# Patient Record
Sex: Female | Born: 1985 | Race: White | Hispanic: No | Marital: Married
Health system: Southern US, Academic
[De-identification: ages and names within clinical notes are randomized; demographics above are authoritative.]

## PROBLEM LIST (undated history)

## (undated) ENCOUNTER — Inpatient Hospital Stay: Payer: Self-pay

## (undated) ENCOUNTER — Encounter

## (undated) ENCOUNTER — Encounter: Attending: Family | Primary: Family

## (undated) ENCOUNTER — Ambulatory Visit: Payer: MEDICAID | Attending: Physical Medicine & Rehabilitation | Primary: Physical Medicine & Rehabilitation

## (undated) ENCOUNTER — Encounter: Attending: Surgery | Primary: Surgery

## (undated) ENCOUNTER — Ambulatory Visit: Payer: MEDICAID | Attending: Dermatology | Primary: Dermatology

## (undated) ENCOUNTER — Ambulatory Visit

## (undated) ENCOUNTER — Telehealth

## (undated) ENCOUNTER — Ambulatory Visit: Payer: MEDICAID | Attending: Obesity Medicine | Primary: Obesity Medicine

## (undated) ENCOUNTER — Telehealth: Attending: Dermatology | Primary: Dermatology

## (undated) ENCOUNTER — Encounter: Attending: Obesity Medicine | Primary: Obesity Medicine

## (undated) ENCOUNTER — Telehealth: Attending: Family Medicine | Primary: Family Medicine

## (undated) ENCOUNTER — Telehealth: Attending: Family | Primary: Family

## (undated) ENCOUNTER — Encounter: Attending: Ambulatory Care | Primary: Ambulatory Care

## (undated) ENCOUNTER — Institutional Professional Consult (permissible substitution): Payer: MEDICAID

## (undated) ENCOUNTER — Ambulatory Visit: Payer: MEDICAID

## (undated) ENCOUNTER — Ambulatory Visit
Payer: MEDICAID | Attending: Rehabilitative and Restorative Service Providers" | Primary: Rehabilitative and Restorative Service Providers"

## (undated) ENCOUNTER — Ambulatory Visit: Payer: MEDICAID | Attending: Gastroenterology | Primary: Gastroenterology

## (undated) ENCOUNTER — Inpatient Hospital Stay

## (undated) ENCOUNTER — Telehealth: Attending: Obesity Medicine | Primary: Obesity Medicine

## (undated) ENCOUNTER — Encounter: Attending: Physical Medicine & Rehabilitation | Primary: Physical Medicine & Rehabilitation

## (undated) ENCOUNTER — Ambulatory Visit: Payer: MEDICAID | Attending: Registered" | Primary: Registered"

## (undated) ENCOUNTER — Encounter: Attending: Physician Assistant | Primary: Physician Assistant

## (undated) DIAGNOSIS — I639 Cerebral infarction, unspecified: Secondary | ICD-10-CM

## (undated) DIAGNOSIS — M1990 Unspecified osteoarthritis, unspecified site: Secondary | ICD-10-CM

## (undated) DIAGNOSIS — F4310 Post-traumatic stress disorder, unspecified: Secondary | ICD-10-CM

## (undated) DIAGNOSIS — R51 Headache: Secondary | ICD-10-CM

## (undated) DIAGNOSIS — R0789 Other chest pain: Secondary | ICD-10-CM

## (undated) DIAGNOSIS — R112 Nausea with vomiting, unspecified: Secondary | ICD-10-CM

## (undated) DIAGNOSIS — R002 Palpitations: Secondary | ICD-10-CM

## (undated) DIAGNOSIS — R079 Chest pain, unspecified: Secondary | ICD-10-CM

## (undated) DIAGNOSIS — I493 Ventricular premature depolarization: Secondary | ICD-10-CM

## (undated) DIAGNOSIS — E785 Hyperlipidemia, unspecified: Secondary | ICD-10-CM

## (undated) DIAGNOSIS — M069 Rheumatoid arthritis, unspecified: Secondary | ICD-10-CM

## (undated) DIAGNOSIS — R87619 Unspecified abnormal cytological findings in specimens from cervix uteri: Secondary | ICD-10-CM

## (undated) DIAGNOSIS — R519 Headache, unspecified: Secondary | ICD-10-CM

## (undated) DIAGNOSIS — F4010 Social phobia, unspecified: Secondary | ICD-10-CM

## (undated) DIAGNOSIS — I10 Essential (primary) hypertension: Secondary | ICD-10-CM

## (undated) DIAGNOSIS — M461 Sacroiliitis, not elsewhere classified: Secondary | ICD-10-CM

## (undated) DIAGNOSIS — F319 Bipolar disorder, unspecified: Secondary | ICD-10-CM

## (undated) HISTORY — PX: CHOLECYSTECTOMY: SHX55

## (undated) HISTORY — DX: Rheumatoid arthritis, unspecified: M06.9

## (undated) HISTORY — DX: Hyperlipidemia, unspecified: E78.5

## (undated) HISTORY — DX: Bipolar disorder, unspecified: F31.9

## (undated) HISTORY — DX: Sacroiliitis, not elsewhere classified: M46.1

## (undated) HISTORY — DX: Cerebral infarction, unspecified: I63.9

## (undated) HISTORY — DX: Unspecified abnormal cytological findings in specimens from cervix uteri: R87.619

## (undated) HISTORY — DX: Post-traumatic stress disorder, unspecified: F43.10

## (undated) HISTORY — DX: Essential (primary) hypertension: I10

## (undated) HISTORY — DX: Chest pain, unspecified: R07.9

## (undated) HISTORY — DX: Unspecified osteoarthritis, unspecified site: M19.90

## (undated) HISTORY — DX: Palpitations: R00.2

## (undated) HISTORY — DX: Social phobia, unspecified: F40.10

---
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
## 2007-04-07 ENCOUNTER — Emergency Department: Payer: Self-pay | Admitting: Emergency Medicine

## 2007-06-13 ENCOUNTER — Emergency Department: Payer: Self-pay | Admitting: Emergency Medicine

## 2007-06-14 ENCOUNTER — Other Ambulatory Visit: Payer: Self-pay

## 2007-12-04 ENCOUNTER — Emergency Department: Payer: Self-pay | Admitting: Emergency Medicine

## 2008-03-03 ENCOUNTER — Emergency Department: Payer: Self-pay

## 2008-09-16 ENCOUNTER — Emergency Department: Payer: Self-pay

## 2008-10-22 ENCOUNTER — Emergency Department: Payer: Self-pay | Admitting: Emergency Medicine

## 2008-12-16 ENCOUNTER — Inpatient Hospital Stay: Payer: Self-pay | Admitting: Surgery

## 2010-03-13 ENCOUNTER — Emergency Department: Payer: Self-pay | Admitting: Emergency Medicine

## 2011-07-04 DIAGNOSIS — R8769 Abnormal cytological findings in specimens from other female genital organs: Secondary | ICD-10-CM | POA: Insufficient documentation

## 2011-07-19 ENCOUNTER — Emergency Department: Payer: Self-pay | Admitting: Emergency Medicine

## 2011-07-19 LAB — COMPREHENSIVE METABOLIC PANEL
Anion Gap: 8 (ref 7–16)
BUN: 7 mg/dL (ref 7–18)
Bilirubin,Total: 0.2 mg/dL (ref 0.2–1.0)
Chloride: 105 mmol/L (ref 98–107)
Creatinine: 0.6 mg/dL (ref 0.60–1.30)
EGFR (African American): >60
Osmolality: 277 (ref 275–301)
Potassium: 4.3 mmol/L (ref 3.5–5.1)
SGPT (ALT): 29 U/L
Sodium: 140 mmol/L (ref 136–145)
Total Protein: 7 g/dL (ref 6.4–8.2)

## 2011-07-19 LAB — CBC WITH DIFFERENTIAL/PLATELET
Basophil #: 0.1 10*3/uL (ref 0.0–0.1)
Basophil %: 0.4 %
HCT: 39.9 % (ref 35.0–47.0)
HGB: 13.6 g/dL (ref 12.0–16.0)
Lymphocyte #: 2.8 10*3/uL (ref 1.0–3.6)
MCH: 29.5 pg (ref 26.0–34.0)
MCHC: 34.2 g/dL (ref 32.0–36.0)
MCV: 86 fL (ref 80–100)
Monocyte %: 7.7 %
Neutrophil #: 10 10*3/uL — ABNORMAL HIGH (ref 1.4–6.5)
Platelet: 368 10*3/uL (ref 150–440)
RDW: 13.3 % (ref 11.5–14.5)

## 2011-07-20 LAB — WET PREP, GENITAL

## 2011-07-20 LAB — HCG, QUANTITATIVE, PREGNANCY: Beta Hcg, Quant.: 21189 m[IU]/mL — ABNORMAL HIGH

## 2012-11-14 LAB — HM PAP SMEAR

## 2014-07-28 ENCOUNTER — Emergency Department: Payer: Self-pay | Admitting: Emergency Medicine

## 2014-08-15 ENCOUNTER — Emergency Department: Payer: Self-pay | Admitting: Emergency Medicine

## 2015-01-04 ENCOUNTER — Ambulatory Visit (INDEPENDENT_AMBULATORY_CARE_PROVIDER_SITE_OTHER): Payer: 59 | Admitting: Family Medicine

## 2015-01-04 ENCOUNTER — Encounter: Payer: Self-pay | Admitting: Family Medicine

## 2015-01-04 VITALS — BP 112/72 | HR 80 | Temp 98.2°F | Resp 16 | Ht <= 58 in | Wt 256.0 lb

## 2015-01-04 DIAGNOSIS — J0110 Acute frontal sinusitis, unspecified: Secondary | ICD-10-CM | POA: Insufficient documentation

## 2015-01-04 DIAGNOSIS — J029 Acute pharyngitis, unspecified: Secondary | ICD-10-CM | POA: Insufficient documentation

## 2015-01-04 DIAGNOSIS — N912 Amenorrhea, unspecified: Secondary | ICD-10-CM | POA: Insufficient documentation

## 2015-01-04 DIAGNOSIS — J399 Disease of upper respiratory tract, unspecified: Secondary | ICD-10-CM | POA: Insufficient documentation

## 2015-01-04 DIAGNOSIS — J40 Bronchitis, not specified as acute or chronic: Secondary | ICD-10-CM

## 2015-01-04 DIAGNOSIS — D509 Iron deficiency anemia, unspecified: Secondary | ICD-10-CM | POA: Insufficient documentation

## 2015-01-04 DIAGNOSIS — G43909 Migraine, unspecified, not intractable, without status migrainosus: Secondary | ICD-10-CM | POA: Insufficient documentation

## 2015-01-04 DIAGNOSIS — Z3490 Encounter for supervision of normal pregnancy, unspecified, unspecified trimester: Secondary | ICD-10-CM

## 2015-01-04 DIAGNOSIS — H6120 Impacted cerumen, unspecified ear: Secondary | ICD-10-CM | POA: Insufficient documentation

## 2015-01-04 MED ORDER — ALBUTEROL SULFATE HFA 108 (90 BASE) MCG/ACT IN AERS: 2.0000 | INHALATION_SPRAY | Freq: Four times a day (QID) | RESPIRATORY_TRACT | Status: DC | PRN

## 2015-01-04 MED ORDER — AMOXICILLIN-POT CLAVULANATE 875-125 MG PO TABS: 1.0000 | ORAL_TABLET | Freq: Two times a day (BID) | ORAL | Status: DC

## 2015-01-04 NOTE — Progress Notes (Signed)
Subjective:    Patient ID: Leah Osborne, female    DOB: 10-Dec-1985, 29 y.o.   MRN: [id number]  HPI: Leah Osborne is a 29 y.o. female presenting on 01/04/2015 for Cough   HPI Pt presents for 7 days worth of upper respiratory symptoms. Symptoms include coughing and wheezing at home. Pt has not been using home treatment due to current pregnancy.    No past medical history on file.  No current outpatient prescriptions on file prior to visit.   No current facility-administered medications on file prior to visit.    Review of Systems  Constitutional: Negative for fever and chills.  HENT: Positive for sore throat. Negative for congestion, ear discharge, ear pain, rhinorrhea and sinus pressure.   Respiratory: Positive for cough, chest tightness and wheezing.   Cardiovascular: Negative for chest pain, palpitations and leg swelling.  Gastrointestinal: Negative.   Neurological: Negative.    Per HPI unless specifically indicated above     Objective:    BP 112/72 mmHg  Pulse 80  Temp(Src) 98.2 F (36.8 C) (Oral)  Resp 16  Ht '4\' 9"'  (1.448 m)  Wt 256 lb (116.121 kg)  BMI 55.38 kg/m2  SpO2 99%  Wt Readings from Last 3 Encounters:  01/04/15 256 lb (116.121 kg)    Physical Exam  Constitutional: She appears well-developed and well-nourished. No distress.  HENT:  Head: Normocephalic.  Right Ear: Hearing and tympanic membrane normal. Tympanic membrane is not erythematous and not bulging.  Left Ear: Hearing and tympanic membrane normal. Tympanic membrane is not erythematous and not bulging.  Nose: Mucosal edema present. No rhinorrhea, sinus tenderness or nasal septal hematoma. Right sinus exhibits no maxillary sinus tenderness and no frontal sinus tenderness. Left sinus exhibits no maxillary sinus tenderness and no frontal sinus tenderness.  Mouth/Throat: Uvula is midline and mucous membranes are normal. No uvula swelling. Posterior oropharyngeal erythema present. No posterior  oropharyngeal edema.  Neck: Neck supple. No Brudzinski's sign and no Kernig's sign noted.  Cardiovascular: Normal rate, regular rhythm and normal heart sounds.   Pulmonary/Chest: No accessory muscle usage. No tachypnea. No respiratory distress. She has wheezes in the right middle field, the right lower field, the left middle field and the left lower field. Chest wall is not dull to percussion. She exhibits no tenderness.  Lymphadenopathy:    She has no cervical adenopathy.   Results for orders placed or performed in visit on 07/19/11  Wet prep, genital  Result Value Ref Range   Micro Text Report         COMMENT                   MODERATE WHITE BLOOD CELLS SEEN   COMMENT                   MODERATE CLUE CELLS SEEN   COMMENT                   NO TRICHOMONAS SEEN   COMMENT                   NO SPERMATOZOA SEEN   COMMENT                   NO YEAST SEEN   ANTIBIOTIC  CBC with Differential/Platelet  Result Value Ref Range   WBC 14.1 (H) 3.6-11.0 x10 3/mm 3   RBC 4.63 3.80-5.20 X10 6/mm 3   HGB 13.6 12.0-16.0 g/dL   HCT 39.9 35.0-47.0 %   MCV 86 80-100 fL   MCH 29.5 26.0-34.0 pg   MCHC 34.2 32.0-36.0 g/dL   RDW 13.3 11.5-14.5 %   Platelet 368 150-440 x10 3/mm 3   Neutrophil % 70.9 %   Lymphocyte % 20.2 %   Monocyte % 7.7 %   Eosinophil % 0.8 %   Basophil % 0.4 %   Neutrophil # 10.0 (H) 1.4-6.5 x10 3/mm 3   Lymphocyte # 2.8 1.0-3.6 x10 3/mm 3   Monocyte # 1.1 (H) 0.0-0.7 x10 3/mm 3   Eosinophil # 0.1 0.0-0.7 x10 3/mm 3   Basophil # 0.1 0.0-0.1 x10 3/mm 3  Comprehensive metabolic panel  Result Value Ref Range   Glucose 84 65-99 mg/dL   BUN 7 7-18 mg/dL   Creatinine 0.60 0.60-1.30 mg/dL   Sodium 140 136-145 mmol/L   Potassium 4.3 3.5-5.1 mmol/L   Chloride 105 98-107 mmol/L   Co2 27 21-32 mmol/L   Calcium, Total 9.6 8.5-10.1 mg/dL   SGOT(AST) 19 15-37 Unit/L   SGPT (ALT) 29 U/L   Alkaline Phosphatase 63 50-136 Unit/L    Albumin 3.2 (L) 3.4-5.0 g/dL   Total Protein 7.0 6.4-8.2 g/dL   Bilirubin,Total 0.2 0.2-1.0 mg/dL   Osmolality 277 275-301   Anion Gap 8 7-16   EGFR (African American) >60 >82m/min   EGFR (Non-African Amer.) >60 >657mmin  hCG, quantitative, pregnancy  Result Value Ref Range   Beta Hcg, Quant. 21189 (H) mIU/mL      Assessment & Plan:   Problem List Items Addressed This Visit      Other   Currently pregnant    Other Visit Diagnoses    Bronchitis    -  Primary    Given current wheezing and duration of symptoms treat with augmentin. Category B in pregnancy. Albuterol inhaler for wheezing and chest tightness. Outlined possible pregnancy risk vs benefits. Encourage pt to discuss any OTC medications with her OB.      Relevant Medications    amoxicillin-clavulanate (AUGMENTIN) 875-125 MG per tablet    albuterol (PROVENTIL HFA;VENTOLIN HFA) 108 (90 BASE) MCG/ACT inhaler       Meds ordered this encounter  Medications  . Prenatal Vit-Fe Fumarate-FA (PRENATAL MULTIVITAMIN) TABS tablet    Sig: Take 1 tablet by mouth daily at 12 noon.  . Marland Kitchenmoxicillin-clavulanate (AUGMENTIN) 875-125 MG per tablet    Sig: Take 1 tablet by mouth 2 (two) times daily.    Dispense:  14 tablet    Refill:  0    Order Specific Question:  Supervising Provider    Answer:  HAArlis [phone]. albuterol (PROVENTIL HFA;VENTOLIN HFA) 108 (90 BASE) MCG/ACT inhaler    Sig: Inhale 2 puffs into the lungs every 6 (six) hours as needed for wheezing or shortness of breath.    Dispense:  1 Inhaler    Refill:  0    Order Specific Question:  Supervising Provider    Answer:  HAArlis [phone][[id number]]    Follow up plan: Return if symptoms worsen or fail to improve.

## 2015-01-04 NOTE — Patient Instructions (Signed)

## 2015-02-15 ENCOUNTER — Observation Stay
Admission: EM | Admit: 2015-02-15 | Discharge: 2015-02-15 | Disposition: A | Discharge status: Home / Self Care | Payer: Medicaid Other | Attending: Obstetrics and Gynecology | Admitting: Obstetrics and Gynecology

## 2015-02-15 DIAGNOSIS — O368120 Decreased fetal movements, second trimester, not applicable or unspecified: Principal | ICD-10-CM | POA: Insufficient documentation

## 2015-02-15 DIAGNOSIS — O368190 Decreased fetal movements, unspecified trimester, not applicable or unspecified: Secondary | ICD-10-CM | POA: Diagnosis present

## 2015-02-15 NOTE — OB Triage Note (Signed)
Pt here due to decreased fetal movement. Pt receives prenatal care at [facility]. Pt here for reassurance

## 2015-02-15 NOTE — Plan of Care (Signed)
MONITOR STRIP REVIEWED BY C GUTIERREZ,CNM. PT NOW FEELING FETAL MOVEMENT. PT D/C HOME WITH D/C INSTRUCTIONS AND REASSURANCE

## 2015-04-08 ENCOUNTER — Encounter: Payer: Self-pay | Admitting: *Deleted

## 2015-04-08 ENCOUNTER — Observation Stay
Admission: EM | Admit: 2015-04-08 | Discharge: 2015-04-08 | Disposition: A | Discharge status: Home / Self Care | Payer: Medicaid Other | Attending: Obstetrics and Gynecology | Admitting: Obstetrics and Gynecology

## 2015-04-08 DIAGNOSIS — O26899 Other specified pregnancy related conditions, unspecified trimester: Secondary | ICD-10-CM | POA: Diagnosis present

## 2015-04-08 DIAGNOSIS — R42 Dizziness and giddiness: Secondary | ICD-10-CM | POA: Diagnosis not present

## 2015-04-08 LAB — HEPATIC FUNCTION PANEL
ALK PHOS: 77 U/L (ref 38–126)
ALT: 18 U/L (ref 14–54)
AST: 15 U/L (ref 15–41)
Albumin: 3.1 g/dL — ABNORMAL LOW (ref 3.5–5.0)
BILIRUBIN TOTAL: 0.2 mg/dL — AB (ref 0.3–1.2)
Bilirubin, Direct: <0.1 mg/dL — ABNORMAL LOW (ref 0.1–0.5)
Total Protein: 6.6 g/dL (ref 6.5–8.1)

## 2015-04-08 LAB — BASIC METABOLIC PANEL
Anion gap: 8 (ref 5–15)
BUN: 8 mg/dL (ref 6–20)
CALCIUM: 8.8 mg/dL — AB (ref 8.9–10.3)
CHLORIDE: 104 mmol/L (ref 101–111)
CO2: 24 mmol/L (ref 22–32)
CREATININE: 0.61 mg/dL (ref 0.44–1.00)
GFR calc non Af Amer: >60 mL/min (ref >60)
GLUCOSE: 80 mg/dL (ref 65–99)
Potassium: 4.3 mmol/L (ref 3.5–5.1)
Sodium: 136 mmol/L (ref 135–145)

## 2015-04-08 LAB — PROTEIN / CREATININE RATIO, URINE
CREATININE, URINE: 92 mg/dL
Protein Creatinine Ratio: 0.08 mg/mg{Cre} (ref 0.00–0.15)
Total Protein, Urine: 7 mg/dL

## 2015-04-08 LAB — CBC
HCT: 35.7 % (ref 35.0–47.0)
Hemoglobin: 11.8 g/dL — ABNORMAL LOW (ref 12.0–16.0)
MCH: 28.4 pg (ref 26.0–34.0)
MCHC: 33 g/dL (ref 32.0–36.0)
MCV: 86 fL (ref 80.0–100.0)
PLATELETS: 347 10*3/uL (ref 150–440)
RBC: 4.15 MIL/uL (ref 3.80–5.20)
RDW: 13.5 % (ref 11.5–14.5)
WBC: 11.2 10*3/uL — ABNORMAL HIGH (ref 3.6–11.0)

## 2015-04-08 NOTE — Discharge Instructions (Signed)
Drink plenty of fluid and get plenty of rest. Call your provider for any other concerns °

## 2015-04-08 NOTE — Discharge Summary (Signed)
Patient discharged home, discharge instructions given, patient states understanding. Patient left floor in stable condition, denies any other needs at this time. Patient to schedule follow up next week.

## 2015-04-08 NOTE — OB Triage Note (Signed)
Patient complains on dizziness and sudden increase in HA starting this AM

## 2015-08-01 ENCOUNTER — Encounter: Payer: Self-pay | Admitting: Family Medicine

## 2015-08-01 ENCOUNTER — Ambulatory Visit (INDEPENDENT_AMBULATORY_CARE_PROVIDER_SITE_OTHER): Payer: Medicaid Other | Admitting: Family Medicine

## 2015-08-01 VITALS — BP 135/84 | HR 69 | Temp 97.6°F | Resp 16 | Ht <= 58 in | Wt 275.0 lb

## 2015-08-01 DIAGNOSIS — Z3009 Encounter for other general counseling and advice on contraception: Secondary | ICD-10-CM | POA: Diagnosis not present

## 2015-08-01 NOTE — Progress Notes (Signed)
Subjective:    Patient ID: Leah Osborne, female    DOB: [birth year], 30 y.o.   MRN: [id number]  HPI: Leah Osborne is a 30 y.o. female presenting on 08/01/2015 for Pre-op Exam   HPI  Pt presents for referral to GYN. Desires tubal ligation. Recently delivered at [facility]. 7 weeks post-partum. Not breastfeeding. Just had LMP- 07/25/2014. First since birth.    No past medical history on file.  Current Outpatient Prescriptions on File Prior to Visit  Medication Sig  . albuterol (PROVENTIL HFA;VENTOLIN HFA) 108 (90 BASE) MCG/ACT inhaler Inhale 2 puffs into the lungs every 6 (six) hours as needed for wheezing or shortness of breath.   No current facility-administered medications on file prior to visit.    Review of Systems  Constitutional: Negative for fever and chills.  HENT: Negative.   Respiratory: Negative for cough, chest tightness and wheezing.   Cardiovascular: Negative for chest pain and leg swelling.  Gastrointestinal: Negative for nausea, vomiting, abdominal pain, diarrhea and constipation.  Endocrine: Negative.  Negative for cold intolerance, heat intolerance, polydipsia, polyphagia and polyuria.  Genitourinary: Negative for dysuria and difficulty urinating.  Musculoskeletal: Negative.   Neurological: Negative for dizziness, light-headedness and numbness.  Psychiatric/Behavioral: Negative.    Per HPI unless specifically indicated above     Objective:    BP 135/84 mmHg  Pulse 69  Temp(Src) 97.6 F (36.4 C) (Oral)  Resp 16  Ht 4\' 9"  (1.448 m)  Wt 275 lb (124.739 kg)  BMI 59.49 kg/m2  LMP 07/25/2014  Breastfeeding? No  Wt Readings from Last 3 Encounters:  08/01/15 275 lb (124.739 kg)  01/04/15 256 lb (116.121 kg)    Physical Exam  Constitutional: She is oriented to person, place, and time. She appears well-developed and well-nourished.  HENT:  Head: Normocephalic and atraumatic.  Neck: Neck supple.  Cardiovascular: Normal rate, regular rhythm and normal heart  sounds.  Exam reveals no gallop and no friction rub.   No murmur heard. Pulmonary/Chest: Effort normal and breath sounds normal. She has no wheezes. She exhibits no tenderness.  Abdominal: Soft. Normal appearance and bowel sounds are normal. She exhibits no distension and no mass. There is no tenderness. There is no rebound and no guarding.  Musculoskeletal: Normal range of motion. She exhibits no edema or tenderness.  Lymphadenopathy:    She has no cervical adenopathy.  Neurological: She is alert and oriented to person, place, and time.  Skin: Skin is warm and dry.   Results for orders placed or performed during the hospital encounter of [id number]  Basic metabolic panel  Result Value Ref Range   Sodium 136 135 - 145 mmol/L   Potassium 4.3 3.5 - 5.1 mmol/L   Chloride 104 101 - 111 mmol/L   CO2 24 22 - 32 mmol/L   Glucose, Bld 80 65 - 99 mg/dL   BUN 8 6 - 20 mg/dL   Creatinine, Ser 0.61 0.44 - 1.00 mg/dL   Calcium 8.8 (L) 8.9 - 10.3 mg/dL   GFR calc non Af Amer >60 >60 mL/min   GFR calc Af Amer >60 >60 mL/min   Anion gap 8 5 - 15  CBC  Result Value Ref Range   WBC 11.2 (H) 3.6 - 11.0 K/uL   RBC 4.15 3.80 - 5.20 MIL/uL   Hemoglobin 11.8 (L) 12.0 - 16.0 g/dL   HCT 35.7 35.0 - 47.0 %   MCV 86.0 80.0 - 100.0 fL   MCH 28.4 26.0 - 34.0 pg  MCHC 33.0 32.0 - 36.0 g/dL   RDW 13.5 11.5 - 14.5 %   Platelets 347 150 - 440 K/uL  Protein / creatinine ratio, urine  Result Value Ref Range   Creatinine, Urine 92 mg/dL   Total Protein, Urine 7 mg/dL   Protein Creatinine Ratio 0.08 0.00 - 0.15 mg/mg[Cre]  Hepatic function panel  Result Value Ref Range   Total Protein 6.6 6.5 - 8.1 g/dL   Albumin 3.1 (L) 3.5 - 5.0 g/dL   AST 15 15 - 41 U/L   ALT 18 14 - 54 U/L   Alkaline Phosphatase 77 38 - 126 U/L   Total Bilirubin 0.2 (L) 0.3 - 1.2 mg/dL   Bilirubin, Direct <0.1 (L) 0.1 - 0.5 mg/dL   Indirect Bilirubin NOT CALCULATED 0.3 - 0.9 mg/dL      Assessment & Plan:   Problem List Items  Addressed This Visit    None    Visit Diagnoses    General counseling and advice on contraceptive management    -  Primary    Referral to GYN for desired tubal ligation.     Relevant Orders    Ambulatory referral to Obstetrics / Gynecology       Meds ordered this encounter  Medications  . ibuprofen (ADVIL,MOTRIN) 600 MG tablet    Sig: Take 600 mg by mouth.      Follow up plan: Return if symptoms worsen or fail to improve.

## 2015-08-01 NOTE — Patient Instructions (Signed)
Laparoscopic Tubal Ligation Laparoscopic tubal ligation is a procedure that closes the fallopian tubes at a time other than right after childbirth. When the fallopian tubes are closed, the eggs that are released from the ovaries cannot enter the uterus, and sperm cannot reach the egg. Tubal ligation is also known as getting your "tubes tied." Tubal ligation is done so you will not be able to get pregnant or have a baby. Although this procedure may be undone (reversed), it should be considered permanent and irreversible. If you want to have future pregnancies, you should not have this procedure. LET [facility] CARE PROVIDER KNOW ABOUT:  Any allergies you have.  All medicines you are taking, including vitamins, herbs, eye drops, creams, and over-the-counter medicines. This includes any use of steroids, either by mouth or in cream form.  Previous problems you or members of your family have had with the use of anesthetics.  Any blood disorders you have.  Previous surgeries you have had.  Any medical conditions you may have.  Possibility of pregnancy, if this applies.  Any past pregnancies. RISKS AND COMPLICATIONS  Infection.  Bleeding.  Injury to surrounding organs.  Side effects from anesthetics.  Failure of the procedure.  Ectopic pregnancy.  Future regret about having the procedure done. BEFORE THE PROCEDURE  Ask your health care provider about:  Changing or stopping your regular medicines. This is especially important if you are taking diabetes medicines or blood thinners.  Taking medicines such as aspirin and ibuprofen. These medicines can thin your blood. Do not take these medicines before your procedure if your health care provider instructs you not to.  Follow instructions from your health care provider about eating and drinking restrictions.  Plan to have someone take you home after the procedure.  If you go home right after the procedure, plan to have someone  with you for 24 hours. PROCEDURE  You will be given one or more of the following:  A medicine that helps you relax (sedative).  A medicine that numbs the area (local anesthetic).  A medicine that makes you fall asleep (general anesthetic).  A medicine that is injected into an area of your body that numbs everything below the injection site (regional anesthetic).  If you have been given general anesthetic, a tube will be put down your throat to help you breathe.  Two small cuts (incisions) will be made in the lower abdominal area and near the belly button.  Your bladder may be emptied with a small tube (catheter).  Your abdomen will be inflated with a safe gas (carbon dioxide). This will help to give the surgeon room to operate and visualize, and it will help the surgeon to avoid other organs.  A thin, lighted tube (laparoscope) with a camera attached will be inserted into your abdomen through one of the incisions near the belly button. Other small instruments will be inserted through the other abdominal incision.  The fallopian tubes will be tied off or burned (cauterized), or they will be blocked with a clip, ring, or clamp. In many cases, a small portion in the center of each fallopian tube will also be removed.  After the fallopian tubes are blocked, the gas will be released from the abdomen.  The incisions will be closed with stitches (sutures).  A bandage (dressing) will be placed over the incisions. The procedure may vary among health care providers and hospitals. AFTER THE PROCEDURE  Your blood pressure, heart rate, breathing rate, and blood oxygen level  will be monitored often until the medicines you were given have worn off.  You will be given pain medicine as needed.  If you had general anesthetic, you may have some mild discomfort in your throat. This is from the breathing tube that was placed in your throat while you were sleeping.  You may experience discomfort in  the shoulder area from some trapped air between your liver and your diaphragm. This sensation is normal, and it will slowly go away on its own.  You will have some mild abdominal discomfort for 3--7 days.   This information is not intended to replace advice given to you by your health care provider. Make sure you discuss any questions you have with your health care provider.   Document Released: 09/10/2000 Document Revised: 10/19/2014 Document Reviewed: 09/15/2011 Elsevier Interactive Patient Education [employer].

## 2015-08-23 ENCOUNTER — Ambulatory Visit (INDEPENDENT_AMBULATORY_CARE_PROVIDER_SITE_OTHER): Payer: Medicaid Other | Admitting: Obstetrics and Gynecology

## 2015-08-23 ENCOUNTER — Encounter: Payer: Self-pay | Admitting: Obstetrics and Gynecology

## 2015-08-23 VITALS — BP 134/82 | HR 84 | Ht 60.0 in | Wt 269.7 lb

## 2015-08-23 DIAGNOSIS — Z302 Encounter for sterilization: Secondary | ICD-10-CM | POA: Diagnosis not present

## 2015-08-23 NOTE — Progress Notes (Signed)
GYN ENCOUNTER NOTE  Subjective:       Leah Osborne is a 30 y.o. G62P2002 female is here for gynecologic evaluation of the following issues:  1. Permanent sterilization  The patient is a 89 year old infant engaged, white, female, para 2002, who presents for consultation regarding  Surgical sterilization.  Patient had previously been scheduled for bilateral salpingectomy at [facility] for: Procedure was discontinued because of uncertain indications.-Provider reportedly had no opening for her surgery.  Past obstetric history: Para 2002. G1-SVD, 5 lbs. 14 oz.;; preeclampsia complication. G2 SVD 8 lbs. 1 oz.; no complications.  (Different father).  .     Gynecologic History Patient's last menstrual period was 07/26/2015 (approximate). Menarche-age 23. Cycles regular on a monthly basis. Duration of flow 5-7 days, moderate bleeding. History of severe dysmenorrhea; pain management with ibuprofen 600 mg per day; patient has missed work and school in the past because of severe cramps. Dysmenorrhea is characterized as central cramping along with low back pain without radiation. No history of deep thrusting dyspareunia. Family history of sister with severe dysmenorrhea without diagnosis of endometriosis. History of abnormal Pap smear No STI history  Obstetric History OB History  Gravida Para Term Preterm AB SAB TAB Ectopic Multiple Living  2 2 2  0 0 0 0 0 0 2    # Outcome Date GA Lbr Len/2nd Weight Sex Delivery Anes PTL Lv  2 Term 2016   8 lb 1.6 oz (3.674 kg) F Vag-Spont   Y  1 Term 2013   5 lb 2.2 oz (2.331 kg) M Vag-Spont   Y      Past Medical History  Diagnosis Date  . Abnormal Pap smear of cervix     Past Surgical History  Procedure Laterality Date  . Cholecystectomy      Current Outpatient Prescriptions on File Prior to Visit  Medication Sig Dispense Refill  . ibuprofen (ADVIL,MOTRIN) 600 MG tablet Take 600 mg by mouth.     No current facility-administered  medications on file prior to visit.    No Known Allergies  Social History   Social History  . Marital Status: Single    Spouse Name: N/A  . Number of Children: N/A  . Years of Education: N/A   Occupational History  . Not on file.   Social History Main Topics  . Smoking status: Never Smoker   . Smokeless tobacco: Never Used  . Alcohol Use: No  . Drug Use: No  . Sexual Activity: Yes    Birth Control/ Protection: Condom   Other Topics Concern  . Not on file   Social History Narrative    Family History  Problem Relation Age of Onset  . Diabetes Maternal Grandmother   . Heart disease Maternal Grandmother   . Cancer Neg Hx     The following portions of the patient's history were reviewed and updated as appropriate: allergies, current medications, past family history, past medical history, past social history, past surgical history and problem list.  Review of Systems Review of Systems - General ROS: negative for - chills, fatigue, fever, hot flashes, malaise or night sweats Hematological and Lymphatic ROS: negative for - bleeding problems or swollen lymph nodes Gastrointestinal ROS: negative for - abdominal pain, blood in stools, change in bowel habits and nausea/vomiting Musculoskeletal ROS: negative for - joint pain, muscle pain or muscular weakness Genito-Urinary ROS: negative for - change in menstrual cycle, , dyspareunia, dysuria, genital discharge, genital ulcers, hematuria, incontinence, irregular/heavy menses, nocturia.Marland Kitchen  POSITIVE-dysmenorrhea  Objective:   BP 134/82 mmHg  Pulse 84  Ht 5' (1.524 m)  Wt 269 lb 11.2 oz (122.335 kg)  BMI 52.67 kg/m2  LMP 07/26/2015 (Approximate)  Breastfeeding? No Physical exam-deferred   Assessment:   1.  Desires permanent sterilization. 2.  History of dysmenorrhea   Plan:   1.  Laparoscopic bilateral tubal banding. 2.  Options of permanent sterilization were reviewed including bilateral salpingectomy versus BPS  versus BTB; benefits and risks of each were reviewed. 3.  Return for preop appointment prior to surgery  A total of 30 minutes were spent face-to-face with the patient during the encounter with greater than 50% dealing with counseling and coordination of care.  Brayton Mars, MD  Note: This dictation was prepared with Dragon dictation along with smaller phrase technology. Any transcriptional errors that result from this process are unintentional.

## 2015-08-23 NOTE — Patient Instructions (Signed)
1.  Laparoscopic sterilization is scheduled 2.  Return for preoperative appointment

## 2015-09-13 ENCOUNTER — Encounter: Payer: Self-pay | Admitting: *Deleted

## 2015-09-13 ENCOUNTER — Encounter: Payer: Self-pay | Admitting: Obstetrics and Gynecology

## 2015-09-13 ENCOUNTER — Ambulatory Visit (INDEPENDENT_AMBULATORY_CARE_PROVIDER_SITE_OTHER): Payer: Medicaid Other | Admitting: Obstetrics and Gynecology

## 2015-09-13 ENCOUNTER — Other Ambulatory Visit: Payer: Medicaid Other

## 2015-09-13 VITALS — BP 132/86 | HR 65 | Ht 60.0 in | Wt 275.9 lb

## 2015-09-13 DIAGNOSIS — Z302 Encounter for sterilization: Secondary | ICD-10-CM

## 2015-09-13 DIAGNOSIS — Z01818 Encounter for other preprocedural examination: Secondary | ICD-10-CM

## 2015-09-13 NOTE — Patient Instructions (Addendum)
  Your procedure is scheduled on: 09-19-15 (MONDAY) Report to [place] To find out your arrival time please call [phone] between 1PM - 3PM on 09-16-15 (FRIDAY)  Remember: Instructions that are not followed completely may result in serious medical risk, up to and including death, or upon the discretion of your surgeon and anesthesiologist your surgery may need to be rescheduled.    _X___ 1. Do not eat food or drink liquids after midnight. No gum chewing or hard candies.     _X___ 2. No Alcohol for 24 hours before or after surgery.   ____ 3. Bring all medications with you on the day of surgery if instructed.    _X___ 4. Notify your doctor if there is any change in your medical condition     (cold, fever, infections).     Do not wear jewelry, make-up, hairpins, clips or nail polish.  Do not wear lotions, powders, or perfumes. You may wear deodorant.  Do not shave 48 hours prior to surgery. Men may shave face and neck.  Do not bring valuables to the hospital.    [facility] is not responsible for any belongings or valuables.               Contacts, dentures or bridgework may not be worn into surgery.  Leave your suitcase in the car. After surgery it may be brought to your room.  For patients admitted to the hospital, discharge time is determined by your treatment team.   Patients discharged the day of surgery will not be allowed to drive home.   Please read over the following fact sheets that you were given:      ____ Take these medicines the morning of surgery with A SIP OF WATER:    1. NONE  2.   3.   4.  5.  6.  ____ Fleet Enema (as directed)   _X___ Use CHG Soap as directed  ____ Use inhalers on the day of surgery  ____ Stop metformin 2 days prior to surgery    ____ Take 1/2 of usual insulin dose the night before surgery and none on the morning of surgery.   ____ Stop Coumadin/Plavix/aspirin-N/A  _X___ Stop Anti-inflammatories-STOP  IBUPROFEN NOW-NO NSAIDS OR ASA PRODUCTS-TYLENOL OK TO TAKE   ____ Stop supplements until after surgery.    ____ Bring C-Pap to the hospital.

## 2015-09-13 NOTE — Progress Notes (Signed)
Subjective:  PREOPERATIVE HISTORY AND PHYSICAL     Date of surgery: 09/19/2015 Chief complaint: Desires sterilization   Patient is a 30 y.o. G2P20103female scheduled laparoscopic tubal banding for permanent sterilization.  Past obstetric history: Para 2002. G1-SVD, 5 lbs. 14 oz.;; preeclampsia complication. G2 SVD 8 lbs. 1 oz.; no complications. (Different father).  .   Gynecologic History Patient's last menstrual period was 07/26/2015 (approximate). Menarche-age 79. Cycles regular on a monthly basis. Duration of flow 5-7 days, moderate bleeding. History of severe dysmenorrhea; pain management with ibuprofen 600 mg per day; patient has missed work and school in the past because of severe cramps. Dysmenorrhea is characterized as central cramping along with low back pain without radiation. No history of deep thrusting dyspareunia. Family history of sister with severe dysmenorrhea without diagnosis of endometriosis. History of abnormal Pap smear No STI history    Menstrual History: OB History    Gravida Para Term Preterm AB TAB SAB Ectopic Multiple Living   2 2 2  0 0 0 0 0 0 2      Menarche age: NA Patient's last menstrual period was 08/26/2015.    Past Medical History  Diagnosis Date  . Abnormal Pap smear of cervix     Past Surgical History  Procedure Laterality Date  . Cholecystectomy      OB History  Gravida Para Term Preterm AB SAB TAB Ectopic Multiple Living  2 2 2  0 0 0 0 0 0 2    # Outcome Date GA Lbr Len/2nd Weight Sex Delivery Anes PTL Lv  2 Term 2016   8 lb 1.6 oz (3.674 kg) F Vag-Spont   Y  1 Term 2013   5 lb 2.2 oz (2.331 kg) M Vag-Spont   Y      Social History   Social History  . Marital Status: Single    Spouse Name: N/A  . Number of Children: N/A  . Years of Education: N/A   Social History Main Topics  . Smoking status: Never Smoker   . Smokeless tobacco: Never Used  . Alcohol Use: No  . Drug Use: No  . Sexual Activity: Yes   Birth Control/ Protection: Condom   Other Topics Concern  . None   Social History Narrative    Family History  Problem Relation Age of Onset  . Diabetes Maternal Grandmother   . Heart disease Maternal Grandmother   . Cancer Neg Hx      (Not in a hospital admission)  No Known Allergies  Review of Systems Constitutional: No recent fever/chills/sweats Respiratory: No recent cough/bronchitis Cardiovascular: No chest pain Gastrointestinal: No recent nausea/vomiting/diarrhea Genitourinary: No UTI symptoms Hematologic/lymphatic:No history of coagulopathy or recent blood thinner use    Objective:    BP 132/86 mmHg  Pulse 65  Ht 5' (1.524 m)  Wt 275 lb 14.4 oz (125.147 kg)  BMI 53.88 kg/m2  LMP 08/26/2015  Breastfeeding? No  General:   Normal  Skin:   normal  HEENT:  Normal  Neck:  Supple without Adenopathy or Thyromegaly  Lungs:   Heart:              Breasts:   Abdomen:  Pelvis:  M/S   Extremeties:  Neuro:    clear to auscultation bilaterally   Normal without murmur   Not Examined   soft, non-tender; bowel sounds normal; no masses,  no organomegaly   Exam deferred to OR  No CVAT  Warm/Dry   Normal  Assessment:    Desires sterilization   Plan:  Laparoscopic bilateral tubal banding   Preop counseling: The patient is to undergo laparoscopic bilateral tubal banding on 09/19/2015. She is understanding of the planned procedure and is aware of and is accepting of all surgical risks which include but are not limited to bleeding, infection, pelvic organ injury with need for repair, blood clot disorders, anesthesia risks, failure rate of 1 out of 300. All questions have been answered. Informed consent is given. Patient is ready and willing to proceed with surgery as scheduled.  Brayton Mars, MD  Note: This dictation was prepared with Dragon dictation along with smaller phrase technology. Any transcriptional errors that result from this process  are unintentional.

## 2015-09-13 NOTE — H&P (Signed)
Subjective:  PREOPERATIVE HISTORY AND PHYSICAL     Date of surgery: 09/19/2015 Chief complaint: Desires sterilization   Patient is a 30 y.o. G2P2011female scheduled laparoscopic tubal banding for permanent sterilization.  Past obstetric history: Para 2002. G1-SVD, 5 lbs. 14 oz.;; preeclampsia complication. G2 SVD 8 lbs. 1 oz.; no complications. (Different father).  .   Gynecologic History Patient's last menstrual period was 07/26/2015 (approximate). Menarche-age 72. Cycles regular on a monthly basis. Duration of flow 5-7 days, moderate bleeding. History of severe dysmenorrhea; pain management with ibuprofen 600 mg per day; patient has missed work and school in the past because of severe cramps. Dysmenorrhea is characterized as central cramping along with low back pain without radiation. No history of deep thrusting dyspareunia. Family history of sister with severe dysmenorrhea without diagnosis of endometriosis. History of abnormal Pap smear No STI history    Menstrual History: OB History    Gravida Para Term Preterm AB TAB SAB Ectopic Multiple Living   2 2 2  0 0 0 0 0 0 2      Menarche age:NA Patient's last menstrual period was 08/26/2015.    Past Medical History  Diagnosis Date  . Abnormal Pap smear of cervix     Past Surgical History  Procedure Laterality Date  . Cholecystectomy      OB History  Gravida Para Term Preterm AB SAB TAB Ectopic Multiple Living  2 2 2  0 0 0 0 0 0 2    # Outcome Date GA Lbr Len/2nd Weight Sex Delivery Anes PTL Lv  2 Term 2016   8 lb 1.6 oz (3.674 kg) F Vag-Spont   Y  1 Term 2013   5 lb 2.2 oz (2.331 kg) M Vag-Spont   Y      Social History   Social History  . Marital Status: Single    Spouse Name: N/A  . Number of Children: N/A  . Years of Education: N/A   Social History Main Topics  . Smoking status: Never Smoker   . Smokeless tobacco: Never Used  . Alcohol Use: No  . Drug Use: No  . Sexual Activity: Yes    Birth  Control/ Protection: Condom   Other Topics Concern  . None   Social History Narrative    Family History  Problem Relation Age of Onset  . Diabetes Maternal Grandmother   . Heart disease Maternal Grandmother   . Cancer Neg Hx      (Not in a hospital admission)  No Known Allergies  Review of Systems Constitutional: No recent fever/chills/sweats Respiratory: No recent cough/bronchitis Cardiovascular: No chest pain Gastrointestinal: No recent nausea/vomiting/diarrhea Genitourinary: No UTI symptoms Hematologic/lymphatic:No history of coagulopathy or recent blood thinner use    Objective:    BP 132/86 mmHg  Pulse 65  Ht 5' (1.524 m)  Wt 275 lb 14.4 oz (125.147 kg)  BMI 53.88 kg/m2  LMP 08/26/2015  Breastfeeding? No  General:   Normal  Skin:   normal  HEENT:  Normal  Neck:  Supple without Adenopathy or Thyromegaly  Lungs:   Heart:              Breasts:   Abdomen:  Pelvis:  M/S   Extremeties:  Neuro:    clear to auscultation bilaterally   Normal without murmur   Not Examined   soft, non-tender; bowel sounds normal; no masses,  no organomegaly   Exam deferred to OR  No CVAT  Warm/Dry   Normal  Assessment:    Desires sterilization   Plan:  Laparoscopic bilateral tubal banding   Preop counseling: The patient is to undergo laparoscopic bilateral tubal banding on 09/19/2015. She is understanding of the planned procedure and is aware of and is accepting of all surgical risks which include but are not limited to bleeding, infection, pelvic organ injury with need for repair, blood clot disorders, anesthesia risks, failure rate of 1 out of 300. All questions have been answered. Informed consent is given. Patient is ready and willing to proceed with surgery as scheduled.  Brayton Mars, MD  Note: This dictation was prepared with Dragon dictation along with smaller phrase technology. Any transcriptional errors that result from this process are  unintentional.

## 2015-09-13 NOTE — Patient Instructions (Addendum)
1. Return as scheduled for postop for evaluation

## 2015-09-14 ENCOUNTER — Encounter
Admission: RE | Admit: 2015-09-14 | Discharge: 2015-09-14 | Disposition: A | Discharge status: Home / Self Care | Payer: Medicaid Other | Source: Ambulatory Visit | Attending: Obstetrics and Gynecology | Admitting: Obstetrics and Gynecology

## 2015-09-14 DIAGNOSIS — Z01812 Encounter for preprocedural laboratory examination: Secondary | ICD-10-CM | POA: Diagnosis not present

## 2015-09-14 LAB — CBC WITH DIFFERENTIAL/PLATELET
BASOS PCT: 0 %
Basophils Absolute: 0 10*3/uL (ref 0–0.1)
EOS ABS: 0.2 10*3/uL (ref 0–0.7)
Eosinophils Relative: 2 %
HCT: 36.3 % (ref 35.0–47.0)
HEMOGLOBIN: 12.1 g/dL (ref 12.0–16.0)
Lymphocytes Relative: 23 %
Lymphs Abs: 2.3 10*3/uL (ref 1.0–3.6)
MCH: 25.9 pg — ABNORMAL LOW (ref 26.0–34.0)
MCHC: 33.4 g/dL (ref 32.0–36.0)
MCV: 77.4 fL — ABNORMAL LOW (ref 80.0–100.0)
MONOS PCT: 8 %
Monocytes Absolute: 0.8 10*3/uL (ref 0.2–0.9)
NEUTROS PCT: 67 %
Neutro Abs: 6.7 10*3/uL — ABNORMAL HIGH (ref 1.4–6.5)
PLATELETS: 424 10*3/uL (ref 150–440)
RBC: 4.69 MIL/uL (ref 3.80–5.20)
RDW: 16.5 % — ABNORMAL HIGH (ref 11.5–14.5)
WBC: 10 10*3/uL (ref 3.6–11.0)

## 2015-09-14 LAB — RAPID HIV SCREEN (HIV 1/2 AB+AG)
HIV 1/2 ANTIBODIES: NONREACTIVE
HIV-1 P24 ANTIGEN - HIV24: NONREACTIVE

## 2015-09-15 LAB — RPR: RPR: NONREACTIVE

## 2015-09-19 ENCOUNTER — Ambulatory Visit: Payer: Medicaid Other | Admitting: Anesthesiology

## 2015-09-19 ENCOUNTER — Ambulatory Visit
Admission: RE | Admit: 2015-09-19 | Discharge: 2015-09-19 | Disposition: A | Discharge status: Home / Self Care | Payer: Medicaid Other | Source: Ambulatory Visit | Attending: Obstetrics and Gynecology | Admitting: Obstetrics and Gynecology

## 2015-09-19 ENCOUNTER — Encounter: Payer: Self-pay | Admitting: *Deleted

## 2015-09-19 ENCOUNTER — Encounter
Admission: RE | Disposition: A | Discharge status: Home / Self Care | Payer: Self-pay | Source: Ambulatory Visit | Attending: Obstetrics and Gynecology

## 2015-09-19 DIAGNOSIS — Z833 Family history of diabetes mellitus: Secondary | ICD-10-CM | POA: Diagnosis not present

## 2015-09-19 DIAGNOSIS — Z302 Encounter for sterilization: Secondary | ICD-10-CM

## 2015-09-19 DIAGNOSIS — M545 Low back pain: Secondary | ICD-10-CM | POA: Insufficient documentation

## 2015-09-19 DIAGNOSIS — R51 Headache: Secondary | ICD-10-CM | POA: Diagnosis not present

## 2015-09-19 DIAGNOSIS — Z8249 Family history of ischemic heart disease and other diseases of the circulatory system: Secondary | ICD-10-CM | POA: Insufficient documentation

## 2015-09-19 DIAGNOSIS — Z9049 Acquired absence of other specified parts of digestive tract: Secondary | ICD-10-CM | POA: Diagnosis not present

## 2015-09-19 DIAGNOSIS — F419 Anxiety disorder, unspecified: Secondary | ICD-10-CM | POA: Insufficient documentation

## 2015-09-19 DIAGNOSIS — Z6841 Body Mass Index (BMI) 40.0 and over, adult: Secondary | ICD-10-CM | POA: Diagnosis not present

## 2015-09-19 DIAGNOSIS — N946 Dysmenorrhea, unspecified: Secondary | ICD-10-CM | POA: Diagnosis not present

## 2015-09-19 DIAGNOSIS — Z842 Family history of other diseases of the genitourinary system: Secondary | ICD-10-CM | POA: Diagnosis not present

## 2015-09-19 HISTORY — DX: Headache: R51

## 2015-09-19 HISTORY — PX: LAPAROSCOPIC TUBAL LIGATION: SHX1937

## 2015-09-19 HISTORY — DX: Headache, unspecified: R51.9

## 2015-09-19 LAB — POCT PREGNANCY, URINE: PREG TEST UR: NEGATIVE

## 2015-09-19 SURGERY — LIGATION, FALLOPIAN TUBE, LAPAROSCOPIC
Anesthesia: General | Laterality: Bilateral

## 2015-09-19 MED ORDER — MIDAZOLAM HCL 2 MG/2ML IJ SOLN
INTRAMUSCULAR | Status: DC | PRN
  Administered 2015-09-19: 1 mg via INTRAVENOUS
  Administered 2015-09-19: 2 mg via INTRAVENOUS

## 2015-09-19 MED ORDER — FENTANYL CITRATE (PF) 100 MCG/2ML IJ SOLN
INTRAMUSCULAR | Status: AC
  Administered 2015-09-19: 25 ug via INTRAVENOUS
  Filled 2015-09-19: qty 2

## 2015-09-19 MED ORDER — ONDANSETRON HCL 4 MG/2ML IJ SOLN
INTRAMUSCULAR | Status: AC
  Administered 2015-09-19: 4 mg
  Filled 2015-09-19: qty 2

## 2015-09-19 MED ORDER — FENTANYL CITRATE (PF) 100 MCG/2ML IJ SOLN
INTRAMUSCULAR | Status: DC | PRN
  Administered 2015-09-19: 50 ug via INTRAVENOUS
  Administered 2015-09-19: 250 ug via INTRAVENOUS

## 2015-09-19 MED ORDER — ONDANSETRON HCL 4 MG/2ML IJ SOLN
INTRAMUSCULAR | Status: DC | PRN
  Administered 2015-09-19: 4 mg via INTRAVENOUS

## 2015-09-19 MED ORDER — FAMOTIDINE 20 MG PO TABS
20.0000 mg | ORAL_TABLET | Freq: Once | ORAL | Status: AC
  Administered 2015-09-19: 20 mg via ORAL

## 2015-09-19 MED ORDER — LACTATED RINGERS IV SOLN: INTRAVENOUS | Status: DC

## 2015-09-19 MED ORDER — DEXAMETHASONE SODIUM PHOSPHATE 4 MG/ML IJ SOLN
INTRAMUSCULAR | Status: DC | PRN
  Administered 2015-09-19: 10 mg via INTRAVENOUS

## 2015-09-19 MED ORDER — FENTANYL CITRATE (PF) 100 MCG/2ML IJ SOLN
25.0000 ug | INTRAMUSCULAR | Status: AC | PRN
  Administered 2015-09-19 (×6): 25 ug via INTRAVENOUS

## 2015-09-19 MED ORDER — BUPIVACAINE-EPINEPHRINE (PF) 0.5% -1:200000 IJ SOLN
INTRAMUSCULAR | Status: AC
  Filled 2015-09-19: qty 30

## 2015-09-19 MED ORDER — LIDOCAINE HCL (CARDIAC) 20 MG/ML IV SOLN
INTRAVENOUS | Status: DC | PRN
  Administered 2015-09-19: 100 mg via INTRAVENOUS

## 2015-09-19 MED ORDER — OXYCODONE-ACETAMINOPHEN 5-325 MG PO TABS
ORAL_TABLET | ORAL | Status: AC
  Administered 2015-09-19: 1 via ORAL
  Filled 2015-09-19: qty 1

## 2015-09-19 MED ORDER — ONDANSETRON HCL 4 MG/2ML IJ SOLN: 4.0000 mg | Freq: Once | INTRAMUSCULAR | Status: DC | PRN

## 2015-09-19 MED ORDER — ONDANSETRON 4 MG PO TBDP: 4.0000 mg | ORAL_TABLET | Freq: Four times a day (QID) | ORAL | Status: DC | PRN

## 2015-09-19 MED ORDER — PROPOFOL 10 MG/ML IV BOLUS
INTRAVENOUS | Status: DC | PRN
  Administered 2015-09-19: 200 mg via INTRAVENOUS

## 2015-09-19 MED ORDER — OXYCODONE-ACETAMINOPHEN 5-325 MG PO TABS
1.0000 | ORAL_TABLET | ORAL | Status: DC | PRN
  Administered 2015-09-19 (×2): 1 via ORAL

## 2015-09-19 MED ORDER — LACTATED RINGERS IV SOLN
INTRAVENOUS | Status: DC
  Administered 2015-09-19 (×2): via INTRAVENOUS

## 2015-09-19 MED ORDER — OXYCODONE-ACETAMINOPHEN 5-325 MG PO TABS: 1.0000 | ORAL_TABLET | ORAL | Status: DC | PRN

## 2015-09-19 MED ORDER — OXYCODONE-ACETAMINOPHEN 5-325 MG PO TABS
ORAL_TABLET | ORAL | Status: DC
Start: 2015-09-19 — End: 2015-09-19
  Filled 2015-09-19: qty 1

## 2015-09-19 MED ORDER — NEOSTIGMINE METHYLSULFATE 10 MG/10ML IV SOLN
INTRAVENOUS | Status: DC | PRN
  Administered 2015-09-19: 4 mg via INTRAVENOUS

## 2015-09-19 MED ORDER — FAMOTIDINE 20 MG PO TABS
ORAL_TABLET | ORAL | Status: AC
  Administered 2015-09-19: 20 mg via ORAL
  Filled 2015-09-19: qty 1

## 2015-09-19 MED ORDER — ROCURONIUM BROMIDE 100 MG/10ML IV SOLN
INTRAVENOUS | Status: DC | PRN
  Administered 2015-09-19: 40 mg via INTRAVENOUS

## 2015-09-19 MED ORDER — GLYCOPYRROLATE 0.2 MG/ML IJ SOLN
INTRAMUSCULAR | Status: DC | PRN
  Administered 2015-09-19: .6 mg via INTRAVENOUS

## 2015-09-19 MED ORDER — ACETAMINOPHEN 10 MG/ML IV SOLN
INTRAVENOUS | Status: AC
  Filled 2015-09-19: qty 100

## 2015-09-19 SURGICAL SUPPLY — 32 items
BLADE SURG SZ11 CARB STEEL (BLADE) ×2 IMPLANT
BNDG ADH 2 X3.75 FABRIC TAN LF (GAUZE/BANDAGES/DRESSINGS) IMPLANT
CANISTER SUCT 1200ML W/VALVE (MISCELLANEOUS) ×2 IMPLANT
CATH ROBINSON RED A/P 16FR (CATHETERS) ×2 IMPLANT
CHLORAPREP W/TINT 26ML (MISCELLANEOUS) ×2 IMPLANT
GLOVE BIO SURGEON STRL SZ8 (GLOVE) ×8 IMPLANT
GLOVE INDICATOR 8.0 STRL GRN (GLOVE) ×8 IMPLANT
GOWN STRL REUS W/ TWL LRG LVL3 (GOWN DISPOSABLE) ×1 IMPLANT
GOWN STRL REUS W/ TWL XL LVL3 (GOWN DISPOSABLE) ×1 IMPLANT
GOWN STRL REUS W/TWL LRG LVL3 (GOWN DISPOSABLE) ×1
GOWN STRL REUS W/TWL XL LVL3 (GOWN DISPOSABLE) ×1
IRRIGATION STRYKERFLOW (MISCELLANEOUS) ×1 IMPLANT
IRRIGATOR STRYKERFLOW (MISCELLANEOUS) ×2
IV LACTATED RINGERS 1000ML (IV SOLUTION) ×2 IMPLANT
KIT DISPOSABLE FALLOPE RING (Ring) ×2 IMPLANT
KIT PINK PAD W/HEAD ARE REST (MISCELLANEOUS) ×2
KIT PINK PAD W/HEAD ARM REST (MISCELLANEOUS) ×1 IMPLANT
KIT RM TURNOVER CYSTO AR (KITS) ×2 IMPLANT
LABEL OR SOLS (LABEL) IMPLANT
NS IRRIG 1000ML POUR BTL (IV SOLUTION) IMPLANT
NS IRRIG 500ML POUR BTL (IV SOLUTION) ×2 IMPLANT
PACK GYN LAPAROSCOPIC (MISCELLANEOUS) ×2 IMPLANT
PAD OB MATERNITY 4.3X12.25 (PERSONAL CARE ITEMS) ×2 IMPLANT
PAD PREP 24X41 OB/GYN DISP (PERSONAL CARE ITEMS) ×2 IMPLANT
POUCH ENDO CATCH 10MM SPEC (MISCELLANEOUS) IMPLANT
SCISSORS METZENBAUM CVD 33 (INSTRUMENTS) IMPLANT
SLEEVE ENDOPATH XCEL 5M (ENDOMECHANICALS) IMPLANT
SUT PLAIN 4 0 FS 2 27 (SUTURE) ×2 IMPLANT
SUT VIC AB 0 CT2 27 (SUTURE) IMPLANT
SUT VIC AB 0 UR5 27 (SUTURE) IMPLANT
TROCAR XCEL NON-BLD 5MMX100MML (ENDOMECHANICALS) ×2 IMPLANT
TUBING INSUFFLATOR HI FLOW (MISCELLANEOUS) ×2 IMPLANT

## 2015-09-19 NOTE — Interval H&P Note (Signed)
History and Physical Interval Note:  09/19/2015 1:59 PM  Leah Osborne  has presented today for surgery, with the diagnosis of desires permanent sterilization  The various methods of treatment have been discussed with the patient and family. After consideration of risks, benefits and other options for treatment, the patient has consented to  Procedure(s): LAPAROSCOPIC BILATERAL TUBAL BANDING (Bilateral) as a surgical intervention .  The patient's history has been reviewed, patient examined, no change in status, stable for surgery.  I have reviewed the patient's chart and labs.  Questions were answered to the patient's satisfaction.     Hassell Done A Blondie Riggsbee

## 2015-09-19 NOTE — Anesthesia Preprocedure Evaluation (Signed)
Anesthesia Evaluation  Patient identified by MRN, date of birth, ID band Patient awake    Reviewed: Allergy & Precautions, NPO status , Patient's Chart, lab work & pertinent test results, reviewed documented beta blocker date and time   Airway Mallampati: III  TM Distance: >3 FB     Dental  (+) Chipped   Pulmonary           Cardiovascular      Neuro/Psych  Headaches, Anxiety    GI/Hepatic   Endo/Other  Morbid obesity  Renal/GU      Musculoskeletal   Abdominal   Peds  Hematology  (+) anemia ,   Anesthesia Other Findings   Reproductive/Obstetrics                             Anesthesia Physical Anesthesia Plan  ASA: III  Anesthesia Plan: General   Post-op Pain Management:    Induction: Intravenous  Airway Management Planned: Oral ETT  Additional Equipment:   Intra-op Plan:   Post-operative Plan:   Informed Consent: I have reviewed the patients History and Physical, chart, labs and discussed the procedure including the risks, benefits and alternatives for the proposed anesthesia with the patient or authorized representative who has indicated his/her understanding and acceptance.     Plan Discussed with: CRNA  Anesthesia Plan Comments:         Anesthesia Quick Evaluation

## 2015-09-19 NOTE — Anesthesia Postprocedure Evaluation (Signed)
Anesthesia Post Note  Patient: Leah Osborne  Procedure(s) Performed: Procedure(s) (LRB): LAPAROSCOPIC BILATERAL TUBAL BANDING (Bilateral)  Patient location during evaluation: PACU Anesthesia Type: General Level of consciousness: awake Pain management: pain level controlled Vital Signs Assessment: post-procedure vital signs reviewed and stable Respiratory status: spontaneous breathing Cardiovascular status: blood pressure returned to baseline Anesthetic complications: no    Last Vitals:  Filed Vitals:   09/19/15 1533 09/19/15 1534  BP: 137/101 148/100  Pulse: 69 81  Temp: 36.9 C   Resp: 20 12    Last Pain: There were no vitals filed for this visit.               VAN STAVEREN,Karenna Romanoff

## 2015-09-19 NOTE — Op Note (Signed)
OPERATIVE NOTE:  Leah Osborne PROCEDURE DATE: 09/19/2015   PREOPERATIVE DIAGNOSIS: Desires elective sterilization  POSTOPERATIVE DIAGNOSIS: Desires elective sterilization PROCEDURE: Laparoscopic bilateral tubal banding with Falope-Rings  SURGEON:  Brayton Mars, MD ASSISTANTS: None ANESTHESIA: General INDICATIONS: 30 y.o. [id number] who presents for surgical sterilization procedure. Reversible methods of contraception are declined.  FINDINGS:   Normal pelvis. Falope-Rings were applied to the mid ampullary segment of tubes bilaterally   I/O's: Total I/O In: 800 [I.V.:800] Out: 310 [Urine:300; Blood:10] COUNTS:  YES SPECIMENS: None ANTIBIOTIC PROPHYLAXIS:N/A COMPLICATIONS: None immediate  PROCEDURE IN DETAIL:  Patient was brought to the operating room versus placed in the supine position. General endotracheal anesthesia was induced without difficulty. She was placed in the dorsal lithotomy position using the bumblebee stirrups. A ChloraPrep and Betadine abdominal perineal intravaginal prep and drape was performed in standard fashion. Timeout was performed. Red Robison catheter was used to drain 300 mL of urine from the bladder. Hulka tenaculum was placed onto the cervix to facilitate uterine manipulation. Subumbilical vertical incision 5 mm in length was made. The Optiview laparoscopic trocar system was used to place a 5 mm port directly into the abdominal pelvic cavity without evidence of bowel or vascular injury. The Falope ring applicator trocar was placed in the suprapubic region under direct visualization. Above-noted findings were documented. Falope ring applicator was loaded and sequentially each fallopian tube was ligated with a Falope ring being placed in the mid ampullary segment of the tubes bilaterally. No complications were encountered. Once satisfied with tubal banding and inspection of the pelvis, procedure was terminated, with all instrumentation being removed  from the abdominal pelvic cavity. Pneumoperitoneum was released. The incisions were closed with 3-0 Monocryl suture. Tegaderm/Telfa dressings were applied. Patient was then extubated taken recovery room in satisfactory condition.  Jiovani Mccammon A. Zipporah Plants, MD, [facility]

## 2015-09-19 NOTE — Transfer of Care (Signed)
Immediate Anesthesia Transfer of Care Note  Patient: Leah Osborne  Procedure(s) Performed: Procedure(s): LAPAROSCOPIC BILATERAL TUBAL BANDING (Bilateral)  Patient Location: PACU  Anesthesia Type:General  Level of Consciousness: awake, alert , oriented and patient cooperative  Airway & Oxygen Therapy: Patient Spontanous Breathing and Patient connected to face mask oxygen  Post-op Assessment: Report given to RN, Post -op Vital signs reviewed and stable and Patient moving all extremities X 4  Post vital signs: Reviewed and stable  Last Vitals:  Filed Vitals:   09/19/15 1533 09/19/15 1534  BP: 137/101 148/100  Pulse: 69 81  Temp: 36.9 C   Resp: 20 12    Complications: No apparent anesthesia complications

## 2015-09-19 NOTE — Anesthesia Procedure Notes (Signed)
Procedure Name: Intubation Date/Time: 09/19/2015 2:36 PM Performed by: Rosaria Ferries, Omarr Hann Pre-anesthesia Checklist: Patient identified, Emergency Drugs available, Suction available and Patient being monitored Patient Re-evaluated:Patient Re-evaluated prior to inductionOxygen Delivery Method: Circle system utilized Preoxygenation: Pre-oxygenation with 100% oxygen Intubation Type: IV induction Laryngoscope Size: Mac and 3 Grade View: Grade I Tube type: Oral Tube size: 7.0 mm Number of attempts: 1 Placement Confirmation: ETT inserted through vocal cords under direct vision,  positive ETCO2 and breath sounds checked- equal and bilateral Secured at: 21 cm Tube secured with: Tape Dental Injury: Teeth and Oropharynx as per pre-operative assessment

## 2015-09-19 NOTE — Discharge Instructions (Signed)
AMBULATORY SURGERY  DISCHARGE INSTRUCTIONS   1) The drugs that you were given will stay in your system until tomorrow so for the next 24 hours you should not:  A) Drive an automobile B) Make any legal decisions C) Drink any alcoholic beverage   2) You may resume regular meals tomorrow.  Today it is better to start with liquids and gradually work up to solid foods.  You may eat anything you prefer, but it is better to start with liquids, then soup and crackers, and gradually work up to solid foods.   3) Please notify your doctor immediately if you have any unusual bleeding, trouble breathing, redness and pain at the surgery site, drainage, fever, or pain not relieved by medication.   Please contact your physician with any problems or Same Day Surgery at [phone], Monday through Friday 6 am to 4 pm, or [place] [facility] number at [phone].   Laparoscopic Tubal Ligation, Care After Refer to this sheet in the next few weeks. These instructions provide you with information about caring for yourself after your procedure. Your health care provider may also give you more specific instructions. Your treatment has been planned according to current medical practices, but problems sometimes occur. Call your health care provider if you have any problems or questions after your procedure. WHAT TO EXPECT AFTER THE PROCEDURE After your procedure, it is common to have:  Sore throat.  Soreness at the incision site.  Mild cramping.  Tiredness.  Mild nausea or vomiting.  Shoulder pain. HOME CARE INSTRUCTIONS  Rest for the remainder of the day.  Take medicines only as directed by your health care provider. These include over-the-counter medicines and prescription medicines. Do not take aspirin, which can cause bleeding.  Over the next few days, gradually return to your normal activities and your normal diet.  Avoid sexual intercourse for 2 weeks or as directed by your  health care provider.  Do not use tampons, and do not douche.  Do not drive or operate heavy machinery while taking pain medicine.  Do not lift anything that is heavier than 5 lb (2.3 kg) for 2 weeks or as directed by your health care provider.  Do not take baths. Take showers only. Ask your health care provider when you can start taking baths.  Take your temperature twice each day and write it down.  Try to have help for your household needs for the first 7-10 days.  There are many different ways to close and cover an incision, including stitches (sutures), skin glue, and adhesive strips. Follow instructions from your health care provider about:  Incision care.  Bandage (dressing) changes and removal.  Incision closure removal.  Check your incision area every day for signs of infection. Watch for:  Redness, swelling, or pain.  Fluid, blood, or pus.  Keep all follow-up visits as directed by your health care provider. SEEK MEDICAL CARE IF:  You have redness, swelling, or increasing pain in your incision area.  You have fluid, blood, or pus coming from your incision for longer than 1 day.  You notice a bad smell coming from your incision or your dressing.  The edges of your incision break open after the sutures have been removed.  Your pain does not decrease after 2-3 days.  You have a rash.  You repeatedly become dizzy or light-headed.  You have a reaction to your medicine.  Your pain medicine is not helping.  You are constipated. [facility]  CARE IF:  You have a fever.  You faint.  You have increasing pain in your abdomen.  You have severe pain in one or both of your shoulders.  You have bleeding or drainage from your suture sites or your vagina after surgery.  You have shortness of breath or have difficulty breathing.  You have chest pain or leg pain.  You have ongoing nausea, vomiting, or diarrhea.   This information is not intended to  replace advice given to you by your health care provider. Make sure you discuss any questions you have with your health care provider.   Document Released: 12/22/2004 Document Revised: 10/19/2014 Document Reviewed: 09/15/2011 Elsevier Interactive Patient Education [employer].

## 2015-09-19 NOTE — H&P (View-Only) (Signed)
Subjective:  PREOPERATIVE HISTORY AND PHYSICAL     Date of surgery: 09/19/2015 Chief complaint: Desires sterilization   Patient is a 30 y.o. G2P2067female scheduled laparoscopic tubal banding for permanent sterilization.  Past obstetric history: Para 2002. G1-SVD, 5 lbs. 14 oz.;; preeclampsia complication. G2 SVD 8 lbs. 1 oz.; no complications. (Different father).  .   Gynecologic History Patient's last menstrual period was 07/26/2015 (approximate). Menarche-age 69. Cycles regular on a monthly basis. Duration of flow 5-7 days, moderate bleeding. History of severe dysmenorrhea; pain management with ibuprofen 600 mg per day; patient has missed work and school in the past because of severe cramps. Dysmenorrhea is characterized as central cramping along with low back pain without radiation. No history of deep thrusting dyspareunia. Family history of sister with severe dysmenorrhea without diagnosis of endometriosis. History of abnormal Pap smear No STI history    Menstrual History: OB History    Gravida Para Term Preterm AB TAB SAB Ectopic Multiple Living   2 2 2  0 0 0 0 0 0 2      Menarche age:NA Patient's last menstrual period was 08/26/2015.    Past Medical History  Diagnosis Date  . Abnormal Pap smear of cervix     Past Surgical History  Procedure Laterality Date  . Cholecystectomy      OB History  Gravida Para Term Preterm AB SAB TAB Ectopic Multiple Living  2 2 2  0 0 0 0 0 0 2    # Outcome Date GA Lbr Len/2nd Weight Sex Delivery Anes PTL Lv  2 Term 2016   8 lb 1.6 oz (3.674 kg) F Vag-Spont   Y  1 Term 2013   5 lb 2.2 oz (2.331 kg) M Vag-Spont   Y      Social History   Social History  . Marital Status: Single    Spouse Name: N/A  . Number of Children: N/A  . Years of Education: N/A   Social History Main Topics  . Smoking status: Never Smoker   . Smokeless tobacco: Never Used  . Alcohol Use: No  . Drug Use: No  . Sexual Activity: Yes    Birth  Control/ Protection: Condom   Other Topics Concern  . None   Social History Narrative    Family History  Problem Relation Age of Onset  . Diabetes Maternal Grandmother   . Heart disease Maternal Grandmother   . Cancer Neg Hx      (Not in a hospital admission)  No Known Allergies  Review of Systems Constitutional: No recent fever/chills/sweats Respiratory: No recent cough/bronchitis Cardiovascular: No chest pain Gastrointestinal: No recent nausea/vomiting/diarrhea Genitourinary: No UTI symptoms Hematologic/lymphatic:No history of coagulopathy or recent blood thinner use    Objective:    BP 132/86 mmHg  Pulse 65  Ht 5' (1.524 m)  Wt 275 lb 14.4 oz (125.147 kg)  BMI 53.88 kg/m2  LMP 08/26/2015  Breastfeeding? No  General:   Normal  Skin:   normal  HEENT:  Normal  Neck:  Supple without Adenopathy or Thyromegaly  Lungs:   Heart:              Breasts:   Abdomen:  Pelvis:  M/S   Extremeties:  Neuro:    clear to auscultation bilaterally   Normal without murmur   Not Examined   soft, non-tender; bowel sounds normal; no masses,  no organomegaly   Exam deferred to OR  No CVAT  Warm/Dry   Normal  Assessment:    Desires sterilization   Plan:  Laparoscopic bilateral tubal banding   Preop counseling: The patient is to undergo laparoscopic bilateral tubal banding on 09/19/2015. She is understanding of the planned procedure and is aware of and is accepting of all surgical risks which include but are not limited to bleeding, infection, pelvic organ injury with need for repair, blood clot disorders, anesthesia risks, failure rate of 1 out of 300. All questions have been answered. Informed consent is given. Patient is ready and willing to proceed with surgery as scheduled.  Brayton Mars, MD  Note: This dictation was prepared with Dragon dictation along with smaller phrase technology. Any transcriptional errors that result from this process are  unintentional.

## 2015-09-19 NOTE — Progress Notes (Signed)
Dr. Enzo Bi notified that the patient is requesting anti-nausea medication for home use. RX to be e-scribed to the patient's pharmacy [facility] in [place].

## 2015-09-20 ENCOUNTER — Encounter: Payer: Self-pay | Admitting: Obstetrics and Gynecology

## 2015-09-20 ENCOUNTER — Emergency Department
Admission: EM | Admit: 2015-09-20 | Discharge: 2015-09-20 | Disposition: A | Discharge status: Home / Self Care | Payer: Medicaid Other | Attending: Emergency Medicine | Admitting: Emergency Medicine

## 2015-09-20 ENCOUNTER — Emergency Department: Payer: Medicaid Other

## 2015-09-20 DIAGNOSIS — R1013 Epigastric pain: Secondary | ICD-10-CM | POA: Insufficient documentation

## 2015-09-20 DIAGNOSIS — Z9851 Tubal ligation status: Secondary | ICD-10-CM | POA: Diagnosis not present

## 2015-09-20 DIAGNOSIS — G8918 Other acute postprocedural pain: Secondary | ICD-10-CM

## 2015-09-20 DIAGNOSIS — R52 Pain, unspecified: Secondary | ICD-10-CM

## 2015-09-20 LAB — BASIC METABOLIC PANEL
ANION GAP: 5 (ref 5–15)
BUN: 13 mg/dL (ref 6–20)
CO2: 25 mmol/L (ref 22–32)
Calcium: 9 mg/dL (ref 8.9–10.3)
Chloride: 108 mmol/L (ref 101–111)
Creatinine, Ser: 0.62 mg/dL (ref 0.44–1.00)
GFR calc Af Amer: >60 mL/min (ref >60)
GFR calc non Af Amer: >60 mL/min (ref >60)
GLUCOSE: 170 mg/dL — AB (ref 65–99)
POTASSIUM: 3.5 mmol/L (ref 3.5–5.1)
Sodium: 138 mmol/L (ref 135–145)

## 2015-09-20 LAB — CBC
HEMATOCRIT: 38.4 % (ref 35.0–47.0)
Hemoglobin: 12.5 g/dL (ref 12.0–16.0)
MCH: 25.4 pg — ABNORMAL LOW (ref 26.0–34.0)
MCHC: 32.4 g/dL (ref 32.0–36.0)
MCV: 78.2 fL — AB (ref 80.0–100.0)
Platelets: 454 10*3/uL — ABNORMAL HIGH (ref 150–440)
RBC: 4.91 MIL/uL (ref 3.80–5.20)
RDW: 16.3 % — ABNORMAL HIGH (ref 11.5–14.5)
WBC: 23.7 10*3/uL — AB (ref 3.6–11.0)

## 2015-09-20 MED ORDER — MORPHINE SULFATE (PF) 4 MG/ML IV SOLN
4.0000 mg | Freq: Once | INTRAVENOUS | Status: AC
  Administered 2015-09-20: 4 mg via INTRAVENOUS
  Filled 2015-09-20: qty 1

## 2015-09-20 MED ORDER — ONDANSETRON HCL 4 MG/2ML IJ SOLN
4.0000 mg | Freq: Once | INTRAMUSCULAR | Status: AC
  Administered 2015-09-20: 4 mg via INTRAVENOUS
  Filled 2015-09-20: qty 2

## 2015-09-20 MED ORDER — SODIUM CHLORIDE 0.9 % IV BOLUS (SEPSIS)
1000.0000 mL | Freq: Once | INTRAVENOUS | Status: AC
  Administered 2015-09-20: 1000 mL via INTRAVENOUS

## 2015-09-20 NOTE — ED Provider Notes (Addendum)
[facility] Emergency Department Provider Note  ____________________________________________  Time seen: Approximately 5 PM  I have reviewed the triage vital signs and the nursing notes.   HISTORY  Chief Complaint Post-op Problem    HPI Leah Osborne is a 30 y.o. female who is postop day 1 from a tubal ligation was presenting to the emergency department for abdominal pain. She says the abdominal pain is upper, 10 out of 10 and sharp. She says it is worse when she lies and when she takes a deep breath. She has had nausea but no vomiting. No vaginal bleeding or discharge. Denies any pain with urination.   Past Medical History  Diagnosis Date  . Abnormal Pap smear of cervix   . Headache     MIGRAINES    Patient Active Problem List   Diagnosis Date Noted  . Encounter for sterilization 06/09/2015  . Morbid (severe) obesity due to excess calories (Newborn) 04/25/2015  . Episodic mood disorder ([place]) 02/02/2015  . Anemia, iron deficiency 01/04/2015  . Headache, migraine 01/04/2015  . Adiposity 01/04/2015  . Abnormal cytological findings in female genital organs 07/04/2011    Past Surgical History  Procedure Laterality Date  . Cholecystectomy    . Laparoscopic tubal ligation Bilateral 09/19/2015    Procedure: LAPAROSCOPIC BILATERAL TUBAL BANDING;  Surgeon: Brayton Mars, MD;  Location: [facility] ORS;  Service: Gynecology;  Laterality: Bilateral;    Current Outpatient Rx  Name  Route  Sig  Dispense  Refill  . ibuprofen (ADVIL,MOTRIN) 600 MG tablet   Oral   Take 600 mg by mouth.         . ondansetron (ZOFRAN ODT) 4 MG disintegrating tablet   Oral   Take 1 tablet (4 mg total) by mouth every 6 (six) hours as needed for nausea.   10 tablet   0   . oxyCODONE-acetaminophen (ROXICET) 5-325 MG tablet   Oral   Take 1-2 tablets by mouth every 4 (four) hours as needed for moderate pain or severe pain.   30 tablet   0     Allergies Review of  patient's allergies indicates no known allergies.  Family History  Problem Relation Age of Onset  . Diabetes Maternal Grandmother   . Heart disease Maternal Grandmother   . Cancer Neg Hx     Social History Social History  Substance Use Topics  . Smoking status: Never Smoker   . Smokeless tobacco: Never Used  . Alcohol Use: No    Review of Systems Constitutional: No fever/chills Eyes: No visual changes. ENT: No sore throat. Cardiovascular: Denies chest pain. Respiratory: Denies shortness of breath. Gastrointestinal:  no vomiting.  No diarrhea.  No constipation. Genitourinary: Negative for dysuria. Musculoskeletal: Negative for back pain. Skin: Negative for rash. Neurological: Negative for headaches, focal weakness or numbness.  10-point ROS otherwise negative.  ____________________________________________   PHYSICAL EXAM:  VITAL SIGNS: ED Triage Vitals  Enc Vitals Group     BP 09/20/15 1402 132/63 mmHg     Pulse Rate 09/20/15 1402 69     Resp 09/20/15 1402 16     Temp 09/20/15 1402 98.4 F (36.9 C)     Temp Source 09/20/15 1402 Oral     SpO2 09/20/15 1402 96 %     Weight 09/20/15 1402 269 lb (122.018 kg)     Height 09/20/15 1402 5' (1.524 m)     Head Cir --      Peak Flow --  Pain Score 09/20/15 1403 5     Pain Loc --      Pain Edu? --      Excl. in [place]? --     Constitutional: Alert and oriented. Well appearing and in no acute distress. Eyes: Conjunctivae are normal. PERRL. EOMI. Head: Atraumatic. Nose: No congestion/rhinnorhea. Mouth/Throat: Mucous membranes are moist.   Neck: No stridor.   Cardiovascular: Normal rate, regular rhythm. Grossly normal heart sounds.  Good peripheral circulation. Respiratory: Normal respiratory effort.  No retractions. Lungs CTAB. Gastrointestinal: Soft with moderate tenderness to palpation which is worse in the upper abdomen. No rigidity or rebound or guarding. No distention.  No CVA tenderness. Musculoskeletal: No  lower extremity tenderness nor edema.  No joint effusions. Neurologic:  Normal speech and language. No gross focal neurologic deficits are appreciated. No gait instability. Skin:  Skin is warm, dry and intact. No rash noted. Psychiatric: Mood and affect are normal. Speech and behavior are normal.  ____________________________________________   LABS (all labs ordered are listed, but only abnormal results are displayed)  Labs Reviewed  CBC - Abnormal; Notable for the following:    WBC 23.7 (*)    MCV 78.2 (*)    MCH 25.4 (*)    RDW 16.3 (*)    Platelets 454 (*)    All other components within normal limits  BASIC METABOLIC PANEL - Abnormal; Notable for the following:    Glucose, Bld 170 (*)    All other components within normal limits   ____________________________________________  EKG   ____________________________________________  RADIOLOGY  DG Abd 2 Views (Final result) Result time: 09/20/15 15:04:40   Final result by Rad Results In Interface (09/20/15 15:04:40)   Narrative:   CLINICAL DATA: Abdominal pain, had surgery yesterday for tubal ligation  EXAM: ABDOMEN - 2 VIEW  COMPARISON: None.  FINDINGS: There is normal small bowel gas pattern. Postcholecystectomy surgical clips are noted. Bilateral subdiaphragmatic free abdominal air probable postsurgical in nature. Clinical correlation is necessary. Moderate stool noted in right colon. Moderate stool and gas noted in transverse colon proximal left colon. Some colonic gas and stool noted distal left colon and proximal sigmoid colon.  IMPRESSION: Bilateral subdiaphragmatic free abdominal air probable postsurgical in nature. Clinical correlation is necessary. Moderate stool noted in right colon. Moderate stool and gas noted in transverse colon proximal left colon. Some colonic gas and stool noted distal left colon and proximal sigmoid colon. These results were called by telephone at the time of interpretation  on 09/20/2015 at 3:04 pm to Dr. Lenise Arena , who verbally acknowledged these results.   Electronically Signed By: Lahoma Crocker M.D. On: 09/20/2015 15:04    ____________________________________________   PROCEDURES    ____________________________________________   INITIAL IMPRESSION / ASSESSMENT AND PLAN / ED COURSE  Pertinent labs & imaging results that were available during my care of the patient were reviewed by me and considered in my medical decision making (see chart for details).  ----------------------------------------- 6:25 PM on 09/20/2015 -----------------------------------------  Discussed case with Dr. Leticia Penna of OB/GYN who is covering for Dr. Enzo Bi. Says that the story is consistent with postop pain from the free air. The patient is obese and Dr. Coralyn Mark says this is more common in heavier set patients. Dr. Marcelline Mates recommends Gas-X or Mylicon 3-4 times per day. She says also that the pain usually takes about a day after treatment to resolve itself. I discussed this with the patient as well as the family. They understand this plan to be discharged home  and are willing to comply with this treatment. Less likely to be PE. Vitals are normal and there is a much more likely diagnosis. ____________________________________________   FINAL CLINICAL IMPRESSION(S) / ED DIAGNOSES  Abdominal pain secondary to left over air from laparoscopic procedure.    Orbie Pyo, MD 09/20/15 1827  We also discussed return precautions including fever, worsening pain, racing heart shortness of breath or any other worsening or concerning symptoms. The patient and her family understand this plan and are willing to comply.  Orbie Pyo, MD 09/20/15 [phone]

## 2015-09-20 NOTE — ED Notes (Addendum)
Patient presents with c/o abdominal pain s/p have a BTL yesterday. (+) nausea. Patient reports that she had had periods where she has had trouble catching her breath. NAD noted at this time. Able to speak in complete sentences. Procedure was done laparoscopically - advised that it was "probably the gas rising".

## 2015-09-20 NOTE — Discharge Instructions (Signed)
Take Gas-X or Mylicon 3-4 times per day over the next 2 days for relief of your pain. Return to the hospital immediately if you have any worsening of your pain, difficulty breathing or racing heart or fever. He should also return for any worsening or concerning symptoms.  Abdominal Pain, Adult Many things can cause abdominal pain. Usually, abdominal pain is not caused by a disease and will improve without treatment. It can often be observed and treated at home. Your health care provider will do a physical exam and possibly order blood tests and X-rays to help determine the seriousness of your pain. However, in many cases, more time must pass before a clear cause of the pain can be found. Before that point, your health care provider may not know if you need more testing or further treatment. HOME CARE INSTRUCTIONS Monitor your abdominal pain for any changes. The following actions may help to alleviate any discomfort you are experiencing:  Only take over-the-counter or prescription medicines as directed by your health care provider.  Do not take laxatives unless directed to do so by your health care provider.  Try a clear liquid diet (broth, tea, or water) as directed by your health care provider. Slowly move to a bland diet as tolerated. SEEK MEDICAL CARE IF:  You have unexplained abdominal pain.  You have abdominal pain associated with nausea or diarrhea.  You have pain when you urinate or have a bowel movement.  You experience abdominal pain that wakes you in the night.  You have abdominal pain that is worsened or improved by eating food.  You have abdominal pain that is worsened with eating fatty foods.  You have a fever. SEEK IMMEDIATE MEDICAL CARE IF:  Your pain does not go away within 2 hours.  You keep throwing up (vomiting).  Your pain is felt only in portions of the abdomen, such as the right side or the left lower portion of the abdomen.  You pass bloody or black tarry  stools. MAKE SURE YOU:  Understand these instructions.  Will watch your condition.  Will get help right away if you are not doing well or get worse.   This information is not intended to replace advice given to you by your health care provider. Make sure you discuss any questions you have with your health care provider.   Document Released: 03/14/2005 Document Revised: 02/23/2015 Document Reviewed: 02/11/2013 Elsevier Interactive Patient Education [employer].

## 2015-09-22 ENCOUNTER — Encounter: Payer: Self-pay | Admitting: Obstetrics and Gynecology

## 2015-09-22 ENCOUNTER — Ambulatory Visit (INDEPENDENT_AMBULATORY_CARE_PROVIDER_SITE_OTHER): Payer: Medicaid Other | Admitting: Obstetrics and Gynecology

## 2015-09-22 VITALS — BP 118/75 | HR 60 | Ht 60.0 in | Wt 278.0 lb

## 2015-09-22 DIAGNOSIS — Z9851 Tubal ligation status: Secondary | ICD-10-CM | POA: Insufficient documentation

## 2015-09-22 DIAGNOSIS — Z09 Encounter for follow-up examination after completed treatment for conditions other than malignant neoplasm: Secondary | ICD-10-CM

## 2015-09-22 DIAGNOSIS — R1010 Upper abdominal pain, unspecified: Secondary | ICD-10-CM

## 2015-09-22 NOTE — Progress Notes (Signed)
Chief complaint: 1. Status post laparoscopic bilateral tubal banding 2. Follow-up from emergency room for upper abdominal pain  Patient presents for follow-up on severe upper abdominal pain, right upper quadrant and shoulder pain following laparoscopic sterilization procedure. Emergency room evaluation was consistent with postop pain from retained gas irritating the diaphragm. She is experiencing minimal abdominal discomfort at this time. She feels much better now that she had a bowel movement today. She is not requiring any significant analgesic pain relief at this time.  OBJECTIVE: BP 118/75 mmHg  Pulse 60  Ht 5' (1.524 m)  Wt 278 lb (126.1 kg)  BMI 54.29 kg/m2  LMP 08/26/2015 (Exact Date) Pleasant well-appearing female in no acute distress. She is alert and oriented. Abdomen: Soft, nontender; laparoscopy incisions are well approximated without evidence of infection or hernia. Pelvic: Deferred  ASSESSMENT: 1. Normal postop check status post laparoscopic bilateral tubal banding with Falope-Rings 2. Postoperative abdominal pain secondary to diaphragmatic irritation from CO2 gas, now resolved  PLAN: 1. Resume activities as tolerated 2. Return when necessary  Brayton Mars, MD  Note: This dictation was prepared with Dragon dictation along with smaller phrase technology. Any transcriptional errors that result from this process are unintentional.

## 2015-09-22 NOTE — Patient Instructions (Signed)
1. Resume activities as tolerated this time 2. Return to work note is given today. 3. Follow-up as needed.

## 2015-09-29 ENCOUNTER — Encounter: Payer: Medicaid Other | Admitting: Obstetrics and Gynecology

## 2015-10-01 ENCOUNTER — Emergency Department: Payer: Medicaid Other

## 2015-10-01 ENCOUNTER — Emergency Department
Admission: EM | Admit: 2015-10-01 | Discharge: 2015-10-01 | Disposition: A | Discharge status: Home / Self Care | Payer: Medicaid Other | Attending: Emergency Medicine | Admitting: Emergency Medicine

## 2015-10-01 ENCOUNTER — Encounter: Payer: Self-pay | Admitting: Radiology

## 2015-10-01 DIAGNOSIS — R109 Unspecified abdominal pain: Secondary | ICD-10-CM | POA: Diagnosis not present

## 2015-10-01 DIAGNOSIS — R112 Nausea with vomiting, unspecified: Secondary | ICD-10-CM | POA: Diagnosis present

## 2015-10-01 DIAGNOSIS — D72829 Elevated white blood cell count, unspecified: Secondary | ICD-10-CM | POA: Insufficient documentation

## 2015-10-01 DIAGNOSIS — F39 Unspecified mood [affective] disorder: Secondary | ICD-10-CM | POA: Diagnosis not present

## 2015-10-01 DIAGNOSIS — R197 Diarrhea, unspecified: Secondary | ICD-10-CM | POA: Diagnosis not present

## 2015-10-01 LAB — COMPREHENSIVE METABOLIC PANEL
ALBUMIN: 4.2 g/dL (ref 3.5–5.0)
ALK PHOS: 73 U/L (ref 38–126)
ALT: 32 U/L (ref 14–54)
ANION GAP: 6 (ref 5–15)
AST: 25 U/L (ref 15–41)
BUN: 19 mg/dL (ref 6–20)
CALCIUM: 9.1 mg/dL (ref 8.9–10.3)
CO2: 27 mmol/L (ref 22–32)
Chloride: 107 mmol/L (ref 101–111)
Creatinine, Ser: 0.72 mg/dL (ref 0.44–1.00)
GFR calc non Af Amer: >60 mL/min (ref >60)
GLUCOSE: 103 mg/dL — AB (ref 65–99)
POTASSIUM: 4.3 mmol/L (ref 3.5–5.1)
SODIUM: 140 mmol/L (ref 135–145)
TOTAL PROTEIN: 7.3 g/dL (ref 6.5–8.1)
Total Bilirubin: 0.8 mg/dL (ref 0.3–1.2)

## 2015-10-01 LAB — CBC
HEMATOCRIT: 40.3 % (ref 35.0–47.0)
HEMOGLOBIN: 13.1 g/dL (ref 12.0–16.0)
MCH: 25.8 pg — AB (ref 26.0–34.0)
MCHC: 32.6 g/dL (ref 32.0–36.0)
MCV: 79.1 fL — ABNORMAL LOW (ref 80.0–100.0)
Platelets: 413 10*3/uL (ref 150–440)
RBC: 5.1 MIL/uL (ref 3.80–5.20)
RDW: 16 % — ABNORMAL HIGH (ref 11.5–14.5)
WBC: 24.5 10*3/uL — ABNORMAL HIGH (ref 3.6–11.0)

## 2015-10-01 LAB — URINALYSIS COMPLETE WITH MICROSCOPIC (ARMC ONLY)
Bilirubin Urine: NEGATIVE
Glucose, UA: NEGATIVE mg/dL
KETONES UR: NEGATIVE mg/dL
NITRITE: NEGATIVE
PH: 6 (ref 5.0–8.0)
PROTEIN: NEGATIVE mg/dL
SPECIFIC GRAVITY, URINE: 1.019 (ref 1.005–1.030)

## 2015-10-01 LAB — LIPASE, BLOOD: Lipase: 44 U/L (ref 11–51)

## 2015-10-01 MED ORDER — METOCLOPRAMIDE HCL 10 MG PO TABS: 10.0000 mg | ORAL_TABLET | Freq: Three times a day (TID) | ORAL | Status: DC | PRN

## 2015-10-01 MED ORDER — ONDANSETRON HCL 4 MG/2ML IJ SOLN
4.0000 mg | Freq: Once | INTRAMUSCULAR | Status: AC
  Administered 2015-10-01: 4 mg via INTRAVENOUS
  Filled 2015-10-01: qty 2

## 2015-10-01 MED ORDER — ONDANSETRON HCL 4 MG/2ML IJ SOLN
4.0000 mg | Freq: Once | INTRAMUSCULAR | Status: AC | PRN
  Administered 2015-10-01: 4 mg via INTRAVENOUS
  Filled 2015-10-01: qty 2

## 2015-10-01 MED ORDER — SODIUM CHLORIDE 0.9 % IV BOLUS (SEPSIS)
1000.0000 mL | Freq: Once | INTRAVENOUS | Status: AC
  Administered 2015-10-01: 1000 mL via INTRAVENOUS

## 2015-10-01 MED ORDER — ONDANSETRON 4 MG PO TBDP: 4.0000 mg | ORAL_TABLET | Freq: Four times a day (QID) | ORAL | Status: DC | PRN

## 2015-10-01 MED ORDER — IOPAMIDOL (ISOVUE-300) INJECTION 61%
100.0000 mL | Freq: Once | INTRAVENOUS | Status: AC | PRN
  Administered 2015-10-01: 100 mL via INTRAVENOUS

## 2015-10-01 MED ORDER — METOCLOPRAMIDE HCL 5 MG/ML IJ SOLN
10.0000 mg | Freq: Once | INTRAMUSCULAR | Status: AC
  Administered 2015-10-01: 10 mg via INTRAVENOUS
  Filled 2015-10-01: qty 2

## 2015-10-01 MED ORDER — DIATRIZOATE MEGLUMINE & SODIUM 66-10 % PO SOLN
15.0000 mL | ORAL | Status: AC
  Administered 2015-10-01: 15 mL via ORAL
  Filled 2015-10-01: qty 30

## 2015-10-01 NOTE — Discharge Instructions (Signed)
You were seen in the emergency room for abdominal pain. It is important that you follow up closely with your primary care doctor in the next couple of days.  Your white blood cell count was noted to be high again today. I recommend you have this repeated with your primary care doctor in the next week.  If you're unable to see her primary care doctor you may return to the emergency room or go to the [place] walk-in clinic in 1 or 2 days for reexam.  Please return to the emergency room right away if you are to develop a fever, severe nausea, your pain becomes severe or worsens, you are unable to keep food down, begin vomiting any dark or bloody fluid, you develop any dark or bloody stools, feel dehydrated, or other new concerns or symptoms arise.   Diarrhea Diarrhea is frequent loose and watery bowel movements. It can cause you to feel weak and dehydrated. Dehydration can cause you to become tired and thirsty, have a dry mouth, and have decreased urination that often is dark yellow. Diarrhea is a sign of another problem, most often an infection that will not last long. In most cases, diarrhea typically lasts 2-3 days. However, it can last longer if it is a sign of something more serious. It is important to treat your diarrhea as directed by your caregiver to lessen or prevent future episodes of diarrhea. CAUSES  Some common causes include:  Gastrointestinal infections caused by viruses, bacteria, or parasites.  Food poisoning or food allergies.  Certain medicines, such as antibiotics, chemotherapy, and laxatives.  Artificial sweeteners and fructose.  Digestive disorders. HOME CARE INSTRUCTIONS  Ensure adequate fluid intake (hydration): Have 1 cup (8 oz) of fluid for each diarrhea episode. Avoid fluids that contain simple sugars or sports drinks, fruit juices, whole milk products, and sodas. Your urine should be clear or pale yellow if you are drinking enough fluids. Hydrate with an oral  rehydration solution that you can purchase at pharmacies, retail stores, and online. You can prepare an oral rehydration solution at home by mixing the following ingredients together:   - tsp table salt.   tsp baking soda.   tsp salt substitute containing potassium chloride.  1  tablespoons sugar.  1 L (34 oz) of water.  Certain foods and beverages may increase the speed at which food moves through the gastrointestinal (GI) tract. These foods and beverages should be avoided and include:  Caffeinated and alcoholic beverages.  High-fiber foods, such as raw fruits and vegetables, nuts, seeds, and whole grain breads and cereals.  Foods and beverages sweetened with sugar alcohols, such as xylitol, sorbitol, and mannitol.  Some foods may be well tolerated and may help thicken stool including:  Starchy foods, such as rice, toast, pasta, low-sugar cereal, oatmeal, grits, baked potatoes, crackers, and bagels.  Bananas.  Applesauce.  Add probiotic-rich foods to help increase healthy bacteria in the GI tract, such as yogurt and fermented milk products.  Wash your hands well after each diarrhea episode.  Only take over-the-counter or prescription medicines as directed by your caregiver.  Take a warm bath to relieve any burning or pain from frequent diarrhea episodes. SEEK IMMEDIATE MEDICAL CARE IF:   You are unable to keep fluids down.  You have persistent vomiting.  You have blood in your stool, or your stools are black and tarry.  You do not urinate in 6-8 hours, or there is only a small amount of very dark urine.  You  have abdominal pain that increases or localizes.  You have weakness, dizziness, confusion, or light-headedness.  You have a severe headache.  Your diarrhea gets worse or does not get better.  You have a fever or persistent symptoms for more than 2-3 days.  You have a fever and your symptoms suddenly get worse. MAKE SURE YOU:   Understand these  instructions.  Will watch your condition.  Will get help right away if you are not doing well or get worse.   This information is not intended to replace advice given to you by your health care provider. Make sure you discuss any questions you have with your health care provider.   Document Released: 05/25/2002 Document Revised: 06/25/2014 Document Reviewed: 02/10/2012 Elsevier Interactive Patient Education [employer].

## 2015-10-01 NOTE — ED Notes (Signed)
Pt. Going home with family will follow-up with PCP

## 2015-10-01 NOTE — ED Notes (Signed)
Pt. Given some graham crackers and ginger ale.

## 2015-10-01 NOTE — ED Notes (Signed)
Pt reports vomiting 7 times since 0900 and multiple episodes of diarrhea. Has tried Zofran and continues to vomit. Pt reports sweats and chills. Denies pain

## 2015-10-01 NOTE — ED Notes (Signed)
MD at bedside. 

## 2015-10-01 NOTE — ED Provider Notes (Signed)
[facility] Emergency Department Provider Note  ____________________________________________  Time seen: Approximately 6:01 PM  I have reviewed the triage vital signs and the nursing notes.   HISTORY  Chief Complaint Emesis and Diarrhea    HPI Leah Osborne Leah Osborne is a 30 y.o. female previous history of a cholecystectomy, migraines, and a recent tubal ligation.  Patient reports rather abrupt onset of nausea, vomiting about 7 or 8 times yellow "assidy" with severe nausea,and 5 or 6 loose watery stools.  She thinks she may have felt chilled like she had a fever but has not had a measured fever. No chest pain cough or trouble breathing.  Denies pregnancy. No urinary symptoms. No recent antibiotics, but did have a recent procedure. She reports that she is just having some occasional achy abdominal cramping which she refused to diarrhea, however denies having any significant or persistent abdominal pain.   Past Medical History  Diagnosis Date  . Abnormal Pap smear of cervix   . Headache     MIGRAINES    Patient Active Problem List   Diagnosis Date Noted  . Status post tubal ligation 09/22/2015  . Encounter for sterilization 06/09/2015  . Morbid (severe) obesity due to excess calories ([place]) 04/25/2015  . Episodic mood disorder ([place]) 02/02/2015  . Anemia, iron deficiency 01/04/2015  . Headache, migraine 01/04/2015  . Adiposity 01/04/2015  . Abnormal cytological findings in female genital organs 07/04/2011    Past Surgical History  Procedure Laterality Date  . Cholecystectomy    . Laparoscopic tubal ligation Bilateral 09/19/2015    Procedure: LAPAROSCOPIC BILATERAL TUBAL BANDING;  Surgeon: Brayton Mars, MD;  Location: [facility] ORS;  Service: Gynecology;  Laterality: Bilateral;    Current Outpatient Rx  Name  Route  Sig  Dispense  Refill  . ibuprofen (ADVIL,MOTRIN) 600 MG tablet   Oral   Take 600 mg by mouth See admin instructions. Take 1 tablet by  mouth every 4 to 6 hours as needed for pain.         Marland Kitchen metoCLOPramide (REGLAN) 10 MG tablet   Oral   Take 1 tablet (10 mg total) by mouth every 8 (eight) hours as needed for nausea or vomiting.   30 tablet   0   . ondansetron (ZOFRAN ODT) 4 MG disintegrating tablet   Oral   Take 1 tablet (4 mg total) by mouth every 6 (six) hours as needed for nausea or vomiting.   20 tablet   0     Allergies Review of patient's allergies indicates no known allergies.  Family History  Problem Relation Age of Onset  . Diabetes Maternal Grandmother   . Heart disease Maternal Grandmother   . Cancer Neg Hx     Social History Social History  Substance Use Topics  . Smoking status: Never Smoker   . Smokeless tobacco: Never Used  . Alcohol Use: No    Review of Systems Constitutional: No fever/chills. He is very fatigued and dehydrated Eyes: No visual changes. ENT: No sore throat. Cardiovascular: Denies chest pain. Respiratory: Denies shortness of breath. Gastrointestinal:  No constipation. Genitourinary: Negative for dysuria. Musculoskeletal: Negative for back pain. Skin: Negative for rash. Reports her scars are healing well. Neurological: Negative for headaches, focal weakness or numbness.  10-point ROS otherwise negative.  ____________________________________________   PHYSICAL EXAM:  VITAL SIGNS: ED Triage Vitals  Enc Vitals Group     BP 10/01/15 1456 148/87 mmHg     Pulse Rate 10/01/15 1456 111  Resp 10/01/15 1456 20     Temp 10/01/15 1456 97.9 F (36.6 C)     Temp Source 10/01/15 1456 Oral     SpO2 10/01/15 1456 96 %     Weight 10/01/15 1456 278 lb (126.1 kg)     Height 10/01/15 1456 5' (1.524 m)     Head Cir --      Peak Flow --      Pain Score --      Pain Loc --      Pain Edu? --      Excl. in [place]? --    Constitutional: Alert and oriented. Mildly fatigued, slightly ill-appearing, nauseated appearance. Pleasant and in no distress. Eyes: Conjunctivae are  normal. PERRL. EOMI. Head: Atraumatic. Nose: No congestion/rhinnorhea. Mouth/Throat: Mucous membranes are dry.  Oropharynx non-erythematous. Neck: No stridor.   Cardiovascular: Normal rate, regular rhythm. Grossly normal heart sounds.  Good peripheral circulation. Respiratory: Normal respiratory effort.  No retractions. Lungs CTAB. Gastrointestinal: Soft and nontender set for just some minimal discomfort noted over the right flank without rebound or guarding. Her abdominal incisions are clean and well healing.. No distention. No abdominal bruits. No CVA tenderness. Musculoskeletal: No lower extremity tenderness nor edema.  No joint effusions. Neurologic:  Normal speech and language. No gross focal neurologic deficits are appreciated. No gait instability. Skin:  Skin is slightly cool, dry and intact. No rash noted. Psychiatric: Mood and affect are normal. Speech and behavior are normal.  ____________________________________________   LABS (all labs ordered are listed, but only abnormal results are displayed)  Labs Reviewed  COMPREHENSIVE METABOLIC PANEL - Abnormal; Notable for the following:    Glucose, Bld 103 (*)    All other components within normal limits  CBC - Abnormal; Notable for the following:    WBC 24.5 (*)    MCV 79.1 (*)    MCH 25.8 (*)    RDW 16.0 (*)    All other components within normal limits  URINALYSIS COMPLETEWITH MICROSCOPIC ([facility] ONLY) - Abnormal; Notable for the following:    Color, Urine YELLOW (*)    APPearance HAZY (*)    Hgb urine dipstick 1+ (*)    Leukocytes, UA 1+ (*)    Bacteria, UA RARE (*)    Squamous Epithelial / LPF 6-30 (*)    All other components within normal limits  GASTROINTESTINAL PANEL BY PCR, STOOL (REPLACES STOOL CULTURE)  C DIFFICILE QUICK SCREEN W PCR REFLEX  LIPASE, BLOOD  POC URINE PREG, ED   ____________________________________________  EKG   ____________________________________________  RADIOLOGY  CT Abdomen Pelvis  W Contrast (Final result) Result time: 10/01/15 21:11:33   Final result by Rad Results In Interface (10/01/15 21:11:33)   Narrative:   CLINICAL DATA: Leukocytosis. Nausea vomiting and diarrhea since this morning.  EXAM: CT ABDOMEN AND PELVIS WITH CONTRAST  TECHNIQUE: Multidetector CT imaging of the abdomen and pelvis was performed using the standard protocol following bolus administration of intravenous contrast.  CONTRAST: 130mL ISOVUE-300 IOPAMIDOL (ISOVUE-300) INJECTION 61%  COMPARISON: None.  FINDINGS: Lower chest: No significant abnormality  Hepatobiliary: Cholecystectomy. Normal liver and bile ducts.  Pancreas: Normal  Spleen: Normal  Adrenals/Urinary Tract: The adrenals are normal. There is a horseshoe kidney. No renal parenchymal lesions. Ureters and urinary bladder are unremarkable.  Stomach/Bowel: There are normal appearances of the stomach, small bowel and colon. The appendix is normal.  Vascular/Lymphatic: The abdominal aorta is normal in caliber. There is no atherosclerotic calcification. There is no adenopathy in the abdomen or  pelvis.  Reproductive: Unremarkable uterus and ovaries.  Other: No acute inflammatory changes are evident in the abdomen or pelvis. There is no ascites. No abscess, hematoma or other abnormal collection.  Musculoskeletal: No significant skeletal lesion.  IMPRESSION: No significant abnormality.   Electronically Signed By: Andreas Newport M.D. On: 10/01/2015 21:11       ____________________________________________   PROCEDURES  Procedure(s) performed: None  Critical Care performed: No  ____________________________________________   INITIAL IMPRESSION / ASSESSMENT AND PLAN / ED COURSE  Pertinent labs & imaging results that were available during my care of the patient were reviewed by me and considered in my medical decision making (see chart for details).  Patient presents with nausea vomiting  diarrhea and crampy abdominal pain. She is notably postoperative though her scars appear to be healing well. She was of some mild discomfort on the right side of the abdomen.  Overall my impression is history likely some sort of acute gastroenteritis or viral type picture, however given her recent surgery we will send a stool culture if she is able to provide. In addition urinalysis and in review of her labs she has significant leukocytosis, and she is tachycardic. Given her postop status we will evaluate for intra-abdominal abnormality such as abscess or infection. Plan CT abdomen and pelvis, patient and mother agreeable with this plan. Hydrate well, provide antiemetics.  ----------------------------------------- 10:17 PM on 10/01/2015 -----------------------------------------  Patient reports she feels much better. No further nausea, no emesis, and she is now hungry requesting to eat. She has not had a more stooling here, thus cultures were not obtained of the stool. We discuss careful return precautions also follow-up with her doctor as well for repeat white blood cell count given his persistent elevation recently. She is awake alert and well oriented in no distress. We'll discharge home at this time. Appears much improved.  Return precautions and treatment recommendations and follow-up discussed with the patient who is agreeable with the plan.  ____________________________________________   FINAL CLINICAL IMPRESSION(S) / ED DIAGNOSES  Final diagnoses:  Non-intractable vomiting with nausea, vomiting of unspecified type  Diarrhea, unspecified type  Leukocytosis   Filed Vitals:   10/01/15 1456 10/01/15 2219  BP: 148/87 125/111  Pulse: 111 95  Temp: 97.9 F (36.6 C)   Resp: 20 16      Delman Kitten, MD 10/01/15 2236

## 2015-10-04 ENCOUNTER — Telehealth: Payer: Self-pay | Admitting: Family Medicine

## 2015-10-04 DIAGNOSIS — D72829 Elevated white blood cell count, unspecified: Secondary | ICD-10-CM

## 2015-10-04 NOTE — Telephone Encounter (Signed)
Pt went to ER on Saturday and was told to follow up with primary care for lab work for elevated white blood count.  She would like to pick up a lab order.  Her call back number is [phone]

## 2015-10-04 NOTE — Telephone Encounter (Signed)
I can place a lab order electronically with [facility]- however I would like to see her if her symptoms have not improved. She can go to [facility] anytime. Thanks! AK

## 2015-10-05 NOTE — Telephone Encounter (Signed)
Patient aware.

## 2015-10-05 NOTE — Telephone Encounter (Signed)
Left message for patient to call office.  

## 2015-10-06 ENCOUNTER — Encounter: Payer: Medicaid Other | Admitting: Obstetrics and Gynecology

## 2015-10-06 LAB — CBC WITH DIFFERENTIAL/PLATELET
BASOS ABS: 0 10*3/uL (ref 0.0–0.2)
BASOS: 1 %
EOS (ABSOLUTE): 0.2 10*3/uL (ref 0.0–0.4)
Eos: 3 %
HEMOGLOBIN: 11.4 g/dL (ref 11.1–15.9)
Hematocrit: 34.9 % (ref 34.0–46.6)
IMMATURE GRANS (ABS): 0 10*3/uL (ref 0.0–0.1)
IMMATURE GRANULOCYTES: 0 %
LYMPHS: 35 %
Lymphocytes Absolute: 2.8 10*3/uL (ref 0.7–3.1)
MCH: 26.1 pg — AB (ref 26.6–33.0)
MCHC: 32.7 g/dL (ref 31.5–35.7)
MCV: 80 fL (ref 79–97)
MONOCYTES: 12 %
Monocytes Absolute: 0.9 10*3/uL (ref 0.1–0.9)
NEUTROS ABS: 4 10*3/uL (ref 1.4–7.0)
NEUTROS PCT: 49 %
Platelets: 369 10*3/uL (ref 150–379)
RBC: 4.36 x10E6/uL (ref 3.77–5.28)
RDW: 15.3 % (ref 12.3–15.4)
WBC: 8 10*3/uL (ref 3.4–10.8)

## 2015-11-07 ENCOUNTER — Telehealth: Payer: Self-pay | Admitting: Family Medicine

## 2015-11-07 MED ORDER — AZITHROMYCIN 250 MG PO TABS: ORAL_TABLET | ORAL | Status: DC

## 2015-11-07 NOTE — Telephone Encounter (Signed)
As per pt her daughter is being treated for pertussis and her pediatrician suggested that she should get treated as well,  does she needs an appointment or can we send something for it ?

## 2015-11-07 NOTE — Addendum Note (Signed)
Addended byVincenza Hews, Tahari Clabaugh L on: 11/07/2015 02:30 PM   Modules accepted: Orders

## 2015-11-07 NOTE — Telephone Encounter (Signed)
Called and spoke with patient. Infant daughter has suspected pertussis. MD is recommending treatment for her. Will start [place] today. Pt mother is patient here and had close contact. Encouraged her to have Mom call here when diagnosis is confirmed for treatment.

## 2016-01-19 ENCOUNTER — Encounter: Payer: Self-pay | Admitting: Family Medicine

## 2016-01-23 ENCOUNTER — Ambulatory Visit (INDEPENDENT_AMBULATORY_CARE_PROVIDER_SITE_OTHER): Payer: Commercial Managed Care - PPO | Admitting: Family Medicine

## 2016-01-23 VITALS — BP 132/86 | HR 62 | Temp 98.1°F | Resp 16 | Ht 60.0 in | Wt 285.0 lb

## 2016-01-23 DIAGNOSIS — F32A Depression, unspecified: Secondary | ICD-10-CM | POA: Insufficient documentation

## 2016-01-23 DIAGNOSIS — F419 Anxiety disorder, unspecified: Principal | ICD-10-CM

## 2016-01-23 DIAGNOSIS — F418 Other specified anxiety disorders: Secondary | ICD-10-CM

## 2016-01-23 DIAGNOSIS — F329 Major depressive disorder, single episode, unspecified: Secondary | ICD-10-CM | POA: Insufficient documentation

## 2016-01-23 LAB — TSH: TSH: 10.26 m[IU]/L — AB

## 2016-01-23 MED ORDER — LORAZEPAM 0.5 MG PO TABS: 0.2500 mg | ORAL_TABLET | Freq: Two times a day (BID) | ORAL | 0 refills | Status: DC

## 2016-01-23 MED ORDER — VENLAFAXINE HCL ER 37.5 MG PO TB24: ORAL_TABLET | ORAL | 11 refills | Status: DC

## 2016-01-23 NOTE — Progress Notes (Signed)
Subjective:    Patient ID: Leah Osborne, female    DOB: [birth year], 30 y.o.   MRN: [id number]  HPI: Leah Osborne is a 30 y.o. female presenting on 01/23/2016 for Anxiety and Depression   HPI  Pt presents for depression and anxiety. She had some depression during her pregnancy. Randel Books is 71mos old. They did want to put her on medication during her pregnancy. Her OB was discussing putting her on medication at that time. Pt is feeling very anxious.  Feels overwhelmed.  Has been on depression medication- tried Zoloft and Lexapro in the past. Zoloft made it worse and Lexapro did not help- increased her suicidal thoughts.  Mild passive SI- no actual plan or intention. Just thoughts that life would be easier if she were not here. Safety contract made.   Past Medical History:  Diagnosis Date  . Abnormal Pap smear of cervix   . Headache    MIGRAINES    No current outpatient prescriptions on file prior to visit.   No current facility-administered medications on file prior to visit.     Review of Systems  Constitutional: Negative for chills and fever.  HENT: Negative.   Respiratory: Negative for cough, chest tightness and wheezing.   Cardiovascular: Negative for chest pain and leg swelling.  Gastrointestinal: Negative for abdominal pain, constipation, diarrhea, nausea and vomiting.  Endocrine: Negative.  Negative for cold intolerance, heat intolerance, polydipsia, polyphagia and polyuria.  Genitourinary: Negative for difficulty urinating and dysuria.  Musculoskeletal: Negative.   Neurological: Negative for dizziness, light-headedness and numbness.  Psychiatric/Behavioral: Positive for decreased concentration and suicidal ideas. Negative for self-injury. The patient is nervous/anxious.    Per HPI unless specifically indicated above Depression screen [facility] 2/9 01/23/2016  Decreased Interest 2  Down, Depressed, Hopeless 2  PHQ - 2 Score 4  Altered sleeping 3  Tired, decreased energy 3    Change in appetite 3  Feeling bad or failure about yourself  2  Trouble concentrating 0  Moving slowly or fidgety/restless 0  Suicidal thoughts 1  PHQ-9 Score 16  Difficult doing work/chores Extremely dIfficult     GAD 7 : Generalized Anxiety Score 01/23/2016  Nervous, Anxious, on Edge 3  Control/stop worrying 2  Worry too much - different things 3  Trouble relaxing 3  Restless 3  Easily annoyed or irritable 3  Afraid - awful might happen 3  Total GAD 7 Score 20  Anxiety Difficulty Extremely difficult        Objective:    BP 132/86 (BP Location: Left Arm)   Pulse 62   Temp 98.1 F (36.7 C) (Oral)   Resp 16   Ht 5' (1.524 m)   Wt 285 lb (129.3 kg)   BMI 55.66 kg/m   Wt Readings from Last 3 Encounters:  01/23/16 285 lb (129.3 kg)  10/01/15 278 lb (126.1 kg)  09/22/15 278 lb (126.1 kg)    Physical Exam  Constitutional: She is oriented to person, place, and time. She appears well-developed and well-nourished.  HENT:  Head: Normocephalic and atraumatic.  Neck: Neck supple.  Cardiovascular: Normal rate, regular rhythm and normal heart sounds.  Exam reveals no gallop and no friction rub.   No murmur heard. Pulmonary/Chest: Effort normal and breath sounds normal. She has no wheezes. She exhibits no tenderness.  Abdominal: Soft. Normal appearance and bowel sounds are normal. She exhibits no distension and no mass. There is no tenderness. There is no rebound and no guarding.  Musculoskeletal: Normal  range of motion. She exhibits no edema or tenderness.  Lymphadenopathy:    She has no cervical adenopathy.  Neurological: She is alert and oriented to person, place, and time.  Skin: Skin is warm and dry.  Psychiatric: Her speech is normal and behavior is normal. Judgment normal. Cognition and memory are normal. She exhibits a depressed mood. She expresses no suicidal plans and no homicidal plans.   Results for orders placed or performed in visit on 10/04/15  CBC with  Differential/Platelet  Result Value Ref Range   WBC 8.0 3.4 - 10.8 x10E3/uL   RBC 4.36 3.77 - 5.28 x10E6/uL   Hemoglobin 11.4 11.1 - 15.9 g/dL   Hematocrit 34.9 34.0 - 46.6 %   MCV 80 79 - 97 fL   MCH 26.1 (L) 26.6 - 33.0 pg   MCHC 32.7 31.5 - 35.7 g/dL   RDW 15.3 12.3 - 15.4 %   Platelets 369 150 - 379 x10E3/uL   Neutrophils 49 %   Lymphs 35 %   Monocytes 12 %   Eos 3 %   Basos 1 %   Neutrophils Absolute 4.0 1.4 - 7.0 x10E3/uL   Lymphocytes Absolute 2.8 0.7 - 3.1 x10E3/uL   Monocytes Absolute 0.9 0.1 - 0.9 x10E3/uL   EOS (ABSOLUTE) 0.2 0.0 - 0.4 x10E3/uL   Basophils Absolute 0.0 0.0 - 0.2 x10E3/uL   Immature Granulocytes 0 %   Immature Grans (Abs) 0.0 0.0 - 0.1 x10E3/uL      Assessment & Plan:   Problem List Items Addressed This Visit      Other   Anxiety and depression - Primary    Check for exacerbating causes in labwork. Will start venlafaxine with PRN lorazepam for panic symptoms. Reviewed risk vs. Benefits. Encouraged counseling if symptoms persist. Passive SI addressed- pt has no plan or access to firearms. Does not feel she is a danger to herself. Pt will go to ER if she feels she is a danger to herself.       Relevant Medications   Venlafaxine HCl 37.5 MG TB24   LORazepam (ATIVAN) 0.5 MG tablet   Other Relevant Orders   VITAMIN D 25 Hydroxy (Vit-D Deficiency, Fractures)   TSH   B12   Folate   BASIC METABOLIC PANEL WITH GFR    Other Visit Diagnoses   None.     Meds ordered this encounter  Medications  . Venlafaxine HCl 37.5 MG TB24    Sig: Take 1 tablet by mouth at bedtime for 1 week and then increase to 2 tablets by mouth daily.    Dispense:  60 each    Refill:  11    Order Specific Question:   Supervising Provider    Answer:   Arlis Porta [phone]  . LORazepam (ATIVAN) 0.5 MG tablet    Sig: Take 0.5 tablets (0.25 mg total) by mouth 2 (two) times daily.    Dispense:  15 tablet    Refill:  0    Order Specific Question:   Supervising  Provider    Answer:   Arlis Porta [id number]      Follow up plan: Return in about 4 weeks (around 02/20/2016) for Anxiety/ depression. Marland Kitchen

## 2016-01-23 NOTE — Assessment & Plan Note (Signed)
Check for exacerbating causes in labwork. Will start venlafaxine with PRN lorazepam for panic symptoms. Reviewed risk vs. Benefits. Encouraged counseling if symptoms persist. Passive SI addressed- pt has no plan or access to firearms. Does not feel she is a danger to herself. Pt will go to ER if she feels she is a danger to herself.

## 2016-01-23 NOTE — Patient Instructions (Signed)
We will start venlafaxine (Effexor) once daily. Start by taking 1 tablet and then increase  Up to 2 tablets after 1 week. Try for 30 minutes of exercise daily to help with symptoms. You can also take a vitamin D 2000IU OTC to help with symptoms.  We will check some lab work to determine if anything is exacerbating your symptoms.

## 2016-01-24 ENCOUNTER — Other Ambulatory Visit: Payer: Self-pay | Admitting: Family Medicine

## 2016-01-24 ENCOUNTER — Encounter: Payer: Self-pay | Admitting: Family Medicine

## 2016-01-24 DIAGNOSIS — E034 Atrophy of thyroid (acquired): Secondary | ICD-10-CM

## 2016-01-24 DIAGNOSIS — E559 Vitamin D deficiency, unspecified: Secondary | ICD-10-CM

## 2016-01-24 LAB — BASIC METABOLIC PANEL WITH GFR
BUN: 12 mg/dL (ref 7–25)
CALCIUM: 9 mg/dL (ref 8.6–10.2)
CO2: 25 mmol/L (ref 20–31)
CREATININE: 0.66 mg/dL (ref 0.50–1.10)
Chloride: 104 mmol/L (ref 98–110)
GFR, Est African American: >89 mL/min (ref >=60)
GFR, Est Non African American: >89 mL/min (ref >=60)
Glucose, Bld: 83 mg/dL (ref 65–99)
Potassium: 4 mmol/L (ref 3.5–5.3)
SODIUM: 140 mmol/L (ref 135–146)

## 2016-01-24 LAB — FOLATE: FOLATE: 12.1 ng/mL (ref >5.4)

## 2016-01-24 LAB — VITAMIN D 25 HYDROXY (VIT D DEFICIENCY, FRACTURES): VIT D 25 HYDROXY: 23 ng/mL — AB (ref 30–100)

## 2016-01-24 LAB — VITAMIN B12: Vitamin B-12: 598 pg/mL (ref 200–1100)

## 2016-01-24 MED ORDER — LEVOTHYROXINE SODIUM 25 MCG PO TABS: 25.0000 ug | ORAL_TABLET | Freq: Every day | ORAL | 11 refills | Status: DC

## 2016-01-24 MED ORDER — VITAMIN D (ERGOCALCIFEROL) 1.25 MG (50000 UNIT) PO CAPS: 50000.0000 [IU] | ORAL_CAPSULE | ORAL | 1 refills | Status: DC

## 2016-01-30 ENCOUNTER — Ambulatory Visit (INDEPENDENT_AMBULATORY_CARE_PROVIDER_SITE_OTHER): Payer: Commercial Managed Care - PPO | Admitting: Family Medicine

## 2016-01-30 VITALS — BP 134/89 | HR 57 | Temp 97.9°F | Resp 16 | Ht 60.0 in | Wt 278.4 lb

## 2016-01-30 DIAGNOSIS — R1030 Lower abdominal pain, unspecified: Secondary | ICD-10-CM

## 2016-01-30 DIAGNOSIS — R102 Pelvic and perineal pain: Secondary | ICD-10-CM

## 2016-01-30 LAB — POCT URINALYSIS DIPSTICK
BILIRUBIN UA: NEGATIVE
GLUCOSE UA: NEGATIVE
Ketones, UA: NEGATIVE
LEUKOCYTES UA: NEGATIVE
NITRITE UA: NEGATIVE
PH UA: 5
RBC UA: NEGATIVE
Spec Grav, UA: 1.02
UROBILINOGEN UA: NEGATIVE

## 2016-01-30 LAB — POCT WET PREP (WET MOUNT): TRICHOMONAS WET PREP HPF POC: ABSENT

## 2016-01-30 LAB — BASIC METABOLIC PANEL WITH GFR
BUN: 16 mg/dL (ref 7–25)
CO2: 27 mmol/L (ref 20–31)
Calcium: 9.2 mg/dL (ref 8.6–10.2)
Chloride: 105 mmol/L (ref 98–110)
Creat: 0.93 mg/dL (ref 0.50–1.10)
GFR, EST NON AFRICAN AMERICAN: 84 mL/min (ref >=60)
GFR, Est African American: >89 mL/min (ref >=60)
GLUCOSE: 80 mg/dL (ref 65–99)
POTASSIUM: 4.5 mmol/L (ref 3.5–5.3)
Sodium: 140 mmol/L (ref 135–146)

## 2016-01-30 LAB — CBC WITH DIFFERENTIAL/PLATELET
BASOS ABS: 96 {cells}/uL (ref 0–200)
Basophils Relative: 1 %
EOS PCT: 2 %
Eosinophils Absolute: 192 cells/uL (ref 15–500)
HEMATOCRIT: 38.7 % (ref 35.0–45.0)
HEMOGLOBIN: 12.6 g/dL (ref 11.7–15.5)
LYMPHS ABS: 2784 {cells}/uL (ref 850–3900)
Lymphocytes Relative: 29 %
MCH: 26.1 pg — ABNORMAL LOW (ref 27.0–33.0)
MCHC: 32.6 g/dL (ref 32.0–36.0)
MCV: 80.1 fL (ref 80.0–100.0)
MPV: 9 fL (ref 7.5–12.5)
Monocytes Absolute: 768 cells/uL (ref 200–950)
Monocytes Relative: 8 %
NEUTROS ABS: 5760 {cells}/uL (ref 1500–7800)
Neutrophils Relative %: 60 %
Platelets: 397 10*3/uL (ref 140–400)
RBC: 4.83 MIL/uL (ref 3.80–5.10)
RDW: 15.3 % — ABNORMAL HIGH (ref 11.0–15.0)
WBC: 9.6 10*3/uL (ref 3.8–10.8)

## 2016-01-30 LAB — LIPASE: LIPASE: 13 U/L (ref 7–60)

## 2016-01-30 LAB — AMYLASE: Amylase: 28 U/L (ref 0–105)

## 2016-01-30 LAB — POCT URINE PREGNANCY: PREG TEST UR: NEGATIVE

## 2016-01-30 MED ORDER — HYDROCODONE-ACETAMINOPHEN 5-325 MG PO TABS: 1.0000 | ORAL_TABLET | Freq: Four times a day (QID) | ORAL | 0 refills | Status: DC | PRN

## 2016-01-30 MED ORDER — DICYCLOMINE HCL 20 MG PO TABS: 20.0000 mg | ORAL_TABLET | Freq: Three times a day (TID) | ORAL | 0 refills | Status: DC

## 2016-01-30 NOTE — Patient Instructions (Signed)
We will get an ultrasound to evaluate your symptoms.  I have also given you some pain medication for your stomach pain.  Take as needed.   If you have severe abodminal pain, nausea, vomiting, or fever please go to to ER.

## 2016-01-30 NOTE — Progress Notes (Signed)
Subjective:    Patient ID: Leah Osborne, female    DOB: 1986/05/30, 30 y.o.   MRN: [id number]  HPI: Leah Osborne is a 30 y.o. female presenting on 01/30/2016 for Back Pain (pt has abdominal pain cramping,   period was 5 days ago and also had diarrhea yesterday but not today onset 4 days back pain and abdomial pain 2 day and gotten worst today.)   HPI  Pt presents for back pain, abdominal pain, and diarrhea. Back pain started on Friday. Pain "wraps around to abdomen" and radiates down to the R leg. Pain is worse in the R pelvic region.  Has tried midol and ibuprofen with no help. Feels like a labor contraction. Not worsened with meals. Constant pain. Yesterday had multiple episodes of diahrrea. No fevers. No blood in urine. Pain is worsening from Friday. Pain is colicky. Lower abdominal. Recently had a tubal ligation. LMP August 5- normal for her. Periods are regular. 1 partner. Has had sex since pelvic pain began, pain worsened with sex.    Past Medical History:  Diagnosis Date  . Abnormal Pap smear of cervix   . Headache    MIGRAINES    Current Outpatient Prescriptions on File Prior to Visit  Medication Sig  . levothyroxine (SYNTHROID, LEVOTHROID) 25 MCG tablet Take 1 tablet (25 mcg total) by mouth daily before breakfast.  . LORazepam (ATIVAN) 0.5 MG tablet Take 0.5 tablets (0.25 mg total) by mouth 2 (two) times daily.  . Venlafaxine HCl 37.5 MG TB24 Take 1 tablet by mouth at bedtime for 1 week and then increase to 2 tablets by mouth daily.  . Vitamin D, Ergocalciferol, (DRISDOL) 50000 units CAPS capsule Take 1 capsule (50,000 Units total) by mouth every 7 (seven) days.   No current facility-administered medications on file prior to visit.     Review of Systems  Constitutional: Negative for chills and fever.  HENT: Negative.   Respiratory: Negative for cough, chest tightness and wheezing.   Cardiovascular: Negative for chest pain and leg swelling.  Gastrointestinal: Positive  for abdominal pain and diarrhea. Negative for constipation, nausea and vomiting.  Endocrine: Negative.  Negative for cold intolerance, heat intolerance, polydipsia, polyphagia and polyuria.  Genitourinary: Positive for pelvic pain. Negative for decreased urine volume, difficulty urinating, dysuria, flank pain, urgency, vaginal bleeding, vaginal discharge and vaginal pain.  Musculoskeletal: Positive for back pain.  Neurological: Negative for dizziness, light-headedness and numbness.  Psychiatric/Behavioral: Negative.    Per HPI unless specifically indicated above     Objective:    BP 134/89 (BP Location: Left Arm, Patient Position: Sitting, Cuff Size: Large)   Pulse (!) 57   Temp 97.9 F (36.6 C) (Oral)   Resp 16   Ht 5' (1.524 m)   Wt 278 lb 6.4 oz (126.3 kg)   LMP 01/21/2016   BMI 54.37 kg/m   Wt Readings from Last 3 Encounters:  01/30/16 278 lb 6.4 oz (126.3 kg)  01/23/16 285 lb (129.3 kg)  10/01/15 278 lb (126.1 kg)    Physical Exam  Constitutional: She is oriented to person, place, and time. She appears well-developed and well-nourished.  HENT:  Head: Normocephalic and atraumatic.  Neck: Neck supple.  Cardiovascular: Normal rate, regular rhythm and normal heart sounds.  Exam reveals no gallop and no friction rub.   No murmur heard. Pulmonary/Chest: Effort normal and breath sounds normal. She has no wheezes. She exhibits no tenderness.  Abdominal: Soft. Normal appearance and bowel sounds are normal. She  exhibits no distension and no mass. There is no splenomegaly or hepatomegaly. There is tenderness in the right lower quadrant. There is no rigidity, no rebound, no guarding, no CVA tenderness, no tenderness at McBurney's point and negative Murphy's sign.  Genitourinary: Vagina normal and uterus normal. There is no rash, tenderness, lesion or injury on the right labia. There is no tenderness, lesion or injury on the left labia. Cervix exhibits no motion tenderness. Right adnexum  displays tenderness. Right adnexum displays no mass and no fullness. Left adnexum displays no mass, no tenderness and no fullness.  Musculoskeletal: Normal range of motion. She exhibits no edema or tenderness.  Lymphadenopathy:    She has no cervical adenopathy.  Neurological: She is alert and oriented to person, place, and time.  Skin: Skin is warm and dry.   Results for orders placed or performed in visit on 01/30/16  POCT Urinalysis Dipstick  Result Value Ref Range   Color, UA amber    Clarity, UA clear    Glucose, UA negative    Bilirubin, UA negative    Ketones, UA negative    Spec Grav, UA 1.020    Blood, UA negative    pH, UA 5.0    Protein, UA trace    Urobilinogen, UA negative    Nitrite, UA negative    Leukocytes, UA Negative Negative  POCT Wet Prep ([place])  Result Value Ref Range   Source Wet Prep POC     WBC, Wet Prep HPF POC     Bacteria Wet Prep HPF POC None None, Few, Too numerous to count   BACTERIA WET PREP MORPHOLOGY POC     Clue Cells Wet Prep HPF POC None None, Too numerous to count   Clue Cells Wet Prep Whiff POC     Yeast Wet Prep HPF POC None    KOH Wet Prep POC     Trichomonas Wet Prep HPF POC Absent Absent  POCT urine pregnancy  Result Value Ref Range   Preg Test, Ur Negative Negative      Assessment & Plan:   Problem List Items Addressed This Visit    None    Visit Diagnoses    Lower abdominal pain    -  Primary   Colicky pain- CBC and amylase, lipase to r/o GI cause. Possible subacute appendicitis Suspect GU- evolving kidney stone vs ovarian cyst. Bentyl to trial for pain. PRN vicodin for severe pain. Alarm symptoms reviewed. Recheck 2 weeks.    Relevant Medications   dicyclomine (BENTYL) 20 MG tablet   HYDROcodone-acetaminophen (NORCO/VICODIN) 5-325 MG tablet   Other Relevant Orders   POCT Urinalysis Dipstick (Completed)   POCT Wet Prep [facility]) (Completed)   POCT urine pregnancy (Completed)   CBC with Differential   BASIC  METABOLIC PANEL WITH GFR   Amylase   Lipase   US Transvaginal Non-OB   US Pelvis Limited   Pelvic pain in female       Transvaginal US to r/o cyst. PRN vicodin for severe pain. Alarm symptoms reviewed. Follow-up 2 weeks on current symptoms.       Meds ordered this encounter  Medications  . dicyclomine (BENTYL) 20 MG tablet    Sig: Take 1 tablet (20 mg total) by mouth 4 (four) times daily -  before meals and at bedtime.    Dispense:  60 tablet    Refill:  0    Order Specific Question:   Supervising Provider    Answer:  Arlis Porta [id number]  . HYDROcodone-acetaminophen (NORCO/VICODIN) 5-325 MG tablet    Sig: Take 1 tablet by mouth every 6 (six) hours as needed for moderate pain.    Dispense:  20 tablet    Refill:  0    Order Specific Question:   Supervising Provider    Answer:   Arlis Porta [id number]      Follow up plan: Return in about 2 weeks (around 02/13/2016).

## 2016-01-31 ENCOUNTER — Encounter: Payer: Self-pay | Admitting: Family Medicine

## 2016-02-02 ENCOUNTER — Ambulatory Visit
Admission: RE | Admit: 2016-02-02 | Discharge: 2016-02-02 | Disposition: A | Discharge status: Home / Self Care | Payer: Commercial Managed Care - PPO | Source: Ambulatory Visit | Attending: Family Medicine | Admitting: Family Medicine

## 2016-02-02 DIAGNOSIS — R1030 Lower abdominal pain, unspecified: Secondary | ICD-10-CM | POA: Insufficient documentation

## 2016-02-03 ENCOUNTER — Telehealth: Payer: Self-pay | Admitting: Family Medicine

## 2016-02-03 DIAGNOSIS — R1031 Right lower quadrant pain: Secondary | ICD-10-CM

## 2016-02-03 MED ORDER — METAXALONE 800 MG PO TABS: 800.0000 mg | ORAL_TABLET | Freq: Three times a day (TID) | ORAL | 0 refills | Status: DC

## 2016-02-03 NOTE — Telephone Encounter (Signed)
Pain is still present. RLQ is painful. Having some nausea this AM.  Has tried bentyl- did not help with pain.  Has not tried vicodin.  Pain is central to R groin. No fevers. No blood in urine. No change in BM.  Will plan to order CT. Pt will go to ER over the weekend if symptoms worsen.

## 2016-02-06 ENCOUNTER — Other Ambulatory Visit: Payer: Medicaid Other

## 2016-02-07 ENCOUNTER — Encounter: Payer: Self-pay | Admitting: Family Medicine

## 2016-02-09 ENCOUNTER — Other Ambulatory Visit: Payer: Self-pay | Admitting: Family Medicine

## 2016-02-09 ENCOUNTER — Encounter: Payer: Self-pay | Admitting: Family Medicine

## 2016-02-09 DIAGNOSIS — N390 Urinary tract infection, site not specified: Secondary | ICD-10-CM

## 2016-02-09 LAB — POCT URINALYSIS DIPSTICK
BILIRUBIN UA: NEGATIVE
Blood, UA: NEGATIVE
GLUCOSE UA: NEGATIVE
Ketones, UA: NEGATIVE
LEUKOCYTES UA: NEGATIVE
NITRITE UA: NEGATIVE
Protein, UA: NEGATIVE
Spec Grav, UA: 1.015
UROBILINOGEN UA: NEGATIVE
pH, UA: 5

## 2016-02-10 ENCOUNTER — Telehealth: Payer: Self-pay | Admitting: Family Medicine

## 2016-02-10 NOTE — Telephone Encounter (Signed)
Pt is reporting blurred vision and "tunnel vision"  Lasted about 2 hours today. She was at work. It also occurred yesterday afternoon.   Occurred yesterday.  Has been having a headache. She reports she keeps a headache- daily headaches have occurred for years.  She is concerned that it could be her venlafaxine. Advised pt that it could be venlafaxine but it could also be a symptom of another issue. Reduce venlfaxine dose to 37.5mg . If symptoms continue over the weekend- she needs to be seen in ER or urgent care especially considering the headaches.

## 2016-02-10 NOTE — Telephone Encounter (Signed)
Pt is having tunnel vision possibly a side effect of medications.  Please call [phone]

## 2016-02-13 ENCOUNTER — Ambulatory Visit (INDEPENDENT_AMBULATORY_CARE_PROVIDER_SITE_OTHER): Payer: Commercial Managed Care - PPO | Admitting: Family Medicine

## 2016-02-13 VITALS — BP 124/56 | HR 64 | Temp 98.2°F | Resp 16 | Ht 60.0 in | Wt 280.0 lb

## 2016-02-13 DIAGNOSIS — E034 Atrophy of thyroid (acquired): Secondary | ICD-10-CM | POA: Diagnosis not present

## 2016-02-13 DIAGNOSIS — N3000 Acute cystitis without hematuria: Secondary | ICD-10-CM

## 2016-02-13 DIAGNOSIS — R1084 Generalized abdominal pain: Secondary | ICD-10-CM | POA: Diagnosis not present

## 2016-02-13 DIAGNOSIS — R109 Unspecified abdominal pain: Secondary | ICD-10-CM | POA: Diagnosis not present

## 2016-02-13 DIAGNOSIS — F418 Other specified anxiety disorders: Secondary | ICD-10-CM | POA: Diagnosis not present

## 2016-02-13 DIAGNOSIS — F329 Major depressive disorder, single episode, unspecified: Secondary | ICD-10-CM

## 2016-02-13 DIAGNOSIS — E038 Other specified hypothyroidism: Secondary | ICD-10-CM

## 2016-02-13 DIAGNOSIS — E039 Hypothyroidism, unspecified: Secondary | ICD-10-CM | POA: Insufficient documentation

## 2016-02-13 DIAGNOSIS — F419 Anxiety disorder, unspecified: Secondary | ICD-10-CM

## 2016-02-13 DIAGNOSIS — F32A Depression, unspecified: Secondary | ICD-10-CM

## 2016-02-13 LAB — TSH: TSH: 4.69 m[IU]/L — AB

## 2016-02-13 LAB — BASIC METABOLIC PANEL WITH GFR
BUN: 13 mg/dL (ref 7–25)
CALCIUM: 8.9 mg/dL (ref 8.6–10.2)
CO2: 24 mmol/L (ref 20–31)
Chloride: 107 mmol/L (ref 98–110)
Creat: 0.8 mg/dL (ref 0.50–1.10)
Glucose, Bld: 80 mg/dL (ref 65–99)
POTASSIUM: 4 mmol/L (ref 3.5–5.3)
SODIUM: 139 mmol/L (ref 135–146)

## 2016-02-13 LAB — CULTURE, URINE COMPREHENSIVE

## 2016-02-13 MED ORDER — CIPROFLOXACIN HCL 500 MG PO TABS: 500.0000 mg | ORAL_TABLET | Freq: Two times a day (BID) | ORAL | 0 refills | Status: DC

## 2016-02-13 NOTE — Progress Notes (Signed)
Subjective:    Patient ID: Leah Osborne, female    DOB: 04/25/1986, 30 y.o.   MRN: [id number]  HPI: Leah Osborne is a 30 y.o. female presenting on 02/13/2016 for Depression (improved) and Hypothyroidism (needs refill)   HPI  Pt presents for follow-up of back and abdominal pain. The pain has moved totally to her back now. She did drop of a urine sample last week- positive for UTI on culture. No fevers over the weekend. Pain is constant now- no longer colicky. 6/10. No blood in urine. Pain is located on both the L and R side low back. Some nausea- thinks it is medication related. Treating pain with ibuprofen and tylenol- takes the edge off the pain.  Did have an episode of tunnel vision- possible reaction to venlafaxine. Feels better on 75mg  of venlafaxine. Would like to continue. Feels it is working.  No further episodes of tunnel vision.   Past Medical History:  Diagnosis Date  . Abnormal Pap smear of cervix   . Headache    MIGRAINES    Current Outpatient Prescriptions on File Prior to Visit  Medication Sig  . HYDROcodone-acetaminophen (NORCO/VICODIN) 5-325 MG tablet Take 1 tablet by mouth every 6 (six) hours as needed for moderate pain.  Marland Kitchen levothyroxine (SYNTHROID, LEVOTHROID) 25 MCG tablet Take 1 tablet (25 mcg total) by mouth daily before breakfast.  . LORazepam (ATIVAN) 0.5 MG tablet Take 0.5 tablets (0.25 mg total) by mouth 2 (two) times daily.  . Venlafaxine HCl 37.5 MG TB24 Take 1 tablet by mouth at bedtime for 1 week and then increase to 2 tablets by mouth daily.  . Vitamin D, Ergocalciferol, (DRISDOL) 50000 units CAPS capsule Take 1 capsule (50,000 Units total) by mouth every 7 (seven) days.   No current facility-administered medications on file prior to visit.     Review of Systems  Constitutional: Negative for chills and fever.  HENT: Negative.   Eyes: Negative for visual disturbance.  Respiratory: Negative for cough, chest tightness and wheezing.     Cardiovascular: Negative for chest pain and leg swelling.  Gastrointestinal: Positive for nausea. Negative for abdominal pain, constipation, diarrhea and vomiting.  Endocrine: Negative.  Negative for cold intolerance, heat intolerance, polydipsia, polyphagia and polyuria.  Genitourinary: Positive for difficulty urinating, flank pain and frequency. Negative for decreased urine volume, dysuria and urgency.  Musculoskeletal: Positive for back pain.  Neurological: Negative for dizziness, light-headedness and numbness.  Psychiatric/Behavioral: Negative.    Per HPI unless specifically indicated above     Objective:    BP (!) 124/56 (BP Location: Left Arm, Patient Position: Sitting, Cuff Size: Large)   Pulse 64   Temp 98.2 F (36.8 C) (Oral)   Resp 16   Ht 5' (1.524 m)   Wt 280 lb (127 kg)   LMP 01/21/2016   BMI 54.68 kg/m   Wt Readings from Last 3 Encounters:  02/13/16 280 lb (127 kg)  01/30/16 278 lb 6.4 oz (126.3 kg)  01/23/16 285 lb (129.3 kg)    Physical Exam  Constitutional: She is oriented to person, place, and time. She appears well-developed and well-nourished.  HENT:  Head: Normocephalic and atraumatic.  Neck: Neck supple.  Cardiovascular: Normal rate, regular rhythm and normal heart sounds.  Exam reveals no gallop and no friction rub.   No murmur heard. Pulmonary/Chest: Effort normal and breath sounds normal. She has no wheezes. She exhibits no tenderness.  Abdominal: Soft. Normal appearance and bowel sounds are normal. She exhibits no  distension and no mass. There is no hepatosplenomegaly. There is no tenderness. There is CVA tenderness (L sided). There is no rebound and no guarding.  Musculoskeletal: Normal range of motion. She exhibits no edema or tenderness.       Lumbar back: Normal.  Lymphadenopathy:    She has no cervical adenopathy.  Neurological: She is alert and oriented to person, place, and time.  Skin: Skin is warm and dry.   Results for orders placed or  performed in visit on 02/09/16  CULTURE, URINE COMPREHENSIVE  Result Value Ref Range   Culture ENTEROBACTER AEROGENES    Colony Count 50,000-100,000 CFU/mL    Organism ID, Bacteria ENTEROBACTER AEROGENES       Susceptibility   Enterobacter aerogenes -  (no method available)    AMOX/CLAVULANIC  Resistant     PIP/TAZO <=4 Sensitive     IMIPENEM 0.5 Sensitive     CEFAZOLIN  Resistant     CEFTRIAXONE <=1 Sensitive     CEFTAZIDIME <=1 Sensitive     CEFEPIME <=1 Sensitive     GENTAMICIN <=1 Sensitive     TOBRAMYCIN <=1 Sensitive     CIPROFLOXACIN <=0.25 Sensitive     LEVOFLOXACIN <=0.12 Sensitive     NITROFURANTOIN 64 Intermediate     TRIMETH/SULFA* <=20 Sensitive      * NR=NOT REPORTABLE,SEE COMMENTORAL therapy:A cefazolin MIC of <32 predicts susceptibility to the oral agents cefaclor,cefdinir,cefpodoxime,cefprozil,cefuroxime,cephalexin,and loracarbef when used for therapy of uncomplicated UTIs due to E.coli,K.pneumomiae,and P.mirabilis. PARENTERAL therapy: A cefazolinMIC of >8 indicates resistance to parenteralcefazolin. An alternate test method must beperformed to confirm susceptibility to parenteralcefazolin.  POCT Urinalysis Dipstick  Result Value Ref Range   Color, UA amber    Clarity, UA yellow    Glucose, UA negative    Bilirubin, UA negative    Ketones, UA negative    Spec Grav, UA 1.015    Blood, UA negative    pH, UA 5.0    Protein, UA negative    Urobilinogen, UA negative    Nitrite, UA negative    Leukocytes, UA Negative Negative      Assessment & Plan:   Problem List Items Addressed This Visit      Endocrine   Hypothyroidism    Recheck TSH level since starting treatment.       Relevant Orders   BASIC METABOLIC PANEL WITH GFR   TSH     Other   Anxiety and depression    Much improved since starting venlafaxine. Continue current dose. Recheck 3 mos.        Other Visit Diagnoses    Acute cystitis without hematuria    -  Primary   UC positive. Will treat  with cipro- treat 5 days and consider extending treatment to 7 based on CT scan tomorrow. Alarm symptoms reviewed.    Relevant Medications   ciprofloxacin (CIPRO) 500 MG tablet   Left flank pain       Possibily 2/2 infection. Will treat with cipro- consider extension pending CT scan tomorrow. Alarm symptoms reviewed. Possible horseshoe kidney- could explain movement of pain. Follow-up pending CT scan.    Generalized abdominal pain       Pain is now centered in the back. Due to the movement of center of the pain will continue with CT of abdomen and pelvis to r/o GI abnormality as well as GU.       Meds ordered this encounter  Medications  . ciprofloxacin (CIPRO) 500 MG tablet  Sig: Take 1 tablet (500 mg total) by mouth 2 (two) times daily.    Dispense:  10 tablet    Refill:  0    Order Specific Question:   Supervising Provider    Answer:   Arlis Porta [id number]      Follow up plan: Return if symptoms worsen or fail to improve.

## 2016-02-13 NOTE — Patient Instructions (Signed)
We will proceed with the a CT scan tomorrow to determine the cause of your symptoms.  We will plan at that point to send you to urology for further work-up.  You are being treated for a urinary tract infection today. Please take your antibiotic as directed. If you develop severe flank pain, blood in the urine, fever, nausea or vomiting, please seek immediate medical attention in the ER.   Continue on the venlafaxine.  If you have any more tunnel vision- please seek immediate medical attention.

## 2016-02-13 NOTE — Assessment & Plan Note (Signed)
Recheck TSH level since starting treatment.

## 2016-02-13 NOTE — Assessment & Plan Note (Signed)
Much improved since starting venlafaxine. Continue current dose. Recheck 3 mos.

## 2016-02-14 ENCOUNTER — Ambulatory Visit
Admission: RE | Admit: 2016-02-14 | Discharge: 2016-02-14 | Disposition: A | Discharge status: Home / Self Care | Payer: Commercial Managed Care - PPO | Source: Ambulatory Visit | Attending: Family Medicine | Admitting: Family Medicine

## 2016-02-14 ENCOUNTER — Telehealth: Payer: Self-pay | Admitting: Family Medicine

## 2016-02-14 DIAGNOSIS — R1031 Right lower quadrant pain: Secondary | ICD-10-CM | POA: Diagnosis not present

## 2016-02-14 DIAGNOSIS — Q631 Lobulated, fused and horseshoe kidney: Secondary | ICD-10-CM | POA: Diagnosis not present

## 2016-02-14 DIAGNOSIS — N83202 Unspecified ovarian cyst, left side: Secondary | ICD-10-CM | POA: Insufficient documentation

## 2016-02-14 DIAGNOSIS — R1030 Lower abdominal pain, unspecified: Secondary | ICD-10-CM | POA: Diagnosis present

## 2016-02-14 MED ORDER — IOPAMIDOL (ISOVUE-370) INJECTION 76%
100.0000 mL | Freq: Once | INTRAVENOUS | Status: AC | PRN
  Administered 2016-02-14: 100 mL via INTRAVENOUS

## 2016-02-14 NOTE — Telephone Encounter (Signed)
Reviewed CT scan with patient. Largely unremarkable. 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 Encouraged pt to give Abx for UTI a few more days to determine if there is a change in symptoms. If urinary issues do not resolve- we will have her seen by urology to discuss horseshoe kidney.  Plan for PT to help with posture correction and NSAIDs for back pain.

## 2016-02-15 ENCOUNTER — Encounter: Payer: Self-pay | Admitting: Family Medicine

## 2016-02-16 ENCOUNTER — Other Ambulatory Visit: Payer: Self-pay | Admitting: Family Medicine

## 2016-02-16 DIAGNOSIS — N3000 Acute cystitis without hematuria: Secondary | ICD-10-CM

## 2016-02-16 MED ORDER — CIPROFLOXACIN HCL 500 MG PO TABS: 500.0000 mg | ORAL_TABLET | Freq: Two times a day (BID) | ORAL | 0 refills | Status: DC

## 2016-02-20 ENCOUNTER — Encounter: Payer: Self-pay | Admitting: Family Medicine

## 2016-02-21 ENCOUNTER — Other Ambulatory Visit: Payer: Self-pay | Admitting: Family Medicine

## 2016-02-21 DIAGNOSIS — Q631 Lobulated, fused and horseshoe kidney: Secondary | ICD-10-CM | POA: Insufficient documentation

## 2016-02-21 DIAGNOSIS — R3911 Hesitancy of micturition: Secondary | ICD-10-CM

## 2016-02-27 ENCOUNTER — Ambulatory Visit: Payer: MEDICAID

## 2016-02-27 ENCOUNTER — Encounter: Payer: Self-pay | Admitting: Family Medicine

## 2016-02-27 ENCOUNTER — Ambulatory Visit (INDEPENDENT_AMBULATORY_CARE_PROVIDER_SITE_OTHER): Payer: Commercial Managed Care - PPO | Admitting: Family Medicine

## 2016-02-27 VITALS — BP 112/68 | HR 67 | Temp 98.4°F | Resp 16 | Ht 60.0 in | Wt 279.4 lb

## 2016-02-27 DIAGNOSIS — M8538 Osteitis condensans, other site: Secondary | ICD-10-CM | POA: Diagnosis not present

## 2016-02-27 DIAGNOSIS — F419 Anxiety disorder, unspecified: Principal | ICD-10-CM

## 2016-02-27 DIAGNOSIS — M545 Low back pain, unspecified: Secondary | ICD-10-CM

## 2016-02-27 DIAGNOSIS — E038 Other specified hypothyroidism: Secondary | ICD-10-CM

## 2016-02-27 DIAGNOSIS — F329 Major depressive disorder, single episode, unspecified: Secondary | ICD-10-CM

## 2016-02-27 DIAGNOSIS — E034 Atrophy of thyroid (acquired): Secondary | ICD-10-CM

## 2016-02-27 DIAGNOSIS — F32A Depression, unspecified: Secondary | ICD-10-CM

## 2016-02-27 DIAGNOSIS — F418 Other specified anxiety disorders: Secondary | ICD-10-CM | POA: Diagnosis not present

## 2016-02-27 LAB — BASIC METABOLIC PANEL WITH GFR
BUN: 16 mg/dL (ref 7–25)
CHLORIDE: 106 mmol/L (ref 98–110)
CO2: 27 mmol/L (ref 20–31)
Calcium: 8.9 mg/dL (ref 8.6–10.2)
Creat: 0.82 mg/dL (ref 0.50–1.10)
GFR, Est African American: >89 mL/min (ref >=60)
GFR, Est Non African American: >89 mL/min (ref >=60)
Glucose, Bld: 88 mg/dL (ref 65–99)
POTASSIUM: 4.4 mmol/L (ref 3.5–5.3)
Sodium: 141 mmol/L (ref 135–146)

## 2016-02-27 NOTE — Assessment & Plan Note (Signed)
Recheck TSH after 2 mos of therapy. To determine if dose is appropriate.

## 2016-02-27 NOTE — Patient Instructions (Signed)
If you develop progressive numbness, weakness, loss of feeling in your pelvis, or loss of bowel/bladder control please seek immediate medical attention.   Let's try physical therapy to help with the back pain. If that does not help, let's have you follow-up with Dr. Keturah Barre for further work-up.

## 2016-02-27 NOTE — Progress Notes (Signed)
Subjective:    Patient ID: Leah Osborne, female    DOB: 04/20/1986, 30 y.o.   MRN: [id number]  HPI: Leah Osborne is a 30 y.o. female presenting on 02/27/2016 for Follow-up (lab)   HPI  Pt presents for follow-up of depression. Taking 75mg  of venlafaxine daily. Is doing well. No tunnel vision. Taking ativan as needed. Takes sparingly.  Feels she can function betters.  Back pain- lower back pain. CT scan just showed a illitis condensans. Pain does radiate around to the pelvis. LMP last Tuesday. Pain does radiate on her period. Back pain is always present. Take 600-800mg  motrin q-4-6 hours.   Past Medical History:  Diagnosis Date  . Abnormal Pap smear of cervix   . Headache    MIGRAINES    Current Outpatient Prescriptions on File Prior to Visit  Medication Sig  . HYDROcodone-acetaminophen (NORCO/VICODIN) 5-325 MG tablet Take 1 tablet by mouth every 6 (six) hours as needed for moderate pain.  Marland Kitchen levothyroxine (SYNTHROID, LEVOTHROID) 25 MCG tablet Take 1 tablet (25 mcg total) by mouth daily before breakfast.  . LORazepam (ATIVAN) 0.5 MG tablet Take 0.5 tablets (0.25 mg total) by mouth 2 (two) times daily.  . Venlafaxine HCl 37.5 MG TB24 Take 1 tablet by mouth at bedtime for 1 week and then increase to 2 tablets by mouth daily.  . Vitamin D, Ergocalciferol, (DRISDOL) 50000 units CAPS capsule Take 1 capsule (50,000 Units total) by mouth every 7 (seven) days.   No current facility-administered medications on file prior to visit.     Review of Systems  Constitutional: Negative for chills and fever.  HENT: Negative.   Respiratory: Negative for cough, chest tightness and wheezing.   Cardiovascular: Negative for chest pain and leg swelling.  Gastrointestinal: Negative for abdominal pain, constipation, diarrhea, nausea and vomiting.  Endocrine: Negative.  Negative for cold intolerance, heat intolerance, polydipsia, polyphagia and polyuria.  Genitourinary: Negative for difficulty  urinating and dysuria.  Musculoskeletal: Negative.   Neurological: Negative for dizziness, light-headedness and numbness.  Psychiatric/Behavioral: Negative.    Per HPI unless specifically indicated above     Objective:    BP 112/68 (BP Location: Left Arm, Patient Position: Sitting, Cuff Size: Large)   Pulse 67   Temp 98.4 F (36.9 C) (Oral)   Resp 16   Ht 5' (1.524 m)   Wt 279 lb 6.4 oz (126.7 kg)   LMP 01/21/2016 Comment: no chance of preg per pt  BMI 54.57 kg/m   Wt Readings from Last 3 Encounters:  02/27/16 279 lb 6.4 oz (126.7 kg)  02/13/16 280 lb (127 kg)  01/30/16 278 lb 6.4 oz (126.3 kg)    GAD 7 : Generalized Anxiety Score 02/27/2016 01/23/2016  Nervous, Anxious, on Edge 1 3  Control/stop worrying 1 2  Worry too much - different things 1 3  Trouble relaxing 1 3  Restless 1 3  Easily annoyed or irritable 1 3  Afraid - awful might happen 1 3  Total GAD 7 Score 7 20  Anxiety Difficulty - Extremely difficult     Physical Exam  Constitutional: She is oriented to person, place, and time. She appears well-developed and well-nourished.  HENT:  Head: Normocephalic and atraumatic.  Neck: Neck supple.  Cardiovascular: Normal rate, regular rhythm and normal heart sounds.  Exam reveals no gallop and no friction rub.   No murmur heard. Pulmonary/Chest: Effort normal and breath sounds normal. She has no wheezes. She exhibits no tenderness.  Abdominal: Soft. Normal  appearance and bowel sounds are normal. She exhibits no distension and no mass. There is no tenderness. There is no rebound and no guarding.  Musculoskeletal: Normal range of motion. She exhibits no edema.       Lumbar back: She exhibits tenderness. She exhibits no swelling, no pain and normal pulse.  Lymphadenopathy:    She has no cervical adenopathy.  Neurological: She is alert and oriented to person, place, and time.  Skin: Skin is warm and dry.   Results for orders placed or performed in visit on [id number]    BASIC METABOLIC PANEL WITH GFR  Result Value Ref Range   Sodium 139 135 - 146 mmol/L   Potassium 4.0 3.5 - 5.3 mmol/L   Chloride 107 98 - 110 mmol/L   CO2 24 20 - 31 mmol/L   Glucose, Bld 80 65 - 99 mg/dL   BUN 13 7 - 25 mg/dL   Creat 0.80 0.50 - 1.10 mg/dL   Calcium 8.9 8.6 - 10.2 mg/dL   GFR, Est African American >89 >=60 mL/min   GFR, Est Non African American >89 >=60 mL/min  TSH  Result Value Ref Range   TSH 4.69 (H) mIU/L      Assessment & Plan:   Problem List Items Addressed This Visit      Endocrine   Hypothyroidism    Recheck TSH after 2 mos of therapy. To determine if dose is appropriate.       Relevant Orders   TSH     Musculoskeletal and Integument   Osteitis condensans ilii    Possible source of symptoms given that she recently had a child and it is a risk factor. Recommend PT to help with pain.         Other   Anxiety and depression - Primary    Doing well on venlafaxine 75mg  once daily. Tolerating medication well. Very sparing use of ativan. Recheck 3 mos.        Other Visit Diagnoses    Midline low back pain without sciatica       Refer to PT- [facility] slip given for self referral. Continue motrin as needed for pain.    Relevant Orders   BASIC METABOLIC PANEL WITH GFR      No orders of the defined types were placed in this encounter.     Follow up plan: Return in about 3 months (around 05/28/2016), or if symptoms worsen or fail to improve.

## 2016-02-27 NOTE — Assessment & Plan Note (Signed)
Doing well on venlafaxine 75mg  once daily. Tolerating medication well. Very sparing use of ativan. Recheck 3 mos.

## 2016-02-27 NOTE — Assessment & Plan Note (Signed)
Possible source of symptoms given that she recently had a child and it is a risk factor. Recommend PT to help with pain.

## 2016-02-28 LAB — TSH: TSH: 3.3 m[IU]/L

## 2016-03-05 ENCOUNTER — Encounter: Payer: Self-pay | Admitting: Family Medicine

## 2016-03-05 ENCOUNTER — Ambulatory Visit (INDEPENDENT_AMBULATORY_CARE_PROVIDER_SITE_OTHER): Payer: Commercial Managed Care - PPO | Admitting: Family Medicine

## 2016-03-05 VITALS — BP 136/62 | HR 93 | Temp 100.0°F | Resp 16 | Ht 60.0 in | Wt 278.6 lb

## 2016-03-05 DIAGNOSIS — J029 Acute pharyngitis, unspecified: Secondary | ICD-10-CM

## 2016-03-05 DIAGNOSIS — J020 Streptococcal pharyngitis: Secondary | ICD-10-CM | POA: Diagnosis not present

## 2016-03-05 LAB — POCT RAPID STREP A (OFFICE): RAPID STREP A SCREEN: POSITIVE — AB

## 2016-03-05 MED ORDER — AMOXICILLIN 500 MG PO CAPS: 500.0000 mg | ORAL_CAPSULE | Freq: Two times a day (BID) | ORAL | 0 refills | Status: DC

## 2016-03-05 NOTE — Progress Notes (Signed)
Subjective:    Patient ID: Leah Osborne, female    DOB: 04/15/1986, 30 y.o.   MRN: [id number]  Leah Osborne is a 30 y.o. female presenting on 03/05/2016 for Sore Throat (gland swollen not sure about fever dizzy throwing up also had back pain not sure throwing up related with back pain)  HPI   SORE THROAT / Nausea/Vomiting: - Reports symptoms started last night with low back pain after finishing work, she attempted to shower to relieve her back, and developed some sore throat, nausea, vomiting, and chills. Did not check temp last night. No known sick contacts. Has infant at home that is well currently. She has had prior strep throat before similar symptoms. Possible sick contacts at work [employer] [employer] [facility] [employer] [facility] in [place]). Tolerating some liquids but reduced appetite. - Recent antibiotic course with cipro for a UTI, since resolved, >2 weeks ago - No prior history of C Diff - Admits some subjective fevers (took Aleve recently within 1-2 hr prior to visit today) - Denies any abdominal pain, dysphagia or difficulty swallowing, persistent vomiting, diarrhea / constipation, productive cough, congestion, headache, sinus pressure, ear pain, chest pain  Social History  Substance Use Topics  . Smoking status: Never Smoker  . Smokeless tobacco: Never Used  . Alcohol use No    Review of Systems Per HPI unless specifically indicated above     Objective:    BP 136/62   Pulse 93   Temp 100 F (37.8 C) (Oral)   Resp 16   Ht 5' (1.524 m)   Wt 278 lb 9.6 oz (126.4 kg)   LMP 01/21/2016 Comment: no chance of preg per pt  SpO2 100%   BMI 54.41 kg/m   Wt Readings from Last 3 Encounters:  03/05/16 278 lb 9.6 oz (126.4 kg)  02/27/16 279 lb 6.4 oz (126.7 kg)  02/13/16 280 lb (127 kg)    Physical Exam  Constitutional: She appears well-developed and well-nourished. No distress.  Currently ill appearing but non-toxic, comfortable and cooperative  HENT:  Head: Normocephalic and atraumatic.    Nares patent Oropharynx moist with bilateral symmetrical tonsillar edema, erythema, and exudates.  Eyes: Conjunctivae are normal.  Neck: Normal range of motion. Neck supple.  Cardiovascular: Normal rate, regular rhythm, normal heart sounds and intact distal pulses.   No murmur heard. Pulmonary/Chest: Effort normal and breath sounds normal. No respiratory distress. She has no wheezes. She has no rales.  Abdominal: Soft. Bowel sounds are normal. She exhibits no distension. There is no tenderness.  Musculoskeletal: She exhibits no edema.  Lymphadenopathy:    She has cervical adenopathy.  Neurological: She is alert.  Skin: Skin is warm and dry. No rash noted. She is not diaphoretic.  Nursing note and vitals reviewed.  Results for orders placed or performed in visit on 03/05/16  POCT rapid strep A  Result Value Ref Range   Rapid Strep A Screen Positive (A) Negative      Assessment & Plan:   Problem List Items Addressed This Visit    None    Visit Diagnoses    Acute streptococcal pharyngitis    -  Primary  Consistent with acute Grp A Strep pharyngitis, with constellation of symptoms (likely sick contact at work in hospital). Similar prior history of strep. Low-grade temp 100F (s/p NSAID). Clinically likely strep throat with exam and rapid strep positive. No other obvious source on exam. Concern with recent Cipro for UTI course of risk of more antibiotics, however no  prior C Diff.  Plan: 1. Rapid strep positive today 2. Stat Amoxicillin 500mg  BID x 10 days 3. Routine conservative care with hydration, anti-pyretic 4. Work note out tomorrow for 24 hr on antibiotics, may return 03/07/16 5. Follow-up within 1-2 weeks PRN worsening / return criteria - specifically counseled on if C Diff symptoms with watery diarrhea, should stop Amox and follow-up     Relevant Medications   amoxicillin (AMOXIL) 500 MG capsule   Sore throat       Relevant Orders   POCT rapid strep A (Completed)         Meds ordered this encounter  Medications  . amoxicillin (AMOXIL) 500 MG capsule    Sig: Take 1 capsule (500 mg total) by mouth 2 (two) times daily. For 10 days    Dispense:  20 capsule    Refill:  0      Follow up plan: Return in about 2 years (around 03/05/2018), or if symptoms worsen or fail to improve, for strep throat.  Leah Putnam, DO [facility] 03/05/2016, 5:06 PM

## 2016-03-05 NOTE — Patient Instructions (Signed)
Thank you for coming in to clinic today.  1. Rapid strep positive for Group A Strep, likely causing all of your symptoms including nausea / vomiting - Take Amoxicillin 500mg  twice a day for 10 days - As discussed if you develop watery diarrhea or significant abdominal discomfort with loose stools, stop taking Amoxicillin and follow-up sooner  Continue to stay well hydrated Take anti-inflammatory / tylenol as needed  Please schedule a follow-up appointment with Amy Krebs in 1-2 weeks if not improving, or new symptoms   If you have any other questions or concerns, please feel free to call the clinic or send a message through [place]. You may also schedule an earlier appointment if necessary.  Nobie Putnam, DO [place]

## 2016-03-08 ENCOUNTER — Encounter: Payer: Self-pay | Admitting: Family Medicine

## 2016-03-08 ENCOUNTER — Other Ambulatory Visit: Payer: Self-pay | Admitting: Family Medicine

## 2016-03-08 DIAGNOSIS — J020 Streptococcal pharyngitis: Secondary | ICD-10-CM

## 2016-03-08 MED ORDER — CEFUROXIME AXETIL 250 MG PO TABS: 250.0000 mg | ORAL_TABLET | Freq: Two times a day (BID) | ORAL | 0 refills | Status: DC

## 2016-03-08 NOTE — Progress Notes (Signed)
See MyChart message conversation, patient still with breakthrough fevers on Amoxicillin and still symptomatic, household contact son with Strep throat positive. Discussion and patient opt for alternative antibiotic, sent Cefuroxime 250mg  BID x 10 days for 1st gen cephalosporin antibiotic, ONLY to start if no improvement after 5-7 days on Amoxicillin.  Nobie Putnam, [facility] 03/08/2016, 12:15 PM

## 2016-03-12 ENCOUNTER — Ambulatory Visit (INDEPENDENT_AMBULATORY_CARE_PROVIDER_SITE_OTHER): Payer: Commercial Managed Care - PPO | Admitting: Urology

## 2016-03-12 ENCOUNTER — Encounter: Payer: Self-pay | Admitting: Urology

## 2016-03-12 VITALS — BP 113/82 | HR 111 | Ht 60.0 in | Wt 275.4 lb

## 2016-03-12 DIAGNOSIS — R351 Nocturia: Secondary | ICD-10-CM

## 2016-03-12 DIAGNOSIS — N393 Stress incontinence (female) (male): Secondary | ICD-10-CM | POA: Diagnosis not present

## 2016-03-12 DIAGNOSIS — N8110 Cystocele, unspecified: Secondary | ICD-10-CM | POA: Diagnosis not present

## 2016-03-12 DIAGNOSIS — IMO0002 Reserved for concepts with insufficient information to code with codable children: Secondary | ICD-10-CM

## 2016-03-12 DIAGNOSIS — IMO0001 Reserved for inherently not codable concepts without codable children: Secondary | ICD-10-CM

## 2016-03-12 DIAGNOSIS — Q631 Lobulated, fused and horseshoe kidney: Secondary | ICD-10-CM

## 2016-03-12 DIAGNOSIS — R3911 Hesitancy of micturition: Secondary | ICD-10-CM

## 2016-03-12 LAB — BLADDER SCAN AMB NON-IMAGING: SCAN RESULT: 0

## 2016-03-12 NOTE — Progress Notes (Signed)
03/12/2016 9:39 AM   Leah Osborne 06-02-1986 [id number]  Referring provider: Luciana Axe, NP [place], [place] [address]  Chief Complaint  Patient presents with  . New Patient (Initial Visit)    urinary hestiancy, horseshoe kidney     HPI: Patient is a 30 year old Caucasian female who referred to [place] for urinary hesitancy and a horseshoe kidney by her PCP, Leah Overton Mam, NP.    Patient states that she has had urinary incontinence since she has had her children.  Her most recent child was born in 05/2015.    Patient has incontinence with coughing, laughing, sneezing, etc.   She is experiencing several incontinent episodes during the day. She is experiencing nocturia x 3.   Her incontinence volume is moderate.   She is wearing 2 Always pads daily.    She is having associated urinary frequency, urgency, nocturia, intermittency and weak urinary stream.   She is not having associated dysuria, hesitancy or a weak urinary stream.   She does not have a history of urinary tract infections, STI's or injury to the bladder.   She denies dysuria, gross hematuria, suprapubic pain, back pain, abdominal pain or flank pain.  She has not had any recent fevers, chills, nausea or vomiting.   She does not have a history of nephrolithiasis, GU surgery or GU trauma.   She denies constipation and/or diarrhea.  She is not having pain with bladder filling.   She has had two CT with contrast which noted the incidental findings of a horseshoe kidney.    She is drinking 3 bottles of water daily.   She is drinking 1 to 2 caffeinated beverages daily.  She is drinking no alcoholic beverages daily.    Her risk factors for incontinence are obesity, vaginal delivery, a family history of incontinence, caffeine and depression.  She is taking benzodiazepines and antidepressants.    Her PVR at today's visit was 0 mL.     PMH: Past Medical History:  Diagnosis Date  . Abnormal Pap smear of  cervix   . Headache    MIGRAINES    Surgical History: Past Surgical History:  Procedure Laterality Date  . CHOLECYSTECTOMY    . LAPAROSCOPIC TUBAL LIGATION Bilateral 09/19/2015   Procedure: LAPAROSCOPIC BILATERAL TUBAL BANDING;  Surgeon: Brayton Mars, MD;  Location: [facility] ORS;  Service: Gynecology;  Laterality: Bilateral;    Home Medications:    Medication List       Accurate as of 03/12/16  9:39 AM. Always use your most recent med list.          amoxicillin 500 MG capsule Commonly known as:  AMOXIL Take 500 mg by mouth 2 (two) times daily.   HYDROcodone-acetaminophen 5-325 MG tablet Commonly known as:  NORCO/VICODIN Take 1 tablet by mouth every 6 (six) hours as needed for moderate pain.   levothyroxine 25 MCG tablet Commonly known as:  SYNTHROID, LEVOTHROID Take 1 tablet (25 mcg total) by mouth daily before breakfast.   LORazepam 0.5 MG tablet Commonly known as:  ATIVAN Take 0.5 tablets (0.25 mg total) by mouth 2 (two) times daily.   Venlafaxine HCl 37.5 MG Tb24 Take 1 tablet by mouth at bedtime for 1 week and then increase to 2 tablets by mouth daily.   Vitamin D (Ergocalciferol) 50000 units Caps capsule Commonly known as:  DRISDOL Take 1 capsule (50,000 Units total) by mouth every 7 (seven) days.  Allergies: No Known Allergies  Family History: Family History  Problem Relation Age of Onset  . Diabetes Maternal Grandmother   . Heart disease Maternal Grandmother   . Cancer Neg Hx     Social History:  reports that she has never smoked. She has never used smokeless tobacco. She reports that she does not drink alcohol or use drugs.  ROS: UROLOGY Frequent Urination?: Yes Hard to postpone urination?: Yes Burning/pain with urination?: No Get up at night to urinate?: Yes Leakage of urine?: Yes Urine stream starts and stops?: Yes Trouble starting stream?: No Do you have to strain to urinate?: No Blood in urine?: No Urinary tract infection?:  Yes Sexually transmitted disease?: No Injury to kidneys or bladder?: No Painful intercourse?: No Weak stream?: Yes Currently pregnant?: No Vaginal bleeding?: No Last menstrual period?: 03/05/16  Gastrointestinal Nausea?: No Vomiting?: No Indigestion/heartburn?: No Diarrhea?: No Constipation?: No  Constitutional Fever: No Night sweats?: No Weight loss?: No Fatigue?: Yes  Skin Skin rash/lesions?: No Itching?: No  Eyes Blurred vision?: No Double vision?: No  Ears/Nose/Throat Sore throat?: No Sinus problems?: No  Hematologic/Lymphatic Swollen glands?: No Easy bruising?: No  Cardiovascular Leg swelling?: No Chest pain?: No  Respiratory Cough?: No Shortness of breath?: No  Endocrine Excessive thirst?: No  Musculoskeletal Back pain?: Yes Joint pain?: Yes  Neurological Headaches?: No Dizziness?: Yes  Psychologic Depression?: No Anxiety?: Yes  Physical Exam: BP 113/82   Pulse (!) 111   Ht 5' (1.524 m)   Wt 275 lb 6.4 oz (124.9 kg)   LMP 01/21/2016 Comment: no chance of preg per pt  BMI 53.79 kg/m   Constitutional: Well nourished. Alert and oriented, No acute distress. HEENT: [place] AT, moist mucus membranes. Trachea midline, no masses. Cardiovascular: No clubbing, cyanosis, or edema. Respiratory: Normal respiratory effort, no increased work of breathing. GI: Abdomen is soft, non tender, non distended, no abdominal masses. Liver and spleen not palpable.  No hernias appreciated.  Stool sample for occult testing is not indicated.   GU: No CVA tenderness.  No bladder fullness or masses.  Normal external genitalia, normal pubic hair distribution, no lesions.  Normal urethral meatus, no lesions, no prolapse, no discharge.   No urethral masses, tenderness and/or tenderness. No bladder fullness, tenderness or masses. Normal vagina mucosa, good estrogen effect, no discharge, no lesions, good pelvic support,  Grade II cystocele is noted.  No rectocele noted.   Could not perform bimanual exam due to body habitus.  Anus and perineum are without rashes or lesions.    Skin: No rashes, bruises or suspicious lesions. Lymph: No cervical or inguinal adenopathy. Neurologic: Grossly intact, no focal deficits, moving all 4 extremities. Psychiatric: Normal mood and affect.  Laboratory Data: Lab Results  Component Value Date   WBC 9.6 01/30/2016   HGB 12.6 01/30/2016   HCT 38.7 01/30/2016   MCV 80.1 01/30/2016   PLT 397 01/30/2016    Lab Results  Component Value Date   CREATININE 0.82 02/27/2016    Lab Results  Component Value Date   TSH 3.30 02/27/2016    Lab Results  Component Value Date   AST 25 10/01/2015   Lab Results  Component Value Date   ALT 32 10/01/2015    Pertinent Imaging: CLINICAL DATA:  RIGHT lower quadrant and LEFT lower quadrant pain for 3 weeks, nausea, RIGHT lower quadrant pain radiating to back and RIGHT groin, prior tubal ligation and cholecystectomy  EXAM: CT ABDOMEN AND PELVIS WITH CONTRAST  TECHNIQUE: Multidetector CT imaging  of the abdomen and pelvis was performed using the standard protocol following bolus administration of intravenous contrast. Sagittal and coronal MPR images reconstructed from axial data set.  CONTRAST:  Dilute oral contrast.  100 cc Isovue 370 IV.  COMPARISON:  10/01/2015  FINDINGS: Lower chest:  Lung bases clear  Hepatobiliary: Gallbladder surgically absent liver normal appearance.  Pancreas: Normal appearance  Spleen: Normal appearance  Adrenals/Urinary Tract: Adrenal glands normal appearance. Horseshoe kidney without mass, hydronephrosis, urinary tract calcification or ureteral dilatation. Bladder normal appearance.  Stomach/Bowel: Normal appendix. Stomach and bowel loops normal appearance  Vascular/Lymphatic: Vascular structures unremarkable. No adenopathy.  Reproductive: Normal appearing uterus. Questions tiny complex or hemorrhagic cyst within LEFT  ovary 14 mm diameter. RIGHT adnexa unremarkable.  Other: No free air or free fluid.  No hernia.  Musculoskeletal: Osteitis condensans iliac. No acute osseous findings.  IMPRESSION: Question tiny complex or hemorrhagic cyst within LEFT ovary 14 mm diameter.  Horseshoe kidney without acute abnormality.  Remainder of exam unremarkable.   Electronically Signed   By: Lavonia Dana M.D.   On: 02/14/2016 12:42  Results for MIYONA, HYMAN (MRN [id number]) as of 03/12/2016 11:09  Ref. Range 03/12/2016 09:14  Scan Result Unknown 0    Assessment & Plan:    1. Urinary hesitancy  - Bladder Scan (Post Void Residual) in office- 0 mL  - feeling is caused by cystocele  2. Stress urinary incontinence  - BMI of 53.79- advised to lose weight as studies have shown improvement with incontinence with weight loss- consult with PCP regarding dietary referral and/or referral for bariatric surgery  - grade II cystocele  - follow up in 6 weeks for recheck  3. Cystocele  - not a candidate for surgery due to high BMI- will need to obtain a BMI of 35  - discussed pessary-would like a referral  - discussed PT- would like a referral  4. Horseshoe kidney  - explained to the patient that this is not the cause of her urinary issues  - she will need to avoid sodas and increase her water intake to 2.5 L daily in an effort to avoid stone formation as the anatomy of her kidneys will make it difficult to remove a stone  5. Nocturia  - I explained to the patient that nocturia is often multi-factorial and difficult to treat.  Sleeping disorders, heart conditions and peripheral vascular disease, diabetes,  enlarged prostate or urethral stricture causing bladder outlet obstruction and/or certain medications.  - I have suggested that the patient avoid caffeine and alcohol in the evening. He may also benefit from fluid restrictions after 6:00 in the evening and voiding just prior to bedtime.  - Research  studies have showed that over 84% of patients with sleep apnea reported frequent nighttime urination.   With sleep apnea, oxygen decreases, carbon dioxide increases, the blood become more acidic, the heart rate drops and blood vessels in the lung constrict.  The body is then alerted that something is very wrong. The sleeper must wake enough to reopen the airway. By this time, the heart is racing and experiences a false signal of fluid overload. The heart excretes a hormone-like protein that tells the body to get rid of sodium and water, resulting in nocturia.  - The patient may also benefit from a discussion with his primary care physician to see if she has risk factors for sleep apnea or other sleep disturbances and obtaining a sleep study.   Return in about 6 weeks (around  04/23/2016) for follow up for cystocele.  These notes generated with voice recognition software. I apologize for typographical errors.  Zara Council, [facility] [address] [place], [place] [address] [phone]

## 2016-03-15 ENCOUNTER — Encounter: Payer: Self-pay | Admitting: Family Medicine

## 2016-03-16 ENCOUNTER — Encounter: Payer: Self-pay | Admitting: Family Medicine

## 2016-03-20 ENCOUNTER — Encounter: Payer: Self-pay | Admitting: Family Medicine

## 2016-03-20 ENCOUNTER — Encounter: Payer: Self-pay | Admitting: Obstetrics and Gynecology

## 2016-03-21 ENCOUNTER — Other Ambulatory Visit: Payer: Self-pay | Admitting: Family Medicine

## 2016-03-21 DIAGNOSIS — Z6841 Body Mass Index (BMI) 40.0 and over, adult: Principal | ICD-10-CM

## 2016-04-04 ENCOUNTER — Encounter: Payer: Self-pay | Admitting: *Deleted

## 2016-04-23 ENCOUNTER — Ambulatory Visit: Payer: Commercial Managed Care - PPO | Admitting: Urology

## 2016-04-26 ENCOUNTER — Ambulatory Visit (INDEPENDENT_AMBULATORY_CARE_PROVIDER_SITE_OTHER): Payer: Commercial Managed Care - PPO | Admitting: Obstetrics and Gynecology

## 2016-04-26 ENCOUNTER — Encounter: Payer: Self-pay | Admitting: Obstetrics and Gynecology

## 2016-04-26 VITALS — BP 130/84 | HR 98 | Resp 16 | Ht 60.0 in | Wt 277.0 lb

## 2016-04-26 DIAGNOSIS — N8111 Cystocele, midline: Secondary | ICD-10-CM | POA: Diagnosis not present

## 2016-04-26 DIAGNOSIS — N393 Stress incontinence (female) (male): Secondary | ICD-10-CM

## 2016-04-26 NOTE — Patient Instructions (Signed)
1. #2 incontinence dish is fitted today. It will be ordered. 2. Return in 1 week for pessary insertion when it arrives. You will be notified by phone.

## 2016-04-26 NOTE — Progress Notes (Signed)
Chief complaint: 1. Cystocele 2. Stress urinary incontinence   Patient presents today for possible pessary trial. She is referred from [facility] urology for management of stress incontinence and cystocele which has been ongoing since the birth of her children. She does cough with laughing sneezing lifting etc. She has nocturia 3 times per night. She reports having moderate volume of urine loss with episodes of incontinence. She does wear pads daily. Risk factors for incontinence including her obesity 3 (BMI of 54), history of multiple childbirth. Patient has noted a lessening in urine loss episodes with recent weight loss.  Past medical history, past surgical history, problem list, medications, and allergies are reviewed  OBJECTIVE: BP 130/84 (BP Location: Left Arm, Patient Position: Sitting, Cuff Size: Large)   Pulse 98   Resp 16   Ht 5' (1.524 m)   Wt 277 lb (125.6 kg)   LMP 04/11/2016 (Exact Date)   BMI 54.10 kg/m  Pleasant obese female in no acute distress. Alert and oriented. Abdomen: Large pannus: No palpable masses or tenderness Pelvic exam: External genitalia have been normal BUS-normal Vagina-good estrogen effect; first to second degree cystocele with Valsalva and rotational descent of bladder neck; no rectocele Cervix-eversion present; no cervical motion tenderness; no prolapse Uterus-midplane, normal size and shape, mobile, nontender Adnexa-nonpalpable nontender Rectovaginal-normal external exam; no rectocele  PROCEDURE: Pessary fitting  Incontinence dish #2-successful  ASSESSMENT: 1. Cystocele, first to second-degree, symptomatic with stress urinary incontinence 2. Successful pessary fitting today 3. Morbid obesity, with less stress incontinence symptoms with recent weight loss  PLAN: 1. #2 incontinence dish pessary is ordered 2. Patient will return when pessary arrives for insertion 3. Continue with ongoing weight loss  A total of 25 minutes were spent  face-to-face with the patient during this encounter and over half of that time involved counseling and coordination of care.  Brayton Mars, MD  Note: This dictation was prepared with Dragon dictation along with smaller phrase technology. Any transcriptional errors that result from this process are unintentional.

## 2016-05-02 DIAGNOSIS — E559 Vitamin D deficiency, unspecified: Secondary | ICD-10-CM | POA: Insufficient documentation

## 2016-05-21 ENCOUNTER — Ambulatory Visit: Payer: Commercial Managed Care - PPO | Admitting: Physical Therapy

## 2016-05-21 ENCOUNTER — Encounter: Payer: Self-pay | Admitting: Family Medicine

## 2016-05-28 ENCOUNTER — Encounter: Payer: Commercial Managed Care - PPO | Admitting: Physical Therapy

## 2016-05-28 ENCOUNTER — Encounter: Payer: Self-pay | Admitting: Family Medicine

## 2016-05-28 ENCOUNTER — Ambulatory Visit (INDEPENDENT_AMBULATORY_CARE_PROVIDER_SITE_OTHER): Payer: Commercial Managed Care - PPO | Admitting: Family Medicine

## 2016-05-28 VITALS — BP 130/90 | HR 78 | Temp 98.0°F | Resp 16 | Ht 60.0 in | Wt 276.0 lb

## 2016-05-28 DIAGNOSIS — E034 Atrophy of thyroid (acquired): Secondary | ICD-10-CM | POA: Diagnosis not present

## 2016-05-28 DIAGNOSIS — I10 Essential (primary) hypertension: Secondary | ICD-10-CM | POA: Diagnosis not present

## 2016-05-28 DIAGNOSIS — E559 Vitamin D deficiency, unspecified: Secondary | ICD-10-CM

## 2016-05-28 DIAGNOSIS — F418 Other specified anxiety disorders: Secondary | ICD-10-CM

## 2016-05-28 DIAGNOSIS — F329 Major depressive disorder, single episode, unspecified: Secondary | ICD-10-CM

## 2016-05-28 DIAGNOSIS — F32A Depression, unspecified: Secondary | ICD-10-CM

## 2016-05-28 DIAGNOSIS — F419 Anxiety disorder, unspecified: Principal | ICD-10-CM

## 2016-05-28 LAB — TSH: TSH: 2.62 mIU/L

## 2016-05-28 MED ORDER — VITAMIN D 50 MCG (2000 UT) PO TABS: 2000.0000 [IU] | ORAL_TABLET | Freq: Every day | ORAL | Status: DC

## 2016-05-28 MED ORDER — HYDROCHLOROTHIAZIDE 12.5 MG PO CAPS: 12.5000 mg | ORAL_CAPSULE | Freq: Every day | ORAL | 5 refills | Status: DC

## 2016-05-28 MED ORDER — VENLAFAXINE HCL ER 75 MG PO CP24: 75.0000 mg | ORAL_CAPSULE | Freq: Every day | ORAL | 3 refills | Status: DC

## 2016-05-28 NOTE — Assessment & Plan Note (Signed)
New diagnosis of HTN, most likely related to severe morbid obesity, some family history. - Persistent elevated outside BP  Plan: 1. Start low dose initial anti-HTN therapy with HCTZ 12.5mg  daily, also may help with mild swelling 2. Follow-up outside labs done by [facility] for chemistry monitoring 3. Follow-up sooner if outside BP uncontrolled, otherwise 6wk to 3 months

## 2016-05-28 NOTE — Patient Instructions (Signed)
Thank you for coming in to clinic today.  1. New BP pill 12.5mg  HCTZ daily, has fluid pill component, will help with some swelling, stay well hydrated 2. INcreased Venlafaxine 75mg  today 3. Vitamin D, will send result. If normal, start Vitamin D3 2,000 units daily for maintenance / if taking MVI add them together and aim for around 2,000 4. Write BP readings down and bring to next visit  Iron supplement once daily with food  Please schedule a follow-up appointment with Dr. Parks Ranger in 3 months Depression/Anxiety, Thyroid  If you have any other questions or concerns, please feel free to call the clinic or send a message through [place]. You may also schedule an earlier appointment if necessary.  Nobie Putnam, DO [place]

## 2016-05-28 NOTE — Assessment & Plan Note (Signed)
Re-check Vitamin D today, last 21, s/p Vit D3 50k x 4 months Advised go ahead and get Vitamin D3 2,000iu daily for maintenance therapy

## 2016-05-28 NOTE — Assessment & Plan Note (Signed)
Remains stable to improved on continued Venlafaxine 75mg  nightly now for 4 months, see improved PHQ / GAD7 - Multifactorial problems with hypothyroid, depression/anxiety, mild anemia, vit D def. Now seems most are stabilized, overall improvement, suspect ultimately will have less fatigue and feel better with wt loss and lifestyle changes  Plan: 1. Re-order Venlafaxine 75mg  nightly (DC'd x 2 of the 37.5mg  caps) 2. Follow-up 3 months

## 2016-05-28 NOTE — Assessment & Plan Note (Signed)
Previously controlled, on Levothyroxine 77mcg, suspect initial diagnosis more subclinical hypothyroidism, but did have improved wt loss back on thyroid supplement. - Last TSH 3 to 4.7 (checked by [facility] recently as well)  Plan: 1. Re-check TSH today, adjust Levothyroxine as needed, currently 68mcg daily

## 2016-05-28 NOTE — Progress Notes (Signed)
Subjective:    Patient ID: Leah Osborne, female    DOB: 04-12-1986, 30 y.o.   MRN: [id number]  Leah Osborne is a 30 y.o. female presenting on 05/28/2016 for Hypothyroidism   HPI   CHRONIC HTN, new diagnosis: Reports recent elevated BP, she does check at drug store on her own around mid 130s/80s, other doctors offices were checking BP with elevated readings 140s/90-100, today initial electronic cuff is also elevated. Current Meds - None - never on anti-HTN therapy   Lifestyle - Followed by [facility] see below, Improved low calorie diet and some exercise (limited by knee and leg pain at times) Denies CP, dyspnea, HA, edema, dizziness / lightheadedness  HYPOTYROIDISM, Follow-up - First diagnosed with hypothyroidism in 01/2016, symptoms with weight gain, depression, fatigue, reduced energy. She was started on Levothyroxine 35mcg daily at that time, did well but unsure if significant improvement. TSH has improved from >10 down to 4.69 and last checked 3.30, however outside lab [facility] with recent 4.780 (04/2016) - Gained +20 lbs previously, recently since starting medication has lost about 10 lbs - Due for TSH today, still taking Levothyroxine 43mcg daily   DEPRESSION / ANXIETY, Follow-up: - Reports her current symptoms with depression and anxiety, have much improved on Venlafaxine initially 37.5mg  daily up to 75mg  daily after 1 week, first started in 01/2016, no other significant treatments in past. Also given rx Ativan 0.5mg  PRN for panic attacks only, has taken 3 doses in past 4 months since starting in August - See PHQ, GAD scores below, improved - Admits improved insomnia, previously hard time to fall asleep, and would wake up and difficulty falling back asleep - Still admits tired, and little energy - Denies any suicidal or homicidal ideation  Vitamin D, deficiency / History of Anemia - Last Vitamin D checked 01/2016 with Vit D 23, she was treated with Vitamin D3 50,000 iu weekly  for 12+ weeks, still taking, now for 4 months - Due for vitamin D re-check today - Prior labs with Hgb 11.5 to 13, recently advised by [facility] that anemic, and should take daily iron supplement has not started this yet  Morbid Obesity BMI >53 - Followed by Bariatric Surgery / [facility] Loss, currently in process of 6 month wt management program with detailed labs, work-up, has had sleep study, endoscopy, EKG< diet and exercising program, anticipates future follow-up 09/2016 for discussing possible surgery   Social History  Substance Use Topics  . Smoking status: Never Smoker  . Smokeless tobacco: Never Used  . Alcohol use No    Review of Systems Per HPI unless specifically indicated above   GAD 7 : Generalized Anxiety Score 05/28/2016 02/27/2016 01/23/2016  Nervous, Anxious, on Edge 3 1 3   Control/stop worrying 1 1 2   Worry too much - different things 1 1 3   Trouble relaxing 2 1 3   Restless 0 1 3  Easily annoyed or irritable 3 1 3   Afraid - awful might happen 0 1 3  Total GAD 7 Score 10 7 20   Anxiety Difficulty Not difficult at all - Extremely difficult   Depression screen [facility] 2/9 05/28/2016 01/23/2016  Decreased Interest 0 2  Down, Depressed, Hopeless 0 2  PHQ - 2 Score 0 4  Altered sleeping 0 3  Tired, decreased energy 3 3  Change in appetite 0 3  Feeling bad or failure about yourself  0 2  Trouble concentrating 0 0  Moving slowly or fidgety/restless 1 0  Suicidal  thoughts 0 1  PHQ-9 Score 4 16  Difficult doing work/chores - Extremely dIfficult        Objective:    BP 130/90 (BP Location: Left Arm, Cuff Size: Large)   Pulse 78   Temp 98 F (36.7 C) (Oral)   Resp 16   Ht 5' (1.524 m)   Wt 276 lb (125.2 kg)   BMI 53.90 kg/m   Wt Readings from Last 3 Encounters:  05/28/16 276 lb (125.2 kg)  04/26/16 277 lb (125.6 kg)  03/12/16 275 lb 6.4 oz (124.9 kg)    Physical Exam  Constitutional: She appears well-developed and well-nourished. No distress.  Well-appearing,  comfortable, cooperative, obese  HENT:  Head: Normocephalic and atraumatic.  Mouth/Throat: Oropharynx is clear and moist.  Eyes: Conjunctivae are normal.  Neck: Normal range of motion. Neck supple. No thyromegaly present.  Cardiovascular: Normal rate, regular rhythm, normal heart sounds and intact distal pulses.   No murmur heard. Pulmonary/Chest: Effort normal and breath sounds normal. No respiratory distress. She has no wheezes. She has no rales.  Musculoskeletal: She exhibits no edema.  Lymphadenopathy:    She has no cervical adenopathy.  Neurological: She is alert.  Skin: Skin is warm and dry. She is not diaphoretic.  Psychiatric: She has a normal mood and affect. Her behavior is normal. Thought content normal.  Nursing note and vitals reviewed.      Assessment & Plan:   Problem List Items Addressed This Visit    Vitamin D deficiency    Re-check Vitamin D today, last 70, s/p Vit D3 50k x 4 months Advised go ahead and get Vitamin D3 2,000iu daily for maintenance therapy      Relevant Medications   Cholecalciferol (VITAMIN D) 2000 units tablet   Other Relevant Orders   VITAMIN D 25 Hydroxy (Vit-D Deficiency, Fractures)   Morbid (severe) obesity due to excess calories (HCC)    Followed by [facility] Bariatric wt management / surgery - Continue lifestyle management plan      Hypothyroidism    Previously controlled, on Levothyroxine 77mcg, suspect initial diagnosis more subclinical hypothyroidism, but did have improved wt loss back on thyroid supplement. - Last TSH 3 to 4.7 (checked by [facility] recently as well)  Plan: 1. Re-check TSH today, adjust Levothyroxine as needed, currently 50mcg daily      Relevant Orders   TSH   Hypertension    New diagnosis of HTN, most likely related to severe morbid obesity, some family history. - Persistent elevated outside BP  Plan: 1. Start low dose initial anti-HTN therapy with HCTZ 12.5mg  daily, also may help with mild swelling 2. Follow-up  outside labs done by [facility] for chemistry monitoring 3. Follow-up sooner if outside BP uncontrolled, otherwise 6wk to 3 months      Relevant Medications   hydrochlorothiazide (MICROZIDE) 12.5 MG capsule   Anxiety and depression - Primary    Remains stable to improved on continued Venlafaxine 75mg  nightly now for 4 months, see improved PHQ / GAD7 - Multifactorial problems with hypothyroid, depression/anxiety, mild anemia, vit D def. Now seems most are stabilized, overall improvement, suspect ultimately will have less fatigue and feel better with wt loss and lifestyle changes  Plan: 1. Re-order Venlafaxine 75mg  nightly (DC'd x 2 of the 37.5mg  caps) 2. Follow-up 3 months      Relevant Medications   venlafaxine XR (EFFEXOR-XR) 75 MG 24 hr capsule      Meds ordered this encounter  Medications  . hydrochlorothiazide (MICROZIDE) 12.5  MG capsule    Sig: Take 1 capsule (12.5 mg total) by mouth daily.    Dispense:  30 capsule    Refill:  5  . venlafaxine XR (EFFEXOR-XR) 75 MG 24 hr capsule    Sig: Take 1 capsule (75 mg total) by mouth at bedtime.    Dispense:  90 capsule    Refill:  3  . Cholecalciferol (VITAMIN D) 2000 units tablet    Sig: Take 1 tablet (2,000 Units total) by mouth daily.      Follow up plan: Return in about 3 months (around 08/26/2016) for anxiety, depression, thyroid.  Nobie Putnam, [facility] 05/28/2016, 4:27 PM

## 2016-05-28 NOTE — Assessment & Plan Note (Signed)
Followed by [facility] Bariatric wt management / surgery - Continue lifestyle management plan

## 2016-05-29 ENCOUNTER — Encounter: Payer: Self-pay | Admitting: Family Medicine

## 2016-05-29 LAB — VITAMIN D 25 HYDROXY (VIT D DEFICIENCY, FRACTURES): Vit D, 25-Hydroxy: 45 ng/mL (ref 30–100)

## 2016-06-04 ENCOUNTER — Encounter: Payer: Commercial Managed Care - PPO | Admitting: Physical Therapy

## 2016-06-07 ENCOUNTER — Encounter: Payer: Self-pay | Admitting: Obstetrics and Gynecology

## 2016-06-07 ENCOUNTER — Ambulatory Visit (INDEPENDENT_AMBULATORY_CARE_PROVIDER_SITE_OTHER): Payer: Commercial Managed Care - PPO | Admitting: Obstetrics and Gynecology

## 2016-06-07 VITALS — BP 137/82 | HR 81 | Wt 274.3 lb

## 2016-06-07 DIAGNOSIS — N393 Stress incontinence (female) (male): Secondary | ICD-10-CM

## 2016-06-07 DIAGNOSIS — N8111 Cystocele, midline: Secondary | ICD-10-CM

## 2016-06-07 NOTE — Patient Instructions (Signed)
1. Return in 3 months for pessary maintenance or sooner if problems develop 2. Trimosan gel intravaginally once weekly recommended

## 2016-06-07 NOTE — Progress Notes (Signed)
Chief complaint: 1. Cystocele 2. Stress incontinence  Patient presents for pessary insertion for management of stress urinary incontinence. #2 incontinence dish pessary with notch was previously fitted.  Patient demonstrated insertion and removal competence.  OBJECTIVE: BP 137/82   Pulse 81   Wt 274 lb 5 oz (124.4 kg)   LMP 05/08/2016 (Exact Date)   BMI 53.57 kg/m  Pelvic exam: External genitalia have been normal BUS-normal Vagina-good estrogen effect; first to second degree cystocele with Valsalva and rotational descent of bladder neck; no rectocele Cervix-eversion present; no cervical motion tenderness; no prolapse Uterus-midplane, normal size and shape, mobile, nontender Adnexa-nonpalpable nontender Rectovaginal-normal external exam; no rectocele  Patient demonstrated successful insertion with notch in the subpubic region.  ASSESSMENT: 1. Cystocele 2. Stress incontinence  PLAN: 1. Return in 3 months for follow-up pessary maintenance 2. Trimosan gel intravaginal once a week recommended  Brayton Mars, MD  Note: This dictation was prepared with Dragon dictation along with smaller phrase technology. Any transcriptional errors that result from this process are unintentional.

## 2016-06-13 ENCOUNTER — Encounter: Payer: Commercial Managed Care - PPO | Admitting: Physical Therapy

## 2016-06-25 ENCOUNTER — Encounter: Payer: Self-pay | Admitting: Family Medicine

## 2016-06-25 ENCOUNTER — Telehealth: Payer: Self-pay | Admitting: Family Medicine

## 2016-06-25 ENCOUNTER — Encounter: Payer: Commercial Managed Care - PPO | Admitting: Physical Therapy

## 2016-06-25 NOTE — Telephone Encounter (Signed)
Received MyChart message today 06/25/16 regarding recent worsening anxiety/depression and expressed episode of suicidal ideation on 06/21/16, see full message for details per patient. She had questions regarding Venlafaxine dosing, had been off for several days, and asked about increased dose. See my response for full plan, but briefly, advised her in mychart message to continue Venlafaxine 75mg  extended release daily for at least 4-7 days before dose increase (if she had been off of the med for 5 days), she could increase to 112.5mg  daily by taking one of the 75mg  capsulse and one of the prior 37.5mg  capsules at same time, for up to 2 weeks, then if needed could increase to 2 of the 75mg  capsules for dose of 150mg . Advised her to follow-up within 2-4 weeks on this issue adjust medications, and given her strict return precautions when to seek help immediately in ED if suicidal ideation, advised her that with acute serious symptoms mychart message is not ideal way to follow-up on this, and if recurrence needs to call or seek help immediately.  Placing documentation of telephone note at this time, 06/25/16 approx 5:24pm, I attempted to call patient to notify her that I have responded to her MyChart message and review this with her, but unable to reach her, and I left a voicemail for her to check her mychart message, call us with questions this week, and follow-up.  Nobie Putnam, DO [facility] 06/25/2016, 5:24 PM

## 2016-06-27 ENCOUNTER — Encounter: Payer: Self-pay | Admitting: Family Medicine

## 2016-06-27 NOTE — Telephone Encounter (Signed)
Can you help with obtaining these records and CT imaging from outside Urgent Care by her request?  Thank you.  Nobie Putnam, [facility] 06/27/2016, 12:29 PM

## 2016-07-09 ENCOUNTER — Encounter: Payer: Commercial Managed Care - PPO | Admitting: Physical Therapy

## 2016-07-16 ENCOUNTER — Other Ambulatory Visit: Payer: Self-pay | Admitting: Family Medicine

## 2016-07-23 ENCOUNTER — Encounter: Payer: Commercial Managed Care - PPO | Admitting: Physical Therapy

## 2016-08-01 ENCOUNTER — Encounter: Payer: Self-pay | Admitting: Family Medicine

## 2016-08-01 DIAGNOSIS — M461 Sacroiliitis, not elsewhere classified: Secondary | ICD-10-CM

## 2016-08-02 DIAGNOSIS — M461 Sacroiliitis, not elsewhere classified: Secondary | ICD-10-CM | POA: Insufficient documentation

## 2016-08-17 ENCOUNTER — Encounter: Payer: Self-pay | Admitting: Family Medicine

## 2016-09-05 ENCOUNTER — Encounter: Payer: Commercial Managed Care - PPO | Admitting: Obstetrics and Gynecology

## 2016-09-07 ENCOUNTER — Ambulatory Visit (INDEPENDENT_AMBULATORY_CARE_PROVIDER_SITE_OTHER): Payer: Commercial Managed Care - PPO | Admitting: Family Medicine

## 2016-09-07 ENCOUNTER — Encounter: Payer: Self-pay | Admitting: Family Medicine

## 2016-09-07 VITALS — BP 127/82 | HR 84 | Temp 98.3°F | Resp 16 | Ht 60.0 in | Wt 266.6 lb

## 2016-09-07 DIAGNOSIS — I10 Essential (primary) hypertension: Secondary | ICD-10-CM

## 2016-09-07 DIAGNOSIS — F419 Anxiety disorder, unspecified: Principal | ICD-10-CM

## 2016-09-07 DIAGNOSIS — E034 Atrophy of thyroid (acquired): Secondary | ICD-10-CM | POA: Diagnosis not present

## 2016-09-07 DIAGNOSIS — M461 Sacroiliitis, not elsewhere classified: Secondary | ICD-10-CM

## 2016-09-07 DIAGNOSIS — F418 Other specified anxiety disorders: Secondary | ICD-10-CM

## 2016-09-07 DIAGNOSIS — F329 Major depressive disorder, single episode, unspecified: Secondary | ICD-10-CM

## 2016-09-07 DIAGNOSIS — F32A Depression, unspecified: Secondary | ICD-10-CM

## 2016-09-07 NOTE — Assessment & Plan Note (Signed)
Recent worsening control, had been more stable, worse with significant life stressors, was off Venlafaxine 4 days and did worse. - Worse GAD - Multifactorial history, however several areas seem to be improving or at least now plan in place with various specialist at [facility], future bariatric surgery, management of rheum condition/pain, and OSA/CPAP  Plan: 1. Advised to increase Venlafaxine from 75 to 112.5mg  daily (mix one capsule 75mg  and one tablet 37.5mg ) try for up to 2 weeks if helping can increase to two capsules at 75mg  each for 150mg  dose daily - can switch to morning if prefer, may help sleep, reduce sedation - advised to message / contact office when ready for new rx Venlafaxine can increase dose as needed to 100 vs 150mg  2. May benefit from psychology counseling as well 3. Follow-up 6 months for Annual Physical - no labs ordered will await to see outside lab results through [facility]. Return sooner if needed

## 2016-09-07 NOTE — Assessment & Plan Note (Signed)
Continue follow-up with one Rheumatologist, on med management, further work-up with MRI, disease modifying agents, NSAIDs, ophthal eval - Suspect if pain controlled in addition to wt loss, her mental health conditions would notably improve

## 2016-09-07 NOTE — Assessment & Plan Note (Signed)
Significantly improved HTN control now with new diagnosis on med - Outside readings compared  Plan: 1. Continue HCTZ 12.5mg  daily 2. Follow-up outside labs, stable recently 3. Follow-up 6 months

## 2016-09-07 NOTE — Assessment & Plan Note (Signed)
Followed by [facility] Surgery/Bariatrics / Nutrition Wt loss 10 lbs in 3 months Likely etiology for OSA, now awaiting CPAP Follow-up 09/2016 with [facility] and discuss possible bariatric surgical options after completing 6 month lifestyle management

## 2016-09-07 NOTE — Assessment & Plan Note (Addendum)
Stable, controlled Last TSH 2.6 (05/2016) >  2.5 [facility] 2/018) Continue Levothyroxine 77mcg daily Check TSH q 6-12 mo

## 2016-09-07 NOTE — Progress Notes (Signed)
Subjective:    Patient ID: Leah Osborne, female    DOB: Nov 28, 1985, 31 y.o.   MRN: [id number]  Leah Osborne is a 31 y.o. female presenting on 09/07/2016 for Hypothyroidism (follow up)   HPI   Specialists: [place] MSN, FNP APRN-BC [facility] Nutrition - Renella Cunas, RD LDN [facility] Hematology - Su Grand, MD [place], MD [facility] Neurology - Jeneen Montgomery, NP [facility] Gastroenterology - Chales Abrahams, MD  CHRONIC HTN: Last visit 05/2016, considered new dx HTN based on office readings, outside readings, and other doctors offices. She was started on HCTZ 12.24m daily. - Today reports doing well, BP seems better controlled, several recent doctors visit had normal BP. Today BP is in normal range. Current Meds - HCTZ 12.548mdaily - took today Denies CP, dyspnea, HA, edema, dizziness / lightheadedness  HYPOTYROIDISM, Follow-up Last visit 05/2016 re-checked TSH with normal 2.62, she was continued on Levothyroxine 2541mdaily. Now 3 months later, however [facility] Currently tolerating current dose of med well. - No concerns today  DEPRESSION / ANXIETY, Follow-up: Last visit 05/2016, she had been stabilized and improved on Venlafaxine 22m98mily, not increase made at that time. - Interval history now with significant life stressors with losing her job, relationship problems with her filing a restraining order, and then reversed and now going to court, also her 4 ye69r old son dx with ADHD - She did run out of Venlafaxine 22mg70mly for 4 days and felt symptoms got worse. Has resumed it since and doing well, but asking about dose increase. See mychart message, patient did not end up increasing dose as discussed. - See GAD score below, worsening Admits some insomnia still but had been improved. Still feels tired and reduced energy overall, but she is optimistic about CPAP, future weight loss and  other management by Rheum - Denies any suicidal or homicidal ideation  Morbid Obesity BMI >52 - Followed by Bariatric Surgery / [facility] Weight Loss, currently in process of 6 month wt management program with detailed labs, work-up - Interval update since last visit, she has lost >10 lbs wt, sleep study has been positive for OSA, and she is awaiting home CPAP machine. [facility] of [facility]  SACROILIAC INFLAMMATION vs Ankylosing Spondylitis / Rheumatology: - Rheumatology interval history - has seen [facility] (Dr BehalAnnalee Gentabs were HLA B27 negative, elevated CRP, chronic thrombocytosis, imaging with CT showing scleroses b/l SI joints, concern for functional dx ankylosis spondylitis vs sacroiliitis, started on Nabumetone and consider Enbrel and cortisone injection in back. [facility] Gil[facility]anna Randyilar discussion on possible diagnosis AS, continue with NSAID, nabumetone, celebrex, future MRI, ophthalmology eval.   Social History  Substance Use Topics  . Smoking status: Never Smoker  . Smokeless tobacco: Never Used  . Alcohol use No    Review of Systems Per HPI unless specifically indicated above   GAD 7 : Generalized Anxiety Score 09/07/2016 05/28/2016 02/27/2016 01/23/2016  Nervous, Anxious, on Edge '3 3 1 3  ' Control/stop worrying '3 1 1 2  ' Worry too much - different things '3 1 1 3  ' Trouble relaxing '1 2 1 3  ' Restless 0 0 1 3  Easily annoyed or irritable '3 3 1 3  ' Afraid - awful might happen 3 0 1 3  Total GAD 7 Score '16 10 7 ' 20  Anxiety Difficulty Somewhat difficult Not difficult at all - Extremely difficult         Objective:    BP 127/82   Pulse 84   Temp 98.3 F (36.8 C) (Oral)   Resp 16   Ht 5' (1.524 m)   Wt 266 lb 9.6 oz (120.9 kg)   BMI 52.07 kg/m   Wt Readings from Last 3 Encounters:  09/07/16 266 lb 9.6 oz (120.9 kg)  06/07/16 274 lb 5 oz (124.4 kg)  05/28/16 276 lb  (125.2 kg)    Physical Exam  Constitutional: She is oriented to person, place, and time. She appears well-developed and well-nourished. No distress.  Well-appearing, comfortable, cooperative, obese  HENT:  Head: Normocephalic and atraumatic.  Mouth/Throat: Oropharynx is clear and moist.  Eyes: Conjunctivae are normal.  Cardiovascular: Normal rate.   No murmur heard. Pulmonary/Chest: Effort normal.  Musculoskeletal: She exhibits no edema.  Neurological: She is alert and oriented to person, place, and time.  Skin: Skin is warm and dry. No rash noted. She is not diaphoretic. No erythema.  Psychiatric: She has a normal mood and affect. Her behavior is normal.  Well groomed, good eye contact, normal speech and thoughts, not anxious appearing, some emotional lability with recent stressors but appropriate  Nursing note and vitals reviewed.      Assessment & Plan:   Problem List Items Addressed This Visit    Sacroiliac inflammation ([place])    Continue follow-up with one Rheumatologist, on med management, further work-up with MRI, disease modifying agents, NSAIDs, ophthal eval - Suspect if pain controlled in addition to wt loss, her mental health conditions would notably improve      Relevant Medications   nabumetone (RELAFEN) 500 MG tablet   Morbid (severe) obesity due to excess calories (HCC)    Followed by [facility] Surgery/Bariatrics / Nutrition Wt loss 10 lbs in 3 months Likely etiology for OSA, now awaiting CPAP Follow-up 09/2016 with [facility] and discuss possible bariatric surgical options after completing 6 month lifestyle management       Relevant Medications   phentermine (ADIPEX-P) 37.5 MG tablet   Hypothyroidism    Stable, controlled Last TSH 2.6 (05/2016) >  2.5 [facility] 2/018) Continue Levothyroxine 61mg daily Check TSH q 6-12 mo      Hypertension    Significantly improved HTN control now with new diagnosis on med - Outside readings compared  Plan: 1. Continue HCTZ 12.5342m daily 2. Follow-up outside labs, stable recently 3. Follow-up 6 months      Anxiety and depression - Primary    Recent worsening control, had been more stable, worse with significant life stressors, was off Venlafaxine 4 days and did worse. - Worse GAD - Multifactorial history, however several areas seem to be improving or at least now [facility], management of rheum condition/pain, and OSA/CPAP  Plan: 1. Advised to increase Venlafaxine from 75 to 112.42m56maily (mix one capsule 742m442md one tablet 37.42mg)54my for up to 2 weeks if helping can increase to two capsules at 742mg 81m for 150mg d32mdaily - can switch to morning if prefer, may help sleep, reduce sedation - advised to message / contact office when ready for new rx Venlafaxine can increase dose as needed to 100 vs 150mg 2.32m benefit from psychology counseling as well 3. Follow-up 6 months for Annual Physical - no labs ordered will await to [facility]  sooner if needed          Follow up plan: Return in about 6 months (around 03/10/2017) for Annual Physical, Anxiety GAD/PHQ.  No labs ordered for physical, will check outside [facility] labs at that time, determine if any others needed.  Nobie Putnam, [facility] 09/07/2016, 9:39 PM

## 2016-09-07 NOTE — Patient Instructions (Signed)
Thank you for coming in to clinic today.  1. For venlafaxine - Increase to one 75mg  capsule and one 37.5 tab nightly for now over next 2 weeks, if not improving significantly can increase further to Venlafaxine 75mg  caps x 2 per dose - Consider switching to morning if affecting sleep  2. TSH normal, no changes now, continue Levothyroxine 42mcg daily, current dose - rx is good through 01/2017  Please schedule a follow-up appointment with Dr. Parks Ranger in 6 months for Annual Physical, Anxiety GAD/PHQ  If you have any other questions or concerns, please feel free to call the clinic or send a message through [place]. You may also schedule an earlier appointment if necessary.  Nobie Putnam, DO [place]

## 2016-09-10 ENCOUNTER — Encounter: Payer: Self-pay | Admitting: Family Medicine

## 2016-09-10 DIAGNOSIS — G43009 Migraine without aura, not intractable, without status migrainosus: Secondary | ICD-10-CM

## 2016-09-11 MED ORDER — SUMATRIPTAN SUCCINATE 25 MG PO TABS: 25.0000 mg | ORAL_TABLET | Freq: Once | ORAL | 2 refills | Status: DC | PRN

## 2016-09-11 NOTE — Addendum Note (Signed)
Addended by: Olin Hauser on: 09/11/2016 02:55 PM   Modules accepted: Orders

## 2016-09-21 ENCOUNTER — Encounter: Payer: Self-pay | Admitting: Obstetrics and Gynecology

## 2016-10-16 ENCOUNTER — Encounter: Payer: Self-pay | Admitting: Family Medicine

## 2016-10-22 ENCOUNTER — Emergency Department (HOSPITAL_COMMUNITY)
Admission: EM | Admit: 2016-10-22 | Discharge: 2016-10-23 | Disposition: A | Discharge status: Other Acute Inpatient Hospital | Payer: Commercial Managed Care - PPO | Attending: Emergency Medicine | Admitting: Emergency Medicine

## 2016-10-22 ENCOUNTER — Encounter (HOSPITAL_COMMUNITY): Payer: Self-pay | Admitting: Emergency Medicine

## 2016-10-22 DIAGNOSIS — F329 Major depressive disorder, single episode, unspecified: Secondary | ICD-10-CM | POA: Insufficient documentation

## 2016-10-22 DIAGNOSIS — R45851 Suicidal ideations: Secondary | ICD-10-CM | POA: Diagnosis not present

## 2016-10-22 DIAGNOSIS — I10 Essential (primary) hypertension: Secondary | ICD-10-CM | POA: Diagnosis not present

## 2016-10-22 DIAGNOSIS — E039 Hypothyroidism, unspecified: Secondary | ICD-10-CM | POA: Diagnosis not present

## 2016-10-22 DIAGNOSIS — Z79899 Other long term (current) drug therapy: Secondary | ICD-10-CM | POA: Insufficient documentation

## 2016-10-22 LAB — COMPREHENSIVE METABOLIC PANEL
ALK PHOS: 56 U/L (ref 38–126)
ALT: 18 U/L (ref 14–54)
ANION GAP: 7 (ref 5–15)
AST: 18 U/L (ref 15–41)
Albumin: 4 g/dL (ref 3.5–5.0)
BILIRUBIN TOTAL: 0.1 mg/dL — AB (ref 0.3–1.2)
BUN: 15 mg/dL (ref 6–20)
CALCIUM: 8.9 mg/dL (ref 8.9–10.3)
CO2: 25 mmol/L (ref 22–32)
CREATININE: 0.76 mg/dL (ref 0.44–1.00)
Chloride: 106 mmol/L (ref 101–111)
GFR calc non Af Amer: >60 mL/min (ref >60)
GLUCOSE: 96 mg/dL (ref 65–99)
Potassium: 4 mmol/L (ref 3.5–5.1)
SODIUM: 138 mmol/L (ref 135–145)
TOTAL PROTEIN: 6.7 g/dL (ref 6.5–8.1)

## 2016-10-22 LAB — RAPID URINE DRUG SCREEN, HOSP PERFORMED
Amphetamines: NOT DETECTED
BARBITURATES: NOT DETECTED
Benzodiazepines: NOT DETECTED
Cocaine: NOT DETECTED
Opiates: NOT DETECTED
TETRAHYDROCANNABINOL: NOT DETECTED

## 2016-10-22 LAB — CBC
HEMATOCRIT: 42.4 % (ref 36.0–46.0)
HEMOGLOBIN: 13.7 g/dL (ref 12.0–15.0)
MCH: 26.8 pg (ref 26.0–34.0)
MCHC: 32.3 g/dL (ref 30.0–36.0)
MCV: 82.8 fL (ref 78.0–100.0)
Platelets: 421 10*3/uL — ABNORMAL HIGH (ref 150–400)
RBC: 5.12 MIL/uL — ABNORMAL HIGH (ref 3.87–5.11)
RDW: 15.5 % (ref 11.5–15.5)
WBC: 12.1 10*3/uL — ABNORMAL HIGH (ref 4.0–10.5)

## 2016-10-22 LAB — PREGNANCY, URINE: PREG TEST UR: NEGATIVE

## 2016-10-22 LAB — SALICYLATE LEVEL

## 2016-10-22 LAB — ACETAMINOPHEN LEVEL: Acetaminophen (Tylenol), Serum: <10 ug/mL — ABNORMAL LOW (ref 10–30)

## 2016-10-22 LAB — ETHANOL: Alcohol, Ethyl (B): <5 mg/dL (ref <5)

## 2016-10-22 MED ORDER — EQ CONTACT LENS CLEANER SOLN: 5.0000 mL | Freq: Every day | Status: DC

## 2016-10-22 MED ORDER — ONDANSETRON HCL 4 MG PO TABS: 4.0000 mg | ORAL_TABLET | Freq: Three times a day (TID) | ORAL | Status: DC | PRN

## 2016-10-22 MED ORDER — ARIPIPRAZOLE 10 MG PO TABS
10.0000 mg | ORAL_TABLET | Freq: Every day | ORAL | Status: DC
  Administered 2016-10-22 – 2016-10-23 (×2): 10 mg via ORAL
  Filled 2016-10-22 (×2): qty 1

## 2016-10-22 MED ORDER — IBUPROFEN 400 MG PO TABS: 600.0000 mg | ORAL_TABLET | Freq: Three times a day (TID) | ORAL | Status: DC | PRN

## 2016-10-22 NOTE — ED Notes (Signed)
Bh counselor at bedside for assessment.

## 2016-10-22 NOTE — ED Notes (Addendum)
Sitter at bedside with patient. Dinner tray ordered. Pt calm, pleasant and cooperative.

## 2016-10-22 NOTE — ED Provider Notes (Signed)
[facility] DEPT Provider Note   CSN: [id number] Arrival date & time: 10/22/16  1352     History   Chief Complaint Chief Complaint  Patient presents with  . Suicidal    HPI Leah Osborne is a 31 y.o. female who presents with suicidal ideation. Past medical history significant for hypertension, history of migraine headaches, hypothyroidism, depression and anxiety. She states that she has multiple personal stressors which has worsened her depression recently. She states the "straw that broke the camel's back" was her sister death a month ago. She was seen by her therapist in [place] today and had her medications adjusted however when asked if she had any suicidal ideation she reports she has a plan to drive into a pole or drive off a bridge into water. She could not contract for safety therefore she was sent to the ED. Does not denies any physical complaints,. Denies headache, shortness of breath, abdominal pain. She reports intermittent chest pain however attributes this to anxiety. Feels like a tightness. She is currently chest pain-free.  HPI  Past Medical History:  Diagnosis Date  . Abnormal Pap smear of cervix   . Headache    MIGRAINES  . Hypertension     Patient Active Problem List   Diagnosis Date Noted  . 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 Hypertension 05/28/2016  . Vitamin D deficiency 05/02/2016  . 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 Hypothyroidism 02/13/2016  . Anxiety and depression 01/23/2016  . Status post tubal ligation 09/22/2015  . Morbid (severe) obesity due to excess calories ([place]) 04/25/2015  . Anemia, iron deficiency 01/04/2015  . Headache, migraine 01/04/2015  . Abnormal cytological findings in female genital organs 07/04/2011    Past Surgical History:  Procedure Laterality Date  . CHOLECYSTECTOMY    . LAPAROSCOPIC TUBAL LIGATION Bilateral 09/19/2015   Procedure: LAPAROSCOPIC BILATERAL TUBAL  BANDING;  Surgeon: Brayton Mars, MD;  Location: [facility] ORS;  Service: Gynecology;  Laterality: Bilateral;    OB History    Gravida Para Term Preterm AB Living   2 2 2  0 0 2   SAB TAB Ectopic Multiple Live Births   0 0 0 0 2       Home Medications    Prior to Admission medications   Medication Sig Start Date End Date Taking? Authorizing Provider  Cholecalciferol (VITAMIN D) 2000 units tablet Take 1 tablet (2,000 Units total) by mouth daily. 05/28/16   Karamalegos, Devonne Doughty, DO  Cyanocobalamin 500 MCG/0.1ML SOLN Place into the nose. 09/05/16   [provider]  hydrochlorothiazide (MICROZIDE) 12.5 MG capsule Take 1 capsule (12.5 mg total) by mouth daily. 05/28/16   Karamalegos, Devonne Doughty, DO  levothyroxine (SYNTHROID, LEVOTHROID) 25 MCG tablet Take 1 tablet (25 mcg total) by mouth daily before breakfast. 01/24/16   Krebs, Amy Lauren, NP  LORazepam (ATIVAN) 0.5 MG tablet Take 0.25 mg by mouth. 01/23/16   [provider]  nabumetone (RELAFEN) 500 MG tablet Take by mouth. 08/29/16 08/29/17  [provider]  phentermine (ADIPEX-P) 37.5 MG tablet Take 37.5 mg by mouth. 08/01/16   [provider]  Phentermine HCl 8 MG TABS Take by mouth. 06/01/16   [provider]  SUMAtriptan (IMITREX) 25 MG tablet Take 1-2 tablets (25-50 mg total) by mouth once as needed for migraine. May repeat one dose in 2 hours if headache persists, for max 24 hr 09/11/16   Karamalegos, Devonne Doughty, DO  Venlafaxine HCl 37.5 MG TB24  TAKE 1 TABLET BY MOUTH AT BEDTIME FOR 1 WEEK AND THEN INCREASE TO 2 TABLETS BY MOUTH DAILY. 05/15/16   [provider]  venlafaxine XR (EFFEXOR-XR) 75 MG 24 hr capsule Take 1 capsule (75 mg total) by mouth at bedtime. 05/28/16   Olin Hauser, DO    Family History Family History  Problem Relation Age of Onset  . Diabetes Maternal Grandmother   . Heart disease Maternal Grandmother   . Cancer Neg Hx     Social  History Social History  Substance Use Topics  . Smoking status: Never Smoker  . Smokeless tobacco: Never Used  . Alcohol use No     Allergies   Patient has no known allergies.   Review of Systems Review of Systems  Constitutional: Negative for fever.  Respiratory: Negative for shortness of breath.   Cardiovascular: Positive for chest pain.  Gastrointestinal: Negative for abdominal pain.  Neurological: Negative for headaches.  Psychiatric/Behavioral: Positive for dysphoric mood and suicidal ideas. Negative for hallucinations and self-injury. The patient is nervous/anxious.   All other systems reviewed and are negative.    Physical Exam Updated Vital Signs BP (!) 142/95 (BP Location: Right Arm)   Pulse (!) 108   Temp 98.9 F (37.2 C) (Oral)   Resp 18   SpO2 100%   Physical Exam  Constitutional: She is oriented to person, place, and time. She appears well-developed and well-nourished. No distress.  Obese, calm, cooperative  HENT:  Head: Normocephalic and atraumatic.  Eyes: Conjunctivae are normal. Pupils are equal, round, and reactive to light. Right eye exhibits no discharge. Left eye exhibits no discharge. No scleral icterus.  Neck: Normal range of motion.  Cardiovascular: Normal rate and regular rhythm.  Exam reveals no gallop and no friction rub.   No murmur heard. Pulmonary/Chest: Effort normal and breath sounds normal. No respiratory distress. She has no wheezes. She has no rales. She exhibits no tenderness.  Abdominal: Soft. Bowel sounds are normal. She exhibits no distension and no mass. There is no tenderness. There is no rebound and no guarding. No hernia.  Neurological: She is alert and oriented to person, place, and time.  Skin: Skin is warm and dry.  Psychiatric: Her speech is normal and behavior is normal. Judgment normal. Her affect is blunt. Cognition and memory are normal. She exhibits a depressed mood. She expresses suicidal ideation. She expresses no  homicidal ideation. She expresses suicidal plans. She expresses no homicidal plans.  Nursing note and vitals reviewed.    ED Treatments / Results  Labs (all labs ordered are listed, but only abnormal results are displayed) Labs Reviewed  COMPREHENSIVE METABOLIC PANEL - Abnormal; Notable for the following:       Result Value   Total Bilirubin 0.1 (*)    All other components within normal limits  ACETAMINOPHEN LEVEL - Abnormal; Notable for the following:    Acetaminophen (Tylenol), Serum <10 (*)    All other components within normal limits  CBC - Abnormal; Notable for the following:    WBC 12.1 (*)    RBC 5.12 (*)    Platelets 421 (*)    All other components within normal limits  ETHANOL  SALICYLATE LEVEL  RAPID URINE DRUG SCREEN, HOSP PERFORMED  PREGNANCY, URINE    EKG  EKG Interpretation None       Radiology No results found.  Procedures Procedures (including critical care time)  Medications Ordered in ED Medications  ondansetron (ZOFRAN) tablet 4 mg (not administered)  ibuprofen (ADVIL,MOTRIN) tablet 600 mg (not administered)  ARIPiprazole (ABILIFY) tablet 10 mg (10 mg Oral Given 10/22/16 2300)     Initial Impression / Assessment and Plan / ED Course  I have reviewed the triage vital signs and the nursing notes.  Pertinent labs & imaging results that were available during my care of the patient were reviewed by me and considered in my medical decision making (see chart for details).  31 year old with suicidal ideation. Vital signs are normal. EKG is normal sinus rhythm. CBC remarkable for mild leukocytosis. All other labs are unremarkable. Pregnancy test is negative. UDS is negative. Patient is medically cleared for TTS consult.  Final Clinical Impressions(s) / ED Diagnoses   Final diagnoses:  Suicidal ideation    New Prescriptions New Prescriptions   No medications on file     Iris Pert 10/22/16 2349    Charlesetta Shanks, MD 10/26/16  [phone]

## 2016-10-22 NOTE — ED Triage Notes (Signed)
Pt to ER voluntarily for SI with plan to drive into a pole or off a bridge. Pt reports sister in law died last month and since has had these thoughts. Denies substance abuse/alcohol abuse. A/o x4. Calm and cooperative.

## 2016-10-22 NOTE — ED Notes (Signed)
Pt diagnosis from therapist include - bipolar II, social phobia (generalized) and acute stress reaction.

## 2016-10-22 NOTE — ED Notes (Signed)
Pt signed Medical Clearance Policy Form, verbalized understanding, copy given to pt at bedside

## 2016-10-22 NOTE — ED Notes (Signed)
Pt placed into hospital bed and pt requesting her nightly abilify and contact solution. Claiborne Billings, PA aware, awating orders. Pt also requesting a shower, EVS being contacted to clean shower room for pt to take a shower.

## 2016-10-22 NOTE — ED Notes (Signed)
Pt ate 75% of dinner.

## 2016-10-22 NOTE — ED Notes (Signed)
Pt changed into burgundy scrubs and security being notified to come wand patient. Per patient, okay to give all belongings to friend accompanying her.

## 2016-10-22 NOTE — ED Notes (Signed)
Pt ambulates to bathrioom with steady gaitr.
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
## 2016-10-23 ENCOUNTER — Inpatient Hospital Stay
Admission: EM | Admit: 2016-10-23 | Discharge: 2016-10-24 | DRG: 885 | Disposition: A | Discharge status: Home / Self Care | Payer: Commercial Managed Care - PPO | Source: Intra-hospital | Attending: Psychiatry | Admitting: Psychiatry

## 2016-10-23 ENCOUNTER — Encounter: Payer: Self-pay | Admitting: Psychiatry

## 2016-10-23 DIAGNOSIS — F4310 Post-traumatic stress disorder, unspecified: Secondary | ICD-10-CM | POA: Diagnosis present

## 2016-10-23 DIAGNOSIS — R45851 Suicidal ideations: Secondary | ICD-10-CM | POA: Diagnosis present

## 2016-10-23 DIAGNOSIS — Z791 Long term (current) use of non-steroidal anti-inflammatories (NSAID): Secondary | ICD-10-CM | POA: Diagnosis not present

## 2016-10-23 DIAGNOSIS — Z9049 Acquired absence of other specified parts of digestive tract: Secondary | ICD-10-CM

## 2016-10-23 DIAGNOSIS — Z79899 Other long term (current) drug therapy: Secondary | ICD-10-CM | POA: Diagnosis not present

## 2016-10-23 DIAGNOSIS — Z6841 Body Mass Index (BMI) 40.0 and over, adult: Secondary | ICD-10-CM

## 2016-10-23 DIAGNOSIS — Q631 Lobulated, fused and horseshoe kidney: Secondary | ICD-10-CM | POA: Diagnosis not present

## 2016-10-23 DIAGNOSIS — M8538 Osteitis condensans, other site: Secondary | ICD-10-CM | POA: Diagnosis present

## 2016-10-23 DIAGNOSIS — E039 Hypothyroidism, unspecified: Secondary | ICD-10-CM | POA: Diagnosis present

## 2016-10-23 DIAGNOSIS — G4700 Insomnia, unspecified: Secondary | ICD-10-CM | POA: Diagnosis present

## 2016-10-23 DIAGNOSIS — Z79890 Hormone replacement therapy (postmenopausal): Secondary | ICD-10-CM | POA: Diagnosis not present

## 2016-10-23 DIAGNOSIS — F314 Bipolar disorder, current episode depressed, severe, without psychotic features: Principal | ICD-10-CM | POA: Diagnosis present

## 2016-10-23 DIAGNOSIS — E8881 Metabolic syndrome: Secondary | ICD-10-CM | POA: Diagnosis present

## 2016-10-23 DIAGNOSIS — F329 Major depressive disorder, single episode, unspecified: Secondary | ICD-10-CM | POA: Diagnosis not present

## 2016-10-23 DIAGNOSIS — F410 Panic disorder [episodic paroxysmal anxiety] without agoraphobia: Secondary | ICD-10-CM | POA: Diagnosis present

## 2016-10-23 DIAGNOSIS — Z8249 Family history of ischemic heart disease and other diseases of the circulatory system: Secondary | ICD-10-CM

## 2016-10-23 DIAGNOSIS — G43909 Migraine, unspecified, not intractable, without status migrainosus: Secondary | ICD-10-CM | POA: Diagnosis present

## 2016-10-23 DIAGNOSIS — E538 Deficiency of other specified B group vitamins: Secondary | ICD-10-CM | POA: Diagnosis present

## 2016-10-23 DIAGNOSIS — Z9851 Tubal ligation status: Secondary | ICD-10-CM | POA: Diagnosis not present

## 2016-10-23 DIAGNOSIS — Z818 Family history of other mental and behavioral disorders: Secondary | ICD-10-CM | POA: Diagnosis not present

## 2016-10-23 DIAGNOSIS — I10 Essential (primary) hypertension: Secondary | ICD-10-CM | POA: Diagnosis present

## 2016-10-23 DIAGNOSIS — F419 Anxiety disorder, unspecified: Secondary | ICD-10-CM | POA: Diagnosis present

## 2016-10-23 DIAGNOSIS — E559 Vitamin D deficiency, unspecified: Secondary | ICD-10-CM | POA: Diagnosis present

## 2016-10-23 DIAGNOSIS — M459 Ankylosing spondylitis of unspecified sites in spine: Secondary | ICD-10-CM | POA: Diagnosis present

## 2016-10-23 DIAGNOSIS — Z833 Family history of diabetes mellitus: Secondary | ICD-10-CM

## 2016-10-23 MED ORDER — HYDROCHLOROTHIAZIDE 12.5 MG PO CAPS
12.5000 mg | ORAL_CAPSULE | Freq: Every day | ORAL | Status: DC
  Administered 2016-10-23: 12.5 mg via ORAL
  Filled 2016-10-23: qty 1

## 2016-10-23 MED ORDER — ARIPIPRAZOLE 10 MG PO TABS
10.0000 mg | ORAL_TABLET | Freq: Every day | ORAL | Status: DC
  Administered 2016-10-23 – 2016-10-24 (×2): 10 mg via ORAL
  Filled 2016-10-23 (×3): qty 1

## 2016-10-23 MED ORDER — MAGNESIUM HYDROXIDE 400 MG/5ML PO SUSP: 30.0000 mL | Freq: Every day | ORAL | Status: DC | PRN

## 2016-10-23 MED ORDER — SUMATRIPTAN SUCCINATE 25 MG PO TABS: 25.0000 mg | ORAL_TABLET | Freq: Once | ORAL | Status: DC | PRN

## 2016-10-23 MED ORDER — LEVOTHYROXINE SODIUM 25 MCG PO TABS: 25.0000 ug | ORAL_TABLET | Freq: Every day | ORAL | Status: DC

## 2016-10-23 MED ORDER — HYDROCHLOROTHIAZIDE 25 MG PO TABS
25.0000 mg | ORAL_TABLET | Freq: Every day | ORAL | Status: DC
  Administered 2016-10-24: 25 mg via ORAL
  Filled 2016-10-23 (×2): qty 1

## 2016-10-23 MED ORDER — ACETAMINOPHEN 325 MG PO TABS: 650.0000 mg | ORAL_TABLET | Freq: Four times a day (QID) | ORAL | Status: DC | PRN

## 2016-10-23 MED ORDER — VENLAFAXINE HCL ER 75 MG PO CP24: 75.0000 mg | ORAL_CAPSULE | Freq: Every day | ORAL | Status: DC

## 2016-10-23 MED ORDER — PRAZOSIN HCL 1 MG PO CAPS
1.0000 mg | ORAL_CAPSULE | Freq: Two times a day (BID) | ORAL | Status: DC
  Administered 2016-10-23 – 2016-10-24 (×2): 1 mg via ORAL
  Filled 2016-10-23 (×3): qty 1

## 2016-10-23 MED ORDER — ALUM & MAG HYDROXIDE-SIMETH 200-200-20 MG/5ML PO SUSP: 30.0000 mL | ORAL | Status: DC | PRN

## 2016-10-23 MED ORDER — TRAZODONE HCL 100 MG PO TABS
100.0000 mg | ORAL_TABLET | Freq: Every day | ORAL | Status: DC
  Administered 2016-10-23: 100 mg via ORAL
  Filled 2016-10-23: qty 1

## 2016-10-23 MED ORDER — NABUMETONE 500 MG PO TABS
500.0000 mg | ORAL_TABLET | Freq: Two times a day (BID) | ORAL | Status: DC
  Administered 2016-10-23 – 2016-10-24 (×2): 500 mg via ORAL
  Filled 2016-10-23 (×3): qty 1

## 2016-10-23 MED ORDER — LEVOTHYROXINE SODIUM 25 MCG PO TABS
25.0000 ug | ORAL_TABLET | Freq: Every day | ORAL | Status: DC
  Administered 2016-10-24: 25 ug via ORAL
  Filled 2016-10-23 (×2): qty 1

## 2016-10-23 NOTE — Progress Notes (Signed)
RN notified of acceptance to [facility].  Lind Covert, MSW, [place] Disposition [phone]

## 2016-10-23 NOTE — ED Notes (Signed)
Patient was given a snack and drink, and a regular diet was ordered for lunch. 
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
## 2016-10-23 NOTE — BH Assessment (Addendum)
Patient is to be admitted to [facility] by Dr. Dwyane Dee.  Attending Physician will be Dr. Bary Leriche.   Patient has been assigned to room 306A, by [facility].   Patient Registration Mateo Flow) has been informed of pt acceptance.  Call report to [phone] Support paperwork requested to be faxed to [phone] [facility], TTS informed of pt acceptance and states she will inform pt RN.

## 2016-10-23 NOTE — Tx Team (Signed)
Initial Treatment Plan 10/23/2016 2:30 PM JAPJI KOK [id number]    PATIENT STRESSORS: Health problems Traumatic event found sister in law dead   PATIENT STRENGTHS: Ability for insight Active sense of humor Capable of independent living Communication skills Supportive family/friends   PATIENT IDENTIFIED PROBLEMS: Depression  10/23/16  Suicidal                   DISCHARGE CRITERIA:  Ability to meet basic life and health needs Improved stabilization in mood, thinking, and/or behavior Medical problems require only outpatient monitoring  PRELIMINARY DISCHARGE PLAN: Outpatient therapy Return to previous living arrangement  PATIENT/FAMILY INVOLVEMENT: This treatment plan has been presented to and reviewed with the patient, Leah Osborne, and/or family member,  .  The patient and family have been given the opportunity to ask questions and make suggestions.  Leodis Liverpool, RN 10/23/2016, 2:30 PM

## 2016-10-23 NOTE — BH Assessment (Signed)
Pt MRN provided to charge RN Meredith Mody). Pt chart currently under review for possible [facility] placement.

## 2016-10-23 NOTE — Plan of Care (Signed)
Problem: Education: Goal: Knowledge of [facility] Education information/materials will improve Outcome: Progressing Patient verbalize understanding  Of information received

## 2016-10-23 NOTE — BHH Suicide Risk Assessment (Signed)
[place] INPATIENT:  Family/Significant Other Suicide Prevention Education  Suicide Prevention Education:  Education Completed; mother, Dorcas Carrow ph#: [phone] has been identified by the patient as the family member/significant other with whom the patient will be residing, and identified as the person(s) who will aid the patient in the event of a mental health crisis (suicidal ideations/suicide attempt).  With written consent from the patient, the family member/significant other has been provided the following suicide prevention education, prior to the and/or following the discharge of the patient.  The suicide prevention education provided includes the following:  Suicide risk factors  Suicide prevention and interventions  National Suicide Hotline telephone number  [facility] assessment telephone number  [facility] Emergency Assistance [place] and/or Residential Mobile Crisis Unit telephone number  Request made of family/significant other to:  Remove weapons (e.g., guns, rifles, knives), all items previously/currently identified as safety concern.    Remove drugs/medications (over-the-counter, prescriptions, illicit drugs), all items previously/currently identified as a safety concern.  The family member/significant other verbalizes understanding of the suicide prevention education information provided.  The family member/significant other agrees to remove the items of safety concern listed above.  Emilie Rutter, MSW, LCSW-A 10/23/2016, 3:04 PM

## 2016-10-23 NOTE — ED Notes (Signed)
Pt to shower with sitter

## 2016-10-23 NOTE — ED Notes (Signed)
Pt waiting for [facility] transport

## 2016-10-23 NOTE — Progress Notes (Signed)
Admission Note:Received  From Delia Heady   D :31 yr old  Female in under the service of DR.  Pucilowska . Patient presents with Depression and suicidally . Having trouble manage  Intrusive thoughts   Having difficulties sleeping   Stressors include  Sister in law death  10/23/16, family stressors See's a therapist Patient could not contract for safety Place in front of nursing station  Until able to feel better about being here . Has history of hypertension  A: Instructed  On  Unit programing  Patient  searched and no contraband found,  Skin intact  R:Questions voiced unit policies explained and understanding verbalized.

## 2016-10-23 NOTE — [facility] Suicide Risk Assessment (Signed)
Leah Osborne Admission Suicide Risk Assessment   Nursing information obtained from:    Demographic factors:    Current Mental Status:    Loss Factors:    Historical Factors:    Risk Reduction Factors:     Total Time spent with patient: 1 hour Principal Problem: Severe bipolar I disorder, most recent episode depressed without psychotic features [facility]) Diagnosis:   Patient Active Problem List   Diagnosis Date Noted  . Severe bipolar I disorder, most recent episode depressed without psychotic features ([place]) [F31.4] 10/23/2016  . Ankylosing spondylitis ([place]) [M45.9] 10/23/2016  . Sacroiliac inflammation ([place]) [M46.1] 08/02/2016  . Hypertension [I10] 05/28/2016  . Vitamin D deficiency [E55.9] 05/02/2016  . 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 Hypothyroidism [E03.9] 02/13/2016  . Anxiety and depression [F41.9, F32.9] 01/23/2016  . Status post tubal ligation [Z98.51] 09/22/2015  . Morbid (severe) obesity due to excess calories ([place]) [E66.01] 04/25/2015  . Anemia, iron deficiency [D50.9] 01/04/2015  . Headache, migraine [G43.909] 01/04/2015  . Abnormal cytological findings in female genital organs [R87.69] 07/04/2011   Subjective Data: suicidal ideation.  Continued Clinical Symptoms:    The "Alcohol Use Disorders Identification Test", Guidelines for Use in Primary Care, Second Edition.  World Pharmacologist [facility]). Score between 0-7:  no or low risk or alcohol related problems. Score between 8-15:  moderate risk of alcohol related problems. Score between 16-19:  high risk of alcohol related problems. Score 20 or above:  warrants further diagnostic evaluation for alcohol dependence and treatment.   CLINICAL FACTORS:   Bipolar Disorder:   Depressive phase Depression:   Insomnia Severe   Musculoskeletal: Strength & Muscle Tone: within normal limits Gait & Station: normal Patient leans: N/A  Psychiatric Specialty Exam: Physical Exam   Nursing note and vitals reviewed. Psychiatric: Her speech is normal and behavior is normal. Her mood appears anxious. Cognition and memory are normal. She expresses impulsivity. She exhibits a depressed mood. She expresses suicidal ideation. She expresses suicidal plans.    Review of Systems  Musculoskeletal: Positive for back pain.  Psychiatric/Behavioral: Positive for depression and suicidal ideas. The patient is nervous/anxious and has insomnia.   All other systems reviewed and are negative.   Blood pressure (!) 151/97, pulse 93, temperature 97.9 F (36.6 C), temperature source Oral, resp. rate 18, height 5\' 1"  (1.549 m), weight 122 kg (269 lb), SpO2 99 %.Body mass index is 50.83 kg/m.  General Appearance: Casual  Eye Contact:  Good  Speech:  Clear and Coherent  Volume:  Normal  Mood:  Anxious and Depressed  Affect:  Blunt  Thought Process:  Goal Directed and Descriptions of Associations: Intact  Orientation:  Full (Time, Place, and Person)  Thought Content:  WDL  Suicidal Thoughts:  Yes.  with intent/plan  Homicidal Thoughts:  No  Memory:  Immediate;   Fair Recent;   Fair Remote;   Fair  Judgement:  Impaired  Insight:  Shallow  Psychomotor Activity:  Decreased  Concentration:  Concentration: Fair and Attention Span: Fair  Recall:  [employer] of Knowledge:  Fair  Language:  Fair  Akathisia:  No  Handed:  Right  AIMS (if indicated):     Assets:  Communication Skills Desire for Improvement Financial Resources/Insurance Housing Intimacy Physical Health Resilience Social Support  ADL's:  Intact  Cognition:  WNL  Sleep:         COGNITIVE FEATURES THAT CONTRIBUTE TO RISK:  None    SUICIDE RISK:  Severe:  Frequent, intense, and enduring suicidal ideation, specific plan, no subjective intent, but some objective markers of intent (i.e., choice of lethal method), the method is accessible, some limited preparatory behavior, evidence of impaired self-control, severe  dysphoria/symptomatology, multiple risk factors present, and few if any protective factors, particularly a lack of social support.  PLAN OF CARE: Hospital admission, medication management, discharge planning.  Leah Osborne is a 31 year old female with history of depression and anxiety admitted for suicidal ideation with a plan in the context of major loss and severe social stressors.  1. Suicidal ideation. The patient is unable to contract for safety in the hospital. Close observation.  2. Mood. We continued Abilify 10 mg for mood stabilization we will continue taper of Effexor as planned by doctor a her primary psychiatrist.  3. Hypertension. She is on hydrochlorothiazide.  4. Hypothyroidism. She is on Synthroid.  5. PTSD. We will start Minipress 1 mg twice daily for nightmares and flashbacks.  6. Ankylosing spondylitis. We continue Relafen. The patient should be taking Humira. She missed the dose on Sunday on purpose. She decided to stop Humira treatment. She did not discuss that with her rheumatologist.  7. Metabolic syndrome monitoring. Lipid panel, TSH, and hemoglobin A1c are pending.  8. EKG. Pending.  9. Vitamin D deficiency. She will continue supplementation.  10. Vitamin B12 deficiency. She will continue B12.  11. Insomnia. Trazodone is available.  12. Disposition. She will be discharged to home with her husband. She will follow-up with [facility].  I certify that inpatient services furnished can reasonably be expected to improve the patient's condition.   Orson Slick, MD 10/23/2016, 2:51 PM

## 2016-10-23 NOTE — Progress Notes (Signed)
Recreation Therapy Notes  INPATIENT RECREATION THERAPY ASSESSMENT  Patient Details Name: JOELEE SNOKE MRN: [id number] DOB: 1985/10/25 Today's Date: 11/09/2016  Patient Stressors: Death, Work, Other (Comment) (Sister-in-law died on 10/11/22 - pt found her and gave her CPR; no job; finances; 3 children under the age of 26)  Coping Skills:   Isolate, Arguments, Avoidance, Art/Dance, Music, Sports  Personal Challenges: Anger, Expressing Yourself, Self-Esteem/Confidence, Social Interaction, Stress Management  Leisure Interests (2+):  Individual - Other (Comment) (Research, scrolling the internet)  Awareness of Community Resources:  Yes  Community Resources:  [place]  Current Use: No  If no, Barriers?: Other (Comment) (No need to go)  Patient Strengths:  Eyes, lips  Patient Identified Areas of Improvement:  Self-esteem, coping skills  Current Recreation Participation:  Walk the dog  Patient Goal for Hospitalization:  To be able to get to a certain point where she can manage situations herself instead of losing control  [place] of Residence:  [place] of Residence:  [place]   Current SI (including self-harm):  No  Current HI:  No  Consent to Intern Participation: N/A   Leonette Monarch, LRT/CTRS Nov 09, 2016, 4:28 PM

## 2016-10-23 NOTE — Progress Notes (Signed)
Recreation Therapy Notes  Date: 05.08.18 Time: 3:00 pm Location: Craft Room  Group Topic: Self-expression  Goal Area(s) Addresses:  Patient will be able to identify a color that represents each emotion. Patient will verbalize benefit of using art as a means of self-expression. Patient will verbalize one emotion experienced while participating in activity.  Behavioral Response: Did not attend  Intervention: The Colors Within Me  Activity: Patients were given a blank face worksheet and were instructed to pick a color for each emotion they felt and show on the worksheet how much of that emotion they felt.  Education: LRT educated patients on other forms of self-expression  Education Outcome: Patient did not attend group.   Clinical Observations/Feedback: Patient did not attend group.  Leonette Monarch, LRT/CTRS 10/23/2016 3:35 PM

## 2016-10-23 NOTE — ED Notes (Signed)
Belongings of bra in utility room bins due to no cabinet in room

## 2016-10-24 DIAGNOSIS — F4310 Post-traumatic stress disorder, unspecified: Secondary | ICD-10-CM | POA: Diagnosis present

## 2016-10-24 LAB — LIPID PANEL
CHOL/HDL RATIO: 4.7 ratio
Cholesterol: 164 mg/dL (ref 0–200)
HDL: 35 mg/dL — AB (ref >40)
LDL CALC: 106 mg/dL — AB (ref 0–99)
Triglycerides: 117 mg/dL (ref <150)
VLDL: 23 mg/dL (ref 0–40)

## 2016-10-24 LAB — TSH: TSH: 5.234 u[IU]/mL — AB (ref 0.350–4.500)

## 2016-10-24 MED ORDER — HYDROCHLOROTHIAZIDE 25 MG PO TABS: 25.0000 mg | ORAL_TABLET | Freq: Every day | ORAL | 1 refills | Status: DC

## 2016-10-24 MED ORDER — PRAZOSIN HCL 1 MG PO CAPS: 1.0000 mg | ORAL_CAPSULE | Freq: Two times a day (BID) | ORAL | 1 refills | Status: DC

## 2016-10-24 NOTE — Tx Team (Signed)
Interdisciplinary Treatment and Diagnostic Plan Update  10/24/2016 Time of Session: 10:30 AM Leah Osborne MRN: [id number]  Principal Diagnosis: Severe bipolar I disorder, most recent episode depressed without psychotic features ([place])  Secondary Diagnoses: Principal Problem:   Severe bipolar I disorder, most recent episode depressed without psychotic features ([place]) Active Problems:   Morbid (severe) obesity due to excess calories (H[facility])   Hypothyroidism   Hypertension   Vitamin D deficiency   Ankylosing spondylitis ([facility])   Current Medications:  Current Facility-Administered Medications  Medication Dose Route Frequency Provider Last Rate Last Dose  . acetaminophen (TYLENOL) tablet 650 mg  650 mg Oral Q6H PRN Pucilowska, Jolanta B, MD      . alum & mag hydroxide-simeth (MAALOX/MYLANTA) 200-200-20 MG/5ML suspension 30 mL  30 mL Oral Q4H PRN Pucilowska, Jolanta B, MD      . ARIPiprazole (ABILIFY) tablet 10 mg  10 mg Oral Daily Pucilowska, Jolanta B, MD   10 mg at 10/24/16 0758  . hydrochlorothiazide (HYDRODIURIL) tablet 25 mg  25 mg Oral Daily Pucilowska, Jolanta B, MD   25 mg at 10/24/16 0758  . levothyroxine (SYNTHROID, LEVOTHROID) tablet 25 mcg  25 mcg Oral QAC breakfast Pucilowska, Jolanta B, MD   25 mcg at 10/24/16 0726  . magnesium hydroxide (MILK OF MAGNESIA) suspension 30 mL  30 mL Oral Daily PRN Pucilowska, Jolanta B, MD      . nabumetone (RELAFEN) tablet 500 mg  500 mg Oral BID Pucilowska, Jolanta B, MD   500 mg at 10/24/16 0802  . prazosin (MINIPRESS) capsule 1 mg  1 mg Oral BID Pucilowska, Jolanta B, MD   1 mg at 10/24/16 0758  . traZODone (DESYREL) tablet 100 mg  100 mg Oral QHS Pucilowska, Jolanta B, MD   100 mg at 10/23/16 2203  . venlafaxine XR (EFFEXOR-XR) 24 hr capsule 75 mg  75 mg Oral Q breakfast Pucilowska, Jolanta B, MD       PTA Medications: Prescriptions Prior to Admission  Medication Sig Dispense Refill Last Dose  . Adalimumab (HUMIRA) 20 MG/0.4ML PSKT  Inject 20 mg into the skin every 14 (fourteen) days.   09/30/2016 at am  . ARIPiprazole (ABILIFY) 10 MG tablet Take 10 mg by mouth daily.   10/21/2016 at pm  . Cyanocobalamin 500 MCG/0.1ML SOLN Place into the nose every 7 (seven) days. 1 spray into each nostril once a week   Past Week at Unknown time  . hydrochlorothiazide (MICROZIDE) 12.5 MG capsule Take 1 capsule (12.5 mg total) by mouth daily. 30 capsule 5 10/22/2016 at am  . levothyroxine (SYNTHROID, LEVOTHROID) 25 MCG tablet Take 1 tablet (25 mcg total) by mouth daily before breakfast. 30 tablet 11 10/22/2016 at am  . LORazepam (ATIVAN) 0.5 MG tablet Take 0.5 mg by mouth daily as needed for anxiety.    PRN at PRN  . nabumetone (RELAFEN) 500 MG tablet Take 500 mg by mouth 2 (two) times daily as needed for mild pain.    Past Month at Unknown time  . Oxygen Permeable Lens Products (EQ CONTACT LENS CLEANER) SOLN daily.   10/22/2016 at am  . SUMAtriptan (IMITREX) 25 MG tablet Take 1-2 tablets (25-50 mg total) by mouth once as needed for migraine. May repeat one dose in 2 hours if headache persists, for max 24 hr 12 tablet 2 prn at prn  . venlafaxine XR (EFFEXOR-XR) 75 MG 24 hr capsule Take 1 capsule (75 mg total) by mouth at bedtime. (Patient taking differently: Take 75 mg  by mouth 2 (two) times daily. ) 90 capsule 3 10/22/2016 at am  . Cholecalciferol (VITAMIN D) 2000 units tablet Take 1 tablet (2,000 Units total) by mouth daily. (Patient not taking: Reported on 10/22/2016)   Not Taking at Unknown time    Patient Stressors: Health problems Traumatic event  Patient Strengths: Ability for insight Active sense of humor Capable of independent living Communication skills Supportive family/friends  Treatment Modalities: Medication Management, Group therapy, Case management,  1 to 1 session with clinician, Psychoeducation, Recreational therapy.   Physician Treatment Plan for Primary Diagnosis: Severe bipolar I disorder, most recent episode depressed without  psychotic features ([place]) Long Term Goal(s): Improvement in symptoms so as ready for discharge NA   Short Term Goals: Ability to identify changes in lifestyle to reduce recurrence of condition will improve Ability to verbalize feelings will improve Ability to disclose and discuss suicidal ideas Ability to demonstrate self-control will improve Ability to identify and develop effective coping behaviors will improve Ability to maintain clinical measurements within normal limits will improve Ability to identify triggers associated with substance abuse/mental health issues will improve NA  Medication Management: Evaluate patient's response, side effects, and tolerance of medication regimen.  Therapeutic Interventions: 1 to 1 sessions, Unit Group sessions and Medication administration.  Evaluation of Outcomes: Adequate for Discharge  Physician Treatment Plan for Secondary Diagnosis: Principal Problem:   Severe bipolar I disorder, most recent episode depressed without psychotic features ([place]) Active Problems:   Morbid (severe) obesity due to excess calories (H[facility])   Hypothyroidism   Hypertension   Vitamin D deficiency   Ankylosing spondylitis ([place])  Long Term Goal(s): Improvement in symptoms so as ready for discharge NA   Short Term Goals: Ability to identify changes in lifestyle to reduce recurrence of condition will improve Ability to verbalize feelings will improve Ability to disclose and discuss suicidal ideas Ability to demonstrate self-control will improve Ability to identify and develop effective coping behaviors will improve Ability to maintain clinical measurements within normal limits will improve Ability to identify triggers associated with substance abuse/mental health issues will improve NA     Medication Management: Evaluate patient's response, side effects, and tolerance of medication regimen.  Therapeutic Interventions: 1 to 1 sessions, Unit Group sessions and  Medication administration.  Evaluation of Outcomes: Adequate for Discharge   RN Treatment Plan for Primary Diagnosis: Severe bipolar I disorder, most recent episode depressed without psychotic features ([place]) Long Term Goal(s): Knowledge of disease and therapeutic regimen to maintain health will improve  Short Term Goals: Ability to demonstrate self-control, Ability to disclose and discuss suicidal ideas and Compliance with prescribed medications will improve  Medication Management: RN will administer medications as ordered by provider, will assess and evaluate patient's response and provide education to patient for prescribed medication. RN will report any adverse and/or side effects to prescribing provider.  Therapeutic Interventions: 1 on 1 counseling sessions, Psychoeducation, Medication administration, Evaluate responses to treatment, Monitor vital signs and CBGs as ordered, Perform/monitor CIWA, COWS, AIMS and Fall Risk screenings as ordered, Perform wound care treatments as ordered.  Evaluation of Outcomes: Adequate for Discharge   LCSW Treatment Plan for Primary Diagnosis: Severe bipolar I disorder, most recent episode depressed without psychotic features ([place]) Long Term Goal(s): Safe transition to appropriate next level of care at discharge, Engage patient in therapeutic group addressing interpersonal concerns.  Short Term Goals: Engage patient in aftercare planning with referrals and resources, Increase social support and Increase emotional regulation  Therapeutic Interventions: Assess for all  discharge needs, 1 to 1 time with Education officer, museum, Explore available resources and support systems, Assess for adequacy in community support network, Educate family and significant other(s) on suicide prevention, Complete Psychosocial Assessment, Interpersonal group therapy.  Evaluation of Outcomes: Adequate for Discharge   Progress in Treatment: Attending groups: Yes. Participating in  groups: Yes. Taking medication as prescribed: Yes. Toleration medication: Yes. Family/Significant other contact made: Yes, individual(s) contacted:  CSW spoke with patient's mother. Patient understands diagnosis: Yes. Discussing patient identified problems/goals with staff: Yes. Medical problems stabilized or resolved: Yes. Denies suicidal/homicidal ideation: Yes. Issues/concerns per patient self-inventory: No.  New problem(s) identified: No, Describe:  None identified.  New Short Term/Long Term Goal(s): Patient stated that her goal is to get on medications and implement healthier coping mechanisms.  Discharge Plan or Barriers: Patient will discharge home and follow-up with [facility].  Reason for Continuation of Hospitalization: Anxiety Depression Medication stabilization  Suicidal Ideation   Estimated Length of Stay: D/C 10/24/2016  Attendees: Patient: Leah Osborne  10/24/2016 11:33 AM  Physician: Dr. Orson Slick, MD  10/24/2016 11:33 AM  Nursing: Elige Radon, BSN, RN 10/24/2016 11:33 AM  RN Care Manager: 10/24/2016 11:33 AM  Social Worker: Glorious Peach, MSW, LCSW-A 10/24/2016 11:33 AM  Recreational Therapist: Drue Flirt, [place], [place]  10/24/2016 11:33 AM  Other:  10/24/2016 11:33 AM  Other:  10/24/2016 11:33 AM  Other: 10/24/2016 11:33 AM    Scribe for Treatment Team: Emilie Rutter, [place] 10/24/2016 11:33 AM

## 2016-10-24 NOTE — Progress Notes (Signed)
D: Patient is aware of  Discharge this shift .Patient denies suicidal /homicidal ideations. Patient received all belongings brought in  A: No Storage medications. Writer reviewed Discharge Summary, Suicide Risk Assessment, and Transitional Record. Patient also received Prescriptions   A: Writer instructed on discharge criteria  . Aware  Of follow up appointment . R: Patient left unit with no questions  Or concerns  With  family

## 2016-10-24 NOTE — BHH Suicide Risk Assessment (Signed)
[facility] Discharge Suicide Risk Assessment   Principal Problem: Severe bipolar I disorder, most recent episode depressed without psychotic features [facility]) Discharge Diagnoses:  Patient Active Problem List   Diagnosis Date Noted  . PTSD (post-traumatic stress disorder) [F43.10] 10/24/2016  . Severe bipolar I disorder, most recent episode depressed without psychotic features ([place]) [F31.4] 10/23/2016  . Ankylosing spondylitis ([place]) [M45.9] 10/23/2016  . Sacroiliac inflammation ([place]) [M46.1] 08/02/2016  . Hypertension [I10] 05/28/2016  . Vitamin D deficiency [E55.9] 05/02/2016  . 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 Hypothyroidism [E03.9] 02/13/2016  . Anxiety and depression [F41.9, F32.9] 01/23/2016  . Status post tubal ligation [Z98.51] 09/22/2015  . Morbid (severe) obesity due to excess calories ([place]) [E66.01] 04/25/2015  . Anemia, iron deficiency [D50.9] 01/04/2015  . Headache, migraine [G43.909] 01/04/2015  . Abnormal cytological findings in female genital organs [R87.69] 07/04/2011    Total Time spent with patient: 30 minutes  Musculoskeletal: Strength & Muscle Tone: within normal limits Gait & Station: normal Patient leans: N/A  Psychiatric Specialty Exam: Review of Systems  All other systems reviewed and are negative.   Blood pressure 126/71, pulse 92, temperature 98.2 F (36.8 C), resp. rate 18, height 5\' 1"  (1.549 m), weight 122 kg (269 lb), SpO2 99 %.Body mass index is 50.83 kg/m.  General Appearance: Casual  Eye Contact::  Good  Speech:  Clear and Coherent409  Volume:  Normal  Mood:  Euthymic  Affect:  Appropriate  Thought Process:  Goal Directed and Descriptions of Associations: Intact  Orientation:  Full (Time, Place, and Person)  Thought Content:  WDL  Suicidal Thoughts:  No  Homicidal Thoughts:  No  Memory:  Immediate;   Fair Recent;   Fair Remote;   Fair  Judgement:  Fair  Insight:  Fair  Psychomotor  Activity:  Normal  Concentration:  Fair  Recall:  [employer] of [place]  Language: Fair  Akathisia:  No  Handed:  Right  AIMS (if indicated):     Assets:  Communication Skills Desire for Improvement Financial Resources/Insurance Housing Intimacy Physical Health Resilience Social Support Transportation  Sleep:  Number of Hours: 8  Cognition: WNL  ADL's:  Intact   Mental Status Per Nursing Assessment::   On Admission:  Suicide plan, Self-harm behaviors, Intention to act on suicide plan, NA  Demographic Factors:  Caucasian and Unemployed  Loss Factors: Loss of significant relationship  Historical Factors: Family history of mental illness or substance abuse and Impulsivity  Risk Reduction Factors:   Responsible for children under 66 years of age, Sense of responsibility to family, Living with another person, especially a relative, Positive social support and Positive therapeutic relationship  Continued Clinical Symptoms:  Bipolar Disorder:   Depressive phase Depression:   Impulsivity Severe  Cognitive Features That Contribute To Risk:  None    Suicide Risk:  Minimal: No identifiable suicidal ideation.  Patients presenting with no risk factors but with morbid ruminations; may be classified as minimal risk based on the severity of the depressive symptoms  Follow-up Information    [facility], [facility]. Go on 10/31/2016.   Why:  Please follow-up with [employer] [facility] on May 16th at [facility] with Olevia Bowens. Please bring discharge paperwork to this appointment. Please do not hestitate to text or call therapist if needed. Contact information: [place] [id number] [phone]           Plan Of CarAs tolerated.ollow-up recommendations:  Activity:  As  tolerated. Diet:  Low sodium heart healthy. Other:  Keep follow-up appointments.  Orson Slick, MD 10/24/2016, 11:40 AM

## 2016-10-24 NOTE — Progress Notes (Signed)
D: Pt denies SI/HI/AVH, affect is flat and sad, patient was pleasant and cooperative. Pt expressed to the nurse that she wanted to sign a 72 hours release form ; staff explained how the procedure works and was given the form to sign. Patient afterward appears less anxious and she is interacting with peers and staff appropriately.  A: Pt was offered support and encouragement. Pt was given scheduled medications. Pt was encouraged to attend groups. Q 15 minute checks were done for safety.  R:Pt attends groups and interacts well with peers and staff. Pt is taking medication. Pt has no complaints.Pt receptive to treatment and safety maintained on unit.

## 2016-10-24 NOTE — Plan of Care (Signed)
Problem: [facility] Participation in Recreation Therapeutic Interventions Goal: STG-Patient will demonstrate improved self esteem by identif STG: Self-Esteem - Within 3 treatment sessions, patient will verbalize at least 5 positive affirmation statements in one treatment session to increase self-esteem.  Outcome: Completed/Met Date Met: 10/24/16 Treatment Session 1; Completed 1 out of 1: At approximately 11:30 am, LRT met with patient in consultation room. Patient verbalized 5 positive affirmation statements. Patient reported it felt "good". LRT encouraged patient to continue saying positive affirmation statements.  Leonette Monarch, LRT/CTRS 05.09.18 2:31 pm Goal: STG-Other Recreation Therapy Goal (Specify) STG: Stress Management - Within 3 treatment sessions, patient will verbalize understanding of the stress management techniques in one treatment session to increase stress management skills.  Outcome: Completed/Met Date Met: 10/24/16 Treatment Session 1; Completed 1 out of 1: At approximately 11:30 am, LRT met with patient in consultation room. LRT educated and provided patient with handouts on stress management techniques. Patient verbalized understanding. LRT encouraged patient to read over and practice the stress management techniques.  Leonette Monarch, LRT/CTRS 05.09.18 2:33 pm

## 2016-10-24 NOTE — Progress Notes (Signed)
Recreation Therapy Notes  INPATIENT RECREATION TR PLAN  Patient Details Name: Leah Osborne MRN: [id number] DOB: 08-16-1985 Today's Date: 10/24/2016  Rec Therapy Plan Is patient appropriate for Therapeutic Recreation?: Yes Treatment times per week: At least once a week TR Treatment/Interventions: 1:1 session, Group participation (Comment) (Appropriate participation in daily recreational therapy tx)  Discharge Criteria Pt will be discharged from therapy if:: Treatment goals are met, Discharged Treatment plan/goals/alternatives discussed and agreed upon by:: Patient/family  Discharge Summary Short term goals set: See Care Plan Short term goals met: Complete Progress toward goals comments: One-to-one attended One-to-one attended: Self-esteem, stress management Reason goals not met: N/A Therapeutic equipment acquired: None Reason patient discharged from therapy: Discharge from hospital Pt/family agrees with progress & goals achieved: Yes Date patient discharged from therapy: 10/24/16   Leonette Monarch, LRT/CTRS 10/24/2016, 2:34 PM

## 2016-10-24 NOTE — Plan of Care (Signed)
Problem: Medication: Goal: Compliance with prescribed medication regimen will improve Outcome: Progressing Patient is compliant with medication.

## 2016-10-24 NOTE — Progress Notes (Signed)
  [facility] Adult Case Management Discharge Plan :  Will you be returning to the same living situation after discharge:  Yes,  home with husband. At discharge, do you have transportation home?: Yes,  husband will pick pt up. Do you have the ability to pay for your medications: Yes,  [employer]  Release of information consent forms completed and in the chart;  Patient's signature needed at discharge.  Patient to Follow up at: Follow-up Information    Pc, [facility]. Go on 10/31/2016.   Why:  Please follow-up with [facility] on May 16th at [facility] with Olevia Bowens. Please bring discharge paperwork to this appointment. Please do not hestitate to text or call therapist if needed. Contact information: [place] [address] [phone]           Next level of care provider has access to [place] and Suicide Prevention discussed: Yes,  SPE completed with mother and patient.  Have you used any form of tobacco in the last 30 days? (Cigarettes, Smokeless Tobacco, Cigars, and/or Pipes): No  Has patient been referred to the Quitline?: N/A patient is not a smoker  Patient has been referred for addiction treatment: [place], MSW, LCSW-A 10/24/2016, 12:06 PM
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
## 2016-10-24 NOTE — B[facility] Group Notes (Addendum)
  [facility] LCSW Group Therapy Note  Date/Time: 10/24/16, 0930  Type of Therapy/Topic:  Group Therapy:  Emotion Regulation  Participation Level:  Did Not Attend   Mood:  Description of Group:    The purpose of this group is to assist patients in learning to regulate negative emotions and experience positive emotions. Patients will be guided to discuss ways in which they have been vulnerable to their negative emotions. These vulnerabilities will be juxtaposed with experiences of positive emotions or situations, and patients challenged to use positive emotions to combat negative ones. Special emphasis will be placed on coping with negative emotions in conflict situations, and patients will process healthy conflict resolution skills.  Therapeutic Goals: 1. Patient will identify two positive emotions or experiences to reflect on in order to balance out negative emotions:  2. Patient will label two or more emotions that they find the most difficult to experience:  3. Patient will be able to demonstrate positive conflict resolution skills through discussion or role plays:   Summary of Patient Progress:       Therapeutic Modalities:   Cognitive Behavioral Therapy Feelings Identification Dialectical Behavioral Therapy  Lurline Idol, LCSW

## 2016-10-24 NOTE — BHH Group Notes (Signed)
[place] Group Notes:  (Nursing/MHT/Case Management/Adjunct)  Date:  10/24/2016  Time:  4:03 AM  Type of Therapy:  Psychoeducational Skills  Participation Level:  Active  Participation Quality:  Appropriate  Affect:  Appropriate  Cognitive:  Appropriate  Insight:  Appropriate  Engagement in Group:  Engaged  Modes of Intervention:  Discussion, Socialization and Support  Summary of Progress/Problems:  Reece Agar 10/24/2016, 4:03 AM

## 2016-10-25 LAB — HEMOGLOBIN A1C
Hgb A1c MFr Bld: 5.5 % (ref 4.8–5.6)
Mean Plasma Glucose: 111 mg/dL

## 2016-11-15 ENCOUNTER — Ambulatory Visit (INDEPENDENT_AMBULATORY_CARE_PROVIDER_SITE_OTHER): Payer: Commercial Managed Care - PPO | Admitting: Family Medicine

## 2016-11-15 ENCOUNTER — Encounter: Payer: Self-pay | Admitting: Family Medicine

## 2016-11-15 ENCOUNTER — Other Ambulatory Visit: Payer: Self-pay | Admitting: Family Medicine

## 2016-11-15 VITALS — BP 117/64 | HR 84 | Temp 98.3°F | Resp 16 | Ht 60.0 in | Wt 278.0 lb

## 2016-11-15 DIAGNOSIS — Z23 Encounter for immunization: Secondary | ICD-10-CM | POA: Diagnosis not present

## 2016-11-15 DIAGNOSIS — T63301A Toxic effect of unspecified spider venom, accidental (unintentional), initial encounter: Secondary | ICD-10-CM

## 2016-11-15 DIAGNOSIS — E034 Atrophy of thyroid (acquired): Secondary | ICD-10-CM

## 2016-11-15 DIAGNOSIS — I10 Essential (primary) hypertension: Secondary | ICD-10-CM | POA: Diagnosis not present

## 2016-11-15 DIAGNOSIS — D492 Neoplasm of unspecified behavior of bone, soft tissue, and skin: Secondary | ICD-10-CM | POA: Diagnosis not present

## 2016-11-15 MED ORDER — MUPIROCIN 2 % EX OINT: 1.0000 "application " | TOPICAL_OINTMENT | Freq: Two times a day (BID) | CUTANEOUS | 0 refills | Status: DC

## 2016-11-15 MED ORDER — LEVOTHYROXINE SODIUM 25 MCG PO TABS: 37.5000 ug | ORAL_TABLET | Freq: Every day | ORAL | 11 refills | Status: DC

## 2016-11-15 MED ORDER — HYDROCHLOROTHIAZIDE 12.5 MG PO CAPS: 12.5000 mg | ORAL_CAPSULE | Freq: Every day | ORAL | 3 refills | Status: DC

## 2016-11-15 NOTE — Patient Instructions (Signed)
Thank you for coming to the clinic today.  1. For spider bite, possibly brown recluse - Start Bactroban (Mupirocin) antibiotic ointment twice daily for 2 weeks - May use ice, compression, elevation - If spreading redness or worsening infection, pus, fevers/chills, let me know sooner may need oral antibiotic  2. Referral to Dermatology - if no apt within 2 weeks, call them and then check with [place] if needed  For skin tag removal  [facility]   [place], [place] [address] Hours: 8AM-5PM Phone: [phone]  Sarina Ser, MD  3. Increase Levothyroxine from 25 to 37.5mg  (ONE AND HALF tablets) daily, sent new rx - Order future blood test for TSH, Free T4 - 8 weeks schedule LAB ONLY visit  4. Blood pressure - agree with lower dose, sent new rx HCTZ 12.5mg  daily - Monitor BP if elevated >140/90 persistently, let me know sooner  Please schedule a Follow-up Appointment to: Return in about 4 months (around 03/17/2017) for blood pressure, mood, weight, thyroid.  If you have any other questions or concerns, please feel free to call the clinic or send a message through [employer]. You may also schedule an earlier appointment if necessary.  Nobie Putnam, DO [facility]

## 2016-11-15 NOTE — Progress Notes (Addendum)
Subjective:    Patient ID: Leah Osborne, female    DOB: [birth year], 31 y.o.   MRN: [id number]  Leah Osborne is a 31 y.o. female presenting on 11/15/2016 for Hypothyroidism (as per pt spot on her side Left thigh getting irritated now and spider bite onset week)   HPI   Spider Bite, R lower Leg - Reports new complaint with suspected spider bite on Right lower inner leg. She believes the spider was a brown recluse. Did not seek any medical attention or treatment at that time. She developed a sore and pustule with drainage and surrounding redness. Had some mild cramping and pain of surrounding muscles. Now seems to be gradually healing, scabbed over, without further drainage - Not using topical ointment or antibiotic - Denies fevers/chills, sweats, nausea, vomiting, further drainage of pus, spreading redness, swelling, pain  Skin Growth, Left Hip - Chronic problem for most of her life with skin tag on left hip, seems to have grown over several years, describes difficulty with sometimes it will get inflamed, in area that has pressure on it due to clothing, has bled in the past. - Requesting surgical excision and referral to Dermatology  HYPOTYROIDISM, Follow-up Had previously been stable on Levothyroxine 12mcg daily, without concerns. Recent hospitalization and had worsening depression with suicidal ideation, they tested labs and showed TSH mild elevated >5, advised her to return for re-check thyroid function. - Today patient interested in increased dose Levothyroxine. - Weight gain recently  CHRONIC HTN: Reports after leaving hospital she was told to take full dose HCTZ for 25mg  daily instead of 12.5mg , she was given new rx for this, but decided to lower back to 12.5mg  due to concerns about BP being too low, she thinks this was due to illness in hospital. Current Med: HCTZ 12.5mg  daily Reports good compliance, took meds today. Tolerating well, w/o complaints. Denies CP, dyspnea,  HA, edema, dizziness / lightheadedness  Additional question - asking about Vitamin B12 supplementation for energy. See other history on complex depression, with other comorbidities. She has B12 nasal spray asking if she can take this. And asking about injections. Her last Vitamin B12 test was 01/2016 and normal 500s.  Health Maintenance: - Due for TDap, also in setting of spider bite, will get this vaccine today  Social History  Substance Use Topics  . Smoking status: Never Smoker  . Smokeless tobacco: Never Used  . Alcohol use No    Review of Systems Per HPI unless specifically indicated above     Objective:    BP 117/64   Pulse 84   Temp 98.3 F (36.8 C) (Oral)   Resp 16   Ht 5' (1.524 m)   Wt 278 lb (126.1 kg)   BMI 54.29 kg/m   Wt Readings from Last 3 Encounters:  11/15/16 278 lb (126.1 kg)  10/23/16 269 lb (122 kg)  09/07/16 266 lb 9.6 oz (120.9 kg)    Physical Exam  Constitutional: She is oriented to person, place, and time. She appears well-developed and well-nourished. No distress.  Well-appearing, comfortable, cooperative, obese  HENT:  Head: Normocephalic and atraumatic.  Mouth/Throat: Oropharynx is clear and moist.  Cardiovascular: Normal rate, regular rhythm, normal heart sounds and intact distal pulses.   No murmur heard. Pulmonary/Chest: Effort normal.  Musculoskeletal: She exhibits no edema.  Neurological: She is alert and oriented to person, place, and time.  Skin: Skin is warm and dry. No rash noted. She is not diaphoretic. No erythema.  Right  Lower Leg, inner < 1cm circular ulceration with scab and healing tissue, surrounding mild erythema < 0.5 cm radius, without extending erythema. Non tender. No drainage. No fluctuance.  Left Hip (not pictured) Fleshy large skin tag, without inflammation, non tender, no bleeding  Psychiatric: She has a normal mood and affect. Her behavior is normal.  Well groomed, good eye contact, normal speech and thoughts    Nursing note and vitals reviewed.   Right lower Leg     Results for orders placed or performed during the hospital encounter of 10/23/16  Hemoglobin A1c  Result Value Ref Range   Hgb A1c MFr Bld 5.5 4.8 - 5.6 %   Mean Plasma Glucose 111 mg/dL  Lipid panel  Result Value Ref Range   Cholesterol 164 0 - 200 mg/dL   Triglycerides 117 <150 mg/dL   HDL 35 (L) >40 mg/dL   Total CHOL/HDL Ratio 4.7 RATIO   VLDL 23 0 - 40 mg/dL   LDL Cholesterol 106 (H) 0 - 99 mg/dL  TSH  Result Value Ref Range   TSH 5.234 (H) 0.350 - 4.500 uIU/mL      Assessment & Plan:   Problem List Items Addressed This Visit    Hypothyroidism    Mild elevated TSH >5, uncertain if significant symptoms, did have worsening depression and some suicidal ideation, recently behavioral health hospitalized. Also attributes some weight gain  Plan: 1. Increase dose Levothyroxine from 28mcg daily to 1.5 tabs for 37.36mcg daily, sent new rx to pharmacy 2. Future orders placed to check TSH and Free T4 in about 8 weeks, today is too early to re-check 3. Follow-up      Relevant Medications   levothyroxine (SYNTHROID, LEVOTHROID) 25 MCG tablet   Hypertension    Controlled BP on current dose, had mild elevated BP in hospital recently No known complications  Plan: 1. Agree with resuming lower dose HCTZ 12.5mg  daily instead of the previously rx 25mg  daily on discharge 2. Encourage wt loss and regular exercise 3. Monitor BP outside office 4. Follow-up as planned      Relevant Medications   hydrochlorothiazide (MICROZIDE) 12.5 MG capsule   Abnormal skin growth    Left hip Skin stag, chronic problem, growing now, tendency for irritation and inflammation with bleeding due to location - Desires removal - Referral to Dermatology, patient preference [facility]      Relevant Orders   Ambulatory referral to Dermatology    Other Visit Diagnoses    Spider bite wound, accidental or unintentional,  initial encounter    -  Primary  Possibly brown recluse bite with some localized tissue damage, otherwise may be other spider bite with mild localized infection, now healing. No evidence of complication - start bactroban BID for 2 weeks allow to heal, cover for possible secondary infection - Return criteria given if worsening    Relevant Medications   mupirocin ointment (BACTROBAN) 2 %   Need for Tdap vaccination       Relevant Orders   Tdap vaccine greater than or equal to 7yo IM (Completed)     Regarding additional question on B12 - advised her that her last lab was normal. Do not recommend B12 injections for decreased energy, as likely result of her other medical conditions depression, arthritis, pain, obesity. She may take oral or nasal supplement daily at recommended safe doses (nasal 500mg  weekly) or oral 500-1000mg  daily PRN if she desires. No testing at this time   Meds ordered this  encounter  Medications  . busPIRone (BUSPAR) 10 MG tablet    Sig: TK 1 T PO BID    Refill:  0  . mupirocin ointment (BACTROBAN) 2 %    Sig: Apply 1 application topically 2 (two) times daily. Use on affected wound, for up to 2 weeks    Dispense:  22 g    Refill:  0  . levothyroxine (SYNTHROID, LEVOTHROID) 25 MCG tablet    Sig: Take 1.5 tablets (37.5 mcg total) by mouth daily before breakfast.    Dispense:  45 tablet    Refill:  11  . hydrochlorothiazide (MICROZIDE) 12.5 MG capsule    Sig: Take 1 capsule (12.5 mg total) by mouth daily.    Dispense:  90 capsule    Refill:  3    Dose changed recently, patient does not need to fill this at this time, keep rx and refills on file    Follow up plan: Return in about 4 months (around 03/17/2017) for blood pressure, mood, weight, thyroid.  Nobie Putnam, DO [facility] 11/15/2016, 5:51 PM

## 2016-11-15 NOTE — Assessment & Plan Note (Addendum)
Left hip Skin stag, chronic problem, growing now, tendency for irritation and inflammation with bleeding due to location - Desires removal - Referral to Dermatology, patient preference [facility]

## 2016-11-15 NOTE — Assessment & Plan Note (Signed)
Mild elevated TSH >5, uncertain if significant symptoms, did have worsening depression and some suicidal ideation, recently behavioral health hospitalized. Also attributes some weight gain  Plan: 1. Increase dose Levothyroxine from 39mcg daily to 1.5 tabs for 37.17mcg daily, sent new rx to pharmacy 2. Future orders placed to check TSH and Free T4 in about 8 weeks, today is too early to re-check 3. Follow-up

## 2016-11-15 NOTE — Assessment & Plan Note (Signed)
Controlled BP on current dose, had mild elevated BP in hospital recently No known complications  Plan: 1. Agree with resuming lower dose HCTZ 12.5mg  daily instead of the previously rx 25mg  daily on discharge 2. Encourage wt loss and regular exercise 3. Monitor BP outside office 4. Follow-up as planned

## 2016-11-21 ENCOUNTER — Encounter: Payer: Self-pay | Admitting: Obstetrics and Gynecology

## 2016-11-21 ENCOUNTER — Ambulatory Visit (INDEPENDENT_AMBULATORY_CARE_PROVIDER_SITE_OTHER): Payer: Commercial Managed Care - PPO | Admitting: Obstetrics and Gynecology

## 2016-11-21 VITALS — BP 126/85 | HR 89 | Ht 60.0 in | Wt 277.5 lb

## 2016-11-21 DIAGNOSIS — Z9851 Tubal ligation status: Secondary | ICD-10-CM | POA: Diagnosis not present

## 2016-11-21 DIAGNOSIS — N939 Abnormal uterine and vaginal bleeding, unspecified: Secondary | ICD-10-CM

## 2016-11-21 DIAGNOSIS — E039 Hypothyroidism, unspecified: Secondary | ICD-10-CM | POA: Diagnosis not present

## 2016-11-21 NOTE — Addendum Note (Signed)
Addended by: Elouise Munroe on: 11/21/2016 11:11 AM   Modules accepted: Orders

## 2016-11-21 NOTE — Patient Instructions (Signed)
1. Endometrial biopsy is performed today 2. Return in 10 days for review of biopsy results and IUD insertion   Endometrial Biopsy, Care After This sheet gives you information about how to care for yourself after your procedure. Your health care provider may also give you more specific instructions. If you have problems or questions, contact your health care provider. What can I expect after the procedure? After the procedure, it is common to have:  Mild cramping.  A small amount of vaginal bleeding for a few days. This is normal.  Follow these instructions at home:  Take over-the-counter and prescription medicines only as told by your health care provider.  Do not douche, use tampons, or have sexual intercourse until your health care provider approves.  Return to your normal activities as told by your health care provider. Ask your health care provider what activities are safe for you.  Follow instructions from your health care provider about any activity restrictions, such as restrictions on strenuous exercise or heavy lifting. Contact a health care provider if:  You have heavy bleeding, or bleed for longer than 2 days after the procedure.  You have bad smelling discharge from your vagina.  You have a fever or chills.  You have a burning sensation when urinating or you have difficulty urinating.  You have severe pain in your lower abdomen. Get help right away if:  You have severe cramps in your stomach or back.  You pass large blood clots.  Your bleeding increases.  You become weak or light-headed, or you pass out. Summary  After the procedure, it is common to have mild cramping and a small amount of vaginal bleeding for a few days.  Do not douche, use tampons, or have sexual intercourse until your health care provider approves.  Return to your normal activities as told by your health care provider. Ask your health care provider what activities are safe for you. This  information is not intended to replace advice given to you by your health care provider. Make sure you discuss any questions you have with your health care provider. Document Released: 03/25/2013 Document Revised: 06/20/2016 Document Reviewed: 06/20/2016 Elsevier Interactive Patient Education  2017 [employer].

## 2016-11-21 NOTE — Progress Notes (Signed)
Chief complaint: 1. Abnormal uterine bleeding  31 year old white female, status post tubal ligation, presents for evaluation and management of abnormal uterine bleeding. She has been experiencing at least 3 weeks of spotting. She states that her menstrual cycles are typically heavy for about 7 days and commonly associated with 7 days of spotting prior to onset of the heavy period. She is feeling fatigued although recent CBC on 10/22/2016 was normal. She has been started on Synthroid for hypothyroidism.  Pelvic ultrasound 1 year ago demonstrated an arcuate shaped uterus. Endometrial stripe was 11.8 mm at that time. No previous endometrial biopsy has been performed.  Past medical history, past surgical history, problem list, medications, and allergies are reviewed  OBJECTIVE: BP 126/85   Pulse 89   Ht 5' (1.524 m)   Wt 277 lb 8 oz (125.9 kg)   LMP  (LMP Unknown) Comment: spottin x 3weeks  BMI 54.20 kg/m   Pleasant well-appearing female in no acute distress. Abdomen: Soft, nontender Pelvic exam: Bimanual exam-midplane uterus of normal size and shape, mobile, nontender  PROCEDURE: Endometrial biopsy Endometrial Biopsy Procedure Note  Pre-operative Diagnosis: Abnormal uterine bleeding  Post-operative Diagnosis: Abnormal uterine bleeding  Procedure Details   Urine pregnancy test was not done.  The risks (including infection, bleeding, pain, and uterine perforation) and benefits of the procedure were explained to the patient and Verbal informed consent was obtained.  Antibiotic prophylaxis against endocarditis was not indicated.   The patient was placed in the dorsal lithotomy position.  Bimanual exam showed the uterus to be in the neutral position.  A Graves' speculum inserted in the vagina, and the cervix prepped with povidone iodine.  Endocervical curettage with a Kevorkian curette was not performed.   A sharp tenaculum was not applied to the anterior lip of the cervix for  stabilization.  A sterile uterine sound was used to sound the uterus to a depth of 8cm.  A Mylex 70mm curette was used to sample the endometrium.  Sample was sent for pathologic examination.  Condition: Stable  Complications: None  Plan:  The patient was advised to call for any fever or for prolonged or severe pain or bleeding. She was advised to use OTC acetaminophen and OTC ibuprofen as needed for mild to moderate pain. She was advised to avoid vaginal intercourse for 48 hours or until the bleeding has completely stopped.  Attending Physician Documentation: Brayton Mars, MD   ASSESSMENT: 1. Abnormal uterine bleeding 2. Status post bilateral tubal ligation 3. New diagnosis of hypothyroidism, on Synthroid 4. Fatigue  PLAN: 1. Endometrial biopsy 2. Return in 10 days for follow-up on biopsies and Mirena IUD insertion  A total of 15 minutes were spent face-to-face with the patient during this encounter and over half of that time dealt with counseling and coordination of care.  Brayton Mars, MD  Note: This dictation was prepared with Dragon dictation along with smaller phrase technology. Any transcriptional errors that result from this process are unintentional.

## 2016-11-24 LAB — PATHOLOGY

## 2016-12-06 ENCOUNTER — Encounter: Payer: Self-pay | Admitting: Obstetrics and Gynecology

## 2016-12-06 ENCOUNTER — Ambulatory Visit (INDEPENDENT_AMBULATORY_CARE_PROVIDER_SITE_OTHER): Payer: Commercial Managed Care - PPO | Admitting: Obstetrics and Gynecology

## 2016-12-06 VITALS — BP 100/69 | HR 83 | Ht 60.0 in | Wt 279.7 lb

## 2016-12-06 DIAGNOSIS — Z9851 Tubal ligation status: Secondary | ICD-10-CM

## 2016-12-06 DIAGNOSIS — N939 Abnormal uterine and vaginal bleeding, unspecified: Secondary | ICD-10-CM | POA: Diagnosis not present

## 2016-12-07 NOTE — Progress Notes (Signed)
Chief complaint: 1. Mirena IUD insertion 2. History of morbid obesity 3. Status post tubal ligation 4. Abnormal uterine bleeding  Patient presents for Mirena IUD insertion for management of abnormal uterine bleeding. She does have her tubes tied.  IUD Insertion Procedure Note  Pre-operative Diagnosis: Abnormal uterine bleeding  Post-operative Diagnosis: Abnormal uterine bleeding  Indications: contraception and abnormal uterine bleeding management  Procedure Details  Urine pregnancy test was done  and result was negative.  The risks (including infection, bleeding, pain, and uterine perforation) and benefits of the procedure were explained to the patient and Verbal informed consent was obtained.    Cervix cleansed with Betadine. Uterus sounded to 10 cm. IUD inserted without difficulty. String visible and trimmed to 2 cm. Patient tolerated procedure well.  IUD Information: Mirena, Lot # , Expiration date. See scanned document.  Condition: Stable  Complications: None  Plan:  The patient was advised to call for any fever or for prolonged or severe pain or bleeding. She was advised to use OTC acetaminophen and OTC ibuprofen as needed for mild to moderate pain.   Attending Physician Documentation: Brayton Mars, MD  Note: This dictation was prepared with Dragon dictation along with smaller phrase technology. Any transcriptional errors that result from this process are unintentional.

## 2016-12-07 NOTE — Patient Instructions (Signed)
1. Mirena IUD is inserted today 2. Return in 4 weeks for IUD string check   Intrauterine Device Insertion, Care After This sheet gives you information about how to care for yourself after your procedure. Your health care provider may also give you more specific instructions. If you have problems or questions, contact your health care provider. What can I expect after the procedure? After the procedure, it is common to have:  Cramps and pain in the abdomen.  Light bleeding (spotting) or heavier bleeding that is like your menstrual period. This may last for up to a few days.  Lower back pain.  Dizziness.  Headaches.  Nausea.  Follow these instructions at home:  Before resuming sexual activity, check to make sure that you can feel the IUD string(s). You should be able to feel the end of the string(s) below the opening of your cervix. If your IUD string is in place, you may resume sexual activity. ? If you had a hormonal IUD inserted more than 7 days after your most recent period started, you will need to use a backup method of birth control for 7 days after IUD insertion. Ask your health care provider whether this applies to you.  Continue to check that the IUD is still in place by feeling for the string(s) after every menstrual period, or once a month.  Take over-the-counter and prescription medicines only as told by your health care provider.  Do not drive or use heavy machinery while taking prescription pain medicine.  Keep all follow-up visits as told by your health care provider. This is important. Contact a health care provider if:  You have bleeding that is heavier or lasts longer than a normal menstrual cycle.  You have a fever.  You have cramps or abdominal pain that get worse or do not get better with medicine.  You develop abdominal pain that is new or is not in the same area of earlier cramping and pain.  You feel lightheaded or weak.  You have abnormal or  bad-smelling discharge from your vagina.  You have pain during sexual activity.  You have any of the following problems with your IUD string(s): ? The string bothers or hurts you or your sexual partner. ? You cannot feel the string. ? The string has gotten longer.  You can feel the IUD in your vagina.  You think you may be pregnant, or you miss your menstrual period.  You think you may have an STI (sexually transmitted infection). Get help right away if:  You have flu-like symptoms.  You have a fever and chills.  You can feel that your IUD has slipped out of place. Summary  After the procedure, it is common to have cramps and pain in the abdomen. It is also common to have light bleeding (spotting) or heavier bleeding that is like your menstrual period.  Continue to check that the IUD is still in place by feeling for the string(s) after every menstrual period, or once a month.  Keep all follow-up visits as told by your health care provider. This is important.  Contact your health care provider if you have problems with your IUD string(s), such as the string getting longer or bothering you or your sexual partner. This information is not intended to replace advice given to you by your health care provider. Make sure you discuss any questions you have with your health care provider. Document Released: 01/31/2011 Document Revised: 04/25/2016 Document Reviewed: 04/25/2016 Elsevier Interactive Patient Education  2017 [employer] Inc[employer]  

## 2016-12-10 ENCOUNTER — Other Ambulatory Visit: Payer: Self-pay | Admitting: Family Medicine

## 2016-12-10 DIAGNOSIS — F419 Anxiety disorder, unspecified: Secondary | ICD-10-CM

## 2016-12-10 DIAGNOSIS — F329 Major depressive disorder, single episode, unspecified: Secondary | ICD-10-CM

## 2016-12-10 DIAGNOSIS — F32A Depression, unspecified: Secondary | ICD-10-CM

## 2016-12-10 DIAGNOSIS — F4310 Post-traumatic stress disorder, unspecified: Secondary | ICD-10-CM

## 2016-12-10 DIAGNOSIS — F314 Bipolar disorder, current episode depressed, severe, without psychotic features: Secondary | ICD-10-CM

## 2016-12-10 NOTE — Telephone Encounter (Signed)
Attempted to contact the pt, no answer. LMOM to return my call.  

## 2016-12-10 NOTE — Telephone Encounter (Signed)
Receved refill request from [facility] pharmacy for Venlafaxine (Effexor) 37.5mg  - and pharmacy states that her insurance will no longer pay for tablets, and they will cover CAPSULES instead, this needs to be changed on rx.  Could you call patient to clarify current dose? She has seen [facility] since last visit as well. She was ordered Venlafaxine XR 75mg  capsules previously, and was taking this TWICE daily.  I will need to send new script for capsules once we determine if it is 75mg  TWICE daily or 150mg  ONCE daily or other dose.  Thanks.  Nobie Putnam, [facility] 12/10/2016, 1:01 PM

## 2016-12-12 ENCOUNTER — Encounter: Payer: Self-pay | Admitting: Family Medicine

## 2016-12-12 MED ORDER — VENLAFAXINE HCL ER 150 MG PO CP24: 150.0000 mg | ORAL_CAPSULE | Freq: Every day | ORAL | 11 refills | Status: DC

## 2016-12-12 NOTE — Telephone Encounter (Signed)
Please see the mychart message that address this issue.

## 2016-12-12 NOTE — Telephone Encounter (Signed)
Reviewed MyChart message, we do not routinely run urine samples collected outside office also this would be considered an acute visit or sick visit, and we do not run tests and prescribe antibiotics without evaluation in office.  My best advice would be for her to go to Urgent Care or after hours clinic option after work for evaluation and testing and treatment of possible UTI.  Nobie Putnam, [facility] 12/12/2016, 12:12 PM

## 2016-12-12 NOTE — Addendum Note (Signed)
Addended by: Olin Hauser on: 12/12/2016 05:43 PM   Modules accepted: Orders

## 2016-12-13 ENCOUNTER — Encounter: Payer: Self-pay | Admitting: Emergency Medicine

## 2016-12-13 ENCOUNTER — Emergency Department
Admission: EM | Admit: 2016-12-13 | Discharge: 2016-12-13 | Disposition: A | Discharge status: Home / Self Care | Payer: Commercial Managed Care - PPO | Attending: Emergency Medicine | Admitting: Emergency Medicine

## 2016-12-13 DIAGNOSIS — E039 Hypothyroidism, unspecified: Secondary | ICD-10-CM | POA: Insufficient documentation

## 2016-12-13 DIAGNOSIS — I10 Essential (primary) hypertension: Secondary | ICD-10-CM | POA: Diagnosis not present

## 2016-12-13 DIAGNOSIS — R300 Dysuria: Secondary | ICD-10-CM | POA: Diagnosis present

## 2016-12-13 DIAGNOSIS — N3090 Cystitis, unspecified without hematuria: Secondary | ICD-10-CM | POA: Insufficient documentation

## 2016-12-13 LAB — URINALYSIS, COMPLETE (UACMP) WITH MICROSCOPIC
BILIRUBIN URINE: NEGATIVE
GLUCOSE, UA: NEGATIVE mg/dL
Ketones, ur: NEGATIVE mg/dL
NITRITE: NEGATIVE
PH: 5 (ref 5.0–8.0)
Protein, ur: NEGATIVE mg/dL
SPECIFIC GRAVITY, URINE: 1.026 (ref 1.005–1.030)

## 2016-12-13 LAB — CBC
HCT: 39.1 % (ref 35.0–47.0)
Hemoglobin: 12.9 g/dL (ref 12.0–16.0)
MCH: 26.7 pg (ref 26.0–34.0)
MCHC: 32.9 g/dL (ref 32.0–36.0)
MCV: 81.3 fL (ref 80.0–100.0)
PLATELETS: 476 10*3/uL — AB (ref 150–440)
RBC: 4.81 MIL/uL (ref 3.80–5.20)
RDW: 14.7 % — ABNORMAL HIGH (ref 11.5–14.5)
WBC: 14.1 10*3/uL — ABNORMAL HIGH (ref 3.6–11.0)

## 2016-12-13 LAB — COMPREHENSIVE METABOLIC PANEL
ALK PHOS: 66 U/L (ref 38–126)
ALT: 15 U/L (ref 14–54)
AST: 16 U/L (ref 15–41)
Albumin: 4.2 g/dL (ref 3.5–5.0)
Anion gap: 7 (ref 5–15)
BUN: 16 mg/dL (ref 6–20)
CALCIUM: 9 mg/dL (ref 8.9–10.3)
CO2: 26 mmol/L (ref 22–32)
CREATININE: 0.8 mg/dL (ref 0.44–1.00)
Chloride: 105 mmol/L (ref 101–111)
Glucose, Bld: 95 mg/dL (ref 65–99)
Potassium: 3.5 mmol/L (ref 3.5–5.1)
SODIUM: 138 mmol/L (ref 135–145)
Total Bilirubin: 0.4 mg/dL (ref 0.3–1.2)
Total Protein: 7.6 g/dL (ref 6.5–8.1)

## 2016-12-13 LAB — WET PREP, GENITAL
Clue Cells Wet Prep HPF POC: NONE SEEN
SPERM: NONE SEEN
Trich, Wet Prep: NONE SEEN
Yeast Wet Prep HPF POC: NONE SEEN

## 2016-12-13 LAB — POCT PREGNANCY, URINE: PREG TEST UR: NEGATIVE

## 2016-12-13 LAB — CHLAMYDIA/NGC RT PCR (ARMC ONLY)
Chlamydia Tr: NOT DETECTED
N gonorrhoeae: NOT DETECTED

## 2016-12-13 LAB — LIPASE, BLOOD: Lipase: 34 U/L (ref 11–51)

## 2016-12-13 MED ORDER — IBUPROFEN 800 MG PO TABS: 800.0000 mg | ORAL_TABLET | Freq: Three times a day (TID) | ORAL | 0 refills | Status: DC | PRN

## 2016-12-13 MED ORDER — PHENAZOPYRIDINE HCL 200 MG PO TABS: 200.0000 mg | ORAL_TABLET | Freq: Three times a day (TID) | ORAL | 0 refills | Status: DC | PRN

## 2016-12-13 MED ORDER — SULFAMETHOXAZOLE-TRIMETHOPRIM 800-160 MG PO TABS: 1.0000 | ORAL_TABLET | Freq: Two times a day (BID) | ORAL | 0 refills | Status: DC

## 2016-12-13 NOTE — ED Notes (Signed)
Pt reports vag discharge and vag bleeding x 3 days  Pt also has dysuria and low back pain.  No n/v/d.  Pt alert.

## 2016-12-13 NOTE — ED Triage Notes (Addendum)
Pt c/o dysuria and lower back pain, thinks has a UTI. No fevers. Ambulatory to triage. NAD. VSS. Has also had pain with intercourse as well as after 2 days ago. Has had some bleeding from vagina since having sex. Has had tubes tied. Has had lower abdominal cramping.  IUD was placed on 6/21.  Also has tubes tied.

## 2016-12-13 NOTE — ED Provider Notes (Signed)
[facility] Emergency Department Provider Note       Time seen: ----------------------------------------- 7:06 PM on 12/13/2016 -----------------------------------------     I have reviewed the triage vital signs and the nursing notes.   HISTORY   Chief Complaint Dysuria; vaginal pain; and Abdominal Pain    HPI Leah Osborne is a 31 y.o. female who presents to the ED for dysuria and lower back pain, concerned she may have a UTI. She's not had any fevers, chills, chest pain or difficulty breathing. She has had pain with intercourse approximately 2 days ago. She has had some bleeding vaginally since having sex. She is also complaining of lower abdominal cramping.   Past Medical History:  Diagnosis Date  . Abnormal Pap smear of cervix   . Bipolar 1 disorder ([place])   . Headache    MIGRAINES  . Hypertension   . PTSD (post-traumatic stress disorder)   . Social phobia     Patient Active Problem List   Diagnosis Date Noted  . Abnormal skin growth 11/15/2016  . PTSD (post-traumatic stress disorder) 10/24/2016  . Severe bipolar I disorder, most recent episode depressed without psychotic features ([place]) 10/23/2016  . Ankylosing spondylitis ([place]) 10/23/2016  . Sacroiliac inflammation ([place]) 08/02/2016  . Hypertension 05/28/2016  . Vitamin D deficiency 05/02/2016  . 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 Hypothyroidism 02/13/2016  . Anxiety and depression 01/23/2016  . Status post tubal ligation 09/22/2015  . Morbid (severe) obesity due to excess calories ([place]) 04/25/2015  . Anemia, iron deficiency 01/04/2015  . Headache, migraine 01/04/2015  . Abnormal cytological findings in female genital organs 07/04/2011    Past Surgical History:  Procedure Laterality Date  . CHOLECYSTECTOMY    . LAPAROSCOPIC TUBAL LIGATION Bilateral 09/19/2015   Procedure: LAPAROSCOPIC BILATERAL TUBAL BANDING;  Surgeon: Brayton Mars,  MD;  Location: [facility] ORS;  Service: Gynecology;  Laterality: Bilateral;    Allergies Patient has no known allergies.  Social History Social History  Substance Use Topics  . Smoking status: Never Smoker  . Smokeless tobacco: Never Used  . Alcohol use No    Review of Systems Constitutional: Negative for fever. Eyes: Negative for vision changes ENT:  Negative for congestion, sore throat Cardiovascular: Negative for chest pain. Respiratory: Negative for shortness of breath. Gastrointestinal: Positive for abdominal cramping Genitourinary: Positive for dysuria Musculoskeletal: Negative for back pain. Skin: Negative for rash. Neurological: Negative for headaches, focal weakness or numbness.  All systems negative/normal/unremarkable except as stated in the HPI  ____________________________________________   PHYSICAL EXAM:  VITAL SIGNS: ED Triage Vitals  Enc Vitals Group     BP 12/13/16 1736 134/79     Pulse Rate 12/13/16 1735 90     Resp 12/13/16 1735 18     Temp 12/13/16 1735 98.4 F (36.9 C)     Temp Source 12/13/16 1735 Oral     SpO2 12/13/16 1735 100 %     Weight 12/13/16 1735 276 lb (125.2 kg)     Height 12/13/16 1735 5\' 1"  (1.549 m)     Head Circumference --      Peak Flow --      Pain Score 12/13/16 1735 5     Pain Loc --      Pain Edu? --      Excl. in [place]? --     Constitutional: Alert and oriented. Well appearing and in no distress. Eyes: Conjunctivae are normal. Normal extraocular movements. ENT  Head: Normocephalic and atraumatic.   Nose: No congestion/rhinnorhea.   Mouth/Throat: Mucous membranes are moist.   Neck: No stridor. Cardiovascular: Normal rate, regular rhythm. No murmurs, rubs, or gallops. Respiratory: Normal respiratory effort without tachypnea nor retractions. Breath sounds are clear and equal bilaterally. No wheezes/rales/rhonchi. Gastrointestinal: Soft and nontender. Normal bowel sounds Genitourinary: Musculoskeletal:  Nontender with normal range of motion in extremities. No lower extremity tenderness nor edema. Neurologic:  Normal speech and language. No gross focal neurologic deficits are appreciated.  Skin:  Skin is warm, dry and intact. No rash noted. Psychiatric: Mood and affect are normal. Speech and behavior are normal.  ____________________________________________  ED COURSE:  Pertinent labs & imaging results that were available during my care of the patient were reviewed by me and considered in my medical decision making (see chart for details). Patient presents for abdominal pain, we will assess with labs and imaging as indicated.   Procedures ____________________________________________   LABS (pertinent positives/negatives)  Labs Reviewed  WET PREP, GENITAL - Abnormal; Notable for the following:       Result Value   WBC, Wet Prep HPF POC MANY (*)    All other components within normal limits  URINALYSIS, COMPLETE (UACMP) WITH MICROSCOPIC - Abnormal; Notable for the following:    Color, Urine YELLOW (*)    APPearance HAZY (*)    Hgb urine dipstick SMALL (*)    Leukocytes, UA LARGE (*)    Bacteria, UA RARE (*)    Squamous Epithelial / LPF 6-30 (*)    All other components within normal limits  CBC - Abnormal; Notable for the following:    WBC 14.1 (*)    RDW 14.7 (*)    Platelets 476 (*)    All other components within normal limits  CHLAMYDIA/NGC RT PCR ([facility] ONLY)  LIPASE, BLOOD  COMPREHENSIVE METABOLIC PANEL  POC URINE PREG, ED  POCT PREGNANCY, URINE   ____________________________________________  FINAL ASSESSMENT AND PLAN  Cystitis  Plan: Patient's labs and imaging were dictated above. Patient had presented for Dysuria and lower back pain. She does have evidence of UTI but may also be having some irritation from an IUD. She states she is not at risk for STD so we will wait for gonorrhea and chlamydia cultures. Otherwise she is encouraged to follow-up with GYN as  an outpatient.   Earleen Newport, MD   Note: This note was generated in part or whole with voice recognition software. Voice recognition is usually quite accurate but there are transcription errors that can and very often do occur. I apologize for any typographical errors that were not detected and corrected.     Earleen Newport, MD 12/13/16 2007

## 2017-01-03 ENCOUNTER — Encounter: Payer: Self-pay | Admitting: Obstetrics and Gynecology

## 2017-03-15 ENCOUNTER — Encounter: Payer: Commercial Managed Care - PPO | Admitting: Family Medicine

## 2017-03-22 ENCOUNTER — Encounter: Payer: Commercial Managed Care - PPO | Admitting: Family Medicine

## 2017-06-21 ENCOUNTER — Encounter: Payer: Self-pay | Admitting: Family Medicine

## 2017-06-21 ENCOUNTER — Ambulatory Visit: Payer: BLUE CROSS/BLUE SHIELD | Admitting: Family Medicine

## 2017-06-21 VITALS — BP 124/84 | HR 98 | Temp 98.6°F | Resp 16 | Ht 60.0 in | Wt 296.0 lb

## 2017-06-21 DIAGNOSIS — R509 Fever, unspecified: Secondary | ICD-10-CM

## 2017-06-21 DIAGNOSIS — E559 Vitamin D deficiency, unspecified: Secondary | ICD-10-CM

## 2017-06-21 DIAGNOSIS — J029 Acute pharyngitis, unspecified: Secondary | ICD-10-CM

## 2017-06-21 DIAGNOSIS — E034 Atrophy of thyroid (acquired): Secondary | ICD-10-CM | POA: Diagnosis not present

## 2017-06-21 MED ORDER — LEVOTHYROXINE SODIUM 25 MCG PO TABS: 25.0000 ug | ORAL_TABLET | Freq: Every day | ORAL | 5 refills | Status: DC

## 2017-06-21 NOTE — Progress Notes (Signed)
Subjective:    Patient ID: Leah Osborne, female    DOB: [birth year], 32 y.o.   MRN: [id number]  Leah Osborne is a 32 y.o. female presenting on 06/21/2017 for Sore Throat (fever chills achy nausea dizzy onset 2 days )   HPI   URI / SORE THROAT Reports symptoms started about 2-3 days ago with Left sided headache and sinus pressure, worsening to yesterday at work felt had a fever subjective, throat was "itchy" and sore worse last night - Took Ibuprofen yesterday with some relief. No OTC medicines today - Admits generalized body aches associated with chills and fever - Daughter sick URI x 1 week - Admits occasional thicker sinus congestion affecting her throat, has to clear it - Admits some nausea with dry heave without vomiting triggered by coughing - Denies cough and dyspnea, vomiting, diarrhea  Additional updates / Hypothyroidism - Requesting to resume HCTZ and Levothyroxine - has been off of these since July 2018, due to loss insurance. Had some weight increase No edema - Thinks her BP is better controlled may not need the BP med - Additionally regarding depression, self weaned off Venlafaxine since October 2018 - 1-2 sodas most days, Bottled water at work - Admits episodes of Palpitations sweating  Health Maintenance: Due for flu shot, but currently ill will hold off for now return for flu shot  Depression screen [facility] 2/9 05/28/2016 01/23/2016  Decreased Interest 0 2  Down, Depressed, Hopeless 0 2  PHQ - 2 Score 0 4  Altered sleeping 0 3  Tired, decreased energy 3 3  Change in appetite 0 3  Feeling bad or failure about yourself  0 2  Trouble concentrating 0 0  Moving slowly or fidgety/restless 1 0  Suicidal thoughts 0 1  PHQ-9 Score 4 16  Difficult doing work/chores - Extremely dIfficult    Social History   Tobacco Use  . Smoking status: Never Smoker  . Smokeless tobacco: Never Used  Substance Use Topics  . Alcohol use: No    Alcohol/week: 0.0 oz  . Drug  use: No    Review of Systems Per HPI unless specifically indicated above     Objective:    BP 124/84 (BP Location: Left Arm, Cuff Size: Large)   Pulse 98   Temp 98.6 F (37 C) (Oral)   Resp 16   Ht 5' (1.524 m)   Wt 296 lb (134.3 kg)   SpO2 99%   BMI 57.81 kg/m   Wt Readings from Last 3 Encounters:  06/21/17 296 lb (134.3 kg)  12/13/16 276 lb (125.2 kg)  12/06/16 279 lb 11.2 oz (126.9 kg)    Physical Exam  Constitutional: She is oriented to person, place, and time. She appears well-developed and well-nourished. No distress.  Mostly well appearing, comfortable, cooperative, obese  HENT:  Head: Normocephalic and atraumatic.  Mouth/Throat: Oropharynx is clear and moist.  Frontal / maxillary sinuses mild tender. Nares with some turbinate edema and congestion without purulence. Bilateral TMs obscured by some hard dry cerumen, not completely impacted. Oropharynx mild erythema posterior drainage without exudates, edema or asymmetry.  Eyes: Conjunctivae are normal. Right eye exhibits no discharge. Left eye exhibits no discharge.  Neck: Normal range of motion. Neck supple. No thyromegaly present.  Cardiovascular: Regular rhythm, normal heart sounds and intact distal pulses.  No murmur heard. Mild tachycardic  Pulmonary/Chest: Effort normal and breath sounds normal. No respiratory distress. She has no wheezes. She has no rales.  Musculoskeletal: Normal range  of motion. She exhibits no edema.  Lymphadenopathy:    She has no cervical adenopathy.  Neurological: She is alert and oriented to person, place, and time.  Skin: Skin is warm and dry. No rash noted. She is not diaphoretic. No erythema.  Psychiatric: She has a normal mood and affect. Her behavior is normal.  Well groomed, good eye contact, normal speech and thoughts  Nursing note and vitals reviewed.  Results for orders placed or performed in visit on 06/21/17  POCT rapid strep A  Result Value Ref Range   Rapid Strep A  Screen Negative Negative  POCT Influenza A/B  Result Value Ref Range   Influenza A, POC Negative Negative   Influenza B, POC Negative Negative      Assessment & Plan:   Problem List Items Addressed This Visit    Hypothyroidism    Prior elevated TSH improved on low dose levothyroxine 37.5, then self discontinued d/t insurance loss - Now wt gain, palpitations among other symptoms  Plan: 1. Resume dose Levothyroxine from 14mcg daily - new rx ordered 2. Future orders placed to check TSH and Free T4 in about 6 weeks 3. Follow-up      Relevant Medications   levothyroxine (SYNTHROID, LEVOTHROID) 25 MCG tablet   Other Relevant Orders   TSH   T4, free   BASIC METABOLIC PANEL WITH GFR   Vitamin D deficiency    Previously improved Re-check Vitamin D      Relevant Orders   VITAMIN D 25 Hydroxy (Vit-D Deficiency, Fractures)    Other Visit Diagnoses    Fever and chills    -  Primary   Relevant Orders   POCT Influenza A/B   Sore throat       Relevant Orders   POCT rapid strep A      Consistent with acute sinusitis, likely initially viral URI, no evidence acute bacterial infection. Considered viral vs possible flu vs strep - NEGATIVE rapid strep and Flu  Plan: 1. Reassurance, likely self-limited - no indication for antibiotics at this time - Start nasal steroid Flonase 2 sprays in each nostril daily for 4-6 weeks, may repeat course seasonally or as needed - Supportive care 2. Return criteria reviewed   Meds ordered this encounter  Medications  . levothyroxine (SYNTHROID, LEVOTHROID) 25 MCG tablet    Sig: Take 1 tablet (25 mcg total) by mouth daily before breakfast.    Dispense:  30 tablet    Refill:  5    Follow up plan: Return in about 6 weeks (around 08/02/2017) for labs,thryoid palpitations .  Nobie Putnam, [facility] 06/23/2017, 11:30 AM

## 2017-06-21 NOTE — Patient Instructions (Addendum)
Thank you for coming to the office today.  1.  Your flu test was NEGATIVE, this is not 100% though, and you can still have the flu with a negative test, otherwise it could be a different viral syndrome.  Start nasal steroid Flonase 2 sprays in each nostril daily for 4-6 weeks, may repeat course seasonally or as needed  Try Mucinex can get either regular or SInus (decongestant part) for 1 week  - Wash hands and cover cough very well to avoid spread of infection - For symptom control:      - Take Ibuprofen / Advil 400-600mg  every 6-8 hours as needed for fever / muscle aches, and may also take Tylenol 500-1000mg  per dose every 6-8 hours or 3 times a day, can alternate dosing  - Improve hydration with plenty of clear fluids  If significant worsening with poor fluid intake, worsening fever, difficulty breathing due to coughing, worsening body aches, weakness, or other more concerning symptoms difficulty breathing you can seek treatment at Emergency Department. Also if improved flu symptoms and then worsening days to week later with concerns for bronchitis, productive cough fever chills again we may need to check for possible pneumonia that can occur after the flu  Refilled Levothyroxine 66mcg once daily -   DUE for NON - FASTING BLOOD WORK   SCHEDULE "Lab Only" visit in the morning at the clinic for lab draw in [place]   - Make sure Lab Only appointment is at about 1 week before your next appointment, so that results will be available  For Lab Results, once available within 2-3 days of blood draw, you can can log in to MyChart online to view your results and a brief explanation. Also, we can discuss results at next follow-up visit.   Please schedule a follow-up appointment with Dr. Parks Ranger in 1-2 weeks as needed if worsening from viral / sinus  Can follow-up labs thyroid palpitations in 7-8 weeks If you have any other questions or concerns, please feel free to call the office or send  a message through [place]. You may also schedule an earlier appointment if necessary.  Additionally, you may be receiving a survey about your experience at our office within a few days to 1 week by e-mail or mail. We value your feedback.  Nobie Putnam, DO [place]

## 2017-06-23 LAB — POCT INFLUENZA A/B
INFLUENZA A, POC: NEGATIVE
INFLUENZA B, POC: NEGATIVE

## 2017-06-23 LAB — POCT RAPID STREP A (OFFICE): Rapid Strep A Screen: NEGATIVE

## 2017-06-23 NOTE — Assessment & Plan Note (Signed)
Previously improved Re-check Vitamin D

## 2017-06-23 NOTE — Assessment & Plan Note (Signed)
Prior elevated TSH improved on low dose levothyroxine 37.5, then self discontinued d/t insurance loss - Now wt gain, palpitations among other symptoms  Plan: 1. Resume dose Levothyroxine from 48mcg daily - new rx ordered 2. Future orders placed to check TSH and Free T4 in about 6 weeks 3. Follow-up

## 2017-07-14 ENCOUNTER — Other Ambulatory Visit: Payer: Self-pay

## 2017-07-14 ENCOUNTER — Encounter: Payer: Self-pay | Admitting: Family Medicine

## 2017-07-14 ENCOUNTER — Emergency Department
Admission: EM | Admit: 2017-07-14 | Discharge: 2017-07-14 | Disposition: A | Discharge status: Home / Self Care | Payer: BLUE CROSS/BLUE SHIELD | Attending: Emergency Medicine | Admitting: Emergency Medicine

## 2017-07-14 ENCOUNTER — Encounter: Payer: Self-pay | Admitting: Emergency Medicine

## 2017-07-14 DIAGNOSIS — K2900 Acute gastritis without bleeding: Secondary | ICD-10-CM | POA: Diagnosis not present

## 2017-07-14 DIAGNOSIS — I10 Essential (primary) hypertension: Secondary | ICD-10-CM | POA: Diagnosis not present

## 2017-07-14 DIAGNOSIS — E039 Hypothyroidism, unspecified: Secondary | ICD-10-CM | POA: Diagnosis not present

## 2017-07-14 DIAGNOSIS — Z79899 Other long term (current) drug therapy: Secondary | ICD-10-CM | POA: Diagnosis not present

## 2017-07-14 DIAGNOSIS — K2970 Gastritis, unspecified, without bleeding: Secondary | ICD-10-CM | POA: Diagnosis not present

## 2017-07-14 DIAGNOSIS — R112 Nausea with vomiting, unspecified: Secondary | ICD-10-CM | POA: Diagnosis not present

## 2017-07-14 LAB — URINALYSIS, COMPLETE (UACMP) WITH MICROSCOPIC
Bacteria, UA: NONE SEEN
Bilirubin Urine: NEGATIVE
GLUCOSE, UA: NEGATIVE mg/dL
KETONES UR: 5 mg/dL — AB
LEUKOCYTES UA: NEGATIVE
NITRITE: NEGATIVE
PH: 5 (ref 5.0–8.0)
Protein, ur: 30 mg/dL — AB
Specific Gravity, Urine: 1.029 (ref 1.005–1.030)

## 2017-07-14 LAB — COMPREHENSIVE METABOLIC PANEL
ALK PHOS: 60 U/L (ref 38–126)
ALT: 20 U/L (ref 14–54)
AST: 23 U/L (ref 15–41)
Albumin: 4 g/dL (ref 3.5–5.0)
Anion gap: 8 (ref 5–15)
BUN: 12 mg/dL (ref 6–20)
CALCIUM: 8.6 mg/dL — AB (ref 8.9–10.3)
CO2: 26 mmol/L (ref 22–32)
CREATININE: 0.77 mg/dL (ref 0.44–1.00)
Chloride: 104 mmol/L (ref 101–111)
Glucose, Bld: 106 mg/dL — ABNORMAL HIGH (ref 65–99)
Potassium: 3.7 mmol/L (ref 3.5–5.1)
Sodium: 138 mmol/L (ref 135–145)
TOTAL PROTEIN: 7.5 g/dL (ref 6.5–8.1)
Total Bilirubin: 0.7 mg/dL (ref 0.3–1.2)

## 2017-07-14 LAB — CBC
HCT: 41.8 % (ref 35.0–47.0)
Hemoglobin: 13.6 g/dL (ref 12.0–16.0)
MCH: 26.3 pg (ref 26.0–34.0)
MCHC: 32.7 g/dL (ref 32.0–36.0)
MCV: 80.5 fL (ref 80.0–100.0)
PLATELETS: 413 10*3/uL (ref 150–440)
RBC: 5.19 MIL/uL (ref 3.80–5.20)
RDW: 15.4 % — ABNORMAL HIGH (ref 11.5–14.5)
WBC: 12.4 10*3/uL — AB (ref 3.6–11.0)

## 2017-07-14 LAB — POCT PREGNANCY, URINE: Preg Test, Ur: NEGATIVE

## 2017-07-14 LAB — LIPASE, BLOOD: Lipase: 30 U/L (ref 11–51)

## 2017-07-14 MED ORDER — ONDANSETRON 4 MG PO TBDP
8.0000 mg | ORAL_TABLET | Freq: Once | ORAL | Status: AC
  Administered 2017-07-14: 8 mg via ORAL
  Filled 2017-07-14: qty 2

## 2017-07-14 MED ORDER — ONDANSETRON 4 MG PO TBDP: 4.0000 mg | ORAL_TABLET | Freq: Three times a day (TID) | ORAL | 0 refills | Status: DC | PRN

## 2017-07-14 NOTE — ED Triage Notes (Signed)
Pt to ED via POV c/o N/V since this morning. Pt states that she has vomited 4 times this morning. Pt denies abdominal pain. Pt states that she is currently on her cycle. Pt has hx/o anemia and has been feeling weak. Pt states that she has been in the bed all day. Pt ambulatory to triage without difficulty. Pt in NAD at this time.

## 2017-07-14 NOTE — ED Provider Notes (Signed)
[facility] Emergency Department Provider Note   ____________________________________________    I have reviewed the triage vital signs and the nursing notes.   HISTORY  Chief Complaint Nausea and Emesis     HPI Leah Osborne is a 32 y.o. female who presents with complaints of nausea and vomiting which started this morning.  Patient denies abdominal pain.  She reports at least 5 episodes of vomiting today.  She reports a history of anemia and thought that because she started her menstrual cycle today that may have caused her nausea and vomiting.  No sick contacts reported.  No recent travel.  No fevers or chills reported.  No body aches.  No diarrhea  Past Medical History:  Diagnosis Date  . Abnormal Pap smear of cervix   . Bipolar 1 disorder ([place])   . Headache    MIGRAINES  . Hypertension   . PTSD (post-traumatic stress disorder)   . Social phobia     Patient Active Problem List   Diagnosis Date Noted  . Abnormal skin growth 11/15/2016  . PTSD (post-traumatic stress disorder) 10/24/2016  . Severe bipolar I disorder, most recent episode depressed without psychotic features ([place]) 10/23/2016  . Ankylosing spondylitis ([place]) 10/23/2016  . Sacroiliac inflammation ([place]) 08/02/2016  . Hypertension 05/28/2016  . Vitamin D deficiency 05/02/2016  . 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 Hypothyroidism 02/13/2016  . Anxiety and depression 01/23/2016  . Status post tubal ligation 09/22/2015  . Morbid (severe) obesity due to excess calories ([place]) 04/25/2015  . Anemia, iron deficiency 01/04/2015  . Headache, migraine 01/04/2015  . Abnormal cytological findings in female genital organs 07/04/2011    Past Surgical History:  Procedure Laterality Date  . CHOLECYSTECTOMY    . LAPAROSCOPIC TUBAL LIGATION Bilateral 09/19/2015   Procedure: LAPAROSCOPIC BILATERAL TUBAL BANDING;  Surgeon: Brayton Mars, MD;   Location: [facility] ORS;  Service: Gynecology;  Laterality: Bilateral;    Prior to Admission medications   Medication Sig Start Date End Date Taking? Authorizing Provider  ARIPiprazole (ABILIFY) 10 MG tablet Take 10 mg by mouth daily.    [provider]  busPIRone (BUSPAR) 10 MG tablet TK 1 T PO BID 11/01/16   [provider]  levothyroxine (SYNTHROID, LEVOTHROID) 25 MCG tablet Take 1 tablet (25 mcg total) by mouth daily before breakfast. 06/21/17   Karamalegos, Devonne Doughty, DO  ondansetron (ZOFRAN ODT) 4 MG disintegrating tablet Take 1 tablet (4 mg total) by mouth every 8 (eight) hours as needed for nausea or vomiting. 07/14/17   Lavonia Drafts, MD     Allergies Patient has no known allergies.  Family History  Problem Relation Age of Onset  . Diabetes Maternal Grandmother   . Heart disease Maternal Grandmother   . Cancer Neg Hx     Social History Social History   Tobacco Use  . Smoking status: Never Smoker  . Smokeless tobacco: Never Used  Substance Use Topics  . Alcohol use: No    Alcohol/week: 0.0 oz  . Drug use: No    Review of Systems  Constitutional: No fever/chills Eyes: No visual changes.  ENT: No sore throat. Cardiovascular: Denies chest pain. Respiratory: Denies shortness of breath. Gastrointestinal: No abdominal pain, nausea and vomiting as above Genitourinary: Negative for dysuria.  Menstruating Musculoskeletal: Negative for body aches Skin: Negative for rash. Neurological: Negative for headaches    ____________________________________________   PHYSICAL EXAM:  VITAL SIGNS: ED Triage Vitals  Enc Vitals  Group     BP 07/14/17 1652 (!) 153/50     Pulse Rate 07/14/17 1649 (!) 102     Resp 07/14/17 1649 16     Temp 07/14/17 1649 98.1 F (36.7 C)     Temp Source 07/14/17 1649 Oral     SpO2 07/14/17 1649 100 %     Weight 07/14/17 1649 134.3 kg (296 lb)     Height 07/14/17 1649 1.549 m (5\' 1" )     Head Circumference --      Peak Flow --        Pain Score --      Pain Loc --      Pain Edu? --      Excl. in [place]? --     Constitutional: Alert and oriented. No acute distress. Pleasant and interactive Eyes: Conjunctivae are normal.   Nose: No congestion/rhinnorhea. Mouth/Throat: Mucous membranes are moist.    Cardiovascular: Normal rate, regular rhythm. Grossly normal heart sounds.  Good peripheral circulation. Respiratory: Normal respiratory effort.  No retractions. Gastrointestinal: Soft and nontender. No distention.  No CVA tenderness. Genitourinary: deferred Musculoskeletal:  Warm and well perfused Neurologic:  Normal speech and language. No gross focal neurologic deficits are appreciated.  Skin:  Skin is warm, dry and intact. No rash noted. Psychiatric: Mood and affect are normal. Speech and behavior are normal.  ____________________________________________   LABS (all labs ordered are listed, but only abnormal results are displayed)  Labs Reviewed  COMPREHENSIVE METABOLIC PANEL - Abnormal; Notable for the following components:      Result Value   Glucose, Bld 106 (*)    Calcium 8.6 (*)    All other components within normal limits  CBC - Abnormal; Notable for the following components:   WBC 12.4 (*)    RDW 15.4 (*)    All other components within normal limits  URINALYSIS, COMPLETE (UACMP) WITH MICROSCOPIC - Abnormal; Notable for the following components:   Color, Urine YELLOW (*)    APPearance CLOUDY (*)    Hgb urine dipstick LARGE (*)    Ketones, ur 5 (*)    Protein, ur 30 (*)    Squamous Epithelial / LPF 0-5 (*)    All other components within normal limits  LIPASE, BLOOD  POCT PREGNANCY, URINE  POC URINE PREG, ED   ____________________________________________  EKG   ____________________________________________  RADIOLOGY  None ____________________________________________   PROCEDURES  Procedure(s) performed: No  Procedures   Critical Care performed:  No ____________________________________________   INITIAL IMPRESSION / ASSESSMENT AND PLAN / ED COURSE  Pertinent labs & imaging results that were available during my care of the patient were reviewed by me and considered in my medical decision making (see chart for details).  Patient well-appearing in no acute distress.  Abdominal exam is reassuring.  Lab work is overall unremarkable, mild elevation in white blood cell count is nonspecific.  Will treat with p.o. Zofran for suspected gastritis, likely viral.  ----------------------------------------- 6:30 PM on 07/14/2017 -----------------------------------------  His nausea improved after treatment, lab work overall unremarkable.  Will discharge with p.o. Zofran and outpatient follow-up as needed.  Return precautions discussed      ____________________________________________   FINAL CLINICAL IMPRESSION(S) / ED DIAGNOSES  Final diagnoses:  Acute gastritis without hemorrhage, unspecified gastritis type        Note:  This document was prepared using Dragon voice recognition software and may include unintentional dictation errors.    Lavonia Drafts, MD 07/14/17 [phone]

## 2017-07-15 DIAGNOSIS — G4733 Obstructive sleep apnea (adult) (pediatric): Secondary | ICD-10-CM | POA: Diagnosis not present

## 2017-07-16 ENCOUNTER — Other Ambulatory Visit: Payer: Self-pay | Admitting: Family Medicine

## 2017-07-16 DIAGNOSIS — D500 Iron deficiency anemia secondary to blood loss (chronic): Secondary | ICD-10-CM

## 2017-07-19 ENCOUNTER — Encounter: Payer: Self-pay | Admitting: Family Medicine

## 2017-07-19 DIAGNOSIS — E039 Hypothyroidism, unspecified: Secondary | ICD-10-CM

## 2017-07-22 ENCOUNTER — Encounter: Payer: Self-pay | Admitting: *Deleted

## 2017-07-22 ENCOUNTER — Ambulatory Visit
Admission: EM | Admit: 2017-07-22 | Discharge: 2017-07-22 | Disposition: A | Discharge status: Home / Self Care | Payer: BLUE CROSS/BLUE SHIELD | Attending: Family Medicine | Admitting: Family Medicine

## 2017-07-22 DIAGNOSIS — R69 Illness, unspecified: Secondary | ICD-10-CM | POA: Diagnosis not present

## 2017-07-22 DIAGNOSIS — J111 Influenza due to unidentified influenza virus with other respiratory manifestations: Secondary | ICD-10-CM

## 2017-07-22 DIAGNOSIS — R509 Fever, unspecified: Secondary | ICD-10-CM

## 2017-07-22 DIAGNOSIS — J029 Acute pharyngitis, unspecified: Secondary | ICD-10-CM | POA: Diagnosis not present

## 2017-07-22 LAB — RAPID STREP SCREEN (MED CTR MEBANE ONLY): STREPTOCOCCUS, GROUP A SCREEN (DIRECT): NEGATIVE

## 2017-07-22 MED ORDER — OSELTAMIVIR PHOSPHATE 75 MG PO CAPS: 75.0000 mg | ORAL_CAPSULE | Freq: Two times a day (BID) | ORAL | 0 refills | Status: DC

## 2017-07-22 MED ORDER — LEVOTHYROXINE SODIUM 25 MCG PO TABS: 25.0000 ug | ORAL_TABLET | Freq: Every day | ORAL | 3 refills | Status: DC

## 2017-07-22 NOTE — ED Triage Notes (Signed)
Sore throat, fever, since yesterday.

## 2017-07-22 NOTE — Discharge Instructions (Signed)
Take medication as prescribed. Rest. Drink plenty of fluids.  ° °Follow up with your primary care physician this week as needed. Return to Urgent care for new or worsening concerns.  ° °

## 2017-07-22 NOTE — ED Provider Notes (Signed)
[facility] ____________________________________________  Time seen: Approximately 10:36 AM  I have reviewed the triage vital signs and the nursing notes.   HISTORY  Chief Complaint Sore Throat and Fever  HPI Leah Osborne is a 32 y.o. female  presenting for evaluation of sore throat, hoarse voice since yesterday.  Patient reports some cough accompanying this.  States had a fever last night with T-max being 101.  States yesterday morning woke up with hoarse throat and voice that progressively worsened into more of a sore throat.  Has taken some over-the-counter ibuprofen, last ibuprofen was taken to a few hours prior to arrival.  No other over-the-counter medications taken prior to arrival.  Reports last week had some similar complaints but had more GI accompanying symptoms.  States last week sickness had fully resolved.  Reports around a lot of sick people this weekend at a concert.  Also reports grandmother sick.  Overall continues to drink fluids well.  Denies other aggravating or alleviating factors. Denies chest pain, shortness of breath, abdominal pain, or rash. Denies recent sickness. Denies recent antibiotic use.   No LMP recorded. Patient is not currently having periods (Reason: IUD).  Olin Hauser, DO: PCP   Past Medical History:  Diagnosis Date  . Abnormal Pap smear of cervix   . Bipolar 1 disorder ([place])   . Headache    MIGRAINES  . Hypertension   . PTSD (post-traumatic stress disorder)   . Social phobia     Patient Active Problem List   Diagnosis Date Noted  . Abnormal skin growth 11/15/2016  . PTSD (post-traumatic stress disorder) 10/24/2016  . Severe bipolar I disorder, most recent episode depressed without psychotic features ([place]) 10/23/2016  . Ankylosing spondylitis ([place]) 10/23/2016  . Sacroiliac inflammation ([place]) 08/02/2016  . Hypertension 05/28/2016  . Vitamin D deficiency 05/02/2016  . 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 Hypothyroidism 02/13/2016  . Anxiety and depression 01/23/2016  . Status post tubal ligation 09/22/2015  . Morbid (severe) obesity due to excess calories ([place]) 04/25/2015  . Anemia, iron deficiency 01/04/2015  . Headache, migraine 01/04/2015  . Abnormal cytological findings in female genital organs 07/04/2011    Past Surgical History:  Procedure Laterality Date  . CHOLECYSTECTOMY    . LAPAROSCOPIC TUBAL LIGATION Bilateral 09/19/2015   Procedure: LAPAROSCOPIC BILATERAL TUBAL BANDING;  Surgeon: Brayton Mars, MD;  Location: [facility] ORS;  Service: Gynecology;  Laterality: Bilateral;     No current facility-administered medications for this encounter.   Current Outpatient Medications:  .  oseltamivir (TAMIFLU) 75 MG capsule, Take 1 capsule (75 mg total) by mouth every 12 (twelve) hours., Disp: 10 capsule, Rfl: 0  Allergies Patient has no known allergies.  Family History  Problem Relation Age of Onset  . Diabetes Maternal Grandmother   . Heart disease Maternal Grandmother   . Rheum arthritis Mother   . Healthy Father   . Cancer Neg Hx     Social History Social History   Tobacco Use  . Smoking status: Never Smoker  . Smokeless tobacco: Never Used  Substance Use Topics  . Alcohol use: No    Alcohol/week: 0.0 oz  . Drug use: No    Review of Systems Constitutional: As above.  ENT: Positive sore throat. Cardiovascular: Denies chest pain. Respiratory: Denies shortness of breath. Gastrointestinal: No abdominal pain.  Skin: Negative for rash. Neurological: Negative for headaches, focal weakness or numbness.   ____________________________________________   PHYSICAL EXAM:  VITAL  SIGNS: ED Triage Vitals  Enc Vitals Group     BP 07/22/17 0930 (!) 136/97     Pulse Rate 07/22/17 0930 (!) 110     Resp 07/22/17 0930 16     Temp 07/22/17 0930 98.1 F (36.7 C)     Temp Source 07/22/17 0930 Oral     SpO2 07/22/17 0930 100 %     Weight  07/22/17 0933 296 lb (134.3 kg)     Height 07/22/17 0933 5\' 1"  (1.549 m)     Head Circumference --      Peak Flow --      Pain Score --      Pain Loc --      Pain Edu? --      Excl. in [place]? --    Constitutional: Alert and oriented. Well appearing and in no acute distress. Eyes: Conjunctivae are normal.  Head: Atraumatic. No sinus tenderness to palpation. No swelling. No erythema.  Ears: Nontender, moderate cerumen present in bilateral canals, otherwise no erythema, unable to fully visualize TM, but normal appearing TMs bilaterally.   Nose:Slight nasal congestion  Mouth/Throat: Mucous membranes are moist. Mild pharyngeal erythema. No tonsillar swelling or exudate.  Neck: No stridor.  No cervical spine tenderness to palpation. Hematological/Lymphatic/Immunilogical: No cervical lymphadenopathy. Cardiovascular: Normal rate, regular rhythm. Grossly normal heart sounds.  Good peripheral circulation. Respiratory: Normal respiratory effort.  No retractions. No wheezes, rales or rhonchi. Good air movement.  Musculoskeletal: Ambulatory with steady gait.  Neurologic:  Normal speech and language. No gait instability. Skin:  Skin appears warm, dry and intact. No rash noted. Psychiatric: Mood and affect are normal. Speech and behavior are normal.  ___________________________________________   LABS (all labs ordered are listed, but only abnormal results are displayed)  Labs Reviewed  RAPID STREP SCREEN (NOT AT [facility])  CULTURE, GROUP A STREP [facility])    RADIOLOGY  No results found. ____________________________________________   PROCEDURES Procedures    INITIAL IMPRESSION / ASSESSMENT AND PLAN / ED COURSE  Pertinent labs & imaging results that were available during my care of the patient were reviewed by me and considered in my medical decision making (see chart for details).  Well-appearing patient.  No acute distress.  Quick strep negative, will culture.  Reports quick onset of symptoms  yesterday into today.  Concern for influenza-like illness.  Discussed use of Tamiflu, patient agrees, Rx given.  Encourage rest, fluids, supportive care.  Work note given for today and tomorrow. Discussed indication, risks and benefits of medications with patient.  Discussed follow up with Primary care physician this week. Discussed follow up and return parameters including no resolution or any worsening concerns. Patient verbalized understanding and agreed to plan.   ____________________________________________   FINAL CLINICAL IMPRESSION(S) / ED DIAGNOSES  Final diagnoses:  Influenza-like illness     ED Discharge Orders        Ordered    oseltamivir (TAMIFLU) 75 MG capsule  Every 12 hours     07/22/17 1043       Note: This dictation was prepared with Dragon dictation along with smaller phrase technology. Any transcriptional errors that result from this process are unintentional.         Marylene Land, NP 07/22/17 1127

## 2017-07-24 ENCOUNTER — Other Ambulatory Visit: Payer: Self-pay

## 2017-07-24 DIAGNOSIS — E039 Hypothyroidism, unspecified: Secondary | ICD-10-CM

## 2017-07-24 LAB — CULTURE, GROUP A STREP (THRC)

## 2017-07-25 ENCOUNTER — Ambulatory Visit (INDEPENDENT_AMBULATORY_CARE_PROVIDER_SITE_OTHER): Payer: BLUE CROSS/BLUE SHIELD | Admitting: Family Medicine

## 2017-07-25 ENCOUNTER — Encounter: Payer: Self-pay | Admitting: Family Medicine

## 2017-07-25 VITALS — BP 109/70 | HR 65 | Temp 98.0°F | Resp 16 | Ht 60.0 in | Wt 291.0 lb

## 2017-07-25 DIAGNOSIS — J111 Influenza due to unidentified influenza virus with other respiratory manifestations: Secondary | ICD-10-CM

## 2017-07-25 MED ORDER — BENZONATATE 100 MG PO CAPS: 100.0000 mg | ORAL_CAPSULE | Freq: Three times a day (TID) | ORAL | 0 refills | Status: DC | PRN

## 2017-07-25 NOTE — Progress Notes (Signed)
Subjective:    Patient ID: Leah Osborne, female    DOB: Aug 28, 1985, 32 y.o.   MRN: [id number]  Leah Osborne is a 32 y.o. female presenting on 07/25/2017 for Fever (flu like Sxs, throwing up and diarrhea, sore throat SOB and paperwork obtw family diagnosed with flu onset 4 days)   HPI   Out previous week Mon 1/28 and Tues 1/29  Missed Mon 07/22/17 through today 07/25/17 - may go back tomorrow 2/8 or Monday 2/11  Urgent Care/ED FOLLOW-UP VISIT  Hospital/Location: [facility] Date of Visit: 07/22/17  Reason for Presenting to UC: Flu-like symptoms  FOLLOW-UP - ED provider note and record have been reviewed - Patient presents today about 3 days after recent UC visit. Brief summary of recent course, patient had symptoms of recent URI and worsening to flu-like symptoms, diagnosed clinically with flu, did not have flu test, treated with tamiflu and supportive care. Yesterday improved, felt better, resolved nausea vomiting and diarrhea. But still had cough especially if entered room with temperature change or go upstairs, had difficulty with coughing spells and  Taking Tamiflu has 1 more day of 75mg  BID, last dose 07/26/17 Taking Ibuprofen OTC 200mg  x 4 for 800mg  every 4-6 hours - did not take yesterday, after fever seemed to resolve, then took 1 dose this AM Has Zofran ODT from before PRN She does not feel able to return to work tomorrow, needs work note, she has been at job < 1 year, unable to do FMLA  Depression screen [facility] 2/9 05/28/2016 01/23/2016  Decreased Interest 0 2  Down, Depressed, Hopeless 0 2  PHQ - 2 Score 0 4  Altered sleeping 0 3  Tired, decreased energy 3 3  Change in appetite 0 3  Feeling bad or failure about yourself  0 2  Trouble concentrating 0 0  Moving slowly or fidgety/restless 1 0  Suicidal thoughts 0 1  PHQ-9 Score 4 16  Difficult doing work/chores - Extremely dIfficult    Social History   Tobacco Use  . Smoking status: Never Smoker  .  Smokeless tobacco: Never Used  Substance Use Topics  . Alcohol use: No    Alcohol/week: 0.0 oz  . Drug use: No    Review of Systems Per HPI unless specifically indicated above     Objective:    BP 109/70   Pulse 65   Temp 98 F (36.7 C) (Oral)   Resp 16   Ht 5' (1.524 m)   Wt 291 lb (132 kg)   SpO2 97%   BMI 56.83 kg/m   Wt Readings from Last 3 Encounters:  07/25/17 291 lb (132 kg)  07/22/17 296 lb (134.3 kg)  07/14/17 296 lb (134.3 kg)    Physical Exam  Constitutional: She is oriented to person, place, and time. She appears well-developed and well-nourished. No distress.  Mildly ill and tired appearing, comfortable, cooperative, obese  HENT:  Head: Normocephalic and atraumatic.  Mouth/Throat: Oropharynx is clear and moist.  Eyes: Conjunctivae are normal. Right eye exhibits no discharge. Left eye exhibits no discharge.  Neck: Normal range of motion. Neck supple. No thyromegaly present.  Cardiovascular: Normal rate, regular rhythm, normal heart sounds and intact distal pulses.  No murmur heard. Pulmonary/Chest: Effort normal. No respiratory distress. She has no wheezes. She has no rales.  Slightly coarse breath sounds clear with cough, mostly good air movement  Musculoskeletal: Normal range of motion. She exhibits no edema.  Lymphadenopathy:    She  has no cervical adenopathy.  Neurological: She is alert and oriented to person, place, and time.  Skin: Skin is warm and dry. No rash noted. She is not diaphoretic. No erythema.  Psychiatric: Her behavior is normal.  Nursing note and vitals reviewed.  Results for orders placed or performed during the hospital encounter of 07/22/17  Rapid strep screen  Result Value Ref Range   Streptococcus, Group A Screen (Direct) NEGATIVE NEGATIVE  Culture, group A strep  Result Value Ref Range   Specimen Description      THROAT Performed at [facility], [address] [place], [place] [address]    Special Requests       NONE Reflexed from [id number] Performed at [facility], [address] [place], [place] [address]    Culture      NO GROUP A STREP (S.PYOGENES) ISOLATED Performed at [facility], [place] [address] [place], [place] [address]    Report Status 07/24/2017 FINAL       Assessment & Plan:   Problem List Items Addressed This Visit    None    Visit Diagnoses    Influenza    -  Primary   Relevant Medications   benzonatate (TESSALON) 100 MG capsule       Clinically diagnosed influenza on 07/22/17 despite no rapid flu test - Tolerating PO and well hydrated - No other focal findings of infection today Did not get Flu Shot this season  Plan: 1. Finish existing Tamiflu 75mg  capsules BID x 5 days, last dose tomorrow  2. Supportive care as advised with NSAID / Tylenol PRN fever/myalgias, improve hydration, may take OTC Cold/Flu meds 3. Continue Zofran ODT PRN nausea/vomiting 4. Start Tessalon Perls take 1 capsule up to 3 times a day as needed for cough 5. Return criteria given if significant worsening, consider post-influenza complications, otherwise follow-up if needed  Did not complete ADA paperwork since temporary illness influenza is not a disability, she does not have FMLA. Note written to explain absence.   Meds ordered this encounter  Medications  . benzonatate (TESSALON) 100 MG capsule    Sig: Take 1 capsule (100 mg total) by mouth 3 (three) times daily as needed for cough.    Dispense:  30 capsule    Refill:  0      Follow up plan: Return if symptoms worsen or fail to improve, for flu / cough.  Nobie Putnam, [facility] 07/25/2017, 3:24 PM

## 2017-07-25 NOTE — Patient Instructions (Addendum)
Thank you for coming to the office today.  Finish Tamiflu  - For symptom control:      - IF STILL NEEDED FOR FEVER - Take Ibuprofen / Advil 400-800mg  every 6-8 hours as needed for fever / muscle aches, and may also take Tylenol 500-1000mg  per dose every 6-8 hours or 3 times a day, can alternate dosing      - Start Tessalon perls one every 8 hours or 3 times a day as needed for cough      - Start OTC Mucinex-DM for cough and congestion for up to 7 days - Improve hydration with plenty of clear fluids  Do not think this qualifies for ADA.  If significant worsening with poor fluid intake, worsening fever, difficulty breathing due to coughing, worsening body aches, weakness, or other more concerning symptoms difficulty breathing you can seek treatment at Emergency Department. Also if improved flu symptoms and then worsening days to week later with concerns for bronchitis, productive cough fever chills again we may need to check for possible pneumonia that can occur after the flu  Please schedule a Follow-up Appointment to: Return if symptoms worsen or fail to improve, for flu / cough.    If you have any other questions or concerns, please feel free to call the office or send a message through [place]. You may also schedule an earlier appointment if necessary.  Additionally, you may be receiving a survey about your experience at our office within a few days to 1 week by e-mail or mail. We value your feedback.  Leah Putnam, DO [place]

## 2017-07-29 ENCOUNTER — Other Ambulatory Visit: Payer: Self-pay

## 2017-07-29 DIAGNOSIS — D500 Iron deficiency anemia secondary to blood loss (chronic): Secondary | ICD-10-CM

## 2017-07-29 DIAGNOSIS — E559 Vitamin D deficiency, unspecified: Secondary | ICD-10-CM

## 2017-07-29 DIAGNOSIS — E034 Atrophy of thyroid (acquired): Secondary | ICD-10-CM

## 2017-07-30 ENCOUNTER — Other Ambulatory Visit: Payer: Self-pay

## 2017-08-06 ENCOUNTER — Other Ambulatory Visit: Payer: BLUE CROSS/BLUE SHIELD

## 2017-08-06 DIAGNOSIS — E559 Vitamin D deficiency, unspecified: Secondary | ICD-10-CM | POA: Diagnosis not present

## 2017-08-06 DIAGNOSIS — E034 Atrophy of thyroid (acquired): Secondary | ICD-10-CM | POA: Diagnosis not present

## 2017-08-06 DIAGNOSIS — D500 Iron deficiency anemia secondary to blood loss (chronic): Secondary | ICD-10-CM | POA: Diagnosis not present

## 2017-08-07 LAB — BASIC METABOLIC PANEL WITH GFR
BUN: 10 mg/dL (ref 7–25)
CALCIUM: 8.9 mg/dL (ref 8.6–10.2)
CO2: 28 mmol/L (ref 20–32)
Chloride: 106 mmol/L (ref 98–110)
Creat: 0.63 mg/dL (ref 0.50–1.10)
GFR, EST NON AFRICAN AMERICAN: 121 mL/min/{1.73_m2} (ref >=60)
GFR, Est African American: 140 mL/min/{1.73_m2} (ref >=60)
Glucose, Bld: 84 mg/dL (ref 65–99)
Potassium: 4.1 mmol/L (ref 3.5–5.3)
Sodium: 138 mmol/L (ref 135–146)

## 2017-08-07 LAB — CBC WITH DIFFERENTIAL/PLATELET
BASOS PCT: 0.3 %
Basophils Absolute: 31 cells/uL (ref 0–200)
EOS ABS: 196 {cells}/uL (ref 15–500)
Eosinophils Relative: 1.9 %
HEMATOCRIT: 37.1 % (ref 35.0–45.0)
Hemoglobin: 12.3 g/dL (ref 11.7–15.5)
LYMPHS ABS: 2400 {cells}/uL (ref 850–3900)
MCH: 26 pg — AB (ref 27.0–33.0)
MCHC: 33.2 g/dL (ref 32.0–36.0)
MCV: 78.4 fL — AB (ref 80.0–100.0)
MPV: 9.3 fL (ref 7.5–12.5)
Monocytes Relative: 8.3 %
NEUTROS PCT: 66.2 %
Neutro Abs: 6819 cells/uL (ref 1500–7800)
Platelets: 412 10*3/uL — ABNORMAL HIGH (ref 140–400)
RBC: 4.73 10*6/uL (ref 3.80–5.10)
RDW: 13.9 % (ref 11.0–15.0)
Total Lymphocyte: 23.3 %
WBC: 10.3 10*3/uL (ref 3.8–10.8)
WBCMIX: 855 {cells}/uL (ref 200–950)

## 2017-08-07 LAB — IRON,TIBC AND FERRITIN PANEL
%SAT: 17 % (calc) (ref 11–50)
Ferritin: 32 ng/mL (ref 10–154)
IRON: 51 ug/dL (ref 40–190)
TIBC: 297 mcg/dL (calc) (ref 250–450)

## 2017-08-07 LAB — VITAMIN D 25 HYDROXY (VIT D DEFICIENCY, FRACTURES): Vit D, 25-Hydroxy: 20 ng/mL — ABNORMAL LOW (ref 30–100)

## 2017-08-07 LAB — T4, FREE: Free T4: 0.9 ng/dL (ref 0.8–1.8)

## 2017-08-07 LAB — TSH: TSH: 2.32 mIU/L

## 2017-08-15 DIAGNOSIS — G4733 Obstructive sleep apnea (adult) (pediatric): Secondary | ICD-10-CM | POA: Diagnosis not present

## 2017-08-20 ENCOUNTER — Encounter: Payer: Self-pay | Admitting: Family Medicine

## 2017-08-20 DIAGNOSIS — R002 Palpitations: Secondary | ICD-10-CM

## 2017-08-22 NOTE — Addendum Note (Signed)
Addended by: Olin Hauser on: 08/22/2017 08:12 AM   Modules accepted: Orders

## 2017-08-26 ENCOUNTER — Encounter: Payer: Self-pay | Admitting: Family Medicine

## 2017-08-26 ENCOUNTER — Telehealth: Payer: Self-pay | Admitting: Family Medicine

## 2017-08-26 NOTE — Telephone Encounter (Signed)
Pt had the flu in February and was out of work 2/4-7.  She said she left ADA accommadation form for work in case she needed to use it.  She wanted to make sure it was saved in her chart [phone]

## 2017-08-27 NOTE — Telephone Encounter (Signed)
I saw her on 07/25/17 for this problem, we reviewed the ADA form but Flu does not meet criteria as it is not a disability. We both agreed to not complete this form at that time.  I do not have a copy of this form, and I do not see one scanned into Media section of chart.  I am not sure if one is saved up front in office, otherwise, we do not have a copy of it.  She is asking on mychart as well, about how to fill it out, now her HR rep said it needed to be filled out.  Please notify her that she will need to have her HR rep fax [place] a copy of the form [phone] or she can drop a copy off, and she was unable to send it through [place] unfortunately.  Nobie Putnam, [facility] 08/27/2017, 5:42 PM

## 2017-08-28 NOTE — Telephone Encounter (Signed)
Patient communicated with Physician via my chart message and drop paperwork on 08/28/2017.

## 2017-08-29 NOTE — Telephone Encounter (Signed)
Completed pages 4, 5.  A - Does the patient have a physical or mental impairment - No B - Duration of Disability - N/A C - Identifiy major life activities substantially limited - N/A D- Job function affected - Unable to work / Contagious / 24/19 to 07/26/17 E - Acommodations - N/A  Leave of Absence [X]  - Continuous 07/22/17 - 07/26/17  Workplace Acommodation - N/A  To be faxed.  Nobie Putnam, [facility] 08/29/2017, 1:47 PM

## 2017-09-09 ENCOUNTER — Encounter: Payer: Self-pay | Admitting: Family Medicine

## 2017-09-09 ENCOUNTER — Ambulatory Visit: Payer: BLUE CROSS/BLUE SHIELD | Admitting: Family Medicine

## 2017-09-09 VITALS — BP 120/77 | HR 86 | Temp 98.6°F | Resp 16 | Ht 60.0 in | Wt 296.0 lb

## 2017-09-09 DIAGNOSIS — R319 Hematuria, unspecified: Secondary | ICD-10-CM | POA: Diagnosis not present

## 2017-09-09 DIAGNOSIS — N390 Urinary tract infection, site not specified: Secondary | ICD-10-CM

## 2017-09-09 LAB — POCT URINALYSIS DIPSTICK
Bilirubin, UA: NEGATIVE
GLUCOSE UA: NEGATIVE
KETONES UA: NEGATIVE
Nitrite, UA: NEGATIVE
Protein, UA: NEGATIVE
SPEC GRAV UA: 1.01 (ref 1.010–1.025)
Urobilinogen, UA: 0.2 E.U./dL
pH, UA: 5 (ref 5.0–8.0)

## 2017-09-09 MED ORDER — CEPHALEXIN 500 MG PO CAPS: 500.0000 mg | ORAL_CAPSULE | Freq: Three times a day (TID) | ORAL | 0 refills | Status: DC

## 2017-09-09 NOTE — Patient Instructions (Addendum)
Thank you for coming to the office today.  1. You have a Urinary Tract Infection - this is very common, your symptoms are reassuring and you should get better within 1 week on the antibiotics - Start Keflex 500mg  3 times daily for next 7 days, complete entire course, even if feeling better - We sent urine for a culture, we will call you within next few days if we need to change antibiotics - Please drink plenty of fluids, improve hydration over next 1 week  If symptoms worsening, developing nausea / vomiting, worsening back pain, fevers / chills / sweats, then please return for re-evaluation sooner.  To prevent bladder and kidney infections...  Wipe front to back after using the restroom  Drink enough water to keep your pee clear to pale yellow  If you think you are getting another bladder infection, start drinking cranberry juice and come see [place] so we can check the urine.  If you did have strep exposure, the keflex will cover it.  Please schedule a Follow-up Appointment to: Return in about 1 week (around 09/16/2017), or if symptoms worsen or fail to improve.  If you have any other questions or concerns, please feel free to call the office or send a message through [place]. You may also schedule an earlier appointment if necessary.  Additionally, you may be receiving a survey about your experience at our office within a few days to 1 week by e-mail or mail. We value your feedback.  Nobie Putnam, DO [place]

## 2017-09-09 NOTE — Progress Notes (Signed)
Subjective:    Patient ID: Leah Osborne, female    DOB: 08-30-1985, 32 y.o.   MRN: [id number]  Leah Osborne is a 32 y.o. female presenting on 09/09/2017 for Urinary Tract Infection (onset 3 days burns, itchy and noticed blood)  Patient presents for a same day appointment.  HPI   UTI - Reports concern for UTI now with dysuria and some itching for past 3 days also with some noticed blood or hematuria. She was unsure exactly where the bleeding was coming from, not due to menstrual cycle since she is on IUD. This feels similar to prior UTI in past, last treated 11/2016 in ED for UTI with Bactrim, previous urine culture confirmed UTI in office 2017, she was treated with Cipro at that time, she has no antibiotic allergies. - No history of nephrolithiasis before - Denies any flank or back pain, nausea vomiting, fever chills  Additional history she had recent strep throat exposure from children, she has no sore throat or symptoms, but wanted to ask if her antibiotic could cover this as well just in case  Depression screen [facility] 2/9 05/28/2016 01/23/2016  Decreased Interest 0 2  Down, Depressed, Hopeless 0 2  PHQ - 2 Score 0 4  Altered sleeping 0 3  Tired, decreased energy 3 3  Change in appetite 0 3  Feeling bad or failure about yourself  0 2  Trouble concentrating 0 0  Moving slowly or fidgety/restless 1 0  Suicidal thoughts 0 1  PHQ-9 Score 4 16  Difficult doing work/chores - Extremely dIfficult    Social History   Tobacco Use  . Smoking status: Never Smoker  . Smokeless tobacco: Never Used  Substance Use Topics  . Alcohol use: No    Alcohol/week: 0.0 oz  . Drug use: No    Review of Systems Per HPI unless specifically indicated above     Objective:    BP 120/77   Pulse 86   Temp 98.6 F (37 C) (Oral)   Resp 16   Ht 5' (1.524 m)   Wt 296 lb (134.3 kg)   BMI 57.81 kg/m   Wt Readings from Last 3 Encounters:  09/09/17 296 lb (134.3 kg)  07/25/17 291 lb (132  kg)  07/22/17 296 lb (134.3 kg)    Physical Exam  Constitutional: She is oriented to person, place, and time. She appears well-developed and well-nourished. No distress.  Well-appearing, comfortable, cooperative  HENT:  Head: Normocephalic and atraumatic.  Mouth/Throat: Oropharynx is clear and moist.  Eyes: Conjunctivae are normal. Right eye exhibits no discharge. Left eye exhibits no discharge.  Cardiovascular: Normal rate.  Pulmonary/Chest: Effort normal.  Abdominal: Soft. Bowel sounds are normal. She exhibits no distension. There is no tenderness.  Musculoskeletal: She exhibits no edema.  Neurological: She is alert and oriented to person, place, and time.  Skin: Skin is warm and dry. No rash noted. She is not diaphoretic. No erythema.  Psychiatric: She has a normal mood and affect. Her behavior is normal.  Nursing note and vitals reviewed.  Results for orders placed or performed in visit on 09/09/17  POCT Urinalysis Dipstick  Result Value Ref Range   Color, UA yellow    Clarity, UA     Glucose, UA negative    Bilirubin, UA negative    Ketones, UA negative    Spec Grav, UA 1.010 1.010 - 1.025   Blood, UA trace    pH, UA 5.0 5.0 - 8.0  Protein, UA negative    Urobilinogen, UA 0.2 0.2 or 1.0 E.U./dL   Nitrite, UA negative    Leukocytes, UA Large (3+) (A) Negative   Appearance     Odor        Assessment & Plan:   Problem List Items Addressed This Visit    None    Visit Diagnoses    Urinary tract infection with hematuria, site unspecified    -  Primary   Relevant Medications   cephALEXin (KEFLEX) 500 MG capsule   Other Relevant Orders   POCT Urinalysis Dipstick (Completed)   CULTURE, URINE COMPREHENSIVE      Clinically consistent with UTI and confirmed on UA. No recent UTIs or abx courses. No known chronic or recurrent UTI. No concern for pyelo today (no systemic symptoms, neg fever, back pain, n/v).  Plan: 1. UA / micro - neg nitrite, large +3 leuks 2. Ordered  Urine culture 3. Keflex 500mg  TID x 7 days - will also cover empiric strep exposure as well. 4. Improve PO hydration 5. RTC if no improvement 1-2 weeks, red flags given to return sooner   Meds ordered this encounter  Medications  . cephALEXin (KEFLEX) 500 MG capsule    Sig: Take 1 capsule (500 mg total) by mouth 3 (three) times daily. For 7 days    Dispense:  21 capsule    Refill:  0    Follow up plan: Return in about 1 week (around 09/16/2017), or if symptoms worsen or fail to improve, for UTI.  Nobie Putnam, [facility] 09/09/2017, 12:21 PM

## 2017-09-10 LAB — CULTURE, URINE COMPREHENSIVE
MICRO NUMBER: 90371233
SPECIMEN QUALITY:: ADEQUATE

## 2017-09-12 DIAGNOSIS — G4733 Obstructive sleep apnea (adult) (pediatric): Secondary | ICD-10-CM | POA: Diagnosis not present

## 2017-09-24 ENCOUNTER — Encounter: Payer: Self-pay | Admitting: Family Medicine

## 2017-09-24 DIAGNOSIS — R491 Aphonia: Secondary | ICD-10-CM

## 2017-09-24 DIAGNOSIS — R490 Dysphonia: Secondary | ICD-10-CM

## 2017-09-26 NOTE — Addendum Note (Signed)
Addended by: Olin Hauser on: 09/26/2017 01:29 PM   Modules accepted: Orders

## 2017-10-06 NOTE — Progress Notes (Signed)
Cardiology Office Note  Date:  10/08/2017   ID:  Leah Osborne, DOB 11-20-1985, MRN [id number]  PCP:  Olin Hauser, DO   Chief Complaint  Patient presents with  . other    C/o heart palpitations and rapid heart Rate. Meds reviewed verbally with pt.    HPI:  Ms. Leah Osborne is a 32 year old woman with past medical history of HTN Prior hx of suicidal ideation migraine headaches, hypothyroidism, depression and anxiety Morbid obesity Flu in 07/2017 intermittent palpitations,  OSA, not on cpap suspected to be related to anxiety, hypothyroidism, caffeine,  who presents by referral from Per Dr. 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  PSH:    Past Surgical History:  Procedure Laterality Date  . CHOLECYSTECTOMY    . LAPAROSCOPIC TUBAL LIGATION Bilateral 09/19/2015   Procedure: LAPAROSCOPIC BILATERAL TUBAL BANDING;  Surgeon: Brayton Mars, MD;  Location: [facility] ORS;  Service: Gynecology;  Laterality: Bilateral;    Current Outpatient Medications  Medication Sig Dispense Refill  . levothyroxine (SYNTHROID, LEVOTHROID) 25 MCG tablet Take 1 tablet (25 mcg total) by mouth daily before breakfast. 90 tablet 3   No current facility-administered medications for this visit.      Allergies:   Patient has no known allergies.   Social History:  The  patient  reports that she has never smoked. She has never used smokeless tobacco. She reports that she drinks alcohol. She reports that she does not use drugs.   Family History:   family history includes Atrial fibrillation in her paternal grandmother; Diabetes in her maternal grandmother; Healthy in her father; Heart Problems in her maternal grandfather; Heart attack in her maternal grandmother; Heart disease in her maternal grandmother; Heart failure in her maternal grandmother; Rheum arthritis in her mother.    Review of Systems: Review of Systems  Constitutional: Negative.   Respiratory: Negative.   Cardiovascular: Positive for palpitations.       Irregular rhythm  Gastrointestinal: Negative.   Musculoskeletal: Negative.   Neurological: Negative.   Psychiatric/Behavioral: Negative.   All other systems reviewed and are negative.    PHYSICAL EXAM: VS:  BP 115/81 (BP Location: Right Arm, Patient Position: Sitting, Cuff Size: Large)   Pulse 80   Ht 5\' 1"  (1.549 m)   Wt 297 lb (134.7 kg)   BMI 56.12 kg/m  , BMI Body mass index is 56.12 kg/m. GEN: Well nourished, well developed, in no acute distress , obese HEENT: normal  Neck: no JVD, carotid bruits, or masses Cardiac: RRR; no murmurs, rubs, or gallops,no edema  Respiratory:  clear to auscultation bilaterally, normal work of breathing GI: soft, nontender, nondistended, + BS MS: no deformity or atrophy  Skin: warm and dry, no rash Neuro:  Strength and sensation are intact Psych: euthymic mood, full affect    Recent Labs: 07/14/2017: ALT 20 08/06/2017: BUN 10; Creat 0.63; Hemoglobin  12.3; Platelets 412; Potassium 4.1; Sodium 138; TSH 2.32    Lipid Panel Lab Results  Component Value Date   CHOL 164 10/24/2016   HDL 35 (L) 10/24/2016   LDLCALC 106 (H) 10/24/2016   TRIG 117 10/24/2016      Wt Readings from Last 3 Encounters:  10/08/17 297 lb (134.7 kg)  09/09/17 296 lb (134.3 kg)  07/25/17 291 lb (132 kg)        ASSESSMENT AND PLAN:  Paroxysmal atrial tachycardia (HCC) Reports having tachycardia daily Rate up to 120 bpm She is concerned about atrial fibrillation, family member has this Also having PVCs We did talk about beta-blockers, she is reluctant to start any medication without knowing what she has Recommended we order event monitor for her arrhythmia We will call her with the results and discussed various medication options available   hypertension Blood pressure is well controlled on today's visit. No changes made to the medications.  Mixed hyperlipidemia Cholesterol numbers are reasonable Recommended weight loss  Palpitations Having PVCs at home,   Morbid obesity ([name]) We have encouraged continued exercise, careful diet management in an effort to lose weight.  Disposition:   We will call her with the results of her event monitor  Patient was seen in consultation for Dr. Parks Ranger and will be referred back to his office for ongoing care of the issues detailed above     Total encounter time more than 60 minutes  Greater than 50% was spent in counseling and coordination of care with the patient   No orders of the defined types were placed in this encounter.    Signed, Esmond Plants, M.D., Ph.D. 10/08/2017  [place], [place]

## 2017-10-08 ENCOUNTER — Ambulatory Visit (INDEPENDENT_AMBULATORY_CARE_PROVIDER_SITE_OTHER): Payer: BLUE CROSS/BLUE SHIELD

## 2017-10-08 ENCOUNTER — Ambulatory Visit: Payer: BLUE CROSS/BLUE SHIELD | Admitting: Cardiovascular Disease

## 2017-10-08 ENCOUNTER — Encounter: Payer: Self-pay | Admitting: Cardiovascular Disease

## 2017-10-08 VITALS — BP 115/81 | HR 80 | Ht 61.0 in | Wt 297.0 lb

## 2017-10-08 DIAGNOSIS — R002 Palpitations: Secondary | ICD-10-CM | POA: Diagnosis not present

## 2017-10-08 DIAGNOSIS — I471 Supraventricular tachycardia: Secondary | ICD-10-CM

## 2017-10-08 DIAGNOSIS — I10 Essential (primary) hypertension: Secondary | ICD-10-CM

## 2017-10-08 DIAGNOSIS — E782 Mixed hyperlipidemia: Secondary | ICD-10-CM | POA: Diagnosis not present

## 2017-10-08 NOTE — Addendum Note (Signed)
Addended by: Britt Bottom on: 10/08/2017 02:20 PM   Modules accepted: Orders

## 2017-10-08 NOTE — Patient Instructions (Signed)
Medication Instructions:   No medication changes made  Labwork:  No new labs needed  Testing/Procedures:  We will order a zio monitor for palpitations, irregular rhythm   Follow-Up: It was a pleasure seeing you in the office today. Please call us if you have new issues that need to be addressed before your next appt.  [phone]  Your physician wants you to follow-up in: as needed  If you need a refill on your cardiac medications before your next appointment, please call your pharmacy.  For educational health videos Log in to : [email or web address] Or : [email or web address], password : [email or web address]

## 2017-10-08 NOTE — Addendum Note (Signed)
Addended by: Valora Corporal on: 10/08/2017 04:30 PM   Modules accepted: Orders

## 2017-10-13 DIAGNOSIS — G4733 Obstructive sleep apnea (adult) (pediatric): Secondary | ICD-10-CM | POA: Diagnosis not present

## 2017-10-15 ENCOUNTER — Telehealth: Payer: Self-pay | Admitting: Cardiovascular Disease

## 2017-10-15 ENCOUNTER — Encounter: Payer: Self-pay | Admitting: Cardiovascular Disease

## 2017-10-15 MED ORDER — METOPROLOL TARTRATE 25 MG PO TABS: 12.5000 mg | ORAL_TABLET | Freq: Two times a day (BID) | ORAL | 3 refills | Status: DC

## 2017-10-15 NOTE — Telephone Encounter (Signed)
Call transferred directly to triage. The patient is currently at work, but states she has been having a fluttering in her heart today that is irregular and pounding since ~ 12pm. She is currently wearing a ZIO monitor and did push her the button to mark her symptoms around noon today. She has had 1 episode of diarrhea today that occurred after her palpitations started.  She states she is well hydrated.  She has not checked her BP/ HR's today.  The symptoms have eased off some, but she states she is still having these.  She complains of SOB.   I advised the patient that since we will not know what the monitor is showing until she returns it, we are limited in knowing what his actually going on. I inquired if she would be agreeable to trying a beta blocker if Dr. Rockey Situ would send this in for her and she is now agreeable- she did not want to do this at her office visit.   She states she works until 10 pm tonight and has to be back at work at 9 am in the morning.  She is unsure that she will actually be able to pick up a RX. I advised her that if this is bothering her enough she may need to go to the ER/ Urgent Care.  She is aware I will call her back after I review with Dr. Rockey Situ.   She would like me to leave a message on her cell # if she does not answer [phone].  [facility] in [place].

## 2017-10-15 NOTE — Telephone Encounter (Signed)
Attempted to connect with triage nurse.  Patient aware some one will call her back

## 2017-10-15 NOTE — Telephone Encounter (Signed)
Pt c/o of Chest Pain: STAT if CP now or developed within 24 hours  1. Are you having CP right now? Pressure   2. Are you experiencing any other symptoms (ex. SOB, nausea, vomiting, sweating)? Sob sweating diarhea   3. How long have you been experiencing CP? 2 hrs   4. Is your CP continuous or coming and going? Continuous   5. Have you taken Nitroglycerin? No  ?

## 2017-10-15 NOTE — Telephone Encounter (Signed)
I left a message for the patient on her voice mail that I have reviewed her symptoms with Dr. Rockey Situ. Orders received that she can try metoprolol tartrate 25 mg- will start with 1/2 tablet (12.5 mg) by mouth TWICE daily. I advised her [facility] [place]. I have also advised that aside from treating her with medications at this time, we will need to get her monitor report back to see if there is anything else with her rhythm that is showing up.  I asked that she call back with any further questions or concerns, and that she check her BP/ HR at the pharmacy and record these readings.

## 2017-10-17 DIAGNOSIS — K219 Gastro-esophageal reflux disease without esophagitis: Secondary | ICD-10-CM | POA: Diagnosis not present

## 2017-10-17 DIAGNOSIS — H6123 Impacted cerumen, bilateral: Secondary | ICD-10-CM | POA: Diagnosis not present

## 2017-10-17 DIAGNOSIS — J3501 Chronic tonsillitis: Secondary | ICD-10-CM | POA: Diagnosis not present

## 2017-10-23 ENCOUNTER — Encounter: Payer: Self-pay | Admitting: Family Medicine

## 2017-10-23 ENCOUNTER — Ambulatory Visit: Payer: BLUE CROSS/BLUE SHIELD | Admitting: Family Medicine

## 2017-10-23 VITALS — BP 124/86 | HR 82 | Temp 98.0°F | Resp 16 | Ht 61.0 in | Wt 298.0 lb

## 2017-10-23 DIAGNOSIS — T3695XA Adverse effect of unspecified systemic antibiotic, initial encounter: Secondary | ICD-10-CM

## 2017-10-23 DIAGNOSIS — N3001 Acute cystitis with hematuria: Secondary | ICD-10-CM | POA: Diagnosis not present

## 2017-10-23 DIAGNOSIS — B379 Candidiasis, unspecified: Secondary | ICD-10-CM | POA: Diagnosis not present

## 2017-10-23 LAB — POCT URINALYSIS DIPSTICK
Bilirubin, UA: NEGATIVE
Glucose, UA: NEGATIVE
Ketones, UA: NEGATIVE
Leukocytes, UA: NEGATIVE
Nitrite, UA: NEGATIVE
Protein, UA: NEGATIVE
Spec Grav, UA: 1.01 (ref 1.010–1.025)
Urobilinogen, UA: 0.2 E.U./dL
pH, UA: 5 (ref 5.0–8.0)

## 2017-10-23 MED ORDER — CIPROFLOXACIN HCL 250 MG PO TABS: 250.0000 mg | ORAL_TABLET | Freq: Two times a day (BID) | ORAL | 0 refills | Status: DC

## 2017-10-23 MED ORDER — FLUCONAZOLE 150 MG PO TABS: ORAL_TABLET | ORAL | 0 refills | Status: DC

## 2017-10-23 NOTE — Patient Instructions (Addendum)
Thank you for coming to the office today.  1. You have a Urinary Tract Infection - this is very common, your symptoms are reassuring and you should get better within 1 week on the antibiotics - Start Cipro 250mg  2 times daily for next 5 days, complete entire course, even if feeling better - We sent urine for a culture, we will call you within next few days if we need to change antibiotics - Please drink plenty of fluids, improve hydration over next 1 week  If symptoms worsening, developing nausea / vomiting, worsening back pain, fevers / chills / sweats, then please return for re-evaluation sooner.  To prevent bladder and kidney infections...  1. Pee within 30 minutes after sex  2. Wipe front to back after using the restroom  3. Drink enough water to keep your pee clear to pale yellow  If you think you are getting another bladder infection, start drinking cranberry juice and come see [place] so we can check the urine.  Also sent Diflucan for yeast - take AFTER Cipro is finished follow instructions  If recurrent or not improved after treatment - we may consider referral to Urology  Call Dr Richardson Landry office [facility] - ask about which medication they were going to send - and if they can, if not let us know, we need to know name, dose, 1 or 2 times daily and duration - and I can prescribe it   Please schedule a Follow-up Appointment to: Return in about 1 week (around 10/30/2017), or if symptoms worsen or fail to improve, for UTI.  If you have any other questions or concerns, please feel free to call the office or send a message through [place]. You may also schedule an earlier appointment if necessary.  Additionally, you may be receiving a survey about your experience at our office within a few days to 1 week by e-mail or mail. We value your feedback.  Nobie Putnam, DO [place]

## 2017-10-23 NOTE — Progress Notes (Signed)
Subjective:    Patient ID: Leah Osborne, female    DOB: 1985/07/22, 32 y.o.   MRN: [id number]  Leah Osborne is a 32 y.o. female presenting on 10/23/2017 for Urinary Tract Infection (onset yesterday)   HPI   UTI / Dysuria Last seen for similar issue UTI in past 6 weeks, see office note for background from 09/09/17, treated with keflex and urine culture had non specific growth. - Now back again today with recurrence, recently her UTi symptoms had completely resolved on antibiotic before, did not require yeast medication at that time - Now onset x past 24 hours with pain again pelvic with urinary only, difficult to describe if dysuria or burning - Admits again she was unsure exactly where the bleeding was coming from, not due to menstrual cycle since she is on IUD. This feels similar to prior UTI in past, other treatments in past 2 years with Bactrim and Cipro with good results - urine is yellow, not clear, and no gross hematuria - No history of nephrolithiasis before - Denies any flank or back pain, nausea vomiting, fever chills  Additional updates: - [facility] - Dr Richardson Landry - she was seen by them recently within past month with history of voice loss episodic, and was recommended that she does have some "jagged" tissue within larynx and this could be result of laryngopharyngeal reflux LPR, they recommended stomach acid medicine but she says have not sent rx, asking if we can send it. I do not have record available faxed from ENT at this time.  Palpitations / PVCs / Paroxysmal Atrial Tachycardia Previously followed in past for issues of palpitations, most recently has been advised to return to Cardiology [facility] Dr Rockey Situ, she saw him on 10/08/17, ultimately she had EKG and eventually longer term heart monitor placed, identified frequent PVCs and tachycardia episodes, she was given rx Metoprolol 25mg  (half tab for 12.5mg ) BID with significant improvement now states it is controlling her  symptoms. She has quit drinking sodas, does drink some sweet tea 1 cup daily, some water   Social History   Tobacco Use  . Smoking status: Never Smoker  . Smokeless tobacco: Never Used  Substance Use Topics  . Alcohol use: Yes    Alcohol/week: 0.0 oz    Comment: occassional  . Drug use: No    Review of Systems Per HPI unless specifically indicated above     Objective:    BP 124/86 (BP Location: Left Arm, Cuff Size: Large)   Pulse 82   Temp 98 F (36.7 C) (Oral)   Resp 16   Ht 5\' 1"  (1.549 m)   Wt 298 lb (135.2 kg)   BMI 56.31 kg/m   Wt Readings from Last 3 Encounters:  10/23/17 298 lb (135.2 kg)  10/08/17 297 lb (134.7 kg)  09/09/17 296 lb (134.3 kg)    Physical Exam  Constitutional: She is oriented to person, place, and time. She appears well-developed and well-nourished. No distress.  Well-appearing, comfortable, cooperative, morbid obese  HENT:  Head: Normocephalic and atraumatic.  Mouth/Throat: Oropharynx is clear and moist.  Eyes: Conjunctivae are normal. Right eye exhibits no discharge. Left eye exhibits no discharge.  Cardiovascular: Normal rate.  Pulmonary/Chest: Effort normal.  Abdominal: Soft. Bowel sounds are normal. She exhibits no distension. There is no tenderness.  Musculoskeletal: She exhibits no edema.  Neurological: She is alert and oriented to person, place, and time.  Skin: Skin is warm and dry. No rash noted. She is  not diaphoretic. No erythema.  Psychiatric: She has a normal mood and affect. Her behavior is normal.  Nursing note and vitals reviewed.  Results for orders placed or performed in visit on 10/23/17  POCT Urinalysis Dipstick  Result Value Ref Range   Color, UA amber    Clarity, UA clear    Glucose, UA negative    Bilirubin, UA negative    Ketones, UA negative    Spec Grav, UA 1.010 1.010 - 1.025   Blood, UA trace    pH, UA 5.0 5.0 - 8.0   Protein, UA negative    Urobilinogen, UA 0.2 0.2 or 1.0 E.U./dL   Nitrite, UA  negative    Leukocytes, UA Negative Negative   Appearance clear    Odor none       Assessment & Plan:   Problem List Items Addressed This Visit    None    Visit Diagnoses    Acute cystitis with hematuria    -  Primary   Relevant Medications   ciprofloxacin (CIPRO) 250 MG tablet   Other Relevant Orders   POCT Urinalysis Dipstick (Completed)   Urine Culture   Antibiotic-induced yeast infection       Relevant Medications   fluconazole (DIFLUCAN) 150 MG tablet      Clinically consistent with again possible UTI, UA is not entirely suggestive, concern recurrent episodes similar, possible yeast or other etiology, could be more menstrual or other GYN related. Did resolve last time with antibiotics after few days for UTI No concern for pyelo today (no systemic symptoms, neg fever, back pain, n/v).  Plan: 1. UA collected and reviewed 2. Ordered Urine culture 3. Switch to alternative antibiotic today - Cipro 250mg  BID x 5 days - reviewed black box warning potential risk ligament damage - she request antibiotic empirically before result of urine culture - Also sent rx Diflucan for yeast after finish Cipro if needed 4. Improve PO hydration - more water, less tea 5. RTC if no improvement 1-2 weeks, red flags given to return sooner - may need Urology in future with recurrence  -------------- #LPR/reflux/hoarse Waiting on faxed record from [facility] Dr Richardson Landry Agree with their recs for PPI - unsure exactly what dose and duration they request, she should call them and ask for rx or recommendations, if needed she can tell us the details and we can send rx Follow-up  #palpitations/PVC/tachycardia Currently seems controlled on BB, metoprolol low dose, normal HR now Limit caffeine Follow-up with Cardiology as planned  Meds ordered this encounter  Medications  . ciprofloxacin (CIPRO) 250 MG tablet    Sig: Take 1 tablet (250 mg total) by mouth 2 (two) times daily. For 5 days     Dispense:  10 tablet    Refill:  0  . fluconazole (DIFLUCAN) 150 MG tablet    Sig: Take one tablet by mouth on Day 1. Repeat dose 2nd tablet on Day 3.    Dispense:  2 tablet    Refill:  0    Follow up plan: Return in about 1 week (around 10/30/2017), or if symptoms worsen or fail to improve, for UTI.  Nobie Putnam, [facility] 10/23/2017, 1:07 PM

## 2017-10-24 LAB — URINE CULTURE
MICRO NUMBER:: 90563810
Result:: NO GROWTH
SPECIMEN QUALITY:: ADEQUATE

## 2017-12-14 ENCOUNTER — Encounter: Payer: Self-pay | Admitting: Emergency Medicine

## 2017-12-14 ENCOUNTER — Emergency Department
Admission: EM | Admit: 2017-12-14 | Discharge: 2017-12-14 | Disposition: A | Discharge status: Home / Self Care | Payer: BLUE CROSS/BLUE SHIELD | Attending: Emergency Medicine | Admitting: Emergency Medicine

## 2017-12-14 DIAGNOSIS — E039 Hypothyroidism, unspecified: Secondary | ICD-10-CM | POA: Diagnosis not present

## 2017-12-14 DIAGNOSIS — F319 Bipolar disorder, unspecified: Secondary | ICD-10-CM | POA: Insufficient documentation

## 2017-12-14 DIAGNOSIS — M461 Sacroiliitis, not elsewhere classified: Secondary | ICD-10-CM

## 2017-12-14 DIAGNOSIS — Z79899 Other long term (current) drug therapy: Secondary | ICD-10-CM | POA: Diagnosis not present

## 2017-12-14 DIAGNOSIS — F419 Anxiety disorder, unspecified: Secondary | ICD-10-CM | POA: Diagnosis not present

## 2017-12-14 DIAGNOSIS — I10 Essential (primary) hypertension: Secondary | ICD-10-CM | POA: Insufficient documentation

## 2017-12-14 DIAGNOSIS — Z9049 Acquired absence of other specified parts of digestive tract: Secondary | ICD-10-CM | POA: Insufficient documentation

## 2017-12-14 DIAGNOSIS — M545 Low back pain: Secondary | ICD-10-CM | POA: Diagnosis not present

## 2017-12-14 HISTORY — DX: Ventricular premature depolarization: I49.3

## 2017-12-14 HISTORY — DX: Unspecified osteoarthritis, unspecified site: M19.90

## 2017-12-14 MED ORDER — METAXALONE 800 MG PO TABS: 800.0000 mg | ORAL_TABLET | Freq: Three times a day (TID) | ORAL | 0 refills | Status: AC

## 2017-12-14 MED ORDER — DICLOFENAC SODIUM 50 MG PO TBEC: 50.0000 mg | DELAYED_RELEASE_TABLET | Freq: Two times a day (BID) | ORAL | 1 refills | Status: AC

## 2017-12-14 MED ORDER — DICLOFENAC SODIUM 25 MG PO TBEC
50.0000 mg | DELAYED_RELEASE_TABLET | Freq: Once | ORAL | Status: AC
  Administered 2017-12-14: 50 mg via ORAL
  Filled 2017-12-14: qty 2

## 2017-12-14 NOTE — ED Notes (Signed)
Pharmacy called for med  

## 2017-12-14 NOTE — ED Provider Notes (Addendum)
[facility] Emergency Department Provider Note ____________________________________________  Time seen: 2027  I have reviewed the triage vital signs and the nursing notes.  HISTORY  Chief Complaint  Back Pain and Hip Pain  HPI Leah Osborne is a 32 y.o. female presents herself to the ED for evaluation of low back pain.  Patient with a history of rheumatoid arthritis and sacroiliitis, presents with worsening of her baseline pain patient denies any injury, accident, trauma, fall.  She also denies any distal paresthesias, footdrop, or incontinence.  Patient normally takes over-the-counter ibuprofen and Tylenol for symptom relief.  She denies any significant benefit with the symptoms.  She has previously been diagnosed with conditions ranging from osteitis condensans ilii to ankylosing spondylitis, all in reference to her chronic lumbar sacral pain.  Denies any prescription medications for her symptom relief.  Past Medical History:  Diagnosis Date  . Abnormal Pap smear of cervix   . Arthritis   . Bipolar 1 disorder ([place])   . Headache    MIGRAINES  . Hyperlipidemia   . Hypertension   . PTSD (post-traumatic stress disorder)   . PVC (premature ventricular contraction)   . Social phobia     Patient Active Problem List   Diagnosis Date Noted  . Abnormal skin growth 11/15/2016  . PTSD (post-traumatic stress disorder) 10/24/2016  . Severe bipolar I disorder, most recent episode depressed without psychotic features ([place]) 10/23/2016  . Ankylosing spondylitis ([place]) 10/23/2016  . Sacroiliac inflammation ([place]) 08/02/2016  . Hypertension 05/28/2016  . Vitamin D deficiency 05/02/2016  . 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 Hypothyroidism 02/13/2016  . Anxiety and depression 01/23/2016  . Status post tubal ligation 09/22/2015  . Morbid (severe) obesity due to excess calories ([place]) 04/25/2015  . Anemia, iron deficiency 01/04/2015   . Headache, migraine 01/04/2015  . Abnormal cytological findings in female genital organs 07/04/2011    Past Surgical History:  Procedure Laterality Date  . CHOLECYSTECTOMY    . LAPAROSCOPIC TUBAL LIGATION Bilateral 09/19/2015   Procedure: LAPAROSCOPIC BILATERAL TUBAL BANDING;  Surgeon: Brayton Mars, MD;  Location: [facility] ORS;  Service: Gynecology;  Laterality: Bilateral;    Prior to Admission medications   Medication Sig Start Date End Date Taking? Authorizing Provider  omeprazole (PRILOSEC) 40 MG capsule Take 40 mg by mouth daily.   Yes [provider]  ciprofloxacin (CIPRO) 250 MG tablet Take 1 tablet (250 mg total) by mouth 2 (two) times daily. For 5 days 10/23/17   Olin Hauser, DO  diclofenac (VOLTAREN) 50 MG EC tablet Take 1 tablet (50 mg total) by mouth 2 (two) times daily. 12/14/17 01/13/18  Adiya Selmer, Dannielle Karvonen, PA-C  fluconazole (DIFLUCAN) 150 MG tablet Take one tablet by mouth on Day 1. Repeat dose 2nd tablet on Day 3. 10/23/17   Olin Hauser, DO  levothyroxine (SYNTHROID, LEVOTHROID) 25 MCG tablet Take 1 tablet (25 mcg total) by mouth daily before breakfast. 07/22/17   Parks Ranger, Devonne Doughty, DO  metaxalone (SKELAXIN) 800 MG tablet Take 1 tablet (800 mg total) by mouth 3 (three) times daily for 15 days. 12/14/17 12/29/17  Nasean Zapf, Dannielle Karvonen, PA-C  metoprolol tartrate (LOPRESSOR) 25 MG tablet Take 0.5 tablets (12.5 mg total) by mouth 2 (two) times daily. 10/15/17 01/13/18  Minna Merritts, MD    Allergies Patient has no known allergies.  Family History  Problem Relation Age of Onset  . Diabetes Maternal Grandmother   . Heart disease  Maternal Grandmother   . Heart failure Maternal Grandmother   . Heart attack Maternal Grandmother   . Rheum arthritis Mother   . Healthy Father   . Heart Problems Maternal Grandfather   . Atrial fibrillation Paternal Grandmother   . Cancer Neg Hx     Social History Social History   Tobacco Use  .  Smoking status: Never Smoker  . Smokeless tobacco: Never Used  Substance Use Topics  . Alcohol use: Not Currently    Alcohol/week: 0.0 oz  . Drug use: No    Review of Systems  Constitutional: Negative for fever. Cardiovascular: Negative for chest pain. Respiratory: Negative for shortness of breath. Gastrointestinal: Negative for abdominal pain, vomiting and diarrhea. Genitourinary: Negative for dysuria. Musculoskeletal: Positive for back pain. Skin: Negative for rash. Neurological: Negative for headaches, focal weakness or numbness. ____________________________________________  PHYSICAL EXAM:  VITAL SIGNS: ED Triage Vitals  Enc Vitals Group     BP 12/14/17 2005 137/81     Pulse Rate 12/14/17 2005 67     Resp 12/14/17 2005 18     Temp 12/14/17 2005 98.2 F (36.8 C)     Temp Source 12/14/17 2005 Oral     SpO2 12/14/17 2005 99 %     Weight 12/14/17 2006 296 lb (134.3 kg)     Height 12/14/17 2006 5\' 1"  (1.549 m)     Head Circumference --      Peak Flow --      Pain Score 12/14/17 2005 7     Pain Loc --      Pain Edu? --      Excl. in [place]? --     Constitutional: Alert and oriented. Well appearing and in no distress. Head: Normocephalic and atraumatic. Cardiovascular: Normal rate, regular rhythm. Normal distal pulses. Respiratory: Normal respiratory effort. No wheezes/rales/rhonchi. Musculoskeletal: Normal spinal alignment without midline tenderness, spasm, deformity, or step-off.  Patient is mildly tender to palpation over the bilateral SI joints.  Nontender with normal range of motion in all extremities.  Neurologic:  Normal gait without ataxia. Normal speech and language. No gross focal neurologic deficits are appreciated. Skin:  Skin is warm, dry and intact. No rash noted. Psychiatric: Mood and affect are normal. Patient exhibits appropriate insight and judgment. ____________________________________________  PROCEDURES  Procedures Diclofenac 50 mg  PO ____________________________________________  INITIAL IMPRESSION / ASSESSMENT AND PLAN / ED COURSE  Patient with ED evaluation of acute on chronic flare of her low back pain secondary to sacroiliitis.  Patient exam is remains benign.  Symptoms likely represent a flare of her sacroiliac degenerative disease.  She will be discharged with a prescription for diclofenac and Skelaxin to dose as directed.  She is encouraged to follow with the primary provider or her rheumatologist for ongoing management.  Return precautions have been reviewed. ____________________________________________  FINAL CLINICAL IMPRESSION(S) / ED DIAGNOSES  Final diagnoses:  Sacroiliitis ([place])      Briany Aye, Dannielle Karvonen, PA-C 12/14/17 2219    Melvenia Needles, PA-C 12/14/17 [place], [place], MD 12/15/17 1505

## 2017-12-14 NOTE — ED Triage Notes (Signed)
Patient ambulatory to triage without difficulty. Patient with complaint of bilateral lower back pain radiating to bilateral hips that started on Wednesday. Patient states that she has rheumatoid arthritis and thinks that the pain maybe from that. Patient states that she has taken IBU and BC powder with no relief.

## 2017-12-14 NOTE — Discharge Instructions (Addendum)
Your symptoms appear to be consistent with a flare of your sacroiliitis. Take the prescription meds as directed. Follow-up with your PCP or rheumatology as discussed.

## 2017-12-25 ENCOUNTER — Ambulatory Visit: Admit: 2017-12-25 | Discharge: 2017-12-26 | Payer: MEDICAID

## 2017-12-25 DIAGNOSIS — M461 Sacroiliitis, not elsewhere classified: Principal | ICD-10-CM

## 2017-12-25 DIAGNOSIS — Z6841 Body Mass Index (BMI) 40.0 and over, adult: Secondary | ICD-10-CM | POA: Diagnosis not present

## 2017-12-25 DIAGNOSIS — E669 Obesity, unspecified: Secondary | ICD-10-CM | POA: Diagnosis not present

## 2017-12-25 DIAGNOSIS — F319 Bipolar disorder, unspecified: Secondary | ICD-10-CM | POA: Diagnosis not present

## 2017-12-25 DIAGNOSIS — M545 Low back pain: Secondary | ICD-10-CM | POA: Diagnosis not present

## 2017-12-25 DIAGNOSIS — I10 Essential (primary) hypertension: Secondary | ICD-10-CM | POA: Diagnosis not present

## 2017-12-25 DIAGNOSIS — M459 Ankylosing spondylitis of unspecified sites in spine: Secondary | ICD-10-CM | POA: Diagnosis not present

## 2017-12-25 DIAGNOSIS — Z79899 Other long term (current) drug therapy: Secondary | ICD-10-CM | POA: Diagnosis not present

## 2017-12-31 ENCOUNTER — Encounter: Payer: Self-pay | Admitting: Cardiovascular Disease

## 2017-12-31 ENCOUNTER — Ambulatory Visit: Admit: 2017-12-31 | Discharge: 2018-01-01 | Payer: MEDICAID

## 2017-12-31 DIAGNOSIS — M461 Sacroiliitis, not elsewhere classified: Principal | ICD-10-CM

## 2017-12-31 DIAGNOSIS — M545 Low back pain: Secondary | ICD-10-CM | POA: Diagnosis not present

## 2017-12-31 DIAGNOSIS — Z975 Presence of (intrauterine) contraceptive device: Secondary | ICD-10-CM | POA: Diagnosis not present

## 2018-01-01 ENCOUNTER — Encounter: Payer: Self-pay | Admitting: Family Medicine

## 2018-01-01 MED ORDER — CELECOXIB 100 MG CAPSULE
ORAL_CAPSULE | Freq: Two times a day (BID) | ORAL | 5 refills | 0 days | Status: CP
Start: 2018-01-01 — End: 2018-05-12

## 2018-01-06 ENCOUNTER — Ambulatory Visit (INDEPENDENT_AMBULATORY_CARE_PROVIDER_SITE_OTHER): Payer: BLUE CROSS/BLUE SHIELD | Admitting: *Deleted

## 2018-01-06 VITALS — BP 120/60 | HR 67 | Ht 61.0 in | Wt 301.0 lb

## 2018-01-06 DIAGNOSIS — I471 Supraventricular tachycardia: Secondary | ICD-10-CM

## 2018-01-06 DIAGNOSIS — R002 Palpitations: Secondary | ICD-10-CM

## 2018-01-06 DIAGNOSIS — I4719 Other supraventricular tachycardia: Secondary | ICD-10-CM

## 2018-01-06 MED ORDER — METOPROLOL TARTRATE 25 MG PO TABS: 25.0000 mg | ORAL_TABLET | Freq: Two times a day (BID) | ORAL | 3 refills | Status: DC

## 2018-01-06 NOTE — Progress Notes (Signed)
1.) Reason for visit: Palpitations  2.) Name of MD requesting visit: Dr. Rockey Situ  3.) H&P: HTN, Palpitations, Atrial tachycardia  4.) ROS related to problem: Patient reports increase in palpitations which have her concerned. **  5.) Assessment and plan per MD: Reviewed EKG and vitals with Dr. Rockey Situ and verbal orders received to increase metoprolol to whole pill twice daily. Patient also brought in [place] monitor and states that they lost the box. Called [employer] and requested box for return.

## 2018-01-06 NOTE — Patient Instructions (Addendum)
Medication Instructions:  Your physician has recommended you make the following change in your medication:  1. INCREASE Metoprolol tartrate to 25 mg twice a day  Your physician has requested that you regularly monitor and record your blood pressure readings at home. Please use the same machine at the same time of day to check your readings and record them to bring to your follow-up visit.  Keep a log of your blood pressure readings so that we can see how they trend.      Palpitations A palpitation is the feeling that your heart:  Has an uneven (irregular) heartbeat.  Is beating faster than normal.  Is fluttering.  Is skipping a beat.  This is usually not a serious problem. In some cases, you may need more medical tests. Follow these instructions at home:  Avoid: ? Caffeine in coffee, tea, soft drinks, diet pills, and energy drinks. ? Chocolate. ? Alcohol.  Do not use any tobacco products. These include cigarettes, chewing tobacco, and e-cigarettes. If you need help quitting, ask your doctor.  Try to reduce your stress. These things may help: ? Yoga. ? Meditation. ? Physical activity. Swimming, jogging, and walking are good choices. ? A method that helps you use your mind to control things in your body, like heartbeats (biofeedback).  Get plenty of rest and sleep.  Take over-the-counter and prescription medicines only as told by your doctor.  Keep all follow-up visits as told by your doctor. This is important. Contact a doctor if:  Your heartbeat is still fast or uneven after 24 hours.  Your palpitations occur more often. Get help right away if:  You have chest pain.  You feel short of breath.  You have a very bad headache.  You feel dizzy.  You pass out (faint). This information is not intended to replace advice given to you by your health care provider. Make sure you discuss any questions you have with your health care provider. Document Released:  03/13/2008 Document Revised: 11/10/2015 Document Reviewed: 02/17/2015 Elsevier Interactive Patient Education  2018 [employer].  Palpitations A palpitation is the feeling that your heartbeat is irregular or is faster than normal. It may feel like your heart is fluttering or skipping a beat. Palpitations are usually not a serious problem. They may be caused by many things, including smoking, caffeine, alcohol, stress, and certain medicines. Although most causes of palpitations are not serious, palpitations can be a sign of a serious medical problem. In some cases, you may need further medical evaluation. Follow these instructions at home: Pay attention to any changes in your symptoms. Take these actions to help with your condition:  Avoid the following: ? Caffeinated coffee, tea, soft drinks, diet pills, and energy drinks. ? Chocolate. ? Alcohol.  Do not use any tobacco products, such as cigarettes, chewing tobacco, and e-cigarettes. If you need help quitting, ask your health care provider.  Try to reduce your stress and anxiety. Things that can help you relax include: ? Yoga. ? Meditation. ? Physical activity, such as swimming, jogging, or walking. ? Biofeedback. This is a method that helps you learn to use your mind to control things in your body, such as your heartbeats.  Get plenty of rest and sleep.  Take over-the-counter and prescription medicines only as told by your health care provider.  Keep all follow-up visits as told by your health care provider. This is important.  Contact a health care provider if:  You continue to have a fast or irregular  heartbeat after 24 hours.  Your palpitations occur more often. Get help right away if:  You have chest pain or shortness of breath.  You have a severe headache.  You feel dizzy or you faint. This information is not intended to replace advice given to you by your health care provider. Make sure you discuss any questions you  have with your health care provider. Document Released: 06/01/2000 Document Revised: 11/07/2015 Document Reviewed: 02/17/2015 Elsevier Interactive Patient Education  2018 [employer].  Premature Atrial Contraction A premature atrial contraction [facility]) is a kind of irregular heartbeat (arrhythmia). It happens when the heart beats too early and then pauses before beating again. PACs are also called skipped heartbeats because they may make you feel like your heart is stopping for a second, even though the heart does not actually skip a beat. The heart has four areas, or chambers. Normally, electrical signals spread across the heart and make all the chambers beat together. During a PAC, the upper chambers of the heart (right atrium and left atrium) beat too early, before they have had time to fill with blood. The heartbeat pauses afterward so the heart can fill with blood for the next beat. What are the causes? The cause of this condition is often unknown. Sometimes it is caused by heart disease or injury to the heart. What increases the risk? This condition is more likely to develop in adults who are 74 years of age or older and in children. Episodes may be triggered by:  Caffeine.  Stress.  Tiredness.  Alcohol.  Smoking.  Stimulant drugs.  Heart disease.  What are the signs or symptoms? Symptoms of this condition include:  A feeling that your heart skipped a beat. The first heartbeat after the "skipped" beat may feel more forceful.  A feeling that your heart is fluttering.  How is this diagnosed? This condition is diagnosed based on:  Your symptoms.  A physical exam. Your health care provider may listen to your heart.  Tests to rule out other conditions, such as a test that records the electrical impulses of the heart and assesses heart health (electrocardiogram, or ECG). If you have an ECG, you may need to wear a portable ECG machine (Holter monitor) that records your heart  beats for 24 hours or more.  How is this treated?  Usually, treatment is not needed for this condition. If you have episodes that happen often or if a cause is found, you may receive treatment for the underlying cause of your PACs. Follow these instructions at home: Lifestyle Follow these instructions as told by your health care provider:  Do not use any products that contain nicotine or tobacco, such as cigarettes and e-cigarettes. If you need help quitting, ask your health care provider.  If caffeine triggers episodes, do not eat, drink, or use anything with caffeine in it.  If caffeine does not seem to trigger episodes, consume caffeine in moderation.  If alcohol triggers episodes of PAC, do not drink alcohol.  If alcohol does not seem to trigger episodes, limit alcohol intake to no more than 1 drink a day for nonpregnant women and 2 drinks a day for men. One drink equals 12 oz of beer, 5 oz of wine, or 1 oz of hard liquor.  Exercise regularly. Ask your health care provider what type of exercise is safe for you.  Find healthy ways to manage stress. Avoid stressful situations when possible.  Try to get at least 7-9 hours of sleep  each night, or as much as recommended by your health care provider.  Do not use illegal drugs.  General instructions  Take over-the-counter and prescription medicines only as told by your health care provider.  Keep all follow-up visits as told by your health care provider. This is important. Contact a health care provider if:  You feel your heart skipping beats more than once a day.  Your heart skips beats and you feel dizzy, light-headed, or very tired. Get help right away if:  You have chest pain.  You have trouble breathing. This information is not intended to replace advice given to you by your health care provider. Make sure you discuss any questions you have with your health care provider. Document Released: 02/05/2014 Document Revised:  01/31/2016 Document Reviewed: 12/02/2015 Elsevier Interactive Patient Education  [employer].

## 2018-01-09 NOTE — Addendum Note (Signed)
Addended by: Alvis Lemmings C on: 01/09/2018 01:36 PM   Modules accepted: Orders

## 2018-01-15 ENCOUNTER — Encounter: Payer: Self-pay | Admitting: Obstetrics and Gynecology

## 2018-01-29 ENCOUNTER — Ambulatory Visit (INDEPENDENT_AMBULATORY_CARE_PROVIDER_SITE_OTHER): Payer: BLUE CROSS/BLUE SHIELD | Admitting: Obstetrics and Gynecology

## 2018-01-29 ENCOUNTER — Ambulatory Visit: Payer: Self-pay | Admitting: Family Medicine

## 2018-01-29 ENCOUNTER — Encounter: Payer: Self-pay | Admitting: Obstetrics and Gynecology

## 2018-01-29 VITALS — BP 110/74 | HR 73 | Ht 61.0 in | Wt 310.7 lb

## 2018-01-29 DIAGNOSIS — N939 Abnormal uterine and vaginal bleeding, unspecified: Secondary | ICD-10-CM | POA: Diagnosis not present

## 2018-01-29 DIAGNOSIS — T8332XA Displacement of intrauterine contraceptive device, initial encounter: Secondary | ICD-10-CM

## 2018-01-29 DIAGNOSIS — Z30432 Encounter for removal of intrauterine contraceptive device: Secondary | ICD-10-CM | POA: Diagnosis not present

## 2018-01-29 DIAGNOSIS — R300 Dysuria: Secondary | ICD-10-CM | POA: Diagnosis not present

## 2018-01-29 DIAGNOSIS — Z9851 Tubal ligation status: Secondary | ICD-10-CM

## 2018-01-29 LAB — POCT URINALYSIS DIPSTICK
BILIRUBIN UA: NEGATIVE
Blood, UA: NEGATIVE
GLUCOSE UA: NEGATIVE
Ketones, UA: NEGATIVE
Nitrite, UA: NEGATIVE
ODOR: POSITIVE
Protein, UA: NEGATIVE
Spec Grav, UA: 1.01 (ref 1.010–1.025)
Urobilinogen, UA: 0.2 E.U./dL
pH, UA: 6 (ref 5.0–8.0)

## 2018-01-29 MED ORDER — NITROFURANTOIN MONOHYD MACRO 100 MG PO CAPS: 100.0000 mg | ORAL_CAPSULE | Freq: Two times a day (BID) | ORAL | 0 refills | Status: DC

## 2018-01-29 NOTE — Progress Notes (Signed)
GYN ENCOUNTER NOTE  Subjective:       Leah Osborne is a 32 y.o. [id number] female is here for gynecologic evaluation of the following issues:  1.  Malpositioned IUD 2.  Abnormal uterine bleeding 3.  UTI symptoms  Patient presents for further evaluation and management regarding possible malpositioned IUD.  On recent MRI the patient was noted to have the IUD located possibly in the lower uterine segment and upper endocervical canal. Mirena IUD was placed on 12/06/2016 for management of abnormal uterine bleeding.  Since that time the patient has not had regulation of her abnormal uterine menses continue to be heavy and crampy.  She is status post tubal ligation in the past.  Pelvic ultrasound in 2017 demonstrated an arcuate shaped uterus. She has had an endometrial biopsy in 6/06//2018 which was benign showing findings of secretory endometrium without evidence of hyperplasia or carcinoma.  MRI of the pelvis with and without contrast on 12/31/2017 demonstrated a low-lying T-shaped IUD which was partially within the upper endocervical canal.  Patient desires IUD removal.  Patient is experiencing some dysuria and frequency without urgency today.  Obstetric History OB History  Gravida Para Term Preterm AB Living  2 2 2  0 0 2  SAB TAB Ectopic Multiple Live Births  0 0 0 0 2    # Outcome Date GA Lbr Len/2nd Weight Sex Delivery Anes PTL Lv  2 Term 2016   8 lb 1.6 oz (3.674 kg) F Vag-Spont   LIV  1 Term 2013   5 lb 2.2 oz (2.331 kg) M Vag-Spont   LIV    Past Medical History:  Diagnosis Date  . Abnormal Pap smear of cervix   . Arthritis   . Bipolar 1 disorder ([place])   . Headache    MIGRAINES  . Hyperlipidemia   . Hypertension   . PTSD (post-traumatic stress disorder)   . PVC (premature ventricular contraction)   . Social phobia     Past Surgical History:  Procedure Laterality Date  . CHOLECYSTECTOMY    . LAPAROSCOPIC TUBAL LIGATION Bilateral 09/19/2015   Procedure: LAPAROSCOPIC  BILATERAL TUBAL BANDING;  Surgeon: Brayton Mars, MD;  Location: [facility] ORS;  Service: Gynecology;  Laterality: Bilateral;    Current Outpatient Medications on File Prior to Visit  Medication Sig Dispense Refill  . celecoxib (CELEBREX) 100 MG capsule Take 100 mg by mouth 2 (two) times daily.    Marland Kitchen levonorgestrel (MIRENA) 20 MCG/24HR IUD 1 each by Intrauterine route once. 11/2016    . levothyroxine (SYNTHROID, LEVOTHROID) 25 MCG tablet Take 1 tablet (25 mcg total) by mouth daily before breakfast. 90 tablet 3  . metoprolol tartrate (LOPRESSOR) 25 MG tablet Take 1 tablet (25 mg total) by mouth 2 (two) times daily. 180 tablet 3  . omeprazole (PRILOSEC) 40 MG capsule Take 40 mg by mouth daily.     No current facility-administered medications on file prior to visit.     No Known Allergies  Social History   Socioeconomic History  . Marital status: Married    Spouse name: Not on file  . Number of children: Not on file  . Years of education: Not on file  . Highest education level: Not on file  Occupational History  . Not on file  Social Needs  . Financial resource strain: Not on file  . Food insecurity:    Worry: Not on file    Inability: Not on file  . Transportation needs:    Medical: Not  on file    Non-medical: Not on file  Tobacco Use  . Smoking status: Never Smoker  . Smokeless tobacco: Never Used  Substance and Sexual Activity  . Alcohol use: Not Currently    Alcohol/week: 0.0 standard drinks  . Drug use: No  . Sexual activity: Not on file  Lifestyle  . Physical activity:    Days per week: Not on file    Minutes per session: Not on file  . Stress: Not on file  Relationships  . Social connections:    Talks on phone: Not on file    Gets together: Not on file    Attends religious service: Not on file    Active member of club or organization: Not on file    Attends meetings of clubs or organizations: Not on file    Relationship status: Not on file  . Intimate  partner violence:    Fear of current or ex partner: Not on file    Emotionally abused: Not on file    Physically abused: Not on file    Forced sexual activity: Not on file  Other Topics Concern  . Not on file  Social History Narrative  . Not on file    Family History  Problem Relation Age of Onset  . Diabetes Maternal Grandmother   . Heart disease Maternal Grandmother   . Heart failure Maternal Grandmother   . Heart attack Maternal Grandmother   . Rheum arthritis Mother   . Healthy Father   . Heart Problems Maternal Grandfather   . Atrial fibrillation Paternal Grandmother   . Cancer Neg Hx     The following portions of the patient's history were reviewed and updated as appropriate: allergies, current medications, past family history, past medical history, past social history, past surgical history and problem list.  Review of Systems Review of Systems  Constitutional: Negative for chills, diaphoresis and fever.  Respiratory: Negative.   Cardiovascular: Negative.   Gastrointestinal: Negative.   Genitourinary: Positive for dysuria and frequency. Negative for urgency.       No significant pelvic pain. Menses are heavy but regular  Musculoskeletal: Negative.   Skin: Negative.   Neurological: Negative.   Endo/Heme/Allergies: Negative.   Psychiatric/Behavioral: Negative.      Objective:   BP 110/74   Pulse 73   Ht 5\' 1"  (1.549 m)   Wt (!) 310 lb 11.2 oz (140.9 kg)   LMP 01/09/2018 (Exact Date)   BMI 58.71 kg/m  CONSTITUTIONAL: Well-developed, well-nourished female in no acute distress.  HENT:  Normocephalic, atraumatic.  NECK: Normal range of motion, supple, no masses.  Normal thyroid.  SKIN: Skin is warm and dry. No rash noted. Not diaphoretic. No erythema. No pallor. [place]: Alert and oriented to person, place, and time. PSYCHIATRIC: Normal mood and affect. Normal behavior. Normal judgment and thought content. CARDIOVASCULAR:Not Examined RESPIRATORY: Not  Examined BREASTS: Not Examined ABDOMEN: Soft, non distended; Non tender.  No Organomegaly. BLADDER: Nontender PELVIC:  External Genitalia: Normal  BUS: Normal  Vagina: Normal; no significant discharge  Cervix: Normal; IUD strings 3 to 4 cm in length; no cervical motion tenderness  Uterus: Normal size, shape,consistency, mobile, nontender  Adnexa: Normal; nonpalpable nontender  RV: Normal external exam  Bladder: Nontender MUSCULOSKELETAL: Normal range of motion. No tenderness.  No cyanosis, clubbing, or edema.   PROCEDURE: Mirena IUD removal Verbal consent is obtained.  Patient is placed in dorsolithotomy position.  Graves speculum was placed into the vagina to facilitate visualization of the  cervix and upper adjacent vagina.  IUD strings are identified and measure 3 to 4 cm.  IUD strings are grasped with [place] forceps and the IUD is removed in standard fashion without difficulty.  Minimal bleeding is encountered.  Procedure is well-tolerated.  Assessment:   1. Burning with urination - Urine Culture - POCT urinalysis dipstick  2. Abnormal uterine bleeding-menorrhagia  3. Malpositioned intrauterine device (IUD), initial encounter  4. Status post tubal ligation     Plan:   1.  Mirena IUD is removed today. 2.  Pelvic ultrasound to confirm IUD location is deferred due to patient's desire for IUD removal 3.  Contraception-tubal ligation 4.  Maintain menstrual calendar monitoring over the next 4 months 5.  Return in 4 months for follow-up on bleeding and for further management planning. 6.  Urinalysis and urine culture are obtained 7.  Macrobid twice daily for 7 days  A total of 15 minutes were spent face-to-face with the patient during this encounter and over half of that time dealt with counseling and coordination of care.  Brayton Mars, MD  Note: This dictation was prepared with Dragon dictation along with smaller phrase technology. Any transcriptional errors that  result from this process are unintentional.

## 2018-01-29 NOTE — Patient Instructions (Signed)
1.  Mirena IUD is removed today.   2.  Urinalysis demonstrates white blood cells in the urine.  Urine culture is sent. 3.  Begin Macrobid 1 pill twice a day for 7 days for suspected UTI. 4.  Maintain menstrual calendar monitoring for abnormal uterine bleeding analysis over the next 4 months. 5.  Return in 4 months for follow-up.

## 2018-01-31 LAB — URINE CULTURE

## 2018-02-03 NOTE — Telephone Encounter (Signed)
Mychart message

## 2018-02-04 NOTE — Telephone Encounter (Signed)
Please respond on mychart or call patient.  Let her know that she will need to schedule two sooner apt. She needs fasting lab only and then office visit follow-up within next 1-4 weeks. We can keep her "Annual Physical for 04/17/18" currently, and re-schedule this in future if need.  Let me know when she has been scheduled.  Nobie Putnam, [facility] 02/04/2018, 8:10 AM

## 2018-02-05 ENCOUNTER — Other Ambulatory Visit: Payer: Self-pay | Admitting: Family Medicine

## 2018-02-05 DIAGNOSIS — D500 Iron deficiency anemia secondary to blood loss (chronic): Secondary | ICD-10-CM

## 2018-02-05 DIAGNOSIS — F419 Anxiety disorder, unspecified: Secondary | ICD-10-CM

## 2018-02-05 DIAGNOSIS — E039 Hypothyroidism, unspecified: Secondary | ICD-10-CM

## 2018-02-05 DIAGNOSIS — E785 Hyperlipidemia, unspecified: Secondary | ICD-10-CM | POA: Insufficient documentation

## 2018-02-05 DIAGNOSIS — F329 Major depressive disorder, single episode, unspecified: Secondary | ICD-10-CM

## 2018-02-05 DIAGNOSIS — E559 Vitamin D deficiency, unspecified: Secondary | ICD-10-CM

## 2018-02-05 DIAGNOSIS — R7309 Other abnormal glucose: Secondary | ICD-10-CM

## 2018-02-05 DIAGNOSIS — I10 Essential (primary) hypertension: Secondary | ICD-10-CM

## 2018-02-05 DIAGNOSIS — F314 Bipolar disorder, current episode depressed, severe, without psychotic features: Secondary | ICD-10-CM

## 2018-02-05 DIAGNOSIS — F32A Depression, unspecified: Secondary | ICD-10-CM

## 2018-02-10 ENCOUNTER — Other Ambulatory Visit: Payer: Self-pay

## 2018-02-10 DIAGNOSIS — E785 Hyperlipidemia, unspecified: Secondary | ICD-10-CM | POA: Diagnosis not present

## 2018-02-10 DIAGNOSIS — E039 Hypothyroidism, unspecified: Secondary | ICD-10-CM | POA: Diagnosis not present

## 2018-02-10 DIAGNOSIS — F32A Depression, unspecified: Secondary | ICD-10-CM

## 2018-02-10 DIAGNOSIS — E559 Vitamin D deficiency, unspecified: Secondary | ICD-10-CM

## 2018-02-10 DIAGNOSIS — F329 Major depressive disorder, single episode, unspecified: Secondary | ICD-10-CM

## 2018-02-10 DIAGNOSIS — I10 Essential (primary) hypertension: Secondary | ICD-10-CM

## 2018-02-10 DIAGNOSIS — R7309 Other abnormal glucose: Secondary | ICD-10-CM

## 2018-02-10 DIAGNOSIS — F419 Anxiety disorder, unspecified: Secondary | ICD-10-CM

## 2018-02-10 DIAGNOSIS — D500 Iron deficiency anemia secondary to blood loss (chronic): Secondary | ICD-10-CM

## 2018-02-10 DIAGNOSIS — F314 Bipolar disorder, current episode depressed, severe, without psychotic features: Secondary | ICD-10-CM

## 2018-02-11 LAB — COMPLETE METABOLIC PANEL WITH GFR
AG RATIO: 1.6 (calc) (ref 1.0–2.5)
ALT: 12 U/L (ref 6–29)
AST: 13 U/L (ref 10–30)
Albumin: 4.2 g/dL (ref 3.6–5.1)
Alkaline phosphatase (APISO): 63 U/L (ref 33–115)
BUN: 23 mg/dL (ref 7–25)
CALCIUM: 9.5 mg/dL (ref 8.6–10.2)
CO2: 28 mmol/L (ref 20–32)
CREATININE: 0.89 mg/dL (ref 0.50–1.10)
Chloride: 105 mmol/L (ref 98–110)
GFR, EST AFRICAN AMERICAN: 101 mL/min/{1.73_m2} (ref >=60)
GFR, EST NON AFRICAN AMERICAN: 87 mL/min/{1.73_m2} (ref >=60)
GLUCOSE: 96 mg/dL (ref 65–99)
Globulin: 2.6 g/dL (calc) (ref 1.9–3.7)
Potassium: 4.5 mmol/L (ref 3.5–5.3)
Sodium: 141 mmol/L (ref 135–146)
TOTAL PROTEIN: 6.8 g/dL (ref 6.1–8.1)
Total Bilirubin: 0.3 mg/dL (ref 0.2–1.2)

## 2018-02-11 LAB — CBC WITH DIFFERENTIAL/PLATELET
BASOS ABS: 37 {cells}/uL (ref 0–200)
Basophils Relative: 0.4 %
EOS ABS: 195 {cells}/uL (ref 15–500)
EOS PCT: 2.1 %
HEMATOCRIT: 38.7 % (ref 35.0–45.0)
HEMOGLOBIN: 12.5 g/dL (ref 11.7–15.5)
Lymphs Abs: 2372 cells/uL (ref 850–3900)
MCH: 26.4 pg — AB (ref 27.0–33.0)
MCHC: 32.3 g/dL (ref 32.0–36.0)
MCV: 81.6 fL (ref 80.0–100.0)
MONOS PCT: 6.9 %
MPV: 9.4 fL (ref 7.5–12.5)
NEUTROS PCT: 65.1 %
Neutro Abs: 6054 cells/uL (ref 1500–7800)
Platelets: 385 10*3/uL (ref 140–400)
RBC: 4.74 10*6/uL (ref 3.80–5.10)
RDW: 13.8 % (ref 11.0–15.0)
Total Lymphocyte: 25.5 %
WBC mixed population: 642 cells/uL (ref 200–950)
WBC: 9.3 10*3/uL (ref 3.8–10.8)

## 2018-02-11 LAB — TSH: TSH: 4.41 mIU/L

## 2018-02-11 LAB — LIPID PANEL
CHOL/HDL RATIO: 4.4 (calc) (ref <5.0)
Cholesterol: 151 mg/dL (ref <200)
HDL: 34 mg/dL — AB (ref >50)
LDL CHOLESTEROL (CALC): 98 mg/dL
NON-HDL CHOLESTEROL (CALC): 117 mg/dL (ref <130)
TRIGLYCERIDES: 97 mg/dL (ref <150)

## 2018-02-11 LAB — HEMOGLOBIN A1C
Hgb A1c MFr Bld: 6 % of total Hgb — ABNORMAL HIGH (ref <5.7)
Mean Plasma Glucose: 126 (calc)
eAG (mmol/L): 7 (calc)

## 2018-02-11 LAB — VITAMIN D 25 HYDROXY (VIT D DEFICIENCY, FRACTURES): Vit D, 25-Hydroxy: 22 ng/mL — ABNORMAL LOW (ref 30–100)

## 2018-02-11 LAB — T4, FREE: Free T4: 1.1 ng/dL (ref 0.8–1.8)

## 2018-02-12 ENCOUNTER — Encounter: Payer: Self-pay | Admitting: Family Medicine

## 2018-02-12 DIAGNOSIS — R7309 Other abnormal glucose: Secondary | ICD-10-CM | POA: Insufficient documentation

## 2018-02-19 ENCOUNTER — Encounter: Payer: Self-pay | Admitting: Family Medicine

## 2018-02-19 ENCOUNTER — Ambulatory Visit (INDEPENDENT_AMBULATORY_CARE_PROVIDER_SITE_OTHER): Payer: BLUE CROSS/BLUE SHIELD | Admitting: Family Medicine

## 2018-02-19 VITALS — BP 118/68 | HR 75 | Temp 98.4°F | Resp 16 | Ht 61.0 in | Wt 315.6 lb

## 2018-02-19 DIAGNOSIS — E559 Vitamin D deficiency, unspecified: Secondary | ICD-10-CM | POA: Diagnosis not present

## 2018-02-19 DIAGNOSIS — R7309 Other abnormal glucose: Secondary | ICD-10-CM

## 2018-02-19 DIAGNOSIS — F314 Bipolar disorder, current episode depressed, severe, without psychotic features: Secondary | ICD-10-CM | POA: Diagnosis not present

## 2018-02-19 NOTE — Patient Instructions (Addendum)
Thank you for coming to the office today.  A1c 6.0, as discussed concern for Pre-Diabetes.  Proceed with low carb low sugar diet changes and exercise, and follow-up with [facility] 04/11/18  In the future we can consider some newer diabetic medications such as the injectable treatments Ozempic, Trulicity, Victoza, Saxenda to help treat weight and control sugar.  Mildly low Vitamin D on last lab. Start OTC Vitamin D3 5,000 iu daily for 12 weeks then reduce to OTC Vitamin D3 2,000 iu daily for maintenance  May need to return to [facility] after you work on improving sugar if urinary symptoms do not improve.  Please schedule a Follow-up Appointment to: Return if symptoms worsen or fail to improve, for Keep october physical.  If you have any other questions or concerns, please feel free to call the office or send a message through [facility]. You may also schedule an earlier appointment if necessary.  Additionally, you may be receiving a survey about your experience at our office within a few days to 1 week by e-mail or mail. We value your feedback.  Nobie Putnam, DO [place]

## 2018-02-19 NOTE — Progress Notes (Signed)
Subjective:    Patient ID: Leah Osborne, female    DOB: 23-Oct-1985, 32 y.o.   MRN: [id number]  Leah Osborne is a 32 y.o. female presenting on 02/19/2018 for Hyperglycemia (Elevated A1c, Vit D follow up lab result) and Obesity   HPI  Elevated A1c / Pre-Diabetes / Morbid Obesity BMI 59 / Abnormal Weight Gain Recent lab show A1c up to 6.0, from prior 5.5. She has concerns with persistent abnormal weight gain up to 15 lbs in past 6 weeks, overall continues to gain weight. Concern on medications such as Metoprolol and in past Abilify. - She has tried to improve lifestyle, diet eats frequent salads, grilled chicken, drinking more water - she is avoiding caffeine -In past improved on phentermine, now she is scheduled to go to [facility] in October (04/11/18) and will discuss treatment options from them, she was considering bariatric in past. Admits some issues with fatigue Admits some dysuria and episodes of UTI that were negative on urine culture did improve on antibiotics. Now asymptomatic. Thinks related to her high sugars.  Vitamin D Deficiency Vitamin D - 22, still low. Last time 20. Taking Vitamin D3 2,000 iu daily maintenance.  Bipolar Disorder / Mood Disorder / Anxiety Followed by [facility] in past, previously was on Venlafaxine and other SSRI/SNRI, she was then managed with Abilify and therapy and doing well. Today she reports concern with no longer following with [facility] since her therapist left. She has not returned to psychiatry and has been off abilify for a while. - Family member went to [facility] and she was not happy with their care.   Health Maintenance: Due Flu Shot will return. Followed by [facility] for OBGYN - defer pap smear to their office.  Depression screen [facility] 2/9 02/19/2018 05/28/2016 01/23/2016  Decreased Interest 0 0 2  Down, Depressed, Hopeless 0 0 2  PHQ - 2 Score 0 0 4  Altered sleeping 0 0 3  Tired, decreased energy 0 3 3  Change in  appetite 0 0 3  Feeling bad or failure about yourself  0 0 2  Trouble concentrating 0 0 0  Moving slowly or fidgety/restless 0 1 0  Suicidal thoughts 0 0 1  PHQ-9 Score 0 4 16  Difficult doing work/chores - - Extremely dIfficult    Social History   Tobacco Use  . Smoking status: Never Smoker  . Smokeless tobacco: Never Used  Substance Use Topics  . Alcohol use: Not Currently    Alcohol/week: 0.0 standard drinks  . Drug use: No    Review of Systems Per HPI unless specifically indicated above     Objective:    BP 118/68   Pulse 75   Temp 98.4 F (36.9 C) (Oral)   Resp 16   Ht 5\' 1"  (1.549 m)   Wt (!) 315 lb 9.6 oz (143.2 kg)   BMI 59.63 kg/m   Wt Readings from Last 3 Encounters:  02/19/18 (!) 315 lb 9.6 oz (143.2 kg)  01/29/18 (!) 310 lb 11.2 oz (140.9 kg)  01/06/18 (!) 301 lb (136.5 kg)    Physical Exam  Constitutional: She is oriented to person, place, and time. She appears well-developed and well-nourished. No distress.  Well-appearing, comfortable, cooperative, morbid obesity  HENT:  Head: Normocephalic and atraumatic.  Mouth/Throat: Oropharynx is clear and moist.  Eyes: Conjunctivae are normal. Right eye exhibits no discharge. Left eye exhibits no discharge.  Cardiovascular: Normal rate.  Pulmonary/Chest: Effort normal.  Musculoskeletal:  She exhibits no edema.  Neurological: She is alert and oriented to person, place, and time.  Skin: Skin is warm and dry. No rash noted. She is not diaphoretic. No erythema.  Psychiatric: She has a normal mood and affect. Her behavior is normal.  Well groomed, good eye contact, normal speech and thoughts. Briefly tearful during history when discussing her previous therapist and history of bipolar.  Nursing note and vitals reviewed.  Results for orders placed or performed in visit on 02/10/18  T4, free  Result Value Ref Range   Free T4 1.1 0.8 - 1.8 ng/dL  Hemoglobin A1c  Result Value Ref Range   Hgb A1c MFr Bld 6.0 (H)  <5.7 % of total Hgb   Mean Plasma Glucose 126 (calc)   eAG (mmol/L) 7.0 (calc)  CBC with Differential/Platelet  Result Value Ref Range   WBC 9.3 3.8 - 10.8 Thousand/uL   RBC 4.74 3.80 - 5.10 Million/uL   Hemoglobin 12.5 11.7 - 15.5 g/dL   HCT 38.7 35.0 - 45.0 %   MCV 81.6 80.0 - 100.0 fL   MCH 26.4 (L) 27.0 - 33.0 pg   MCHC 32.3 32.0 - 36.0 g/dL   RDW 13.8 11.0 - 15.0 %   Platelets 385 140 - 400 Thousand/uL   MPV 9.4 7.5 - 12.5 fL   Neutro Abs 6,054 1,500 - 7,800 cells/uL   Lymphs Abs 2,372 850 - 3,900 cells/uL   WBC mixed population 642 200 - 950 cells/uL   Eosinophils Absolute 195 15 - 500 cells/uL   Basophils Absolute 37 0 - 200 cells/uL   Neutrophils Relative % 65.1 %   Total Lymphocyte 25.5 %   Monocytes Relative 6.9 %   Eosinophils Relative 2.1 %   Basophils Relative 0.4 %  COMPLETE METABOLIC PANEL WITH GFR  Result Value Ref Range   Glucose, Bld 96 65 - 99 mg/dL   BUN 23 7 - 25 mg/dL   Creat 0.89 0.50 - 1.10 mg/dL   GFR, Est Non African American 87 > OR = 60 mL/min/1.47m2   GFR, Est African American 101 > OR = 60 mL/min/1.72m2   BUN/Creatinine Ratio NOT APPLICABLE 6 - 22 (calc)   Sodium 141 135 - 146 mmol/L   Potassium 4.5 3.5 - 5.3 mmol/L   Chloride 105 98 - 110 mmol/L   CO2 28 20 - 32 mmol/L   Calcium 9.5 8.6 - 10.2 mg/dL   Total Protein 6.8 6.1 - 8.1 g/dL   Albumin 4.2 3.6 - 5.1 g/dL   Globulin 2.6 1.9 - 3.7 g/dL (calc)   AG Ratio 1.6 1.0 - 2.5 (calc)   Total Bilirubin 0.3 0.2 - 1.2 mg/dL   Alkaline phosphatase (APISO) 63 33 - 115 U/L   AST 13 10 - 30 U/L   ALT 12 6 - 29 U/L  Lipid panel  Result Value Ref Range   Cholesterol 151 <200 mg/dL   HDL 34 (L) >50 mg/dL   Triglycerides 97 <150 mg/dL   LDL Cholesterol (Calc) 98 mg/dL (calc)   Total CHOL/HDL Ratio 4.4 <5.0 (calc)   Non-HDL Cholesterol (Calc) 117 <130 mg/dL (calc)  VITAMIN D 25 Hydroxy (Vit-D Deficiency, Fractures)  Result Value Ref Range   Vit D, 25-Hydroxy 22 (L) 30 - 100 ng/mL  TSH  Result  Value Ref Range   TSH 4.41 mIU/L      Assessment & Plan:   Problem List Items Addressed This Visit    Elevated hemoglobin A1c - Primary Morbid (severe)  obesity due to excess calories (HCC) Worsening control vs new dx Pre-DM with A1c 6.0 from prior 5.5 Concern with morbid obesity, HTN, HLD Abnormal weight gain  Diff dx - hypothyroidism (already treated) / secondary to medication side effects atypical anti-psychotic likely contributing.  Plan:  1. Not on any therapy currently - discussed med options briefly, defer metformin, counseling on lifestyle improvements, in future consider GLP1 class as a weight loss medication in Pre-Diabetes in future vs Saxenda. 2. Encourage improved lifestyle - low carb, low sugar diet, reduce portion size, continue improving regular exercise - handout given glycemic diet. 3. Follow-up 4 months - after she sees [facility], and determine treatment options - will re-check A1c        Vitamin D deficiency Mild low Vit D Increase dose to therapeutic VItD3 5k daily for 12 weeks then reduce to 2k   Bipolar disorder, current episode depressed, severe, without psychotic features ([place])   (Chronic)   Seems less well controlled now not following up with [facility] and off abilify and no longer therapy - Advised her to return to [facility] despite no longer having her prior therapist best plan is to re-establish with a different therapist there, where she is comfortable and re-discuss medication management, I advised her that I do not routinely treat bipolar that is more severe and has other comorbid factors.          No orders of the defined types were placed in this encounter.   Follow up plan: Return if symptoms worsen or fail to improve, for Keep october physical.  No additional lab work is needed for upcoming annual physical  Nobie Putnam, [facility] 02/19/2018, 6:07 PM

## 2018-02-24 ENCOUNTER — Other Ambulatory Visit: Payer: Self-pay | Admitting: Cardiovascular Disease

## 2018-03-07 ENCOUNTER — Ambulatory Visit
Admit: 2018-03-07 | Discharge: 2018-03-08 | Payer: MEDICAID | Attending: Physical Medicine & Rehabilitation | Primary: Physical Medicine & Rehabilitation

## 2018-03-07 DIAGNOSIS — M545 Low back pain: Secondary | ICD-10-CM

## 2018-03-07 DIAGNOSIS — G5602 Carpal tunnel syndrome, left upper limb: Secondary | ICD-10-CM

## 2018-03-07 DIAGNOSIS — G8929 Other chronic pain: Secondary | ICD-10-CM

## 2018-03-07 DIAGNOSIS — Z6841 Body Mass Index (BMI) 40.0 and over, adult: Secondary | ICD-10-CM

## 2018-03-07 DIAGNOSIS — M461 Sacroiliitis, not elsewhere classified: Principal | ICD-10-CM

## 2018-03-07 MED ORDER — MEDICAL SUPPLY ITEM
0 refills | 0 days | Status: CP
Start: 2018-03-07 — End: ?

## 2018-03-17 ENCOUNTER — Ambulatory Visit: Admit: 2018-03-17 | Discharge: 2018-03-18 | Payer: MEDICAID

## 2018-03-17 ENCOUNTER — Ambulatory Visit
Admit: 2018-03-17 | Discharge: 2018-03-18 | Payer: MEDICAID | Attending: Physical Medicine & Rehabilitation | Primary: Physical Medicine & Rehabilitation

## 2018-03-17 DIAGNOSIS — M461 Sacroiliitis, not elsewhere classified: Principal | ICD-10-CM

## 2018-03-17 DIAGNOSIS — M533 Sacrococcygeal disorders, not elsewhere classified: Principal | ICD-10-CM

## 2018-03-24 ENCOUNTER — Emergency Department
Admission: EM | Admit: 2018-03-24 | Discharge: 2018-03-24 | Disposition: A | Discharge status: Home / Self Care | Payer: BLUE CROSS/BLUE SHIELD | Attending: Student in an Organized Health Care Education/Training Program | Admitting: Student in an Organized Health Care Education/Training Program

## 2018-03-24 ENCOUNTER — Encounter: Payer: Self-pay | Admitting: Emergency Medicine

## 2018-03-24 ENCOUNTER — Emergency Department: Payer: BLUE CROSS/BLUE SHIELD

## 2018-03-24 ENCOUNTER — Telehealth: Payer: Self-pay | Admitting: Cardiovascular Disease

## 2018-03-24 DIAGNOSIS — R079 Chest pain, unspecified: Secondary | ICD-10-CM | POA: Diagnosis not present

## 2018-03-24 DIAGNOSIS — E039 Hypothyroidism, unspecified: Secondary | ICD-10-CM | POA: Insufficient documentation

## 2018-03-24 DIAGNOSIS — Z79899 Other long term (current) drug therapy: Secondary | ICD-10-CM | POA: Diagnosis not present

## 2018-03-24 DIAGNOSIS — I10 Essential (primary) hypertension: Secondary | ICD-10-CM | POA: Diagnosis not present

## 2018-03-24 DIAGNOSIS — R0789 Other chest pain: Secondary | ICD-10-CM | POA: Insufficient documentation

## 2018-03-24 DIAGNOSIS — R0602 Shortness of breath: Secondary | ICD-10-CM | POA: Diagnosis not present

## 2018-03-24 LAB — CBC
HCT: 40.8 % (ref 35.0–47.0)
HEMOGLOBIN: 13.4 g/dL (ref 12.0–16.0)
MCH: 26.7 pg (ref 26.0–34.0)
MCHC: 32.8 g/dL (ref 32.0–36.0)
MCV: 81.4 fL (ref 80.0–100.0)
PLATELETS: 475 10*3/uL — AB (ref 150–440)
RBC: 5.01 MIL/uL (ref 3.80–5.20)
RDW: 14.8 % — ABNORMAL HIGH (ref 11.5–14.5)
WBC: 14.8 10*3/uL — ABNORMAL HIGH (ref 3.6–11.0)

## 2018-03-24 LAB — BASIC METABOLIC PANEL
Anion gap: 7 (ref 5–15)
BUN: 16 mg/dL (ref 6–20)
CALCIUM: 8.9 mg/dL (ref 8.9–10.3)
CO2: 29 mmol/L (ref 22–32)
CREATININE: 0.8 mg/dL (ref 0.44–1.00)
Chloride: 102 mmol/L (ref 98–111)
GFR calc Af Amer: >60 mL/min (ref >60)
GFR calc non Af Amer: >60 mL/min (ref >60)
GLUCOSE: 102 mg/dL — AB (ref 70–99)
Potassium: 4.1 mmol/L (ref 3.5–5.1)
Sodium: 138 mmol/L (ref 135–145)

## 2018-03-24 LAB — TROPONIN I: Troponin I: <0.03 ng/mL (ref <0.03)

## 2018-03-24 LAB — HCG, QUANTITATIVE, PREGNANCY: hCG, Beta Chain, Quant, S: <1 m[IU]/mL (ref <5)

## 2018-03-24 MED ORDER — HYDROCODONE-ACETAMINOPHEN 5-325 MG PO TABS
1.0000 | ORAL_TABLET | Freq: Once | ORAL | Status: AC
  Administered 2018-03-24: 1 via ORAL
  Filled 2018-03-24: qty 1

## 2018-03-24 MED ORDER — NAPROXEN 500 MG PO TABS: 500.0000 mg | ORAL_TABLET | Freq: Two times a day (BID) | ORAL | 0 refills | Status: DC

## 2018-03-24 MED ORDER — CYCLOBENZAPRINE HCL 10 MG PO TABS: 10.0000 mg | ORAL_TABLET | Freq: Three times a day (TID) | ORAL | 0 refills | Status: DC | PRN

## 2018-03-24 NOTE — Telephone Encounter (Signed)
Spoke with patient and she states that last night when she watching TV and she developed chest pain and was not able to catch her breath. She sat up and it got better. She states that she still has a ache feeling to her chest. She reports continued chest pressure with some shortness of breath that is not different from before. Reviewed all of her symptoms with her in detail and given that she has continued chest pressure I advised for her to go to ED for further evaluation. Reviewed that we would get her scheduled to come in for follow up but for now I think further evaluation in the ED would be advised. She was agreeable with this plan and had no further questions.

## 2018-03-24 NOTE — ED Triage Notes (Signed)
Pt reports last pm while laying flat she started feeling like something was sitting on her chest and she could not breathe. Pt states that she sat up the rest of the night but this am still has the discomfort.

## 2018-03-24 NOTE — Telephone Encounter (Signed)
Pt c/o of Chest Pain: STAT if CP now or developed within 24 hours  1. Are you having CP right now? No, it is better if she lays on her side, chest pressure  2. Are you experiencing any other symptoms (ex. SOB, nausea, vomiting, sweating)? Not at the moment, just extremely tired  3. How long have you been experiencing CP? Since 10 pm last night  4. Is your CP continuous or coming and going? Comes and goes  5. Have you taken Nitroglycerin? No  ?

## 2018-03-24 NOTE — ED Notes (Addendum)
Patient is resting comfortably. Family at bedside.  

## 2018-03-24 NOTE — ED Provider Notes (Addendum)
[facility] Emergency Department Provider Note    First MD Initiated Contact with Patient 03/24/18 1227     (approximate)  I have reviewed the triage vital signs and the nursing notes.   HISTORY  Chief Complaint Chest Pain and Shortness of Breath    HPI Leah Osborne is a 32 y.o. female presents the ER for evaluation of chest pain that started last night while she was lying in bed.  States that she did feel some improvement when sitting up.  Felt that someone was sitting on her chest.  There is no associated diaphoresis nausea or vomiting.  Not had any cough or recent illnesses.  No fevers.  Does have significant family history of heart disease.  Did not take anything for the pain.  States that she did not have anything to eat this morning was trying to go to her primary care physician's office but she is having active chest pain was directed to the ER for further evaluation.  She does not smoke.    Past Medical History:  Diagnosis Date  . Abnormal Pap smear of cervix   . Arthritis   . Bipolar 1 disorder ([place])   . Headache    MIGRAINES  . Hyperlipidemia   . Hypertension   . PTSD (post-traumatic stress disorder)   . PVC (premature ventricular contraction)   . Social phobia    Family History  Problem Relation Age of Onset  . Diabetes Maternal Grandmother   . Heart disease Maternal Grandmother   . Heart failure Maternal Grandmother   . Heart attack Maternal Grandmother   . Rheum arthritis Mother   . Healthy Father   . Heart Problems Maternal Grandfather   . Atrial fibrillation Paternal Grandmother   . Cancer Neg Hx    Past Surgical History:  Procedure Laterality Date  . CHOLECYSTECTOMY    . LAPAROSCOPIC TUBAL LIGATION Bilateral 09/19/2015   Procedure: LAPAROSCOPIC BILATERAL TUBAL BANDING;  Surgeon: Brayton Mars, MD;  Location: [facility] ORS;  Service: Gynecology;  Laterality: Bilateral;   Patient Active Problem List   Diagnosis Date  Noted  . Elevated hemoglobin A1c 02/12/2018  . Dyslipidemia 02/05/2018  . Abnormal skin growth 11/15/2016  . PTSD (post-traumatic stress disorder) 10/24/2016  . Severe bipolar I disorder, most recent episode depressed without psychotic features ([place]) 10/23/2016  . Ankylosing spondylitis ([place]) 10/23/2016  . Sacroiliac inflammation ([place]) 08/02/2016  . Hypertension 05/28/2016  . Vitamin D deficiency 05/02/2016  . 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 Hypothyroidism 02/13/2016  . Anxiety and depression 01/23/2016  . Status post tubal ligation 09/22/2015  . Morbid (severe) obesity due to excess calories ([place]) 04/25/2015  . Anemia, iron deficiency 01/04/2015  . Headache, migraine 01/04/2015  . Abnormal cytological findings in female genital organs 07/04/2011      Prior to Admission medications   Medication Sig Start Date End Date Taking? Authorizing Provider  celecoxib (CELEBREX) 100 MG capsule Take 100 mg by mouth 2 (two) times daily.    [provider]  cyclobenzaprine (FLEXERIL) 10 MG tablet Take 1 tablet (10 mg total) by mouth 3 (three) times daily as needed for muscle spasms. 03/24/18   Merlyn Lot, MD  levonorgestrel (MIRENA) 20 MCG/24HR IUD 1 each by Intrauterine route once. 11/2016    [provider]  levothyroxine (SYNTHROID, LEVOTHROID) 25 MCG tablet Take 1 tablet (25 mcg total) by mouth daily before breakfast. 07/22/17   Olin Hauser, DO  metoprolol  tartrate (LOPRESSOR) 25 MG tablet Take 1 tablet (25 mg total) by mouth 2 (two) times daily. 01/06/18 04/06/18  Minna Merritts, MD  metoprolol tartrate (LOPRESSOR) 25 MG tablet Take 1 tablet (25 mg total) by mouth 2 (two) times daily. 02/24/18 05/25/18  Minna Merritts, MD  naproxen (NAPROSYN) 500 MG tablet Take 1 tablet (500 mg total) by mouth 2 (two) times daily with a meal. 03/24/18 03/24/19  Merlyn Lot, MD  omeprazole (PRILOSEC) 40 MG capsule Take 40 mg by mouth  daily.    [provider]    Allergies Patient has no known allergies.    Social History Social History   Tobacco Use  . Smoking status: Never Smoker  . Smokeless tobacco: Never Used  Substance Use Topics  . Alcohol use: Not Currently    Alcohol/week: 0.0 standard drinks  . Drug use: No    Review of Systems Patient denies headaches, rhinorrhea, blurry vision, numbness, shortness of breath, chest pain, edema, cough, abdominal pain, nausea, vomiting, diarrhea, dysuria, fevers, rashes or hallucinations unless otherwise stated above in HPI. ____________________________________________   PHYSICAL EXAM:  VITAL SIGNS: Vitals:   03/24/18 1230 03/24/18 1300  BP: 139/85 140/77  Pulse: 73 67  Resp: 19 20  Temp:    SpO2: 98% 98%    Constitutional: Alert and oriented.  Eyes: Conjunctivae are normal.  Head: Atraumatic. Nose: No congestion/rhinnorhea. Mouth/Throat: Mucous membranes are moist.   Neck: No stridor. Painless ROM.  Cardiovascular: Normal rate, regular rhythm. Grossly normal heart sounds.  Good peripheral circulation. Respiratory: Normal respiratory effort.  No retractions. Lungs CTAB. Gastrointestinal: Soft and nontender. No distention. No abdominal bruits. No CVA tenderness. Genitourinary:  Musculoskeletal: No lower extremity tenderness nor edema.  ttp along left anterior chest wall pain with palpation.  No joint effusions. Neurologic:  Normal speech and language. No gross focal neurologic deficits are appreciated. No facial droop Skin:  Skin is warm, dry and intact. No rash noted. Psychiatric: Mood and affect are normal. Speech and behavior are normal.  ____________________________________________   LABS (all labs ordered are listed, but only abnormal results are displayed)  Results for orders placed or performed during the hospital encounter of 03/24/18 (from the past 24 hour(s))  Basic metabolic panel     Status: Abnormal   Collection Time:  03/24/18 10:34 AM  Result Value Ref Range   Sodium 138 135 - 145 mmol/L   Potassium 4.1 3.5 - 5.1 mmol/L   Chloride 102 98 - 111 mmol/L   CO2 29 22 - 32 mmol/L   Glucose, Bld 102 (H) 70 - 99 mg/dL   BUN 16 6 - 20 mg/dL   Creatinine, Ser 0.80 0.44 - 1.00 mg/dL   Calcium 8.9 8.9 - 10.3 mg/dL   GFR calc non Af Amer >60 >60 mL/min   GFR calc Af Amer >60 >60 mL/min   Anion gap 7 5 - 15  CBC     Status: Abnormal   Collection Time: 03/24/18 10:34 AM  Result Value Ref Range   WBC 14.8 (H) 3.6 - 11.0 K/uL   RBC 5.01 3.80 - 5.20 MIL/uL   Hemoglobin 13.4 12.0 - 16.0 g/dL   HCT 40.8 35.0 - 47.0 %   MCV 81.4 80.0 - 100.0 fL   MCH 26.7 26.0 - 34.0 pg   MCHC 32.8 32.0 - 36.0 g/dL   RDW 14.8 (H) 11.5 - 14.5 %   Platelets 475 (H) 150 - 440 K/uL  Troponin I  Status: None   Collection Time: 03/24/18 10:34 AM  Result Value Ref Range   Troponin I <0.03 <0.03 ng/mL  hCG, quantitative, pregnancy     Status: None   Collection Time: 03/24/18 10:34 AM  Result Value Ref Range   hCG, Beta Chain, Quant, S <1 <5 mIU/mL  Troponin I     Status: None   Collection Time: 03/24/18  1:35 PM  Result Value Ref Range   Troponin I <0.03 <0.03 ng/mL   ____________________________________________  EKG My review and personal interpretation at Time: 10:26   Indication: chest pain  Rate: 80  Rhythm: sinus Axis: normal Other: normal intervals, no stemi ____________________________________________  RADIOLOGY  I personally reviewed all radiographic images ordered to evaluate for the above acute complaints and reviewed radiology reports and findings.  These findings were personally discussed with the patient.  Please see medical record for radiology report.  ____________________________________________   PROCEDURES  Procedure(s) performed:  Procedures    Critical Care performed: no ____________________________________________   INITIAL IMPRESSION / ASSESSMENT AND PLAN / ED COURSE  Pertinent labs &  imaging results that were available during my care of the patient were reviewed by me and considered in my medical decision making (see chart for details).   DDX: ACS, pericarditis, esophagitis, boerhaaves, pe, dissection, pna, bronchitis, costochondritis   Scotty T Thibeau is a 32 y.o. who presents to the ED with symptoms as described above.  Seems primarily muscular skeletal in nature.  Patient low risk heart score.  EKG negative for acute ischemia.  Initial troponin is negative.  We will repeat enzyme to further risk stratify.  She is low risk by Wells criteria and is PERC negative.  Does have mild leukocytosis but no evidence of pneumonia.  Her abdominal exam is soft and benign.  No evidence of shingles mastitis.  Patient stable and appropriate for outpatient follow-up.  Have discussed with the patient and available family all diagnostics and treatments performed thus far and all questions were answered to the best of my ability. The patient demonstrates understanding and agreement with plan.   Clinical Course as of Mar 24 1502  Mon Mar 24, 2018  1435 Repeat troponin negative.  At this point do believe patient stable and appropriate for outpatient follow-up.   [PR]    Clinical Course User Index [PR] Merlyn Lot, MD     As part of my medical decision making, I reviewed the following data within the [facility] notes reviewed and incorporated, Labs reviewed, notes from prior ED visits and [place] Controlled Substance Database   ____________________________________________   FINAL CLINICAL IMPRESSION(S) / ED DIAGNOSES  Final diagnoses:  Chest wall pain      NEW MEDICATIONS STARTED DURING THIS VISIT:  New Prescriptions   CYCLOBENZAPRINE (FLEXERIL) 10 MG TABLET    Take 1 tablet (10 mg total) by mouth 3 (three) times daily as needed for muscle spasms.   NAPROXEN (NAPROSYN) 500 MG TABLET    Take 1 tablet (500 mg total) by mouth 2 (two) times daily with a  meal.     Note:  This document was prepared using Dragon voice recognition software and may include unintentional dictation errors.    Merlyn Lot, MD 03/24/18 1327    Merlyn Lot, MD 03/24/18 [phone]

## 2018-03-25 NOTE — Telephone Encounter (Signed)
Lmov for patient to call back and schedule appointment ° ° °

## 2018-03-25 NOTE — Telephone Encounter (Signed)
Patient scheduled for 03/27/18  Nothing else needed.

## 2018-03-27 ENCOUNTER — Encounter: Payer: Self-pay | Admitting: Family Medicine

## 2018-03-27 ENCOUNTER — Telehealth: Payer: Self-pay | Admitting: Cardiovascular Disease

## 2018-03-27 ENCOUNTER — Encounter: Payer: Self-pay | Admitting: *Deleted

## 2018-03-27 ENCOUNTER — Encounter: Payer: Self-pay | Admitting: Nurse Practitioner

## 2018-03-27 ENCOUNTER — Ambulatory Visit (INDEPENDENT_AMBULATORY_CARE_PROVIDER_SITE_OTHER): Payer: BLUE CROSS/BLUE SHIELD | Admitting: Nurse Practitioner

## 2018-03-27 VITALS — BP 128/90 | HR 88 | Ht 61.0 in | Wt 316.8 lb

## 2018-03-27 DIAGNOSIS — I493 Ventricular premature depolarization: Secondary | ICD-10-CM | POA: Diagnosis not present

## 2018-03-27 DIAGNOSIS — R002 Palpitations: Secondary | ICD-10-CM | POA: Diagnosis not present

## 2018-03-27 DIAGNOSIS — R0789 Other chest pain: Secondary | ICD-10-CM | POA: Diagnosis not present

## 2018-03-27 DIAGNOSIS — I471 Supraventricular tachycardia: Secondary | ICD-10-CM

## 2018-03-27 MED ORDER — METOPROLOL TARTRATE 25 MG PO TABS: 37.5000 mg | ORAL_TABLET | Freq: Two times a day (BID) | ORAL | 3 refills | Status: DC

## 2018-03-27 NOTE — Progress Notes (Signed)
FMLA Form Completion: Received FMLA paperwork for patient initial request  Company: [employer]  Part A: Background Information: 1. Approximate date of onset for condition requesting leave - 03/24/18 2. Probable duration of patient's condition: >6 months, chronic 3. Dates you have treated patient: 02/19/18  Part B: Serious Health Condition: Inpatient Care - No Pregnancy - No Prescribed medication for condition other than OTC? - Yes Chronic Condition     - Yes (requires at least 2 visits per year for treatment)  Episodic Flare-ups - Yes - pain limiting prolonged sitting and ambulation Unable to perform job function with flare up only  Part C: Amount and Type of Leave Needed 1. Continuous Leave - No 2. Reduced Schedule - No 3. Intermittent Leave - Yes       - Start Date / End Date - 03/24/18 - approx 6 months, 09/23/18       - Estimated Frequency for treatments - 4-8 hours per visit/treatment, up to 4 per 1 month. (note - PT or short visit up to 4 hours, and doctors visit out of town [facility] up to 8 hours or 1 day)  - Estimated frequency for episodes of incapacity - 1 day per episode, 2 episodes per 1 month approx  Clinical information: #10 - Diagnosis w/ sacroilitis, rheumatoid unspecified condition, painful flare-ups with low back pain, has had imaging x-ray, MRI pelvis 12/2017 most recent, followed by [facility], on steroid therapy and NSAID and other future plans for biologic agents and Physical Therapy PT  Contacted patient today 03/27/18, reviewed my completion of this document with her at this time.  Completed, signed, and dated FMLA paperwork. To be scanned into chart and submitted via fax.  Nobie Putnam, DO [facility] 03/27/2018, 2:49 PM

## 2018-03-27 NOTE — Progress Notes (Signed)
Office Visit    Patient Name: Leah Osborne Date of Encounter: 03/27/2018  Primary Care Provider:  Olin Hauser, DO Primary Cardiologist:  Ida Rogue, MD  Chief Complaint    32 year old female with a history of atypical chest pain, hypertension, hyperlipidemia, palpitations with documented PVCs and short runs of atrial tachycardia, who presents for follow-up related to chest pain.  Past Medical History    Past Medical History:  Diagnosis Date  . Abnormal Pap smear of cervix   . Arthritis   . Bipolar 1 disorder ([place])   . Chest pain   . Headache    MIGRAINES  . Hyperlipidemia   . Hypertension   . Palpitations    a. 09/2017 Zio Monitor: predominantly RSR, avg HR 87, 11 Atach/SVT episodes, fasted 176 x 7beats, longegst 9 beats. Rare PAC's, occas PVC's, rare couplets/triplets.  Marland Kitchen PTSD (post-traumatic stress disorder)   . PVC (premature ventricular contraction)   . Social phobia    Past Surgical History:  Procedure Laterality Date  . CHOLECYSTECTOMY    . LAPAROSCOPIC TUBAL LIGATION Bilateral 09/19/2015   Procedure: LAPAROSCOPIC BILATERAL TUBAL BANDING;  Surgeon: Brayton Mars, MD;  Location: [facility] ORS;  Service: Gynecology;  Laterality: Bilateral;    Allergies  No Known Allergies  History of Present Illness    32 year old female with the above past medical history including atypical chest pain, hypertension, hypovolemia, palpitations, migraines, sleep apnea, and bipolar disorder.  She had previously been evaluated for tachypalpitations with Zio monitor in April.  She did have symptoms during monitoring.  And this test was finally read in August, showing short runs of atrial tachycardia left parenthesis longest 9 beats), along with intermittent PVCs and episodes of bigeminy and trigeminy.  She has been on beta-blocker and is currently taking Lopressor 25 mg twice daily.  With this dose, she does experience what she describes as breakthrough  palpitations multiple times per day.  Symptoms only last a few seconds but she says they distract her and she has difficulty focusing on other tasks in those settings.  She does not typically experience chest pain, dyspnea, or presyncope palpitations.  She is interested in titrating her beta-blocker dose.  She was in her usual state of health until the evening of Sunday, October 6, when she was lying in bed and had sudden onset of severe chest heaviness.  She sat up and the heaviness resolved within 5 seconds.  When she laid back down, she noted a discomfort in her left chest that seem to be worsened by laying on her left side.  This was also tender.  Because of persistent discomfort, she presented to the emergency department where ECG was nonacute and troponin was normal x2.  Lab work was otherwise unrevealing and she was discharged home and advised to follow-up with cardiology.  She has continued to have chest wall tenderness, focally over her left breast which does not limit activities.  She thinks she is having a flare of her rheumatoid arthritis.  She denies dyspnea, PND, orthopnea, dizziness, syncope, edema, or early satiety.  Home Medications    Prior to Admission medications   Medication Sig Start Date End Date Taking? Authorizing Provider  celecoxib (CELEBREX) 100 MG capsule Take 100 mg by mouth 2 (two) times daily.    [provider]  cyclobenzaprine (FLEXERIL) 10 MG tablet Take 1 tablet (10 mg total) by mouth 3 (three) times daily as needed for muscle spasms. 03/24/18   Merlyn Lot, MD  levonorgestrel (MIRENA) 20 MCG/24HR IUD 1 each by Intrauterine route once. 11/2016    [provider]  levothyroxine (SYNTHROID, LEVOTHROID) 25 MCG tablet Take 1 tablet (25 mcg total) by mouth daily before breakfast. 07/22/17   Parks Ranger, Devonne Doughty, DO  metoprolol tartrate (LOPRESSOR) 25 MG tablet Take 1 tablet (25 mg total) by mouth 2 (two) times daily. 01/06/18 04/06/18  Minna Merritts, MD  metoprolol tartrate (LOPRESSOR) 25 MG tablet Take 1 tablet (25 mg total) by mouth 2 (two) times daily. 02/24/18 05/25/18  Minna Merritts, MD  naproxen (NAPROSYN) 500 MG tablet Take 1 tablet (500 mg total) by mouth 2 (two) times daily with a meal. 03/24/18 03/24/19  Merlyn Lot, MD  omeprazole (PRILOSEC) 40 MG capsule Take 40 mg by mouth daily.    [provider]    Review of Systems    Left upper chest focal discomfort and tenderness present since October 6.  She also has intermittent tachypalpitations which are generally very brief but bothersome.  She denies dyspnea, PND, orthopnea, dizziness, syncope, edema, or early satiety.  All other systems reviewed and are otherwise negative except as noted above.  Physical Exam    VS:  BP 128/90 (BP Location: Left Arm, Patient Position: Sitting, Cuff Size: Large)   Pulse 88   Ht 5\' 1"  (1.549 m)   Wt (!) 316 lb 12 oz (143.7 kg)   LMP 03/03/2018 (Approximate)   BMI 59.85 kg/m  , BMI Body mass index is 59.85 kg/m. GEN: Obese, in no acute distress. HEENT: normal. Neck: Supple, obese, difficult to gauge JVP.  No carotid bruits, or masses. Cardiac: RRR, no murmurs, rubs, or gallops. No clubbing, cyanosis, edema.  Radials/DP/PT 2+ and equal bilaterally.  Focal left chest tenderness above the left breast. Respiratory:  Respirations regular and unlabored, clear to auscultation bilaterally. GI: Soft, nontender, nondistended, BS + x 4. MS: no deformity or atrophy. Skin: warm and dry, no rash. Neuro:  Strength and sensation are intact. Psych: Normal affect.  Accessory Clinical Findings    ECG personally reviewed by me today -sinus arrhythmia with PVC, 87, no acute ST or T changes.  Assessment & Plan    1.  Musculoskeletal chest pain: Patient developed chest discomfort and left upper chest tenderness beginning on October 6.  She was seen in the emergency department with negative evaluation including normal ECG and troponins.  She  has continued to have chest wall tenderness which is fairly focal and just above her left breast since then.  This does not limit activities and there are no associated symptoms.  She is tender on examination today.  She suspects that this may be related to a rheumatoid arthritis flare.  Her white count was mildly elevated during ER visit the chest x-ray was normal.  There are no pleuritic symptoms or EKG findings to suggest pericarditis.  Chest pain appears to be noncardiac.  Given her family history, we did discuss options for screening for coronary artery disease such as stress testing or cardiac CT for coronary calcium.  I also reviewed her CT scans that she previously had at [facility] though these were all abdominal and pelvic and the coronary arteries could not be seen.  At this time, given atypical symptoms we collectively agreed that there was no indication for cardiac testing at that time.  She will consider cardiac CT for calcium scoring in the future.  2.  Palpitations/atrial tachycardia/PVCs: This is been somewhat of a long-standing issue and previously noted  on Zio monitoring.  Patient wore the monitor in April but did not send it back into [place] until August.  This did show brief runs of atrial tachycardia as well as frequent PVCs.  She is currently on metoprolol 25 g twice daily and does still experience occasional palpitations which are bothersome as they distract her and she loses focus.  She is interested in further titration of beta-blocker and will increase to 37.5 mg twice daily.  She will contact us if palpitations persist, at which point we can consider going up to 50 twice daily.  3.  Disposition: Follow-up in 6 months or sooner if necessary.  Murray Hodgkins, NP 03/27/2018, 9:37 AM

## 2018-03-27 NOTE — Patient Instructions (Addendum)
Medication Instructions: Increase Metoprolol to 37.5mg  by mouth twice a day. (1 1/2)    Labwork: None     Testing/Procedures: none ordered     Follow-Up: Your physician wants you to follow-up in: 6 months with Dr. Rockey Situ. You will receive a reminder letter in the mail two months in advance. If you don't receive a letter, please call our office to schedule the follow-up appointment.      Any Other Special Instructions Will Be Listed Below (If Applicable).     If you need a refill on your cardiac medications before your next appointment, please call your pharmacy.

## 2018-03-27 NOTE — Telephone Encounter (Signed)
Patient calling stating she was just seen by Ignacia Bayley  But forgot to ask for a letter for work that she was here today She is asking, if we can please upload this into her Mychart and she can print this  Please advise

## 2018-03-27 NOTE — Telephone Encounter (Signed)
Letter sent to patient's MyChart 

## 2018-04-11 ENCOUNTER — Telehealth: Payer: Self-pay | Admitting: Cardiovascular Disease

## 2018-04-11 ENCOUNTER — Ambulatory Visit: Admit: 2018-04-11 | Discharge: 2018-04-12 | Payer: MEDICAID | Attending: Obesity Medicine | Primary: Obesity Medicine

## 2018-04-11 ENCOUNTER — Ambulatory Visit: Admit: 2018-04-11 | Discharge: 2018-04-12 | Payer: MEDICAID | Attending: Registered" | Primary: Registered"

## 2018-04-11 DIAGNOSIS — G43009 Migraine without aura, not intractable, without status migrainosus: Principal | ICD-10-CM

## 2018-04-11 DIAGNOSIS — G4733 Obstructive sleep apnea (adult) (pediatric): Secondary | ICD-10-CM

## 2018-04-11 DIAGNOSIS — Z6841 Body Mass Index (BMI) 40.0 and over, adult: Principal | ICD-10-CM

## 2018-04-11 DIAGNOSIS — E559 Vitamin D deficiency, unspecified: Secondary | ICD-10-CM | POA: Diagnosis not present

## 2018-04-11 DIAGNOSIS — D509 Iron deficiency anemia, unspecified: Secondary | ICD-10-CM | POA: Diagnosis not present

## 2018-04-11 DIAGNOSIS — F319 Bipolar disorder, unspecified: Secondary | ICD-10-CM | POA: Diagnosis not present

## 2018-04-11 DIAGNOSIS — Z79899 Other long term (current) drug therapy: Secondary | ICD-10-CM | POA: Diagnosis not present

## 2018-04-11 DIAGNOSIS — I10 Essential (primary) hypertension: Secondary | ICD-10-CM | POA: Diagnosis not present

## 2018-04-11 DIAGNOSIS — M461 Sacroiliitis, not elsewhere classified: Secondary | ICD-10-CM | POA: Diagnosis not present

## 2018-04-11 DIAGNOSIS — G43909 Migraine, unspecified, not intractable, without status migrainosus: Secondary | ICD-10-CM | POA: Diagnosis not present

## 2018-04-11 MED ORDER — METFORMIN ER 500 MG TABLET,EXTENDED RELEASE 24 HR
ORAL_TABLET | Freq: Every morning | ORAL | 3 refills | 0.00000 days | Status: CP
Start: 2018-04-11 — End: 2018-05-27

## 2018-04-11 NOTE — Telephone Encounter (Signed)
STAT if HR is under 50 or over 120 (normal HR is 60-100 beats per minute)  1) What is your heart rate? Took while on the phone - 105   2) Do you have a log of your heart rate readings (document readings)?  The past few days it has been 41, 42 and 44 then sometimes it goes up to 100s  3) Do you have any other symptoms? Really bad headache   Patient would like to schedule an appointment but would like sooner than the next available 11/20 Please call to discuss

## 2018-04-11 NOTE — Telephone Encounter (Signed)
Spoke with patient. She was last here on 03/27/18. Berge increased her metoprolol to 37.5 mg two times a day. Patient did this for about 5 days and was still having palpitations.  She decided to increase to 50 mg BID which was discussed as a possibility if patient's symptoms continued. She was advised to call us.  For the past 3 days, patient has had times where her heart rate watch has registered her heart rate to be in the 40s. She also has felt lightheaded and faint during this time. Patient complains of a headache as well. She takes tylenol 1000 mg BID regularly. Discussed with Ignacia Bayley, NP. He advised for patient to decrease back to 37.5 mg BID and continue to monitor symptoms. Patient verbalized understanding and will let us know if this does not work and call back for further advice.

## 2018-04-15 ENCOUNTER — Ambulatory Visit
Admission: EM | Admit: 2018-04-15 | Discharge: 2018-04-15 | Disposition: A | Discharge status: Home / Self Care | Payer: BLUE CROSS/BLUE SHIELD | Attending: Family Medicine | Admitting: Family Medicine

## 2018-04-15 ENCOUNTER — Other Ambulatory Visit: Payer: Self-pay

## 2018-04-15 DIAGNOSIS — T788XXA Other adverse effects, not elsewhere classified, initial encounter: Secondary | ICD-10-CM | POA: Insufficient documentation

## 2018-04-15 DIAGNOSIS — R112 Nausea with vomiting, unspecified: Secondary | ICD-10-CM | POA: Diagnosis not present

## 2018-04-15 DIAGNOSIS — F314 Bipolar disorder, current episode depressed, severe, without psychotic features: Secondary | ICD-10-CM | POA: Insufficient documentation

## 2018-04-15 DIAGNOSIS — F4310 Post-traumatic stress disorder, unspecified: Secondary | ICD-10-CM | POA: Insufficient documentation

## 2018-04-15 DIAGNOSIS — R197 Diarrhea, unspecified: Secondary | ICD-10-CM | POA: Insufficient documentation

## 2018-04-15 DIAGNOSIS — Z7984 Long term (current) use of oral hypoglycemic drugs: Secondary | ICD-10-CM | POA: Insufficient documentation

## 2018-04-15 DIAGNOSIS — Z79890 Hormone replacement therapy (postmenopausal): Secondary | ICD-10-CM | POA: Insufficient documentation

## 2018-04-15 DIAGNOSIS — T887XXA Unspecified adverse effect of drug or medicament, initial encounter: Secondary | ICD-10-CM | POA: Diagnosis not present

## 2018-04-15 DIAGNOSIS — Z79899 Other long term (current) drug therapy: Secondary | ICD-10-CM | POA: Diagnosis not present

## 2018-04-15 DIAGNOSIS — X58XXXA Exposure to other specified factors, initial encounter: Secondary | ICD-10-CM | POA: Insufficient documentation

## 2018-04-15 DIAGNOSIS — R51 Headache: Secondary | ICD-10-CM | POA: Diagnosis not present

## 2018-04-15 DIAGNOSIS — I10 Essential (primary) hypertension: Secondary | ICD-10-CM | POA: Insufficient documentation

## 2018-04-15 LAB — GLUCOSE, CAPILLARY: GLUCOSE-CAPILLARY: 76 mg/dL (ref 70–99)

## 2018-04-15 MED ORDER — ONDANSETRON 8 MG PO TBDP
8.0000 mg | ORAL_TABLET | Freq: Once | ORAL | Status: AC
  Administered 2018-04-15: 8 mg via ORAL

## 2018-04-15 MED ORDER — ONDANSETRON 8 MG PO TBDP: 8.0000 mg | ORAL_TABLET | Freq: Two times a day (BID) | ORAL | 0 refills | Status: DC

## 2018-04-15 MED ORDER — ONDANSETRON HCL 4 MG/2ML IJ SOLN: 8.0000 mg | Freq: Once | INTRAMUSCULAR | Status: DC

## 2018-04-15 NOTE — ED Provider Notes (Signed)
[facility]    CSN: [id number] Arrival date & time: 04/15/18  1329     History   Chief Complaint Chief Complaint  Patient presents with  . Migraine    HPI Leah Osborne is a 32 y.o. female.   HPI  -year-old female complains of a headache that started on Sunday.  States that it is in her forehead.  She is had no aura has not had a throbbing quality.  She is calling in a headache because of the amount of time that she has had a.  Today she started having nausea and vomiting as well as diarrhea.  She relates that on Friday she was prescribed metformin 500 mg once daily and her first dose on Saturday.  Doing well until today.  States she has had several meals + at home but does not think that this is what caused her nausea vomiting and diarrhea.  She is had no fever or chills.  3 but without blood or mucus.  Has had mild abdominal pain which indicates the suprapubic lower quadrant.  Is described as a cramping quality that is much relieved after having a bowel movement.  Placed on metformin due to prediabetes she states her A1c's were in the low sixes.  CBG taken today was 76          Past Medical History:  Diagnosis Date  . Abnormal Pap smear of cervix   . Arthritis   . Bipolar 1 disorder ([place])   . Chest pain   . Headache    MIGRAINES  . Hyperlipidemia   . Hypertension   . Palpitations    a. 09/2017 Zio Monitor: predominantly RSR, avg HR 87, 11 Atach/SVT episodes, fasted 176 x 7beats, longegst 9 beats. Rare PAC's, occas PVC's, rare couplets/triplets.  Marland Kitchen PTSD (post-traumatic stress disorder)   . PVC (premature ventricular contraction)   . Social phobia     Patient Active Problem List   Diagnosis Date Noted  . Elevated hemoglobin A1c 02/12/2018  . Dyslipidemia 02/05/2018  . Abnormal skin growth 11/15/2016  . PTSD (post-traumatic stress disorder) 10/24/2016  . Severe bipolar I disorder, most recent episode depressed without psychotic features ([place])  10/23/2016  . Ankylosing spondylitis ([place]) 10/23/2016  . Sacroiliac inflammation ([place]) 08/02/2016  . Hypertension 05/28/2016  . Vitamin D deficiency 05/02/2016  . 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 Hypothyroidism 02/13/2016  . Anxiety and depression 01/23/2016  . Status post tubal ligation 09/22/2015  . Morbid (severe) obesity due to excess calories ([place]) 04/25/2015  . Anemia, iron deficiency 01/04/2015  . Headache, migraine 01/04/2015  . Abnormal cytological findings in female genital organs 07/04/2011    Past Surgical History:  Procedure Laterality Date  . CHOLECYSTECTOMY    . LAPAROSCOPIC TUBAL LIGATION Bilateral 09/19/2015   Procedure: LAPAROSCOPIC BILATERAL TUBAL BANDING;  Surgeon: Brayton Mars, MD;  Location: [facility] ORS;  Service: Gynecology;  Laterality: Bilateral;    OB History    Gravida  2   Para  2   Term  2   Preterm  0   AB  0   Living  2     SAB  0   TAB  0   Ectopic  0   Multiple  0   Live Births  2            Home Medications    Prior to Admission medications   Medication Sig Start Date End Date Taking? Authorizing Provider  celecoxib (CELEBREX) 100 MG capsule Take 100 mg by mouth 2 (two) times daily.   Yes [provider]  levothyroxine (SYNTHROID, LEVOTHROID) 25 MCG tablet Take 1 tablet (25 mcg total) by mouth daily before breakfast. 07/22/17  Yes Karamalegos, Devonne Doughty, DO  metFORMIN (GLUCOPHAGE-XR) 500 MG 24 hr tablet  04/11/18  Yes [provider]  metoprolol tartrate (LOPRESSOR) 25 MG tablet Take 1.5 tablets (37.5 mg total) by mouth 2 (two) times daily. 03/27/18  Yes Theora Gianotti, NP  omeprazole (PRILOSEC) 40 MG capsule Take 40 mg by mouth daily.   Yes [provider]  ondansetron (ZOFRAN ODT) 8 MG disintegrating tablet Take 1 tablet (8 mg total) by mouth 2 (two) times daily. 04/15/18   Lorin Picket, PA-C    Family History Family History  Problem  Relation Age of Onset  . Diabetes Maternal Grandmother   . Heart disease Maternal Grandmother   . Heart failure Maternal Grandmother   . Heart attack Maternal Grandmother   . Rheum arthritis Mother   . Healthy Father   . Heart Problems Maternal Grandfather   . Atrial fibrillation Paternal Grandmother   . Cancer Neg Hx     Social History Social History   Tobacco Use  . Smoking status: Never Smoker  . Smokeless tobacco: Never Used  Substance Use Topics  . Alcohol use: Not Currently    Alcohol/week: 0.0 standard drinks  . Drug use: No     Allergies   Patient has no known allergies.   Review of Systems Review of Systems  Constitutional: Positive for activity change and appetite change. Negative for chills, fatigue and fever.  Gastrointestinal: Positive for abdominal pain, diarrhea, nausea and vomiting.  Neurological: Positive for headaches.  All other systems reviewed and are negative.    Physical Exam Triage Vital Signs ED Triage Vitals  Enc Vitals Group     BP 04/15/18 1344 (!) 139/96     Pulse Rate 04/15/18 1344 84     Resp 04/15/18 1344 18     Temp 04/15/18 1344 98.4 F (36.9 C)     Temp Source 04/15/18 1344 Oral     SpO2 04/15/18 1344 100 %     Weight 04/15/18 1342 (!) 321 lb (145.6 kg)     Height 04/15/18 1342 5\' 1"  (1.549 m)     Head Circumference --      Peak Flow --      Pain Score 04/15/18 1342 6     Pain Loc --      Pain Edu? --      Excl. in [place]? --    No data found.  Updated Vital Signs BP (!) 139/96 (BP Location: Left Arm)   Pulse 84   Temp 98.4 F (36.9 C) (Oral)   Resp 18   Ht 5\' 1"  (1.549 m)   Wt (!) 321 lb (145.6 kg)   LMP 04/06/2018   SpO2 100%   BMI 60.65 kg/m   Visual Acuity Right Eye Distance:   Left Eye Distance:   Bilateral Distance:    Right Eye Near:   Left Eye Near:    Bilateral Near:     Physical Exam  Constitutional: She is oriented to person, place, and time. She appears well-developed and well-nourished. No  distress.  Patient is morbidly obese  HENT:  Head: Normocephalic.  Right Ear: External ear normal.  Left Ear: External ear normal.  Nose: Nose normal.  Mouth/Throat: Oropharynx is clear and moist. No oropharyngeal  exudate.  Eyes: Pupils are equal, round, and reactive to light. EOM are normal. Right eye exhibits no discharge. Left eye exhibits no discharge. No scleral icterus.  Neck: Normal range of motion. Neck supple.  Pulmonary/Chest: Effort normal and breath sounds normal.  Abdominal: Soft. Bowel sounds are normal. She exhibits no mass. There is tenderness. There is no rebound and no guarding.  Tenderness is in the lower quadrants left right and suprapubic  Musculoskeletal: Normal range of motion.  Neurological: She is alert and oriented to person, place, and time.  Skin: Skin is warm and dry. She is not diaphoretic.  Psychiatric: She has a normal mood and affect. Her behavior is normal. Judgment and thought content normal.  Nursing note and vitals reviewed.    UC Treatments / Results  Labs (all labs ordered are listed, but only abnormal results are displayed) Labs Reviewed  GLUCOSE, CAPILLARY  CBG MONITORING, ED    EKG None  Radiology No results found.  Procedures Procedures (including critical care time)  Medications Ordered in UC Medications  ondansetron (ZOFRAN-ODT) disintegrating tablet 8 mg (8 mg Oral Given 04/15/18 1418)   Patient's nausea improved after the Zofran and dosing. Able To tolerate 2 bottles of water without vomiting. Initial Impression / Assessment and Plan / UC Course  I have reviewed the triage vital signs and the nursing notes.  Pertinent labs & imaging results that were available during my care of the patient were reviewed by me and considered in my medical decision making (see chart for details).     Discussed with the patient that this could possibly be a reaction to her metformin.  It does not appear that she has a gastroenteritis from a  infectious source.  Her blood sugar today was 76.  I recommended that she contact her endocrinologist to discuss the metformin to see if any adjustments are recommended.  In the meantime I have given her prescription for Zofran so she may continue with hydration while she has the nausea vomiting diarrhea.  When she does start solid foods again she should start with a BRAT diet and advance as tolerated.  If she worsens I recommend she be seen in the emergency room Final Clinical Impressions(s) / UC Diagnoses   Final diagnoses:  Non-dose-related adverse effect of medication, initial encounter  Non-intractable vomiting with nausea, unspecified vomiting type  Diarrhea, unspecified type     Discharge Instructions     Contact your endocrinologist regarding metformin and possible side effects.  Take the Zofran to enable you to drink more fluids.  Up with your endocrinologist and primary care physician as soon as possible    ED Prescriptions    Medication Sig Dispense Auth. Provider   ondansetron (ZOFRAN ODT) 8 MG disintegrating tablet Take 1 tablet (8 mg total) by mouth 2 (two) times daily. 6 tablet Lorin Picket, PA-C     Controlled Substance Prescriptions [place] Controlled Substance Registry consulted? Not Applicable   Lorin Picket, PA-C 04/15/18 1513

## 2018-04-15 NOTE — Discharge Instructions (Addendum)
Contact your endocrinologist regarding metformin and possible side effects.  Take the Zofran to enable you to drink more fluids.  Up with your endocrinologist and primary care physician as soon as possible

## 2018-04-15 NOTE — ED Triage Notes (Signed)
Patient complains of migraine that started on Sunday. Patient states that she has been having nausea with vomiting that started today.

## 2018-04-17 ENCOUNTER — Encounter: Payer: Self-pay | Admitting: Family Medicine

## 2018-04-17 ENCOUNTER — Ambulatory Visit (INDEPENDENT_AMBULATORY_CARE_PROVIDER_SITE_OTHER): Payer: BLUE CROSS/BLUE SHIELD | Admitting: Family Medicine

## 2018-04-17 VITALS — BP 123/74 | HR 61 | Temp 98.2°F | Resp 16 | Ht 61.0 in | Wt 321.0 lb

## 2018-04-17 DIAGNOSIS — Z23 Encounter for immunization: Secondary | ICD-10-CM

## 2018-04-17 DIAGNOSIS — M450 Ankylosing spondylitis of multiple sites in spine: Secondary | ICD-10-CM | POA: Diagnosis not present

## 2018-04-17 DIAGNOSIS — F4310 Post-traumatic stress disorder, unspecified: Secondary | ICD-10-CM

## 2018-04-17 DIAGNOSIS — R7309 Other abnormal glucose: Secondary | ICD-10-CM

## 2018-04-17 DIAGNOSIS — M461 Sacroiliitis, not elsewhere classified: Secondary | ICD-10-CM

## 2018-04-17 NOTE — Patient Instructions (Addendum)
Thank you for coming to the office today.  Labs today for screening for Lupus, stand by for results, will notify you and we may discuss with Rheumatology in future if needed or question.  Continue with your [facility] now as you said monthly for monitoring and nutrition  Plan to follow along with them and consider the medication options and in future I think it is wise to pursue options available to you such as possible surgery.  Future will re-check sugar  Try again on Metformin low dose XR 500mg  daily with food when feeling better, can try EVERY OTHER day to start if need  Please schedule a Follow-up Appointment to: Return in about 6 months (around 10/16/2018) for PreDM A1c, Weight.  If you have any other questions or concerns, please feel free to call the office or send a message through [place]. You may also schedule an earlier appointment if necessary.  Additionally, you may be receiving a survey about your experience at our office within a few days to 1 week by e-mail or mail. We value your feedback.  Nobie Putnam, DO [place]

## 2018-04-17 NOTE — Progress Notes (Signed)
Subjective:    Patient ID: Leah Osborne, female    DOB: 1985/10/19, 32 y.o.   MRN: [id number]  Leah Osborne is a 32 y.o. female presenting on 04/17/2018 for Obesity   HPI  Morbid Obesity BMI >60 / Elevated A1c vs Pre-Diabetes Last labs done in 01/2018 reviewed, she had elevated A1c up to 6.0. Recent updates she has followed up with [facility] in 03/2018, they have initiated her on Metformin XR 522m daily, she could not tolerate initial first 3 doses, had nausea vomiting and went to urgent care, ultimately thought maybe some sick contact possibly viral GI bug as well, as other sick contacts at home same time with similar symptoms. She is interested to re-attempt this medication. - [facility], needs to treat Bipolar better and may need abilify again, however this would cause some wt gain. She plans to proceed as recommended - In future they will consider rx medication wt loss and also continue along with her for a 6 month weight change plan and monitor her monthly. In future she may schedule with bariatric surgery consult if indicated - She has no new concerns today and reports she is looking forward to this opportunity for weight loss to help her other chronic medical conditions  Additionally followed by Rheumatology, she has complaints of multiple joint pain, also facial rash erythema and family members with lupus, she is requesting lupus testing and she will follow-up with Rheum as planned.  Health Maintenance: Due for Flu Shot, will receive today    [facility] 04/17/2018 02/19/2018 05/28/2016  Decreased Interest 0 0 0  Down, Depressed, Hopeless 0 0 0  PHQ - 2 Score 0 0 0  Altered sleeping 0 0 0  Tired, decreased energy 0 0 3  Change in appetite 0 0 0  Feeling bad or failure about yourself  0 0 0  Trouble concentrating 0 0 0  Moving slowly or fidgety/restless 0 0 1    Suicidal thoughts 0 0 0  PHQ-9 Score 0 0 4  Difficult doing work/chores - - -    Past Medical History:  Diagnosis Date  . Abnormal Pap smear of cervix   . Arthritis   . Bipolar 1 disorder ([place]   . Chest pain   . Headache    MIGRAINES  . Hyperlipidemia   . Hypertension   . Palpitations    a. 09/2017 Zio Monitor: predominantly RSR, avg HR 87, 11 Atach/SVT episodes, fasted 176 x 7beats, longegst 9 beats. Rare PAC's, occas PVC's, rare couplets/triplets.  .Marland KitchenPTSD (post-traumatic stress disorder)   . PVC (premature ventricular contraction)   . Social phobia    Past Surgical History:  Procedure Laterality Date  . CHOLECYSTECTOMY    . LAPAROSCOPIC TUBAL LIGATION Bilateral 09/19/2015   Procedure: LAPAROSCOPIC BILATERAL TUBAL BANDING;  Surgeon: MBrayton Mars MD;  Location: [facility] ORS;  Service: Gynecology;  Laterality: Bilateral;   Social History   Socioeconomic History  . Marital status: Married    Spouse name: Not on file  . Number of children: Not on file  . Years of education: Not on file  . Highest education level: Not on file  Occupational History  . Not on file  Social Needs  . Financial resource strain: Not on file  . Food insecurity:    Worry: Not on file    Inability: Not on file  . Transportation needs:  Medical: Not on file    Non-medical: Not on file  Tobacco Use  . Smoking status: Never Smoker  . Smokeless tobacco: Never Used  Substance and Sexual Activity  . Alcohol use: Not Currently    Alcohol/week: 0.0 standard drinks  . Drug use: No  . Sexual activity: Not on file  Lifestyle  . Physical activity:    Days per week: Not on file    Minutes per session: Not on file  . Stress: Not on file  Relationships  . Social connections:    Talks on phone: Not on file    Gets together: Not on file    Attends religious service: Not on file    Active member of club or organization: Not on file    Attends meetings of clubs or organizations: Not on file     Relationship status: Not on file  . Intimate partner violence:    Fear of current or ex partner: Not on file    Emotionally abused: Not on file    Physically abused: Not on file    Forced sexual activity: Not on file  Other Topics Concern  . Not on file  Social History Narrative  . Not on file   Family History  Problem Relation Age of Onset  . Diabetes Maternal Grandmother   . Heart disease Maternal Grandmother   . Heart failure Maternal Grandmother   . Heart attack Maternal Grandmother   . Rheum arthritis Mother   . Healthy Father   . Heart Problems Maternal Grandfather   . Atrial fibrillation Paternal Grandmother   . Cancer Neg Hx    Current Outpatient Medications on File Prior to Visit  Medication Sig  . celecoxib (CELEBREX) 100 MG capsule Take 100 mg by mouth 2 (two) times daily.  Marland Kitchen levothyroxine (SYNTHROID, LEVOTHROID) 25 MCG tablet Take 1 tablet (25 mcg total) by mouth daily before breakfast.  . metoprolol tartrate (LOPRESSOR) 25 MG tablet Take 1.5 tablets (37.5 mg total) by mouth 2 (two) times daily.  Marland Kitchen omeprazole (PRILOSEC) 40 MG capsule Take 40 mg by mouth daily.  . ondansetron (ZOFRAN ODT) 8 MG disintegrating tablet Take 1 tablet (8 mg total) by mouth 2 (two) times daily. (Patient taking differently: Take 8 mg by mouth every 8 (eight) hours as needed. )  . metFORMIN (GLUCOPHAGE-XR) 500 MG 24 hr tablet    No current facility-administered medications on file prior to visit.     Review of Systems Per HPI unless specifically indicated above     Objective:    BP 123/74   Pulse 61   Temp 98.2 F (36.8 C) (Oral)   Resp 16   Ht _0  (1.549 m)   Wt (!) 321 lb (145.6 kg)   LMP 04/06/2018   BMI 60.65 kg/m   Wt Readings from Last 3 Encounters:  04/17/18 (!) 321 lb (145.6 kg)  04/15/18 (!) 321 lb (145.6 kg)  03/27/18 (!) 316 lb 12 oz (143.7 kg)    Physical Exam  Constitutional: She is oriented to person, place, and time. She appears well-developed and  well-nourished. No distress.  Well-appearing, comfortable, cooperative, obese  HENT:  Head: Normocephalic and atraumatic.  Mouth/Throat: Oropharynx is clear and moist.  Eyes: Conjunctivae are normal. Right eye exhibits no discharge. Left eye exhibits no discharge.  Cardiovascular: Normal rate.  Pulmonary/Chest: Effort normal.  Musculoskeletal: She exhibits no edema.  Neurological: She is alert and oriented to person, place, and time.  Skin: Skin is warm and  dry. No rash noted. She is not diaphoretic. No erythema.  Psychiatric: She has a normal mood and affect. Her behavior is normal.  Well groomed, good eye contact, normal speech and thoughts  Nursing note and vitals reviewed.  Results for orders placed or performed during the hospital encounter of 04/15/18  Glucose, capillary  Result Value Ref Range   Glucose-Capillary 76 70 - 99 mg/dL      Assessment & Plan:   Problem List Items Addressed This Visit    Ankylosing spondylitis ([place]) Given her rheum / inflammatory history - will check some additional lab markers and screening for SLE as requested by patient.    Relevant Orders   ANA   C-reactive protein   Sed Rate (ESR)   Cyclic citrul peptide antibody, IgG   Anti-DNA antibody, double-stranded   Antiphospholopid Ab Panel   Elevated hemoglobin A1c - Primary   Morbid (severe) obesity due to excess calories (HCC) Followed by [facility] now bariatric clinic - She will proceed with Metformin for now - emphasized today that she should re-try this low dose Metformin XR 525m - may take every other day to adjust, take with food - Future consider wt loss medications as well - we discussed GLP1 may be option for her in future - She will f/u with them monthly for progress and monitoring - Future may return to Bariatric Surgery consult if indicated    PTSD (post-traumatic stress disorder) Follow-up with Psych as advised [facility]  prior to pursuing further wt management    Sacroiliac inflammation ([place]   Relevant Orders   ANA   C-reactive protein   Sed Rate (ESR)   Cyclic citrul peptide antibody, IgG   Anti-DNA antibody, double-stranded   Antiphospholopid Ab Panel    Other Visit Diagnoses    Needs flu shot       Relevant Orders   Flu Vaccine QUAD 36+ mos IM (Completed)      No orders of the defined types were placed in this encounter.   Follow up plan: Return in about 6 months (around 10/16/2018) for PreDM A1c, Weight.  ANobie Putnam DO [facility] 04/17/2018, 10:44 AM

## 2018-04-21 LAB — ANTIPHOSPHOLOPID AB PANEL
Anticardiolipin IgG: <14 [GPL'U] (ref <=14)
PHOSPHATIDYLSERINE AB  (IGG): <10 U/mL (ref <10)
PHOSPHATIDYLSERINE AB  (IGM): <25 U/mL (ref <25)

## 2018-04-21 LAB — SEDIMENTATION RATE: SED RATE: 19 mm/h (ref 0–20)

## 2018-04-21 LAB — ANTI-DNA ANTIBODY, DOUBLE-STRANDED: ds DNA Ab: <1 IU/mL

## 2018-04-21 LAB — ANA: Anti Nuclear Antibody(ANA): NEGATIVE

## 2018-04-21 LAB — C-REACTIVE PROTEIN: CRP: 16.8 mg/L — AB (ref <8.0)

## 2018-04-21 LAB — CYCLIC CITRUL PEPTIDE ANTIBODY, IGG

## 2018-04-22 DIAGNOSIS — F3181 Bipolar II disorder: Secondary | ICD-10-CM | POA: Diagnosis not present

## 2018-04-23 ENCOUNTER — Ambulatory Visit
Admit: 2018-04-23 | Discharge: 2018-04-24 | Payer: MEDICAID | Attending: Rehabilitative and Restorative Service Providers" | Primary: Rehabilitative and Restorative Service Providers"

## 2018-04-23 DIAGNOSIS — Z6841 Body Mass Index (BMI) 40.0 and over, adult: Secondary | ICD-10-CM

## 2018-04-23 DIAGNOSIS — M461 Sacroiliitis, not elsewhere classified: Principal | ICD-10-CM

## 2018-04-23 DIAGNOSIS — R531 Weakness: Secondary | ICD-10-CM

## 2018-04-29 ENCOUNTER — Ambulatory Visit: Admit: 2018-04-29 | Discharge: 2018-04-30 | Payer: MEDICAID

## 2018-04-29 DIAGNOSIS — Z7189 Other specified counseling: Secondary | ICD-10-CM

## 2018-04-29 DIAGNOSIS — Z6841 Body Mass Index (BMI) 40.0 and over, adult: Secondary | ICD-10-CM

## 2018-04-29 DIAGNOSIS — R21 Rash and other nonspecific skin eruption: Secondary | ICD-10-CM

## 2018-04-29 DIAGNOSIS — M461 Sacroiliitis, not elsewhere classified: Principal | ICD-10-CM

## 2018-04-29 MED ORDER — CELECOXIB 200 MG CAPSULE
ORAL_CAPSULE | Freq: Two times a day (BID) | ORAL | 3 refills | 0 days | Status: CP
Start: 2018-04-29 — End: 2018-05-27

## 2018-04-29 MED ORDER — ADALIMUMAB PEN CITRATE FREE 40 MG/0.4 ML
SUBCUTANEOUS | 1 refills | 0 days | Status: CP
Start: 2018-04-29 — End: 2018-05-05

## 2018-04-29 NOTE — Unmapped (Signed)
CC: low back pain    Last clinic visit: 12/25/2017 with Jacqueline Hall [facility]     Accompanied by: husband, who contributed key portions of the history, and 30-year old daughter    History of Present Illness:     HPI:  Jacqueline Hall is a 32 y.o. female with a history of morbid obesity who presents for follow up of low back pain and possible ankylosing spondylitis. She was initially evaluated by myself in March 2018. She resumed Humira in June 2018 at the recommendation of her previous Rheumatologist, but self-discontinued after two doses due to inefficacy and loss of insurance. At last visit, MRI showed no active sacroiliitis.    Interim History:  ? At last visit with Jacqueline [facility], she received an IM steroid injection with no relief. She also received bilateral SI injections in the interim, which she states helped for 2 days. However, the pain reappeared and worsened.   ? She continues to endorse joint pain, which worsens in the morning. Endorses morning siffness lasting at least an hour, which resolves after a hot shower and activity.   ? She continues on Celebrex 100 mg bid, which some relief.   ? Endorses wrist pain, for which she was given a brace by Dr. Derrell Hall. However, she does not wear it because it does not fit. Endorses hand swelling. Endorses purpling of her hands in the morning, which worsens with coldness. Endorses numbness in feet in the morning. Endorses muscle cramps in calves and neck. Endorses facial rash that is ongoing.  ? Denies eye pain, or bloody stools. She endorses IBS, which has been evaluated by GI without a colonoscopy.    Record review:  I have reviewed the patient's allergies, medications, pertinent past medical, surgical, social and family history and have updated in Epic where appropriate. I have also reviewed the pertinent past medical records including notes, labs, imaging tests in the medical record and in Care Everywhere.     Review of Systems:   All other systems reviewed are negative except as per HPI.     Objective      Physical Exam:  Vitals:    04/29/18 0924   BP: 140/93   BP Site: L Arm   BP Position: Sitting   BP Cuff Size: Medium   Pulse: 87   Temp: 36.2 ??C (97.2 ??F)   TempSrc: Oral   Weight: (!) 146 kg (321 lb 12.8 oz)   Height: 154.9 cm (5' 0.98)     Body mass index is 60.84 kg/m??.  GENERAL: The patient is well appearing, in no acute distress. Ambulates around exam room easily and climbs on exam table without difficulty. Morbid obesity.   SKIN:    No rashes, psoriasis, tophi, or subcutaneous nodules.  EYES: EOMI, PERRL. Sclera anicteric, conjunctiva non- injected.   ENT: mucus membranes moist without pharyngeal exudates or ulcers.   Neck: supple, no cervical lymphadenopathy, no thyromegaly  Respiratory: Breathing non-labored, CTA bilaterally, no wheezing, crackles, or rhonchi  CV: Heart rate regular, no murmurs  GI: Abdomen soft, nontender, nondistended, no hepatosplenomegaly  VASCULAR: warm and well perfused extremities, no c/c/e.  NEURO: CN 2-12 grossly intact.   PSYCH: No depression or anxiety. Cooperative. Alert and oriented. Briefly tearful at end of visit.   MUSCULOSKELETAL:   ?? Neck with painless, unlimited ROM.??  ?? Bilateral shoulders, elbows, wrists, hands, fingers:  No deformity, erythema, warmth, swelling, effusion, tenderness, or limited ROM.?? Grip strength good. Able to curl all fingers. Prayer sign  negative. Mild synovitis in L wrist, pain with extension. Positive Finkelstein's on left.   ?? Spine nontender to palpation. Except: Mild TTP upper thoracic midline region.   ?? Hips without limited ROM.??   ?? Bilateral knees, ankles, feet, toes: No deformity, erythema, warmth, swelling, effusion, tenderness, or limited ROM. MTP squeeze negative.     Test Results:    Labs    04/17/2018 Care Everywhere  Negative ANA  Negative CCP  Negative dsDNA  ESR 19  CRP 16.8    EXAM: MRI PELVIS W WO CONTRAST MSK  DATE: 12/31/2017 12:59 PM  IMPRESSION:??  Findings most consistent with chronic nonactive sacroiliitis, similar to the prior exam. No evidence of active sacroiliitis.  Low-lying intrauterine device which appears to be at least partially within the cervical canal. This is suboptimally evaluated and pelvic ultrasound may be performed for further characterization.      Assessment/Plan:     Jacqueline Hall is a 32 y.o. female with history of morbid obesity who is being seen for longstanding low back pain. She has sacroiliitis on imaging, however repeat MRI in July 2019 does not show evidence of active sacroiliitis similar to MRI in April 2018. However, she does have longstanding low back pain with morning stiffness improved with activity which may still point to spondyloarthritis. Discussed with patient that she may have other etiologies for her back pain, including mechanical etiologies, likely exacerbated by severe obesity.      Spondyloarthritis:  Discussed trial of Humira with patient today. Reviewed if she does not have marked improvement in 3-6 months, will consider spondyloarthritis to have been ruled out.     High risk medication monitoring:   - Patient is resuming Humira an immunosuppressant medication.  - Patient had a quant gold in March 2018 that was neg.  she does not have any risk factors for TB exposure - no travel to TB-endemic area, does not volunteer, work, or live at [employer] or correctional facility, does not care for TB patients, no contact with individual with active pulmonary TB. Does not require repeat quant gold unless she has new risk factors for TB exposure. TB risk factors were reviewed: 04/29/2018    - She had negative Hepatitis serologies in March 2018.     Morbid obesity with BMI of 50.0-59.9, adult (CMS-HCC)  Encouraged patient to continue efforts of weight loss.     Facial rash:  Unlikely autoimmune etiology, negative ANA at PCP, makes lupus very unlikely. May be rosacea. Referred to [facility] for further evaluation.    Immunization Counseling:  Flu vaccine: 04/17/2018  Prevnar PCV-13: 09/07/2016  Pneumovax PPSV-23: 12/11/2016    Low-lying IUD on MRI  Patient reports saw Gyn and IUD removed.     Follow Up: Return in about 6 months (around 10/28/2018) for Follow-up ? sacroiliitis. We discussed if Humira not helpful, anticipate she will no longer require a follow up with Rheumatology.     Danella Maiers, MD, MSCI  Assistant Professor of Medicine  Department of Medicine/Division of Rheumatology  [place] [facility]  [phone] clinic phone  [phone] clinic secure fax      We appreciate the opportunity to participate in the care of this patient. Total duration of the visit was 30 minutes, with more than 50% of the visit spent in face-to-face counseling focusing on diagnosis and therapeutic options and coordinating care.      Diagnoses and all orders for this visit:    Sacroiliitis (CMS-HCC)  Morbid obesity with BMI of 50.0-59.9, adult (CMS-HCC)    Immunization counseling    Rash of face  -     Ambulatory referral to Dermatology; Future    Other orders  -     lamoTRIgine 25 mg (35) DsPk; 25 mg daily.   -     ADALIMUMAB PEN CITRATE FREE 40 MG/0.4 ML; Inject 0.4 mL (40 mg total) under the skin every fourteen (14) days.  -     celecoxib (CELEBREX) 200 MG capsule; Take 1 capsule (200 mg total) by mouth Two (2) times a day.        This note was entered by [employer], acting as scribe for Danella Maiers, MD, MSCI.  Signature: CTV  Date:04/29/18   Time: 10:26 AM    I have reviewed the documentation provided by the scribe and confirm that it accurately reflects the service I personally performed and the decisions made by me.    Danella Maiers, MD, MSCI  April 29, 2018   10:26 AM

## 2018-04-29 NOTE — Unmapped (Addendum)
Your physician today was Dr. Danella Maiers    Thank you for letting us be involved with your care!    Today we discussed the following:    I am unsure if you have inflammation causing your arthritis and joint pan. It is possible your back pain is from spondyloarthritis even though the MRI has been normal twice. Your back pain may also be from wear and tear and weight. I am not sure your wrist is from inflammation or from tendonitis. We discussed resuming Humira, in addition to continuing Celebrex. If you don't have a good response to Humira after 3-6 months, I would say your arthritis is not due to inflammation and you do not have spondyloarthritis. We can also switch Celebrex.       1. Referral to [facility] at [facility]  [address]  [place], [place] [address]  [phone]  2. Resume Humira 40 mg every 2 weeks. Will have you discuss with Geoffry Paradise, CPP about self-injection process.   3. Refer to Dermatology for facial rash.  4. I recommend walking or other exercise regularly. Start with 10 minutes a day 5 days/week. Gradually increase by 2-3 minutes per session every few weeks.   5. If you have non-urgent questions, the best way to contact us is to send a MyChart message by visiting [email or web address].  You can also use MyChart to request refills and access test results.  I will do my best to respond within 2 business days, however occasionally it may take longer. If you have immediate concerns, please contact our clinic by phone [phone].   6. Next appointment: 6 months with Dr. Sullivan Lone.

## 2018-05-05 ENCOUNTER — Encounter: Payer: Self-pay | Admitting: Nurse Practitioner

## 2018-05-05 MED ORDER — ADALIMUMAB PEN CITRATE FREE 40 MG/0.4 ML
SUBCUTANEOUS | 1 refills | 0 days | Status: CP
Start: 2018-05-05 — End: 2018-10-02

## 2018-05-06 ENCOUNTER — Encounter: Payer: Self-pay | Admitting: Family Medicine

## 2018-05-06 ENCOUNTER — Ambulatory Visit (INDEPENDENT_AMBULATORY_CARE_PROVIDER_SITE_OTHER): Payer: BLUE CROSS/BLUE SHIELD | Admitting: Family Medicine

## 2018-05-06 VITALS — BP 106/73 | HR 65 | Temp 98.2°F | Resp 16 | Ht 61.0 in | Wt 325.0 lb

## 2018-05-06 DIAGNOSIS — N3001 Acute cystitis with hematuria: Secondary | ICD-10-CM | POA: Diagnosis not present

## 2018-05-06 DIAGNOSIS — N390 Urinary tract infection, site not specified: Secondary | ICD-10-CM | POA: Diagnosis not present

## 2018-05-06 DIAGNOSIS — R319 Hematuria, unspecified: Secondary | ICD-10-CM | POA: Diagnosis not present

## 2018-05-06 LAB — POCT URINALYSIS DIPSTICK
Bilirubin, UA: NEGATIVE
Glucose, UA: NEGATIVE
KETONES UA: NEGATIVE
NITRITE UA: NEGATIVE
PH UA: 5 (ref 5.0–8.0)
PROTEIN UA: NEGATIVE
Spec Grav, UA: 1.01 (ref 1.010–1.025)
UROBILINOGEN UA: 0.2 U/dL

## 2018-05-06 MED ORDER — CEPHALEXIN 500 MG PO CAPS: 500.0000 mg | ORAL_CAPSULE | Freq: Three times a day (TID) | ORAL | 0 refills | Status: DC

## 2018-05-06 NOTE — Progress Notes (Signed)
Subjective:    Patient ID: Leah Osborne, female    DOB: [birth year], 32 y.o.   MRN: [id number]  Leah Osborne is a 32 y.o. female presenting on 05/06/2018 for Urinary Tract Infection (onset 4 days back pain, hurts when urinate, burning onset yesterday)  Patient presents for a same day appointment.  HPI   UTI Reports now with some upper mid back Left sided pain onset about 4 days ago, and now within past 24 hours new symptom associated with dysuria burning with every void - Reduced urine  - Admits episode of brief gross hematuria today with urine sample, a drop of blood in toilet, usually this does not happen - No prior history of kidney stone - Admits reduced urine output only x 2 voids yesterday, had some improved urination today - Denies fevers, chills, nausea vomiting, abdominal pain  Additional update She states based on last visit with [facility] Rheumatology 04/29/18 for sacroilitis they will do trial on humira and if not improving may not be autoimmune problem, and she may be established with Orthopedics to continue.  Depression screen [facility] 2/9 05/06/2018 04/17/2018 02/19/2018  Decreased Interest 0 0 0  Down, Depressed, Hopeless 0 0 0  PHQ - 2 Score 0 0 0  Altered sleeping 0 0 0  Tired, decreased energy 0 0 0  Change in appetite 0 0 0  Feeling bad or failure about yourself  0 0 0  Trouble concentrating 0 0 0  Moving slowly or fidgety/restless 0 0 0  Suicidal thoughts 0 0 0  PHQ-9 Score 0 0 0  Difficult doing work/chores - - -    Social History   Tobacco Use  . Smoking status: Never Smoker  . Smokeless tobacco: Never Used  Substance Use Topics  . Alcohol use: Not Currently    Alcohol/week: 0.0 standard drinks  . Drug use: No    Review of Systems Per HPI unless specifically indicated above     Objective:    BP 106/73   Pulse 65   Temp 98.2 F (36.8 C) (Oral)   Resp 16   Ht 5\' 1"  (1.549 m)   Wt (!) 325 lb (147.4 kg)   LMP 04/06/2018   BMI 61.41  kg/m   Wt Readings from Last 3 Encounters:  05/06/18 (!) 325 lb (147.4 kg)  04/17/18 (!) 321 lb (145.6 kg)  04/15/18 (!) 321 lb (145.6 kg)    Physical Exam  Constitutional: She is oriented to person, place, and time. She appears well-developed and well-nourished. No distress.  Well-appearing, comfortable, cooperative, obese  HENT:  Head: Normocephalic and atraumatic.  Mouth/Throat: Oropharynx is clear and moist.  Eyes: Conjunctivae are normal. Right eye exhibits no discharge. Left eye exhibits no discharge.  Cardiovascular: Normal rate.  Pulmonary/Chest: Effort normal.  Abdominal: Soft. Bowel sounds are normal. She exhibits no distension. There is no tenderness.  Musculoskeletal: She exhibits no edema.  No CVAT, localized symptoms with L upper back / flank pain but not reproducible on exam.  Neurological: She is alert and oriented to person, place, and time.  Skin: Skin is warm and dry. No rash noted. She is not diaphoretic. No erythema.  Psychiatric: She has a normal mood and affect. Her behavior is normal.  Well groomed, good eye contact, normal speech and thoughts  Nursing note and vitals reviewed.  Results for orders placed or performed in visit on 05/06/18  POCT Urinalysis Dipstick  Result Value Ref Range   Color, UA amber  Clarity, UA clear    Glucose, UA Negative Negative   Bilirubin, UA Negative    Ketones, UA Negative    Spec Grav, UA 1.010 1.010 - 1.025   Blood, UA trace    pH, UA 5.0 5.0 - 8.0   Protein, UA Negative Negative   Urobilinogen, UA 0.2 0.2 or 1.0 E.U./dL   Nitrite, UA Negative    Leukocytes, UA Trace (A) Negative   Appearance clear    Odor none       Assessment & Plan:   Problem List Items Addressed This Visit    None    Visit Diagnoses    Acute cystitis with hematuria    -  Primary   Relevant Medications   cephALEXin (KEFLEX) 500 MG capsule   Other Relevant Orders   POCT Urinalysis Dipstick (Completed)   Urine Culture      Clinically  consistent with UTI, UA is suggestive. Cannot rule out nephrolithiasis given brief gross hematuria and symptoms. No prior nephrolithiasis No concern for pyelo today (no systemic symptoms, neg fever, back pain, n/v).  Plan: 1. Check UA today - trace blood, leuks 2. Ordered Urine culture 3. Keflex 500mg  TID x 7 days 4. Improve PO hydration 5. RTC if no improvement 1-2 weeks, red flags given to return sooner - Reviewed precautions with concerns for possible kidney stone, follow-up criteria   Meds ordered this encounter  Medications  . cephALEXin (KEFLEX) 500 MG capsule    Sig: Take 1 capsule (500 mg total) by mouth 3 (three) times daily. For 7 days    Dispense:  21 capsule    Refill:  0    Follow up plan: Return in about 1 week (around 05/13/2018), or if symptoms worsen or fail to improve, for UTI.  Nobie Putnam, [facility] 05/06/2018, 3:17 PM

## 2018-05-06 NOTE — Patient Instructions (Addendum)
Thank you for coming to the office today.  1. You have a Urinary Tract Infection - this is very common, your symptoms are reassuring and you should get better within 1 week on the antibiotics - Start Keflex 500mg  3 times daily for next 7 days, complete entire course, even if feeling better - We sent urine for a culture, we will call you within next few days if we need to change antibiotics - Please drink plenty of fluids, improve hydration over next 1 week  If symptoms worsening, developing nausea / vomiting, worsening back pain, fevers / chills / sweats, then please return for re-evaluation sooner.  To prevent bladder and kidney infections...  Wipe front to back after using the restroom  Drink enough water to keep your pee clear to pale yellow  If you think you are getting another bladder infection, start drinking cranberry juice and come see [place] so we can check the urine.  You may also have a kidney stone. As discussed  Very important to see Urologist as scheduled and discuss removal options and they may repeat imaging  If you get any of the following it is more of an emergency and may need to go to hospital directly or we can try to contact Urology sooner  - Fever, chills sweats nausea vomiting cannot tolerate antibiotic - Cannot pee or void at all for 12 hours straight, despite straining and medicine   Please schedule a Follow-up Appointment to: Return in about 1 week (around 05/13/2018), or if symptoms worsen or fail to improve, for UTI.  If you have any other questions or concerns, please feel free to call the office or send a message through [place]. You may also schedule an earlier appointment if necessary.  Additionally, you may be receiving a survey about your experience at our office within a few days to 1 week by e-mail or mail. We value your feedback.  Nobie Putnam, DO [place]

## 2018-05-07 ENCOUNTER — Ambulatory Visit
Admit: 2018-05-07 | Discharge: 2018-05-08 | Payer: MEDICAID | Attending: Rehabilitative and Restorative Service Providers" | Primary: Rehabilitative and Restorative Service Providers"

## 2018-05-07 DIAGNOSIS — M461 Sacroiliitis, not elsewhere classified: Principal | ICD-10-CM

## 2018-05-07 DIAGNOSIS — R531 Weakness: Secondary | ICD-10-CM

## 2018-05-07 LAB — URINE CULTURE
MICRO NUMBER: 91392673
SPECIMEN QUALITY: ADEQUATE

## 2018-05-07 NOTE — Unmapped (Signed)
[facility]  OUTPATIENT PHYSICAL THERAPY  Treatment Note  05/07/2018    Patient Name: Jacqueline Hall  Date of Birth:02/07/1986  Session: 2  Diagnosis:   Encounter Diagnoses   Name Primary?   ??? Sacroiliitis, not elsewhere classified (CMS-HCC) Yes   ??? Weakness        Onset of Symptoms: 09/08/15  Date of Evaluation: 04/23/18     Dates of Certification: 04/23/18 - 07/16/18       Problem List: Decreased strength, Decreased range of motion, Pain, Impaired tone, Core weakness, Urinary incontinence    ASSESSMENT:    Assessment: Patient responded well to STM during today's session to TrPs within left posterior hip girdle; likely affecting L hip stability and contributing to (+) L Trendelenburg. Good ability to initiate bridge exercise during session with notable decrease in L glut max/med activation; improved with slight modification to L foot placement. Would cont to benefit from further stabilization exercises and functional exercise as able.      Goals:      Short Term Goals (6 weeks)  ?? Hip ER strength 5/5 for improved femoroacetabular stability and decreased strain on obturator internus during weight bearing activities.  ?? Trendelenburg with single limb stance eliminated for improved LE alignment with ambulation and ascending/descending stairs for continued pelvic floor muscle stability.  ??  Long Term Goals (12 weeks)  ?? Patient able to sit on hard surface for 60 minutes with </= 6/10 pain for improved ability to return to PLOF and work without pain.  ?? Patient able to walk for 45 minutes with </= 6/10 pain for improved ability to resume PLOF and maintain activity for improved weight loss.  ?? Pt independent with HEP for self-management of sxs.  ??    PLAN:  1x week   for Duration: 12 weeks decreasing frequency as able         SUBJECTIVE:  Subjective : Didn't have pain for the first week after the initial evaluation, then this past week started having L lateral hip pain with sit to stand and walking. Also pain with WBing on L side.   Precautions: None       Pain: Yes  Location: L lateral hip     Current Pain Level: 3 Max Pain Level: 7(with walking)        OBJECTIVE:    Manual Therapy x 20' total:   Pelvic Alignment:  IC: level  PSIS: level  Sacral Sulcus: equal mobility bilaterally w/ no pain  (-) B Gillet's Test  (+) L Trendelenburg     (+) Jump sign to L glut med, TFL, glut max    STM to L glut med, glut min, glut max, TFL & piriformis with ischemic pressure; improved 75-80%     Therapeutic Exercise x 10' total:  ?? Bridge w/ gait belt around distal thigh 2 x 10 reps; Patient demonstrates appropriately and expresses verbal understanding therefore added to HEP, h/o given    ??  Special Tests/Clearing Screen:  ?? Hip & SIJ Screen:   ? R:  (-) Scour, P4, FABER, FADIR  ? L:  (+) Scour & P4 w/ pain to L low back  ?? MSI Screen: (+) L ant fem glide  ??  External Muscle/Ligament Palpation:    TTP B QL, B piriformis, L Glut Max  ??  Strength:  L hip flexion 2/5 MMT  R hip flexion 3+/5 MMT  ??  B hip ER = 5/5 MMT  B  hip IR = 5/5 MMT  ??    H/o: Bridge w/ gait belt around distal thigh 2 x 10 reps    HEP:  OI retraining w/ leg bent    Education Provided: Role of therapy in Rehabiliation, importance of therapy, HEP, body mechanics, body awareness, treatment options and plan, symptom management Education results: Verbalized understanding, demonstrated understanding    Communication/Consultation: n/a      Treatment Rendered:    Total Treatment Time = 30 min (  x 20 min, 1TE x 10 min) (15 min late to appointment)    I attest that I have reviewed the above information.  Signed: Lajean Silvius PT, DPT  05/07/18 12:12 PM

## 2018-05-07 NOTE — Unmapped (Signed)
Bridge w/Belt on Legs    1. Lie down with knees bent and place a belt around your thighs  2. Press tension out into the belt and maintain this tension throughout the exercise.  3. Push heels into the floor and lift hips (can bring left leg closer to your bottom to improve activation)  4. Relax and lower down  5. Repeat 10 reps, 2 x per day

## 2018-05-08 ENCOUNTER — Other Ambulatory Visit: Payer: Self-pay

## 2018-05-08 ENCOUNTER — Telehealth: Payer: Self-pay

## 2018-05-08 DIAGNOSIS — B029 Zoster without complications: Secondary | ICD-10-CM | POA: Diagnosis not present

## 2018-05-08 DIAGNOSIS — E039 Hypothyroidism, unspecified: Secondary | ICD-10-CM

## 2018-05-08 MED ORDER — LEVOTHYROXINE SODIUM 25 MCG PO TABS: 25.0000 ug | ORAL_TABLET | Freq: Every day | ORAL | 3 refills | Status: DC

## 2018-05-08 NOTE — Telephone Encounter (Signed)
I agree with this triage. It is possible for shingles. This is a new finding, as this was not a complaint when she was seen in office.  Her urine culture was negative. She may STOP keflex at this time. If she thinks it is not improving off antibiotic, then she should be seen. Either soonest available at our office or at Urgent Care or Hospital, if she prefers.  Nobie Putnam, DO [facility] 05/08/2018, 11:08 AM

## 2018-05-08 NOTE — Telephone Encounter (Signed)
Pt called complaining of a red rash on the left side of her back that is painful, burning, numbness and tingling. The rash appeared yesterday on the the pt back. The pt is currently on Keflex for UTI that she was diagnose with x 2 days ago. Unsure if its an reaction to the Keflex but, reports she's taking Keflex in the pass with no issues. The pt is concern it could be shingles. I notified the pt that we currently do not have any appointment available. I recommended that she go to the Urgent Care or [facility] for evaluation to rule out and treat appropriate. The pt verbalize understanding and stated she could not afford the Urgent Care Co-pay, but she will go to the hospital.

## 2018-05-09 NOTE — Telephone Encounter (Signed)
.  mychart

## 2018-05-12 ENCOUNTER — Other Ambulatory Visit: Payer: Self-pay | Admitting: *Deleted

## 2018-05-12 ENCOUNTER — Ambulatory Visit: Admit: 2018-05-12 | Discharge: 2018-05-13 | Payer: MEDICAID | Attending: Obesity Medicine | Primary: Obesity Medicine

## 2018-05-12 DIAGNOSIS — G43009 Migraine without aura, not intractable, without status migrainosus: Principal | ICD-10-CM

## 2018-05-12 DIAGNOSIS — Z6841 Body Mass Index (BMI) 40.0 and over, adult: Secondary | ICD-10-CM | POA: Diagnosis not present

## 2018-05-12 DIAGNOSIS — R002 Palpitations: Secondary | ICD-10-CM

## 2018-05-12 MED ORDER — PROPRANOLOL HCL 20 MG PO TABS: ORAL_TABLET | ORAL | 6 refills | Status: DC

## 2018-05-12 NOTE — Unmapped (Signed)
Obesity Medicine Summary at initial presentation on    Jacqueline Hall is a 32 y.o. female and Body mass index is 60.72 kg/m??..  She presents with worsening of her obesity and concerns about her health.  Her target weight is 230 pounds (weight loss of 90 pounds).  Her weight gain history suggestive of strong family history and subsequent weight gain due to poor dietary and exercise choices.Marland Kitchen  Co-morbidities at this time include migraine disorder, sacroiliitis, iron deficiency anemia, vitamin D deficiency.  ??  Treatment course  Starting weight at presentation:321.2 lb  Weight today:320.4 lb   Weight loss since last visit about 4 weeks ago:1 lb  Total weight loss since presentation:1 lb   Percent weight loss since presentation:NA  Percent weight loss with matformin : NA    Previous use of anti-obesity medications:  Phentermine: yes  15 lb loss over 3-6 months  Metformin: no  Topiramate: yes- no weight loss.  Bupropion: no  Liraglutide: no  SGLT-2 inhibitor: no  Orlistat: no   Other agents:    Narrative history  ??No c/o. Will be getting a new insurance in the new year which covers bariatric surgery.  ?  Diet: Has been making better dietary choices. Last meal of the day is a protein shake at 7 pm.  ?  ?Exercise: walks 3000 steps at lunch and walking 5000 steps a day.  ??  Sleep:Sleep has been affected since started prednisone.  ??  Stress: does not feel it is an issue.  ???  Medications:Has seen a psychiatrist and was started on lamotrigine. Was started on prednisone and Valtrex for shingles      Eating disorders:    Patient Active Problem List    Diagnosis Date Noted   ??? Sacroiliitis (CMS-HCC) 12/11/2016   ??? Immunization counseling 12/11/2016   ??? High risk medication use 09/07/2016   ??? Menorrhagia    ??? Vitamin D deficiency 05/02/2016   ??? Elevated blood-pressure reading without diagnosis of hypertension    ??? Encounter for tubal ligation 06/09/2015   ??? Morbid obesity with BMI of 50.0-59.9, adult (CMS-HCC) 04/25/2015 ??? Dizziness 04/08/2015   ??? Mood disorder (CMS-HCC) 02/02/2015   ??? Iron deficiency anemia 01/04/2015   ??? Migraine without status migrainosus, not intractable 01/04/2015   ??? Pap smear with low grade squamous intraepithelial lesion (LGSIL) 07/04/2011       Current Outpatient Medications on File Prior to Visit   Medication Sig Dispense Refill   ??? ADALIMUMAB PEN CITRATE FREE 40 MG/0.4 ML Inject 0.4 mL (40 mg total) under the skin every fourteen (14) days. 6 each 1   ??? celecoxib (CELEBREX) 200 MG capsule Take 1 capsule (200 mg total) by mouth Two (2) times a day. (Patient not taking: Reported on 05/12/2018) 180 capsule 3   ??? lamoTRIgine 25 mg (35) DsPk 25 mg daily.      ??? levothyroxine (SYNTHROID, LEVOTHROID) 25 MCG tablet Take 1.5 tablets (37.5 mcg total) by mouth daily. 90 tablet 3   ??? MEDICAL SUPPLY ITEM Please provide patient with 2 left carpal tunnel syndrome braces for wrists. 2 Units 0   ??? metFORMIN (GLUCOPHAGE-XR) 500 MG 24 hr tablet Take 4 tablets (2,000 mg total) by mouth every morning. Start with 1 tab daily, increase by 1 tab every week. Max dose 4 tabs daily. 120 tablet 3   ??? metoprolol tartrate (LOPRESSOR) 25 MG tablet Take 12.5 mg by mouth.     ??? omeprazole (PRILOSEC) 40 MG capsule Take 40 mg  by mouth.     ??? predniSONE (DELTASONE) 10 MG tablet Take 20 mg by mouth. Taper     ??? valACYclovir (VALTREX) 1000 MG tablet Take 1 g by mouth Three (3) times a day.       No current facility-administered medications on file prior to visit.        Blood pressure 146/95, pulse 64, height 154.9 cm (5' 0.98), weight (!) 145.3 kg (320 lb 6.4 oz), unknown if currently breastfeeding.  Body mass index is 60.57 kg/m??.      Exam:  AAO x3  Respiratory:clear to auscultation  Gastrointestinal: NAD  Ext: No pedal edema      Plan on 04/11/2018  Based on my assessment of the possible etiologies for the patient's obesity, medical co-morbidities, my recommendations include the following :  ????????Diet quality and patterning: Based on 24-hour dietary recall patient tends to eat processed foods on some occasions.  Today she was given information about following a Mediterranean diet and will continue to follow-up with me.  If she appears to be struggling with better food choices, I will refer her to the medical weight program dietitian for follow-up.  ??  ????????Exercise:???? At this time her physical activity is very low.  She is however willing to start walking and we have talked about aiming for 10 to 15 minutes 3 times a week.??  ??      Sleep: Sleep is affected by her waking up multiple times at night to use the bathroom.  We will address sleep hygiene in future.  She also says that she has not been using the CPAP due to lack of filters.  Since she now has insurance coverage, she is willing to look into getting filters for the machine.  ??  ???? Stress management: Not an issue at this time.  Will reassess in future.  ????   ???? Weight gain causing medications: Patient has been on agents which her weight gain promoting in the past.  This includes her psychiatric medication.  She however has stopped using all her psychiatry medications but will need to reestablish with a new psychiatrist before starting a new regimen.  ??  ???? Anti Obesity Medications: A few anti-obesity medications may be appropriate for use for the patient.  Since it is possible that some of the weight gain occurred due to psychotropic medication, metformin or topiramate may be good choices.  I would not want to use phentermine due to her anxiety and palpitations.  Bupropion also would need to be held off unless she has regular psychiatry follow-up.  ??  ??  Obesity Surgery: Patient clearly meets criteria for benefit from bariatric surgery and is interested in going forward with surgery.  Her insurance will be changing in the new year and it is unclear if she will need 1 year of follow-up for medical weight loss or 6 months.    Plan on 05/12/2018  Patient appeared to be doing well with the metformin and the lamotrigine combination.  She did however have a short-term setback needing prednisone which may have caused fluid retention and increased weight.  She will continue on the metformin and lamotrigine and will return to see Marcelle Smiling in 4 weeks.    Counselling  A total of 25 minutes (99214) minutes spent face-to-face with the patient, of which greater than 50% was spent counseling/coordinating care regarding obesity and management of related comorbidities.

## 2018-05-20 DIAGNOSIS — F3181 Bipolar II disorder: Secondary | ICD-10-CM | POA: Diagnosis not present

## 2018-05-21 NOTE — Unmapped (Signed)
[facility] MEDICAL OFFICE BUILDING  [address]                                   [place], [place] [address]    [phone]    Jacqueline Hall called to cancel her  scheduled Physical Therapy follow-up session less than 2 hours prior to appointment.  Please contact me if you have any questions or concerns.    Thank you for this referral,     Signed: Telford Nab, PT, DPT  05/21/2018 10:23 AM

## 2018-05-21 NOTE — Unmapped (Deleted)
[facility]AP[facility] SERVICES [facility]  OUTPATIENT PHYSICAL THERAPY     05/21/2018    Patient Name: Jacqueline Hall  Date of Birth:March 12, 1986  Session:    Diagnosis:   No diagnosis found.                          ASSESSMENT:          Goals:      Short Term Goals (6 weeks)  ?? Hip ER strength 5/5 for improved femoroacetabular stability and decreased strain on obturator internus during weight bearing activities.  ?? Trendelenburg with single limb stance eliminated for improved LE alignment with ambulation and ascending/descending stairs for continued pelvic floor muscle stability.  ??  Long Term Goals (12 weeks)  ?? Patient able to sit on hard surface for 60 minutes with </= 6/10 pain for improved ability to return to PLOF and work without pain.  ?? Patient able to walk for 45 minutes with </= 6/10 pain for improved ability to resume PLOF and maintain activity for improved weight loss.  ?? Pt independent with HEP for self-management of sxs.  ??    PLAN:      for           SUBJECTIVE:                                 OBJECTIVE:  ***  Patient responded well to [facility] during today's session to TrPs within left posterior hip girdle; likely affecting L hip stability and contributing to (+) L Trendelenburg. Good ability to initiate bridge exercise during session with notable decrease in L glut max/med activation; improved with slight modification to L foot placement. Would cont to benefit from further stabilization exercises and functional exercise as able.   _______________________________________________________________________  Manual Therapy x 20' total:   Pelvic Alignment:  IC: level  PSIS: level  Sacral Sulcus: equal mobility bilaterally w/ no pain  (-) B Gillet's Test  (+) L Trendelenburg     (+) Jump sign to L glut med, TFL, glut max    STM to L glut med, glut min, glut max, TFL & piriformis with ischemic pressure; improved 75-80%     Therapeutic Exercise x 10' total:  ?? Bridge w/ gait belt around distal thigh 2 x 10 reps; Patient demonstrates appropriately and expresses verbal understanding therefore added to HEP, h/o given    ??  Special Tests/Clearing Screen:  ?? Hip & SIJ Screen:   ? R:  (-) Scour, P4, FABER, FADIR  ? L:  (+) Scour & P4 w/ pain to L low back  ?? MSI Screen: (+) L ant fem glide  ??  External Muscle/Ligament Palpation:    TTP B QL, B piriformis, L Glut Max  ??  Strength:  L hip flexion 2/5 MMT  R hip flexion 3+/5 MMT  ??  B hip ER = 5/5 MMT  B hip IR = 5/5 MMT  ??    H/o: ***    HEP:  OI retraining w/ leg bent  Bridge w/ gait belt around distal thigh 2 x 10 reps                  Treatment Rendered:    Total Treatment Time = *** min (***)     I attest that I have reviewed the above information.  Signed: Lajean Silvius PT, DPT  05/20/18 7:33 PM

## 2018-05-27 ENCOUNTER — Encounter: Payer: Self-pay | Admitting: Obstetrics and Gynecology

## 2018-05-27 ENCOUNTER — Ambulatory Visit (INDEPENDENT_AMBULATORY_CARE_PROVIDER_SITE_OTHER): Payer: BLUE CROSS/BLUE SHIELD | Admitting: Obstetrics and Gynecology

## 2018-05-27 VITALS — BP 138/78 | HR 91 | Ht 61.0 in | Wt 320.5 lb

## 2018-05-27 DIAGNOSIS — N939 Abnormal uterine and vaginal bleeding, unspecified: Secondary | ICD-10-CM

## 2018-05-27 DIAGNOSIS — Z9851 Tubal ligation status: Secondary | ICD-10-CM

## 2018-05-27 DIAGNOSIS — G8929 Other chronic pain: Secondary | ICD-10-CM

## 2018-05-27 DIAGNOSIS — N9419 Other specified dyspareunia: Secondary | ICD-10-CM

## 2018-05-27 DIAGNOSIS — R102 Pelvic and perineal pain: Secondary | ICD-10-CM

## 2018-05-27 DIAGNOSIS — N938 Other specified abnormal uterine and vaginal bleeding: Secondary | ICD-10-CM | POA: Diagnosis not present

## 2018-05-27 DIAGNOSIS — N946 Dysmenorrhea, unspecified: Secondary | ICD-10-CM

## 2018-05-27 MED ORDER — ELAGOLIX SODIUM 150 MG PO TABS: 150.0000 mg | ORAL_TABLET | Freq: Every day | ORAL | 1 refills | Status: DC

## 2018-05-27 MED ORDER — METFORMIN ER 500 MG TABLET,EXTENDED RELEASE 24 HR
ORAL_TABLET | Freq: Every morning | ORAL | 3 refills | 0 days | Status: CP
Start: 2018-05-27 — End: 2018-06-12

## 2018-05-27 MED ORDER — CELECOXIB 200 MG CAPSULE
ORAL_CAPSULE | Freq: Two times a day (BID) | ORAL | 3 refills | 0 days | Status: CP
Start: 2018-05-27 — End: 2018-11-20

## 2018-05-27 NOTE — Progress Notes (Signed)
GYN ENCOUNTER NOTE  Subjective:       Leah Osborne is a 32 y.o. G30P2002 female is here for gynecologic evaluation of the following issues:  1.  Abnormal uterine bleeding. 2.  Severe dysmenorrhea 3.  Chronic pelvic pain  Para 2-0-0-2.  Childbearing is complete. History of tubal ligation on 09/19/2015; findings in the time of surgery were notable for a normal pelvis; Falope-Rings were applied. Recent history of Mirena IUD use for regulation of abnormal uterine bleeding; suboptimal control; IUD had removed due to malpositioning in the lower uterine segment identified during radiology imaging; over the past 4 months menstrual calendar monitoring has demonstrated bleeding more often than not on an almost daily basis. Patient is experiencing severe pelvic pain in the form of dysmenorrhea, low backache worse with bleeding episodes; pain is so severe that she has missed work because of her symptoms; patient does have some painful bowel movements with menses as well as irritative voiding symptoms with menses. There is a family history of endometriosis in a cousin. Patient does not desire Depo-Provera.  Gynecologic History Patient's last menstrual period  Menarche-age 61. Cycles regular on a monthly basis. Duration of flow 5-7 days, moderate bleeding. History of severe dysmenorrhea; pain management with ibuprofen 600 mg per day; patient has missed work and school in the past because of severe cramps. Dysmenorrhea is characterized as central cramping along with low back pain without radiation. No history of deep thrusting dyspareunia. Family history of sister with severe dysmenorrhea without diagnosis of endometriosis. History of abnormal Pap smear No STI history  Obstetric History OB History  Gravida Para Term Preterm AB Living  2 2 2  0 0 2  SAB TAB Ectopic Multiple Live Births  0 0 0 0 2    # Outcome Date GA Lbr Len/2nd Weight Sex Delivery Anes PTL Lv  2 Term 2016   8 lb 1.6 oz (3.674  kg) F Vag-Spont   LIV  1 Term 2013   5 lb 2.2 oz (2.331 kg) M Vag-Spont   LIV    Past Medical History:  Diagnosis Date  . Abnormal Pap smear of cervix   . Arthritis   . Bipolar 1 disorder ([place])   . Chest pain   . Headache    MIGRAINES  . Hyperlipidemia   . Hypertension   . Palpitations    a. 09/2017 Zio Monitor: predominantly RSR, avg HR 87, 11 A tach/SVT episodes, fastest 176 x 7 beats, longest 9 beats. Rare PAC's, occas PVC's, rare couplets/triplets.  Marland Kitchen PTSD (post-traumatic stress disorder)   . PVC (premature ventricular contraction)   . Social phobia     Past Surgical History:  Procedure Laterality Date  . CHOLECYSTECTOMY    . LAPAROSCOPIC TUBAL LIGATION Bilateral 09/19/2015   Procedure: LAPAROSCOPIC BILATERAL TUBAL BANDING;  Surgeon: Brayton Mars, MD;  Location: [facility] ORS;  Service: Gynecology;  Laterality: Bilateral;    Current Outpatient Medications on File Prior to Visit  Medication Sig Dispense Refill  . celecoxib (CELEBREX) 100 MG capsule Take 100 mg by mouth 2 (two) times daily.    Marland Kitchen levothyroxine (SYNTHROID, LEVOTHROID) 25 MCG tablet Take 1 tablet (25 mcg total) by mouth daily before breakfast. 90 tablet 3  . metFORMIN (GLUCOPHAGE-XR) 500 MG 24 hr tablet     . omeprazole (PRILOSEC) 40 MG capsule Take 40 mg by mouth daily.    . propranolol (INDERAL) 20 MG tablet Take 1 tab (20 mg) by mouth twice daily. May take an extra 1  tab (20 mg) by mouth once daily as needed for breakthrough palpitations 70 tablet 6  . lamoTRIgine (LAMICTAL) 100 MG tablet      No current facility-administered medications on file prior to visit.     No Known Allergies  Social History   Socioeconomic History  . Marital status: Married    Spouse name: Not on file  . Number of children: Not on file  . Years of education: Not on file  . Highest education level: Not on file  Occupational History  . Not on file  Social Needs  . Financial resource strain: Not on file  . Food  insecurity:    Worry: Not on file    Inability: Not on file  . Transportation needs:    Medical: Not on file    Non-medical: Not on file  Tobacco Use  . Smoking status: Never Smoker  . Smokeless tobacco: Never Used  Substance and Sexual Activity  . Alcohol use: Not Currently    Alcohol/week: 0.0 standard drinks  . Drug use: No  . Sexual activity: Not on file  Lifestyle  . Physical activity:    Days per week: Not on file    Minutes per session: Not on file  . Stress: Not on file  Relationships  . Social connections:    Talks on phone: Not on file    Gets together: Not on file    Attends religious service: Not on file    Active member of club or organization: Not on file    Attends meetings of clubs or organizations: Not on file    Relationship status: Not on file  . Intimate partner violence:    Fear of current or ex partner: Not on file    Emotionally abused: Not on file    Physically abused: Not on file    Forced sexual activity: Not on file  Other Topics Concern  . Not on file  Social History Narrative  . Not on file    Family History  Problem Relation Age of Onset  . Diabetes Maternal Grandmother   . Heart disease Maternal Grandmother   . Heart failure Maternal Grandmother   . Heart attack Maternal Grandmother   . Rheum arthritis Mother   . Healthy Father   . Heart Problems Maternal Grandfather   . Atrial fibrillation Paternal Grandmother   . Cancer Neg Hx     The following portions of the patient's history were reviewed and updated as appropriate: allergies, current medications, past family history, past medical history, past social history, past surgical history and problem list.  Review of Systems Review of Systems  Constitutional: Negative.  Negative for malaise/fatigue.  Respiratory: Negative.   Cardiovascular: Negative.   Gastrointestinal: Positive for abdominal pain.  Genitourinary:       Chronic abnormal uterine bleeding Severe dysmenorrhea    Musculoskeletal: Positive for back pain.  Psychiatric/Behavioral: The patient is nervous/anxious.   All other systems reviewed and are negative.    Objective:   BP 138/78   Pulse 91   Ht 5\' 1"  (1.549 m)   Wt (!) 320 lb 8 oz (145.4 kg)   LMP 05/24/2018   BMI 60.56 kg/m  CONSTITUTIONAL: Well-developed, well-nourished female in no acute distress.  HENT:  Normocephalic, atraumatic.  NECK: Normal range of motion, supple, no masses.  Normal thyroid.  SKIN: Skin is warm and dry. No rash noted. Not diaphoretic. No erythema. No pallor. [place]: Alert and oriented to person, place, and time.  PSYCHIATRIC: Normal mood and affect. Normal behavior. Normal judgment and thought content. CARDIOVASCULAR:Not Examined RESPIRATORY: Not Examined BREASTS: Not Examined ABDOMEN: Soft, non distended; Non tender.  No Organomegaly. PELVIC:  External Genitalia: Normal  BUS: Normal  Vagina: No discharge  Cervix: Normal; 2/4 cervical motion tenderness  Uterus: Normal size, shape,consistency, mobile, 2/4 tender  Adnexa: Nonpalpable; tenderness left greater than right; 2/4 on left  RV: Normal external exam  Bladder: Nontender MUSCULOSKELETAL: Normal range of motion. No tenderness.  No cyanosis, clubbing, or edema.     Assessment:   1. Dysmenorrhea, severe, compromising activities of daily living.  Suspect endometriosis  2. Status post tubal ligation, normal-appearing pelvis at the time of laparoscopy  3. Abnormal uterine bleeding, almost daily, associated with pelvic pain  4. Morbid (severe) obesity due to excess calories ([place])  5. Chronic pelvic pain in female, central cramping as well as low backache as well as lateralization to the left and right     Plan:   1.  Trial of Orilissa 150 mg daily 2.  Follow-up in 4 weeks with Dr. Marcelline Mates 3.  Continue with ibuprofen and Tylenol for pain 4.  Patient not interested in Mirena IUD, Depo-Provera. 5.  If Delphia Grates is effective, patient may  consider hysterectomy  A total of 15 minutes were spent face-to-face with the patient during this encounter and over half of that time dealt with counseling and coordination of care.  Brayton Mars, MD  Note: This dictation was prepared with Dragon dictation along with smaller phrase technology. Any transcriptional errors that result from this process are unintentional.

## 2018-05-27 NOTE — Unmapped (Signed)
Celebrex refill  Last ov: 04/29/2018  Next ov: Visit date not found    Pt requesting RX for [facility] day supply [facility] (was [facility])

## 2018-06-12 ENCOUNTER — Ambulatory Visit
Admit: 2018-06-12 | Discharge: 2018-06-12 | Payer: MEDICAID | Attending: Rehabilitative and Restorative Service Providers" | Primary: Rehabilitative and Restorative Service Providers"

## 2018-06-12 ENCOUNTER — Ambulatory Visit
Admit: 2018-06-12 | Discharge: 2018-06-12 | Payer: MEDICAID | Attending: Physician Assistant | Primary: Physician Assistant

## 2018-06-12 DIAGNOSIS — Z6841 Body Mass Index (BMI) 40.0 and over, adult: Secondary | ICD-10-CM

## 2018-06-12 DIAGNOSIS — M461 Sacroiliitis, not elsewhere classified: Principal | ICD-10-CM

## 2018-06-12 DIAGNOSIS — R531 Weakness: Secondary | ICD-10-CM

## 2018-06-12 MED ORDER — TOPIRAMATE 25 MG TABLET
ORAL_TABLET | Freq: Every evening | ORAL | 2 refills | 0 days | Status: CP
Start: 2018-06-12 — End: 2018-07-03

## 2018-06-12 NOTE — Unmapped (Signed)
Obesity Medicine Summary at initial presentation on    Jacqueline Hall is a 32 y.o. female and Body mass index is 60.72 kg/m??..  She presents with worsening of her obesity and concerns about her health.  Her target weight is 230 pounds (weight loss of 90 pounds).  Her weight gain history suggestive of strong family history and subsequent weight gain due to poor dietary and exercise choices.Marland Kitchen  Co-morbidities at this time include migraine disorder, sacroiliitis, iron deficiency anemia, vitamin D deficiency.  ??  Treatment course  Starting weight at presentation:321.2 lb  Weight today:324.4 lb   Weight loss since last visit about 4 weeks ago: 4 lb weight gain  Total weight loss since presentation:1 lb   Percent weight loss since presentation:NA  Percent weight loss with matformin : NA    Previous use of anti-obesity medications:  Phentermine: yes  15 lb loss over 3-6 months  Metformin: yes, no weight loss  Topiramate: yes- no weight loss.  Bupropion: no  Liraglutide: no  SGLT-2 inhibitor: no  Orlistat: no   Other agents:    Narrative history  ?Will be getting a new insurance in the new year which covers bariatric surgery.  She c/o Chest tightness cannot take a deep breath three episodes lasting 2 hours. Has been seen in ED for similar pain in past and cardiac work up negative. She has follow up Jan 23 with her cardiology.   ?  Diet: Has been drinking protein shakes for breakfast and dinner without fruit. She gets hungry around 11am and has a  [employer] lunch around 2pm. Has been making better dietary choices. She thinks portions are too large but stops when satisfied. Last meal of the day is a protein shake at 7 pm.  ?  ?Exercise: walks 3000 steps at lunch and walking 5000 steps a day.  ??  Sleep:Sleep has improved. Wakes up once to urinate. Has stopped drinking fluid before bed. Feels rested in morning. .  ??  Stress: does not feel it is an issue.  ???  Medications: Has been taking Metformin 2000mg  daily. Reports diarrhea about six episodes per day which is increased from her baseline associated with IBS. Also c/o nausea. States symptoms occurred at lower dose of Metformin as well.  Admits to mild appetite suppression with metformin. Has seen a psychiatrist and was started on lamotrigine.     Eating disorders: Denies binge eating, late night eating or emotional eating.     Patient Active Problem List    Diagnosis Date Noted   ??? Sacroiliitis (CMS-HCC) 12/11/2016   ??? Immunization counseling 12/11/2016   ??? High risk medication use 09/07/2016   ??? Menorrhagia    ??? Vitamin D deficiency 05/02/2016   ??? Elevated blood-pressure reading without diagnosis of hypertension    ??? Encounter for tubal ligation 06/09/2015   ??? Morbid obesity with BMI of 50.0-59.9, adult (CMS-HCC) 04/25/2015   ??? Dizziness 04/08/2015   ??? Mood disorder (CMS-HCC) 02/02/2015   ??? Iron deficiency anemia 01/04/2015   ??? Migraine without status migrainosus, not intractable 01/04/2015   ??? Pap smear with low grade squamous intraepithelial lesion (LGSIL) 07/04/2011       Current Outpatient Medications on File Prior to Visit   Medication Sig Dispense Refill   ??? ADALIMUMAB PEN CITRATE FREE 40 MG/0.4 ML Inject 0.4 mL (40 mg total) under the skin every fourteen (14) days. 6 each 1   ??? celecoxib (CELEBREX) 200 MG capsule Take 1 capsule (200 mg total)  by mouth Two (2) times a day. 180 capsule 3   ??? lamoTRIgine 25 mg (35) DsPk 25 mg daily.      ??? levothyroxine (SYNTHROID, LEVOTHROID) 25 MCG tablet Take 1.5 tablets (37.5 mcg total) by mouth daily. 90 tablet 3   ??? MEDICAL SUPPLY ITEM Please provide patient with 2 left carpal tunnel syndrome braces for wrists. 2 Units 0   ??? omeprazole (PRILOSEC) 40 MG capsule Take 40 mg by mouth.     ??? propranolol (INDERAL) 20 MG tablet 20 mg Two (2) times a day.        No current facility-administered medications on file prior to visit.        Blood pressure 127/77, pulse 78, height 154.9 cm (5' 0.98), weight (!) 147.1 kg (324 lb 6.4 oz), unknown if currently breastfeeding.  Body mass index is 61.33 kg/m??.      Exam:  AAO x3  Respiratory:clear to auscultation  Gastrointestinal: NAD  Ext: No pedal edema      Plan on 04/11/2018  Based on my assessment of the possible etiologies for the patient's obesity, medical co-morbidities, my recommendations include the following :  ????????Diet quality and patterning: Based on 24-hour dietary recall patient tends to eat processed foods on some occasions.  Today she was given information about following a Mediterranean diet and will continue to follow-up with me.  If she appears to be struggling with better food choices, I will refer her to the medical weight program dietitian for follow-up.  ??  ????????Exercise:???? At this time her physical activity is very low.  She is however willing to start walking and we have talked about aiming for 10 to 15 minutes 3 times a week.??  ??      Sleep: Sleep is affected by her waking up multiple times at night to use the bathroom.  We will address sleep hygiene in future.  She also says that she has not been using the CPAP due to lack of filters.  Since she now has insurance coverage, she is willing to look into getting filters for the machine.  ??  ???? Stress management: Not an issue at this time.  Will reassess in future.  ????   ???? Weight gain causing medications: Patient has been on agents which her weight gain promoting in the past.  This includes her psychiatric medication.  She however has stopped using all her psychiatry medications but will need to reestablish with a new psychiatrist before starting a new regimen.  ??  ???? Anti Obesity Medications: A few anti-obesity medications may be appropriate for use for the patient.  Since it is possible that some of the weight gain occurred due to psychotropic medication, metformin or topiramate may be good choices.  I would not want to use phentermine due to her anxiety and palpitations.  Bupropion also would need to be held off unless she has regular psychiatry follow-up.  ??  ??  Obesity Surgery: Patient clearly meets criteria for benefit from bariatric surgery and is interested in going forward with surgery.  Her insurance will be changing in the new year and it is unclear if she will need 1 year of follow-up for medical weight loss or 6 months.    Plan on 05/12/2018  Patient appeared to be doing well with the metformin and the lamotrigine combination.  She did however have a short-term setback needing prednisone which may have caused fluid retention and increased weight.  She will continue on the metformin and  lamotrigine and will return to see Marcelle Smiling in 4 weeks.    Plan on 06/12/2018  Discontinue Metformin due to diarrhea, nausea and no weight loss. I discussed trying Topiramate. Discussed side effects of topiramate in detail with patient including paresthesias, drowsiness, cognitive slowing, kidney stones and glaucoma.She is aware that topiramate is not an FDA approved for weight loss. Advised patient contraception is necessary when using the medication. Patient had a tubal ligation. Start Topiramate 25mg  in the evening Then increase to 50mg  in the evening after 2 weeks. Patient instructed to call or send MyChart message if any problems arise. Follow up in 1 month. patient voiced understanding and agreement.       Counselling  A total of 25 minutes (99214) minutes spent face-to-face with the patient, of which greater than 50% was spent counseling/coordinating care regarding obesity and management of related comorbidities.

## 2018-06-12 NOTE — Unmapped (Signed)
[facility]  OUTPATIENT PHYSICAL THERAPY  Treatment Note  06/12/2018    Patient Name: Jacqueline Hall  Date of Birth:22-Dec-1985  Session: 3  Diagnosis:   Encounter Diagnoses   Name Primary?   ??? Sacroiliitis, not elsewhere classified (CMS-HCC) Yes   ??? Weakness    ??? BMI 60.0-69.9, adult (CMS-HCC)        Onset of Symptoms: 09/08/15  Date of Evaluation: 04/23/18     Dates of Certification: 04/23/18 - 07/16/18       Problem List: Decreased strength, Decreased range of motion, Pain, Impaired tone, Core weakness, Urinary incontinence    ASSESSMENT:    Assessment: Good ability to progress exercises with focus on hip ABD, ER, and EXT for improved SIJ stability and force closure. Would continue to benefit from further strengthening and lumbopelvic stabilization as able.      Goals:      Short Term Goals (6 weeks)  ?? Hip ER strength 5/5 for improved femoroacetabular stability and decreased strain on obturator internus during weight bearing activities.  ?? Trendelenburg with single limb stance eliminated for improved LE alignment with ambulation and ascending/descending stairs for continued pelvic floor muscle stability.  ??  Long Term Goals (12 weeks)  ?? Patient able to sit on hard surface for 60 minutes with </= 6/10 pain for improved ability to return to PLOF and work without pain.  ?? Patient able to walk for 45 minutes with </= 6/10 pain for improved ability to resume PLOF and maintain activity for improved weight loss.  ?? Pt independent with HEP for self-management of sxs.  ??    PLAN:  1x week   for Duration: 12 weeks decreasing frequency as able         SUBJECTIVE:  Subjective : Still having the R SIJ pain that comes on about daily. Typically happens at night when she lays down (on her left side) and after awhile will start to ache. Will also have R SIJ pain with prolonged sitting, prolonged standing, and towards the end of her 2.5 lap walk in the mall at lunch.   Precautions: None Pain: Yes  Location: R SIJ  Pain Frequency: Intermittent  Current Pain Level: 3          OBJECTIVE:    Therapeutic Exercise x 40' total:  Pelvic Alignment:  PSIS: level  Sacral [place]: L SS stuck forward w/ minimal mobility appreciated    ?? Hooklying resisted hip flexion w/ level I TB 3 x 10 reps  ?? Hooklying hip ER/ABD w/ level I TB 10 reps ea unilaterally + 10 reps bilaterally x 3 sets total. Patient demonstrates appropriately and expresses verbal understanding therefore added to HEP, h/o given  ?? hooklying piriformis stretch x 60 hold. Patient demonstrates appropriately and expresses verbal understanding therefore added to HEP, h/o given  ?? 4-way hip w/o resistance x 10 reps ea w/ visible R hip drop w/ prolonged SLS    ??  Special Tests/Clearing Screen:  ?? Hip & SIJ Screen:   ? R:  (-) Scour, P4, FABER, FADIR  ? L:  (+) Scour & P4 w/ pain to L low back  ?? MSI Screen: (+) L ant fem glide  ??  External Muscle/Ligament Palpation:    TTP B QL, B piriformis, L Glut Max  ??  Strength:  L hip flexion 2/5 MMT  R hip flexion 3+/5 MMT  ??  B hip ER = 5/5 MMT  B hip IR =  5/5 MMT  ??    H/o: Hooklying hip ER/ABD w/ level I TB 30 reps ea unilaterally + 30 reps bilaterally, piriformis stretch     HEP:  OI retraining w/ leg bent  Bridge w/ gait belt around distal thigh 2 x 10 reps    Education Provided: Role of therapy in Rehabiliation, importance of therapy, HEP, body mechanics, body awareness, treatment options and plan, symptom management Education results: Verbalized understanding, demonstrated understanding    Communication/Consultation: n/a      Treatment Rendered:    Total Treatment Time = 40 min (3TE x 40 min)     I attest that I have reviewed the above information.  Signed: Lajean Silvius PT, DPT  06/12/18 3:54 PM

## 2018-06-20 ENCOUNTER — Ambulatory Visit: Admit: 2018-06-20 | Discharge: 2018-06-21 | Payer: MEDICAID | Attending: Family | Primary: Family

## 2018-06-20 DIAGNOSIS — Z713 Dietary counseling and surveillance: Secondary | ICD-10-CM

## 2018-06-20 DIAGNOSIS — Z6841 Body Mass Index (BMI) 40.0 and over, adult: Principal | ICD-10-CM

## 2018-06-20 DIAGNOSIS — G43809 Other migraine, not intractable, without status migrainosus: Secondary | ICD-10-CM

## 2018-06-20 DIAGNOSIS — D509 Iron deficiency anemia, unspecified: Secondary | ICD-10-CM

## 2018-06-20 DIAGNOSIS — G4733 Obstructive sleep apnea (adult) (pediatric): Secondary | ICD-10-CM

## 2018-06-20 LAB — COMPREHENSIVE METABOLIC PANEL
ALBUMIN: 4 g/dL (ref 3.5–5.0)
ALKALINE PHOSPHATASE: 59 U/L (ref 38–126)
ALT (SGPT): 22 U/L (ref <35)
ANION GAP: 9 mmol/L (ref 7–15)
AST (SGOT): 25 U/L (ref 14–38)
BILIRUBIN TOTAL: 0.2 mg/dL (ref 0.0–1.2)
BLOOD UREA NITROGEN: 21 mg/dL (ref 7–21)
BUN / CREAT RATIO: 27
CALCIUM: 9.7 mg/dL (ref 8.5–10.2)
CHLORIDE: 105 mmol/L (ref 98–107)
CO2: 24 mmol/L (ref 22.0–30.0)
CREATININE: 0.77 mg/dL (ref 0.60–1.00)
EGFR CKD-EPI NON-AA FEMALE: >90 mL/min/{1.73_m2} (ref >=60)
GLUCOSE RANDOM: 95 mg/dL (ref 65–179)
POTASSIUM: 4.7 mmol/L (ref 3.5–5.0)
PROTEIN TOTAL: 6.7 g/dL (ref 6.5–8.3)
SODIUM: 138 mmol/L (ref 135–145)

## 2018-06-20 LAB — CBC
HEMATOCRIT: 41.7 % (ref 36.0–46.0)
HEMOGLOBIN: 13.7 g/dL (ref 13.5–16.0)
MEAN CORPUSCULAR HEMOGLOBIN CONC: 32.8 g/dL (ref 31.0–37.0)
MEAN PLATELET VOLUME: 6.9 fL — ABNORMAL LOW (ref 7.0–10.0)
PLATELET COUNT: 483 10*9/L — ABNORMAL HIGH (ref 150–440)
RED BLOOD CELL COUNT: 5 10*12/L (ref 4.00–5.20)
RED CELL DISTRIBUTION WIDTH: 15.9 % — ABNORMAL HIGH (ref 12.0–15.0)
WBC ADJUSTED: 12.3 10*9/L — ABNORMAL HIGH (ref 4.5–11.0)

## 2018-06-20 LAB — EGFR CKD-EPI AA FEMALE: Lab: >90

## 2018-06-20 LAB — IRON & TIBC
IRON: 45 ug/dL (ref 35–165)
TOTAL IRON BINDING CAPACITY (CALC): 316.9 mg/dL (ref 252.0–479.0)

## 2018-06-20 LAB — THYROID STIMULATING HORMONE: Thyrotropin:ACnc:Pt:Ser/Plas:Qn:: 2.529

## 2018-06-20 LAB — PLATELET COUNT: Lab: 483 — ABNORMAL HIGH

## 2018-06-20 LAB — VITAMIN B-12: Cobalamins:MCnc:Pt:Ser/Plas:Qn:: 399

## 2018-06-20 LAB — TOTAL IRON BINDING CAPACITY (CALC): Lab: 316.9

## 2018-06-20 LAB — HEMOGLOBIN A1C: Hemoglobin A1c/Hemoglobin.total:MFr:Pt:Bld:Qn:: 5.8 — ABNORMAL HIGH

## 2018-06-20 NOTE — Unmapped (Signed)
Slow-Fe iron daily    Call Charlotte Crumb   Or [phone] ask For Karen Kitchens    Schedule to see Fannie Knee, then the class    Then see Psych        Pre-Operative Dietary Goals  ? Follow a consistent meal pattern. Eat 3 small meals and 2 snacks.   o Do not skip meals and do not graze.  o Do not go long periods of time (more than 4 hr) without eating.  ? Have protein with every meal. Aim for 60-75 g daily.  ? Eat 2-4 servings of vegetables a day and 2 servings of fruit a day  ? Drink 100 oz of water daily.  ? Avoid all sugary drinks (Ex: juice, soda (even Sprite & Ginger Ale), sweet tea).  ? Avoid carbonated beverages.  ? Practice not drinking with meals, wait 30 minutes after the meal before drinking.  ? Start a chewable multivitamin before surgery  ? Exercise ??? Aim for 30 minutes, most days of the week.  ? Keep a food journal.  ? Eat small, use a small utensil, eat slowly and chew well. Eat from a saucer, measure it out. After surgery no > 3-4 oz. at a time  ? Have a friend to call who supports you, or come to [place], or FB page [facility] Support Group    Read about the surgery of your choice, be sure you understand it, ask questions

## 2018-06-20 NOTE — Unmapped (Signed)
PCP: ALEXANDER J KARAMALEGOS, DO    CHIEF COMPLAINT:  Jacqueline Hall presents in follow up for medically monitored diet and exercise assistance visit # 4, month #3.  Initial weight: 321 lbs, she started the bariatric surgery process on 04/11/18.    HISTORY OF PRESENT ILLNESS:  She is here for medical weight loss assistance prior to bariatric surgery  to help treat obesity.   Co-morbidities include:  migraine disorder, sacroiliitis, iron deficiency anemia, vitamin D deficiency.  Weight change: +3 weight loss..  Wt Readings from Last 6 Encounters:   06/20/18 (!) 147.3 kg (324 lb 12.8 oz)   06/12/18 (!) 147.1 kg (324 lb 6.4 oz)   05/12/18 (!) 145.3 kg (320 lb 6.4 oz)   04/29/18 (!) 146 kg (321 lb 12.8 oz)   04/11/18 (!) 145.7 kg (321 lb 3.2 oz)   03/07/18 (!) 144.7 kg (319 lb 0.1 oz)       Health issues:  She gained 40 lbs from late summer to fall  Since here she saw Dr Judie Petit. He put her on metformin. She got diarrhea from metformin. So it was stopped.   She is prediabetic.   They put her on Topamax. She notes no side effects. No brain fog, or paresthesias.     EATING HABITS:  She describes her eating habits as: good.  She feels like her portions are too big.   She has a protein shake for am finds it easier than fixing a meal. Add s fruit to it.  Lunch is typically grilled chicken with veggies or a salad  Dinner is a protein shake  Snacks: peanuts, yogurt, cheese (Cheats: chips, PB crackers, tortilla chips w/salsa).   Jacqueline Hall is focusing on a protein rich diet: protein sources come from: whey, poultry, peanuts, dairy- cheese, yogurt.  Aims for 60++ g/day.   Vegetable intake is salads and broccoli, cauliflower, green beans, B. sprouts.   Carbohydrate intake: fruit and chips/crackers. Some rice. May be brown rice.    She is not practicing eating habit changes (not drinking with meals, avoiding carbonation, practicing mindfulness- such as listening to body's cues- feelings of fullness, satiety, eating slower). Beverages: water only, aims for 60++ oz of low calorie fluids a day.   Eating out: 4 x in a week (lunch)  Vitamins: none. HAS iron deficiency.   Alcohol: none. Smoking: denies.    PHYSICAL ACTIVITY:  For exercise, she is started walking 5000 spd. Smart watch.    Exercise tolerance: good.    SLEEP:  Sleep: 9 hours per night. Sleep is restful usually, broken at times, she gets up to void 1 x a night. She stopped drinking 2 hours before bed.   Has morning headaches 4 out of 7 days.  Denies daytime drowsiness.  Does not use sleep aides.  Has a Hx Sleep apnea .  Cpap Use: 3 nights a week.  PSG: 08/25/16    GI SYMPTOMS:  She is not experiencing gerd, dysphagia, abdominal pain.   Only has heartburn sometimes caused by salsa, lime, cilantro, onion, banana.   Bowel habits: 3 stools a day, firm.   PPI use: omeprazole qpm.  EGD: 07/17/16        - Normal esophagus.                     - Z-line irregular, 34 cm from the incisors.                     -  Normal stomach.                     - Normal examined duodenum.                     - No specimens collected    PSYCHOSOCIAL:  Mood: good today, has a hx of bipolar   States home life is good.  Meds: lamictal 100 mg qd  Therapy: she is not currently in therapy.   Support: husband. Mom, friends whom she would call  Psych eval: done in past with C. Forneris 05/02/16  Chart review: shows a suicidal ideation in May 2019 (wanted to drive off a bridge)    Surgical Understanding:  Surgery: Sleeve  Requirements: pending insurance check    RELEVANT TESTS/LAB RESULTS:  Lab Results   Component Value Date    WBC 12.3 (H) 06/20/2018    HGB 13.7 06/20/2018    HCT 41.7 06/20/2018    PLT 483 (H) 06/20/2018    CHOL 160 05/02/2016    TRIG 121 05/02/2016    HDL 31 (L) 05/02/2016    ALT 22 06/20/2018    AST 25 06/20/2018    NA 138 06/20/2018    K 4.7 06/20/2018    CL 105 06/20/2018    CREATININE 0.77 06/20/2018    BUN 21 06/20/2018    CO2 24.0 06/20/2018    TSH 2.520 08/01/2016    GLUF 111 (H) 12/06/2014     Lab Results   Component Value Date    VITAMINB12 516 05/02/2016       PAST MEDICAL HISTORY:  Past Medical History:   Diagnosis Date   ??? Abnormal Pap smear of cervix 2013    per patient; biopsy was done and then everything was fine   ??? Anemia 2013   ??? Ankylosing spondylitis (CMS-HCC)    ??? Arthritis 2018   ??? Bipolar 1 disorder (CMS-HCC) 2018   ??? Depression    ??? Disease of thyroid gland    ??? Elevated blood-pressure reading without diagnosis of hypertension    ??? Headache(784.0)     more than 2-3 times a week; takes Ibuprofen. could not afford prescription medication   ??? History of pre-eclampsia in prior pregnancy, currently pregnant 11/2011   ??? Hypertension    ??? Menorrhagia    ??? Morbid obesity with BMI of 50.0-59.9, adult (CMS-HCC)    ??? Obesity    ??? PTSD (post-traumatic stress disorder) 2018   ??? Sleep apnea        PAST SURGICAL HISTORY:  Past Surgical History:   Procedure Laterality Date   ??? CHOLECYSTECTOMY  12/20/2007    Initially done L/S then had some sort of complication, required endoscopy   ??? PR UPPER GI ENDOSCOPY,DIAGNOSIS N/A 07/17/2016    Procedure: UGI ENDO, INCLUDE ESOPHAGUS, STOMACH, & DUODENUM &/OR JEJUNUM; DX W/WO COLLECTION SPECIMN, BY BRUSH OR WASH;  Surgeon: Annie Paras, MD;  [facility];  Service: Gastroenterology   ??? TUBAL LIGATION         SOCIAL HISTORY:  Social History     Patient does not qualify to have social determinant information on file (likely too young).   Social History Narrative    Married 2 children, 1 stepchild.    Previously worked at [employer] in [employer], currently (09/05/2016) unemployed.    Lives in [place].    Has tubal ligation.      Social History  Tobacco Use   Smoking Status Never Smoker   Smokeless Tobacco Never Used       FAMILY HISTORY:  Family History   Problem Relation Age of Onset   ??? Diabetes Maternal Grandmother    ??? Hypertension Maternal Grandmother    ??? Heart disease Maternal Grandmother    ??? Dementia Maternal Grandmother great grandmother   ??? Heart attack Maternal Grandmother    ??? Osteoporosis Maternal Grandmother         grandfathers mother   ??? Stroke Maternal Grandmother    ??? No Known Problems Mother    ??? Hypertension Father    ??? Hypertension Sister    ??? Hypertension Brother    ??? Heart disease Maternal Grandfather    ??? Atrial fibrillation Paternal Grandmother    ??? Heart failure Paternal Grandmother    ??? Hypothyroidism Maternal Aunt    ??? Migraines Maternal Aunt    ??? Multiple sclerosis Maternal Uncle    ??? Breast cancer Neg Hx    ??? Ovarian cancer Neg Hx        MEDICATIONS:  Outpatient Encounter Medications as of 06/20/2018   Medication Sig Dispense Refill   ??? elagolix (ORILISSA) 150 mg Tab Take 150 mg by mouth daily.     ??? ADALIMUMAB PEN CITRATE FREE 40 MG/0.4 ML Inject 0.4 mL (40 mg total) under the skin every fourteen (14) days. 6 each 1   ??? celecoxib (CELEBREX) 200 MG capsule Take 1 capsule (200 mg total) by mouth Two (2) times a day. 180 capsule 3   ??? lamoTRIgine 25 mg (35) DsPk 25 mg daily.      ??? levothyroxine (SYNTHROID, LEVOTHROID) 25 MCG tablet Take 1.5 tablets (37.5 mcg total) by mouth daily. 90 tablet 3   ??? MEDICAL SUPPLY ITEM Please provide patient with 2 left carpal tunnel syndrome braces for wrists. 2 Units 0   ??? omeprazole (PRILOSEC) 40 MG capsule Take 40 mg by mouth.     ??? propranolol (INDERAL) 20 MG tablet 20 mg Two (2) times a day.      ??? topiramate (TOPAMAX) 25 MG tablet Take 2 tablets (50 mg total) by mouth nightly. Start 1 tab in the evening, then increase to 2 tabs after two weeks. 30 tablet 2     No facility-administered encounter medications on file as of 06/20/2018.        ALLERGIES:  Patient has no known allergies.      REVIEW OF SYMPTOMS:  A ten-point ROS was performed and is negative except for the pertinent findings in the HPI.    PHYSICAL EXAM:  BP 137/81 (BP Site: R Wrist, BP Position: Sitting, BP Cuff Size: Medium)  - Pulse 77  - Temp 36.7 ??C (98 ??F) (Oral)  - Ht 154.9 cm (5' 0.98)  - Wt (!) 147.3 kg (324 lb 12.8 oz)  - SpO2 93%  - BMI 61.40 kg/m??   Body Measurements in inches: Neck: 18, Chest: 56.5 Waist 58 inches. Hips: 65 inches Arm: 19.5, Thigh 30.5  Body Fat Percentage: >50%  Alert, pleasant  In no acute distress  Resp Reg and Easy  Ambulates well without assistance    ASSESSMENT:  1. Class 3 Super Obesity  Encounter Diagnoses   Name Primary?   ??? BMI 60.0-69.9, adult (CMS-HCC) Yes   ??? Obstructive sleep apnea    ??? Encounter for weight loss counseling    ??? Iron deficiency anemia, unspecified iron deficiency anemia type    ??? Other migraine  without status migrainosus, not intractable      Mifflin-St Jeor BMR (calories per day):  2100    Plan:  Jacqueline Hall is an acceptable candidate for weight loss surgery and is deemed medically necessary based on : BMI, OSA.    She has initiated a medically monitored diet and exercise program, as of 04/11/18, and she continues to express interest in weight loss surgery. She is on Topamax 25 mg qhs per Bariatric Medicine.     Her current understanding of the surgery, surgical process is improving.     We reviewed her medical history, IBW, eating habits, exercise habits, sleep habits, and reviewed her progress.     Gallbladder absent   Genella Rife no   Diabetes Lab Results   Component Value Date    A1C 5.4 05/02/2016      Fertility status tubal   Surgery Requested/chosen sleeve     She will  need to increase exercising outside of her daily routine.  This can include walking, chair exercises, cardio, weights, bands, and resistance training as tolerated. Goal 10k steps a day, or 150 minutes a week.     Encouraged healthy sleep habits/sleep hygiene: such as going to bed around the same time nightly and waking around the same time each morning. Limit screen time before bed x 1 hour minimum. Consider meditation, relaxation, yoga.   May use melatonin at HS.   Must wear CPAP nightly.    She will follow the RD recommendations, seen on 04/11/18, see note for full details. Start Slow Fe 1 a day. Sample [employer] 1 chew daily.     Preoperative Bariatric Surgery education:  Gastric Sleeve and Gastric Bypass.  ?? Benefits and results  ?? Reviewed anatomical changes, realistic weight loss expectations  ?? Eating Habit Changes (Mindfulness, avoidance of liquid calories, eat slowly, measure portions, incorporate lean proteins. Limits simple carbohydrates, fast foods, and processed foods)  ?? Postoperative Information:   (length of stay, time off from work, anticipated weight loss, realistic expectations, physical activity requirements,   ?? Risks, common side effects:    (post operative pain, constipation, nausea, reflux, vitamin deficiency risk, dehydration, dumping syndrome, hair loss, skin laxity, vitamin and nutritional supplements, long term follow up care)  ?? Avoidance of nicotine, alcohol, blood thinners or NSAIDS    Preoperative requirements:  Insurance [employer]       Completed Pending Other   Supervised Diet Started 04/11/18     EGD 07/17/16     Nutrition Class  x    RD clearance  x    Psych evaluation Needs update from 2017     Letter of support - psych (if needed) x     Labs 06/20/2018     Urine tox/nicotine 06/20/2018   non smoker   PSG (polysomnogram) 08/25/16     Support Groups online     Referral        Recommend:   1.  Attend support group, or join online   2.  Schedule Nutrition Class   3.  Schedule to attend the nutrition class, then RD follow-up.   4.  Get an updated psychological evaluation with Dr. Vertis Kelch. Begin Psychotherapy.       She was reminded, and understands, the need to see PCP for routine health care needs outside of Bariatric Surgery/Obesity Management.     Return to Clinic: 1 month    Joretta Eads L. Ladajah Soltys MSN, FNP APRN-BC  [facility]- Dept of GI Surgery: Bariatric Surgery  06/20/2018      See patient instructions      Counselling  Total visit duration 25 min. >50% of the visit was used to counsel patient.

## 2018-06-23 LAB — VITAMIN D 25 HYDROXY: VITAMIN D, TOTAL (25OH): 18.2 ng/mL — ABNORMAL LOW (ref 20.0–80.0)

## 2018-06-23 LAB — VITAMIN D, TOTAL (25OH): Lab: 18.2 — ABNORMAL LOW

## 2018-06-24 DIAGNOSIS — F3181 Bipolar II disorder: Secondary | ICD-10-CM | POA: Diagnosis not present

## 2018-06-24 LAB — COTININE: Cotinine:MCnc:Pt:Ser/Plas:Qn:: <3

## 2018-06-25 ENCOUNTER — Encounter: Payer: Self-pay | Admitting: Obstetrics and Gynecology

## 2018-06-25 ENCOUNTER — Ambulatory Visit (INDEPENDENT_AMBULATORY_CARE_PROVIDER_SITE_OTHER): Payer: BLUE CROSS/BLUE SHIELD | Admitting: Obstetrics and Gynecology

## 2018-06-25 VITALS — BP 142/85 | HR 106 | Ht 61.0 in | Wt 321.2 lb

## 2018-06-25 DIAGNOSIS — R102 Pelvic and perineal pain: Secondary | ICD-10-CM

## 2018-06-25 DIAGNOSIS — N946 Dysmenorrhea, unspecified: Secondary | ICD-10-CM | POA: Diagnosis not present

## 2018-06-25 DIAGNOSIS — N939 Abnormal uterine and vaginal bleeding, unspecified: Secondary | ICD-10-CM | POA: Diagnosis not present

## 2018-06-25 NOTE — Progress Notes (Signed)
Pt is present today for medication mgmt and chronic pelvic pain since 2017.

## 2018-06-25 NOTE — Progress Notes (Signed)
    GYNECOLOGY PROGRESS NOTE  Subjective:    Patient ID: Leah Osborne, female    DOB: 07/17/1985, 33 y.o.   MRN: [id number]  HPI  Patient is a 33 y.o. [id number] morbidly obese female who presents for 1 month follow up of dysmenorrhea, abnormal uterine bleeding, and chronic pelvic pain.  Was previously seen by Dr. She was prescribed Freida Busman last visit, however notes that the medication was denied by her insurance.  Reports that she was given samples of the medication and just initiated 9 days ago.   The following portions of the patient's history were reviewed and updated as appropriate: allergies, current medications, past family history, past medical history, past social history, past surgical history and problem list.  Review of Systems Pertinent items noted in HPI and remainder of comprehensive ROS otherwise negative.   Objective:   Blood pressure (!) 142/85, pulse (!) 106, height 5\' 1"  (1.549 m), weight (!) 321 lb 3.2 oz (145.7 kg), last menstrual period 06/18/2018. General appearance: alert and no distress Remainder of exam deferred.    Assessment:   1. Severe dysmenorrhea 2. Abnormal uterine bleeding 3. Chronic pelvic pain 4. Morbid obesity  Plan:   1. Continue with use of Orilissa for management of bleeding and pelvic pain.  Will follow up in 4 more weeks after patient has had an adequate trial of the medication.   2. Continue with Ibuprofen and Tylenol for severe dysmenorrhea. Should also be helped with Freida Busman. 3. Morbid obesity - patient notes that she is planning on bariatric surgery soon. Note that this may also help with her abnormal bleeding.    Rubie Maid, MD [facility]

## 2018-07-03 MED ORDER — TOPIRAMATE 50 MG TABLET
ORAL_TABLET | Freq: Every evening | ORAL | 3 refills | 0.00000 days | Status: CP
Start: 2018-07-03 — End: 2018-11-20

## 2018-07-03 NOTE — Unmapped (Signed)
Jacqueline Hall.   ????The Generic for Topamax, i picked up refilled perscription last night and it was only 30 pills. Meaning with taking 2 a night its only going to last me 15 days. ??In about 10 can you send a new one over for 60 pills?                    I increased dose to 50 mg qpm, resent updated Rx.  [name]  07/03/2018

## 2018-07-08 DIAGNOSIS — F3181 Bipolar II disorder: Secondary | ICD-10-CM | POA: Diagnosis not present

## 2018-07-10 ENCOUNTER — Encounter: Payer: Self-pay | Admitting: Internal Medicine

## 2018-07-10 ENCOUNTER — Ambulatory Visit: Payer: BLUE CROSS/BLUE SHIELD | Admitting: Internal Medicine

## 2018-07-10 VITALS — BP 113/82 | HR 75 | Ht 61.0 in | Wt 316.8 lb

## 2018-07-10 DIAGNOSIS — R002 Palpitations: Secondary | ICD-10-CM | POA: Diagnosis not present

## 2018-07-10 DIAGNOSIS — I471 Supraventricular tachycardia: Secondary | ICD-10-CM | POA: Diagnosis not present

## 2018-07-10 DIAGNOSIS — I493 Ventricular premature depolarization: Secondary | ICD-10-CM | POA: Diagnosis not present

## 2018-07-10 DIAGNOSIS — R079 Chest pain, unspecified: Secondary | ICD-10-CM | POA: Diagnosis not present

## 2018-07-10 MED ORDER — MAGNESIUM OXIDE 400 MG PO TABS: 400.0000 mg | ORAL_TABLET | Freq: Every day | ORAL | 3 refills | Status: DC

## 2018-07-10 MED ORDER — PROPRANOLOL HCL ER 60 MG PO CP24: 60.0000 mg | ORAL_CAPSULE | Freq: Every day | ORAL | 3 refills | Status: DC

## 2018-07-10 NOTE — Patient Instructions (Addendum)
Medication Instructions:  - Your physician has recommended you make the following change in your medication:   1) START inderal (propranolol LA) 60 mg- take 1 capsule by mouth once daily  2) START magnesium oxide 400 mg- take 1 tablet by mouth once daily  3) stop propranolol 20 mg tablets when you start the long acting propranolol  If you need a refill on your cardiac medications before your next appointment, please call your pharmacy.   Lab work: - none ordered  If you have labs (blood work) drawn today and your tests are completely normal, you will receive your results only by: Marland Kitchen MyChart Message (if you have MyChart) OR . A paper copy in the mail If you have any lab test that is abnormal or we need to change your treatment, we will call you to review the results.  Testing/Procedures: - Your physician has recommended that you have a CT Calcium Score  Please call [phone] to schedule [address], [id number]  $150 out of pocket- will be collected when you check in for the test  Follow-Up: At [facility], you and your health needs are our priority.  As part of our continuing mission to provide you with exceptional heart care, we have created designated Provider Care Teams.  These Care Teams include your primary Cardiologist (physician) and Advanced Practice Providers (APPs -  Physician Assistants and Nurse Practitioners) who all work together to provide you with the care you need, when you need it. . in 3 months wtih Ignacia Bayley, NP  Any Other Special Instructions Will Be Listed Below (If Applicable). - N/A

## 2018-07-10 NOTE — Progress Notes (Signed)
ELECTROPHYSIOLOGY OFFICE NOTE  Patient ID: Leah Osborne, MRN: [id number], DOB/AGE: [birth year] 31 y.o. Admit date: (Not on file) Date of Consult: 07/10/2018  Primary Physician: Olin Hauser, DO Primary Cardiologist: Meriel Flavors     Leah Osborne is a 33 y.o. female who is being seen today for the evaluation of palpitations  HPI Leah Osborne is a 33 y.o. female referred indirectly because of palpitations.  She has a complex past medical history including bipolar disorder, [facility] treated sleep apnea, hypertension, morbid obesity for which she is considering weight reduction surgery and a history over the last few years of palpitations.  They occur daily but wax and wane in their frequency and in their intensity.  She has eliminated caffeine with no change.  She was monitored 4/19.  Her ZIO Patch was personally reviewed and demonstrated frequent ventricular ectopy (about 5%) with periods of bigeminy and trigeminy.  She also had some sinus tachycardia with rates up into the 140s.  She was treated initially with metoprolol with some benefit.  Up titration became problematic because of lightheadedness and dizziness; ultimately she tried propranolol and is taking 20 mg once a day with some benefit.  Her diet is replete of fluid and deplete of sodium.  She does not use caffeine  She has some orthostatic dizziness but it is not predictable.  When she does get dizzy or presyncopal is accompanied by tachypalpitations.  It only occurs while she is upright.  It is not immediately relieved by sitting.  Symptoms are not aggravated by showers but are not by menses.  She has a strong family history of coronary disease and has had atypical chest pain.  10/19 she ended up in the emergency room.  Troponins were negative.  Calcium scoring was considered  Past Medical History:  Diagnosis Date  . Abnormal Pap smear of cervix   . Arthritis   . Arthritis, rheumatoid ([place])   . Bipolar  1 disorder ([place])   . Chest pain   . Headache    MIGRAINES  . Hyperlipidemia   . Hypertension   . Palpitations    a. 09/2017 Zio Monitor: predominantly RSR, avg HR 87, 11 A tach/SVT episodes, fastest 176 x 7 beats, longest 9 beats. Rare PAC's, occas PVC's, rare couplets/triplets.  Marland Kitchen PTSD (post-traumatic stress disorder)   . PVC (premature ventricular contraction)   . Social phobia       Surgical History:  Past Surgical History:  Procedure Laterality Date  . CHOLECYSTECTOMY    . LAPAROSCOPIC TUBAL LIGATION Bilateral 09/19/2015   Procedure: LAPAROSCOPIC BILATERAL TUBAL BANDING;  Surgeon: Brayton Mars, MD;  Location: [facility] ORS;  Service: Gynecology;  Laterality: Bilateral;     Home Meds: Current Meds  Medication Sig  . Adalimumab (HUMIRA PEN [place]) Inject into the skin.  . celecoxib (CELEBREX) 200 MG capsule Take 200 mg by mouth 2 (two) times daily.  . Elagolix Sodium (ORILISSA) 150 MG TABS Take 150 mg by mouth daily.  Marland Kitchen lamoTRIgine (LAMICTAL) 100 MG tablet Take 100 mg by mouth daily.   Marland Kitchen levothyroxine (SYNTHROID, LEVOTHROID) 25 MCG tablet Take 1 tablet (25 mcg total) by mouth daily before breakfast.  . omeprazole (PRILOSEC) 40 MG capsule Take 40 mg by mouth daily.  . propranolol (INDERAL) 20 MG tablet Take 1 tab (20 mg) by mouth twice daily. May take an extra 1 tab (20 mg) by mouth once daily as needed for breakthrough palpitations  . topiramate (TOPAMAX) 25 MG  tablet Take 25 mg by mouth 2 (two) times daily.    Allergies: No Known Allergies  Social History   Socioeconomic History  . Marital status: Married    Spouse name: Not on file  . Number of children: Not on file  . Years of education: Not on file  . Highest education level: Not on file  Occupational History  . Not on file  Social Needs  . Financial resource strain: Not on file  . Food insecurity:    Worry: Not on file    Inability: Not on file  . Transportation needs:    Medical: Not on file    Non-medical:  Not on file  Tobacco Use  . Smoking status: Never Smoker  . Smokeless tobacco: Never Used  Substance and Sexual Activity  . Alcohol use: Not Currently    Alcohol/week: 0.0 standard drinks  . Drug use: No  . Sexual activity: Yes    Birth control/protection: Surgical    Comment: tubal  Lifestyle  . Physical activity:    Days per week: Not on file    Minutes per session: Not on file  . Stress: Not on file  Relationships  . Social connections:    Talks on phone: Not on file    Gets together: Not on file    Attends religious service: Not on file    Active member of club or organization: Not on file    Attends meetings of clubs or organizations: Not on file    Relationship status: Not on file  . Intimate partner violence:    Fear of current or ex partner: Not on file    Emotionally abused: Not on file    Physically abused: Not on file    Forced sexual activity: Not on file  Other Topics Concern  . Not on file  Social History Narrative  . Not on file     Family History  Problem Relation Age of Onset  . Diabetes Maternal Grandmother   . Heart disease Maternal Grandmother   . Heart failure Maternal Grandmother   . Heart attack Maternal Grandmother   . Rheum arthritis Mother   . Healthy Father   . Heart Problems Maternal Grandfather   . Atrial fibrillation Paternal Grandmother   . Cancer Neg Hx      ROS:  Please see the history of present illness.     All other systems reviewed and negative.    Physical Exam: Blood pressure 113/82, pulse 75, height 5\' 1"  (1.549 m), weight (!) 316 lb 12 oz (143.7 kg), last menstrual period 06/18/2018. General: Well developed, well nourished female in no acute distress. Head: Normocephalic, atraumatic, sclera non-icteric, no xanthomas, nares are without discharge. EENT: normal  Lymph Nodes:  none Neck: Negative for carotid bruits. JVD not elevated. Back:without scoliosis kyphosis Lungs: Clear bilaterally to auscultation without wheezes,  rales, or rhonchi. Breathing is unlabored. Heart: RRR with S1 S2. No murmur . No rubs, or gallops appreciated. Abdomen: Soft, non-tender, non-distended with normoactive bowel sounds. No hepatomegaly. No rebound/guarding. No obvious abdominal masses. Msk:  Strength and tone appear normal for age. Extremities: No clubbing or cyanosis. No  edema.  Distal pedal pulses are 2+ and equal bilaterally. Skin: Warm and Dry Neuro: Alert and oriented X 3. CN III-XII intact Grossly normal sensory and motor function . Psych:  Responds to questions appropriately with a normal affect.      Labs: Cardiac Enzymes No results for input(s): CKTOTAL, CKMB, TROPONINI in the  last 72 hours. CBC Lab Results  Component Value Date   WBC 14.8 (H) 03/24/2018   HGB 13.4 03/24/2018   HCT 40.8 03/24/2018   MCV 81.4 03/24/2018   PLT 475 (H) 03/24/2018   PROTIME: No results for input(s): LABPROT, INR in the last 72 hours. Chemistry No results for input(s): NA, K, CL, CO2, BUN, CREATININE, CALCIUM, PROT, BILITOT, ALKPHOS, ALT, AST, GLUCOSE in the last 168 hours.  Invalid input(s): LABALBU Lipids Lab Results  Component Value Date   CHOL 151 02/10/2018   HDL 34 (L) 02/10/2018   LDLCALC 98 02/10/2018   TRIG 97 02/10/2018   BNP No results found for: PROBNP Thyroid Function Tests: No results for input(s): TSH, T4TOTAL, T3FREE, THYROIDAB in the last 72 hours.  Invalid input(s): FREET3 Miscellaneous No results found for: DDIMER  Radiology/Studies:  No results found.  EKG: Sinus rhythm at 75 Interval 13/08/37 Otherwise normal  ZIO Patch as noted above   Assessment and Plan:  Palpitations bradycardia and tachypalpitations  Lightheadedness  Morbid obesity with consideration for weight reduction surgery  Sleep apnea-treated   The patient's ZIO Patch was quite elucidating as relates to her symptoms.  She has some degree of sinus tachycardia associated with palpitations; she also had flutters  associated with her ventricular ectopy which also resulted in patterns of bigeminy or trigeminy.  I suspect this is responsible for the monitoring on her pulse oximeter of heart rates in the 30s and 40s as well as a sinus tachycardia.  As there is noes significant intrinsic bradycardia, we will increase her propranolol which she is tolerating well to 60 LA daily.  In the event that symptoms persist, could use a calcium blocker and or an antiarrhythmic drug like flecainide  Because of the consideration of weight reduction surgery and her strong family history of coronary disease and anxiety related to its presence will undertake calcium scoring.       Virl Axe '

## 2018-07-16 ENCOUNTER — Telehealth: Payer: Self-pay | Admitting: Cardiovascular Disease

## 2018-07-16 NOTE — Telephone Encounter (Signed)
   [facility] Pre-operative Risk Assessment    Request for surgical clearance:  1. What type of surgery is being performed? Bariatric surgery  2. When is this surgery scheduled? Not listed  3. What type of clearance is required (medical clearance vs. Pharmacy clearance to hold med vs. Both)? Not listed  4. Are there any medications that need to be held prior to surgery and how long? Not listed   5. Practice name and name of physician performing surgery? [facility] [place], Jolaine Click, FNP  6. What is your office phone number [phone]   7.   What is your office fax number [phone]  8.   Anesthesia type (None, local, MAC, general) ? Not listed   Leah Osborne 07/16/2018, 10:18 AM  _________________________________________________________________   (provider comments below)

## 2018-07-17 NOTE — Telephone Encounter (Signed)
Acceptable risk for surgery No further testing needed No meds to hold

## 2018-07-18 NOTE — Telephone Encounter (Signed)
Routed to number provided via EPIC fax.  

## 2018-07-23 ENCOUNTER — Encounter: Payer: Self-pay | Admitting: Obstetrics and Gynecology

## 2018-07-23 ENCOUNTER — Ambulatory Visit (INDEPENDENT_AMBULATORY_CARE_PROVIDER_SITE_OTHER): Payer: BLUE CROSS/BLUE SHIELD | Admitting: Obstetrics and Gynecology

## 2018-07-23 VITALS — BP 109/74 | HR 59 | Ht 61.0 in | Wt 317.5 lb

## 2018-07-23 DIAGNOSIS — N808 Other endometriosis: Secondary | ICD-10-CM | POA: Diagnosis not present

## 2018-07-23 DIAGNOSIS — L680 Hirsutism: Secondary | ICD-10-CM | POA: Diagnosis not present

## 2018-07-23 DIAGNOSIS — N946 Dysmenorrhea, unspecified: Secondary | ICD-10-CM

## 2018-07-23 DIAGNOSIS — N926 Irregular menstruation, unspecified: Secondary | ICD-10-CM

## 2018-07-23 DIAGNOSIS — G8929 Other chronic pain: Secondary | ICD-10-CM

## 2018-07-23 DIAGNOSIS — R102 Pelvic and perineal pain: Secondary | ICD-10-CM | POA: Diagnosis not present

## 2018-07-23 DIAGNOSIS — Z6841 Body Mass Index (BMI) 40.0 and over, adult: Secondary | ICD-10-CM

## 2018-07-23 NOTE — Progress Notes (Signed)
    GYNECOLOGY PROGRESS NOTE  Subjective:    Patient ID: Leah Osborne, female    DOB: Dec 06, 1985, 33 y.o.   MRN: [id number]  HPI  Patient is a 33 y.o. G14P2002 female who presents for 1 month follow up.  She is currently on trial of Orilissa for suspected endometriosis symptoms.  She reports that she has experienced no further cramping except yesterday when she noted it bilaterally.  She also notes no further abnormal uterine bleeding. Overall is doing well on the medication.   Of note, patient still observing facial hair growth, wonders if she may have PCOS.  Has also been recently diagnosed as pre-diabetic.    The following portions of the patient's history were reviewed and updated as appropriate: allergies, current medications, past family history, past medical history, past social history, past surgical history and problem list.  Review of Systems Pertinent items noted in HPI and remainder of comprehensive ROS otherwise negative.   Objective:   Blood pressure 109/74, pulse (!) 59, height 5\' 1"  (1.549 m), weight (!) 317 lb 8 oz (144 kg), last menstrual period 06/24/2018. Body mass index is 59.99 kg/m.  General appearance: alert and no distress Skin: rosacea appearing on both cheeks (redness and small bumps)   Assessment:   1. Severe dysmenorrhea 2. Abnormal uterine bleeding 3. Chronic pelvic pain 4.  Morbid obesity 5. Hirsutism  Plan:   1. Patient with symptoms suggestive of endometriosis, with good response on Orilissa.  Will continue medication. Can continue for total of 2 years at current dose.  Given more samples, will work to get covered by insurance.  2. Hirsutism. Cannot completely rule out co-existence of PCOS based on symptoms of abnormal bleeding and facial hair growth.  Patient also reports recently being diagnosed with pre-diabetes.  Will order labs to rule out hyperandrogenism.  3. Patient to f/u in 2-3 months to reassess symptoms and for annual exam.     Rubie Maid, MD [facility]

## 2018-07-23 NOTE — Progress Notes (Signed)
Pt stated that she is still having cramps. Pt stated that she taking the medication as prescribed.

## 2018-07-23 NOTE — Patient Instructions (Signed)
Endometriosis  Endometriosis is a condition in which the tissue that lines the uterus (endometrium) grows outside of its normal location. The tissue may grow in many locations close to the uterus, but it commonly grows on the ovaries, fallopian tubes, vagina, or bowel. When the uterus sheds the endometrium every menstrual cycle, there is bleeding wherever the endometrial tissue is located. This can cause pain because blood is irritating to tissues that are not normally exposed to it. What are the causes? The cause of endometriosis is not known. What increases the risk? You may be more likely to develop endometriosis if you:  Have a family history of endometriosis.  Have never given birth.  Started your period at age 10 or younger.  Have high levels of estrogen in your body.  Were exposed to a certain medicine (diethylstilbestrol) before you were born (in utero).  Had low birth weight.  Were born as a twin, triplet, or other multiple.  Have a BMI of less than 25. BMI is an estimate of body fat and is calculated from height and weight. What are the signs or symptoms? Often, there are no symptoms of this condition. If you do have symptoms, they may:  Vary depending on where your endometrial tissue is growing.  Occur during your menstrual period (most common) or midcycle.  Come and go, or you may go months with no symptoms at all.  Stop with menopause. Symptoms may include:  Pain in the back or abdomen.  Heavier bleeding during periods.  Pain during sex.  Painful bowel movements.  Infertility.  Pelvic pain.  Bleeding more than once a month. How is this diagnosed? This condition is diagnosed based on your symptoms and a physical exam. You may have tests, such as:  Blood tests and urine tests. These may be done to help rule out other possible causes of your symptoms.  Ultrasound, to look for abnormal tissues.  An X-ray of the lower bowel (barium enema).  An  ultrasound that is done through the vagina (transvaginally).  CT scan.  MRI.  Laparoscopy. In this procedure, a lighted, pencil-sized instrument called a laparoscope is inserted into your abdomen through an incision. The laparoscope allows your health care provider to look at the organs inside your body and check for abnormal tissue to confirm the diagnosis. If abnormal tissue is found, your health care provider may remove a small piece of tissue (biopsy) to be examined under a microscope. How is this treated? Treatment for this condition may include:  Medicines to relieve pain, such as NSAIDs.  Hormone therapy. This involves using artificial (synthetic) hormones to reduce endometrial tissue growth. Your health care provider may recommend using a hormonal form of birth control, or other medicines.  Surgery. This may be done to remove abnormal endometrial tissue. ? In some cases, tissue may be removed using a laparoscope and a laser (laparoscopic laser treatment). ? In severe cases, surgery may be done to remove the fallopian tubes, uterus, and ovaries (hysterectomy). Follow these instructions at home:  Take over-the-counter and prescription medicines only as told by your health care provider.  Do not drive or use heavy machinery while taking prescription pain medicine.  Try to avoid activities that cause pain, including sexual activity.  Keep all follow-up visits as told by your health care provider. This is important. Contact a health care provider if:  You have pain in the area between your hip bones (pelvic area) that occurs: ? Before, during, or after your period. ?   In between your period and gets worse during your period. ? During or after sex. ? With bowel movements or urination, especially during your period.  You have problems getting pregnant.  You have a fever. Get help right away if:  You have severe pain that does not get better with medicine.  You have severe  nausea and vomiting, or you cannot eat without vomiting.  You have pain that affects only the lower, right side of your abdomen.  You have abdominal pain that gets worse.  You have abdominal swelling.  You have blood in your stool. This information is not intended to replace advice given to you by your health care provider. Make sure you discuss any questions you have with your health care provider. Document Released: 06/01/2000 Document Revised: 03/09/2016 Document Reviewed: 11/05/2015 Elsevier Interactive Patient Education  2019 [employer]    Polycystic Ovarian Syndrome  Polycystic ovarian syndrome (PCOS) is a common hormonal disorder among women of reproductive age. In most women with PCOS, many small fluid-filled sacs (cysts) grow on the ovaries, and the cysts are not part of a normal menstrual cycle. PCOS can cause problems with your menstrual periods and make it difficult to get pregnant. It can also cause an increased risk of miscarriage with pregnancy. If it is not treated, PCOS can lead to serious health problems, such as diabetes and heart disease. What are the causes? The cause of PCOS is not known, but it may be the result of a combination of certain factors, such as: Irregular menstrual cycle. High levels of certain hormones (androgens). Problems with the hormone that helps to control blood sugar (insulin resistance). Certain genes. What increases the risk? This condition is more likely to develop in women who have a family history of PCOS. What are the signs or symptoms? Symptoms of PCOS may include: Multiple ovarian cysts. Infrequent periods or no periods. Periods that are too frequent or too heavy. Unpredictable periods. Inability to get pregnant (infertility) because of not ovulating. Increased growth of hair on the face, chest, stomach, back, thumbs, thighs, or toes. Acne or oily skin. Acne may develop during adulthood, and it may not respond to  treatment. Pelvic pain. Weight gain or obesity. Patches of thickened and dark brown or black skin on the neck, arms, breasts, or thighs (acanthosis nigricans). Excess hair growth on the face, chest, abdomen, or upper thighs (hirsutism). How is this diagnosed? This condition is diagnosed based on: Your medical history. A physical exam, including a pelvic exam. Your health care provider may look for areas of increased hair growth on your skin. Tests, such as: Ultrasound. This may be used to examine the ovaries and the lining of the uterus (endometrium) for cysts. Blood tests. These may be used to check levels of sugar (glucose), female hormone (testosterone), and female hormones (estrogen and progesterone) in your blood. How is this treated? There is no cure for PCOS, but treatment can help to manage symptoms and prevent more health problems from developing. Treatment varies depending on: Your symptoms. Whether you want to have a baby or whether you need birth control (contraception). Treatment may include nutrition and lifestyle changes along with: Progesterone hormone to start a menstrual period. Birth control pills to help you have regular menstrual periods. Medicines to make you ovulate, if you want to get pregnant. Medicine to reduce excessive hair growth. Surgery, in severe cases. This may involve making small holes in one or both of your ovaries. This decreases the amount of testosterone  that your body produces. Follow these instructions at home: Take over-the-counter and prescription medicines only as told by your health care provider. Follow a healthy meal plan. This can help you reduce the effects of PCOS. Eat a healthy diet that includes lean proteins, complex carbohydrates, fresh fruits and vegetables, low-fat dairy products, and healthy fats. Make sure to eat enough fiber. If you are overweight, lose weight as told by your health care provider. Losing 10% of your body weight may  improve symptoms. Your health care provider can determine how much weight loss is best for you and can help you lose weight safely. Keep all follow-up visits as told by your health care provider. This is important. Contact a health care provider if: Your symptoms do not get better with medicine. You develop new symptoms. This information is not intended to replace advice given to you by your health care provider. Make sure you discuss any questions you have with your health care provider. Document Released: 09/28/2004 Document Revised: 01/31/2016 Document Reviewed: 11/20/2015 Elsevier Interactive Patient Education  2019 [employer].

## 2018-07-24 LAB — FOLLICLE STIMULATING HORMONE: FSH: 7.9 m[IU]/mL

## 2018-07-24 LAB — TESTOSTERONE, FREE, TOTAL, SHBG
Sex Hormone Binding: 16.5 nmol/L — ABNORMAL LOW (ref 24.6–122.0)
Testosterone, Free: 3 pg/mL (ref 0.0–4.2)
Testosterone: 55 ng/dL — ABNORMAL HIGH (ref 8–48)

## 2018-07-24 LAB — DHEA-SULFATE: DHEA-SO4: 267.2 ug/dL (ref 84.8–378.0)

## 2018-07-24 LAB — PROGESTERONE: Progesterone: 0.2 ng/mL

## 2018-07-24 LAB — INSULIN, RANDOM: INSULIN: 44.7 u[IU]/mL — AB (ref 2.6–24.9)

## 2018-07-24 LAB — PROLACTIN: Prolactin: 6.5 ng/mL (ref 4.8–23.3)

## 2018-07-25 ENCOUNTER — Ambulatory Visit: Admit: 2018-07-25 | Discharge: 2018-07-26 | Payer: MEDICAID | Attending: Registered" | Primary: Registered"

## 2018-07-25 DIAGNOSIS — Z6841 Body Mass Index (BMI) 40.0 and over, adult: Secondary | ICD-10-CM

## 2018-07-25 DIAGNOSIS — I10 Essential (primary) hypertension: Secondary | ICD-10-CM | POA: Diagnosis not present

## 2018-07-28 DIAGNOSIS — F3181 Bipolar II disorder: Secondary | ICD-10-CM | POA: Diagnosis not present

## 2018-07-28 NOTE — Unmapped (Signed)
[facility]  Bariatric Nutrition Class  Jacqueline Hall is a 33 y.o. female seen for medical nutrition therapy.    Referring Provider or Clinic:  [facility]ariat[facility]    Primary Care Provider:  Smitty Cords, DO    Reason for Referral:  Nutrition education prior to weight loss surgery    Past Medical History:  Past Medical History:   Diagnosis Date   ??? Abnormal Pap smear of cervix 2013    per patient; biopsy was done and then everything was fine   ??? Anemia 2013   ??? Ankylosing spondylitis (CMS-HCC)    ??? Arthritis 2018   ??? Bipolar 1 disorder (CMS-HCC) 2018   ??? Depression    ??? Disease of thyroid gland    ??? Elevated blood-pressure reading without diagnosis of hypertension    ??? Headache(784.0)     more than 2-3 times a week; takes Ibuprofen. could not afford prescription medication   ??? History of pre-eclampsia in prior pregnancy, currently pregnant 11/2011   ??? Hypertension    ??? Menorrhagia    ??? Morbid obesity with BMI of 50.0-59.9, adult (CMS-HCC)    ??? Obesity    ??? PTSD (post-traumatic stress disorder) 2018   ??? Sleep apnea      Past Surgical History:   Procedure Laterality Date   ??? CHOLECYSTECTOMY  12/20/2007    Initially done L/S then had some sort of complication, required endoscopy   ??? PR UPPER GI ENDOSCOPY,DIAGNOSIS N/A 07/17/2016    Procedure: UGI ENDO, INCLUDE ESOPHAGUS, STOMACH, & DUODENUM &/OR JEJUNUM; DX W/WO COLLECTION SPECIMN, BY BRUSH OR WASH;  Surgeon: Annie Paras, MD;  [facility]: [facility];  Service: Gastroenterology   ??? TUBAL LIGATION         Weight History:  Wt Readings from Last 5 Encounters:   06/20/18 (!) 147.3 kg (324 lb 12.8 oz)   06/12/18 (!) 147.1 kg (324 lb 6.4 oz)   05/12/18 (!) 145.3 kg (320 lb 6.4 oz)   04/29/18 (!) 146 kg (321 lb 12.8 oz)   04/11/18 (!) 145.7 kg (321 lb 3.2 oz)     BMI Readings from Last 5 Encounters:   06/20/18 61.40 kg/m??   06/12/18 61.33 kg/m??   05/12/18 60.57 kg/m??   04/29/18 60.84 kg/m??   04/11/18 60.72 kg/m?? Patient Education:  Discussed pre-op dietary recommendations including meal/snack consistency, eating by the clock, focusing on protein intake, appropriate food choices, choosing sugar free, noncarbonated beverages, not drinking with meals, vitamin supplementation, and regular exercise. Also discussed mindful eating strategies. Discussed the importance of incorporating these behaviors to optimize success after surgery.  We have reviewed all phases of the diet including full liquids, pureed, and soft foods. Discussed appropriate food choices and portion sizes for each diet stage. Discussed the importance of getting adequate protein (60-80g) and fluids (64 oz), the need to establish a schedule of multiple small meals in order to prevent meal skipping and grazing, the need to avoid high sugar and high fat foods and beverages to prevent dumping syndrome and optimize weight loss. Discussed the importance of choosing sugar free, noncarbonated beverages along with practicing not drinking with meals and waiting 30 minutes after the meal before drinking.   Discussed how to read a food label in order to identify appropriate food choices.   The patient was advised of the need to take supplemental vitamins and minerals to meet nutrition needs. ASMBS guidelines were followed.  The patient was provided with  information on protein supplement drinks and powders that will be helpful in meeting daily protein goals. Also, the patient was given sample menus and recipes for more ideas.    Nutrition Goals:  Demonstrate understanding of bariatric nutrition  Demonstrate behavioral change  Meet nutritional needs   Reduce long-term health risk  Compliance with nutrition goals    Materials Provided:  List of recommendations    Handout explaining prescribed diet  Tips and recipes    Expected Compliance:  Comprehension of plan is good.  Readiness for change is good.  Ability to meet goals is good.    Patient answered 8/8 questions correctly on the post-class quiz.    Follow up:  Patient will need to follow-up with RD within 4 weeks of class for goal assessment/potential approval for bariatric surgery.      Length of visit was 80 minutes.

## 2018-07-30 ENCOUNTER — Other Ambulatory Visit: Payer: Self-pay

## 2018-07-30 ENCOUNTER — Ambulatory Visit: Admit: 2018-07-30 | Discharge: 2018-07-31 | Payer: MEDICAID | Attending: Clinical | Primary: Clinical

## 2018-07-30 DIAGNOSIS — F54 Psychological and behavioral factors associated with disorders or diseases classified elsewhere: Secondary | ICD-10-CM

## 2018-07-30 DIAGNOSIS — D509 Iron deficiency anemia, unspecified: Secondary | ICD-10-CM | POA: Diagnosis not present

## 2018-07-30 DIAGNOSIS — G4733 Obstructive sleep apnea (adult) (pediatric): Secondary | ICD-10-CM | POA: Diagnosis not present

## 2018-07-30 DIAGNOSIS — Z6841 Body Mass Index (BMI) 40.0 and over, adult: Secondary | ICD-10-CM | POA: Diagnosis not present

## 2018-07-30 DIAGNOSIS — Z713 Dietary counseling and surveillance: Secondary | ICD-10-CM | POA: Diagnosis not present

## 2018-07-30 DIAGNOSIS — G43809 Other migraine, not intractable, without status migrainosus: Secondary | ICD-10-CM | POA: Diagnosis not present

## 2018-07-30 LAB — TOXICOLOGY SCREEN, URINE
AMPHETAMINE SCREEN URINE: <500
COCAINE(METAB.)SCREEN, URINE: <150
METHADONE SCREEN, URINE: <300
OPIATE SCREEN URINE: <300

## 2018-07-30 LAB — OPIATE SCREEN URINE: Lab: <300

## 2018-07-30 MED ORDER — ELAGOLIX SODIUM 150 MG PO TABS: 150.0000 mg | ORAL_TABLET | Freq: Every day | ORAL | 1 refills | Status: DC

## 2018-07-30 MED ORDER — SPIRONOLACTONE 50 MG PO TABS: 50.0000 mg | ORAL_TABLET | Freq: Two times a day (BID) | ORAL | 1 refills | Status: DC

## 2018-07-30 NOTE — Telephone Encounter (Signed)
Pt called to speak with her more about her Leah Osborne and to inform pt that we were still working on getting her Leah Osborne covered by her insurance. Also went over test results with pt and ordered the medication Aldactone 50mg  for the pt sending [facility] to [facility] [email or web address].

## 2018-07-30 NOTE — Unmapped (Signed)
[facility]   Psychiat[facility]   Outpatient Bariatric Surgery Evaluation       REFERRAL AND BACKGROUND INFORMATION: Biannca Scantlin Koslosky was referred for a psychological re-evaluation to assess her candidacy for bariatric surgery.     HISTORY OF PRESENT ILLNESS: ARLEAN THIES is a 33 y.o., married, Caucasian female who first became overweight at age 56.  She recalls her lowest weight as an adult at 265 lbs. at age 27.  Patient???s weight steadily increased (with some fluctuation in diet) until reaching her highest weight of 324.4 lbs. Patient's current height is 60 inches.   She stated ideal weight is 240 lbs. Patient attributes weight gain to genetics, poor food choices, large portions, meal/snack patterns, emotional eating and lack of exercise.  She has been diagnosed with pre-diabetes, sleep apnea, chronic pain and SOB. With regard to diet history, patient has tried Slimfast, low carb diets, portion control, gracina cambogia, and medication for weight loss with no success. She states that she has never really tried to diet other than that.  She is following a specific diet plan at present; the one recommended by the team here.     EATING DISORDER SYMPTOMS: Patient denies history of binge eating, purging, or other compensatory behaviors for the purposes of weight control. Patient denies history of grazing or night eating, but eats emotionally once per month usually before she starts her menses.       CURRENT PSYCHOLOGICAL FUNCTIONING: Patient denies any current struggles with low mood, anhedonia, tearfulness, decrease in energy, feelings of worthlessness, or changes in her concentration/memory in the past month. She denies current suicidal ideation or self-injury. She obtains approximately 7-8 hours of sleep per night using a CPAP. Patient endorses struggling with sleep onset problems which she attributes to some of her medications, but denies sleep maintenance insomnia. She denies any current struggles with anxiety symptoms or worry.  She had a panic attack last week while in heavy traffic.    PSYCHIATRIC HISTORY: Family psychiatric history is remarkable for  alcohol and substance abuse/dependence in both her maternal and paternal bloodlines, including her own parents. She has a sister who abuses drugs and has had substance induced psychosis. Her mother is a recovering alcoholic and has also has depression and anxiety.  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  She also has a history of panic attacks triggered in crowds. Her mood symptoms are being treated with lamicatal prescribed by her psychiatrist.  She sees a therapist every other week since the beginning of this year and has a LOS from her.  She denies a history of dysthymia, generalized anxiety, or OCD. Patient denies history of suicidal attempts. Patient denies  history of alcohol abuse, tobacco use, or prescription or street drug use. She is currently abstinent from alcohol. She denies any history of physical, sexual or verbal/emotional abuse.     SOCIAL AND OCCUPATIONAL FUNCTIONING/HISTORY: Patient is married since May 2017 to her second husband. She was married for 3 years at age 60, but divorced at age 74.  Patient has 2 biological children; a 81 y/o son and a 32  y/o daughter.  She and her husband also live with his 54 y/o son.  She completed some college.   She is employed fullt-time at a [employer] for the past 1.5 years.  Patient violated a protective order last September but the charge was dismissed.   Patient denies a history of trauma.     SOCIAL SUPPORT: Patient reports primarily relying on her husband. Mother and grandmother for social support. She indicates that her support system is supportive of her decision to pursue bariatric surgery, and her husband and mother will be available to help provide the necessary post-surgical support.     STRESS AND STRESS MANAGEMENT: Patient denies any significant sources of stress in her daily life. Coping resources include distraction and talking with members of her social support system.  She has experienced 5 deaths in the past 5 weeks, but states that she is managing her grief effectively.    SURGERY APPRAISAL AND READINESS: Patient reports having learned about bariatric surgery primarily through discussions with her PCP. 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  At present, she is interested in the sleeve gastrectomy and she is able to voice a general understanding of both procedures, potential complications, and the required diet after surgery. Patient reported that her main reasons for pursuing surgery are to feel better, improve pain, improve weight-related health problems, live longer and look better.    Mental Status Exam:  Engagement:   Engaged and cooperative   Appearance:    Appears stated age, Well nourished, Well developed and Clean/Neat   Speech/Language:    Normal rate, volume, tone, fluency and Language intact, well formed   Mood:   Good   Affect:   Calm, Cooperative and Euthymic   Thought process:   Logical, linear, clear, coherent, goal directed   Thought content:     Denies SI, HI, self harm, delusions, obsessions, paranoid ideation, or ideas of reference   Orientation:   Oriented to person, place, time, and general circumstances   Attention:   Able to fully attend without fluctuations in consciousness   Memory:   Immediate, short-term, long-term, and recall grossly intact   Insight/Judgment:   Good     DIAGNOSTIC SUMMARY AND PLAN:   Diagnoses: Major Depression, Single Episode; Panic Disorder; Psychological Factors that Affect Obesity'; BPAD  Stressors: Recent deaths  Disability: Deferred    Recommendations:     1) There are not any major psychiatric symptoms that preclude Shanele T Geyer from pursuing weight loss surgery.  She appears to be a good candidate. She has done research about the procedure, and seems to have a realistic appraisal of the surgical process and realistic expectations of outcome. She is motivated for the surgery, has adequate social support, and is functioning well from a mental health perspective.     2) Patient and I discussed her risk for a recurrence of depression and anxiety symptoms given her psychiatric history. She was provided with psychoeducation regarding signs and symptoms to watch for and was encouraged to be in touch with myself, her PCP and/or the bariatric team should any changes in mood or behavior occur. She has provided [place] a LOS from her current therapist.    3) Patient was encouraged to utilize the follow-up groups that our group offers as a way to augment her social support before and after surgery, as well as to join the [employer].     Bolivar Haw, PhD  Licensed Psychologist  07/30/2018

## 2018-08-01 ENCOUNTER — Ambulatory Visit: Admit: 2018-08-01 | Discharge: 2018-08-02 | Payer: MEDICAID | Attending: Registered" | Primary: Registered"

## 2018-08-01 DIAGNOSIS — Z6841 Body Mass Index (BMI) 40.0 and over, adult: Secondary | ICD-10-CM

## 2018-08-01 DIAGNOSIS — E669 Obesity, unspecified: Secondary | ICD-10-CM | POA: Diagnosis not present

## 2018-08-01 DIAGNOSIS — M1991 Primary osteoarthritis, unspecified site: Secondary | ICD-10-CM | POA: Diagnosis not present

## 2018-08-01 DIAGNOSIS — E079 Disorder of thyroid, unspecified: Secondary | ICD-10-CM | POA: Diagnosis not present

## 2018-08-01 NOTE — Unmapped (Signed)
[facility]-Up    Jacqueline Hall is a 33 y.o. female seen for medical nutrition therapy.    Referring Provider or Clinic:  [facility]    Primary Care Provider:  Smitty Cords, DO    Reason for Referral:  Patient follow-up after the nutrition class for goal assessment/potential approval for bariatric surgery.      Diagnosis/Impression:  The patient is currently likely meeting estimated nutrition needs related to overall good compliance with previous nutrition goals.  Per anthropometric data, BMI: 59.44 classifies patient as obese (class 3).  EMR indicates a 2.7kg weight loss since last RD visits.  Labs 01/03 indicate low Vit D and iron saturation.  HbA1c: 5.8 indicative of prediabetes. She is compliant with previous nutrition recommendations. She is eating 3 meals and 2 snacks per day. Food choices are generally high in protein.  Patient may snack on high carbohydrate/low nutrient items. Beverages are always sugar free and noncarbonated. She is practicing not drinking with meals, however, this remains a challenge. She is participating in a regular exercise regimen. She has selected an appropriate bariatric vitamin supplement.  Patient expressed a verbal understanding of the pre op diet, post op diet progression, protein/lfuid goals, and exercise/vitamin recommendations.     Malnutrition Assessment using AND/ASPEN Clinical Characteristics:  Patient does not meet AND/ASPEN criteria for malnutrition at this time (08/01/18 1344)                    Monitoring/Evaluation:  Progress towards goals:  lost weight    Nutrition Goals:  Sustainable weight change of  (0.52-1.0)#/wk weight loss prior to next RD visit  Meet nutritional needs   Reduce long-term health risk  Compliance with nutrition    Estimated Needs:  Daily Estimated Nutrient Needs:  Energy: 2085 kcals Per [place]-Jeor Equation(AF: 1.0 for weight loss) using last recorded weight, 142.6 kg (08/01/18 1343)] Protein: 72-87 gm [1.0-1.2 gm/kg using adjusted body weight(for a BMI: 25), 72.3 kg (08/01/18 1343)]  Carbohydrate:   [< / equal to 45% of kcal]  Fluid: 2000-3000 mL [ ]     Patient Education:  RD reinforced the following topics in preparation for bariatric surgery: meal/snack consistency, focusing on protein at meals/snacks, limiting high carbohydrate convenience foods, practicing mindful eating, and regular exercise.  Patient was given a brief review of the pre op diet, post op diet progression, protein/fluid goals, and exercise/vitamin recommendations.  Patient was amenable to suggestions and was given a list of patient goals for reference/reinforcement.       Nutrition Intervention/Recommendations:  1.  Maintain consistent meal schedule  2.  Continue to focus on protein intake  3.  Limit high carbohydrate convenience foods  4.  EXERCISE: increase intensity/frequency/duration as tolerated  5.  Continue to review bariatric nutrition packet prior to surgery    Materials Provided:  Tips and recipes  Verbal list of patient goals    Expected Compliance:  Comprehension of plan is good.  Readiness for change is good.  Ability to meet goals is good.    Patient has had many previous unsuccessful attempts at weight loss.  Patient has expressed a verbal understanding of the post bariatric surgery diet and the recommendations including: meal/snack timing, protein intake, not eating and drinking at meals and waiting for 30 minutes after meals to start drinking, vitamin supplementation, and regular exercise.  Patient is aware that failure to follow the post operative plan may have negative consequences such as failure to lose  weight/weight gain and vitamin deficiencies.  Patient was amenable to suggestions and has expressed a willingness/ability to follow recommendations in order to have a positive outcome from bariatric surgery.  At this time, RD believes patient is a good candidate for surgery.    Follow up:  6 weeks post op Length of visit was 30 minutes.  ----------------------------------------------------------------------------------------------------------------------  Nutrition Plan - patient previously seen by weight management    New developments since last nutrition appointment:  Patient has been working on the following behaviors:    1.  Meal schedule - meals: 3, snacks: 2  2.  Patient is practicing not drinking with meals and waiting for 30 minutes after  3.  Patient endorsed success with protein intake  4.  Patient endorsed success with mindful eating  5.  Patient has eliminated sugar sweetened and carbonated beverages  6.  Protein shake: Premier, Vitamin: patient plans to use the Celebrate MVI  7.  EXERCISE: walking everyday for 30 minutes on lunch break    Past Medical History:  Past Medical History:   Diagnosis Date   ??? Abnormal Pap smear of cervix 2013    per patient; biopsy was done and then everything was fine   ??? Anemia 2013   ??? Ankylosing spondylitis (CMS-HCC)    ??? Arthritis 2018   ??? Bipolar 1 disorder (CMS-HCC) 2018   ??? Depression    ??? Disease of thyroid gland    ??? Elevated blood-pressure reading without diagnosis of hypertension    ??? Headache(784.0)     more than 2-3 times a week; takes Ibuprofen. could not afford prescription medication   ??? History of pre-eclampsia in prior pregnancy, currently pregnant 11/2011   ??? Hypertension    ??? Menorrhagia    ??? Morbid obesity with BMI of 50.0-59.9, adult (CMS-HCC)    ??? Obesity    ??? PTSD (post-traumatic stress disorder) 2018   ??? Sleep apnea      Past Surgical History:   Procedure Laterality Date   ??? CHOLECYSTECTOMY  12/20/2007    Initially done L/S then had some sort of complication, required endoscopy   ??? PR UPPER GI ENDOSCOPY,DIAGNOSIS N/A 07/17/2016    Procedure: UGI ENDO, INCLUDE ESOPHAGUS, STOMACH, & DUODENUM &/OR JEJUNUM; DX W/WO COLLECTION SPECIMN, BY BRUSH OR WASH;  Surgeon: Annie Paras, MD;  [facility]: [facility];  Service: Gastroenterology ??? TUBAL LIGATION         Anthropometrics:  Initial Weight: 145.7kg (321#)  Current Weight: (!) 142.6 kg (314 lb 6.4 oz)  Current Height: 154.9 cm (5' 0.98)  Current BMI: (!) 59.44  IBW: Ideal body weight: 47.8 kg (105 lb 4.8 oz)  ABW: Weight in (lb) to have BMI = 25: 132  Goal Weight (per patient): under 200#  Weight Change: -2.7kg since last RD visit    Weight History:  Wt Readings from Last 12 Encounters:   08/01/18 (!) 142.6 kg (314 lb 6.4 oz)   06/20/18 (!) 147.3 kg (324 lb 12.8 oz)   06/12/18 (!) 147.1 kg (324 lb 6.4 oz)   05/12/18 (!) 145.3 kg (320 lb 6.4 oz)   04/29/18 (!) 146 kg (321 lb 12.8 oz)   04/11/18 (!) 145.7 kg (321 lb 3.2 oz)   03/07/18 (!) 144.7 kg (319 lb 0.1 oz)   12/25/17 (!) 139.2 kg (306 lb 12.8 oz)   12/11/16 (!) 125.5 kg (276 lb 11.2 oz)   12/06/16 (!) 127.3 kg (280 lb 11.2 oz)   10/05/16 (!) 121.4  kg (267 lb 11.2 oz)   09/07/16 (!) 121.6 kg (268 lb 1.3 oz)       Nutrition Focused Physical Exam:                        Nutrition Evaluation  Overall Impressions: Nutrition-Focused Physical Exam not indicated due to lack of malnutrition risk factors. (08/01/18 1343)  Nutrition Designation: Obese class III  (BMI > 39.99 kg/m2) (08/01/18 1343)    Relevant Labs:  Lab Results   Component Value Date    HGB 13.7 06/20/2018    HCT 41.7 06/20/2018    MCV 83.4 06/20/2018    IRON 45 06/20/2018    TIBC 316.9 06/20/2018    TRANSFERRIN 251.5 06/20/2018    LABIRON 14 (L) 06/20/2018    FERRITIN 17.9 09/05/2016     Lab Results   Component Value Date    VITDTOTAL 18.2 (L) 06/20/2018    VITAMINB12 399 06/20/2018     Lab Results   Component Value Date    A1C 5.8 (H) 06/20/2018    EAG 120 06/20/2018       Relevant Medications/Supplements:  Reviewed nutritionally relevant medications and supplements.  Meds: omeprazole (prn), levothyroxine, topiramate  Vitamins: Vit D, calcium     Physical Activity:  Walking     Other GI Issues:  IBS with diarrhea    Food Intolerances/Dietary Restrictions:  NKFA/no restrictions Allergies:  No Known Allergies    24-Hour Recall:  Up: 6:30am  B 7:45am: protein shake (Premier)  S   L 12:00pm: grilled chicken/ground beef/pork + steamed vegetables + brown rice (limits)  S baked chips or pretzels or yogurt  D 7:00pm: pork chops/hot dogs/chicken/burgers + veggies  HS     Beverages  Water, diet green tea  Eating Out  2-3x/wk - grilled chicken salads  Cooking  a lot    Behavioral Risk Factors:  Excessive Hunger  denies  Emotional Eating  denies  Meal Skipping  denies  Grazing  denies  Night Eating  denies

## 2018-08-04 NOTE — Unmapped (Deleted)
[facility] [employer] [facility]  OUTPATIENT PHYSICAL THERAPY     08/06/2018    Patient Name: Jacqueline Hall  Date of Birth:1985-11-14  Session:    Diagnosis:   No diagnosis found.                          ASSESSMENT:          Goals:      Short Term Goals (6 weeks)  ?? Hip ER strength 5/5 for improved femoroacetabular stability and decreased strain on obturator internus during weight bearing activities.  ?? Trendelenburg with single limb stance eliminated for improved LE alignment with ambulation and ascending/descending stairs for continued pelvic floor muscle stability.  ??  Long Term Goals (12 weeks)  ?? Patient able to sit on hard surface for 60 minutes with </= 6/10 pain for improved ability to return to PLOF and work without pain.  ?? Patient able to walk for 45 minutes with </= 6/10 pain for improved ability to resume PLOF and maintain activity for improved weight loss.  ?? Pt independent with HEP for self-management of sxs.  ??    PLAN:      for           SUBJECTIVE:                                 OBJECTIVE:  ***  Good ability to progress exercises with focus on hip ABD, ER, and EXT for improved SIJ stability and force closure. Would continue to benefit from further strengthening and lumbopelvic stabilization as able.   TRX activities  theraband exercises in standing (squats, side stepping, etc)    ____________________________________________________  Therapeutic Exercise x 40' total:  Pelvic Alignment:  PSIS: level  Sacral Sulcus: L SS stuck forward w/ minimal mobility appreciated    ?? Hooklying resisted hip flexion w/ level I TB 3 x 10 reps  ?? Hooklying hip ER/ABD w/ level I TB 10 reps ea unilaterally + 10 reps bilaterally x 3 sets total. Patient demonstrates appropriately and expresses verbal understanding therefore added to HEP, h/o given  ?? hooklying piriformis stretch x 60 hold. Patient demonstrates appropriately and expresses verbal understanding therefore added to HEP, h/o given  ?? 4-way hip w/o resistance x 10 reps ea w/ visible R hip drop w/ prolonged SLS    ??  Special Tests/Clearing Screen:  ?? Hip & SIJ Screen:   ? R:  (-) Scour, P4, FABER, FADIR  ? L:  (+) Scour & P4 w/ pain to L low back  ?? MSI Screen: (+) L ant fem glide  ??  External Muscle/Ligament Palpation:    TTP B QL, B piriformis, L Glut Max  ??  Strength:  L hip flexion 2/5 MMT  R hip flexion 3+/5 MMT  ??  B hip ER = 5/5 MMT  B hip IR = 5/5 MMT  ??    H/o: ***     HEP:  OI retraining w/ leg bent  Bridge w/ gait belt around distal thigh 2 x 10 reps  Hooklying hip ER/ABD w/ level I TB 30 reps ea unilaterally + 30 reps bilaterally  piriformis stretch                  Treatment Rendered:    Total Treatment Time = *** min (***)     I attest that I  have reviewed the above information.  SignedLajean Silvius PT, DPT  08/04/18 1:18 PM

## 2018-08-18 DIAGNOSIS — F3181 Bipolar II disorder: Secondary | ICD-10-CM | POA: Diagnosis not present

## 2018-08-20 ENCOUNTER — Ambulatory Visit
Admit: 2018-08-20 | Discharge: 2018-08-21 | Payer: MEDICAID | Attending: Rehabilitative and Restorative Service Providers" | Primary: Rehabilitative and Restorative Service Providers"

## 2018-08-20 DIAGNOSIS — N920 Excessive and frequent menstruation with regular cycle: Principal | ICD-10-CM

## 2018-08-20 DIAGNOSIS — F4310 Post-traumatic stress disorder, unspecified: Principal | ICD-10-CM

## 2018-08-20 DIAGNOSIS — O09299 Supervision of pregnancy with other poor reproductive or obstetric history, unspecified trimester: Principal | ICD-10-CM

## 2018-08-20 DIAGNOSIS — M459 Ankylosing spondylitis of unspecified sites in spine: Principal | ICD-10-CM

## 2018-08-20 DIAGNOSIS — Z6841 Body Mass Index (BMI) 40.0 and over, adult: Principal | ICD-10-CM

## 2018-08-20 DIAGNOSIS — M461 Sacroiliitis, not elsewhere classified: Principal | ICD-10-CM

## 2018-08-20 DIAGNOSIS — G4730 Sleep apnea, unspecified: Principal | ICD-10-CM

## 2018-08-20 DIAGNOSIS — F319 Bipolar disorder, unspecified: Principal | ICD-10-CM

## 2018-08-20 DIAGNOSIS — E079 Disorder of thyroid, unspecified: Principal | ICD-10-CM

## 2018-08-20 DIAGNOSIS — R87619 Unspecified abnormal cytological findings in specimens from cervix uteri: Principal | ICD-10-CM

## 2018-08-20 DIAGNOSIS — E669 Obesity, unspecified: Principal | ICD-10-CM

## 2018-08-20 DIAGNOSIS — F329 Major depressive disorder, single episode, unspecified: Principal | ICD-10-CM

## 2018-08-20 DIAGNOSIS — R531 Weakness: Principal | ICD-10-CM

## 2018-08-20 DIAGNOSIS — R030 Elevated blood-pressure reading, without diagnosis of hypertension: Principal | ICD-10-CM

## 2018-08-20 DIAGNOSIS — R51 Headache: Principal | ICD-10-CM

## 2018-08-20 DIAGNOSIS — D649 Anemia, unspecified: Principal | ICD-10-CM

## 2018-08-20 DIAGNOSIS — I10 Essential (primary) hypertension: Principal | ICD-10-CM

## 2018-08-20 DIAGNOSIS — M1990 Unspecified osteoarthritis, unspecified site: Principal | ICD-10-CM

## 2018-08-20 NOTE — Unmapped (Signed)
[facility] THERAP[facility] SERVICES [facility] [place]  OUTPATIENT PHYSICAL THERAPY  Progress Note  08/20/2018    Patient Name: Jacqueline Hall  Date of Birth:Jan 10, 1986  Session: 4  Diagnosis:   Encounter Diagnoses   Name Primary?   ??? Sacroiliitis, not elsewhere classified (CMS-HCC) Yes   ??? Weakness        Onset of Symptoms: 09/08/15  Date of Evaluation: 04/23/18             Problem List: Decreased strength, Decreased range of motion, Pain, Impaired tone, Core weakness, Urinary incontinence    ASSESSMENT:    Assessment: Overall, patient has been doing well since her last visit on 06/12/18 with only one episode of increased low back pain about 1-2 weeks ago lasting for 3 days. Today, patient reported no pain. Increase in R LBP symptoms with repeated extension indicating nerve root impingement, however this responded well to repeated flexion within session. Reviewd exercises and update HEP to progress lumbopelvic strengthening exercises and increase pelvic floor activation with recent return of SUI. Will re-assess in 1 month.      Goals:      Short Term Goals (6 weeks)  ?? Hip ER strength 5/5 for improved femoroacetabular stability and decreased strain on obturator internus during weight bearing activities. -Goal Not Assessed  ?? Trendelenburg with single limb stance eliminated for improved LE alignment with ambulation and ascending/descending stairs for continued pelvic floor muscle stability. -Goal Not Met  ??  Long Term Goals (12 weeks)  ?? Patient able to sit on hard surface for 60 minutes with </= 6/10 pain for improved ability to return to PLOF and work without pain. -Goal Met  ?? Patient able to walk for 45 minutes with </= 6/10 pain for improved ability to resume PLOF and maintain activity for improved weight loss. -Goal in Progress  ?? Pt independent with HEP for self-management of sxs. -Goal Met  ??    PLAN:  1x per month   for Duration: 3 months         SUBJECTIVE:  Subjective : Had a SIJ flare last 1-2 weeks that was particularly bad. Nothing specific caused it and it lasted about 3-4 days and nothing improved the pain. Had a constant ache with intermittent stabbing. The pain was bilateral starting around the low back / sacrum and wrappng around to the hips. However, otherwise hadn't had much pain or issues between the last visit and this one episode. Was doing well with the exercises up until she had the pain episode. With the flare has had worsening of UI (which was getting better previously with the exercises), has also had increased IBS-D symptoms and loose stools. Denies any red flag symptoms (saddle numbness or sensation changes).   Precautions: None       Pain: No/denies                   OBJECTIVE:    Therapeutic Exercise x 45' total:  Pelvic Alignment:  IC: level w/ TTP over L QL  PSIS: unable to palpate well 2/2/ body habitus: however appropriate bilateral mobility w/ forward fold  Sacral Sulcus: unable to palpate appropriately 2/2 body habitus: no pain w/ SBing or ext  (-) B Gillet's Test    Repeated flexion x 10 reps: no pain or discomfort  Repeated extension x 10 reps: R low back / SIJ pain  Repeated flexion x 10 reps: improved R LBP      ?? Unilateral OI retraining x 30  reps ea  ?? Roll for control w/ level I TB + resisted ADD/IR x 15 reps  ?? Roll for control w/ level II TB + resisted ADD/IR x 15 reps. Patient demonstrates appropriately and expresses verbal understanding therefore added to HEP, h/o given  ?? Bridge (glut) 2 x 15 reps with 1 pause at top: improved mm activation and decreased endurance with pause appreciated. Patient demonstrates appropriately and expresses verbal understanding therefore added to HEP, h/o given    ??  Special Tests/Clearing Screen:  ?? Hip & SIJ Screen:   ? R:  (-) Scour, P4, FABER, FADIR  ? L:  (+) Scour & P4 w/ pain to L low back  ?? MSI Screen: (+) L ant fem glide  ??  External Muscle/Ligament Palpation:    TTP B QL, B piriformis, L Glut Max  ??  Strength:  L hip flexion 2/5 MMT  R hip flexion 3+/5 MMT  ??  B hip ER = 5/5 MMT  B hip IR = 5/5 MMT  ??    H/o: OI retraining x 30 reps, Bridge 3 x 10 reps w/ 1 pause at top, Roll for control w/ level II TB x 30 reps     HEP:  OI retraining x 30 reps  Bridge 3 x 10 reps w/ 1 pause at top  Roll for control w/ level II TB x 30 reps   piriformis stretch    Education Provided: Role of therapy in Rehabiliation, importance of therapy, HEP, body mechanics, body awareness, treatment options and plan, symptom management Education results: Verbalized understanding, demonstrated understanding    Communication/Consultation: n/a      Treatment Rendered:    Total Treatment Time = 45 min (3TE x 45 min)     I attest that I have reviewed the above information.  Signed: Lajean Silvius PT, DPT  08/20/18 2:45 PM

## 2018-08-22 ENCOUNTER — Ambulatory Visit: Admit: 2018-08-22 | Discharge: 2018-08-23 | Payer: MEDICAID | Attending: Family | Primary: Family

## 2018-08-22 DIAGNOSIS — G4733 Obstructive sleep apnea (adult) (pediatric): Principal | ICD-10-CM

## 2018-08-22 DIAGNOSIS — Z6841 Body Mass Index (BMI) 40.0 and over, adult: Principal | ICD-10-CM

## 2018-08-22 DIAGNOSIS — R7303 Prediabetes: Principal | ICD-10-CM

## 2018-08-22 DIAGNOSIS — Z7189 Other specified counseling: Principal | ICD-10-CM

## 2018-08-22 DIAGNOSIS — Z713 Dietary counseling and surveillance: Principal | ICD-10-CM

## 2018-08-22 MED ORDER — NASCOBAL 500 MCG/SPRAY NASAL SPRAY
11 refills | 0 days | Status: CP
Start: 2018-08-22 — End: ?

## 2018-08-22 NOTE — Unmapped (Addendum)
PCP: ALEXANDER J KARAMALEGOS, DO    CHIEF COMPLAINT:  Jacqueline Hall presents in follow up for medically monitored diet and exercise assistance visit # 6, month #5.  Initial weight: 321-324 lbs, she started the bariatric surgery process on 04/11/2018. Dr Judie Petit    HISTORY OF PRESENT ILLNESS:  She is here for medical weight loss assistance to help treat obesity. Co-morbidities include: migraine disorder, sacroiliitis,??iron??deficiency anemia, vitamin D deficiency.  Weight change: -10 lbs.  Wt Readings from Last 6 Encounters:   08/26/18 (!) 141.8 kg (312 lb 11.2 oz)   08/01/18 (!) 142.6 kg (314 lb 6.4 oz)   06/20/18 (!) 147.3 kg (324 lb 12.8 oz)   06/12/18 (!) 147.1 kg (324 lb 6.4 oz)   05/12/18 (!) 145.3 kg (320 lb 6.4 oz)   04/29/18 (!) 146 kg (321 lb 12.8 oz)       Health issues:  New dx endometriosis and PCOS.   Hx of: migraine disorder, sacroiliitis,??iron??deficiency anemia, vitamin D deficiency    EATING HABITS:  She describes her eating habits as: good.  She is eating 3 meals, and 1-2 snacks, feels meals are well balanced. She is planning meals.  Harper is focusing on a protein rich diet: protein sources come from: nuts, chicken, whey shakes, dairy.  Aims for 60 g/day.   Vegetable intake is good. Carbohydrate intake: has lowered.   She is practicing eating habit changes (not drinking with meals, avoiding carbonation, practicing mindfulness- such as listening to body's cues- feelings of fullness, satiety, eating slower).  Beverages: water, no juices or sodas, aims for 64 oz of low calorie fluids a day.   Eating out: 2 x a week for lunch  Vitamins: tried Celebrate- MVI and tolerated well.   Topamax: daily  Alcohol: denies. Smoking: denies.    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 .    Exercise tolerance: good.    SLEEP:  Sleep: good, 7-8 hours per night. Sleep is restful, Denies morning headaches. Denies daytime drowsiness.  Has a Hx Sleep apnea   Cpap Use: 3 nights a week, trouble keeping it on at noght, she accidentally pulls it off in the night.    GI SYMPTOMS:  She is not experiencing gerd, dysphagia, abdominal pain.   Bowel habits: regular.   Nausea- chronic, intermittent   PPI use: omeprazole 40.  EGD: done on 07/17/16   Normal esophagus.                     - Z-line irregular, 34 cm from the incisors.                     - Normal stomach.                     - Normal examined duodenum.                     - No specimens collected    PSYCHOSOCIAL:  Mood: good, denies feeling anxious and depressed  Meds: same, no changes, Lamictal .   Therapy: routinely   Support: husband, mom  Psych eval: done w [facility] on 05/02/16 and again in February 2020.     Surgical Understanding:  Surgery: sleeve  Requirements: 6 month supervised diet  Benefits: reviewed permanency of procedure   Risks: reviewed    RELEVANT TESTS/LAB RESULTS:  Lab Results  Component Value Date    WBC 12.3 (H) 06/20/2018    HGB 13.7 06/20/2018    HCT 41.7 06/20/2018    PLT 483 (H) 06/20/2018    CHOL 160 05/02/2016    TRIG 121 05/02/2016    HDL 31 (L) 05/02/2016    ALT 22 06/20/2018    AST 25 06/20/2018    NA 138 06/20/2018    K 4.7 06/20/2018    CL 105 06/20/2018    CREATININE 0.77 06/20/2018    BUN 21 06/20/2018    CO2 24.0 06/20/2018    TSH 2.529 06/20/2018    GLUF 111 (H) 12/06/2014     Lab Results   Component Value Date    VITAMINB12 399 06/20/2018       PAST MEDICAL HISTORY:  Past Medical History:   Diagnosis Date   ??? Abnormal Pap smear of cervix 2013    per patient; biopsy was done and then everything was fine   ??? Anemia 2013   ??? Ankylosing spondylitis (CMS-HCC)    ??? Arthritis 2018   ??? Bipolar 1 disorder (CMS-HCC) 2018   ??? Depression    ??? Disease of thyroid gland    ??? Elevated blood-pressure reading without diagnosis of hypertension    ??? Headache(784.0)     more than 2-3 times a week; takes Ibuprofen. could not afford prescription medication   ??? History of pre-eclampsia in prior pregnancy, currently pregnant 11/2011   ??? Hypertension    ??? Menorrhagia    ??? Morbid obesity with BMI of 50.0-59.9, adult (CMS-HCC)    ??? Obesity    ??? PTSD (post-traumatic stress disorder) 2018   ??? Sleep apnea        PAST SURGICAL HISTORY:  Past Surgical History:   Procedure Laterality Date   ??? CHOLECYSTECTOMY  12/20/2007    Initially done L/S then had some sort of complication, required endoscopy   ??? PR UPPER GI ENDOSCOPY,DIAGNOSIS N/A 07/17/2016    Procedure: UGI ENDO, INCLUDE ESOPHAGUS, STOMACH, & DUODENUM &/OR JEJUNUM; DX W/WO COLLECTION SPECIMN, BY BRUSH OR WASH;  Surgeon: Annie Paras, MD;  [facility];  Service: Gastroenterology   ??? TUBAL LIGATION         SOCIAL HISTORY:  Social History     Social History Narrative    Married 2 children, 1 stepchild.    Previously worked at [employer] in [employer], currently (09/05/2016) unemployed.    Lives in [place].    Has tubal ligation.      Social History     Tobacco Use   Smoking Status Never Smoker   Smokeless Tobacco Never Used       FAMILY HISTORY:  Family History   Problem Relation Age of Onset   ??? Diabetes Maternal Grandmother    ??? Hypertension Maternal Grandmother    ??? Heart disease Maternal Grandmother    ??? Dementia Maternal Grandmother         great grandmother   ??? Heart attack Maternal Grandmother    ??? Osteoporosis Maternal Grandmother         grandfathers mother   ??? Stroke Maternal Grandmother    ??? No Known Problems Mother    ??? Hypertension Father    ??? Hypertension Sister    ??? Hypertension Brother    ??? Heart disease Maternal Grandfather    ??? Atrial fibrillation Paternal Grandmother    ??? Heart failure Paternal Grandmother    ??? Hypothyroidism Maternal Aunt    ???  Migraines Maternal Aunt    ??? Multiple sclerosis Maternal Uncle    ??? Breast cancer Neg Hx    ??? Ovarian cancer Neg Hx        MEDICATIONS:  Outpatient Encounter Medications as of 08/22/2018   Medication Sig Dispense Refill   ??? spironolactone (ALDACTONE) 50 MG tablet Take 50 mg by mouth Two (2) times a day.     ??? ADALIMUMAB PEN CITRATE FREE 40 MG/0.4 ML Inject 0.4 mL (40 mg total) under the skin every fourteen (14) days. 6 each 1   ??? celecoxib (CELEBREX) 200 MG capsule Take 1 capsule (200 mg total) by mouth Two (2) times a day. 180 capsule 3   ??? cyanocobalamin, vitamin B-12, (NASCOBAL) 500 mcg/spray Spry 1 spray into 1 nare once a week 4 Bottle 11   ??? elagolix (ORILISSA) 150 mg Tab Take 150 mg by mouth daily.     ??? lamoTRIgine 25 mg (35) DsPk 25 mg daily.      ??? levothyroxine (SYNTHROID, LEVOTHROID) 25 MCG tablet Take 1.5 tablets (37.5 mcg total) by mouth daily. 90 tablet 3   ??? MEDICAL SUPPLY ITEM Please provide patient with 2 left carpal tunnel syndrome braces for wrists. 2 Units 0   ??? omeprazole (PRILOSEC) 40 MG capsule Take 40 mg by mouth.     ??? propranolol (INDERAL) 20 MG tablet 20 mg Two (2) times a day.      ??? topiramate (TOPAMAX) 50 MG tablet Take 1 tablet (50 mg total) by mouth every evening. 90 tablet 3     No facility-administered encounter medications on file as of 08/22/2018.        ALLERGIES:  Patient has no known allergies.      REVIEW OF SYMPTOMS:  A ten-point ROS was performed and is negative except for the pertinent findings in the HPI.    PHYSICAL EXAM:  BP 138/71 (BP Site: L Arm, BP Position: Sitting, BP Cuff Size: Large)  - Pulse 61  - Ht 154.9 cm (5' 0.98)  - Wt (!) 141.8 kg (312 lb 11.2 oz)  - SpO2 98%  - BMI 59.12 kg/m??   Body Measurements in inches: Waist 59 inches. Hips: 65 inches  Body Fat Percentage: 50%  Alert, pleasant  In no acute distress  Resp Reg and Easy  Ambulates well without assistance    ASSESSMENT:  1. Class 3 Obesity  Encounter Diagnoses   Name Primary?   ??? BMI 50.0-59.9, adult (CMS-HCC) Yes   ??? Obstructive sleep apnea    ??? Encounter for weight loss counseling    ??? Encounter for pre-bariatric surgery counseling and education    ??? Prediabetes      Mifflin-St Jeor BMR (calories per day):  2100    Plan:  Alisyn is still an acceptable candidate for weight loss surgery and is deemed medically necessary based on : her BMI, she will need to have some preoperative weight loss and another month of a supervised diet to meet insurance requirements.   She has initiated a medically monitored diet and exercise program, as of 05/02/18 at [facility], and 04/11/18 with Obesity Medicine. She continues to express interest in weight loss surgery.   Her current understanding of the surgery, surgical process is very good.     We reviewed her medical history, IBW, eating habits, exercise habits, sleep habits, and reviewed her progress.     Gallbladder  Absent     Genella Rife  No GERD     Diabetes  prediabetes.   Lab Results   Component Value Date    A1C 5.8 (H) 06/20/2018        Fertility status  tubal ligation     Surgery Requested/chosen  Sleeve Gastrectomy.       She will need to increase exercising outside of her daily routine.  This can include walking, chair exercises, cardio, weights, bands, and resistance training as tolerated. Goal 10k steps a day, or 150 minutes a week. Encouraged use of Pedometer.     Encouraged healthy sleep habits/sleep hygiene: such as going to bed around the same time nightly and waking around the same time each morning. Limit screen time before bed x 1 hour minimum.   Wear CPAP nightly. Advised no discontinuing with surgery, unless surgeon or pcp advised.     She will follow the RD recommendations, seen on 08/01/2018, see note for full details.    Preoperative Bariatric Surgery education:  Gastric Sleeve and Gastric Bypass.  Reviewed content in Bariatric Surgery Education Binder.   ?? Benefits and results  ?? Reviewed anatomical changes, realistic weight loss expectations  ?? Eating Habit Changes (Mindfulness, avoidance of liquid calories, eat slowly, chew well, fork down between bites, measure portions, incorporate lean proteins. Limit simple carbohydrates, fast foods, and processed foods)  ?? Postoperative Information:   (length of stay, time off from work, anticipated weight loss, realistic expectations, physical activity requirements, dietary compliance)  ?? Risks, common side effects:    (post operative pain, constipation, nausea, reflux, leaks, stenosis, vitamin deficiency risk, dehydration, dumping syndrome, hair loss, skin laxity, vitamin and nutritional supplements, need for long term follow up care)  ?? Avoidance of nicotine, alcohol, blood thinners or NSAIDS    Preoperative requirements:  Insurance BCBS       Completed Pending Other   Supervised Diet Started 04/11/18     EGD 07/17/16      Nutrition Class 07/25/2018     RD clearance 08/01/2018     Psych evaluation 07/30/2018     Letter of support - psych (if needed)      Labs 1/3, 2/12     Urine tox/nicotine 2/12   non smoker   PSG (polysomnogram) 08/25/16     Support Groups x     Referral x       Recommend:   1. Attend support group, or join online   2. Goals: keep up the good work      She was reminded, and understands, the need to see PCP for routine health care needs outside of Bariatric Surgery/Obesity Management.     Return to Clinic: 1 month    Dalyla Chui L. Mattheu Brodersen MSN, FNP APRN-BC  [employer]- Dept of GI Surgery: Bariatric Surgery   08/22/2018

## 2018-08-22 NOTE — Unmapped (Signed)
Thank you for coming in today.  Your insurance mandates that we do a supervised diet prior to surgery  It is important to follow the recommendations made by the Bariatric Team to obtain approval by your insurance and our program.  5% excess body weight loss is required before surgery. If you are gaining weight additional visits may be necessary.    You have 1 visit to go.    Pre-Operative Dietary Goals  ? Follow a consistent meal pattern. Eat 3 small well balanced meals and 2 snacks.   o Do not skip meals and do not graze.  o Do not go long periods of time (more than 4 hr) without eating.  ? Have protein with every meal. Aim for 60-80 g daily.  ? Eat 2-4 servings of vegetables a day and 2 servings of fruit a day.  ? Drink 64-80 oz of water daily.  ? Avoid all sugary drinks (Ex: juice, soda (even Sprite & Ginger Ale), sweet tea).  ? Avoid carbonated beverages.  ? Practice not drinking with meals, wait 30 minutes after the meal before drinking.  ? Start a chewable multivitamin at least 2 weeks before surgery  ? Exercise ??? Aim for 30 minutes, most days of the week.  ? Keep a food journal.  ? Eat small, use a small utensil, eat slowly and chew well. Eat from a saucer, measure it out. After surgery no > 3-4 oz. at a time  ? Have a friend to call who supports you, or come to [place], or FB page [facility] Support Group  ? Improve sleep    Read about the surgery of your choice, be sure you understand it, ask questions       No smoking, nicotine, drug, or alcohol use is advised

## 2018-08-25 ENCOUNTER — Telehealth: Payer: Self-pay | Admitting: Obstetrics and Gynecology

## 2018-08-25 NOTE — Telephone Encounter (Signed)
Roderic Palau called from [employer] to verify we received the PA request for this patient, please use reference key [id number], and was for Orlissa 150 mg qty 30 tabs sent to [facility], please advise, thanks.

## 2018-08-27 ENCOUNTER — Telehealth: Payer: Self-pay | Admitting: Obstetrics and Gynecology

## 2018-08-27 ENCOUNTER — Telehealth: Payer: Self-pay

## 2018-08-27 MED ORDER — ELAGOLIX SODIUM 150 MG PO TABS: 150.0000 mg | ORAL_TABLET | Freq: Every day | ORAL | 11 refills | Status: DC

## 2018-08-27 NOTE — Telephone Encounter (Signed)
Returned call for [place], asking for her to call her back please advise, thanks.

## 2018-08-27 NOTE — Telephone Encounter (Signed)
Spoke with pt and informed her that [facility] had approved her Freida Busman and 2 samples along with discount was ready for her to pick up. Pt stated that she understood.

## 2018-08-27 NOTE — Telephone Encounter (Signed)
The patient called and stated that she missed a call from [place] and that the matter is urgent. The patient also stated she was a little frustrated about calling several times and not being able to speak with her nurse. I apologized to the patient and informed her that Dr. Marcelline Mates has a full schedule today and a nurse will contact her as soon as possible.

## 2018-08-27 NOTE — Telephone Encounter (Signed)
Pt called no answer LM via voicemail to please call the office to speak more about her medication Orilissa.

## 2018-08-28 NOTE — Telephone Encounter (Signed)
Please see another phone encounter.  

## 2018-08-29 ENCOUNTER — Encounter: Payer: Self-pay | Admitting: Emergency Medicine

## 2018-08-29 ENCOUNTER — Ambulatory Visit
Admission: EM | Admit: 2018-08-29 | Discharge: 2018-08-29 | Disposition: A | Discharge status: Home / Self Care | Payer: BLUE CROSS/BLUE SHIELD | Attending: Family Medicine | Admitting: Family Medicine

## 2018-08-29 ENCOUNTER — Other Ambulatory Visit: Payer: Self-pay

## 2018-08-29 DIAGNOSIS — J029 Acute pharyngitis, unspecified: Secondary | ICD-10-CM

## 2018-08-29 DIAGNOSIS — R05 Cough: Secondary | ICD-10-CM

## 2018-08-29 DIAGNOSIS — J069 Acute upper respiratory infection, unspecified: Secondary | ICD-10-CM | POA: Diagnosis not present

## 2018-08-29 LAB — RAPID STREP SCREEN (MED CTR MEBANE ONLY): Streptococcus, Group A Screen (Direct): NEGATIVE

## 2018-08-29 LAB — RAPID INFLUENZA A&B ANTIGENS
Influenza A (ARMC): NEGATIVE
Influenza B (ARMC): NEGATIVE

## 2018-08-29 MED ORDER — BENZONATATE 200 MG PO CAPS: ORAL_CAPSULE | ORAL | 0 refills | Status: DC

## 2018-08-29 NOTE — ED Triage Notes (Signed)
Patient c/o sore throat for the past 2 days.  Patient reports cough for the past 3 days.  Patient has not checked her temperature.

## 2018-08-29 NOTE — ED Provider Notes (Signed)
[facility]    CSN: [id number] Arrival date & time: 08/29/18  1530     History   Chief Complaint Chief Complaint  Patient presents with  . Sore Throat  . Cough    HPI Leah Osborne is a 33 y.o. female.   HPI  -year-old female presents with a sore throat that she has had for the past 2 days.  She also has a nonproductive cough for the past 3 days.  She has not had chills or fever.  Is concerned because 2 of her coworkers have both been diagnosed with the flu.  Works at a call center for collections .  Today vital signs are completely normal.         Past Medical History:  Diagnosis Date  . Abnormal Pap smear of cervix   . Arthritis   . Arthritis, rheumatoid ([place])   . Bipolar 1 disorder ([place])   . Chest pain   . Headache    MIGRAINES  . Hyperlipidemia   . Hypertension   . Palpitations    a. 09/2017 Zio Monitor: predominantly RSR, avg HR 87, 11 A tach/SVT episodes, fastest 176 x 7 beats, longest 9 beats. Rare PAC's, occas PVC's, rare couplets/triplets.  Marland Kitchen PTSD (post-traumatic stress disorder)   . PVC (premature ventricular contraction)   . Social phobia     Patient Active Problem List   Diagnosis Date Noted  . Elevated hemoglobin A1c 02/12/2018  . Dyslipidemia 02/05/2018  . Abnormal skin growth 11/15/2016  . PTSD (post-traumatic stress disorder) 10/24/2016  . Severe bipolar I disorder, most recent episode depressed without psychotic features ([place]) 10/23/2016  . Ankylosing spondylitis ([place]) 10/23/2016  . Sacroiliac inflammation ([place]) 08/02/2016  . Hypertension 05/28/2016  . Vitamin D deficiency 05/02/2016  . 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 Hypothyroidism 02/13/2016  . Anxiety and depression 01/23/2016  . Status post tubal ligation 09/22/2015  . Morbid (severe) obesity due to excess calories ([place]) 04/25/2015  . Anemia, iron deficiency 01/04/2015  . Headache, migraine 01/04/2015  . Abnormal cytological  findings in female genital organs 07/04/2011    Past Surgical History:  Procedure Laterality Date  . CHOLECYSTECTOMY    . LAPAROSCOPIC TUBAL LIGATION Bilateral 09/19/2015   Procedure: LAPAROSCOPIC BILATERAL TUBAL BANDING;  Surgeon: Brayton Mars, MD;  Location: [facility] ORS;  Service: Gynecology;  Laterality: Bilateral;    OB History    Gravida  2   Para  2   Term  2   Preterm  0   AB  0   Living  2     SAB  0   TAB  0   Ectopic  0   Multiple  0   Live Births  2            Home Medications    Prior to Admission medications   Medication Sig Start Date End Date Taking? Authorizing Provider  Adalimumab (HUMIRA PEN [place]) Inject into the skin.   Yes [provider]  celecoxib (CELEBREX) 200 MG capsule Take 200 mg by mouth 2 (two) times daily.   Yes [provider]  Elagolix Sodium (ORILISSA) 150 MG TABS Take 150 mg by mouth daily. 08/27/18  Yes Rubie Maid, MD  lamoTRIgine (LAMICTAL) 100 MG tablet Take 100 mg by mouth daily.  05/20/18  Yes [provider]  levothyroxine (SYNTHROID, LEVOTHROID) 25 MCG tablet Take 1 tablet (25 mcg total) by mouth daily before breakfast. 05/08/18  Yes Karamalegos,  Devonne Doughty, DO  magnesium oxide (MAG-OX) 400 MG tablet Take 1 tablet (400 mg total) by mouth daily. 07/10/18  Yes Deboraha Sprang, MD  omeprazole (PRILOSEC) 40 MG capsule Take 40 mg by mouth daily.   Yes [provider]  propranolol ER (INDERAL LA) 60 MG 24 hr capsule Take 1 capsule (60 mg total) by mouth daily. 07/10/18  Yes Deboraha Sprang, MD  spironolactone (ALDACTONE) 50 MG tablet Take 1 tablet (50 mg total) by mouth 2 (two) times daily. 07/30/18  Yes Rubie Maid, MD  topiramate (TOPAMAX) 25 MG tablet Take 50 mg by mouth daily. 50MG  ONCE DAILY   Yes [provider]  benzonatate (TESSALON) 200 MG capsule Take one cap TID PRN cough 08/29/18   Lorin Picket, PA-C    Family History Family History  Problem Relation Age of  Onset  . Diabetes Maternal Grandmother   . Heart disease Maternal Grandmother   . Heart failure Maternal Grandmother   . Heart attack Maternal Grandmother   . Rheum arthritis Mother   . Healthy Father   . Heart Problems Maternal Grandfather   . Atrial fibrillation Paternal Grandmother   . Cancer Neg Hx     Social History Social History   Tobacco Use  . Smoking status: Never Smoker  . Smokeless tobacco: Never Used  Substance Use Topics  . Alcohol use: Not Currently    Alcohol/week: 0.0 standard drinks  . Drug use: No     Allergies   Patient has no known allergies.   Review of Systems Review of Systems  Constitutional: Positive for activity change. Negative for appetite change, chills, fatigue and fever.  HENT: Positive for sore throat.   Respiratory: Positive for cough.   All other systems reviewed and are negative.    Physical Exam Triage Vital Signs ED Triage Vitals  Enc Vitals Group     BP 08/29/18 1548 110/90     Pulse Rate 08/29/18 1548 89     Resp 08/29/18 1548 16     Temp 08/29/18 1548 98.4 F (36.9 C)     Temp Source 08/29/18 1548 Oral     SpO2 08/29/18 1548 99 %     Weight 08/29/18 1544 (!) 310 lb (140.6 kg)     Height 08/29/18 1544 5\' 1"  (1.549 m)     Head Circumference --      Peak Flow --      Pain Score 08/29/18 1544 5     Pain Loc --      Pain Edu? --      Excl. in [place]? --    No data found.  Updated Vital Signs BP 110/90 (BP Location: Right Arm)   Pulse 89   Temp 98.4 F (36.9 C) (Oral)   Resp 16   Ht 5\' 1"  (1.549 m)   Wt (!) 310 lb (140.6 kg)   LMP 06/03/2018 (Approximate)   SpO2 99%   BMI 58.57 kg/m   Visual Acuity Right Eye Distance:   Left Eye Distance:   Bilateral Distance:    Right Eye Near:   Left Eye Near:    Bilateral Near:     Physical Exam Vitals signs and nursing note reviewed.  Constitutional:      General: She is not in acute distress.    Appearance: She is well-developed. She is obese. She is not  ill-appearing, toxic-appearing or diaphoretic.  HENT:     Head: Normocephalic and atraumatic.     Right Ear:  Tympanic membrane and ear canal normal.     Left Ear: Tympanic membrane and ear canal normal.     Nose: No congestion or rhinorrhea.     Mouth/Throat:     Mouth: Mucous membranes are moist. No oral lesions.     Pharynx: Oropharynx is clear. No pharyngeal swelling, oropharyngeal exudate, posterior oropharyngeal erythema or uvula swelling.     Tonsils: No tonsillar exudate or tonsillar abscesses. Swelling: 1+ on the right. 1+ on the left.  Eyes:     Conjunctiva/sclera: Conjunctivae normal.  Neck:     Musculoskeletal: Normal range of motion and neck supple.  Pulmonary:     Effort: Pulmonary effort is normal.     Breath sounds: Normal breath sounds.  Skin:    General: Skin is warm and dry.  Neurological:     General: No focal deficit present.     Mental Status: She is alert and oriented to person, place, and time.  Psychiatric:        Mood and Affect: Mood normal.        Behavior: Behavior normal.      UC Treatments / Results  Labs (all labs ordered are listed, but only abnormal results are displayed) Labs Reviewed  RAPID INFLUENZA A&B ANTIGENS ([facility] ONLY)  RAPID STREP SCREEN ([facility] ONLY)  CULTURE, GROUP A STREP [facility])    EKG None  Radiology No results found.  Procedures Procedures (including critical care time)  Medications Ordered in UC Medications - No data to display  Initial Impression / Assessment and Plan / UC Course  I have reviewed the triage vital signs and the nursing notes.  Pertinent labs & imaging results that were available during my care of the patient were reviewed by me and considered in my medical decision making (see chart for details).    Patient she has an upper respiratory infection that is viral does not require antibiotics.  Use cool mist vaporizer.  In her a prescription for [employer]  Plenty of water get adequate  rest.  Final Clinical Impressions(s) / UC Diagnoses   Final diagnoses:  Upper respiratory tract infection, unspecified type   Discharge Instructions   None    ED Prescriptions    Medication Sig Dispense Auth. Provider   benzonatate (TESSALON) 200 MG capsule Take one cap TID PRN cough 30 capsule Lorin Picket, PA-C     Controlled Substance Prescriptions [place] Controlled Substance Registry consulted? Not Applicable   Lorin Picket, PA-C 08/29/18 2201

## 2018-09-01 LAB — CULTURE, GROUP A STREP (THRC)

## 2018-09-04 ENCOUNTER — Telehealth: Payer: Self-pay | Admitting: Family Medicine

## 2018-09-04 ENCOUNTER — Other Ambulatory Visit: Payer: Self-pay

## 2018-09-04 DIAGNOSIS — J0100 Acute maxillary sinusitis, unspecified: Secondary | ICD-10-CM

## 2018-09-04 MED ORDER — AMOXICILLIN-POT CLAVULANATE 875-125 MG PO TABS: 1.0000 | ORAL_TABLET | Freq: Two times a day (BID) | ORAL | 0 refills | Status: DC

## 2018-09-04 MED ORDER — SPIRONOLACTONE 50 MG PO TABS: 50.0000 mg | ORAL_TABLET | Freq: Two times a day (BID) | ORAL | 1 refills | Status: DC

## 2018-09-04 MED ORDER — IPRATROPIUM BROMIDE 0.06 % NA SOLN: 2.0000 | Freq: Four times a day (QID) | NASAL | 0 refills | Status: DC

## 2018-09-04 NOTE — Telephone Encounter (Signed)
Pt went to urgent care last week and tested negative for flu and strep.  They said it was upper respiratory and gave her tessalon for cough.  Please call [phone]

## 2018-09-04 NOTE — Telephone Encounter (Signed)
Patient was seen within 6 days at [facility] 3/13 - she does not qualify for telephone encounter visit due to recent E&M code.  Reviewed symptoms and called patient in triage. Age 33 without known respiratory co morbidity, she was considered low risk for complication and she has no known exposure to high risk contact for COVID19 and denies any travel to high risk area for COVID19.  Her mother works in health care but she does not have any contact that would be considered an exposure.  She did improve temporarily from tessalon and medicines for cough now seems worsening, sinus pressure and pain and congestion. Admits cough and dyspnea at times.  No fevers or chills or sweats, nausea vomiting body ache  I advised her that given low risk of complication, and no known exposure high risk covid19 criteria, she does not meet indication for testing. Instead of office visit for low risk patient, I opted to proceed with empiric treatment via telephone at this time, she has consented to this consultation and treatment via phone, and sent in augmentin antibiotic 10 days and atrovent nasal decongestant, she should self quarantine for now to be safe until determine symptoms improving, if screening criteria changes and she is not improving - she could consider that, meanwhile notify us if changes and advised when and where to seek care.  Nobie Putnam, [facility] 09/04/2018, 9:54 AM

## 2018-09-04 NOTE — Telephone Encounter (Signed)
As per patient she was diagnosed with URI but now getting worst with SOB, Cough and headache. She denies fever wants to be Rx antimitotics.

## 2018-09-16 DIAGNOSIS — F3181 Bipolar II disorder: Secondary | ICD-10-CM | POA: Diagnosis not present

## 2018-09-26 ENCOUNTER — Institutional Professional Consult (permissible substitution): Admit: 2018-09-26 | Discharge: 2018-09-27 | Payer: MEDICAID | Attending: Family | Primary: Family

## 2018-09-26 DIAGNOSIS — Z713 Dietary counseling and surveillance: Secondary | ICD-10-CM

## 2018-09-26 DIAGNOSIS — Z7189 Other specified counseling: Secondary | ICD-10-CM

## 2018-09-26 DIAGNOSIS — Z6841 Body Mass Index (BMI) 40.0 and over, adult: Principal | ICD-10-CM

## 2018-09-26 DIAGNOSIS — G4733 Obstructive sleep apnea (adult) (pediatric): Secondary | ICD-10-CM

## 2018-09-26 DIAGNOSIS — R7303 Prediabetes: Secondary | ICD-10-CM

## 2018-09-26 NOTE — Unmapped (Addendum)
TeleHeath Evaluation:  1. Patient location:  home  2. Provider location: home  3. Patient consented to telephone/video visit:  Yes  4. Length of time of visit: 15 min     This patient visit was completed through the use of an audio/video or telephone encounter.      This patient encounter is appropriate and reasonable under the circumstances given the patient's particular presentation at this time. The patient has been advised of the potential risks and limitations of this mode of treatment (including, but not limited to, the absence of in-person examination) and has agreed to be treated in a remote fashion in spite of them. Any and all of the patient's/patient's family's questions on this issue have been answered.      The patient has also been advised to contact this office for worsening conditions or problems, and seek emergency medical treatment and/or call 911 if the patient deems either necessary.    PCP: Smitty Cords, DO    CHIEF COMPLAINT:  Jacqueline Hall is following a medically monitored diet and exercise assistance program with [facility]:  visit # 7, month #6.  Initial weight: 324 lbs, she started the bariatric surgery process on 04/11/18.    HISTORY OF PRESENT ILLNESS:  She has a long history of obesity and has co-morbidities including: HTN, OSA, Bipolar.  Weight change: -14 lbs.  Wt Readings from Last 6 Encounters:   09/26/18 (!) 140.9 kg (310 lb 9.6 oz)   08/26/18 (!) 141.8 kg (312 lb 11.2 oz)   08/01/18 (!) 142.6 kg (314 lb 6.4 oz)   06/20/18 (!) 147.3 kg (324 lb 12.8 oz)   06/12/18 (!) 147.1 kg (324 lb 6.4 oz)   05/12/18 (!) 145.3 kg (320 lb 6.4 oz)       Health issues:  stable    EATING HABITS:  She describes her eating habits as: good.  She is eating 3 meals, and 1-2 snacks, feels meals are well balanced. She is planning meals.  Jacqueline Hall is focusing on a protein rich diet: protein sources come from: meat, poultry, dairy, seafood, fish.  Aims for 60 g/day. Vegetable intake is good variety, 1-2 servings a day. Carbohydrate intake: low.   She is practicing eating habit changes (not drinking with meals, avoiding carbonation, practicing mindfulness- such as listening to body's cues- feelings of fullness, satiety, eating slower).  Beverages: water, aims for 64 oz of low calorie fluids a day. Likes Premier protein shakes. Atkin's shakes. Equate version shake.   Eating out: denies  Vitamins: Bariatric advantage chewable MVI, Calcium.  Celebrate soft chews iron.   Alcohol: denies. Smoking: denies.    PHYSICAL ACTIVITY:  For exercise, she is walking, spending more time at home, but she has a [place] Dane dog that she walks.   Takes the stairs daily.     Exercise tolerance: denies. Has hips and knee pain  Any cardiac or pulmonary problems/concerns: denies    SLEEP:  Sleep: 9-10 hours per night. Sleep is restful.  Has a Hx Sleep apnea   Cpap Use: not using.    GI SYMPTOMS:  She is not experiencing GERD, dysphagia, bloating, or abdominal pain.   Bowel habits: regular.   PPI use: Prilosec 40 mg qd.  EGD: done on 07/17/16, normal    PSYCHOSOCIAL:  Mood: has a hx of bipolar and she sees a p[sychiatrist for her meds: Lamictal 100 mg qd. She feels ok.   Therapy: has therapy every 2 weeks  Support: mom, friends  Psych eval: done on 05/02/16, 07/30/2018    Surgical Understanding:  Surgery: sleeve  Requirements: completed  Benefits: reviewed  Risks: reviewed    RELEVANT TESTS/LAB RESULTS:  Lab Results   Component Value Date    WBC 12.3 (H) 06/20/2018    HGB 13.7 06/20/2018    HCT 41.7 06/20/2018    PLT 483 (H) 06/20/2018    CHOL 160 05/02/2016    TRIG 121 05/02/2016    HDL 31 (L) 05/02/2016    ALT 22 06/20/2018    AST 25 06/20/2018    NA 138 06/20/2018    K 4.7 06/20/2018    CL 105 06/20/2018    CREATININE 0.77 06/20/2018    BUN 21 06/20/2018    CO2 24.0 06/20/2018    TSH 2.529 06/20/2018    GLUF 111 (H) 12/06/2014     Lab Results   Component Value Date    VITAMINB12 399 06/20/2018 PAST MEDICAL HISTORY:  Past Medical History:   Diagnosis Date   ??? Abnormal Pap smear of cervix 2013    per patient; biopsy was done and then everything was fine   ??? Anemia 2013   ??? Ankylosing spondylitis (CMS-HCC)    ??? Arthritis 2018   ??? Bipolar 1 disorder (CMS-HCC) 2018   ??? Depression    ??? Disease of thyroid gland    ??? Elevated blood-pressure reading without diagnosis of hypertension    ??? Headache(784.0)     more than 2-3 times a week; takes Ibuprofen. could not afford prescription medication   ??? History of pre-eclampsia in prior pregnancy, currently pregnant 11/2011   ??? Hypertension    ??? Menorrhagia    ??? Morbid obesity with BMI of 50.0-59.9, adult (CMS-HCC)    ??? Obesity    ??? PTSD (post-traumatic stress disorder) 2018   ??? Sleep apnea        PAST SURGICAL HISTORY:  Past Surgical History:   Procedure Laterality Date   ??? CHOLECYSTECTOMY  12/20/2007    Initially done L/S then had some sort of complication, required endoscopy   ??? PR UPPER GI ENDOSCOPY,DIAGNOSIS N/A 07/17/2016    Procedure: UGI ENDO, INCLUDE ESOPHAGUS, STOMACH, & DUODENUM &/OR JEJUNUM; DX W/WO COLLECTION SPECIMN, BY BRUSH OR WASH;  Surgeon: Annie Paras, MD;  [facility];  Service: Gastroenterology   ??? TUBAL LIGATION         SOCIAL HISTORY:  Social History     Social History Narrative    Married 2 children, 1 stepchild.    Previously worked at [employer] in [employer], currently (09/05/2016) unemployed.    Lives in [place].    Has tubal ligation.      Social History     Tobacco Use   Smoking Status Never Smoker   Smokeless Tobacco Never Used       FAMILY HISTORY:  Family History   Problem Relation Age of Onset   ??? Diabetes Maternal Grandmother    ??? Hypertension Maternal Grandmother    ??? Heart disease Maternal Grandmother    ??? Dementia Maternal Grandmother         great grandmother   ??? Heart attack Maternal Grandmother    ??? Osteoporosis Maternal Grandmother         grandfathers mother   ??? Stroke Maternal Grandmother    ??? No Known Problems Mother    ??? Hypertension Father    ??? Hypertension Sister    ??? Hypertension Brother    ???  Heart disease Maternal Grandfather    ??? Atrial fibrillation Paternal Grandmother    ??? Heart failure Paternal Grandmother    ??? Hypothyroidism Maternal Aunt    ??? Migraines Maternal Aunt    ??? Multiple sclerosis Maternal Uncle    ??? Breast cancer Neg Hx    ??? Ovarian cancer Neg Hx        MEDICATIONS:  Outpatient Encounter Medications as of 09/26/2018   Medication Sig Dispense Refill   ??? ADALIMUMAB PEN CITRATE FREE 40 MG/0.4 ML Inject 0.4 mL (40 mg total) under the skin every fourteen (14) days. 6 each 1   ??? celecoxib (CELEBREX) 200 MG capsule Take 1 capsule (200 mg total) by mouth Two (2) times a day. 180 capsule 3   ??? cyanocobalamin, vitamin B-12, (NASCOBAL) 500 mcg/spray Spry 1 spray into 1 nare once a week 4 Bottle 11   ??? elagolix (ORILISSA) 150 mg Tab Take 150 mg by mouth daily.     ??? lamoTRIgine 25 mg (35) DsPk 25 mg daily.      ??? levothyroxine (SYNTHROID, LEVOTHROID) 25 MCG tablet Take 1.5 tablets (37.5 mcg total) by mouth daily. 90 tablet 3   ??? MEDICAL SUPPLY ITEM Please provide patient with 2 left carpal tunnel syndrome braces for wrists. 2 Units 0   ??? omeprazole (PRILOSEC) 40 MG capsule Take 40 mg by mouth.     ??? propranolol (INDERAL) 20 MG tablet 20 mg Two (2) times a day.      ??? spironolactone (ALDACTONE) 50 MG tablet Take 50 mg by mouth Two (2) times a day.     ??? topiramate (TOPAMAX) 50 MG tablet Take 1 tablet (50 mg total) by mouth every evening. 90 tablet 3     No facility-administered encounter medications on file as of 09/26/2018.        ALLERGIES:  Patient has no known allergies.      REVIEW OF SYMPTOMS:  A ten-point ROS was performed and is negative except for the pertinent findings in the HPI.    PHYSICAL EXAM:  Ht 154.9 cm (5' 1)  - Wt (!) 140.9 kg (310 lb 9.6 oz)  - LMP 09/19/2018 Comment: Tubal ligation - BMI 58.69 kg/m??    Patient reported weight: 310 lbs  Patient reported vital signs: unable ASSESSMENT:  1. Class 3 Obesity  Encounter Diagnoses   Name Primary?   ??? BMI 50.0-59.9, adult (CMS-HCC)    ??? Obstructive sleep apnea Yes   ??? Encounter for weight loss counseling    ??? Prediabetes    ??? Encounter for pre-bariatric surgery counseling and education      [place]iffl[place]-St Jeor BMR (calories per day):  2085    Plan:  Jacqueline Hall has been deemed to be an acceptable candidate for weight loss surgery and is considered medically necessary based on : her bMI and co-morbidities.    She has completed, and was compliant with, the necessary preoperative workup, and a 6 month medically monitored diet and exercise program. Has lost 14 lbs.   She feels ready to move forward with weight loss surgery, namely  Sleeve Gastrectomy.    We reviewed her medical history, IBW, eating habits, exercise habits, sleep habits, and reviewed her progress.     She will continue to exercise outside of her daily routine.  This can include walking, chair exercises, cardio, weights, bands, and resistance training, HIIT as tolerated, with a goal of 150 minutes per week.     She will follow the RD recommendations,  including the 2 week pre-operative modified liquid diet, see her note for full details dated 08/01/2018.    Preop Bariatric Surgery evaluation/workup completed:   Sleeve Gastrectomy.  ?? The individual's psychiatric profile is such that the candidate is able to understand, tolerate and comply with all phases of care and is committed to long-term follow-up requirements  ?? The candidate's post-operative expectations have been addressed as well as realistic goal weight  ?? The individual has undergone a preoperative medical consultation and is felt to be an acceptable surgical candidate  ?? The individual has undergone a preoperative mental health assessment and is felt to be an acceptable candidate  ?? The individual has received a thorough explanation of the risks, benefits, and uncertainties of the procedure  ?? The candidate's treatment plan included pre- and post-operative dietary evaluations and nutritional counseling  ?? The candidate's treatment plan included counseling regarding exercise, psychological issues and the availability of supportive resources when needed.    Preoperative requirements completed:       Completed        Supervised Diet  Started 04/11/18   EGD  07/17/2016- normal   Nutrition Class  07/25/2018   RD clearance  08/01/2018   Psych evaluation  07/30/2018   Letter of support - psych (if needed)  x   Labs  x   Urine tox/nicotine  x   PSG  x   Cardiology clearance: on file, under media      Gallbladder absent   Genella Rife neg   Diabetes Lab Results   Component Value Date    A1C 5.8 (H) 06/20/2018     prediabetes   Fertility status Tubal ligation   Referral on file x   Surgery Requested/chosen Sleeve. Dr Augustine Radar   Insurance BCBS     Recommend:   1. Continue to attend support group, or join online   2. Avoidance of nicotine, alcohol, blood thinners or NSAIDS long term   3. Start a stool softener 100 mg bid 1 week pre-op   4. Begin chewable multivitamin 2 weeks before surgery and continue taking upon discharge home from hospital    She understands the need to see PCP for all routine health care needs outside of Bariatric Surgery/Obesity Management.     Return to Clinic: Pre-op with surgeon  Then post operative visits at  2 weeks, 6 weeks, 3 months, 6 months, 1 year, 18 months, and yearly thereafter for a minimum of 5 years      Jacqueline Savin L. Samay Delcarlo MSN, FNP APRN-BC  [employer]- Dept of GI Surgery: bariatric Surgery  09/26/2018          See patient instructions.  An AVS summarizing details of the visit was sent electronically to the patient  I spent 15 minutes on the phone with the patient. I spent an additional 5 minutes on pre- and post-visit activities.     The patient was physically located in [place] or a state in which I am permitted to provide care. The patient understood that s/he may incur co-pays and cost sharing, and agreed to the telemedicine visit. The visit was completed via phone and/or video, which was appropriate and reasonable under the circumstances given the patient's presentation at the time.    The patient has been advised of the potential risks and limitations of this mode of treatment (including, but not limited to, the absence of in-person examination) and has agreed to be treated using telemedicine. The patient's/patient's family's questions regarding telemedicine have  been answered.     If the phone/video visit was completed in an ambulatory setting, the patient has also been advised to contact their provider???s office for worsening conditions, and seek emergency medical treatment and/or call 911 if the patient deems either necessary.

## 2018-09-26 NOTE — Unmapped (Signed)
PATIENT INSTRUCTIONS    You have now completed the supervised diet and exercise program your insurance has mandated.   Your chart will be submitted to your insurance for their final approval for weight loss surgery. This can take up to 6 weeks to be approved.  Our coordinator, Misty Stanley, will notify you once approved. She can be reached at [phone]. [email or web address]    The next appointment will be with the surgeon, at [facility] at [address] with Doctor Overby.    On your pre-op appointment with the surgeon, the type of surgery will be confirmed, and a surgery date will be given.     It is important to eat as closely to a post-surgical diet as possible even before surgery.    Be sure to have the correct/approved protein shakes on hand pre-operatively.  A LIQUID diet is necessary 2 week(s) before surgery      Avoid Aspirin, NSAID'S x 1 week preop (reduces risk bleeding)  Take a stool softener daily 1 week preop, and keep taking 2 weeks post op  Be sure you have the correct bariatric vitamins you need, and have sampled some.  Start the vitamins 2 weeks BEFORE surgery (while on the preop diet) and resume them when you get home from the hospital.     Exercise as tolerated 5 days a week 20-30 minutes or more, use a pedometer.   Alternate cardiovascular with resistance training 2-3# hand weights- biceps and triceps- during commercials    Facebook- [place] W[facility] Support Group    Support Groups:     Occur 2x's a month  check website for more information   [email or web address]      Important websites: [email or web address], or [email or web address]      My contact:    [email or web address]

## 2018-09-29 ENCOUNTER — Other Ambulatory Visit: Payer: Self-pay | Admitting: Obstetrics and Gynecology

## 2018-09-30 ENCOUNTER — Encounter: Payer: Self-pay | Admitting: Family Medicine

## 2018-09-30 ENCOUNTER — Ambulatory Visit (INDEPENDENT_AMBULATORY_CARE_PROVIDER_SITE_OTHER): Payer: BLUE CROSS/BLUE SHIELD | Admitting: Family Medicine

## 2018-09-30 ENCOUNTER — Other Ambulatory Visit: Payer: Self-pay

## 2018-09-30 DIAGNOSIS — M450 Ankylosing spondylitis of multiple sites in spine: Secondary | ICD-10-CM | POA: Diagnosis not present

## 2018-09-30 DIAGNOSIS — F4310 Post-traumatic stress disorder, unspecified: Secondary | ICD-10-CM | POA: Diagnosis not present

## 2018-09-30 DIAGNOSIS — M461 Sacroiliitis, not elsewhere classified: Secondary | ICD-10-CM | POA: Diagnosis not present

## 2018-09-30 DIAGNOSIS — F314 Bipolar disorder, current episode depressed, severe, without psychotic features: Secondary | ICD-10-CM

## 2018-09-30 NOTE — Progress Notes (Signed)
FMLA Form Completion: Received FMLA paperwork for patient RENEWAL request  Company: [employer]  Part A: Background Information: 1. Approximate date of onset for condition requesting leave - 03/2018 original onset 2. Probable duration of patient's condition: >12 months, chronic, lifelong 3. Dates you have treated patient: 02/19/18, 04/17/18, 09/30/18  Part B: Serious Health Condition: Inpatient Care - No Pregnancy - No Prescribed medication for condition other than OTC? - Yes Chronic Condition     - Yes (requires at least 2 visits per year for treatment)  Episodic Flare-ups - Yes - pain limiting prolonged sitting and ambulation. Also mood swings, difficulty focusing. Unable to perform job function with flare up only  Part C: Amount and Type of Leave Needed 1. Continuous Leave - No 2. Reduced Schedule - No 3. Intermittent Leave - Yes       - Start Date / End Date - 09/30/18 - 1 year - 09/30/19       - Estimated Frequency for treatments - 4-8 hours per visit/treatment, up to 4 per 1 month. (note - PT or short visit up to 4 hours, and doctors visit out of town [facility] up to 8 hours or 1 day)  - Estimated frequency for episodes of incapacity - 2-3 days per episode, 2 episodes per 1 month approx  Clinical information: #10 - Diagnosis w/ sacroilitis, rheumatoid unspecified condition, painful flare-ups with low back pain, has had imaging x-ray, MRI pelvis 12/2017 most recent, followed by [facility] Rheumatology, on immunotherapy and NSAID and Physical Therapy PT. Also Bipolar Depression/PTSD, on mood stabilizer per Psychiatry and on therapy counseling  Contacted patient today 09/30/18, reviewed my completion of this document with her at this time.  Completed, signed, and dated FMLA paperwork. To be scanned into chart and patient notified that ready for pick-up. She will submit paperwork accordingly.  Nobie Putnam, [facility] 09/30/2018, 1:50  PM

## 2018-09-30 NOTE — Progress Notes (Signed)
Virtual Visit via Telephone The purpose of this virtual visit is to provide medical care while limiting exposure to the novel coronavirus (COVID19) for both patient and office staff.  Consent was obtained for phone visit:  Yes.   Answered questions that patient had about telehealth interaction:  Yes.   I discussed the limitations, risks, security and privacy concerns of performing an evaluation and management service by telephone. I also discussed with the patient that there may be a patient responsible charge related to this service. The patient expressed understanding and agreed to proceed.  Patient Location: Home Provider Location: [facility])  ---------------------------------------------------------------------- Chief Complaint  Patient presents with  . Back Pain    FMLA  . Sacroilitis  . Manic Behavior    S: Reviewed CMA telephone note below. I have called patient and gathered additional HPI as follows:  Sacroiliitis / Rheumatoid condition unspecified Followed by [facility] Rheumatology, continues regular PT regimen often at [facility] as well with good results, on immunotherapy with Humira, and has been on NSAID among other therapy. Reports overall has persistent issues with chronic back and sacroiliac pain and inflammation in joints. This problem causes flare up occasionally, limiting her function due to back and hip pain and joint pain flare up, limiting her ability to be prolonged sitting. - Previously had FMLA for this reason for past 6 months allow her to continue PT visits and treat flare up as needed. Now due for renewal of FMLA - Denies any recent injury fall trauma,worsening joint swelling pain or redness, weakness numbness tingling  Bipolar Depression / PTSD Followed by Psychiatry and Therapist. Currently on Lamictal for mood stabilization. She continues to benefit from medication management and regular therapy counseling visits. She has occasionally had  episodes of mood swings affecting her mental health and ability to function and work properly with difficulty focusing intermittently. - Currently seems controlled - Denies any severe depression, suicidal or homicidal ideation, severe mania  Denies any high risk travel to areas of current concern for COVID19. Denies any known or suspected exposure to person with or possibly with COVID19.  Denies any fevers, chills, sweats, body ache, cough, shortness of breath, sinus pain or pressure, headache, abdominal pain, diarrhea  Past Medical History:  Diagnosis Date  . Abnormal Pap smear of cervix   . Arthritis   . Arthritis, rheumatoid ([place])   . Bipolar 1 disorder ([place])   . Chest pain   . Headache    MIGRAINES  . Hyperlipidemia   . Hypertension   . Palpitations    a. 09/2017 Zio Monitor: predominantly RSR, avg HR 87, 11 A tach/SVT episodes, fastest 176 x 7 beats, longest 9 beats. Rare PAC's, occas PVC's, rare couplets/triplets.  Marland Kitchen PTSD (post-traumatic stress disorder)   . PVC (premature ventricular contraction)   . Social phobia    Social History   Tobacco Use  . Smoking status: Never Smoker  . Smokeless tobacco: Never Used  Substance Use Topics  . Alcohol use: Not Currently    Alcohol/week: 0.0 standard drinks  . Drug use: No    Current Outpatient Medications:  .  Adalimumab (HUMIRA PEN [place]), Inject into the skin., Disp: , Rfl:  .  celecoxib (CELEBREX) 200 MG capsule, Take 200 mg by mouth 2 (two) times daily., Disp: , Rfl:  .  Cyanocobalamin (NASCOBAL) 500 MCG/0.1ML SOLN, 1 spray into 1 nare once a week, Disp: , Rfl:  .  Elagolix Sodium (ORILISSA) 150 MG TABS, Take 150 mg by  mouth daily., Disp: 30 tablet, Rfl: 11 .  lamoTRIgine (LAMICTAL) 100 MG tablet, Take 100 mg by mouth daily. , Disp: , Rfl:  .  levothyroxine (SYNTHROID, LEVOTHROID) 25 MCG tablet, Take 1 tablet (25 mcg total) by mouth daily before breakfast., Disp: 90 tablet, Rfl: 3 .  magnesium oxide (MAG-OX) 400 MG tablet,  Take 1 tablet (400 mg total) by mouth daily., Disp: 90 tablet, Rfl: 3 .  omeprazole (PRILOSEC) 40 MG capsule, Take 40 mg by mouth daily., Disp: , Rfl:  .  propranolol ER (INDERAL LA) 60 MG 24 hr capsule, Take 1 capsule (60 mg total) by mouth daily., Disp: 90 capsule, Rfl: 3 .  spironolactone (ALDACTONE) 50 MG tablet, TAKE 1 TABLET BY MOUTH TWICE A DAY, Disp: 60 tablet, Rfl: 1 .  topiramate (TOPAMAX) 25 MG tablet, Take 50 mg by mouth daily. 50MG  ONCE DAILY, Disp: , Rfl:  .  MAGOX 400 400 (241.3 Mg) MG tablet, , Disp: , Rfl:   Depression screen [facility] 2/9 09/30/2018 05/06/2018 04/17/2018  Decreased Interest 0 0 0  Down, Depressed, Hopeless 0 0 0  PHQ - 2 Score 0 0 0  Altered sleeping 0 0 0  Tired, decreased energy 0 0 0  Change in appetite 0 0 0  Feeling bad or failure about yourself  0 0 0  Trouble concentrating 2 0 0  Moving slowly or fidgety/restless 0 0 0  Suicidal thoughts 0 0 0  PHQ-9 Score 2 0 0  Difficult doing work/chores Somewhat difficult - -    GAD 7 : Generalized Anxiety Score 09/30/2018 04/17/2018 09/07/2016 05/28/2016  Nervous, Anxious, on Edge 0 0 3 3  Control/stop worrying 1 0 3 1  Worry too much - different things 2 0 3 1  Trouble relaxing 0 0 1 2  Restless 0 0 0 0  Easily annoyed or irritable 3 0 3 3  Afraid - awful might happen 3 0 3 0  Total GAD 7 Score 9 0 16 10  Anxiety Difficulty Not difficult at all Not difficult at all Somewhat difficult Not difficult at all    -------------------------------------------------------------------------- O: No physical exam performed due to remote telephone encounter.  Lab results reviewed.  Recent Results (from the past 2160 hour(s))  DHEA-sulfate     Status: None   Collection Time: 07/23/18 11:39 AM  Result Value Ref Range   DHEA-SO4 267.2 84.8 - 650.3 ug/dL  Follicle stimulating hormone     Status: None   Collection Time: 07/23/18 11:39 AM  Result Value Ref Range   FSH 7.9 mIU/mL    Comment:                     Adult  Female:                       Follicular phase      3.5 -  12.5                       Ovulation phase       4.7 -  21.5                       Luteal phase          1.7 -   7.7                       Postmenopausal  25.8 - 134.8   Insulin, random     Status: Abnormal   Collection Time: 07/23/18 11:39 AM  Result Value Ref Range   INSULIN 44.7 (H) 2.6 - 24.9 uIU/mL  Progesterone     Status: None   Collection Time: 07/23/18 11:39 AM  Result Value Ref Range   Progesterone 0.2 ng/mL    Comment:                      Follicular phase       0.1 -   0.9                      Luteal phase           1.8 -  23.9                      Ovulation phase        0.1 -  12.0                      Pregnant                         First trimester    11.0 -  44.3                         Second trimester   25.4 -  83.3                         Third trimester    58.7 - 214.0                      Postmenopausal         0.0 -   0.1   Prolactin     Status: None   Collection Time: 07/23/18 11:39 AM  Result Value Ref Range   Prolactin 6.5 4.8 - 23.3 ng/mL  Testosterone, Free, Total, SHBG     Status: Abnormal   Collection Time: 07/23/18 11:39 AM  Result Value Ref Range   Testosterone 55 (H) 8 - 48 ng/dL   Testosterone, Free 3.0 0.0 - 4.2 pg/mL   Sex Hormone Binding 16.5 (L) 24.6 - 122.0 nmol/L  Rapid Influenza A&B Antigens ([facility] only)     Status: None   Collection Time: 08/29/18  3:50 PM  Result Value Ref Range   Influenza A ([facility]) NEGATIVE NEGATIVE   Influenza B ([facility]) NEGATIVE NEGATIVE    Comment: Performed at [facility], [address] [place], [place] [address]  Rapid Strep Screen ([facility] ONLY)     Status: None   Collection Time: 08/29/18  3:50 PM  Result Value Ref Range   Streptococcus, Group A Screen (Direct) NEGATIVE NEGATIVE    Comment: (NOTE) A Rapid Antigen test may result negative if the antigen level in the sample is below the detection level of this test. The FDA  has not cleared this test as a stand-alone test therefore the rapid antigen negative result has reflexed to a Group A Strep culture. Performed at [facility], [address], [place], [place] [address]   Culture, group A strep     Status: None   Collection Time: 08/29/18  3:50 PM  Result Value Ref Range   Specimen Description      THROAT Performed at [facility], [address], [place], [place] [address]    Special Requests      NONE Reflexed from [id number] Performed at [facility], [address] [place], [place] [address]    Culture      NO GROUP A STREP (S.PYOGENES) ISOLATED Performed at [facility], [place] [address] [place], [place] [address]    Report Status 09/01/2018 FINAL     -------------------------------------------------------------------------- A&P:  Problem List Items Addressed This Visit    Ankylosing spondylitis ([place])   Sacroiliac inflammation ([place]) - Primary  Followed by [facility] Rheumatology and PT regular regimen Continues on immunotherapy Humira and NSAID therapy  Renewal for FMLA done today, for intermittent leave - see below   PTSD (post-traumatic stress disorder)      Severe bipolar I disorder, most recent episode depressed without psychotic features ([place])    Clinically improved and stable control of bipolar mood disorder and associated PTSD on current mood stabilizing medication, per Psychiatry and Therapist with counseling  Renewal for FMLA done today, for intermittent leave  ----------------------------------  Please see separate documentation encounter for details of FMLA, briefly listed below - Type of leave: Intermittent - Last certified [place] [id number]/[phone] through 09/2018 - 6 months duration - Renewal now for 1 year, 09/30/18 through 09/30/19 approximate - Requested intermittent timing with:    - For treatment/visits - up to 4-8 hours per visit, up to 4 times a month     - For acute flare - up to 2-3  days per flare, up to 2 times per month  Completed FMLA paperwork to be scanned and patient notified when ready today 09/30/18 she will arrange pick up and she will submit accordingly.   No orders of the defined types were placed in this encounter.   Follow-up: As needed Keep regularly scheduled f/u  Patient verbalizes understanding with the above medical recommendations including the limitation of remote medical advice.  Specific follow-up and call-back criteria were given for patient to follow-up or seek medical care more urgently if needed.  - Time spent in direct consultation with patient on phone: 11 minutes  Nobie Putnam, [facility] 09/30/2018, 10:31 AM

## 2018-09-30 NOTE — Patient Instructions (Addendum)
FMLA paperwork completed today, for 1 year authorized intermittent leave due to sacroilitis / rheumatoid and also bipolar  It was completed for the following recommendations  - For office treatments/visits - allowed up to 4-8 hours per visit/treatment up to 4 times a month - For flares or incapacity due to these conditions - allowed up to 2-3 days per flare - up to 2 times per 1 month  Notify office if questions with paperwork

## 2018-10-02 MED ORDER — HUMIRA PEN CITRATE FREE 40 MG/0.4 ML
3 refills | 0 days | Status: CP
Start: 2018-10-02 — End: ?

## 2018-10-02 NOTE — Unmapped (Signed)
Adalimumab (Humira) refill  Last ov: 04/29/2018  Next ov: 01/01/2019   Labs:   AST   Date Value Ref Range Status   06/20/2018 25 14 - 38 U/L Final   12/08/2011 29 14 - 38 U/L Final     ALT   Date Value Ref Range Status   06/20/2018 22 <35 U/L Final   12/08/2011 28 15 - 48 U/L Final     Creatinine   Date Value Ref Range Status   06/20/2018 0.77 0.60 - 1.00 mg/dL Final   13/01/6577 4.69 0.60 - 1.00 MG/DL Final     WBC   Date Value Ref Range Status   06/20/2018 12.3 (H) 4.5 - 11.0 10*9/L Final   12/08/2011 13.4 (H) 4.5 - 11.0 x10 9th/L Final     HGB   Date Value Ref Range Status   06/20/2018 13.7 13.5 - 16.0 g/dL Final   62/95/2841 32.4 12.0 - 16.0 G/DL Final     HCT   Date Value Ref Range Status   06/20/2018 41.7 36.0 - 46.0 % Final   12/08/2011 39.0 36.0 - 46.0 % Final     MCV   Date Value Ref Range Status   06/20/2018 83.4 80.0 - 100.0 fL Final   12/08/2011 87 80 - 100 FL Final     RDW   Date Value Ref Range Status   06/20/2018 15.9 (H) 12.0 - 15.0 % Final   12/08/2011 13.4 12.0 - 15.0 % Final     Platelet   Date Value Ref Range Status   06/20/2018 483 (H) 150 - 440 10*9/L Final   12/08/2011 276 150 - 440 x10 9th/L Final

## 2018-10-07 NOTE — Unmapped (Signed)
Call placed to patient to advise authorization confirmed to reflect Dr. Augustine Radar as her surgeon.

## 2018-10-14 ENCOUNTER — Other Ambulatory Visit (INDEPENDENT_AMBULATORY_CARE_PROVIDER_SITE_OTHER): Payer: BLUE CROSS/BLUE SHIELD | Admitting: Family Medicine

## 2018-10-14 ENCOUNTER — Other Ambulatory Visit: Payer: Self-pay

## 2018-10-14 ENCOUNTER — Other Ambulatory Visit: Payer: Self-pay | Admitting: Family Medicine

## 2018-10-14 DIAGNOSIS — N390 Urinary tract infection, site not specified: Secondary | ICD-10-CM

## 2018-10-14 DIAGNOSIS — E559 Vitamin D deficiency, unspecified: Secondary | ICD-10-CM

## 2018-10-14 DIAGNOSIS — E039 Hypothyroidism, unspecified: Secondary | ICD-10-CM

## 2018-10-14 DIAGNOSIS — I10 Essential (primary) hypertension: Secondary | ICD-10-CM | POA: Diagnosis not present

## 2018-10-14 DIAGNOSIS — E785 Hyperlipidemia, unspecified: Secondary | ICD-10-CM

## 2018-10-14 DIAGNOSIS — D500 Iron deficiency anemia secondary to blood loss (chronic): Secondary | ICD-10-CM

## 2018-10-14 DIAGNOSIS — R7309 Other abnormal glucose: Secondary | ICD-10-CM

## 2018-10-14 LAB — POCT URINALYSIS DIPSTICK
Bilirubin, UA: NEGATIVE
Blood, UA: NEGATIVE
Glucose, UA: NEGATIVE
Ketones, UA: NEGATIVE
Leukocytes, UA: NEGATIVE
Nitrite, UA: NEGATIVE
Protein, UA: NEGATIVE
Spec Grav, UA: 1.01 (ref 1.010–1.025)
Urobilinogen, UA: 0.2 E.U./dL
pH, UA: 5 (ref 5.0–8.0)

## 2018-10-15 DIAGNOSIS — F3181 Bipolar II disorder: Secondary | ICD-10-CM | POA: Diagnosis not present

## 2018-10-15 LAB — CBC WITH DIFFERENTIAL/PLATELET
Absolute Monocytes: 783 cells/uL (ref 200–950)
Basophils Absolute: 52 cells/uL (ref 0–200)
Basophils Relative: 0.6 %
Eosinophils Absolute: 198 cells/uL (ref 15–500)
Eosinophils Relative: 2.3 %
HCT: 41.9 % (ref 35.0–45.0)
Hemoglobin: 14.2 g/dL (ref 11.7–15.5)
Lymphs Abs: 2288 cells/uL (ref 850–3900)
MCH: 27.8 pg (ref 27.0–33.0)
MCHC: 33.9 g/dL (ref 32.0–36.0)
MCV: 82 fL (ref 80.0–100.0)
MPV: 9.8 fL (ref 7.5–12.5)
Monocytes Relative: 9.1 %
Neutro Abs: 5280 cells/uL (ref 1500–7800)
Neutrophils Relative %: 61.4 %
Platelets: 432 10*3/uL — ABNORMAL HIGH (ref 140–400)
RBC: 5.11 10*6/uL — ABNORMAL HIGH (ref 3.80–5.10)
RDW: 14.2 % (ref 11.0–15.0)
Total Lymphocyte: 26.6 %
WBC: 8.6 10*3/uL (ref 3.8–10.8)

## 2018-10-15 LAB — COMPLETE METABOLIC PANEL WITH GFR
AG Ratio: 1.7 (calc) (ref 1.0–2.5)
ALT: 20 U/L (ref 6–29)
AST: 17 U/L (ref 10–30)
Albumin: 4.4 g/dL (ref 3.6–5.1)
Alkaline phosphatase (APISO): 50 U/L (ref 31–125)
BUN: 21 mg/dL (ref 7–25)
CO2: 25 mmol/L (ref 20–32)
Calcium: 9.7 mg/dL (ref 8.6–10.2)
Chloride: 105 mmol/L (ref 98–110)
Creat: 0.91 mg/dL (ref 0.50–1.10)
GFR, Est African American: 98 mL/min/{1.73_m2} (ref >=60)
GFR, Est Non African American: 85 mL/min/{1.73_m2} (ref >=60)
Globulin: 2.6 g/dL (calc) (ref 1.9–3.7)
Glucose, Bld: 107 mg/dL — ABNORMAL HIGH (ref 65–99)
Potassium: 4.5 mmol/L (ref 3.5–5.3)
Sodium: 139 mmol/L (ref 135–146)
Total Bilirubin: 0.3 mg/dL (ref 0.2–1.2)
Total Protein: 7 g/dL (ref 6.1–8.1)

## 2018-10-15 LAB — T4, FREE: Free T4: 1 ng/dL (ref 0.8–1.8)

## 2018-10-15 LAB — HEMOGLOBIN A1C
Hgb A1c MFr Bld: 5.8 % of total Hgb — ABNORMAL HIGH (ref <5.7)
Mean Plasma Glucose: 120 (calc)
eAG (mmol/L): 6.6 (calc)

## 2018-10-15 LAB — LIPID PANEL
Cholesterol: 141 mg/dL (ref <200)
HDL: 27 mg/dL — ABNORMAL LOW (ref >=50)
LDL Cholesterol (Calc): 93 mg/dL (calc)
Non-HDL Cholesterol (Calc): 114 mg/dL (calc) (ref <130)
Total CHOL/HDL Ratio: 5.2 (calc) — ABNORMAL HIGH (ref <5.0)
Triglycerides: 118 mg/dL (ref <150)

## 2018-10-15 LAB — URINE CULTURE
MICRO NUMBER:: 429174
SPECIMEN QUALITY:: ADEQUATE

## 2018-10-15 LAB — TSH: TSH: 2.59 mIU/L

## 2018-10-15 LAB — VITAMIN D 25 HYDROXY (VIT D DEFICIENCY, FRACTURES): Vit D, 25-Hydroxy: 26 ng/mL — ABNORMAL LOW (ref 30–100)

## 2018-10-21 ENCOUNTER — Ambulatory Visit (INDEPENDENT_AMBULATORY_CARE_PROVIDER_SITE_OTHER): Payer: BLUE CROSS/BLUE SHIELD | Admitting: Family Medicine

## 2018-10-21 ENCOUNTER — Other Ambulatory Visit: Payer: Self-pay

## 2018-10-21 ENCOUNTER — Encounter: Payer: Self-pay | Admitting: Family Medicine

## 2018-10-21 ENCOUNTER — Telehealth: Payer: Self-pay

## 2018-10-21 VITALS — Ht 61.0 in | Wt 310.0 lb

## 2018-10-21 DIAGNOSIS — I10 Essential (primary) hypertension: Secondary | ICD-10-CM

## 2018-10-21 DIAGNOSIS — E039 Hypothyroidism, unspecified: Secondary | ICD-10-CM | POA: Diagnosis not present

## 2018-10-21 DIAGNOSIS — R7309 Other abnormal glucose: Secondary | ICD-10-CM | POA: Diagnosis not present

## 2018-10-21 NOTE — Assessment & Plan Note (Addendum)
Followed by [facility], Jolaine Click NP Improving wt loss still non surgical - but she is scheduled in future for evaluation for Gastric Sleeve Surgery (elective) if proceeds in near future, now on hold due to coronavirus restrictions on elective surgery  May need to reduce BP medication after wt loss surgery

## 2018-10-21 NOTE — Assessment & Plan Note (Signed)
[place], Controlled hypothyroidism on levothyroxine Last TSH / Free T4 normal 09/2018  Plan: 1. Continue current dose Levothyroxine from 86mcg daily 2. Follow-up next lab in 6 months approx

## 2018-10-21 NOTE — Patient Instructions (Addendum)
Keep up the good work overall with weight loss.  Your A1c sugar is improved.  Recent Labs    02/10/18 0838 10/14/18 1125  HGBA1C 6.0* 5.8*   BP might be slightly lower due to weight loss - keep a close watch on this - you may need to reduce Spironolactone (Dr Marcelline Mates) or Propranolol (Dr Caryl Comes) - can ask them or me about this if needed - especially after your wt loss surgery.  Follow up with Jolaine Click FNP for your upcoming gastric sleeve - keep me posted.  I would anticipate next visit with me in 6 months approximately for likely Yearly Physical if covered / with labs OR we can just do a check up if you have had surgery recently. Let me know when ready for labs again - would be no sooner than 6 months.  Please schedule a Follow-up Appointment to: Return in about 6 months (around 04/23/2019) for Annual Physical vs 6 mo follow-up.  If you have any other questions or concerns, please feel free to call the office or send a message through [place]. You may also schedule an earlier appointment if necessary.  Additionally, you may be receiving a survey about your experience at our office within a few days to 1 week by e-mail or mail. We value your feedback.  Nobie Putnam, DO [place]

## 2018-10-21 NOTE — Telephone Encounter (Signed)
Virtual Visit Pre-Appointment Phone Call  "Leah Osborne, I am calling you today to discuss your upcoming appointment. We are currently trying to limit exposure to the virus that causes COVID-19 by seeing patients at home rather than in the office."  1. "What is the BEST phone number to call the day of the visit?" - include this in appointment notes  2. Do you have or have access to (through a family member/friend) a smartphone with video capability that we can use for your visit?" a. If yes - list this number in appt notes as cell (if different from BEST phone #) and list the appointment type as a VIDEO visit in appointment notes b. If no - list the appointment type as a PHONE visit in appointment notes  3. Confirm consent - "In the setting of the current Covid19 crisis, you are scheduled for a phone visit with your provider on Nov 05, 2018 at 2:00PM.  Just as we do with many in-office visits, in order for you to participate in this visit, we must obtain consent.  If you'd like, I can send this to your mychart (if signed up) or email for you to review.  Otherwise, I can obtain your verbal consent now.  All virtual visits are billed to your insurance company just like a normal visit would be.  By agreeing to a virtual visit, we'd like you to understand that the technology does not allow for your provider to perform an examination, and thus may limit your provider's ability to fully assess your condition. If your provider identifies any concerns that need to be evaluated in person, we will make arrangements to do so.  Finally, though the technology is pretty good, we cannot assure that it will always work on either your or our end, and in the setting of a video visit, we may have to convert it to a phone-only visit.  In either situation, we cannot ensure that we have a secure connection.  Are you willing to proceed?" STAFF: Did the patient verbally acknowledge consent to telehealth visit? Document YES/NO  here: YES  4. Advise patient to be prepared - "Two hours prior to your appointment, go ahead and check your blood pressure, pulse, oxygen saturation, and your weight (if you have the equipment to check those) and write them all down. When your visit starts, your provider will ask you for this information. If you have an Apple Watch or Kardia device, please plan to have heart rate information ready on the day of your appointment. Please have a pen and paper handy nearby the day of the visit as well."  5. Give patient instructions for MyChart download to smartphone OR Doximity/[email or web address] as below if video visit (depending on what platform provider is using)  6. Inform patient they will receive a phone call 15 minutes prior to their appointment time (may be from unknown caller ID) so they should be prepared to answer    Leah Osborne has been deemed a candidate for a follow-up tele-health visit to limit community exposure during the Covid-19 pandemic. I spoke with the patient via phone to ensure availability of phone/video source, confirm preferred email & phone number, and discuss instructions and expectations.  I reminded Leah Osborne to be prepared with any vital sign and/or heart rhythm information that could potentially be obtained via home monitoring, at the time of her visit. I reminded Leah Osborne to expect a phone call prior to  her visit.  Leah Osborne 10/21/2018 4:18 PM  IF USING DOXIMITY or DOXY.ME - The patient will receive a link just prior to their visit by text.     FULL LENGTH CONSENT FOR TELE-HEALTH VISIT   I hereby voluntarily request, consent and authorize [facility] and its employed or contracted physicians, physician assistants, nurse practitioners or other licensed health care professionals (the Practitioner), to provide me with telemedicine health care services (the Services") as deemed necessary by the treating  Practitioner. I acknowledge and consent to receive the Services by the Practitioner via telemedicine. I understand that the telemedicine visit will involve communicating with the Practitioner through live audiovisual communication technology and the disclosure of certain medical information by electronic transmission. I acknowledge that I have been given the opportunity to request an in-person assessment or other available alternative prior to the telemedicine visit and am voluntarily participating in the telemedicine visit.  I understand that I have the right to withhold or withdraw my consent to the use of telemedicine in the course of my care at any time, without affecting my right to future care or treatment, and that the Practitioner or I may terminate the telemedicine visit at any time. I understand that I have the right to inspect all information obtained and/or recorded in the course of the telemedicine visit and may receive copies of available information for a reasonable fee.  I understand that some of the potential risks of receiving the Services via telemedicine include:   Delay or interruption in medical evaluation due to technological equipment failure or disruption;  Information transmitted may not be sufficient (e.g. poor resolution of images) to allow for appropriate medical decision making by the Practitioner; and/or   In rare instances, security protocols could fail, causing a breach of personal health information.  Furthermore, I acknowledge that it is my responsibility to provide information about my medical history, conditions and care that is complete and accurate to the best of my ability. I acknowledge that Practitioner's advice, recommendations, and/or decision may be based on factors not within their control, such as incomplete or inaccurate data provided by me or distortions of diagnostic images or specimens that may result from electronic transmissions. I understand that the  practice of medicine is not an exact science and that Practitioner makes no warranties or guarantees regarding treatment outcomes. I acknowledge that I will receive a copy of this consent concurrently upon execution via email to the email address I last provided but may also request a printed copy by calling the office of [facility].    I understand that my insurance will be billed for this visit.   I have read or had this consent read to me.  I understand the contents of this consent, which adequately explains the benefits and risks of the Services being provided via telemedicine.   I have been provided ample opportunity to ask questions regarding this consent and the Services and have had my questions answered to my satisfaction.  I give my informed consent for the services to be provided through the use of telemedicine in my medical care  By participating in this telemedicine visit I agree to the above.

## 2018-10-21 NOTE — Progress Notes (Signed)
Virtual Visit via Telephone The purpose of this virtual visit is to provide medical care while limiting exposure to the novel coronavirus (COVID19) for both patient and office staff.  Consent was obtained for phone visit:  Yes.   Answered questions that patient had about telehealth interaction:  Yes.   I discussed the limitations, risks, security and privacy concerns of performing an evaluation and management service by telephone. I also discussed with the patient that there may be a patient responsible charge related to this service. The patient expressed understanding and agreed to proceed.  Patient Location: Home Provider Location: [facility])  ---------------------------------------------------------------------- Chief Complaint  Patient presents with  . Hypothyroidism  . elevated A1C    S: Reviewed CMA documentation. I have called patient and gathered additional HPI as follows:  Pre-Diabetes / Elevated A1c Reports doing well with recent weight loss, sugar improved. Her last A1c lab was 5.8, previously 6.0 Meds: None - previously had intolerance on Metformin XR 500 Denies hypoglycemia, polyuria, visual changes, numbness or tingling.  Hypothyroidism Last lab showed normal TSH Free T4 Continues on levothyroxine 10mcg daily.  Morbid Obesity BMI >58 Followed by [facility], Jolaine Click, has been working on weight loss efforts and also future surgical plans. Down about 14 lbs in interval. Last wt with bariatric was approx 310 lbs. - Now anticipated Gastric Sleeve Surgery, TBD (elective) - once surgery able to proceed for elective procedure, she will schedule date in future.   Updates on other chronic conditions - Bipolar - Her psychiatry increased Lamictal 200mg  daily from 100  - Low BP readings - reports now with wt loss, some occasional lower BP readings, but she is asymptomatic. 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 Also taking Spironolactone 50mg  daily from Dr Marcelline Mates for PCOS  Denies any high risk travel to areas of current concern for [place]. Denies any known or suspected exposure to person with or possibly with COVID19.  Denies any fevers, chills, sweats, body ache, cough, shortness of breath, sinus pain or pressure, headache, abdominal pain, diarrhea  Past Medical History:  Diagnosis Date  . Abnormal Pap smear of cervix   . Arthritis   . Arthritis, rheumatoid ([place])   . Bipolar 1 disorder ([place])   . Chest pain   . Headache    MIGRAINES  . Hyperlipidemia   . Hypertension   . Palpitations    a. 09/2017 Zio Monitor: predominantly RSR, avg HR 87, 11 A tach/SVT episodes, fastest 176 x 7 beats, longest 9 beats. Rare PAC's, occas PVC's, rare couplets/triplets.  Marland Kitchen PTSD (post-traumatic stress disorder)   . PVC (premature ventricular contraction)   . Social phobia    Social History   Tobacco Use  . Smoking status: Never Smoker  . Smokeless tobacco: Never Used  Substance Use Topics  . Alcohol use: Not Currently    Alcohol/week: 0.0 standard drinks  . Drug use: No    Current Outpatient Medications:  .  Adalimumab (HUMIRA PEN [place]), Inject into the skin., Disp: , Rfl:  .  celecoxib (CELEBREX) 200 MG capsule, Take 200 mg by mouth 2 (two) times daily., Disp: , Rfl:  .  Cyanocobalamin (NASCOBAL) 500 MCG/0.1ML SOLN, 1 spray into 1 nare once a week, Disp: , Rfl:  .  Elagolix Sodium (ORILISSA) 150 MG TABS, Take 150 mg by mouth daily., Disp: 30 tablet, Rfl: 11 .  lamoTRIgine (LAMICTAL) 200 MG tablet, Take 200 mg by mouth daily., Disp: ,  Rfl:  .  levothyroxine (SYNTHROID, LEVOTHROID) 25 MCG tablet, Take 1 tablet (25 mcg total) by mouth daily before breakfast., Disp: 90 tablet, Rfl: 3 .  magnesium oxide (MAG-OX) 400 MG tablet, Take 1 tablet (400 mg total) by mouth daily., Disp: 90 tablet, Rfl: 3 .  omeprazole (PRILOSEC) 40 MG capsule, Take 40 mg by mouth daily., Disp: , Rfl:  .  propranolol ER  (INDERAL LA) 60 MG 24 hr capsule, Take 1 capsule (60 mg total) by mouth daily., Disp: 90 capsule, Rfl: 3 .  spironolactone (ALDACTONE) 50 MG tablet, TAKE 1 TABLET BY MOUTH TWICE A DAY, Disp: 60 tablet, Rfl: 1 .  topiramate (TOPAMAX) 25 MG tablet, Take 50 mg by mouth daily. 50MG  ONCE DAILY, Disp: , Rfl:   Depression screen [facility] 2/9 10/21/2018 09/30/2018 05/06/2018  Decreased Interest 0 0 0  Down, Depressed, Hopeless 0 0 0  PHQ - 2 Score 0 0 0  Altered sleeping 0 0 0  Tired, decreased energy 0 0 0  Change in appetite 0 0 0  Feeling bad or failure about yourself  0 0 0  Trouble concentrating 0 2 0  Moving slowly or fidgety/restless 2 0 0  Suicidal thoughts 0 0 0  PHQ-9 Score 2 2 0  Difficult doing work/chores Somewhat difficult Somewhat difficult -    GAD 7 : Generalized Anxiety Score 10/21/2018 09/30/2018 04/17/2018 09/07/2016  Nervous, Anxious, on Edge 2 0 0 3  Control/stop worrying 2 1 0 3  Worry too much - different things 2 2 0 3  Trouble relaxing 2 0 0 1  Restless 3 0 0 0  Easily annoyed or irritable 3 3 0 3  Afraid - awful might happen 1 3 0 3  Total GAD 7 Score 15 9 0 16  Anxiety Difficulty Extremely difficult Not difficult at all Not difficult at all Somewhat difficult    -------------------------------------------------------------------------- O: No physical exam performed due to remote telephone encounter.  Lab results reviewed.  Recent Labs    02/10/18 0838 10/14/18 1125  HGBA1C 6.0* 5.8*     Recent Results (from the past 2160 hour(s))  DHEA-sulfate     Status: None   Collection Time: 07/23/18 11:39 AM  Result Value Ref Range   DHEA-SO4 267.2 84.8 - 875.6 ug/dL  Follicle stimulating hormone     Status: None   Collection Time: 07/23/18 11:39 AM  Result Value Ref Range   FSH 7.9 mIU/mL    Comment:                     Adult Female:                       Follicular phase      3.5 -  12.5                       Ovulation phase       4.7 -  21.5                        Luteal phase          1.7 -   7.7                       Postmenopausal       25.8 - 134.8   Insulin, random     Status: Abnormal   Collection Time: 07/23/18 11:39  AM  Result Value Ref Range   INSULIN 44.7 (H) 2.6 - 24.9 uIU/mL  Progesterone     Status: None   Collection Time: 07/23/18 11:39 AM  Result Value Ref Range   Progesterone 0.2 ng/mL    Comment:                      Follicular phase       0.1 -   0.9                      Luteal phase           1.8 -  23.9                      Ovulation phase        0.1 -  12.0                      Pregnant                         First trimester    11.0 -  44.3                         Second trimester   25.4 -  83.3                         Third trimester    58.7 - 214.0                      Postmenopausal         0.0 -   0.1   Prolactin     Status: None   Collection Time: 07/23/18 11:39 AM  Result Value Ref Range   Prolactin 6.5 4.8 - 23.3 ng/mL  Testosterone, Free, Total, SHBG     Status: Abnormal   Collection Time: 07/23/18 11:39 AM  Result Value Ref Range   Testosterone 55 (H) 8 - 48 ng/dL   Testosterone, Free 3.0 0.0 - 4.2 pg/mL   Sex Hormone Binding 16.5 (L) 24.6 - 122.0 nmol/L  Rapid Influenza A&B Antigens ([facility] only)     Status: None   Collection Time: 08/29/18  3:50 PM  Result Value Ref Range   Influenza A ([facility]) NEGATIVE NEGATIVE   Influenza B ([facility]) NEGATIVE NEGATIVE    Comment: Performed at [facility], [address] [place], [place] [address]  Rapid Strep Screen ([facility] ONLY)     Status: None   Collection Time: 08/29/18  3:50 PM  Result Value Ref Range   Streptococcus, Group A Screen (Direct) NEGATIVE NEGATIVE    Comment: (NOTE) A Rapid Antigen test may result negative if the antigen level in the sample is below the detection level of this test. The FDA has not cleared this test as a stand-alone test therefore the rapid antigen negative result has reflexed to a Group A Strep culture. Performed  at [facility], [address] [place], [place] [address]   Culture, group A strep     Status: None   Collection Time: 08/29/18  3:50 PM  Result Value Ref Range   Specimen Description      THROAT Performed at [facility], [address] [place], [place] [address]    Special Requests  NONE Reflexed from [phone] Performed at [facility], [address] [place], [place] [address]    Culture      NO GROUP A STREP (S.PYOGENES) ISOLATED Performed at [facility], [place] [address] [place], [place] [address]    Report Status 09/01/2018 FINAL   TSH     Status: None   Collection Time: 10/14/18 11:25 AM  Result Value Ref Range   TSH 2.59 mIU/L    Comment:           Reference Range .           > or = 20 Years  0.40-4.50 .                Pregnancy Ranges           First trimester    0.26-2.66           Second trimester   0.55-2.73           Third trimester    0.43-2.91   VITAMIN D 25 Hydroxy (Vit-D Deficiency, Fractures)     Status: Abnormal   Collection Time: 10/14/18 11:25 AM  Result Value Ref Range   Vit D, 25-Hydroxy 26 (L) 30 - 100 ng/mL    Comment: Vitamin D Status         25-OH Vitamin D: . Deficiency:                    <20 ng/mL Insufficiency:             20 - 29 ng/mL Optimal:                 > or = 30 ng/mL . For 25-OH Vitamin D testing on patients on  D2-supplementation and patients for whom quantitation  of D2 and D3 fractions is required, the QuestAssureD(TM) 25-OH VIT D, (D2,D3), LC/MS/MS is recommended: order  code [phone] (patients >69yrs). . For more information on this test, go to: http://education.questdiagnostics.com/faq/FAQ163 (This link is being provided for  informational/educational purposes only.)   Lipid panel     Status: Abnormal   Collection Time: 10/14/18 11:25 AM  Result Value Ref Range   Cholesterol 141 <200 mg/dL   HDL 27 (L) > OR = 50 mg/dL   Triglycerides 118 <150 mg/dL   LDL Cholesterol  (Calc) 93 mg/dL (calc)    Comment: Reference range: <100 . Desirable range <100 mg/dL for primary prevention;   <70 mg/dL for patients with CHD or diabetic patients  with > or = 2 CHD risk factors. Marland Kitchen LDL-C is now calculated using the Martin-Hopkins  calculation, which is a validated novel method providing  better accuracy than the Friedewald equation in the  estimation of LDL-C.  Cresenciano Genre et al. Annamaria Helling. 2671;245(80): 2061-2068  (http://education.Quest[facility].com/faq/FAQ164)    Total CHOL/HDL Ratio 5.2 (H) <5.0 (calc)   Non-HDL Cholesterol (Calc) 114 <130 mg/dL (calc)    Comment: For patients with diabetes plus 1 major ASCVD risk  factor, treating to a non-HDL-C goal of <100 mg/dL  (LDL-C of <70 mg/dL) is considered a therapeutic  option.   COMPLETE METABOLIC PANEL WITH GFR     Status: Abnormal   Collection Time: 10/14/18 11:25 AM  Result Value Ref Range   Glucose, Bld 107 (H) 65 - 99 mg/dL    Comment: .            Fasting reference interval . For someone without known diabetes, a glucose value between 100  and 125 mg/dL is consistent with prediabetes and should be confirmed with a follow-up test. .    BUN 21 7 - 25 mg/dL   Creat 0.91 0.50 - 1.10 mg/dL   GFR, Est Non African American 85 > OR = 60 mL/min/1.31m2   GFR, Est African American 98 > OR = 60 mL/min/1.68m2   BUN/Creatinine Ratio NOT APPLICABLE 6 - 22 (calc)   Sodium 139 135 - 146 mmol/L   Potassium 4.5 3.5 - 5.3 mmol/L   Chloride 105 98 - 110 mmol/L   CO2 25 20 - 32 mmol/L   Calcium 9.7 8.6 - 10.2 mg/dL   Total Protein 7.0 6.1 - 8.1 g/dL   Albumin 4.4 3.6 - 5.1 g/dL   Globulin 2.6 1.9 - 3.7 g/dL (calc)   AG Ratio 1.7 1.0 - 2.5 (calc)   Total Bilirubin 0.3 0.2 - 1.2 mg/dL   Alkaline phosphatase (APISO) 50 31 - 125 U/L   AST 17 10 - 30 U/L   ALT 20 6 - 29 U/L  CBC with Differential/Platelet     Status: Abnormal   Collection Time: 10/14/18 11:25 AM  Result Value Ref Range   WBC 8.6 3.8 - 10.8 Thousand/uL    RBC 5.11 (H) 3.80 - 5.10 Million/uL   Hemoglobin 14.2 11.7 - 15.5 g/dL   HCT 41.9 35.0 - 45.0 %   MCV 82.0 80.0 - 100.0 fL   MCH 27.8 27.0 - 33.0 pg   MCHC 33.9 32.0 - 36.0 g/dL   RDW 14.2 11.0 - 15.0 %   Platelets 432 (H) 140 - 400 Thousand/uL   MPV 9.8 7.5 - 12.5 fL   Neutro Abs 5,280 1,500 - 7,800 cells/uL   Lymphs Abs 2,288 850 - 3,900 cells/uL   Absolute Monocytes 783 200 - 950 cells/uL   Eosinophils Absolute 198 15 - 500 cells/uL   Basophils Absolute 52 0 - 200 cells/uL   Neutrophils Relative % 61.4 %   Total Lymphocyte 26.6 %   Monocytes Relative 9.1 %   Eosinophils Relative 2.3 %   Basophils Relative 0.6 %  Hemoglobin A1c     Status: Abnormal   Collection Time: 10/14/18 11:25 AM  Result Value Ref Range   Hgb A1c MFr Bld 5.8 (H) <5.7 % of total Hgb    Comment: For someone without known diabetes, a hemoglobin  A1c value between 5.7% and 6.4% is consistent with prediabetes and should be confirmed with a  follow-up test. . For someone with known diabetes, a value <7% indicates that their diabetes is well controlled. A1c targets should be individualized based on duration of diabetes, age, comorbid conditions, and other considerations. . This assay result is consistent with an increased risk of diabetes. . Currently, no consensus exists regarding use of hemoglobin A1c for diagnosis of diabetes for children. .    Mean Plasma Glucose 120 (calc)   eAG (mmol/L) 6.6 (calc)  Urine Culture     Status: None   Collection Time: 10/14/18 11:25 AM  Result Value Ref Range   MICRO NUMBER: [id number]    SPECIMEN QUALITY: Adequate    Sample Source URINE, CLEAN CATCH    STATUS: FINAL    Result:      Three or more organisms present, each greater than 10,000 CFU/mL. May represent normal flora contamination from external genitalia. No further testing is required.  T4, free     Status: None   Collection Time: 10/14/18 11:25 AM  Result Value Ref Range   Free  T4 1.0 0.8 - 1.8 ng/dL   POCT Urinalysis Dipstick     Status: Normal   Collection Time: 10/14/18  2:13 PM  Result Value Ref Range   Color, UA amber    Clarity, UA clear    Glucose, UA Negative Negative   Bilirubin, UA negative    Ketones, UA negative    Spec Grav, UA 1.010 1.010 - 1.025   Blood, UA negative    pH, UA 5.0 5.0 - 8.0   Protein, UA Negative Negative   Urobilinogen, UA 0.2 0.2 or 1.0 E.U./dL   Nitrite, UA negative    Leukocytes, UA Negative Negative   Appearance clear    Odor      -------------------------------------------------------------------------- A&P:  Problem List Items Addressed This Visit    Elevated hemoglobin A1c - Primary    Improving A1c from 6 down to 5.8, with wt loss and lifestyle improve Off medication - failed metformin XR 500 intolerance GI Concern with morbid obesity, HTN, HLD  Plan:  1. Not on any therapy currently  2. Encourage improved lifestyle - low carb, low sugar diet, reduce portion size, continue improving regular exercise 3. Follow-up 6 months as anticipated for labs / a1c       Hypertension    Controlled HTN on medications for other conditions, improved w/ recent weight loss Likely anticipate further wt loss surgery would result in even lower BP - Home BP readings pending  No known complications     Plan:  1. Continue current BP regimen - Propranolol 60mg  daily (for PAC PVCs), Spironolactone 50mg  daily ( for PCOS) 2. Encourage improved lifestyle - low sodium diet, regular exercise 3. Start monitor BP outside office, bring readings to next visit, if persistently >140/90 or new symptoms notify office sooner 4. Follow-up BP readings in future - especially if after wt loss surgery and if symptomatic, advised her may warrant lowering dose of Spiro or Propranolol if needed       Hypothyroidism    [place], Controlled hypothyroidism on levothyroxine Last TSH / Free T4 normal 09/2018  Plan: 1. Continue current dose Levothyroxine from 72mcg daily 2.  Follow-up next lab in 6 months approx      Morbid (severe) obesity due to excess calories ([place])    Followed by [facility], Jolaine Click NP Improving wt loss still non surgical - but she is scheduled in future for evaluation for Gastric Sleeve Surgery (elective) if proceeds in near future, now on hold due to coronavirus restrictions on elective surgery  May need to reduce BP medication after wt loss surgery         No orders of the defined types were placed in this encounter.   Follow-up: - Return in 6 months for Annual Physical vs 6 mo follow-up - Future labs deferred for now - pending other lab testing at that time, we can order when ready, would be 6 months soonest for A1c and TSH.  Patient verbalizes understanding with the above medical recommendations including the limitation of remote medical advice.  Specific follow-up and call-back criteria were given for patient to follow-up or seek medical care more urgently if needed.  - Time spent in direct consultation with patient on phone: 11 minutes   Nobie Putnam, [facility] 10/21/2018, 9:38 AM

## 2018-10-21 NOTE — Assessment & Plan Note (Signed)
Controlled HTN on medications for other conditions, improved w/ recent weight loss Likely anticipate further wt loss surgery would result in even lower BP - Home BP readings pending  No known complications     Plan:  1. Continue current BP regimen - Propranolol 60mg  daily (for PAC PVCs), Spironolactone 50mg  daily ( for PCOS) 2. Encourage improved lifestyle - low sodium diet, regular exercise 3. Start monitor BP outside office, bring readings to next visit, if persistently >140/90 or new symptoms notify office sooner 4. Follow-up BP readings in future - especially if after wt loss surgery and if symptomatic, advised her may warrant lowering dose of Spiro or Propranolol if needed

## 2018-10-21 NOTE — Assessment & Plan Note (Signed)
Improving A1c from 6 down to 5.8, with wt loss and lifestyle improve Off medication - failed metformin XR 500 intolerance GI Concern with morbid obesity, HTN, HLD  Plan:  1. Not on any therapy currently  2. Encourage improved lifestyle - low carb, low sugar diet, reduce portion size, continue improving regular exercise 3. Follow-up 6 months as anticipated for labs / a1c

## 2018-10-23 ENCOUNTER — Encounter: Payer: Self-pay | Admitting: Obstetrics and Gynecology

## 2018-10-30 NOTE — Unmapped (Signed)
Called pt, to notify Dr. Augustine Radar will need to meet her in person prior to sleeve gastrectomy.      Covid Risk Stratification is 11      Hiren Peplinski L. Kennett Symes MSN, FNP APRN-BC  [facility]  GI Surgery  Bariatric Surgery  10/30/2018  9:00 AM

## 2018-11-05 ENCOUNTER — Encounter: Payer: Self-pay | Admitting: Nurse Practitioner

## 2018-11-05 ENCOUNTER — Telehealth (INDEPENDENT_AMBULATORY_CARE_PROVIDER_SITE_OTHER): Payer: BLUE CROSS/BLUE SHIELD | Admitting: Nurse Practitioner

## 2018-11-05 ENCOUNTER — Other Ambulatory Visit: Payer: Self-pay

## 2018-11-05 VITALS — Ht 61.0 in | Wt 307.0 lb

## 2018-11-05 DIAGNOSIS — I493 Ventricular premature depolarization: Secondary | ICD-10-CM

## 2018-11-05 DIAGNOSIS — R0789 Other chest pain: Secondary | ICD-10-CM

## 2018-11-05 DIAGNOSIS — R002 Palpitations: Secondary | ICD-10-CM

## 2018-11-05 NOTE — Progress Notes (Signed)
Virtual Visit via Telephone Note   This visit type was conducted due to national recommendations for restrictions regarding the COVID-19 Pandemic (e.g. social distancing) in an effort to limit this patient's exposure and mitigate transmission in our community.  Due to her co-morbid illnesses, this patient is at least at moderate risk for complications without adequate follow up.  This format is felt to be most appropriate for this patient at this time.  The patient did not have access to video technology/had technical difficulties with video requiring transitioning to audio format only (telephone).  All issues noted in this document were discussed and addressed.  No physical exam could be performed with this format.  Please refer to the patient's chart for her  consent to telehealth for [facility]. Evaluation Performed:  Follow-up visit  This visit type was conducted due to national recommendations for restrictions regarding the COVID-19 Pandemic (e.g. social distancing).  This format is felt to be most appropriate for this patient at this time.  All issues noted in this document were discussed and addressed.  No physical exam was performed (except for noted visual exam findings with Video Visits).  Please refer to the patient's chart (MyChart message for video visits and phone note for telephone visits) for the patient's consent to telehealth for [facility]. _____________   Date:  11/05/2018   Patient ID:  Leah Osborne, DOB Aug 23, 1985, MRN [id number] Patient Location:  home Provider location:   office  Primary Care Provider:  Olin Hauser, DO Primary Cardiologist:  Ida Rogue, MD  Chief Complaint    33 y/o ? with a history of atypical chest pain, hypertension, hyperlipidemia, sleep apnea, palpitations with documented PVCs and short runs of atrial tachycardia, who presents for follow-up of palpitations.  Past Medical History    Past Medical History:    Diagnosis Date   Abnormal Pap smear of cervix    Arthritis    Arthritis, rheumatoid (HCC)    Bipolar 1 disorder (HCC)    Chest pain    Headache    MIGRAINES   Hyperlipidemia    Hypertension    Palpitations    a. 09/2017 Zio Monitor: predominantly RSR, avg HR 87, 11 A tach/SVT episodes, fastest 176 x 7 beats, longest 9 beats. Rare PAC's, occas PVC's, rare couplets/triplets.   PTSD (post-traumatic stress disorder)    PVC (premature ventricular contraction)    Social phobia    Past Surgical History:  Procedure Laterality Date   CHOLECYSTECTOMY     LAPAROSCOPIC TUBAL LIGATION Bilateral 09/19/2015   Procedure: LAPAROSCOPIC BILATERAL TUBAL BANDING;  Surgeon: Brayton Mars, MD;  Location: [facility] ORS;  Service: Gynecology;  Laterality: Bilateral;    Allergies  No Known Allergies  History of Present Illness    Leah Osborne is a 33 y.o. female who presents via Engineer, civil (consulting) for a telehealth visit today.  She does not have access to a video source and therefore, this visit was carried out over the phone.  As above, she has a history of atypical chest pain, hypertension, hyperlipidemia, palpitations, migraines, sleep apnea, and bipolar disorder.  In April 2019, she was evaluated for tachypalpitations with a ZIO monitor, which showed symptomatic sinus tachycardia, brief runs of SVT, and intermittent PVCs and episodes of ventricular bigeminy and trigeminy.  She was placed on metoprolol 25 mg twice daily and continued to have breakthrough palpitations.  We increased metoprolol to 37.5 mg twice daily in October 2019 and she subsequently increased to 50 mg  twice daily however, then she started noticing low heart rates on her heart rate monitor.  She was initially dropped back to 37.5 mg twice daily and then subsequently switched to propranolol 20 mg twice daily.  She was referred to electrophysiology and saw Dr. Caryl Comes in January of this year and propranolol was  increased to propranolol LA 60mg  daily.  It was felt that any low heart rates on her apple watch were more likely secondary to PVCs as Zio did not show any significant bradycardia.  Cardiac CT with calcium scoring was recommended at her last visit but never carried out b/c of Covid-19.  Since her last visit, she has been relatively stable.  She still experiences intermittent palpitations, usually isolated skipped beats, but w/o any predictable pattern.  Some days or weeks are simply better or worse than others.  She was notified last week by [facility] that she could schedule her preop visit for gastric sleeve.  She says that though she is looking forward to surgery, she did feel anxious after getting the call and noted palpitations.  This week has been much better.  She is now tentatively scheduled for surgery 6/3.  She occas notes chest wall tenderness but denies exertional c/p, dyspnea, pnd, orthopnea, n, v, dizziness, syncope, edema, weight gain, or early satiety.   The patient does not have symptoms concerning for COVID-19 infection (fever, chills, cough, or new shortness of breath).   Home Medications    Prior to Admission medications   Medication Sig Start Date End Date Taking? Authorizing Provider  Adalimumab (HUMIRA PEN [address]) Inject into the skin.    [provider]  celecoxib (CELEBREX) 200 MG capsule Take 200 mg by mouth 2 (two) times daily.    [provider]  Cyanocobalamin (NASCOBAL) 500 MCG/0.1ML SOLN 1 spray into 1 nare once a week 08/22/18   [provider]  Elagolix Sodium (ORILISSA) 150 MG TABS Take 150 mg by mouth daily. 08/27/18   Rubie Maid, MD  lamoTRIgine (LAMICTAL) 200 MG tablet Take 200 mg by mouth daily.    [provider]  levothyroxine (SYNTHROID, LEVOTHROID) 25 MCG tablet Take 1 tablet (25 mcg total) by mouth daily before breakfast. 05/08/18   Karamalegos, Devonne Doughty, DO  magnesium oxide (MAG-OX) 400 MG tablet Take 1 tablet (400 mg total) by  mouth daily. 07/10/18   Deboraha Sprang, MD  omeprazole (PRILOSEC) 40 MG capsule Take 40 mg by mouth daily.    [provider]  propranolol ER (INDERAL LA) 60 MG 24 hr capsule Take 1 capsule (60 mg total) by mouth daily. 07/10/18   Deboraha Sprang, MD  spironolactone (ALDACTONE) 50 MG tablet TAKE 1 TABLET BY MOUTH TWICE A DAY 09/29/18   Rubie Maid, MD  topiramate (TOPAMAX) 25 MG tablet Take 50 mg by mouth daily. 50MG  ONCE DAILY    [provider]    Review of Systems    Palpitations, chest wall tenderness, and anxiety as above.  All other systems reviewed and are otherwise negative except as noted above.  Physical Exam    Vital Signs:  Ht 5\' 1"  (1.549 m)    Wt (!) 307 lb (139.3 kg) Comment: weight taken on 11/04/2018   BMI 58.01 kg/m    Exam limited by phone visit.  NAD.  AAOx3.  Resp reg and unlabored.  Accessory Clinical Findings    Lab Results  Component Value Date   CREATININE 0.91 10/14/2018   BUN 21 10/14/2018   NA 139  10/14/2018   K 4.5 10/14/2018   CL 105 10/14/2018   CO2 25 10/14/2018   Lab Results  Component Value Date   WBC 8.6 10/14/2018   HGB 14.2 10/14/2018   HCT 41.9 10/14/2018   MCV 82.0 10/14/2018   PLT 432 (H) 10/14/2018     Assessment & Plan    1.  Palpitations/PVC's: Overall stable, though she continues to note occasional palpitations, esp w/ periods of stress.  No prolonged symptoms, presyncope, or syncope.  She is losing weight and is pending gastric sleeve @ [facility] in 2 wks.  Rec continuing  blocker throughout the perioperative period. We did discuss that her BP, which she does not routinely follow, may come down w/ weight loss, and thus require a change to her propranolol dose at some point in the future.  If she develops worsening palpitations in the future, we may need to consider addition of ccb or flecainide, as prev rec by EP.  2.  Obesity:  Pending gastric sleeve eval @ [facility]. Cont  blocker throughout perioperative period.  3.   Atypical/MSK chest pain: She attributes this to her RA. She has not seen rheumatology yet.  We again discussed the role that cardiac CT for Ca2+ scoring would play in risk stratification, given significant FH of CAD.  This was prev ordered but then put on hold in the setting of financial concerns and subsequently Covid-19.  She will reconsider scheduling in the future.  4.  Disposition:  F/u with Dr. Rockey Situ in 6 mos or sooner if necessary.  COVID-19 Education: The signs and symptoms of COVID-19 were discussed with the patient and how to seek care for testing (follow up with PCP or arrange E-visit).  The importance of social distancing was discussed today.  Patient Risk:   After full review of this patient's history and clinical status, I feel that he is at least moderate risk for cardiac complications at this time, thus necessitating a telehealth visit sooner than our first available in office visit.  Time:   Today, I have spent 15 minutes with the patient with telehealth technology discussing medical history, symptoms, and management plan.    Murray Hodgkins, NP 11/05/2018, 1:55 PM

## 2018-11-05 NOTE — Patient Instructions (Signed)
It was a pleasure to speak with you on the phone today! Thank you for allowing [place] to continue taking care of your [facility] needs during this time.   Feel free to call as needed for questions and concerns related to your cardiac needs.   Medication Instructions:  Your physician recommends that you continue on your current medications as directed. Please refer to the Current Medication list given to you today.   If you need a refill on your cardiac medications before your next appointment, please call your pharmacy.   Lab work: None ordered If you have labs (blood work) drawn today and your tests are completely normal, you will receive your results only by: Marland Kitchen MyChart Message (if you have MyChart) OR . A paper copy in the mail If you have any lab test that is abnormal or we need to change your treatment, we will call you to review the results.  Testing/Procedures: None ordered   Follow-Up: At [facility], you and your health needs are our priority.  As part of our continuing mission to provide you with exceptional heart care, we have created designated Provider Care Teams.  These Care Teams include your primary Cardiologist (physician) and Advanced Practice Providers (APPs -  Physician Assistants and Nurse Practitioners) who all work together to provide you with the care you need, when you need it. You will need a follow up virtual appointment in 6 months.  Please call our office 2 months in advance to schedule this appointment.  You may see Ida Rogue, MD or Murray Hodgkins, NP.

## 2018-11-05 NOTE — Unmapped (Signed)
LVM appt needs to be rescheduled on 11/19/2018. Call to schedule a video visit.

## 2018-11-11 ENCOUNTER — Ambulatory Visit: Admit: 2018-11-11 | Discharge: 2018-11-12 | Payer: MEDICAID | Attending: Surgery | Primary: Surgery

## 2018-11-11 DIAGNOSIS — Z6841 Body Mass Index (BMI) 40.0 and over, adult: Secondary | ICD-10-CM | POA: Diagnosis not present

## 2018-11-11 NOTE — Unmapped (Signed)
Patient Name: Jacqueline Hall  Medical Record Number: [id number]  Date of Service: 11/11/2018    Referring Provider: Ayesha Rumpf, NP.     Primary Provider: Smitty Cords, DO.    Chief Complaint: Morbid Obesity.    History of Present Illness: Jacqueline Hall is 33 y.o. female who was initially seen in the [facility] by Ayesha Rumpf and colleagues and who returns today for further discussions regarding her candidacy for laparoscopic bariatric surgery. [name] has detailed the patient's nonsurgical weight loss efforts as well as her medical comorbidities.    She underwent upper endoscopy on 07/17/2016, which was normal (no biopsies taken).    Past Medical History:  Past Medical History:   Diagnosis Date   ??? Abnormal Pap smear of cervix 2013    per patient; biopsy was done and then everything was fine   ??? Anemia 2013   ??? Ankylosing spondylitis (CMS-HCC)    ??? Arthritis 2018   ??? Bipolar 1 disorder (CMS-HCC) 2018   ??? Depression    ??? Disease of thyroid gland    ??? Elevated blood-pressure reading without diagnosis of hypertension    ??? Headache(784.0)     more than 2-3 times a week; takes Ibuprofen. could not afford prescription medication   ??? History of pre-eclampsia in prior pregnancy, currently pregnant 11/2011   ??? 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 Hypertension    ??? Menorrhagia    ??? Morbid obesity with BMI of 50.0-59.9, adult (CMS-HCC)    ??? Obesity    ??? PTSD (post-traumatic stress disorder) 2018   ??? Sleep apnea    Hemoglobin A1c 5.8 on 06/20/2018  Not currently anemic (improved since menstrual periods have stopped)  Taking Beta blocker for PAC's/PVC's  Not on CPAP, thinks sleep apnea is mild  Has mild heartburn symptoms, none on PPI    Past Surgical History:   Past Surgical History:   Procedure Laterality Date   ??? CHOLECYSTECTOMY  12/20/2007    Initially done L/S then had some sort of complication, required endoscopy   ??? PR UPPER GI ENDOSCOPY,DIAGNOSIS N/A 07/17/2016    Procedure: UGI ENDO, INCLUDE ESOPHAGUS, STOMACH, & DUODENUM &/OR JEJUNUM; DX W/WO COLLECTION SPECIMN, BY BRUSH OR WASH;  Surgeon: Annie Paras, MD;  [facility];  Service: Gastroenterology   ??? TUBAL LIGATION         Medications:   Current Outpatient Medications   Medication Sig Dispense Refill   ??? celecoxib (CELEBREX) 200 MG capsule Take 1 capsule (200 mg total) by mouth Two (2) times a day. 180 capsule 3   ??? cyanocobalamin, vitamin B-12, (NASCOBAL) 500 mcg/spray Spry 1 spray into 1 nare once a week 4 Bottle 11   ??? elagolix (ORILISSA) 150 mg Tab Take 150 mg by mouth daily.     ??? HUMIRA PEN CITRATE FREE 40 MG/0.4 ML INJECT 40 MG (0.4 ML) UNDER THE SKIN EVERY 14 DAYS 6 each 3   ??? lamoTRIgine (LAMICTAL) 200 MG tablet      ??? levothyroxine (SYNTHROID, LEVOTHROID) 25 MCG tablet Take 1.5 tablets (37.5 mcg total) by mouth daily. (Patient taking differently: Take 25 mcg by mouth daily. ) 90 tablet 3   ??? MAGOX 400 mg (241.3 mg magnesium) tablet      ??? MEDICAL SUPPLY ITEM Please provide patient with 2 left carpal tunnel syndrome braces for wrists. 2 Units 0   ??? omeprazole (PRILOSEC) 40 MG capsule Take 40 mg by mouth.     ???  propranoloL (INDERAL LA) 60 mg 24 hr capsule      ??? spironolactone (ALDACTONE) 50 MG tablet Take 50 mg by mouth Two (2) times a day.     ??? topiramate (TOPAMAX) 50 MG tablet Take 1 tablet (50 mg total) by mouth every evening. 90 tablet 3     No current facility-administered medications for this visit.        Allergies:  No Known Allergies    Social History:  Social History     Socioeconomic History   ??? Marital status: Married     Spouse name: None   ??? Number of children: 2   ??? Years of education: None   ??? Highest education level: None   Occupational History   ??? Occupation: Unemployed   Social Needs   ??? Financial resource strain: None   ??? Food insecurity     Worry: None     Inability: None   ??? Transportation needs     Medical: None     Non-medical: None   Tobacco Use   ??? Smoking status: Never Smoker   ??? Smokeless tobacco: Never Used   Substance and Sexual Activity   ??? Alcohol use: Never   ??? Drug use: Never   ??? Sexual activity: Yes     Partners: Male     Birth control/protection: Surgical   Lifestyle   ??? Physical activity     Days per week: None     Minutes per session: None   ??? Stress: None   Relationships   ??? Social Wellsite geologist on phone: None     Gets together: None     Attends religious service: None     Active member of club or organization: None     Attends meetings of clubs or organizations: None     Relationship status: None   Other Topics Concern   ??? Exercise No   ??? Living Situation Yes     Comment: ?   Social History Narrative    Married 2 children, 1 stepchild.    Previously worked at [employer] in [employer], currently (09/05/2016) unemployed.    Lives in [place].    Has tubal ligation.    The patient is married and lives in [place].  Works in Location manager.  She does not use tobacco, alcohol or other drugs.    Family History:   Family History   Problem Relation Age of Onset   ??? Diabetes Maternal Grandmother    ??? Hypertension Maternal Grandmother    ??? Heart disease Maternal Grandmother    ??? Dementia Maternal Grandmother         great grandmother   ??? Heart attack Maternal Grandmother    ??? Osteoporosis Maternal Grandmother         grandfathers mother   ??? Stroke Maternal Grandmother    ??? No Known Problems Mother    ??? Hypertension Father    ??? Hypertension Sister    ??? Hypertension Brother    ??? Heart disease Maternal Grandfather    ??? Atrial fibrillation Paternal Grandmother    ??? Heart failure Paternal Grandmother    ??? Hypothyroidism Maternal Aunt    ??? Migraines Maternal Aunt    ??? Multiple sclerosis Maternal Uncle    ??? Breast cancer Neg Hx    ??? Ovarian cancer Neg Hx    Mother just diagnosed with RA.    Review of systems:   A 10-system review of  systems was completed with the exception of those positives mentioned in history of present illness and past medical history is negative.      Physical examination:     BP 111/68 (BP Site: R Arm, BP Position: Sitting, BP Cuff Size: Large)  - Pulse 78  - Temp 36.7 ??C (98.1 ??F) (Oral)  - Ht 154.9 cm (5' 1)  - Wt (!) 137.7 kg (303 lb 8 oz)  - LMP 06/30/2018 (Approximate)  - SpO2 97%  - BMI 57.35 kg/m??     Wt Readings from Last 6 Encounters:   11/11/18 (!) 137.7 kg (303 lb 8 oz)   09/26/18 (!) 140.9 kg (310 lb 9.6 oz)   08/26/18 (!) 141.8 kg (312 lb 11.2 oz)   08/01/18 (!) 142.6 kg (314 lb 6.4 oz)   06/20/18 (!) 147.3 kg (324 lb 12.8 oz)   06/12/18 (!) 147.1 kg (324 lb 6.4 oz)       CONSTITUTIONAL: This is a well-appearing female in no acute distress, whose appearance is consistent with stated age.   EYES: Anicteric. Extraocular movements are grossly intact.   EAR, NOSE, MOUTH AND THROAT: Mucous membranes moist. Normal dentition.   HEART/CARDIOVASCULAR: Normal sinus rhythm.   CHEST/PULMONARY: Clear to auscultation. Normal work of breathing on room air.   ABDOMEN/GASTROINTESTINAL: Soft, nontender and nondistended without palpable organomegaly or mass.   SKIN: Warm and well perfused without cyanosis or edema.   NEUROLOGIC: Alert and oriented x3. Sensory and motor grossly intact.  LYMPHATICS: No palpable lymphadenopathy.     Lab Results   Component Value Date    WBC 12.3 (H) 06/20/2018    HGB 13.7 06/20/2018    HCT 41.7 06/20/2018    PLT 483 (H) 06/20/2018       Lab Results   Component Value Date    NA 138 06/20/2018    K 4.7 06/20/2018    CL 105 06/20/2018    CO2 24.0 06/20/2018    BUN 21 06/20/2018    CREATININE 0.77 06/20/2018    GLU 95 06/20/2018    CALCIUM 9.7 06/20/2018       Lab Results   Component Value Date    BILITOT 0.2 06/20/2018    PROT 6.7 06/20/2018    ALBUMIN 4.0 06/20/2018    ALT 22 06/20/2018    AST 25 06/20/2018    ALKPHOS 59 06/20/2018       No results found for: LABPROT, INR, APTT      Impression:  This is a 33 y.o. female with morbid obesity and related comorbidities, who desires surgical therapy; she does meet criteria for weight loss surgery. Plan:  Greater than 50 minutes was spent with the patient today, reviewing her history and counseling her regarding sleeve gastrectomy; the majority of that time (much more than half) was spent counseling the patient regarding sleeve gastrectomy as a treatment for morbid obesity, and discussing the indications for, as well as the risks and benefits of, this weight loss procedure.   ??  We discussed the changes that would be necessary following surgery including the provision of frequent small meals with a focus on protein intake. More specifically, I advised the patient that she will require approximately 60-80 grams of protein per day to maintain her muscle mass while losing weight. Furthermore, we discussed in detail what would compose a balanced post sleeve gastrectomy??diet. ??In addition, to achieve satisfactory weight loss, the patient will need to avoid high sugar and high fat foods and drinks. The  patient understands she may??need to take vitamins daily for the rest of her life, and certainly immediately following surgery and during the period of rapid weight loss. We discussed the phases of weight loss surgery after sleeve gastrectomy and the expected weight loss, approximately 60% of excess weight on average. We did discuss the more important goal of improving lean body mass versus fat mass and how it is lost would be more important then pounds lost for determining improved body composition.  ??  We discussed the phases of surgery including the initial phase of rapid weight loss and difficulty with oral intake and the possibility of regurgitation of certain kinds of foods. The patient understands she will need to drink at least 64 ounces of liquid per day including water and a protein supplement to total at least 60-80 g of protein. We discussed the second phase of weight loss with decreased appetite, frequently a decreased taste for certain kinds of foods, and possible long term nausea that can last for months. We discussed the third phase of recovery with return of appetite and taste for food as well as the ability to tolerate a wider variety of foods.  The patient understands that she can regain weight if she returns to old??habits of eating and physical activity. The patient understands maintaining weight will require good habits for eating and physical activity. We discussed the average weight lost with sleeve gastrectomy, which is 60% of excess weight on average, but she understands her weight loss could be more or less. We discussed outcomes in terms of improved function and lean body mass. The patient understands that weight alone may not be the best measure of success. Improved function and an increase of lean body mass are more important than pounds loss. A better measure of improved body composition is inches loss rather than pounds lost. The patient understands an increase in lean body mass is best achieved with combination of calorie control and regular physical activity.  ??  To achieve the best possible result, she should take part in a regular exercise program which should include aerobic exercise sufficient to raise her heart rate for 30 minutes at least 5 times per week with an eventual goal of at least 60 minutes of physical exercise per session of at least moderate intensity at least five days per week.   ??  We discussed the expectations for the day of surgery and the time immediately following surgery including anesthesia, incisions, and insufflation of the abdomen with CO2. We discussed the progression of diet from a small-volume non-carbonated, sugar-free, clear-liquid diet to full-liquid diet, to a pureed, a soft and finally a full diet. I explained that she will need to remain in the hospital at least one to two nights and possibly several days. We then talked about what could be expected during the remaining months and years following surgery, including anticipated weight loss. It was explained that close follow-up would be required with visits required at 3 weeks, 3 months, 6 months, 1 year and annually thereafter.   ??  The risks of surgery were discussed. These included but were not limited to death, immediate or delayed staple-line leak, DVT, pulmonary embolus, bleeding requiring transfusion or second operation, injury to intra-abdominal organs including bowel, liver, spleen or pancreas, and anesthesia-related complications. We discussed the different surgical approaches including open, robotic??and laparoscopic approaches, complications that could be anticipated with the each, vitamin insufficiency, reflux, stricture, and lack of achievement of expected weight loss.   ??  I made it clear that the procedure is not considered reversible, and that the significant change in eating habits that necessarily accompany the sleeve gastrectomy may significantly impact her postoperative lifestyle and relationship to food. Finally, I made sure that the patient understood that this is not considered cosmetic surgery, and that there can be significant laxity of the skin overlying areas of excess weight when expected weight loss is achieved.      We discussed the possibility of increased fertility during and after rapid weight loss for women still able to bear children, and our recommendation to avoid unplanned pregnancy for 18-24 months after bariatric surgery or after her weight stabilizes.  Has had tubal ligation so should not be able to become pregnant.  ??  At this point, the patient had all of her questions regarding postoperative recovery and follow up answered, and seemed willing to comply.   ??  With all of the patient's questions answered to her satisfaction, and understanding the risks, the patient wished to proceed. It was clear to me that the patient had an excellent grasp of the risks and benefits of the procedure, as well as reasonable expectations with regard to the result. I feel confident that her understanding is adequate to recommend that we proceed with surgery. After the extensive review of risks and benefits outlined above, the consent form for the procedure was reviewed in detail with the patient and the form was signed reflecting her desire to proceed with robotic sleeve gastrectomy.     Her endoscopy showed no evidence of active H. pylori infection.  We discussed antibody versus antigen testing prior to surgery versus pathological examination after surgery.  The patient is likely low risk for H. pylori infection, so I am willing to proceed with surgery and pathological examination after surgery.    Berton Mount, MD  11/11/2018  11:29 AM

## 2018-11-11 NOTE — Unmapped (Signed)
Patient is here today for a preop visit. She is scheduled for robotic vertical sleeve gastrectomy on 11/19/2018.

## 2018-11-12 DIAGNOSIS — F3181 Bipolar II disorder: Secondary | ICD-10-CM | POA: Diagnosis not present

## 2018-11-13 NOTE — Unmapped (Signed)
LVM appt on 6/3 needs to be changed & to call and schedule a video visit with Dr.Pearlstein

## 2018-11-13 NOTE — Unmapped (Signed)
Patient was called to schedule Pre-Test for <11-17-18>    Did patient confirm appointment? Yes    COVID PRE TEST needed. The procedure is on 6-3 at 0830am. Sp/w pt sched appt for Mon 6-1 at 12pm at [facility] location. Sent MyChart msg with appt date, time, location, address & call back#

## 2018-11-17 ENCOUNTER — Ambulatory Visit: Admit: 2018-11-17 | Discharge: 2018-11-18 | Payer: MEDICAID | Attending: Family | Primary: Family

## 2018-11-17 DIAGNOSIS — Z1159 Encounter for screening for other viral diseases: Principal | ICD-10-CM

## 2018-11-17 NOTE — Unmapped (Signed)
COVID Pre-Procedure Intake Form     Assessment     Jacqueline Hall is a 33 y.o. female presenting to [facility] for COVID testing prior to procedure .     Plan     COVID swab obtained.  Reviewed with patient importance of staying home until procedure and limiting exposure.      Subjective     Jacqueline Hall is a 33 y.o. female who [facility] with complaints of the following:       Are you scheduled to have surgery in the next 3 days?  Yes   Are you scheduled to have a procedure like a colonoscopy or endoscopy in the next 3 days?  No       Have you ever been tested for COVID-19 with a swab of your nose?    No           In the last 14 days?     Have you traveled outside of [place]? No   Have you been in close contact with someone confirmed by a test to have COVID? (Close contact is within 6 feet for at least 10 minutes) No         Worked in a health care facility?   No           Symptoms (as reported by patient):  Do you currently have any of the following:  Subjective fever (felt feverish) No   Chills (especially repeated shaking chills) No   Severe fatigue (felt very tired)  No   Muscle aches No   Runny nose No   Sore Throat No   Loss of taste or smell No   Cough (new onset or worsening of chronic cough) No   Shortness of breath No   Nausea or vomiting No   Headache No   Abdominal pain No   Diarrhea (3 or more loose stools in last 24 hours) No     Objective     Testing Performed:  Test Specimen Type Sent to   COVID-19  NP Swab [facility]       Scribe's Attestation: Colin Rhein, FNP obtained and performed the history, physical exam and medical decision making elements that were entered into the chart.  Signed by Selinda Eon, PT serving as Scribe, on 11/17/2018 1:42 PM

## 2018-11-18 NOTE — Unmapped (Signed)
Pre-op screen for COVID testing negative. Result available via Epic for surgery or specialty team.

## 2018-11-19 ENCOUNTER — Ambulatory Visit
Admit: 2018-11-19 | Discharge: 2018-11-20 | Disposition: A | Discharge status: Home / Self Care | Payer: MEDICAID | Admitting: Surgery

## 2018-11-19 ENCOUNTER — Encounter
Admit: 2018-11-19 | Discharge: 2018-11-20 | Disposition: A | Discharge status: Home / Self Care | Payer: MEDICAID | Attending: Certified Registered" | Admitting: Surgery

## 2018-11-19 DIAGNOSIS — Z9049 Acquired absence of other specified parts of digestive tract: Secondary | ICD-10-CM | POA: Diagnosis not present

## 2018-11-19 DIAGNOSIS — I493 Ventricular premature depolarization: Secondary | ICD-10-CM | POA: Diagnosis not present

## 2018-11-19 DIAGNOSIS — K3189 Other diseases of stomach and duodenum: Secondary | ICD-10-CM | POA: Diagnosis not present

## 2018-11-19 DIAGNOSIS — I10 Essential (primary) hypertension: Secondary | ICD-10-CM | POA: Diagnosis not present

## 2018-11-19 DIAGNOSIS — Z6841 Body Mass Index (BMI) 40.0 and over, adult: Secondary | ICD-10-CM | POA: Diagnosis not present

## 2018-11-19 DIAGNOSIS — F4310 Post-traumatic stress disorder, unspecified: Secondary | ICD-10-CM | POA: Diagnosis not present

## 2018-11-19 DIAGNOSIS — Z9884 Bariatric surgery status: Secondary | ICD-10-CM | POA: Diagnosis not present

## 2018-11-19 DIAGNOSIS — M1990 Unspecified osteoarthritis, unspecified site: Secondary | ICD-10-CM | POA: Diagnosis not present

## 2018-11-19 DIAGNOSIS — F319 Bipolar disorder, unspecified: Secondary | ICD-10-CM | POA: Diagnosis not present

## 2018-11-19 DIAGNOSIS — G4733 Obstructive sleep apnea (adult) (pediatric): Secondary | ICD-10-CM | POA: Diagnosis not present

## 2018-11-19 DIAGNOSIS — E079 Disorder of thyroid, unspecified: Secondary | ICD-10-CM | POA: Diagnosis not present

## 2018-11-19 HISTORY — PX: LAPAROSCOPIC GASTRIC SLEEVE RESECTION: SHX5895

## 2018-11-19 NOTE — Unmapped (Signed)
Robotic Sleeve Gastrectomy Procedure Note     Pre-operative Diagnosis:   Morbid obesity.    Post-operative Diagnosis:   Morbid obesity.    Procedure (s) Performed: Robotic Sleeve Gastrectomy, difficult.     Teaching Surgeon:  D. Remigio Eisenmenger, MD.    Resident Surgeon:  Imagene Riches, MD (partial case).     Assistant (s):   Durward Mallard, PA-C.     Due to the complexity of this case, Durward Mallard, PA-C was present to assist with the Robotic Sleeve Gastrectomy, difficult.          The following exceptional medical circumstances contributed to the complexity of this case:  The PAs assistance was required because no qualified resident was available to assist with the case. The PA assisted with patient positioning and preparation, and operated at bedside while I operated at the console, to include placement and removal of instruments, suctioning, and operating the linear stapler.     Anesthesia:  General endotracheal anesthesia.    Anesthesiologist:  Alba Cory, MD    Specimens: Sleeve Gastrectomy.     Disposition of Specimens:  Pathology.          Drains: None    Cultures:  None.    Estimated Blood Loss:  25 mL.    Total IV Fluids:  1400 mL crystalloid.           Urine output:  Not recorded.         Complications:  None; patient tolerated the procedure well.    Indications:  Jacqueline Hall has morbid obesity and has failed medical therapy for the excess weight and desires surgical therapy. The risks, benefits and alternatives to treatment were discussed with the patient and understanding these, the patient wished to proceed.     Operative Findings:  There were findings consistent with morbid obesity.  There was significant central obesity with more than usual intra-abdominal fat, which made procedure more difficult.    Procedure Details:   The patient was positively identified and consent was verified in the holding area. The patient was brought to the operating room and placed in supine position on the operating room table, being careful to pad all pressure points and secure the patient safely to the table. Heparin 5000 units was given subcutaneously for DVT prophylaxis. Sequential compression devices were placed for DVT prophylaxis. Kefzol 3 grams IV was given for antibiotic prophylaxis. A timeout was called prior to induction of anesthesia to verify the patient's identification, the patient was confirmed as Jacqueline Hall, the intended procedure and the patient's allergies. General endotracheal anesthesia was induced. An orogastric tube was placed for decompression of the stomach.  The abdomen was prepped and draped in the usual sterile fashion, including removal of hair as needed with an electronic clipper. A second timeout was called to verify the patient's identification, the intended procedure and the patient's allergies.    All intended incisions were infiltrated with with local anesthesia. Access to the abdomen was gained using the Veress technique. A right upper quadrant stab incision was made at the costal margin in the midclavicular line with an 11-blade. The Veress needle was passed into the peritoneal cavity. After performing a hanging drop test, the abdomen was insufflated via the Veress needle to 15 mmHg with carbon dioxide. A 5 mm trocar was placed at the site of successful Veress entry. A 5 mm 30-degree video laparoscope was placed through this port. The abdomen was inspected. There was no  evidence of injury from gaining abdominal access.  All remaining trocars were placed under direct vision.  A12 mm trocar was placed in the left mid abdomen in the midclavicular line for the camera.  A 15 mm trocar was placed in the right mid abdomen for the stapler and for the robot 8 mm port and robot left hand. An 8 mm trocar was placed in the left mid abdomen in the anterior axillary line for the robot right hand. The initial 5 mm trocar at the right costal margin in the anterior axillary line was used for the liver retractor, which was placed through this incision under direct vision; the liver was elevated and retractor was secured to the operating room table. The robot was docked and Dr. Augustine Radar scrubbed out to work at the console while Durward Mallard, PA-C assisted at the beside.     We turned our attention to the upper abdomen. The liver was elevated to reveal a normal stomach and normal upper abdominal anatomy.      There were findings consistent with morbid obesity.  There was significant central obesity with more than usual intra-abdominal fat, which made procedure more difficult.  The pylorus was identified. We measured 6 cm proximal to the pylorus. This was marked and would be the point at which we would initiate the gastric staple line for the sleeve gastrectomy. Using a robotic vessel sealer, the vessels along the greater curvature were divided sequentially from 6 cm proximal to the pylorus up to the angle of His, inclusive of all short gastric vessels and posterior gastric attachments.     The orogastric tube was removed.  It was confirmed that the orogastric tube had been completely removed from the stomach and esophagus.  A 36 French dilator was passed through the esophagus and into the antrum of the stomach by the Anesthesia team while we observed passage under direct laparoscopic vision.  From the 15 mm trocar site, we initiated gastric transection 6 cm proximal to the pylorus using the Covidien 60 mm stapler with black load, angling the stapler so that approximately a 50 French bougie distance was maintained from the angularis incisura.  We continued transection of the stomach from the 15 mm trocar using purple loads of the Covidien 60 mm stapler staying close to the 36 [place] dilator. After moving superior to the angularis, the staple line was designed to leave an adequate margin to the intraluminal 36 French dilator and leave the angle of His at the proximal stomach and the gastroesophageal junction and intact.      After completion of the gastric transection, the 48 French dilator was removed under laparoscopic vision. Hemostasis was ensured.  The robot was undocked. Dr. Augustine Radar scrubbed back in.  The 15 mm port was removed and the sleeve gastrectomy was removed from the mid abdominal 15 mm port site and passed off the field as specimen. The port and video laparoscope were replaced. Hemostasis was ensured. The liver retractor was removed under direct laparoscopic vision.       The 12 and 15 mm ports were sequentially removed and 0 Vicryl suture was placed through the fascia of the port sites sequentially and after placing both sutures, tied, closing the 12 and 15 mm ports sites at the fascial level.  Exparel 20 mL was expanded to 40 mL with injectable saline, and placed at each incision, and in the transversus abdominis plane creating a regional TAP block.  The upper abdominal laparoscopic ports were removed under  direct vision. The abdomen was desufflated.    ??  The skin of all incisions was closed with running subcuticular 4-0 Monocryl and dressed with Dermabond. The patient was awakened and taken from the operating room to the recovery room in stable condition.      Condition: stable    Disposition: PACU - hemodynamically stable.    Post-op plan/Instructions: The patient will be admitted for supportive care.     Teaching Surgeon Attestation:  I, Colon Branch, M.D., was present for the entire procedure from preprocedure timeout to closure of the skin incisions.     Berton Mount, MD  11/19/2018  12:00 PM

## 2018-11-19 NOTE — Unmapped (Signed)
Brief Operative Note  (CSN: [id number])      Date of Surgery: 11/19/2018    Pre-op Diagnosis: morbid obesity    Post-op Diagnosis: Morbid obesity with BMI of 50.0-59.9, adult (CMS-HCC) [E66.01, Z68.43]    Procedure(s):  R25 ROBOTIC PR LAP, GAST RESTRICT PROC, LONGITUDINAL GASTRECTOMY: 43775 (CPT??)  Note: Revisions to procedures should be made in chart - see Procedures activity.    Performing Service: General Surgery  Surgeon(s) and Role:     * Colon Branch, MD - Primary     * Imagene Riches, MD - Resident - Assisting    Assistant: Physician Assistant: Netty Starring, PA    Findings: Robotic sleeve gastrectomy. All ports closed. Bilateral TAP blocks and port sites injected with exparel. Skin closed with 4.0 monocryl. Exofin applied.    Anesthesia: General    Estimated Blood Loss: 25 mL    Complications: None    Specimens:   ID Type Source Tests Collected by Time Destination   1 : Sleeve Gastrectomy Tissue Stomach SURGICAL PATHOLOGY EXAM Colon Branch, MD 11/19/2018 [phone]        Implants: * No implants in log *    Surgeon Notes: I was present and scrubbed for the entire procedure    Imagene Riches   Date: 11/19/2018  Time: 12:00 PM

## 2018-11-20 DIAGNOSIS — Z9884 Bariatric surgery status: Secondary | ICD-10-CM | POA: Diagnosis not present

## 2018-11-20 LAB — COMPREHENSIVE METABOLIC PANEL
ALBUMIN: 3.7 g/dL (ref 3.5–5.0)
ALKALINE PHOSPHATASE: 44 U/L (ref 38–126)
ALT (SGPT): 168 U/L — ABNORMAL HIGH (ref <35)
ANION GAP: 9 mmol/L (ref 7–15)
AST (SGOT): 137 U/L — ABNORMAL HIGH (ref 14–38)
BILIRUBIN TOTAL: 0.4 mg/dL (ref 0.0–1.2)
BLOOD UREA NITROGEN: 13 mg/dL (ref 7–21)
BUN / CREAT RATIO: 14
CALCIUM: 9.1 mg/dL (ref 8.5–10.2)
CHLORIDE: 106 mmol/L (ref 98–107)
CO2: 26 mmol/L (ref 22.0–30.0)
EGFR CKD-EPI AA FEMALE: >90 mL/min/{1.73_m2} (ref >=60)
EGFR CKD-EPI NON-AA FEMALE: 84 mL/min/{1.73_m2} (ref >=60)
GLUCOSE RANDOM: 90 mg/dL (ref 70–179)
POTASSIUM: 4.2 mmol/L (ref 3.5–5.0)
PROTEIN TOTAL: 6 g/dL — ABNORMAL LOW (ref 6.5–8.3)
SODIUM: 141 mmol/L (ref 135–145)

## 2018-11-20 LAB — CBC W/ AUTO DIFF
BASOPHILS ABSOLUTE COUNT: 0 10*9/L (ref 0.0–0.1)
EOSINOPHILS ABSOLUTE COUNT: 0.1 10*9/L (ref 0.0–0.4)
EOSINOPHILS RELATIVE PERCENT: 1.1 %
HEMATOCRIT: 39.9 % (ref 36.0–46.0)
HEMOGLOBIN: 13.3 g/dL — ABNORMAL LOW (ref 13.5–16.0)
LARGE UNSTAINED CELLS: 3 % (ref 0–4)
LYMPHOCYTES ABSOLUTE COUNT: 2.4 10*9/L (ref 1.5–5.0)
LYMPHOCYTES RELATIVE PERCENT: 21.7 %
MEAN CORPUSCULAR HEMOGLOBIN CONC: 33.3 g/dL (ref 31.0–37.0)
MEAN CORPUSCULAR VOLUME: 86 fL (ref 80.0–100.0)
MEAN PLATELET VOLUME: 6.6 fL — ABNORMAL LOW (ref 7.0–10.0)
MONOCYTES ABSOLUTE COUNT: 0.9 10*9/L — ABNORMAL HIGH (ref 0.2–0.8)
MONOCYTES RELATIVE PERCENT: 7.9 %
NEUTROPHILS RELATIVE PERCENT: 66.2 %
PLATELET COUNT: 317 10*9/L (ref 150–440)
RED BLOOD CELL COUNT: 4.64 10*12/L (ref 4.00–5.20)
RED CELL DISTRIBUTION WIDTH: 15.1 % — ABNORMAL HIGH (ref 12.0–15.0)
WBC ADJUSTED: 11.2 10*9/L — ABNORMAL HIGH (ref 4.5–11.0)

## 2018-11-20 LAB — MEAN PLATELET VOLUME: Lab: 6.6 — ABNORMAL LOW

## 2018-11-20 LAB — PHOSPHORUS: Phosphate:MCnc:Pt:Ser/Plas:Qn:: 3.3

## 2018-11-20 LAB — GLUCOSE RANDOM: Glucose:MCnc:Pt:Ser/Plas:Qn:: 90

## 2018-11-20 LAB — MAGNESIUM: Magnesium:MCnc:Pt:Ser/Plas:Qn:: 1.9

## 2018-11-20 MED ORDER — ONDANSETRON HCL 4 MG/2ML IJ SOLN
4.00 | INTRAMUSCULAR | Status: DC
Start: ? — End: 2018-11-20

## 2018-11-20 MED ORDER — Medication
Status: DC
Start: ? — End: 2018-11-20

## 2018-11-20 MED ORDER — DEXTROSE 50 % IV SOLN
12.50 | INTRAVENOUS | Status: DC
Start: ? — End: 2018-11-20

## 2018-11-20 MED ORDER — BISACODYL 10 MG RE SUPP
10.00 | RECTAL | Status: DC
Start: ? — End: 2018-11-20

## 2018-11-20 MED ORDER — HEPARIN SODIUM (PORCINE) 5000 UNIT/ML IJ SOLN
5000.00 | INTRAMUSCULAR | Status: DC
Start: 2018-11-20 — End: 2018-11-20

## 2018-11-20 MED ORDER — GENERIC EXTERNAL MEDICATION
1.00 | Status: DC
Start: ? — End: 2018-11-20

## 2018-11-20 MED ORDER — NIFEDIAC CC PO
2.00 | ORAL | Status: DC
Start: ? — End: 2018-11-20

## 2018-11-20 MED ORDER — LAMOTRIGINE 200 MG PO TABS
200.00 | ORAL_TABLET | ORAL | Status: DC
Start: 2018-11-21 — End: 2018-11-20

## 2018-11-20 MED ORDER — METRISET BURETTE SET MISC
100.00 | Status: DC
Start: ? — End: 2018-11-20

## 2018-11-20 MED ORDER — ASPIRIN-CAFFEINE PO
5.00 | ORAL | Status: DC
Start: ? — End: 2018-11-20

## 2018-11-20 MED ORDER — TOPIRAMATE 25 MG SPRINKLE CAPSULE
ORAL_CAPSULE | Freq: Every day | ORAL | 0 refills | 0 days | Status: CP
Start: 2018-11-20 — End: 2019-01-14

## 2018-11-20 MED ORDER — ONDANSETRON 4 MG DISINTEGRATING TABLET
ORAL_TABLET | Freq: Three times a day (TID) | ORAL | 0 refills | 0.00000 days | Status: SS | PRN
Start: 2018-11-20 — End: 2018-12-16

## 2018-11-20 MED ORDER — PROPRANOLOL 20 MG TABLET
ORAL_TABLET | Freq: Two times a day (BID) | ORAL | 0 refills | 0.00000 days | Status: CP
Start: 2018-11-20 — End: 2019-01-14

## 2018-11-20 MED ORDER — OXYCODONE 5 MG/5 ML ORAL SOLUTION
ORAL | 0 refills | 0 days | Status: CP | PRN
Start: 2018-11-20 — End: 2018-12-09

## 2018-11-20 NOTE — Unmapped (Addendum)
Ms. Jacqueline Hall is a 33 y.o. female who was admitted to the hospital for a robotic sleeve gastrectomy on 11/19/2018. She tolerated the procedure well and was transferred to the step-down unit for further management. On POD1 she had an Upper GI study which showed appropriate progression of the contrast and she was started on clear liquid diet. She tolerated well the advancement to 3 oz of clears and she can have full liquids maintaining the instructions she received at discharge. She was voiding spontaneously, ambulating, and was able to climb stairs. Pain and nausea were controlled with PO medications.

## 2018-11-20 NOTE — Unmapped (Signed)
Pt is aaox4. NSR 60-80. PVC's noted on monitor; bigeminous at times; pt does not endorse any cardiovascular symptoms. Normotensive. Satting WNL on room air when awake; requires 1L [place] when sleeping due to apparent apnea. Notes that she does have a cpap at home but does not use it.    Complaining of pain and nausea throughout day; PRNs given accordingly (see mar). PO intake has been good; now on 3oz clear and tolerating it well. Voiding, adequate urine output. No BM. Ambulates in hall without assistance; notes increased pain during ambulation but appears well otherwise. Will ctm / maintain safe env.        Problem: Skin Injury Risk Increased  Goal: Skin Health and Integrity  Outcome: Progressing     Problem: Adult Inpatient Plan of Care  Goal: Plan of Care Review  Outcome: Progressing  Goal: Patient-Specific Goal (Individualization)  Outcome: Progressing  Goal: Absence of Hospital-Acquired Illness or Injury  Outcome: Progressing  Goal: Optimal Comfort and Wellbeing  Outcome: Progressing  Goal: Readiness for Transition of Care  Outcome: Progressing  Goal: Rounds/Family Conference  Outcome: Progressing     Problem: Hypertension Comorbidity  Goal: Blood Pressure in Desired Range  Outcome: Progressing     Problem: Obstructive Sleep Apnea Risk or Actual (Comorbidity Management)  Goal: Unobstructed Breathing During Sleep  Outcome: Progressing     Problem: Wound  Goal: Optimal Wound Healing  Outcome: Progressing     Problem: Fall Injury Risk  Goal: Absence of Fall and Fall-Related Injury  Outcome: Progressing

## 2018-11-20 NOTE — Unmapped (Addendum)
SURGERY PROGRESS NOTES    Ms. Jacqueline Hall is a 33 y.o. female with morbid obesity and related comorbidities, who is POD 1 of Robotic sleeve gastrectomy.    Subjective:   No acute events overnight. Complains of pain at the incision sites but this is controlled with medications. No fever or chills. No flatus or bowel movement. Had some nausea, requiring prometazine and zofran with some relief. Pulled 1000 cc on IS compared to 600 at immediate post-op.    Objective:     Vital signs in last 24 hours:  Temp:  [36.2 ??C-37.4 ??C] 36.6 ??C  Heart Rate:  [61-84] 63  SpO2 Pulse:  [60-87] 63  Resp:  [13-27] 16  BP: (111-140)/(52-77) 124/76  MAP (mmHg):  [70-95] 91  SpO2:  [88 %-99 %] 96 %    Intake/Output last 24 hours:  I/O last 3 completed shifts:  In: 3709.3 [I.V.:3348.3; IV Piggyback:361]  Out: 1350 [Urine:1325; Blood:25]    Physical Exam:  Physical Exam  Vitals signs and nursing note reviewed.   Constitutional:       Appearance: Normal appearance.   HENT:      Head: Normocephalic.      Nose: Nose normal.   Eyes:      Extraocular Movements: Extraocular movements intact.      Pupils: Pupils are equal, round, and reactive to light.   Neck:      Musculoskeletal: Normal range of motion.   Cardiovascular:      Rate and Rhythm: Normal rate and regular rhythm.      Pulses: Normal pulses.   Pulmonary:      Effort: Pulmonary effort is normal. No respiratory distress.   Abdominal:      General: There is no distension.      Palpations: Abdomen is soft. There is no mass.      Tenderness: There is abdominal tenderness (on incision sites). There is no guarding or rebound.      Comments: Incisions clean, dry, with exofin intact.   Skin:     General: Skin is warm.      Capillary Refill: Capillary refill takes less than 2 seconds.   Neurological:      General: No focal deficit present.      Mental Status: She is alert and oriented to person, place, and time.       Data Review:    All lab results last 24 hours:    Recent Results (from the past 24 hour(s))   POCT Glucose    Collection Time: 11/19/18 12:03 PM   Result Value Ref Range    Glucose, POC 165 70 - 179 mg/dL   POCT Glucose    Collection Time: 11/19/18  5:27 PM   Result Value Ref Range    Glucose, POC 102 70 - 179 mg/dL   POCT Glucose    Collection Time: 11/19/18  8:56 PM   Result Value Ref Range    Glucose, POC 85 70 - 179 mg/dL   Comprehensive metabolic panel    Collection Time: 11/20/18  5:36 AM   Result Value Ref Range    Sodium 141 135 - 145 mmol/L    Potassium 4.2 3.5 - 5.0 mmol/L    Chloride 106 98 - 107 mmol/L    Anion Gap 9 7 - 15 mmol/L    CO2 26.0 22.0 - 30.0 mmol/L    BUN 13 7 - 21 mg/dL    Creatinine 2.95 6.21 - 1.00 mg/dL  BUN/Creatinine Ratio 14     EGFR CKD-EPI Non-African American, Female 84 >=60 mL/min/1.32m2    EGFR CKD-EPI African American, Female >90 >=60 mL/min/1.71m2    Glucose 90 70 - 179 mg/dL    Calcium 9.1 8.5 - 16.1 mg/dL    Albumin 3.7 3.5 - 5.0 g/dL    Total Protein 6.0 (L) 6.5 - 8.3 g/dL    Total Bilirubin 0.4 0.0 - 1.2 mg/dL    AST 096 (H) 14 - 38 U/L    ALT 168 (H) <35 U/L    Alkaline Phosphatase 44 38 - 126 U/L   Magnesium    Collection Time: 11/20/18  5:36 AM   Result Value Ref Range    Magnesium 1.9 1.6 - 2.2 mg/dL   Phosphorus Level    Collection Time: 11/20/18  5:36 AM   Result Value Ref Range    Phosphorus 3.3 2.9 - 4.7 mg/dL   CBC w/ Differential    Collection Time: 11/20/18  5:36 AM   Result Value Ref Range    WBC 11.2 (H) 4.5 - 11.0 10*9/L    RBC 4.64 4.00 - 5.20 10*12/L    HGB 13.3 (L) 13.5 - 16.0 g/dL    HCT 04.5 40.9 - 81.1 %    MCV 86.0 80.0 - 100.0 fL    MCH 28.7 26.0 - 34.0 pg    MCHC 33.3 31.0 - 37.0 g/dL    RDW 91.4 (H) 78.2 - 15.0 %    MPV 6.6 (L) 7.0 - 10.0 fL    Platelet 317 150 - 440 10*9/L    Neutrophils % 66.2 %    Lymphocytes % 21.7 %    Monocytes % 7.9 %    Eosinophils % 1.1 %    Basophils % 0.3 %    Absolute Neutrophils 7.4 2.0 - 7.5 10*9/L    Absolute Lymphocytes 2.4 1.5 - 5.0 10*9/L    Absolute Monocytes 0.9 (H) 0.2 - 0.8 10*9/L    Absolute Eosinophils 0.1 0.0 - 0.4 10*9/L    Absolute Basophils 0.0 0.0 - 0.1 10*9/L    Large Unstained Cells 3 0 - 4 %   POCT Glucose    Collection Time: 11/20/18  6:05 AM   Result Value Ref Range    Glucose, POC 88 70 - 179 mg/dL     Imaging:   No results found.      Assessment/Plan:     Ms. Jacqueline Hall is a 33 y.o. female with morbid obesity and related comorbidities, who is POD 1 of Robotic sleeve gastrectomy.    Neuro:   Pain:  - NO NSAIDS. Waiting for UGI study before starting oral meds.    Pulm:   - Encourage ambulation ??  - Use IS 10x per hour while awake. Pulled 1000 cc this AM.  - NO CPAP or BIPAP x 4 weeks post-op.    FEN/GI:   - NPO until UGI study today. ??  - NO NGT tubes.  - Treat nausea aggressively.  - If Upper GI study is negative for leaks, please initiate Gastric Bypass Clear Liquid 1oz diet. ??Pt must tolerate 1 tray with no N/V, then advance to Gastric Bypass Clear Liquid 3oz. ??Pt must tolerated 1 tray with no N/V, then advance to Gastric Bypass Full Liquid 3oz (discharge diet).  - NO Carbonated Drinks  - Daily BMP, Mag, Phos    GU:  - Voiding spontaneously    PPX:   - Heparin SQ 5,000 TID.  -  SCD's    Wound:   - Incisions closed using subcuticular, absorbable suture and covered in exofin. ??  - No dressing needed. Pt is okay to shower.    Addendum:  Patient tolerated 3 oz of clears well, she is ambulating and was able to climb stairs with no issues. Patient will be discharged home today. Prescriptions were sent to our pharmacy.    Teaching Surgeon Attestation:  I, D. Remigio Eisenmenger M.D., saw and evaluated the patient. I have reviewed and edited the above note, and I agree with the findings and the plan of care as documented in the Resident's note.    Berton Mount, MD  11/20/2018  9:06 AM

## 2018-11-20 NOTE — Unmapped (Signed)
Pt calm and cooperative with post op care throughout night, states slept well. Pain controlled with IV morphine. Complains of feeling nauseated on and off and received both the IV Zofran and Phenergan. Tolerated weaning oxygen to 1 liter per nasal cannula. Initial incentive spirometry volumes were 500-661ml with good effort.  This morning patient was able to reach 700-1064ml.  Tolerating ambulating in the room, voided x2. Incisions intact with slight bruising to sites. Pt NSR on monitor with occasional isolated PVC's and occasional short bigeminy noted.      Problem: Adult Inpatient Plan of Care  Goal: Plan of Care Review  Outcome: Progressing  Note: Pt cooperative with post op goals including ambulation and incentive spirometry use.  Goal: Patient-Specific Goal (Individualization)  Outcome: Progressing  Flowsheets (Taken 11/20/2018 0615)  Individualized Care Needs: obtain on incentive spirometer     Problem: Hypertension Comorbidity  Goal: Blood Pressure in Desired Range  Outcome: Progressing     Problem: Obstructive Sleep Apnea Risk or Actual (Comorbidity Management)  Goal: Unobstructed Breathing During Sleep  Outcome: Progressing     Problem: Skin Injury Risk Increased  Goal: Skin Health and Integrity  Outcome: Ongoing - Unchanged     Problem: Adult Inpatient Plan of Care  Goal: Absence of Hospital-Acquired Illness or Injury  Outcome: Ongoing - Unchanged  Goal: Optimal Comfort and Wellbeing  Outcome: Ongoing - Unchanged  Goal: Readiness for Transition of Care  Outcome: Ongoing - Unchanged  Goal: Rounds/Family Conference  Outcome: Ongoing - Unchanged     Problem: Wound  Goal: Optimal Wound Healing  Outcome: Ongoing - Unchanged

## 2018-11-20 NOTE — Unmapped (Signed)
Discharge Summary    Admit date: 11/19/2018    Discharge date and time: 11/20/2018 4:45 pm    Discharge to:  Home    Discharge Service: General Surgery    Discharge Attending Physician: Colon Branch, MD    Discharge  Diagnoses: Morbid obesity    Secondary Diagnosis: Principal Problem:    Morbid obesity with BMI of 50.0-59.9, adult (CMS-HCC) POA: Not Applicable  Resolved Problems:    * No resolved hospital problems. *      OR Procedures:    Midline - R25 ROBOTIC PR LAP, GAST RESTRICT PROC, LONGITUDINAL GASTRECTOMY  Date  11/19/2018  -------------------     Ancillary Procedures: no procedures    Discharge Day Services:     Subjective   No acute events overnight. Complains of pain at the incision sites but this is controlled with medications. No fever or chills. No flatus or bowel movement. Had some nausea, requiring prometazine and zofran with some relief. Pulled 1000 cc on IS compared to 600 at immediate post-op.    Objective   Patient Vitals for the past 8 hrs:   BP Temp Temp src Pulse SpO2 Pulse Resp SpO2   11/20/18 1556 ??? 36.8 ??C Oral ??? ??? ??? ???   11/20/18 1555 107/57 ??? ??? 77 77 19 97 %   11/20/18 1428 ??? ??? ??? ??? ??? ??? 92 %   11/20/18 1125 112/65 ??? ??? 66 61 18 97 %   11/20/18 1122 ??? 36.7 ??C Oral ??? ??? ??? ???     I/O this shift:  In: 1300.8 [P.O.:377; I.V.:893.3; IV Piggyback:30.5]  Out: 700 [Urine:700]    Physical Exam  Vitals signs and nursing note reviewed.   Constitutional:       Appearance: Normal appearance.   HENT:      Head: Normocephalic.      Nose: Nose normal.   Eyes:      Extraocular Movements: Extraocular movements intact.      Pupils: Pupils are equal, round, and reactive to light.   Neck:      Musculoskeletal: Normal range of motion.   Cardiovascular:      Rate and Rhythm: Normal rate and regular rhythm.      Pulses: Normal pulses.   Pulmonary:      Effort: Pulmonary effort is normal. No respiratory distress.   Abdominal:      General: There is no distension.      Palpations: Abdomen is soft. There is no mass. Tenderness: There is abdominal tenderness (on incision sites). There is no guarding or rebound.      Comments: Incisions clean, dry, with exofin intact.   Skin:     General: Skin is warm.      Capillary Refill: Capillary refill takes less than 2 seconds.   Neurological:      General: No focal deficit present.      Mental Status: She is alert and oriented to person, place, and time.     Hospital Course:  Jacqueline Hall is a 33 y.o. female who was admitted to the hospital for a robotic sleeve gastrectomy on 11/19/2018. She tolerated the procedure well and was transferred to the step-down unit for further management. On POD1 she had an Upper GI study which showed appropriate progression of the contrast and she was started on clear liquid diet. She tolerated well the advancement to 3 oz of clears and she can have full liquids maintaining the instructions she received at discharge. She  was voiding spontaneously, ambulating, and was able to climb stairs. Pain and nausea were controlled with PO medications.      Condition at Discharge: Improved  Discharge Medications:      Medication List      START taking these medications    ??? ondansetron 4 MG disintegrating tablet; Commonly known as: ZOFRAN-ODT;   Take 1 tablet (4 mg total) by mouth every eight (8) hours as needed for   nausea for up to 14 days.  ??? oxyCODONE 5 mg/5 mL solution; Commonly known as: ROXICODONE; Take 5 mL   (5 mg total) by mouth every four (4) hours as needed for up to 5 days.  ??? propranoloL 20 MG tablet; Commonly known as: INDERAL; Take 1.5 tablets   (30 mg total) by mouth Two (2) times a day.; Replaces: propranoloL 60 mg   24 hr capsule  ??? topiramate 25 MG capsule; Commonly known as: TOPAMAX; Take 2 capsules   (50 mg total) by mouth daily.; Replaces: topiramate 50 MG tablet     CHANGE how you take these medications    ??? levothyroxine 25 MCG tablet; Commonly known as: SYNTHROID; Take 1.5   tablets (37.5 mcg total) by mouth daily.; What changed: how much to take     CONTINUE taking these medications    ??? HUMIRA(CF) PEN 40 mg/0.4 mL injection; Generic drug: adalimumab; INJECT   40 MG (0.4 ML) UNDER THE SKIN EVERY 14 DAYS  ??? lamoTRIgine 200 MG tablet; Commonly known as: LaMICtal  ??? MAGOX 400 mg (241.3 mg magnesium) tablet; Generic drug: magnesium oxide  ??? MEDICAL SUPPLY ITEM; Please provide patient with 2 left carpal tunnel   syndrome braces for wrists.  ??? NASCOBAL 500 mcg/spray Spry; Generic drug: cyanocobalamin (vitamin   B-12); 1 spray into 1 nare once a week  ??? ORILISSA 150 mg Tab; Generic drug: elagolix  ??? spironolactone 50 MG tablet; Commonly known as: ALDACTONE     STOP taking these medications    ??? celecoxib 200 MG capsule; Commonly known as: CeleBREX  ??? omeprazole 40 MG capsule; Commonly known as: PriLOSEC  ??? propranoloL 60 mg 24 hr capsule; Commonly known as: INDERAL LA; Replaced   by: propranoloL 20 MG tablet  ??? topiramate 50 MG tablet; Commonly known as: TOPAMAX; Replaced by:   topiramate 25 MG capsule       Pending Test Results:   None    Discharge Instructions:  Activity:   Activity Instructions     Activity as tolerated      Lifting restrictions (specify)      Weight restriction of 20 lbs. X 6 weeks    OK to shower (no bath)          Diet:  Diet Instructions     Discharge diet (specify)      Discharge Nutrition Therapy:  Bariatric    Liquid diet plus protein shakes with goal of 64 ounces of liquid per day and 60-80 grams of protein per day.             Other Instructions:  Other Instructions     Call MD for:  difficulty breathing, headache or visual disturbances      Call MD for:  persistent nausea or vomiting      Call MD for:  redness, tenderness, or signs of infection (pain, swelling, redness, odor or green/yellow discharge around incision site)      Call MD for:  severe uncontrolled pain      Call  MD for:  temperature >38.5 Celsius (101.3 Fahrenheit)      Discharge instructions      BARIATRIC POST-OPERATIVE INSTRUCTIONS:  Your surgery will be done through 4 small incisions on your abdomen.  The incisions will be closed with suture under your skin (the suture absorbs and does not need to be removed) and covered in surgical glue that will fall off, usually in a week or two.  Do not scrub your incisions.      You may shower after the procedure, but please avoid baths, hot tubs, pools, soaking or sitting in water for 2 weeks.  Please allow the soap and water to run over your incision.  Do not scrub your incision.       Take prescribed medications as instructed. Do not drive while taking narcotics.     Call (see numbers below) for: fever >101.16F by mouth, uncontrolled nausea or vomiting, pain uncontrolled with prescribed medication, or inability to pass stool for more than 3 days; also call for any new or concerning symptoms.     Inspect surgical sites at least twice daily, call for spreading redness, purulent discharge, or increasing bleeding or drainage, or for separation of wounds.         BARIATRIC POST-OPERATIVE NUTRITION INFORMATION    Purpose: This diet is for individuals who are preparing for or have had weight loss surgery.    Pre-Surgery Dietary Goals  Follow a consistent meal pattern. Eat 3 small meals and 2 snacks.   Do not skip meals and do not graze.  Do not go long periods of time (more than 4 hr) without eating.  Have protein with every meal. Aim for 60-80g daily.  Drink 64 oz of water daily.  Avoid all sugary drinks (Examples: juice, soda, sweet tea)  Avoid carbonated beverages. (Examples: soda, seltzer, sparkling water)  Practice not drinking with meals, wait 30 minutes after the meal before drinking.  Start a chewable multivitamin.  Exercise - Aim for 30 minutes, most days of the week.  Keep a food journal.        After Surgery Diet Stages  Step 1: Clear Liquids - Day 1 thru week three post-op  Once your doctor says it is okay, you will be given small amounts of sugar free/non-carbonated beverages. You will start with 1 ounce liquid diet per hour while in the hospital and move up to 3 ounces per hour prior to your discharge.    Clear liquids include: water, broth, sugar-free Jell-O, sugar-free popsicles, caffeine-free unsweetened teas, and sugar-free drink mixes (Crystal Light).    To prevent nausea and vomiting, do not drink too much at one time, do not gulp.  Drink slowly!  Once you can take in 3 ounces of clear liquids, you will start to drink protein shakes.  You will go home on protein shakes.  You should also drink plenty of clear liquids to stay well hydrated.  TRACK YOUR PROTEIN INTAKE!  Protein shakes are your best choice during this phase and are the easiest way to meet your protein goal. Your protein goal is 60-80 grams per day if you are a female and 80-100 grams per day if you are a female.  Do not drink juice or other sugar sweetened beverages (soda, sweet tea, Kool-Aid, Gatorade).  Beverages should have less than 5 calories per serving.  Do not add sugar, honey or agave to foods and beverages.  It is okay to add sugar substitutes such as Splenda (sucralose), Stevia, or  Equal (aspartame).        DAILY GOALS:  PROTEIN: 60-80 g/day (female) 80-100 g/day (female)  FLUID: 64 ounces/day minimum    GOALS: Your goal is to drink 32 fluid ounces of water plus enough protein shakes to get 60-80 grams of protein for women and 80-100 grams for men.  Please stick to this plan until your clinic visit with Dr. Augustine Radar.    Foods to Avoid:  Applesauce and baby food fruit  High carbohydrate foods like mashed potatoes, grits, and oatmeal  These foods are high in carbohydrates and low in protein.      Protein Supplement Products  For the first few months after surgery, most patients need at least 1 protein drink per day to get at least 60 grams by the end of the day.  Here are some tips for picking a good option:  The best choice is something that gives you a high number of protein grams for a low number of calories.  For example, 30 grams of protein for 160 calories is better than 10 grams of protein for 200 calories.  Whey protein is preferred; however, soy, egg, and vegan options are also available. If you are lactose intolerant, look for a protein powder with whey protein isolate as the only protein source.  Flavored, unflavored, and even clear liquid options are available.  Compare cost per gram of protein.  There are several affordable options.  It is convenient to have both pre-made drinks and powders.  As a general guideline, choose something with at least 15 grams of protein, less than 5 grams of sugar, and less than 3 grams of fat.  Here are some protein supplement options:   Premier Protein Shakes - 30g protein, 160 calories  EAS AdvantEdge Carb Control- 17g protein, 100 calories  Atkins Lift Protein Drink - 20g protein, 90 calories, clear liquid  Orgain Organic & Planet Based Protein Powder & Shakes - 21g protein  GNC 100% Whey Protein Powder - 24g protein, 140 calories per scoop  Designer Whey Protein Powder - 18g protein, 100 calories per scoop  Pure Protein Natural Whey Powder - 23g protein, 130 calories  Isopure Low Carb Protein Powder- 25g protein, 110 calories per scoop  Matrix and Nectar Protein Powder- for fun flavors, [phone] or www.DotProtection.gl    Unjury Protein Powder - 21g protein, 100 calories per scoop - [phone] or www.unjury.com  Pro-Stat - 15g protein and 100 calories in 1 fluid ounce dose     Bariatric Vitamin Supplement Guide    You will need to take vitamins for the rest of your life.  Vitamins should be chewable, liquid, or crushed until 6 months post-op.  DO NOT TAKE GUMMY MULTIVITAMINS.  Type Amount  Multivitamin  NO GUMMIES 200% DRI for most nutrients  Should also contain:  Thiamin - at least 12 mg  Folate - 400-800 mcg  Zinc - 8-22 mg  Copper - 2 mg  Magnesium  Selenium  Calcium 1200-1500 mg per day  Vitamin D 3000 IU per day  Iron Males: 18 mg per day  Females: 45-60 mg per day  Vitamin B12 350-500 mcg per day (sublingual) Multivitamins  Type Daily Amount Additional Supplements to Take Where to Purchase  Bariatric Advantage Advanced Multi EA 2 Calcium www.bariatric advantage.com  Bariatric Choice All-in-One 4  www.bariatricchoice.com  Bariatric Fusion 4  www.bariatricfusion.com  Celebrate Multi-Complete with iron (chewable)  *available in 4 different iron levels 2 Calcium www.celebratevitamins.com  Flintstones Complete Chewable 2 Calcium,  vitamin D, vitamin B12, thiamin, and iron Widely available  Optisource 4 Thiamin [facility]  Opurity Bypass & Sleeve Chewable 1 Calcium  May need additional iron www.opurity.com  ProCare 1 Calcium www.procarenow.com      Calcium  1200-1500 mg daily   Calcium citrate is preferred. If your calcium supplement contains calcium carbonated, take it with your meal.  Calcium is best absorbed spread out throughout the day.  Do not take iron and calcium at the same time. Separate by 2 hours.  Type Daily Amount  Bariatric Advantage Calcium Chewy Bite 2-3 per day  Celebrate - Calcium plus 500, Chewy Bite 2-3 per day  Citracal Pearls or Gummies (calcium carbonate) 6 per day  Opurity Calcium Citrate 4 per day  Viactiv chews (calcium carbonated) 2 per day  Wellesse Liquid Calcium 2 Tbsp.      Iron  Check your multivitamin. Some multivitamins already contain your daily dose of iron.  Do not take iron and calcium at the same time. Separate by 2 hours.  If you need additional iron, some options include:  Bariatric Advantage Chewable  Celebrate Vitamins Chewable  Wellesse Liquid Iron    Nascobal/BariActiv Vitamins  Available by prescription only (not covered by Medicare or Medicaid)  Nascobal (vitamin B12) - 1 intranasal spray, 1x per week  BariActiv Multivitamin - 2 per day  BariActiv Calcium + Vitamin D - 4 per day  BariActiv Iron - 3 per day  Includes free shipping and auto refills  Available in chewable or capsules.        If you are interested in taking a multivitamin that is not listed above, please ask the dietitian if it meets ASMBS guidelines prior to taking it.    Resources  The Emotional First + Aid Kit: A Practical Guide to Life after Bariatric Surgery by Aram Beecham L. Alexander, PsyD.  50 Ways to Soothe Yourself without Food by Randel Pigg, PsyD.  http://www.obesityaction.org Your Weight Matters Magazine and other resources on obesity.  Recipe Websites  https://www.unjury.com/blog/recipes/  http://www.kim.net/.aspx  https://www.celebratevitamins.com/about-you/recipes.html   DomainThemes.gl     Attending [facility] Bariatric Surgery Support Group meetings can be very helpful -  [facility] - When: 3rd Wednesday of the month.  Where: [facility], [address], [place], [place].  [place] - When: 1st Thursday of the month.  Where: [facility], 1st Floor Lobby, [address] [place], [place].  ______________________________________________________________________________  Teacher, early years/pre Dietitians at [phone] or via Engineer, mining Bariatric Surgery at [place] [phone]         Patient to schedule follow-up appointment with surgeon in 1-2 weeks      Please call [phone] and ask to schedule an appointment with Dr. Augustine Radar in the General Surgery clinic for your follow-up appointment in 1-2 weeks following your surgery.     If you have a non-emergency regarding your surgery, please call our clinic RN at [phone].    If you have an emergency during regular business hours regarding your surgery, please go to your nearest Emergency Department   OR call our clinic RN at [phone]   OR call Dr. Augustine Radar on his Cell: [phone]   OR call the hospital operator at [phone] and ask for the [facility] General Surgeon On-Call: Attending Consultant.  The On-Call Surgeon will be paged and will call you back.      If you have an emergency outside of regular business hours regarding your surgery, please go to your  nearest Emergency Department OR call Dr. Augustine Radar on his Cell: [phone] OR Home: [phone].             Labs and Other Follow-ups after Discharge:  Follow Up instructions and Outpatient Referrals     Call MD for:  difficulty breathing, headache or visual disturbances      Call MD for:  persistent nausea or vomiting      Call MD for:  redness, tenderness, or signs of infection (pain, swelling, redness, odor or green/yellow discharge around incision site)      Call MD for:  severe uncontrolled pain      Call MD for:  temperature >38.5 Celsius (101.3 Fahrenheit)      Discharge instructions            Future Appointments:  Appointments which have been scheduled for you    Dec 31, 2018  9:45 AM EDT  (Arrive by 9:30 AM)  NEW  GENERAL with Shari Heritage, MD  [place] DERMATOLOGY [address] [facility] REG[facility]) [address]  [place] [address]  [phone]      Jan 01, 2019  9:45 AM EDT  (Arrive by 9:30 AM)  RETURN  RHEUMATOLOGY with Otho Perl, MD  [facility] RHEUMATOLOGY [address] [place] [facility] REG[facility]) [phone] [address]  [place] [address]  [phone]           I saw and evaluated the patient, participating in the key portions of the service on the day of discharge.?? I reviewed the resident???s note and agree with the discharge plans and disposition. I personally spent less than 30 minutes in discharge planning services. Aida Raider, MD

## 2018-11-20 NOTE — Unmapped (Signed)
Care Management  Initial Transition Planning Assessment              General  Care Manager assessed the patient by : In person interview with patient, Medical record review, Discussion with Clinical Care team  Orientation Level: Oriented X4  Who provides care at home?: N/A  Reason for referral: Discharge Planning    Contact/Decision Maker  Extended Emergency Contact Information  Primary Emergency Contact: Pittman,Judy   [place] Phone: [phone]  Relation: Grandparent    Type of Residence: Mailing Address:  [address]  [place] [address]  Contacts: [place] [phone]  Patient Phone Number: [phone]        Medical Provider(s): Smitty Cords, DO  Reason for Admission: Admitting Diagnosis:  Morbid obesity with BMI of 50.0-59.9, adult (CMS-HCC) [E66.01, Z68.43]  Past Medical History:   has a past medical history of Abnormal Pap smear of cervix (2013), Anemia (2013), Ankylosing spondylitis (CMS-HCC), Arthritis (2018), Bipolar 1 disorder (CMS-HCC) (2018), Depression, Disease of thyroid gland, Elevated blood-pressure reading without diagnosis of hypertension, Headache(784.0), History of pre-eclampsia in prior pregnancy, currently pregnant (11/2011), 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  Past Surgical History:   has a past surgical history that includes Cholecystectomy (12/20/2007); Tubal ligation; pr upper gi endoscopy,diagnosis (N/A, 07/17/2016); and pr lap, gast restrict proc, longitudinal gastrectomy (Midline, 11/19/2018).   Previous admit date: 06/14/2015    Primary Insurance- Payor: BCBS / Plan: BCBS OOS PLAN / Product Type: *No Product type* /   Secondary Insurance ??? None  Prescription Coverage ??? yes  Preferred Pharmacy - [id number] - [place], [place] - [address]  [facility] - [place], [place] - [address]  [facility] - [place], [place] - [address]    Transportation home: Private vehicle  Level of function prior to admission: Independent        Legal Next of Kin / Guardian / POA / Advance Directives       Advance Directive (Medical Treatment)  Does patient have an advance directive covering medical treatment?: Patient does not have advance directive covering medical treatment., Patient would not like information.  Reason patient does not have an advance directive covering medical treatment:: Patient does not wish to complete one at this time  Information provided on advance directive:: No  Patient requests assistance:: No    Health Care Decision Maker [HCDM] (Medical & Mental Health Treatment)  Information provided on advance directive:: No  Patient requests assistance:: No    Advance Directive (Mental Health Treatment)  Does patient have an advance directive covering mental health treatment?: Patient does not have advance directive covering mental health treatment., Patient would not like information.  Reason patient does not have an advance directive covering mental health treatment:: Patient does not wish to complete one at this time.    Patient Information  Lives with: Spouse/significant other    Type of Residence: Private residence        Location/Detail: [address] [place] [address] phone [phone]    Support Systems: Family Members    Responsibilities/Dependents at home?: No    Home Care services in place prior to admission?: No          Outpatient/Community Resources in place prior to admission: Clinic  Agency detail (Name/Phone #): PCP Dr. Saralyn Pilar  Equipment Currently Used at Home: none       Currently receiving outpatient dialysis?: No       Financial Information       Need for financial assistance?: No       Social Determinants of Health  Social Determinants of Health were addressed in provider documentation.  Please refer to patient history.    Discharge Needs Assessment  Concerns to be Addressed: no discharge needs identified    Clinical Risk Factors: New Diagnosis    Barriers to taking medications: No    Prior overnight hospital stay or ED visit in last 90 days: No    Readmission Within the Last 30 Days: no previous admission in last 30 days         Anticipated Changes Related to Illness: none    Equipment Needed After Discharge: none    Discharge Facility/Level of Care Needs: other (see comments)(home with self care)    Readmission  Risk of Unplanned Readmission Score: UNPLANNED READMISSION SCORE: 7%  Predictive Model Details           7% (Low) Factors Contributing to Score   Calculated 11/20/2018 13:10 29% Number of active Rx orders is 31   [place] Risk of Unplanned Readmission Model 26% Active NSAID Rx order is present     9% Imaging order is present in last 6 months     8% Latest hemoglobin is low (13.3 g/dL)     8% Phosphorous result is present     6% Diagnosis of deficiency anemia is present     6% Active anticoagulant Rx order is present     3% Age is 32     Readmitted Within the Last 30 Days? (No if blank)   Patient at risk for readmission?: No    Discharge Plan  Screen findings are: Care Manager reviewed the plan of the patient's care with the Multidisciplinary Team. No discharge planning needs identified at this time. Care Manager will continue to manage plan and monitor patient's progress with the team.    Expected Discharge Date: 11/21/18    Expected Transfer from Critical Care:      Patient and/or family were provided with choice of facilities / services that are available and appropriate to meet post hospital care needs?: N/A       Initial Assessment complete?: Yes     CM initial assessment completed telephonically as a precautionary measure in accordance with [facility] COVID-19 Pandemic emergency response plan. Patient verbalized understanding and agreement with completing this assessment by telephone.

## 2018-11-20 NOTE — Unmapped (Signed)
Adult Nutrition Assessment Note    Visit Type: MD Consult  Reason for Visit: Education (Nutrition)    ASSESSMENT:  HPI: Patient is POD #1 Robotic Sleeve Gastrectomy with Dr. Augustine Radar.  Per Dr. Denita Lung note, procedure was more difficult due to significant central obesity with more than usual intra-abdominal fat.    Nutrition Hx: Patient is familiar to this RD as she has been seen in the outpatient bariatric clinic at [facility].  Patient has had a varied history of weight loss efforts and expressed an understanding and a willingness to follow the post surgery recommendations in order to have a successful surgical outcome.  Patient has tolerated the bariatric 3oz clear liquid diet, but endorsed nausea with no emesis.  Patient has been able to do 1 lap around the unit thus far.  Patient states that she is in significant pain, which is limiting her ability to walk around the unit.      Nutritionally pertinent meds, labs, hospital course reviewed.    Patient Lines/Drains/Airways Status    Active Wounds     Name:   Placement date:   Placement time:   Site:   Days:    Surgical Site 11/19/18 Abdomen Mid   11/19/18    0941     1    Surgical Site 11/19/18 Abdomen Right   11/19/18    0941     1    Surgical Site 11/19/18 Abdomen Left   11/19/18    0941     1                 Current nutrition therapy order:   Nutrition Orders          Nutrition Therapy Gastric Bypass CL 3 oz starting at 06/04 1111    Advance diet as tolerated starting at 06/04 1610           Nutrition Focused Physical Exam:   Nutrition Evaluation  Overall Impressions: Nutrition-Focused Physical Exam not indicated due to lack of malnutrition risk factors. (11/20/18 1509)  Nutrition Designation: Obese class III  (BMI > 39.99 kg/m2) (11/20/18 1509)      DIAGNOSIS:  Malnutrition Assessment using AND/ASPEN Clinical Characteristics:    Patient does not meet AND/ASPEN criteria for malnutrition at this time (11/20/18 1510)                      Overall Nutrition Impression: Patient was given a brief review of the post op diet progression (clear liquid, full liquid, purees, soft, regular), protein/fluid goals, and exercise/vitamin recommendations.  Patient plans to use the Premier protein shakes post op and the Bariactiv vitamin line.  Patient was also given test products of other vitamin options in case the [facility] plan is too difficult to keep up with.  Patient expressed a verbal understanding of all topics discussed and was amenable to suggestions.  Patient is aware that she will need to remain on the full liquid diet until her follow-up visit with Dr. Augustine Radar.  Patient is also aware of 6 week and 3 months post op follow-ups with RD and NP respectively.      GOALS:  Food/Nutrition Knowledge, Lifestyle Modifications:       - Patient will be willing and ready to learn about prescribed bariatric surgery diet progression  - Patient will demonstrate verbal understanding of prescribed bariatric surgery diet progression   - Patient will make appropriate nutritional choices with regards to prescribed bariatric surgery diet progression    RECOMMENDATIONS  AND INTERVENTIONS:  1. Patient educated on 06/04  2. Printed information given for patient to refer to post discharge.        RD Follow Up Parameters:  Signing off at this time (Please reconsult if needed)     I appreciate the opportunity to participate in the care of this patient.  Please contact me with any questions.    Terrence Dupont, MS, RD, LDN  Pager: [phone]

## 2018-11-22 ENCOUNTER — Ambulatory Visit: Admit: 2018-11-22 | Discharge: 2018-11-22 | Disposition: A | Discharge status: Home / Self Care | Payer: MEDICAID

## 2018-11-22 ENCOUNTER — Emergency Department: Admit: 2018-11-22 | Discharge: 2018-11-22 | Disposition: A | Discharge status: Home / Self Care | Payer: MEDICAID

## 2018-11-22 DIAGNOSIS — R0902 Hypoxemia: Principal | ICD-10-CM

## 2018-11-22 DIAGNOSIS — I10 Essential (primary) hypertension: Secondary | ICD-10-CM | POA: Diagnosis not present

## 2018-11-22 DIAGNOSIS — J9811 Atelectasis: Secondary | ICD-10-CM | POA: Diagnosis not present

## 2018-11-22 DIAGNOSIS — R000 Tachycardia, unspecified: Secondary | ICD-10-CM | POA: Diagnosis not present

## 2018-11-22 DIAGNOSIS — I493 Ventricular premature depolarization: Secondary | ICD-10-CM | POA: Diagnosis not present

## 2018-11-22 DIAGNOSIS — R0789 Other chest pain: Secondary | ICD-10-CM | POA: Diagnosis not present

## 2018-11-22 LAB — COMPREHENSIVE METABOLIC PANEL
ALBUMIN: 3.9 g/dL (ref 3.5–5.0)
ALKALINE PHOSPHATASE: 50 U/L (ref 38–126)
ANION GAP: 11 mmol/L (ref 7–15)
AST (SGOT): 59 U/L — ABNORMAL HIGH (ref 14–38)
BILIRUBIN TOTAL: 0.5 mg/dL (ref 0.0–1.2)
BLOOD UREA NITROGEN: 14 mg/dL (ref 7–21)
CALCIUM: 9.2 mg/dL (ref 8.5–10.2)
CHLORIDE: 102 mmol/L (ref 98–107)
CO2: 28 mmol/L (ref 22.0–30.0)
CREATININE: 0.8 mg/dL (ref 0.60–1.00)
EGFR CKD-EPI AA FEMALE: >90 mL/min/{1.73_m2} (ref >=60)
EGFR CKD-EPI NON-AA FEMALE: >90 mL/min/{1.73_m2} (ref >=60)
GLUCOSE RANDOM: 84 mg/dL (ref 70–179)
POTASSIUM: 3.9 mmol/L (ref 3.5–5.0)
PROTEIN TOTAL: 6.5 g/dL (ref 6.5–8.3)
SODIUM: 141 mmol/L (ref 135–145)

## 2018-11-22 LAB — CBC W/ AUTO DIFF
BASOPHILS ABSOLUTE COUNT: 0 10*9/L (ref 0.0–0.1)
BASOPHILS RELATIVE PERCENT: 0.4 %
EOSINOPHILS ABSOLUTE COUNT: 0.2 10*9/L (ref 0.0–0.4)
EOSINOPHILS RELATIVE PERCENT: 1.5 %
HEMATOCRIT: 39.7 % (ref 36.0–46.0)
HEMOGLOBIN: 13.5 g/dL (ref 13.5–16.0)
LARGE UNSTAINED CELLS: 2 % (ref 0–4)
LYMPHOCYTES ABSOLUTE COUNT: 2.2 10*9/L (ref 1.5–5.0)
LYMPHOCYTES RELATIVE PERCENT: 20.3 %
MEAN CORPUSCULAR HEMOGLOBIN: 28.9 pg (ref 26.0–34.0)
MEAN CORPUSCULAR VOLUME: 85.1 fL (ref 80.0–100.0)
MEAN PLATELET VOLUME: 6.7 fL — ABNORMAL LOW (ref 7.0–10.0)
MONOCYTES ABSOLUTE COUNT: 0.8 10*9/L (ref 0.2–0.8)
MONOCYTES RELATIVE PERCENT: 7.6 %
NEUTROPHILS ABSOLUTE COUNT: 7.5 10*9/L (ref 2.0–7.5)
NEUTROPHILS RELATIVE PERCENT: 68.3 %
PLATELET COUNT: 315 10*9/L (ref 150–440)
RED CELL DISTRIBUTION WIDTH: 14.9 % (ref 12.0–15.0)
WBC ADJUSTED: 10.9 10*9/L (ref 4.5–11.0)

## 2018-11-22 LAB — BLOOD GAS CRITICAL CARE PANEL, VENOUS
BASE EXCESS VENOUS: 1 (ref -2.0–2.0)
CALCIUM IONIZED VENOUS (MG/DL): 4.7 mg/dL (ref 4.40–5.40)
GLUCOSE WHOLE BLOOD: 86 mg/dL
HCO3 VENOUS: 25 mmol/L (ref 22–27)
HEMOGLOBIN BLOOD GAS: 15.5 g/dL (ref 12.00–16.00)
O2 SATURATION VENOUS: 87.3 % — ABNORMAL HIGH (ref 40.0–85.0)
PCO2 VENOUS: 46 mmHg (ref 40–60)
PH VENOUS: 7.36 (ref 7.32–7.43)
PO2 VENOUS: 55 mmHg (ref 30–55)
POTASSIUM WHOLE BLOOD: 3.5 mmol/L (ref 3.4–4.6)
SODIUM WHOLE BLOOD: 138 mmol/L (ref 135–145)

## 2018-11-22 LAB — POTASSIUM WHOLE BLOOD: Potassium:SCnc:Pt:Bld:Qn:: 3.5

## 2018-11-22 LAB — PROTEIN TOTAL: Protein:MCnc:Pt:Ser/Plas:Qn:: 6.5

## 2018-11-22 LAB — TROPONIN I: Troponin I.cardiac:MCnc:Pt:Ser/Plas:Qn:: <0.06

## 2018-11-22 LAB — HEPARIN CORRELATION: Lab: 0.2

## 2018-11-22 LAB — PROTIME: Lab: 14.4 — ABNORMAL HIGH

## 2018-11-22 LAB — PREGNANCY TEST URINE: Lab: NEGATIVE

## 2018-11-22 LAB — MONOCYTES ABSOLUTE COUNT: Lab: 0.8

## 2018-11-22 NOTE — Unmapped (Signed)
Pt reports O2 sat at 84% at night and 89% this am on r/a.  Had gastric sleeve Sx on 06/03.  Denies SOB or cough.  Reports intermittent CP on both sides.  Has been taking Oxycodone q 4hrs since the Sx.  Pt in NAD.  Sat at 90% on r/a while at rest in the stretcher.

## 2018-11-22 NOTE — Unmapped (Signed)
[facility]  Emergency Department Provider Note        ED Clinical Impression      Final diagnoses:   Frequent PVCs   Hypoxia (Primary)           Impression, ED Course, Assessment and Plan      Impression: Jacqueline Hall is a 33 y.o. female with a PMH of hypertension, IDA, OSA noncompliant with bipap, anxiety, depression, bipolar I disorder, migraines, hypothyroidism and morbid obesity s/p robotic sleeve gastrectomy on 11/19/2018 who presents to the ED for evaluation of hypoxia in the mid 80s on RA and high 80s at rest since last night.  No fever, cough/congestion or other systemic s/s.  Denies any noted sob however does report that she does not want to take deep breaths d/t pain in abdomen with deep inspiration.  Hypoxia and tachycardia noted with minimal exertion in the room while patient is getting dressed, 88% on room air and patient is tachycardic up to 110 with this mild exertion.  No increase in work of breathing noted and exam otherwise is unremarkable.  Lung sounds CTA bilateral.  Mild BLE edema, nonpitting, no calf pain.    Ddx includes PE vs PNA vs atelectasis.  Does not appear to be acutely fluid overloaded.  Plan to basic labs including loaded VBG, coags, upreg.  Will obtain EKG and place on cardiac monitoring and obtain CTA chest.     3:36 PM  Labs overall unremarkable excluding slight elevation in AST/ALT.  Troponin/VBG normal.    4:11 PM  CTA negative for pulmonary embolism or other acute abnormality.  Will obtain an ambulatory pulse ox and likely admit if hypoxia remains.    4:19 PM  Ambulatory pulse ox high 90s walking in the unit.  As she was initially hypoxic and has remained on 2 liters nasal cannula while here, will continue to monitor on room air for another 30 min and if she is able to maintain adequate O2 sats will d/c home with incentive spirometer.    4:40 PM  Patient is maintaining O2 sats on room air greater than 94% for 30 min after being placed back on room air.  Will give an incentive spirometer and have nursing instruct her again how to use this.  Ducted to follow-up with PCP within 48 to 72 hours for reevaluation.  Strict return parameters were discussed including severe chest pain, shortness of breath, high fevers, syncope, or hypoxia.  Patient verbalized understanding and agreeable to discharge.     Additional Medical Decision Making     I have reviewed the vital signs and the nursing notes. Labs and radiology results that were available during my care of the patient were independently reviewed by me and considered in my medical decision making.     I directly visualized and independently interpreted the EKG tracing.   I independently visualized the radiology images.   I reviewed the patient's prior medical records.     Portions of this record have been created using Scientist, clinical (histocompatibility and immunogenetics). Dictation errors have been sought, but may not have been identified and corrected.  ____________________________________________         History        Chief Complaint  Decreased Level of Oxygen      HPI   Jacqueline Hall is a 33 y.o. female with a PMH of hypertension, IDA, OSA noncompliant with bipap, anxiety, depression, bipolar I disorder, migraines, hypothyroidism and morbid obesity s/p robotic sleeve gastrectomy  on 11/19/2018 who presents to the ED for evaluation of hypoxia.  Patient sleeve gastrectomy on 6/3 with no subsequent complications and discharged the next day on 6/4.  Was noting during her visit to have hypoxia at nighttime and required 1 L nasal cannula due to history of sleep apnea.  She does report she has a CPAP at home however has not used this for multiple years as she felt she did not need it due to no shortness of breath and was having full night sleep without any difficulty.  She denies any significant shortness of breath since her discharge 2 days ago however does report that last night she checked her oxygen level and she was 84% on ambulation and 88% at rest.  Patient does not wear oxygen at home at baseline.  Although she denies feeling sob, she does note that she is not taking deep breaths d/t increased pain at postop site with deep inspiration.  States that upon her discharge she left her incentive spirometer at the hospital hence has not been using this at home.  Was able to get to 1000 cc on IS prior to d/c compared to 600 cc immediately postop.  She does report ongoing pain to the substernal area with radiation to bilateral sides of the neck, denies any additional radiation, not worsened with exertion.  Reports this as an intermittent sharp sensation and states that she was diagnosed with reflux during her visit.  Did have COVID testing prior to her surgery on 6/1 which was negative, no new COVID symptoms or COVID exposure hence COVID not suspected at this time.  Abdomen has overall improved since her discharge however she does state that she is still taking her oxycodone every 4 for pain control.  Furthermore denies any fever, cough/congestion, shortness of breath,n/v/d, dysuria or hematuria, cafl pain, unilateral or bilateral edema.  No previous history of DVTs or PEs, she is on elagolix, no active malignancy.    Per review of recent hospitalization notes, patient was also noted to have frequent PVCs and PACs during her visit.    Past Medical History:   Diagnosis Date   ??? Abnormal Pap smear of cervix 2013    per patient; biopsy was done and then everything was fine   ??? Anemia 2013   ??? Ankylosing spondylitis (CMS-HCC)    ??? Arthritis 2018   ??? Bipolar 1 disorder (CMS-HCC) 2018   ??? Depression    ??? Disease of thyroid gland    ??? Elevated blood-pressure reading without diagnosis of hypertension    ??? Headache(784.0)     more than 2-3 times a week; takes Ibuprofen. could not afford prescription medication   ??? History of pre-eclampsia in prior pregnancy, currently pregnant 11/2011   ??? 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 Hypertension    ??? Menorrhagia    ??? Morbid obesity with BMI of 50.0-59.9, adult (CMS-HCC)    ??? Obesity    ??? PTSD (post-traumatic stress disorder) 2018   ??? Sleep apnea        Patient Active Problem List   Diagnosis   ??? Mood disorder (CMS-HCC)   ??? Morbid obesity with BMI of 50.0-59.9, adult (CMS-HCC)   ??? Iron deficiency anemia   ??? Dizziness   ??? Migraine without status migrainosus, not intractable   ??? Pap smear with low grade squamous intraepithelial lesion (LGSIL)   ??? Encounter for tubal ligation   ??? Elevated blood-pressure reading without diagnosis of hypertension   ??? Vitamin D deficiency   ???  Menorrhagia   ??? High risk medication use   ??? Sacroiliitis (CMS-HCC)   ??? Immunization counseling   ??? Abnormal skin growth   ??? Ankylosing spondylitis (CMS-HCC)   ??? CRP elevated   ??? Dyslipidemia   ??? Elevated hemoglobin A1c   ??? Herpes zoster without complication   ??? Horseshoe kidney   ??? Hypertension   ??? Hypothyroidism   ??? Osteitis condensans ilii   ??? Anxiety and depression   ??? Severe bipolar I disorder, most recent episode depressed without psychotic features (CMS-HCC)   ??? Status post tubal ligation   ??? Thrombocytosis (CMS-HCC)       Past Surgical History:   Procedure Laterality Date   ??? CHOLECYSTECTOMY  12/20/2007    Initially done L/S then had some sort of complication, required endoscopy   ??? PR LAP, GAST RESTRICT PROC, LONGITUDINAL GASTRECTOMY Midline 11/19/2018    Procedure: R25 ROBOTIC PR LAP, GAST RESTRICT PROC, LONGITUDINAL GASTRECTOMY;  Surgeon: Colon Branch, MD;  Location: [facility];  Service: General Surgery   ??? PR UPPER GI ENDOSCOPY,DIAGNOSIS N/A 07/17/2016    Procedure: UGI ENDO, INCLUDE ESOPHAGUS, STOMACH, & DUODENUM &/OR JEJUNUM; DX W/WO COLLECTION SPECIMN, BY BRUSH OR WASH;  Surgeon: Annie Paras, MD;  [facility];  Service: Gastroenterology   ??? TUBAL LIGATION         No current facility-administered medications for this encounter.     Current Outpatient Medications:   ???  oxyCODONE (ROXICODONE) 5 mg/5 mL solution, Take 5 mL (5 mg total) by mouth every four (4) hours as needed for up to 5 days., Disp: 100 mL, Rfl: 0  ???  cyanocobalamin, vitamin B-12, (NASCOBAL) 500 mcg/spray Spry, 1 spray into 1 nare once a week, Disp: 4 Bottle, Rfl: 11  ???  elagolix (ORILISSA) 150 mg Tab, Take 150 mg by mouth daily., Disp: , Rfl:   ???  HUMIRA PEN CITRATE FREE 40 MG/0.4 ML, INJECT 40 MG (0.4 ML) UNDER THE SKIN EVERY 14 DAYS, Disp: 6 each, Rfl: 3  ???  lamoTRIgine (LAMICTAL) 200 MG tablet, Take 200 mg by mouth nightly. , Disp: , Rfl:   ???  levothyroxine (SYNTHROID, LEVOTHROID) 25 MCG tablet, Take 1.5 tablets (37.5 mcg total) by mouth daily. (Patient taking differently: Take 25 mcg by mouth daily. ), Disp: 90 tablet, Rfl: 3  ???  MAGOX 400 mg (241.3 mg magnesium) tablet, , Disp: , Rfl:   ???  MEDICAL SUPPLY ITEM, Please provide patient with 2 left carpal tunnel syndrome braces for wrists., Disp: 2 Units, Rfl: 0  ???  ondansetron (ZOFRAN-ODT) 4 MG disintegrating tablet, Take 1 tablet (4 mg total) by mouth every eight (8) hours as needed for nausea for up to 14 days., Disp: 20 tablet, Rfl: 0  ???  propranoloL (INDERAL) 20 MG tablet, Take 1.5 tablets (30 mg total) by mouth Two (2) times a day., Disp: 90 tablet, Rfl: 0  ???  spironolactone (ALDACTONE) 50 MG tablet, Take 50 mg by mouth Two (2) times a day., Disp: , Rfl:   ???  topiramate (TOPAMAX) 25 MG capsule, Take 2 capsules (50 mg total) by mouth daily., Disp: 60 capsule, Rfl: 0    Allergies  Patient has no known allergies.    Family History   Problem Relation Age of Onset   ??? Diabetes Maternal Grandmother    ??? Hypertension Maternal Grandmother    ??? Heart disease Maternal Grandmother    ??? Dementia Maternal Grandmother  great grandmother   ??? Heart attack Maternal Grandmother    ??? Osteoporosis Maternal Grandmother         grandfathers mother   ??? Stroke Maternal Grandmother    ??? No Known Problems Mother    ??? Hypertension Father    ??? Hypertension Sister    ??? Hypertension Brother    ??? Heart disease Maternal Grandfather    ??? Atrial fibrillation Paternal Grandmother    ??? Heart failure Paternal Grandmother    ??? Hypothyroidism Maternal Aunt    ??? Migraines Maternal Aunt    ??? Multiple sclerosis Maternal Uncle    ??? Breast cancer Neg Hx    ??? Ovarian cancer Neg Hx        Social History  Social History     Tobacco Use   ??? Smoking status: Never Smoker   ??? Smokeless tobacco: Never Used   Substance Use Topics   ??? Alcohol use: Never   ??? Drug use: Never       Review of Systems     Constitutional: Negative for fever.  Eyes: Negative for visual changes.  ENT: Negative for sore throat.  Cardiovascular: Positive for chest pain.  Respiratory: Negative for shortness of breath.  Positive for hypoxia.  Negative for cough/congestion.  Gastrointestinal: Positive for mild postsurgical abdominal pain.  Negative for nausea/vomiting or diarrhea.  Genitourinary: Negative for dysuria or hematuria.   Musculoskeletal: Negative for back pain.  Skin: Negative for rash.  Neurological: Negative for headaches, focal weakness or numbness.    All other systems have been reviewed and are negative except as otherwise documented.     Physical Exam     ED Triage Vitals   Enc Vitals Group      BP 11/22/18 1342 112/52      Heart Rate 11/22/18 1342 93      SpO2 Pulse 11/22/18 1347 86      Resp 11/22/18 1342 22      Temp 11/22/18 1342 36.3 ??C (97.3 ??F)      Temp Source 11/22/18 1342 Oral         Constitutional: Alert and oriented. Well appearing and in no distress.  Eyes: Conjunctivae are normal.  ENT       Head: Normocephalic and atraumatic.       Nose: No congestion.       Mouth/Throat: Mucous membranes are moist.       Neck: No stridor.  Cardiovascular: Normal rate, regular rhythm with frequent PVCs noted. Normal and symmetric distal pulses are present in all extremities.  Bilateral DPs 1+.  Bilateral nonpitting edema noted.  Respiratory: Normal respiratory effort. Breath sounds are normal in all lobes.  Gastrointestinal: Soft round and nondistended.  There is mild TTP to the surgical sites however no significant abnormality noted otherwise.  There is no CVA tenderness.  Genitourinary: No suprapubic TTP.  Musculoskeletal: Normal range of motion in all extremities.       Right lower leg: No tenderness.       Left lower leg: No tenderness.  Neurologic: Normal speech and language. No gross focal neurologic deficits are appreciated.  Skin: Skin is warm, dry and intact. No rash noted.  Bruises are noted surrounding the postop surgical sites without any significant erythema or increased warmth to the touch.  No purulent drainage.  Psychiatric: Mood and affect are normal. Speech and behavior are normal.     EKG     No significant changes when compared to EKG from 06/01/2016  Normal rate, regular rhythm, 78 bpm  1 focal PVC is noted  No axis deviation  Normal intervals  No ST elevation or depression from baseline     Radiology     CTA Chest W Contrast   Preliminary Result   -- No pulmonary embolism as clinically questioned.             Procedures     n/a      ______________________________________________________   Documentation assistance was provided by Margie Billet, Scribe, on November 22, 2018 at 1:52 PM for Rory Percy, NP    A scribe was used when documenting this visit. I agree with the above documentation. Signed by  Chrissie Noa on  November 22, 2018 at 2:33 PM         Chrissie Noa, FNP  11/22/18 [phone]

## 2018-12-03 MED ORDER — PROMETHAZINE 12.5 MG TABLET
ORAL_TABLET | Freq: Four times a day (QID) | ORAL | 1 refills | 0.00000 days | Status: SS | PRN
Start: 2018-12-03 — End: 2019-01-12

## 2018-12-03 MED ORDER — ONDANSETRON 4 MG DISINTEGRATING TABLET
ORAL_TABLET | Freq: Three times a day (TID) | ORAL | 0 refills | 0 days | Status: CP | PRN
Start: 2018-12-03 — End: 2018-12-23

## 2018-12-03 NOTE — Unmapped (Signed)
Addended by: Haywood Lasso on: 12/03/2018 11:27 AM     Modules accepted: Orders

## 2018-12-09 ENCOUNTER — Ambulatory Visit: Admit: 2018-12-09 | Discharge: 2018-12-10 | Payer: MEDICAID | Attending: Surgery | Primary: Surgery

## 2018-12-09 DIAGNOSIS — Z9884 Bariatric surgery status: Principal | ICD-10-CM

## 2018-12-09 LAB — COMPREHENSIVE METABOLIC PANEL
ALBUMIN: 4.7 g/dL (ref 3.5–5.0)
ALKALINE PHOSPHATASE: 79 U/L (ref 38–126)
ALT (SGPT): 65 U/L — ABNORMAL HIGH (ref <35)
AST (SGOT): 33 U/L (ref 14–38)
BILIRUBIN TOTAL: 0.6 mg/dL (ref 0.0–1.2)
BLOOD UREA NITROGEN: 5 mg/dL — ABNORMAL LOW (ref 7–21)
BUN / CREAT RATIO: 7
CHLORIDE: 105 mmol/L (ref 98–107)
CO2: 20 mmol/L — ABNORMAL LOW (ref 22.0–30.0)
CREATININE: 0.73 mg/dL (ref 0.60–1.00)
EGFR CKD-EPI AA FEMALE: >90 mL/min/{1.73_m2} (ref >=60)
EGFR CKD-EPI NON-AA FEMALE: >90 mL/min/{1.73_m2} (ref >=60)
GLUCOSE RANDOM: 92 mg/dL (ref 70–179)
POTASSIUM: 3.6 mmol/L (ref 3.5–5.0)
PROTEIN TOTAL: 7.7 g/dL (ref 6.5–8.3)
SODIUM: 142 mmol/L (ref 135–145)

## 2018-12-09 LAB — LIPASE: Triacylglycerol lipase:CCnc:Pt:Ser/Plas:Qn:: 347 — ABNORMAL HIGH

## 2018-12-09 LAB — CBC
HEMATOCRIT: 51 % — ABNORMAL HIGH (ref 36.0–46.0)
HEMOGLOBIN: 16.9 g/dL — ABNORMAL HIGH (ref 13.5–16.0)
MEAN CORPUSCULAR HEMOGLOBIN: 28.3 pg (ref 26.0–34.0)
MEAN CORPUSCULAR VOLUME: 85.4 fL (ref 80.0–100.0)
MEAN PLATELET VOLUME: 6.9 fL — ABNORMAL LOW (ref 7.0–10.0)
PLATELET COUNT: 492 10*9/L — ABNORMAL HIGH (ref 150–440)
RED BLOOD CELL COUNT: 5.98 10*12/L — ABNORMAL HIGH (ref 4.00–5.20)
RED CELL DISTRIBUTION WIDTH: 15.3 % — ABNORMAL HIGH (ref 12.0–15.0)
WBC ADJUSTED: 13.1 10*9/L — ABNORMAL HIGH (ref 4.5–11.0)

## 2018-12-09 LAB — HEMATOCRIT: Lab: 51 — ABNORMAL HIGH

## 2018-12-09 LAB — EGFR CKD-EPI NON-AA FEMALE: Lab: >90

## 2018-12-09 NOTE — Unmapped (Signed)
PRIMARY CARE PROVIDER: Smitty Cords, DO    SUBJECTIVE: Jacqueline Hall is 33 y.o. old female, who underwent laparoscopic Sleeve Gastrectomy for morbid obesity on 11/19/2018. The patient returns to clinic today for routine postoperative followup. The patient states that she is having difficulty with oral intake.  Liquids will go down but will not stay down, including water.  She is not able to tolerate her protein supplements.  She is having nausea all the time.  Her Lamictal and Zofran are ODT, phenergan is small, so she can get those down.  The Zofran and phenergan do not seem to help her nausea.     The patient is not requiring prescription pain medication. The patient's bowel movements are relatively normal, and is passing flatus.        Postoperatively, she has been slowly resuming her normal activities. In terms of exercise, she has started walking for short bouts, but  has not started a formal exercise program.      PHYSICAL EXAMINATION:     BP 119/76 (BP Site: R Arm, BP Position: Sitting, BP Cuff Size: Large)  - Pulse 97  - Temp 36.7 ??C (98.1 ??F) (Oral)  - Ht 154.9 cm (5' 0.98)  - Wt (!) 125.6 kg (277 lb)  - SpO2 96%  - BMI 52.37 kg/m??     Wt Readings from Last 12 Encounters:   12/09/18 (!) 125.6 kg (277 lb)   11/19/18 (!) 136.3 kg (300 lb 7 oz)   11/11/18 (!) 137.7 kg (303 lb 8 oz)   09/26/18 (!) 140.9 kg (310 lb 9.6 oz)   08/26/18 (!) 141.8 kg (312 lb 11.2 oz)   08/01/18 (!) 142.6 kg (314 lb 6.4 oz)   06/20/18 (!) 147.3 kg (324 lb 12.8 oz)   06/12/18 (!) 147.1 kg (324 lb 6.4 oz)   05/12/18 (!) 145.3 kg (320 lb 6.4 oz)   04/29/18 (!) 146 kg (321 lb 12.8 oz)   04/11/18 (!) 145.7 kg (321 lb 3.2 oz)   03/07/18 (!) 144.7 kg (319 lb 0.1 oz)       ABDOMEN:  Directed examination of the abdomen reveals the incisions are healing well with no redness or drainage.     PATHOLOGY:  The pathology report was reviewed with the patient, it showed:    Stomach, sleeve gastrectomy  - Portion of stomach lined by oxyntic mucosa with no specific pathologic abnormality  - No Helicobacter identified on H&E stain    IMPRESSION:  Jacqueline Hall is 33 y.o. old female, who underwent laparoscopic Sleeve Gastrectomy for morbid obesity on 11/19/2018, and is over all recovering well.     PLAN: Given the patient's inability to tolerate liquids and her heart rate in the 90s, I recommended that she undergo laboratory examination with a check of her CBC and comprehensive metabolic panel.  I encouraged her to continue to sip liquids throughout the day.  Reviewed her upper GI done on postoperative day 1, which showed free flow of contrast from the esophagus to the the proximal small bowel.  It is reasonable to repeat her upper GI to be sure there is no obstruction of flow; we will move up the date of her upper GI.    D. Remigio Eisenmenger, MD  12/09/2018  3:04 PM     Addendum:    Lab Results   Component Value Date    WBC 13.1 (H) 12/09/2018    HGB 16.9 (H) 12/09/2018    HCT 51.0 (H)  12/09/2018    PLT 492 (H) 12/09/2018       Lab Results   Component Value Date    NA 142 12/09/2018    K 3.6 12/09/2018    CL 105 12/09/2018    CO2 20.0 (L) 12/09/2018    BUN 5 (L) 12/09/2018    CREATININE 0.73 12/09/2018    GLU 92 12/09/2018    CALCIUM 10.1 12/09/2018    MG 1.9 11/20/2018    PHOS 3.3 11/20/2018       Lab Results   Component Value Date    BILITOT 0.6 12/09/2018    PROT 7.7 12/09/2018    ALBUMIN 4.7 12/09/2018    ALT 65 (H) 12/09/2018    AST 33 12/09/2018    ALKPHOS 79 12/09/2018       Lab Results   Component Value Date    INR 1.25 11/22/2018    APTT 32.9 11/22/2018     There is no clear laboratory evidence of dehydration, no prerenal azotemia, although her hematocrit is increased.  She should continue to drink continuously throughout the day.  Her upper GI will be tomorrow.

## 2018-12-09 NOTE — Unmapped (Signed)
Patient returns for postop visit. She had a sleeve gastrectomy on 11/19/18. Initially, patient called after the surgery with complaints of constipation. About a week after the surgery, she was able to have a bowel movement. Since that time, she has been having nausea. She has been in contact with [name] and was given a script for Zofran and Phenergan. She does not feel that either of these have been helpful in treating the nausea. She also reports that she has not been urinating very much. She reports that she drinks 40 oz of water on average. She vomits 3x/day on average. She can't even keep water or gatorade down at this point. She says I don't even know how I am living. Patient is tearful.   Swallow study has been ordered and is scheduled for 12/18/18.

## 2018-12-10 ENCOUNTER — Ambulatory Visit: Admit: 2018-12-10 | Discharge: 2018-12-11 | Payer: MEDICAID

## 2018-12-10 DIAGNOSIS — R1110 Vomiting, unspecified: Principal | ICD-10-CM

## 2018-12-10 DIAGNOSIS — Z903 Acquired absence of stomach [part of]: Secondary | ICD-10-CM

## 2018-12-10 DIAGNOSIS — R1111 Vomiting without nausea: Secondary | ICD-10-CM | POA: Diagnosis not present

## 2018-12-10 DIAGNOSIS — R1310 Dysphagia, unspecified: Secondary | ICD-10-CM | POA: Diagnosis not present

## 2018-12-10 DIAGNOSIS — Z9884 Bariatric surgery status: Secondary | ICD-10-CM | POA: Diagnosis not present

## 2018-12-10 DIAGNOSIS — R110 Nausea: Secondary | ICD-10-CM | POA: Diagnosis not present

## 2018-12-10 LAB — H. PYLORI ANTIBODY: Lab: NEGATIVE

## 2018-12-11 NOTE — Unmapped (Signed)
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

## 2018-12-13 ENCOUNTER — Ambulatory Visit: Admit: 2018-12-13 | Discharge: 2018-12-14 | Payer: MEDICAID

## 2018-12-13 DIAGNOSIS — Z1159 Encounter for screening for other viral diseases: Principal | ICD-10-CM

## 2018-12-13 NOTE — Unmapped (Signed)
COVID Pre-Procedure Intake Form     Assessment     Jacqueline Hall is a 33 y.o. female presenting to [facility] for COVID testing prior to procedure .     Plan     COVID swab obtained.  Reviewed with patient importance of staying home until procedure and limiting exposure.      Subjective     Jacqueline Hall is a 33 y.o. female who [facility] the [facility] with complaints of the following:       Are you scheduled to have surgery in the next 3 days?  No   Are you scheduled to have a procedure like a colonoscopy or endoscopy in the next 3 days?  Yes       Have you ever been tested for COVID-19 with a swab of your nose?    Yes: When: November 17 2018 (confirmed in chart), Where: [facility]           In the last 14 days?     Have you traveled outside of [place]? No   Have you been in close contact with someone confirmed by a test to have COVID? (Close contact is within 6 feet for at least 10 minutes) No         Worked in a health care facility?   No           Symptoms (as reported by patient):  Do you currently have any of the following:  Subjective fever (felt feverish) No   Chills (especially repeated shaking chills) No   Severe fatigue (felt very tired)  No   Muscle aches No   Runny nose No   Sore Throat No   Loss of taste or smell No   Cough (new onset or worsening of chronic cough) No   Shortness of breath No   Nausea or vomiting No   Headache No   Abdominal pain No   Diarrhea (3 or more loose stools in last 24 hours) No     Objective     Testing Performed:  Test Specimen Type Sent to   COVID-19  NP Swab [place] [facility]       Scribe's Attestation: Norvel Richards, MD obtained and performed the history, physical exam and medical decision making elements that were entered into the chart.  Signed by Mal Amabile, LCSW serving as Scribe, on 12/13/2018 9:57 AM

## 2018-12-16 ENCOUNTER — Encounter
Admit: 2018-12-16 | Discharge: 2018-12-16 | Payer: MEDICAID | Attending: Nurse Practitioner | Primary: Nurse Practitioner

## 2018-12-16 ENCOUNTER — Ambulatory Visit: Admit: 2018-12-16 | Discharge: 2018-12-16 | Payer: MEDICAID

## 2018-12-16 DIAGNOSIS — R112 Nausea with vomiting, unspecified: Principal | ICD-10-CM

## 2018-12-16 DIAGNOSIS — F4310 Post-traumatic stress disorder, unspecified: Secondary | ICD-10-CM | POA: Diagnosis not present

## 2018-12-16 DIAGNOSIS — M1990 Unspecified osteoarthritis, unspecified site: Secondary | ICD-10-CM | POA: Diagnosis not present

## 2018-12-16 DIAGNOSIS — G4730 Sleep apnea, unspecified: Secondary | ICD-10-CM | POA: Diagnosis not present

## 2018-12-16 DIAGNOSIS — Z9989 Dependence on other enabling machines and devices: Secondary | ICD-10-CM | POA: Diagnosis not present

## 2018-12-16 DIAGNOSIS — E039 Hypothyroidism, unspecified: Secondary | ICD-10-CM | POA: Diagnosis not present

## 2018-12-16 DIAGNOSIS — K449 Diaphragmatic hernia without obstruction or gangrene: Secondary | ICD-10-CM | POA: Diagnosis not present

## 2018-12-16 DIAGNOSIS — K3189 Other diseases of stomach and duodenum: Secondary | ICD-10-CM | POA: Diagnosis not present

## 2018-12-16 DIAGNOSIS — E079 Disorder of thyroid, unspecified: Secondary | ICD-10-CM | POA: Diagnosis not present

## 2018-12-16 DIAGNOSIS — M459 Ankylosing spondylitis of unspecified sites in spine: Secondary | ICD-10-CM | POA: Diagnosis not present

## 2018-12-16 DIAGNOSIS — Z9884 Bariatric surgery status: Secondary | ICD-10-CM | POA: Diagnosis not present

## 2018-12-16 DIAGNOSIS — K219 Gastro-esophageal reflux disease without esophagitis: Secondary | ICD-10-CM | POA: Diagnosis not present

## 2018-12-16 DIAGNOSIS — F319 Bipolar disorder, unspecified: Secondary | ICD-10-CM | POA: Diagnosis not present

## 2018-12-16 DIAGNOSIS — I10 Essential (primary) hypertension: Secondary | ICD-10-CM | POA: Diagnosis not present

## 2018-12-16 DIAGNOSIS — Z9049 Acquired absence of other specified parts of digestive tract: Secondary | ICD-10-CM | POA: Diagnosis not present

## 2018-12-16 DIAGNOSIS — Z6841 Body Mass Index (BMI) 40.0 and over, adult: Secondary | ICD-10-CM | POA: Diagnosis not present

## 2018-12-16 MED ORDER — ONDANSETRON 4 MG DISINTEGRATING TABLET
ORAL_TABLET | Freq: Three times a day (TID) | ORAL | 0 refills | 0 days | Status: CP | PRN
Start: 2018-12-16 — End: 2018-12-23

## 2018-12-23 ENCOUNTER — Ambulatory Visit: Admit: 2018-12-23 | Discharge: 2018-12-24 | Payer: MEDICAID | Attending: Surgery | Primary: Surgery

## 2018-12-23 DIAGNOSIS — R112 Nausea with vomiting, unspecified: Secondary | ICD-10-CM

## 2018-12-23 DIAGNOSIS — Z903 Acquired absence of stomach [part of]: Principal | ICD-10-CM

## 2018-12-23 MED ORDER — MIRTAZAPINE 15 MG TABLET
ORAL_TABLET | 1 refills | 0 days | Status: CP
Start: 2018-12-23 — End: 2019-01-14

## 2018-12-23 MED ORDER — ONDANSETRON 4 MG DISINTEGRATING TABLET
ORAL_TABLET | Freq: Three times a day (TID) | ORAL | 0 refills | 0 days | Status: CP | PRN
Start: 2018-12-23 — End: 2019-01-14

## 2018-12-23 MED ORDER — PROMETHAZINE 12.5 MG TABLET
ORAL_TABLET | Freq: Three times a day (TID) | ORAL | 0 refills | 0 days | Status: CP
Start: 2018-12-23 — End: 2019-01-22

## 2018-12-23 MED ORDER — OLANZAPINE 5 MG TABLET
ORAL_TABLET | Freq: Every evening | ORAL | 0 refills | 0 days | Status: CP
Start: 2018-12-23 — End: 2019-01-14

## 2018-12-23 NOTE — Unmapped (Signed)
Patient reports that she vomits every three hours or so. This is not associated with eating.

## 2018-12-23 NOTE — Unmapped (Signed)
Patient Name: Jacqueline Hall  Medical Record Number: [id number]  Date of Service: 12/23/2018    Referring Provider: Ayesha Rumpf, NP.     Primary Provider: Smitty Cords, DO.    Chief Complaint: Jacqueline Hall is 33 y.o. female who underwent sleeve gastrectomy for morbid obesity on 11/19/2018, who returns to clinic today for evaluation of vomiting after oral intake.    History of Present Illness: Jacqueline Hall is 33 y.o. female who underwent sleeve gastrectomy for morbid obesity on 11/19/2018, who returns to clinic today for evaluation of vomiting after oral intake.      She was last seen in clinic on 12/09/2018.  At that time she stated she had difficulty with liquids staying down, including water.  She reported she was not able to tolerate her protein supplement.  She reported having nausea all the time.    She reports nausea that never goes away.  She takes Zofran, which does not help.  She states that she is only able to drink 24 ounces of fluid per day.  She drinks a few sips of fluid per hour.  She does not vomit every time she drinks, and if she does vomit after drinking, it is 30-40 minutes after drinking  She does not report difficulty swallowing, just vomiting after drinking.  She is not sure if the liquid she drinks fails to go down, but she does not have chest pain/pressure with drinking. She reports dizziness, weakness, and an inability to do self-care activities such as showering.    She reports having pregnancy associated nausea that lasted all day with her son (first pregnancy, but not her daughter, second pregnacy), 4 months at the beginning of pregnancy, and two months at the end, however, Zofran helped, and she was able to continue working.      She reports constipation with her last bowel movement being 28 June; she has not tried any kind of bowel program.  She reports having flatus.  She reports lower left lower abdominal pain.    She has tried a very small amount cream of chicken soup, which went down, with no nausea after. The milk went down; she vomited after, but the vomitus was clear, not milk.     She had EGD with dilation, which did not help her symptoms at all.    The patient underwent upper GI on 12/10/2018, images reviewed by me in clinic, which showed:    FINDINGS:   Scout film shows nonobstructive bowel gas pattern. No abnormal soft tissue masses or calcifications noted. Cholecystectomy clips in the right upper quadrant. Visualized lung bases are unremarkable.  ??  The mid/distal esophageal mucosa appeared smooth, with no ulcerations, strictures or filling defects.    ??  Sequelae of sleeve gastrectomy. There is mild to moderate narrowing in the midportion of the gastric sleeve with mildly delayed transit of contrast through this narrowed portion. There is associated mild dilatation of the proximal gastric sleeve. No evidence of contrast extravasation. Contrast readily drained from the gastric sleeve into the nondilated proximal small bowel.  ??  Post procedure radiograph demonstrates residual contrast within the stomach and proximal small bowel. The remainder of the partially included abdomen, including the visceral contours, bones and soft tissues was normal.   ??  IMPRESSION:    - Mild to moderate narrowing of gastric sleeve at the level of mid gastric body. This may represent an area of stricture versus postsurgical changes. Clinical correlation is recommended. If there is  high clinical concern, further evaluation with endoscopy could be considered.  - No evidence of contrast extravasation to suggest leak.  - No evidence of gastric outlet obstruction.  ??  She underwent upper endoscopy on 12/16/2018, which showed:    - Normal esophagus.  - Small hiatal hernia.  - A sleeve gastrectomy were found, characterized by an intact appearance.  - Post-surgical deformity in the gastric body. Dilated to 20 mm.  - Normal examined duodenum.  - No specimens collected.    Most recent laboratory examination is presented below:    Lab Results   Component Value Date    WBC 13.1 (H) 12/09/2018    HGB 16.9 (H) 12/09/2018    HCT 51.0 (H) 12/09/2018    PLT 492 (H) 12/09/2018       Lab Results   Component Value Date    NA 142 12/09/2018    K 3.6 12/09/2018    CL 105 12/09/2018    CO2 20.0 (L) 12/09/2018    BUN 5 (L) 12/09/2018    CREATININE 0.73 12/09/2018    GLU 92 12/09/2018    CALCIUM 10.1 12/09/2018    MG 1.9 11/20/2018    PHOS 3.3 11/20/2018       Lab Results   Component Value Date    BILITOT 0.6 12/09/2018    PROT 7.7 12/09/2018    ALBUMIN 4.7 12/09/2018    ALT 65 (H) 12/09/2018    AST 33 12/09/2018    ALKPHOS 79 12/09/2018       Lab Results   Component Value Date    PT 14.4 (H) 11/22/2018    INR 1.25 11/22/2018    APTT 32.9 11/22/2018         Past Medical History:  Past Medical History:   Diagnosis Date   ??? Abnormal Pap smear of cervix 2013    per patient; biopsy was done and then everything was fine   ??? Anemia 2013   ??? Ankylosing spondylitis (CMS-HCC)    ??? Arthritis 2018   ??? Bipolar 1 disorder (CMS-HCC) 2018   ??? Depression    ??? Disease of thyroid gland    ??? Elevated blood-pressure reading without diagnosis of hypertension    ??? Headache(784.0)     more than 2-3 times a week; takes Ibuprofen. could not afford prescription medication   ??? History of pre-eclampsia in prior pregnancy, currently pregnant 11/2011   ??? 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 Hypertension    ??? Menorrhagia    ??? Morbid obesity with BMI of 50.0-59.9, adult (CMS-HCC)    ??? Obesity    ??? PTSD (post-traumatic stress disorder) 2018   ??? Sleep apnea        Past Surgical History:   Past Surgical History:   Procedure Laterality Date   ??? CHOLECYSTECTOMY  12/20/2007    Initially done L/S then had some sort of complication, required endoscopy   ??? PR LAP, GAST RESTRICT PROC, LONGITUDINAL GASTRECTOMY Midline 11/19/2018    Procedure: R25 ROBOTIC PR LAP, GAST RESTRICT PROC, LONGITUDINAL GASTRECTOMY;  Surgeon: Colon Branch, MD;  [facility]: [facility];  Service: General Surgery   ??? PR UPPER GI ENDOSCOPY,DIAGNOSIS N/A 07/17/2016    Procedure: UGI ENDO, INCLUDE ESOPHAGUS, STOMACH, & DUODENUM &/OR JEJUNUM; DX W/WO COLLECTION SPECIMN, BY BRUSH OR WASH;  Surgeon: Annie Paras, MD;  [facility];  Service: Gastroenterology   ??? PR UPPER GI ENDOSCOPY,W/DILAT,GASTRIC OUT N/A 12/16/2018    Procedure: UGI ENDO;  W/DILATION GASTRIC OUTLET FOR OBSTRUCTION;  Surgeon: Chriss Driver, MD;  [facility]: [facility];  Service: Gastroenterology   ??? TUBAL LIGATION         Medications:   Current Outpatient Medications   Medication Sig Dispense Refill   ??? cyanocobalamin, vitamin B-12, (NASCOBAL) 500 mcg/spray Spry 1 spray into 1 nare once a week 4 Bottle 11   ??? elagolix (ORILISSA) 150 mg Tab Take 150 mg by mouth daily.     ??? HUMIRA PEN CITRATE FREE 40 MG/0.4 ML INJECT 40 MG (0.4 ML) UNDER THE SKIN EVERY 14 DAYS 6 each 3   ??? lamoTRIgine (LAMICTAL) 200 MG tablet Take 200 mg by mouth nightly.      ??? levothyroxine (SYNTHROID, LEVOTHROID) 25 MCG tablet Take 1.5 tablets (37.5 mcg total) by mouth daily. (Patient taking differently: Take 25 mcg by mouth daily. ) 90 tablet 3   ??? MAGOX 400 mg (241.3 mg magnesium) tablet      ??? MEDICAL SUPPLY ITEM Please provide patient with 2 left carpal tunnel syndrome braces for wrists. 2 Units 0   ??? ondansetron (ZOFRAN-ODT) 4 MG disintegrating tablet Take 1 tablet (4 mg total) by mouth every eight (8) hours as needed for nausea. 90 tablet 0   ??? promethazine (PHENERGAN) 12.5 MG tablet Take 1 tablet (12.5 mg total) by mouth every six (6) hours as needed for nausea. 30 tablet 1   ??? propranoloL (INDERAL) 20 MG tablet Take 1.5 tablets (30 mg total) by mouth Two (2) times a day. 90 tablet 0   ??? spironolactone (ALDACTONE) 50 MG tablet Take 50 mg by mouth Two (2) times a day.     ??? topiramate (TOPAMAX) 25 MG capsule Take 2 capsules (50 mg total) by mouth daily. 60 capsule 0     No current facility-administered medications for this visit.        Allergies:  No Known Allergies    Social History:  Social History     Socioeconomic History   ??? Marital status: Married     Spouse name: Not on file   ??? Number of children: 2   ??? Years of education: Not on file   ??? Highest education level: Not on file   Occupational History   ??? Occupation: Unemployed   Social Needs   ??? Financial resource strain: Not on file   ??? Food insecurity     Worry: Not on file     Inability: Not on file   ??? Transportation needs     Medical: Not on file     Non-medical: Not on file   Tobacco Use   ??? Smoking status: Never Smoker   ??? Smokeless tobacco: Never Used   Substance and Sexual Activity   ??? Alcohol use: Never   ??? Drug use: Never   ??? Sexual activity: Yes     Partners: Male     Birth control/protection: Surgical   Lifestyle   ??? Physical activity     Days per week: Not on file     Minutes per session: Not on file   ??? Stress: Not on file   Relationships   ??? Social Wellsite geologist on phone: Not on file     Gets together: Not on file     Attends religious service: Not on file     Active member of club or organization: Not on file     Attends meetings of clubs or organizations: Not on  file     Relationship status: Not on file   Other Topics Concern   ??? Exercise No   ??? Living Situation Yes     Comment: ?   Social History Narrative    Married 2 children, 1 stepchild.    Previously worked at [employer] in [employer], currently (09/05/2016) unemployed.    Lives in [place].    Has tubal ligation.        Family History:   Family History   Problem Relation Age of Onset   ??? Diabetes Maternal Grandmother    ??? Hypertension Maternal Grandmother    ??? Heart disease Maternal Grandmother    ??? Dementia Maternal Grandmother         great grandmother   ??? Heart attack Maternal Grandmother    ??? Osteoporosis Maternal Grandmother         grandfathers mother   ??? Stroke Maternal Grandmother    ??? No Known Problems Mother    ??? Hypertension Father    ??? Hypertension Sister    ??? Hypertension Brother    ??? Heart disease Maternal Grandfather    ??? Atrial fibrillation Paternal Grandmother    ??? Heart failure Paternal Grandmother    ??? Hypothyroidism Maternal Aunt    ??? Migraines Maternal Aunt    ??? Multiple sclerosis Maternal Uncle    ??? Breast cancer Neg Hx    ??? Ovarian cancer Neg Hx        Review of systems:   A 10-system review of systems was completed with the exception of those positives mentioned in history of present illness and past medical history is negative.      Physical examination:     BP 124/66 (BP Site: R Arm, BP Position: Sitting, BP Cuff Size: Large)  - Pulse 112  - Temp 36.4 ??C (97.5 ??F) (Oral)  - Ht 154.9 cm (5' 0.98)  - Wt (!) 121.6 kg (268 lb 1.6 oz)  - SpO2 94%  - BMI 50.68 kg/m??       HR slowed to 90 after lying still on the exam table.    Wt Readings from Last 6 Encounters:   12/23/18 (!) 121.6 kg (268 lb 1.6 oz)   12/16/18 (!) 125.6 kg (277 lb)   12/09/18 (!) 125.6 kg (277 lb)   11/19/18 (!) 136.3 kg (300 lb 7 oz)   11/11/18 (!) 137.7 kg (303 lb 8 oz)   09/26/18 (!) 140.9 kg (310 lb 9.6 oz)       CONSTITUTIONAL: This is an uncomfortable-appearing female in no acute distress, whose appearance is consistent with stated age.   EYES: Anicteric. Extraocular movements are grossly intact.   EAR, NOSE, MOUTH AND THROAT: Mucous membranes moist. Trachea midline   HEART/CARDIOVASCULAR:  Sinus rhythm.  On recheck of her heart rate, she was in the normal sinus rhythm without tachycardia  CHEST/PULMONARY: Clear to auscultation. Normal work of breathing on room air.   ABDOMEN/GASTROINTESTINAL: Soft, nontender and nondistended.   SKIN: Warm and well perfused without cyanosis or edema.   NEUROLOGIC: Alert and oriented x3. Sensory and motor grossly intact.  LYMPHATICS: No palpable lymphadenopathy.     Impression:  This is a 33 y.o. female with morbid obesity status post sleeve gastrectomy on 11/19/2018, with continued vomiting with oral intake, with a mild narrowing seen on upper GI and question of anatomic deformity seen with upper endoscopy.     Plan: Her symptoms sound more consistent with postoperative nausea and vomiting and  less consistent with obstruction.  The patient understands that severe long-lasting nausea is a potential side effect of sleeve gastrectomy.    She needs to continue to focus on adequate fluid intake, 64 ounces total per day, and adequate protein intake, 60-80 g/day.    We will plan to begin mirtazapine 15 mg nightly for 1 week increasing to 30 mg nightly.  We will schedule Zofran 4 mg oral dissolvable tablet every 8 hours.  We will begin promethazine as needed for episodes of nausea not controlled by scheduled mirtazapine and Zofran.  We will use Zyprexa 5 mg nightly if she continues to have nausea breaking through the above regimen.     For constipation, I recommended fleets enema.  She should use milk of magnesia or magnesium citrate along with MiraLAX twice daily until she begins having regular bowel movements.  He can then stop milk of magnesia and go to MiraLAX once daily.  She understands MiraLAX will require adequate oral intake of water to work appropriately.    The patient will be in touch with [place] over the next few days for [place] know how she is progressing.    Berton Mount, MD  12/23/2018  9:49 AM

## 2018-12-25 MED ORDER — ESOMEPRAZOLE MAGNESIUM 40 MG CAPSULE,DELAYED RELEASE
ORAL_CAPSULE | Freq: Every day | ORAL | 5 refills | 0 days | Status: CP
Start: 2018-12-25 — End: 2019-06-23

## 2018-12-25 MED ORDER — SUCRALFATE 100 MG/ML ORAL SUSPENSION
Freq: Four times a day (QID) | ORAL | 1 refills | 0.00000 days | Status: CP
Start: 2018-12-25 — End: 2019-01-14

## 2018-12-30 ENCOUNTER — Telehealth: Payer: Self-pay

## 2018-12-30 NOTE — Telephone Encounter (Signed)
Pt called for prescreening and husband asked stating that the pt was sick and was having post surgery and she had to reschedule.

## 2018-12-31 ENCOUNTER — Telehealth: Payer: Self-pay

## 2018-12-31 ENCOUNTER — Encounter: Payer: Self-pay | Admitting: Obstetrics and Gynecology

## 2018-12-31 NOTE — Telephone Encounter (Signed)
See patient as soon as scheduled this week for further eval, likely will still need input from her bariatric surgeon however  Nobie Putnam, [facility] 12/31/2018, 1:14 PM

## 2018-12-31 NOTE — Telephone Encounter (Signed)
The pt called requesting an appt today for vomiting, nausea post gastric bypass surgery x 6 weeks ago. The pt complains of not being able to keep anything on her stomach. She states that she f/u with her surgeon several times post surgery and he states that she is fine with no symptoms of dehydrations. She is requesting to be scheduled for a second opinion by her PCP. Please advise

## 2019-01-01 ENCOUNTER — Ambulatory Visit (INDEPENDENT_AMBULATORY_CARE_PROVIDER_SITE_OTHER): Payer: BC Managed Care – PPO | Admitting: Family Medicine

## 2019-01-01 ENCOUNTER — Observation Stay
Admission: EM | Admit: 2019-01-01 | Discharge: 2019-01-02 | Disposition: A | Discharge status: Home / Self Care | Payer: BC Managed Care – PPO | Attending: Internal Medicine | Admitting: Internal Medicine

## 2019-01-01 ENCOUNTER — Emergency Department: Payer: BC Managed Care – PPO

## 2019-01-01 ENCOUNTER — Encounter: Payer: Self-pay | Admitting: Family Medicine

## 2019-01-01 ENCOUNTER — Other Ambulatory Visit: Payer: Self-pay

## 2019-01-01 DIAGNOSIS — Z9884 Bariatric surgery status: Secondary | ICD-10-CM | POA: Diagnosis not present

## 2019-01-01 DIAGNOSIS — F319 Bipolar disorder, unspecified: Secondary | ICD-10-CM | POA: Diagnosis not present

## 2019-01-01 DIAGNOSIS — Z8249 Family history of ischemic heart disease and other diseases of the circulatory system: Secondary | ICD-10-CM | POA: Insufficient documentation

## 2019-01-01 DIAGNOSIS — Z9049 Acquired absence of other specified parts of digestive tract: Secondary | ICD-10-CM | POA: Diagnosis not present

## 2019-01-01 DIAGNOSIS — E860 Dehydration: Secondary | ICD-10-CM | POA: Insufficient documentation

## 2019-01-01 DIAGNOSIS — F4310 Post-traumatic stress disorder, unspecified: Secondary | ICD-10-CM | POA: Diagnosis not present

## 2019-01-01 DIAGNOSIS — T381X6A Underdosing of thyroid hormones and substitutes, initial encounter: Secondary | ICD-10-CM | POA: Diagnosis not present

## 2019-01-01 DIAGNOSIS — Z1159 Encounter for screening for other viral diseases: Secondary | ICD-10-CM | POA: Insufficient documentation

## 2019-01-01 DIAGNOSIS — E785 Hyperlipidemia, unspecified: Secondary | ICD-10-CM | POA: Diagnosis not present

## 2019-01-01 DIAGNOSIS — R112 Nausea with vomiting, unspecified: Secondary | ICD-10-CM

## 2019-01-01 DIAGNOSIS — Z03818 Encounter for observation for suspected exposure to other biological agents ruled out: Secondary | ICD-10-CM | POA: Diagnosis not present

## 2019-01-01 DIAGNOSIS — N200 Calculus of kidney: Secondary | ICD-10-CM | POA: Insufficient documentation

## 2019-01-01 DIAGNOSIS — Z0184 Encounter for antibody response examination: Secondary | ICD-10-CM | POA: Diagnosis not present

## 2019-01-01 DIAGNOSIS — M069 Rheumatoid arthritis, unspecified: Secondary | ICD-10-CM | POA: Insufficient documentation

## 2019-01-01 DIAGNOSIS — Z79899 Other long term (current) drug therapy: Secondary | ICD-10-CM | POA: Insufficient documentation

## 2019-01-01 DIAGNOSIS — I10 Essential (primary) hypertension: Secondary | ICD-10-CM | POA: Insufficient documentation

## 2019-01-01 DIAGNOSIS — Z791 Long term (current) use of non-steroidal anti-inflammatories (NSAID): Secondary | ICD-10-CM | POA: Diagnosis not present

## 2019-01-01 DIAGNOSIS — R1110 Vomiting, unspecified: Secondary | ICD-10-CM | POA: Diagnosis not present

## 2019-01-01 DIAGNOSIS — E876 Hypokalemia: Secondary | ICD-10-CM | POA: Diagnosis not present

## 2019-01-01 DIAGNOSIS — E039 Hypothyroidism, unspecified: Secondary | ICD-10-CM | POA: Insufficient documentation

## 2019-01-01 DIAGNOSIS — Z79890 Hormone replacement therapy (postmenopausal): Secondary | ICD-10-CM | POA: Insufficient documentation

## 2019-01-01 LAB — CBC WITH DIFFERENTIAL/PLATELET
Abs Immature Granulocytes: 0.03 10*3/uL (ref 0.00–0.07)
Basophils Absolute: 0 10*3/uL (ref 0.0–0.1)
Basophils Relative: 0 %
Eosinophils Absolute: 0.1 10*3/uL (ref 0.0–0.5)
Eosinophils Relative: 1 %
HCT: 49.1 % — ABNORMAL HIGH (ref 36.0–46.0)
Hemoglobin: 16.5 g/dL — ABNORMAL HIGH (ref 12.0–15.0)
Immature Granulocytes: 0 %
Lymphocytes Relative: 16 %
Lymphs Abs: 1.6 10*3/uL (ref 0.7–4.0)
MCH: 28.1 pg (ref 26.0–34.0)
MCHC: 33.6 g/dL (ref 30.0–36.0)
MCV: 83.6 fL (ref 80.0–100.0)
Monocytes Absolute: 1.2 10*3/uL — ABNORMAL HIGH (ref 0.1–1.0)
Monocytes Relative: 12 %
Neutro Abs: 7 10*3/uL (ref 1.7–7.7)
Neutrophils Relative %: 71 %
Platelets: 352 10*3/uL (ref 150–400)
RBC: 5.87 MIL/uL — ABNORMAL HIGH (ref 3.87–5.11)
RDW: 14.5 % (ref 11.5–15.5)
WBC: 9.9 10*3/uL (ref 4.0–10.5)
nRBC: 0 % (ref 0.0–0.2)

## 2019-01-01 LAB — COMPREHENSIVE METABOLIC PANEL
ALT: 27 U/L (ref 0–44)
AST: 28 U/L (ref 15–41)
Albumin: 3.8 g/dL (ref 3.5–5.0)
Alkaline Phosphatase: 61 U/L (ref 38–126)
Anion gap: 18 — ABNORMAL HIGH (ref 5–15)
BUN: 15 mg/dL (ref 6–20)
CO2: 27 mmol/L (ref 22–32)
Calcium: 9 mg/dL (ref 8.9–10.3)
Chloride: 91 mmol/L — ABNORMAL LOW (ref 98–111)
Creatinine, Ser: 0.93 mg/dL (ref 0.44–1.00)
GFR calc Af Amer: >60 mL/min (ref >60)
GFR calc non Af Amer: >60 mL/min (ref >60)
Glucose, Bld: 108 mg/dL — ABNORMAL HIGH (ref 70–99)
Potassium: 2.8 mmol/L — ABNORMAL LOW (ref 3.5–5.1)
Sodium: 136 mmol/L (ref 135–145)
Total Bilirubin: 1.7 mg/dL — ABNORMAL HIGH (ref 0.3–1.2)
Total Protein: 6.8 g/dL (ref 6.5–8.1)

## 2019-01-01 LAB — URINALYSIS, ROUTINE W REFLEX MICROSCOPIC
Glucose, UA: NEGATIVE mg/dL
Ketones, ur: 80 mg/dL — AB
Nitrite: NEGATIVE
Protein, ur: 100 mg/dL — AB
Specific Gravity, Urine: 1.027 (ref 1.005–1.030)
pH: 6 (ref 5.0–8.0)

## 2019-01-01 LAB — URINALYSIS, COMPLETE (UACMP) WITH MICROSCOPIC
Glucose, UA: NEGATIVE mg/dL
Ketones, ur: 80 mg/dL — AB
Nitrite: NEGATIVE
Protein, ur: 100 mg/dL — AB
RBC / HPF: >50 RBC/hpf (ref 0–5)
Specific Gravity, Urine: 1.027 (ref 1.005–1.030)
pH: 6 (ref 5.0–8.0)

## 2019-01-01 LAB — T4, FREE: Free T4: 1.01 ng/dL (ref 0.61–1.12)

## 2019-01-01 LAB — MAGNESIUM: Magnesium: 2 mg/dL (ref 1.7–2.4)

## 2019-01-01 LAB — TSH: TSH: 3.225 u[IU]/mL (ref 0.350–4.500)

## 2019-01-01 LAB — SARS CORONAVIRUS 2 BY RT PCR (HOSPITAL ORDER, PERFORMED IN ~~LOC~~ HOSPITAL LAB): SARS Coronavirus 2: NEGATIVE

## 2019-01-01 LAB — LIPASE, BLOOD: Lipase: 98 U/L — ABNORMAL HIGH (ref 11–51)

## 2019-01-01 MED ORDER — ONDANSETRON HCL 4 MG PO TABS: 4.0000 mg | ORAL_TABLET | Freq: Four times a day (QID) | ORAL | Status: DC | PRN

## 2019-01-01 MED ORDER — LAMOTRIGINE 100 MG PO TABS
200.0000 mg | ORAL_TABLET | Freq: Every day | ORAL | Status: DC
  Administered 2019-01-01 – 2019-01-02 (×2): 200 mg via ORAL
  Filled 2019-01-01 (×2): qty 2

## 2019-01-01 MED ORDER — ONDANSETRON 8 MG PO TBDP: 8.0000 mg | ORAL_TABLET | Freq: Three times a day (TID) | ORAL | 0 refills | Status: DC | PRN

## 2019-01-01 MED ORDER — ONDANSETRON HCL 4 MG/2ML IJ SOLN
4.0000 mg | Freq: Four times a day (QID) | INTRAMUSCULAR | Status: DC | PRN
  Administered 2019-01-01 – 2019-01-02 (×3): 4 mg via INTRAVENOUS
  Filled 2019-01-01 (×3): qty 2

## 2019-01-01 MED ORDER — ACETAMINOPHEN 650 MG RE SUPP: 650.0000 mg | Freq: Four times a day (QID) | RECTAL | Status: DC | PRN

## 2019-01-01 MED ORDER — ENOXAPARIN SODIUM 40 MG/0.4ML ~~LOC~~ SOLN
40.0000 mg | SUBCUTANEOUS | Status: DC
  Administered 2019-01-01: 40 mg via SUBCUTANEOUS
  Filled 2019-01-01: qty 0.4

## 2019-01-01 MED ORDER — MORPHINE SULFATE (PF) 2 MG/ML IV SOLN: 2.0000 mg | INTRAVENOUS | Status: DC | PRN

## 2019-01-01 MED ORDER — POTASSIUM CHLORIDE 10 MEQ/100ML IV SOLN
10.0000 meq | Freq: Once | INTRAVENOUS | Status: AC
  Administered 2019-01-01: 10 meq via INTRAVENOUS
  Filled 2019-01-01: qty 100

## 2019-01-01 MED ORDER — TOPIRAMATE 25 MG PO TABS
50.0000 mg | ORAL_TABLET | Freq: Every day | ORAL | Status: DC
  Filled 2019-01-01: qty 2

## 2019-01-01 MED ORDER — ACETAMINOPHEN 325 MG PO TABS: 650.0000 mg | ORAL_TABLET | Freq: Four times a day (QID) | ORAL | Status: DC | PRN

## 2019-01-01 MED ORDER — ONDANSETRON HCL 4 MG/2ML IJ SOLN
4.0000 mg | Freq: Once | INTRAMUSCULAR | Status: AC
  Administered 2019-01-01: 4 mg via INTRAVENOUS
  Filled 2019-01-01: qty 2

## 2019-01-01 MED ORDER — PROCHLORPERAZINE EDISYLATE 10 MG/2ML IJ SOLN
10.0000 mg | INTRAMUSCULAR | Status: AC
  Administered 2019-01-01: 10 mg via INTRAVENOUS
  Filled 2019-01-01: qty 2

## 2019-01-01 MED ORDER — HYDROCODONE-ACETAMINOPHEN 5-325 MG PO TABS: 1.0000 | ORAL_TABLET | ORAL | Status: DC | PRN

## 2019-01-01 MED ORDER — OLANZAPINE 5 MG PO TABS
5.0000 mg | ORAL_TABLET | Freq: Every day | ORAL | Status: DC
  Filled 2019-01-01 (×2): qty 1

## 2019-01-01 MED ORDER — METOCLOPRAMIDE HCL 5 MG/ML IJ SOLN
10.0000 mg | Freq: Once | INTRAMUSCULAR | Status: AC
  Administered 2019-01-01: 10 mg via INTRAVENOUS
  Filled 2019-01-01: qty 2

## 2019-01-01 MED ORDER — MIRTAZAPINE 15 MG PO TABS
15.0000 mg | ORAL_TABLET | Freq: Every day | ORAL | Status: DC
  Administered 2019-01-01: 21:00:00 15 mg via ORAL
  Filled 2019-01-01: qty 1

## 2019-01-01 MED ORDER — SODIUM CHLORIDE 0.9 % IV BOLUS
1000.0000 mL | Freq: Once | INTRAVENOUS | Status: AC
  Administered 2019-01-01: 1000 mL via INTRAVENOUS

## 2019-01-01 MED ORDER — IOHEXOL 300 MG/ML  SOLN
100.0000 mL | Freq: Once | INTRAMUSCULAR | Status: AC | PRN
  Administered 2019-01-01: 100 mL via INTRAVENOUS

## 2019-01-01 MED ORDER — POTASSIUM CHLORIDE IN NACL 40-0.9 MEQ/L-% IV SOLN
INTRAVENOUS | Status: DC
  Administered 2019-01-01 – 2019-01-02 (×2): 125 mL/h via INTRAVENOUS
  Filled 2019-01-01 (×5): qty 1000

## 2019-01-01 NOTE — H&P (Signed)
[facility] [facility] [place] NAME: Leah Osborne    MR#:  [id number]  DATE OF BIRTH:  [birth year]  DATE OF ADMISSION:  01/01/2019  PRIMARY CARE PHYSICIAN: Olin Hauser, DO   REQUESTING/REFERRING PHYSICIAN:   CHIEF COMPLAINT:   Chief Complaint  Patient presents with  . Post-op Problem    HISTORY OF PRESENT ILLNESS: Leah Osborne  is a 33 y.o. female with a known history of rheumatoid arthritis, bipolar disorder, hyperlipidemia, hypertension, history of gastric sleeve surgery on June 3 presented to the emergency room for nausea and vomiting.  Patient had gastric sleeve surgery on June 30 [facility] and after 2 weeks after surgery she has noticed more nausea and vomiting.  For the last couple of days she has been having a lot of vomiting unable to eat any food and drink any fluids.  She was evaluated in the emergency room appears dry and dehydrated with low potassium level.  No complains of any vomiting of blood, rectal bleed.  Hospitalist service was consulted for further care.  She was worked up with CT abdomen which showed nonobstructive nephrolithiasis no acute pathology.  PAST MEDICAL HISTORY:   Past Medical History:  Diagnosis Date  . Abnormal Pap smear of cervix   . Arthritis   . Arthritis, rheumatoid ([place])   . Bipolar 1 disorder ([place])   . Chest pain   . Headache    MIGRAINES  . Hyperlipidemia   . Hypertension   . Palpitations    a. 09/2017 Zio Monitor: predominantly RSR, avg HR 87, 11 A tach/SVT episodes, fastest 176 x 7 beats, longest 9 beats. Rare PAC's, occas PVC's, rare couplets/triplets.  Marland Kitchen PTSD (post-traumatic stress disorder)   . PVC (premature ventricular contraction)   . Social phobia     PAST SURGICAL HISTORY:  Past Surgical History:  Procedure Laterality Date  . CHOLECYSTECTOMY    . LAPAROSCOPIC GASTRIC SLEEVE RESECTION  11/19/2018  . LAPAROSCOPIC TUBAL LIGATION Bilateral 09/19/2015    Procedure: LAPAROSCOPIC BILATERAL TUBAL BANDING;  Surgeon: Brayton Mars, MD;  Location: [facility] ORS;  Service: Gynecology;  Laterality: Bilateral;    SOCIAL HISTORY:  Social History   Tobacco Use  . Smoking status: Never Smoker  . Smokeless tobacco: Never Used  Substance Use Topics  . Alcohol use: Not Currently    Alcohol/week: 0.0 standard drinks    FAMILY HISTORY:  Family History  Problem Relation Age of Onset  . Diabetes Maternal Grandmother   . Heart disease Maternal Grandmother   . Heart failure Maternal Grandmother   . Heart attack Maternal Grandmother   . Rheum arthritis Mother   . Healthy Father   . Heart Problems Maternal Grandfather   . Atrial fibrillation Paternal Grandmother   . Cancer Neg Hx     DRUG ALLERGIES: No Known Allergies  REVIEW OF SYSTEMS:   CONSTITUTIONAL: No fever, fatigue or weakness.  EYES: No blurred or double vision.  EARS, NOSE, AND THROAT: No tinnitus or ear pain.  RESPIRATORY: No cough, shortness of breath, wheezing or hemoptysis.  CARDIOVASCULAR: No chest pain, orthopnea, edema.  GASTROINTESTINAL: Has nausea, vomiting,  mild abdominal pain.  No diarrhea. GENITOURINARY: No dysuria, hematuria.  ENDOCRINE: No polyuria, nocturia,  HEMATOLOGY: No anemia, easy bruising or bleeding SKIN: No rash or lesion. MUSCULOSKELETAL: No joint pain or arthritis.   NEUROLOGIC: No tingling, numbness, weakness.  PSYCHIATRY: No anxiety or depression.   MEDICATIONS AT HOME:  Prior to Admission medications  Medication Sig Start Date End Date Taking? Authorizing Provider  Adalimumab (HUMIRA PEN [place]) Inject into the skin.    [provider]  celecoxib (CELEBREX) 200 MG capsule Take 200 mg by mouth 2 (two) times daily.    [provider]  Cyanocobalamin (NASCOBAL) 500 MCG/0.1ML SOLN 1 spray into 1 nare once a week 08/22/18   [provider]  Elagolix Sodium (ORILISSA) 150 MG TABS Take 150 mg by mouth daily. Patient not taking:  Reported on 01/01/2019 08/27/18   Rubie Maid, MD  esomeprazole (NEXIUM) 40 MG capsule Take by mouth. 12/25/18 06/23/19  [provider]  lamoTRIgine (LAMICTAL) 200 MG tablet Take 200 mg by mouth daily.    [provider]  levothyroxine (SYNTHROID, LEVOTHROID) 25 MCG tablet Take 1 tablet (25 mcg total) by mouth daily before breakfast. Patient not taking: Reported on 01/01/2019 05/08/18   Olin Hauser, DO  magnesium oxide (MAG-OX) 400 MG tablet Take 1 tablet (400 mg total) by mouth daily. Patient not taking: Reported on 01/01/2019 07/10/18   Deboraha Sprang, MD  mirtazapine (REMERON) 15 MG tablet Take 15 mg by mouth at bedtime.    [provider]  OLANZapine (ZYPREXA) 5 MG tablet Take by mouth. 12/23/18 01/22/19  [provider]  ondansetron (ZOFRAN-ODT) 8 MG disintegrating tablet Take 1 tablet (8 mg total) by mouth every 8 (eight) hours as needed for nausea or vomiting. 01/01/19 01/31/19  Karamalegos, Devonne Doughty, DO  promethazine (PHENERGAN) 12.5 MG tablet Take by mouth. 12/03/18 01/22/19  [provider]  propranolol ER (INDERAL LA) 60 MG 24 hr capsule Take 1 capsule (60 mg total) by mouth daily. Patient not taking: Reported on 01/01/2019 07/10/18   Deboraha Sprang, MD  spironolactone (ALDACTONE) 50 MG tablet TAKE 1 TABLET BY MOUTH TWICE A DAY Patient not taking: Reported on 01/01/2019 09/29/18   Rubie Maid, MD  sucralfate (CARAFATE) 1 GM/10ML suspension Take by mouth. 12/25/18 01/24/19  [provider]  topiramate (TOPAMAX) 25 MG tablet Take 50 mg by mouth daily. 50MG  ONCE DAILY    [provider]      PHYSICAL EXAMINATION:   VITAL SIGNS: Blood pressure (!) 166/81, pulse 81, temperature 97.8 F (36.6 C), temperature source Oral, resp. rate 16, height 5\' 1"  (1.549 m), weight 121.6 kg, last menstrual period 12/03/2018, SpO2 100 %.  GENERAL:  33 y.o.-year-old patient lying in the bed with no acute distress.  EYES: Pupils equal, round,  reactive to light and accommodation. No scleral icterus. Extraocular muscles intact.  HEENT: Head atraumatic, normocephalic. Oropharynx dry and nasopharynx clear.  NECK:  Supple, no jugular venous distention. No thyroid enlargement, no tenderness.  LUNGS: Normal breath sounds bilaterally, no wheezing, rales,rhonchi or crepitation. No use of accessory muscles of respiration.  CARDIOVASCULAR: S1, S2 normal. No murmurs, rubs, or gallops.  ABDOMEN: Soft, mild tenderness around umbilicus, nondistended. Bowel sounds present. No organomegaly or mass.  EXTREMITIES: No pedal edema, cyanosis, or clubbing.  NEUROLOGIC: Cranial nerves II through XII are intact. Muscle strength 5/5 in all extremities. Sensation intact. Gait not checked.  PSYCHIATRIC: The patient is alert and oriented x 3.  SKIN: No obvious rash, lesion, or ulcer.   LABORATORY PANEL:   CBC Recent Labs  Lab 01/01/19 1309  WBC 9.9  HGB 16.5*  HCT 49.1*  PLT 352  MCV 83.6  MCH 28.1  MCHC 33.6  RDW 14.5  LYMPHSABS 1.6  MONOABS 1.2*  EOSABS 0.1  BASOSABS 0.0   ------------------------------------------------------------------------------------------------------------------  Chemistries  Recent Labs  Lab 01/01/19 1309  NA 136  K 2.8*  CL 91*  CO2 27  GLUCOSE 108*  BUN 15  CREATININE 0.93  CALCIUM 9.0  MG 2.0  AST 28  ALT 27  ALKPHOS 61  BILITOT 1.7*   ------------------------------------------------------------------------------------------------------------------ estimated creatinine clearance is 107.9 mL/min (by C-G formula based on SCr of 0.93 mg/dL). ------------------------------------------------------------------------------------------------------------------ Recent Labs    01/01/19 1309  TSH 3.225     Coagulation profile No results for input(s): INR, PROTIME in the last 168 hours. ------------------------------------------------------------------------------------------------------------------- No  results for input(s): DDIMER in the last 72 hours. -------------------------------------------------------------------------------------------------------------------  Cardiac Enzymes No results for input(s): CKMB, TROPONINI, MYOGLOBIN in the last 168 hours.  Invalid input(s): CK ------------------------------------------------------------------------------------------------------------------ Invalid input(s): POCBNP  ---------------------------------------------------------------------------------------------------------------  Urinalysis    Component Value Date/Time   COLORURINE AMBER (A) 01/01/2019 1312   APPEARANCEUR CLOUDY (A) 01/01/2019 1312   LABSPEC 1.027 01/01/2019 1312   PHURINE 6.0 01/01/2019 1312   GLUCOSEU NEGATIVE 01/01/2019 1312   HGBUR LARGE (A) 01/01/2019 1312   BILIRUBINUR SMALL (A) 01/01/2019 1312   BILIRUBINUR negative 10/14/2018 1413   KETONESUR 80 (A) 01/01/2019 1312   PROTEINUR 100 (A) 01/01/2019 1312   UROBILINOGEN 0.2 10/14/2018 1413   NITRITE NEGATIVE 01/01/2019 1312   LEUKOCYTESUR SMALL (A) 01/01/2019 1312     RADIOLOGY: Ct Abdomen Pelvis W Contrast  Result Date: 01/01/2019 CLINICAL DATA:  Abdominal distension, gastric sleeve, nausea, vomiting EXAM: CT ABDOMEN AND PELVIS WITH CONTRAST TECHNIQUE: Multidetector CT imaging of the abdomen and pelvis was performed using the standard protocol following bolus administration of intravenous contrast. CONTRAST:  128mL OMNIPAQUE IOHEXOL 300 MG/ML  SOLN COMPARISON:  CT 02/14/2016 FINDINGS: Lower chest: Lung bases are clear. No effusions. Heart is normal size. Hepatobiliary: Prior cholecystectomy. Diffuse fatty infiltration of the liver. No focal hepatic abnormality. Pancreas: No focal abnormality or ductal dilatation. Spleen: No focal abnormality.  Normal size. Adrenals/Urinary Tract: Horseshoe kidney again noted. No hydronephrosis or renal mass. Small nonobstructing stone in the lower pole of the left kidney. No  ureteral stones. Adrenal glands and urinary bladder unremarkable Stomach/Bowel: Normal appendix. Prior gastric sleeve. No complicating features. No bowel obstruction. Vascular/Lymphatic: No evidence of aneurysm or adenopathy. Reproductive: Uterus and adnexa unremarkable.  No mass. Other: No free fluid or free air. Musculoskeletal: Sclerosis around the SI joints bilaterally compatible with sacroiliitis. No acute bony abnormality. IMPRESSION: Changes of gastric sleeve.  No complicating feature. Horseshoe kidney.  Left lower pole nonobstructing nephrolithiasis. No acute findings in the abdomen or pelvis. Fatty infiltration of the liver. Electronically Signed   By: Rolm Baptise M.D.   On: 01/01/2019 15:38    EKG: Orders placed or performed during the hospital encounter of 01/01/19  . ED EKG  . ED EKG  . EKG 12-Lead  . EKG 12-Lead    IMPRESSION AND PLAN: 32 with a known history of rheumatoid arthritis, bipolar disorder, hyperlipidemia, hypertension, history of gastric sleeve surgery on June 3 presented to the emergency room for nausea and vomiting.  Patient had gastric sleeve surgery on June 30 [facility] and after 2 weeks after surgery she has noticed more nausea and vomiting.  -Intractable nausea vomiting Admit patient to medical floor observation bed IV fluids Antiemetic medication intravenously  -Acute hypokalemia Replace potassium intravenously aggressively  -Dehydration IV fluids  -DVT prophylaxis subcu Lovenox daily  All the records are reviewed and case discussed with ED provider. Management plans discussed with the patient, family and they are in agreement.  CODE STATUS:Full  code Code Status History    Date Active Date Inactive Code Status Order ID Comments User Context   10/23/2016 1446 10/24/2016 1607 Full Code [id number]  Clovis Fredrickson, MD Inpatient   10/22/2016 1708 10/23/2016 1312 Full Code [id number]  Recardo Evangelist, PA-C ED   Advance Care Planning Activity        TOTAL TIME TAKING CARE OF THIS PATIENT: 52 minutes.    Saundra Shelling M.D on 01/01/2019 at 4:37 PM  Between 7am to 6pm - Pager - [phone]  After 6pm go to [email or web address] - password [email or web address] [place] [facility]  Office  [phone]  CC: Primary care physician; Olin Hauser, DO

## 2019-01-01 NOTE — Progress Notes (Signed)
Virtual Visit via Telephone The purpose of this virtual visit is to provide medical care while limiting exposure to the novel coronavirus (COVID19) for both patient and office staff.  Consent was obtained for phone visit:  Yes.   Answered questions that patient had about telehealth interaction:  Yes.   I discussed the limitations, risks, security and privacy concerns of performing an evaluation and management service by telephone. I also discussed with the patient that there may be a patient responsible charge related to this service. The patient expressed understanding and agreed to proceed.  Patient Location: Home Provider Location: [facility])  ---------------------------------------------------------------------- Chief Complaint  Patient presents with  . Emesis    nausea post gastric bypass surgery x 6weeks ago    S: Reviewed CMA documentation. I have called patient and gathered additional HPI as follows:  NAUSEA / VOMITING Reports that symptoms nausea vomiting started 2 weeks after gastric bypass surgery, sleeve gastrectomy on 11/19/18. Has followed back up with [facility] up to 2-3 times after surgery, she has had an upper endoscopy with dilatation thought there was possible mild stricture but did not provide relief. Also tried on various anti emetic medications limited results. Has known history of prior nausea in past. - Described 5 x vomiting a day. Very poor intake, only fluids, cannot take protein shake anymore - Admits urine is darker, and concerned about dehydrated - She has returned to Bariatric surgeon 3 times post op, they tried endoscopy - She has tried Zofran 4mg , Promethazine 12.5mg , Carafate, Nexium  Denies any high risk travel to areas of current concern for [place]. Denies any known or suspected exposure to person with or possibly with COVID19.  Denies any fevers, chills, sweats, body ache, cough, shortness of breath, sinus pain or  pressure, headache, abdominal pain, diarrhea  Past Medical History:  Diagnosis Date  . Abnormal Pap smear of cervix   . Arthritis   . Arthritis, rheumatoid ([facility])   . Bipolar 1 disorder ([place])   . Chest pain   . Headache    MIGRAINES  . Hyperlipidemia   . Hypertension   . Palpitations    a. 09/2017 Zio Monitor: predominantly RSR, avg HR 87, 11 A tach/SVT episodes, fastest 176 x 7 beats, longest 9 beats. Rare PAC's, occas PVC's, rare couplets/triplets.  Marland Kitchen PTSD (post-traumatic stress disorder)   . PVC (premature ventricular contraction)   . Social phobia    Social History   Tobacco Use  . Smoking status: Never Smoker  . Smokeless tobacco: Never Used  Substance Use Topics  . Alcohol use: Not Currently    Alcohol/week: 0.0 standard drinks  . Drug use: No    Current Outpatient Medications:  .  esomeprazole (NEXIUM) 40 MG capsule, Take by mouth., Disp: , Rfl:  .  mirtazapine (REMERON) 15 MG tablet, Take 15 mg by mouth at bedtime., Disp: , Rfl:  .  ondansetron (ZOFRAN-ODT) 8 MG disintegrating tablet, Take 1 tablet (8 mg total) by mouth every 8 (eight) hours as needed for nausea or vomiting., Disp: 30 tablet, Rfl: 0 .  promethazine (PHENERGAN) 12.5 MG tablet, Take by mouth., Disp: , Rfl:  .  sucralfate (CARAFATE) 1 GM/10ML suspension, Take by mouth., Disp: , Rfl:  .  Adalimumab (HUMIRA PEN [place]), Inject into the skin., Disp: , Rfl:  .  celecoxib (CELEBREX) 200 MG capsule, Take 200 mg by mouth 2 (two) times daily., Disp: , Rfl:  .  Cyanocobalamin (NASCOBAL) 500 MCG/0.1ML SOLN, 1 spray into  1 nare once a week, Disp: , Rfl:  .  Elagolix Sodium (ORILISSA) 150 MG TABS, Take 150 mg by mouth daily. (Patient not taking: Reported on 01/01/2019), Disp: 30 tablet, Rfl: 11 .  lamoTRIgine (LAMICTAL) 200 MG tablet, Take 200 mg by mouth daily., Disp: , Rfl:  .  levothyroxine (SYNTHROID, LEVOTHROID) 25 MCG tablet, Take 1 tablet (25 mcg total) by mouth daily before breakfast. (Patient not taking: Reported  on 01/01/2019), Disp: 90 tablet, Rfl: 3 .  magnesium oxide (MAG-OX) 400 MG tablet, Take 1 tablet (400 mg total) by mouth daily. (Patient not taking: Reported on 01/01/2019), Disp: 90 tablet, Rfl: 3 .  OLANZapine (ZYPREXA) 5 MG tablet, Take by mouth., Disp: , Rfl:  .  omeprazole (PRILOSEC) 40 MG capsule, Take 40 mg by mouth daily., Disp: , Rfl:  .  propranolol ER (INDERAL LA) 60 MG 24 hr capsule, Take 1 capsule (60 mg total) by mouth daily. (Patient not taking: Reported on 01/01/2019), Disp: 90 capsule, Rfl: 3 .  spironolactone (ALDACTONE) 50 MG tablet, TAKE 1 TABLET BY MOUTH TWICE A DAY (Patient not taking: Reported on 01/01/2019), Disp: 60 tablet, Rfl: 1 .  topiramate (TOPAMAX) 25 MG tablet, Take 50 mg by mouth daily. 50MG  ONCE DAILY, Disp: , Rfl:   Depression screen [facility] 2/9 10/21/2018 09/30/2018 05/06/2018  Decreased Interest 0 0 0  Down, Depressed, Hopeless 0 0 0  PHQ - 2 Score 0 0 0  Altered sleeping 0 0 0  Tired, decreased energy 0 0 0  Change in appetite 0 0 0  Feeling bad or failure about yourself  0 0 0  Trouble concentrating 0 2 0  Moving slowly or fidgety/restless 2 0 0  Suicidal thoughts 0 0 0  PHQ-9 Score 2 2 0  Difficult doing work/chores Somewhat difficult Somewhat difficult -    GAD 7 : Generalized Anxiety Score 10/21/2018 09/30/2018 04/17/2018 09/07/2016  Nervous, Anxious, on Edge 2 0 0 3  Control/stop worrying 2 1 0 3  Worry too much - different things 2 2 0 3  Trouble relaxing 2 0 0 1  Restless 3 0 0 0  Easily annoyed or irritable 3 3 0 3  Afraid - awful might happen 1 3 0 3  Total GAD 7 Score 15 9 0 16  Anxiety Difficulty Extremely difficult Not difficult at all Not difficult at all Somewhat difficult    -------------------------------------------------------------------------- O: No physical exam performed due to remote telephone encounter.  Lab results reviewed.  Recent Results (from the past 2160 hour(s))  TSH     Status: None   Collection Time: 10/14/18 11:25 AM   Result Value Ref Range   TSH 2.59 mIU/L    Comment:           Reference Range .           > or = 20 Years  0.40-4.50 .                Pregnancy Ranges           First trimester    0.26-2.66           Second trimester   0.55-2.73           Third trimester    0.43-2.91   VITAMIN D 25 Hydroxy (Vit-D Deficiency, Fractures)     Status: Abnormal   Collection Time: 10/14/18 11:25 AM  Result Value Ref Range   Vit D, 25-Hydroxy 26 (L) 30 - 100 ng/mL  Comment: Vitamin D Status         25-OH Vitamin D: . Deficiency:                    <20 ng/mL Insufficiency:             20 - 29 ng/mL Optimal:                 > or = 30 ng/mL . For 25-OH Vitamin D testing on patients on  D2-supplementation and patients for whom quantitation  of D2 and D3 fractions is required, the QuestAssureD(TM) 25-OH VIT D, (D2,D3), LC/MS/MS is recommended: order  code [id number] (patients >67yrs). . For more information on this test, go to: http://education.questdiagnostics.com/faq/FAQ163 (This link is being provided for  informational/educational purposes only.)   Lipid panel     Status: Abnormal   Collection Time: 10/14/18 11:25 AM  Result Value Ref Range   Cholesterol 141 <200 mg/dL   HDL 27 (L) > OR = 50 mg/dL   Triglycerides 118 <150 mg/dL   LDL Cholesterol (Calc) 93 mg/dL (calc)    Comment: Reference range: <100 . Desirable range <100 mg/dL for primary prevention;   <70 mg/dL for patients with CHD or diabetic patients  with > or = 2 CHD risk factors. Marland Kitchen LDL-C is now calculated using the Martin-Hopkins  calculation, which is a validated novel method providing  better accuracy than the Friedewald equation in the  estimation of LDL-C.  Cresenciano Genre et al. Annamaria Helling. 9449;675(91): 2061-2068  (http://education.[facility].com/faq/FAQ164)    Total CHOL/HDL Ratio 5.2 (H) <5.0 (calc)   Non-HDL Cholesterol (Calc) 114 <130 mg/dL (calc)    Comment: For patients with diabetes plus 1 major ASCVD risk  factor,  treating to a non-HDL-C goal of <100 mg/dL  (LDL-C of <70 mg/dL) is considered a therapeutic  option.   COMPLETE METABOLIC PANEL WITH GFR     Status: Abnormal   Collection Time: 10/14/18 11:25 AM  Result Value Ref Range   Glucose, Bld 107 (H) 65 - 99 mg/dL    Comment: .            Fasting reference interval . For someone without known diabetes, a glucose value between 100 and 125 mg/dL is consistent with prediabetes and should be confirmed with a follow-up test. .    BUN 21 7 - 25 mg/dL   Creat 0.91 0.50 - 1.10 mg/dL   GFR, Est Non African American 85 > OR = 60 mL/min/1.70m2   GFR, Est African American 98 > OR = 60 mL/min/1.72m2   BUN/Creatinine Ratio NOT APPLICABLE 6 - 22 (calc)   Sodium 139 135 - 146 mmol/L   Potassium 4.5 3.5 - 5.3 mmol/L   Chloride 105 98 - 110 mmol/L   CO2 25 20 - 32 mmol/L   Calcium 9.7 8.6 - 10.2 mg/dL   Total Protein 7.0 6.1 - 8.1 g/dL   Albumin 4.4 3.6 - 5.1 g/dL   Globulin 2.6 1.9 - 3.7 g/dL (calc)   AG Ratio 1.7 1.0 - 2.5 (calc)   Total Bilirubin 0.3 0.2 - 1.2 mg/dL   Alkaline phosphatase (APISO) 50 31 - 125 U/L   AST 17 10 - 30 U/L   ALT 20 6 - 29 U/L  CBC with Differential/Platelet     Status: Abnormal   Collection Time: 10/14/18 11:25 AM  Result Value Ref Range   WBC 8.6 3.8 - 10.8 Thousand/uL   RBC 5.11 (H) 3.80 - 5.10 Million/uL  Hemoglobin 14.2 11.7 - 15.5 g/dL   HCT 41.9 35.0 - 45.0 %   MCV 82.0 80.0 - 100.0 fL   MCH 27.8 27.0 - 33.0 pg   MCHC 33.9 32.0 - 36.0 g/dL   RDW 14.2 11.0 - 15.0 %   Platelets 432 (H) 140 - 400 Thousand/uL   MPV 9.8 7.5 - 12.5 fL   Neutro Abs 5,280 1,500 - 7,800 cells/uL   Lymphs Abs 2,288 850 - 3,900 cells/uL   Absolute Monocytes 783 200 - 950 cells/uL   Eosinophils Absolute 198 15 - 500 cells/uL   Basophils Absolute 52 0 - 200 cells/uL   Neutrophils Relative % 61.4 %   Total Lymphocyte 26.6 %   Monocytes Relative 9.1 %   Eosinophils Relative 2.3 %   Basophils Relative 0.6 %  Hemoglobin A1c      Status: Abnormal   Collection Time: 10/14/18 11:25 AM  Result Value Ref Range   Hgb A1c MFr Bld 5.8 (H) <5.7 % of total Hgb    Comment: For someone without known diabetes, a hemoglobin  A1c value between 5.7% and 6.4% is consistent with prediabetes and should be confirmed with a  follow-up test. . For someone with known diabetes, a value <7% indicates that their diabetes is well controlled. A1c targets should be individualized based on duration of diabetes, age, comorbid conditions, and other considerations. . This assay result is consistent with an increased risk of diabetes. . Currently, no consensus exists regarding use of hemoglobin A1c for diagnosis of diabetes for children. .    Mean Plasma Glucose 120 (calc)   eAG (mmol/L) 6.6 (calc)  Urine Culture     Status: None   Collection Time: 10/14/18 11:25 AM   Specimen: Urine  Result Value Ref Range   MICRO NUMBER: [id number]    SPECIMEN QUALITY: Adequate    Sample Source URINE, CLEAN CATCH    STATUS: FINAL    Result:      Three or more organisms present, each greater than 10,000 CFU/mL. May represent normal flora contamination from external genitalia. No further testing is required.  T4, free     Status: None   Collection Time: 10/14/18 11:25 AM  Result Value Ref Range   Free T4 1.0 0.8 - 1.8 ng/dL  POCT Urinalysis Dipstick     Status: Normal   Collection Time: 10/14/18  2:13 PM  Result Value Ref Range   Color, UA amber    Clarity, UA clear    Glucose, UA Negative Negative   Bilirubin, UA negative    Ketones, UA negative    Spec Grav, UA 1.010 1.010 - 1.025   Blood, UA negative    pH, UA 5.0 5.0 - 8.0   Protein, UA Negative Negative   Urobilinogen, UA 0.2 0.2 or 1.0 E.U./dL   Nitrite, UA negative    Leukocytes, UA Negative Negative   Appearance clear    Odor      -------------------------------------------------------------------------- A&P:  Problem List Items Addressed This Visit    S/P laparoscopic  sleeve gastrectomy    Other Visit Diagnoses    Non-intractable vomiting with nausea, unspecified vomiting type    -  Primary   Relevant Medications   ondansetron (ZOFRAN-ODT) 8 MG disintegrating tablet     Clinically concerning persistent nausea vomiting at risk of moderate dehydration based on history provided and limited PO intake, now only water small amounts, frequent vomiting still. Not able to tolerate protein supplement/shake - Followed by [facility] Bariatric after  gastric sleeve surgery 10/22/20, complication with nausea S/p upper endoscopy dilatation, without success On anti emetics  Plan - Discussed concerns that I have limited other options other than what Bariatrics are offering her right now, we considered dose increase and agree to trial higher dose Zofran ODT 8mg  q 8 hr PRN, otherwise she may ask Bariatrics about Reglan as a possibility - Given severity of her symptoms, advised that next option may be IVF rehydration and possibly IV medication trial, she should go directly to hospital ED at [facility] for further intervention  [facility] ED triage to notify them.  Patient can continue to f/u with Bariatrics as she was before, may need 2nd opinion in future if still no good results.  Meds ordered this encounter  Medications  . ondansetron (ZOFRAN-ODT) 8 MG disintegrating tablet    Sig: Take 1 tablet (8 mg total) by mouth every 8 (eight) hours as needed for nausea or vomiting.    Dispense:  30 tablet    Refill:  0    Follow-up: - Return as needed  Patient verbalizes understanding with the above medical recommendations including the limitation of remote medical advice.  Specific follow-up and call-back criteria were given for patient to follow-up or seek medical care more urgently if needed.  - Time spent in direct consultation with patient on phone: 12 minutes  Nobie Putnam, [facility] 01/01/2019, 11:00 AM

## 2019-01-01 NOTE — ED Provider Notes (Signed)
[facility] Emergency Department Provider Note  ____________________________________________   First MD Initiated Contact with Patient 01/01/19 1249     (approximate)  I have reviewed the triage vital signs and the nursing notes.   HISTORY  Chief Complaint Post-op Problem    HPI Leah Osborne is a 33 y.o. female with bipolar 1, hypertension, hyperlipidemia, gastric sleeve surgery about 6 weeks ago who now presents with nausea and vomiting.  Patient says she had nausea and vomiting that presented 2 weeks after the surgery.  She is gone to her surgeon multiple times for this.  They have done CT scans as well as had an endoscopy that were all negative.  They have been treating her symptomatically at this time.  Patient is taking Zofran and Phenergan at home.  She feels like things are not helping and she feels dehydrated.  She says she is vomited 6-7 times a day.  The vomiting is watery in nature, severe, nothing makes it better, nothing makes it worse.  Patient was seen by her primary care doctor who recommended coming into the ER for IV hydration and IV nausea medicine.       Past Medical History:  Diagnosis Date  . Abnormal Pap smear of cervix   . Arthritis   . Arthritis, rheumatoid ([place])   . Bipolar 1 disorder ([place])   . Chest pain   . Headache    MIGRAINES  . Hyperlipidemia   . Hypertension   . Palpitations    a. 09/2017 Zio Monitor: predominantly RSR, avg HR 87, 11 A tach/SVT episodes, fastest 176 x 7 beats, longest 9 beats. Rare PAC's, occas PVC's, rare couplets/triplets.  Marland Kitchen PTSD (post-traumatic stress disorder)   . PVC (premature ventricular contraction)   . Social phobia     Patient Active Problem List   Diagnosis Date Noted  . 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 Elevated hemoglobin A1c 02/12/2018  . Dyslipidemia 02/05/2018  . Abnormal skin growth 11/15/2016  . PTSD (post-traumatic stress disorder) 10/24/2016  . Severe  bipolar I disorder, most recent episode depressed without psychotic features ([place]) 10/23/2016  . Ankylosing spondylitis ([place]) 10/23/2016  . Sacroiliac inflammation ([place]) 08/02/2016  . Hypertension 05/28/2016  . Vitamin D deficiency 05/02/2016  . 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 Hypothyroidism 02/13/2016  . Anxiety and depression 01/23/2016  . Status post tubal ligation 09/22/2015  . Morbid (severe) obesity due to excess calories ([place]) 04/25/2015  . Anemia, iron deficiency 01/04/2015  . Headache, migraine 01/04/2015  . Abnormal cytological findings in female genital organs 07/04/2011    Past Surgical History:  Procedure Laterality Date  . CHOLECYSTECTOMY    . LAPAROSCOPIC GASTRIC SLEEVE RESECTION  11/19/2018  . LAPAROSCOPIC TUBAL LIGATION Bilateral 09/19/2015   Procedure: LAPAROSCOPIC BILATERAL TUBAL BANDING;  Surgeon: Brayton Mars, MD;  Location: [facility] ORS;  Service: Gynecology;  Laterality: Bilateral;    Prior to Admission medications   Medication Sig Start Date End Date Taking? Authorizing Provider  Adalimumab (HUMIRA PEN [place]) Inject into the skin.    [provider]  celecoxib (CELEBREX) 200 MG capsule Take 200 mg by mouth 2 (two) times daily.    [provider]  Cyanocobalamin (NASCOBAL) 500 MCG/0.1ML SOLN 1 spray into 1 nare once a week 08/22/18   [provider]  Elagolix Sodium (ORILISSA) 150 MG TABS Take 150 mg by mouth daily. Patient not taking: Reported on 01/01/2019 08/27/18   Rubie Maid, MD  esomeprazole (NEXIUM) 40 MG capsule Take by mouth. 12/25/18 06/23/19  [provider]  lamoTRIgine (LAMICTAL) 200 MG tablet Take 200 mg by mouth daily.    [provider]  levothyroxine (SYNTHROID, LEVOTHROID) 25 MCG tablet Take 1 tablet (25 mcg total) by mouth daily before breakfast. Patient not taking: Reported on 01/01/2019 05/08/18   Olin Hauser, DO  magnesium oxide (MAG-OX) 400 MG  tablet Take 1 tablet (400 mg total) by mouth daily. Patient not taking: Reported on 01/01/2019 07/10/18   Deboraha Sprang, MD  mirtazapine (REMERON) 15 MG tablet Take 15 mg by mouth at bedtime.    [provider]  OLANZapine (ZYPREXA) 5 MG tablet Take by mouth. 12/23/18 01/22/19  [provider]  omeprazole (PRILOSEC) 40 MG capsule Take 40 mg by mouth daily.    [provider]  ondansetron (ZOFRAN-ODT) 8 MG disintegrating tablet Take 1 tablet (8 mg total) by mouth every 8 (eight) hours as needed for nausea or vomiting. 01/01/19 01/31/19  Karamalegos, Devonne Doughty, DO  promethazine (PHENERGAN) 12.5 MG tablet Take by mouth. 12/03/18 01/22/19  [provider]  propranolol ER (INDERAL LA) 60 MG 24 hr capsule Take 1 capsule (60 mg total) by mouth daily. Patient not taking: Reported on 01/01/2019 07/10/18   Deboraha Sprang, MD  spironolactone (ALDACTONE) 50 MG tablet TAKE 1 TABLET BY MOUTH TWICE A DAY Patient not taking: Reported on 01/01/2019 09/29/18   Rubie Maid, MD  sucralfate (CARAFATE) 1 GM/10ML suspension Take by mouth. 12/25/18 01/24/19  [provider]  topiramate (TOPAMAX) 25 MG tablet Take 50 mg by mouth daily. 50MG  ONCE DAILY    [provider]    Allergies Patient has no known allergies.  Family History  Problem Relation Age of Onset  . Diabetes Maternal Grandmother   . Heart disease Maternal Grandmother   . Heart failure Maternal Grandmother   . Heart attack Maternal Grandmother   . Rheum arthritis Mother   . Healthy Father   . Heart Problems Maternal Grandfather   . Atrial fibrillation Paternal Grandmother   . Cancer Neg Hx     Social History Social History   Tobacco Use  . Smoking status: Never Smoker  . Smokeless tobacco: Never Used  Substance Use Topics  . Alcohol use: Not Currently    Alcohol/week: 0.0 standard drinks  . Drug use: No      Review of Systems Constitutional: No fever/chills Eyes: No visual changes. ENT: No  sore throat. Cardiovascular: Denies chest pain. Respiratory: Denies shortness of breath. Gastrointestinal: No abdominal pain.  Positive nausea and vomiting.  No diarrhea.  No constipation. Genitourinary: Negative for dysuria. Musculoskeletal: Negative for back pain. Skin: Negative for rash. Neurological: Negative for headaches, focal weakness or numbness. All other ROS negative ____________________________________________   PHYSICAL EXAM:  VITAL SIGNS: ED Triage Vitals [01/01/19 1245]  Enc Vitals Group     BP 132/64     Pulse Rate (!) 103     Resp 16     Temp 97.8 F (36.6 C)     Temp Source Oral     SpO2 96 %     Weight 268 lb (121.6 kg)     Height 5\' 1"  (1.549 m)     Head Circumference      Peak Flow      Pain Score 0     Pain Loc      Pain Edu?      Excl. in [place]?  Constitutional: Alert and oriented.  Overweight female, appears nauseous Eyes: Conjunctivae are normal. EOMI. Head: Atraumatic. Nose: No congestion/rhinnorhea. Mouth/Throat: Mucous membranes are moist.   Neck: No stridor. Trachea Midline. FROM Cardiovascular: Slightly tachycardic, regular rhythm. Grossly normal heart sounds.  Good peripheral circulation. Respiratory: Normal respiratory effort.  No retractions. Lungs CTAB. Gastrointestinal: Soft and nontender. No distention. No abdominal bruits. Well healing scar  Musculoskeletal: No lower extremity tenderness nor edema.  No joint effusions. Neurologic:  Normal speech and language. No gross focal neurologic deficits are appreciated.  Skin:  Skin is warm, dry and intact. No rash noted. Psychiatric: Mood and affect are normal. Speech and behavior are normal. GU: Deferred   ____________________________________________   LABS (all labs ordered are listed, but only abnormal results are displayed)  Labs Reviewed  CBC WITH DIFFERENTIAL/PLATELET - Abnormal; Notable for the following components:      Result Value   RBC 5.87 (*)    Hemoglobin 16.5 (*)     HCT 49.1 (*)    Monocytes Absolute 1.2 (*)    All other components within normal limits  COMPREHENSIVE METABOLIC PANEL - Abnormal; Notable for the following components:   Potassium 2.8 (*)    Chloride 91 (*)    Glucose, Bld 108 (*)    Total Bilirubin 1.7 (*)    Anion gap 18 (*)    All other components within normal limits  LIPASE, BLOOD - Abnormal; Notable for the following components:   Lipase 98 (*)    All other components within normal limits  URINALYSIS, ROUTINE W REFLEX MICROSCOPIC - Abnormal; Notable for the following components:   Color, Urine AMBER (*)    APPearance CLOUDY (*)    Hgb urine dipstick LARGE (*)    Bilirubin Urine SMALL (*)    Ketones, ur 80 (*)    Protein, ur 100 (*)    Leukocytes,Ua SMALL (*)    All other components within normal limits  URINALYSIS, COMPLETE (UACMP) WITH MICROSCOPIC - Abnormal; Notable for the following components:   Color, Urine AMBER (*)    APPearance CLOUDY (*)    Hgb urine dipstick LARGE (*)    Bilirubin Urine SMALL (*)    Ketones, ur 80 (*)    Protein, ur 100 (*)    Leukocytes,Ua SMALL (*)    Bacteria, UA RARE (*)    All other components within normal limits  MAGNESIUM  TSH  T4, FREE   ____________________________________________   ED ECG REPORT I, Vanessa [place], the attending physician, personally viewed and interpreted this ECG.  Normal sinus rate of 81, occasional PVC, right axis deviation, normal intervals including normal QTC of 432. ____________________________________________  RADIOLOGY  Official radiology report(s): Ct Abdomen Pelvis W Contrast  Result Date: 01/01/2019 CLINICAL DATA:  Abdominal distension, gastric sleeve, nausea, vomiting EXAM: CT ABDOMEN AND PELVIS WITH CONTRAST TECHNIQUE: Multidetector CT imaging of the abdomen and pelvis was performed using the standard protocol following bolus administration of intravenous contrast. CONTRAST:  143mL OMNIPAQUE IOHEXOL 300 MG/ML  SOLN COMPARISON:  CT 02/14/2016  FINDINGS: Lower chest: Lung bases are clear. No effusions. Heart is normal size. Hepatobiliary: Prior cholecystectomy. Diffuse fatty infiltration of the liver. No focal hepatic abnormality. Pancreas: No focal abnormality or ductal dilatation. Spleen: No focal abnormality.  Normal size. Adrenals/Urinary Tract: Horseshoe kidney again noted. No hydronephrosis or renal mass. Small nonobstructing stone in the lower pole of the left kidney. No ureteral stones. Adrenal glands and urinary bladder unremarkable Stomach/Bowel: Normal appendix. Prior gastric sleeve. No  complicating features. No bowel obstruction. Vascular/Lymphatic: No evidence of aneurysm or adenopathy. Reproductive: Uterus and adnexa unremarkable.  No mass. Other: No free fluid or free air. Musculoskeletal: Sclerosis around the SI joints bilaterally compatible with sacroiliitis. No acute bony abnormality. IMPRESSION: Changes of gastric sleeve.  No complicating feature. Horseshoe kidney.  Left lower pole nonobstructing nephrolithiasis. No acute findings in the abdomen or pelvis. Fatty infiltration of the liver. Electronically Signed   By: Rolm Baptise M.D.   On: 01/01/2019 15:38    ____________________________________________   PROCEDURES  Procedure(s) performed (including Critical Care):  Procedures   ____________________________________________   INITIAL IMPRESSION / ASSESSMENT AND PLAN / ED COURSE  Leah Osborne was evaluated in Emergency Department on 01/01/2019 for the symptoms described in the history of present illness. She was evaluated in the context of the global COVID-19 pandemic, which necessitated consideration that the patient might be at risk for infection with the SARS-CoV-2 virus that causes COVID-19. Institutional protocols and algorithms that pertain to the evaluation of patients at risk for COVID-19 are in a state of rapid change based on information released by regulatory bodies including the CDC and federal and  state organizations. These policies and algorithms were followed during the patient's care in the ED.    This is most likely secondary to patient's recent gastric sleeve.  I discussed with patient wanting to do CT scan to rule out obstruction or other complications of her sleeve however patient declined saying that she had already had that done and does not want any further imaging.  She wants [place] to focus on her symptoms.  Will get labs to evaluate for electrolyte abnormalities, dehydration.  Will give EKG to evaluate for QTC given patient's been taking a lot of antiemetics.   Patient given 1 L of fluids and Zofran.  Clinical Course as of Jan 01 1611  Thu Jan 01, 2019  1440 Patient with evidence of dehydration with anion gap of 18.  Low chloride of 91.  And potassium of 2.8.   [MF]  3614 Patient now requesting CT scan.    [MF]    Clinical Course User Index [MF] Vanessa [place], MD    White count is normal which is reassuring.  Patient's hemoglobin is elevated which could be secondary to hemoconcentration.  CT scan was negative.  Discussed with the hospital team will admit patient for repletion of her potassium and IV hydration and antiemetics.  ____________________________________________   FINAL CLINICAL IMPRESSION(S) / ED DIAGNOSES   Final diagnoses:  Hypokalemia  Dehydration      MEDICATIONS GIVEN DURING THIS VISIT:  Medications  potassium chloride 10 mEq in 100 mL IVPB (10 mEq Intravenous New Bag/Given 01/01/19 1608)  ondansetron (ZOFRAN) injection 4 mg (4 mg Intravenous Given 01/01/19 1317)  sodium chloride 0.9 % bolus 1,000 mL (0 mLs Intravenous Stopped 01/01/19 1515)  ondansetron (ZOFRAN) injection 4 mg (4 mg Intravenous Given 01/01/19 1511)  iohexol (OMNIPAQUE) 300 MG/ML solution 100 mL (100 mLs Intravenous Contrast Given 01/01/19 1521)  metoCLOPramide (REGLAN) injection 10 mg (10 mg Intravenous Given 01/01/19 1612)     ED Discharge Orders    None       Note:   This document was prepared using Dragon voice recognition software and may include unintentional dictation errors.   Vanessa [place], MD 01/01/19 [phone]

## 2019-01-01 NOTE — ED Notes (Signed)
Pt leaving for CT.  

## 2019-01-01 NOTE — ED Triage Notes (Addendum)
Patient presents to the ED with nausea and vomiting x 4 weeks after a gastric bypass 6 weeks ago.  Patient's pcp instructed patient to come to the ED.  Patient appears uncomfortable.  Patient denies abdominal pain, reports dizziness.

## 2019-01-01 NOTE — ED Notes (Signed)
Potassium rate dec to 75cc/hr then to 50cc/hr minutes later bc pt couldn't tolerate burning sensation.

## 2019-01-01 NOTE — ED Notes (Signed)
Pt up to use bedside toilet.

## 2019-01-01 NOTE — Progress Notes (Signed)
Advanced care plan. Purpose of the Encounter: CODE STATUS Parties in Attendance: Patient Patient's Decision Capacity: Good Subjective/Patient's story: Leah Osborne  is a 33 y.o. female with a known history of rheumatoid arthritis, bipolar disorder, hyperlipidemia, hypertension, history of gastric sleeve surgery on June 3 presented to the emergency room for nausea and vomiting.  Patient had gastric sleeve surgery on June 30 [facility] and after 2 weeks after surgery she has noticed more nausea and vomiting Objective/Medical story Patient presented with nausea and vomiting.  Needs aggressive IV fluids.  Needs electrolyte replacement reduce potassium.  Appears dry and dehydrated. Goals of care determination:  Advance care directives goals of care treatment plan discussed. Patient wants everything done which includes CPR, intubation ventilator if the need arises. CODE STATUS: Full code Time spent discussing advanced care planning: 16 minutes

## 2019-01-01 NOTE — Patient Instructions (Signed)
Thank you for coming to the office today.    Please schedule a Follow-up Appointment to: No follow-ups on file.  If you have any other questions or concerns, please feel free to call the office or send a message through MyChart. You may also schedule an earlier appointment if necessary.  Additionally, you may be receiving a survey about your experience at our office within a few days to 1 week by e-mail or mail. We value your feedback.  Alexander Karamalegos, DO [facility], [facility] 

## 2019-01-01 NOTE — ED Notes (Signed)
Pt given urine cup, unable to void at this time.

## 2019-01-02 DIAGNOSIS — E876 Hypokalemia: Secondary | ICD-10-CM | POA: Diagnosis not present

## 2019-01-02 DIAGNOSIS — I10 Essential (primary) hypertension: Secondary | ICD-10-CM | POA: Diagnosis not present

## 2019-01-02 DIAGNOSIS — R112 Nausea with vomiting, unspecified: Secondary | ICD-10-CM | POA: Diagnosis not present

## 2019-01-02 DIAGNOSIS — E860 Dehydration: Secondary | ICD-10-CM | POA: Diagnosis not present

## 2019-01-02 LAB — CBC
HCT: 45.1 % (ref 36.0–46.0)
Hemoglobin: 14.8 g/dL (ref 12.0–15.0)
MCH: 28.1 pg (ref 26.0–34.0)
MCHC: 32.8 g/dL (ref 30.0–36.0)
MCV: 85.7 fL (ref 80.0–100.0)
Platelets: 291 10*3/uL (ref 150–400)
RBC: 5.26 MIL/uL — ABNORMAL HIGH (ref 3.87–5.11)
RDW: 14.8 % (ref 11.5–15.5)
WBC: 7.5 10*3/uL (ref 4.0–10.5)
nRBC: 0 % (ref 0.0–0.2)

## 2019-01-02 LAB — BASIC METABOLIC PANEL
Anion gap: 12 (ref 5–15)
BUN: 11 mg/dL (ref 6–20)
CO2: 25 mmol/L (ref 22–32)
Calcium: 8.2 mg/dL — ABNORMAL LOW (ref 8.9–10.3)
Chloride: 103 mmol/L (ref 98–111)
Creatinine, Ser: 0.81 mg/dL (ref 0.44–1.00)
GFR calc Af Amer: >60 mL/min (ref >60)
GFR calc non Af Amer: >60 mL/min (ref >60)
Glucose, Bld: 85 mg/dL (ref 70–99)
Potassium: 3.2 mmol/L — ABNORMAL LOW (ref 3.5–5.1)
Sodium: 140 mmol/L (ref 135–145)

## 2019-01-02 MED ORDER — METOCLOPRAMIDE HCL 5 MG PO TABS: 5.0000 mg | ORAL_TABLET | Freq: Three times a day (TID) | ORAL | 0 refills | Status: DC

## 2019-01-02 MED ORDER — POTASSIUM CHLORIDE 10 MEQ/100ML IV SOLN
10.0000 meq | INTRAVENOUS | Status: AC
  Administered 2019-01-02 (×3): 10 meq via INTRAVENOUS
  Filled 2019-01-02: qty 100

## 2019-01-02 MED ORDER — ENOXAPARIN SODIUM 40 MG/0.4ML ~~LOC~~ SOLN: 40.0000 mg | Freq: Two times a day (BID) | SUBCUTANEOUS | Status: DC

## 2019-01-02 MED ORDER — POTASSIUM CHLORIDE ER 10 MEQ PO TBCR: 10.0000 meq | EXTENDED_RELEASE_TABLET | Freq: Every day | ORAL | 0 refills | Status: DC

## 2019-01-02 MED ORDER — METOCLOPRAMIDE HCL 5 MG/ML IJ SOLN
10.0000 mg | Freq: Three times a day (TID) | INTRAMUSCULAR | Status: DC
  Administered 2019-01-02 (×2): 10 mg via INTRAVENOUS
  Filled 2019-01-02 (×2): qty 2

## 2019-01-02 NOTE — Progress Notes (Signed)
Anticoagulation monitoring(Lovenox):  33 yo  female ordered Lovenox 40 mg Q24h  Filed Weights   01/01/19 1245  Weight: 268 lb (121.6 kg)   Body mass index is 50.64 kg/m.   Lab Results  Component Value Date   CREATININE 0.81 01/02/2019   CREATININE 0.93 01/01/2019   CREATININE 0.91 10/14/2018   Estimated Creatinine Clearance: 123.9 mL/min (by C-G formula based on SCr of 0.81 mg/dL). Hemoglobin & Hematocrit     Component Value Date/Time   HGB 14.8 01/02/2019 0524   HGB 11.4 10/05/2015 1626   HCT 45.1 01/02/2019 0524   HCT 34.9 10/05/2015 1626     Per Protocol for Patient with estCrcl > 30 ml/min and BMI > 40, will transition to Lovenox 40 mg Q12h.

## 2019-01-02 NOTE — Progress Notes (Signed)
Pt for discharge verbalizes understanding of all discharge plans meds / diet / activity and f/u.  Sl d/cd.

## 2019-01-02 NOTE — Progress Notes (Signed)
Pt left at this time via w/c  No c/o.

## 2019-01-02 NOTE — Discharge Instructions (Signed)
Soft bland diet for 2-3 days and then advance to usual diet  Follow-up with your bariatric surgeon within a week.

## 2019-01-03 LAB — HIV ANTIBODY (ROUTINE TESTING W REFLEX): HIV Screen 4th Generation wRfx: NONREACTIVE

## 2019-01-05 DIAGNOSIS — E876 Hypokalemia: Secondary | ICD-10-CM

## 2019-01-05 MED ORDER — POTASSIUM CHLORIDE 20 MEQ/15ML (10%) PO SOLN: 10.0000 meq | Freq: Every day | ORAL | 0 refills | Status: DC

## 2019-01-06 ENCOUNTER — Telehealth: Payer: Self-pay

## 2019-01-06 DIAGNOSIS — E876 Hypokalemia: Secondary | ICD-10-CM

## 2019-01-06 DIAGNOSIS — Z9884 Bariatric surgery status: Secondary | ICD-10-CM

## 2019-01-06 NOTE — Telephone Encounter (Signed)
Patient advised.

## 2019-01-06 NOTE — Telephone Encounter (Signed)
patient has a phone HFU on Friday.  She was wanting to get her potassium check before that appointment, since the reason for going to hospital was her potassium.  Please advise patient

## 2019-01-06 NOTE — Telephone Encounter (Signed)
Please notify her.  No problem. I have ordered Complete Chemistry with Potassium and other results. She can come in anytime this week in morning for a non fasting lab draw to have this result.  If she gets it done Weds / Thurs it should be ready by Friday.  Nobie Putnam, [facility] 01/06/2019, 3:44 PM

## 2019-01-07 ENCOUNTER — Other Ambulatory Visit: Payer: Self-pay

## 2019-01-07 DIAGNOSIS — Z9884 Bariatric surgery status: Secondary | ICD-10-CM

## 2019-01-07 DIAGNOSIS — E876 Hypokalemia: Secondary | ICD-10-CM

## 2019-01-08 ENCOUNTER — Other Ambulatory Visit: Payer: Self-pay

## 2019-01-08 ENCOUNTER — Encounter: Payer: Self-pay | Admitting: Family Medicine

## 2019-01-08 ENCOUNTER — Ambulatory Visit (INDEPENDENT_AMBULATORY_CARE_PROVIDER_SITE_OTHER): Payer: BC Managed Care – PPO | Admitting: Family Medicine

## 2019-01-08 VITALS — BP 108/93 | HR 107 | Temp 98.3°F | Resp 16 | Ht 61.0 in

## 2019-01-08 DIAGNOSIS — Z9884 Bariatric surgery status: Secondary | ICD-10-CM | POA: Diagnosis not present

## 2019-01-08 DIAGNOSIS — R42 Dizziness and giddiness: Secondary | ICD-10-CM

## 2019-01-08 DIAGNOSIS — E876 Hypokalemia: Secondary | ICD-10-CM

## 2019-01-08 DIAGNOSIS — R112 Nausea with vomiting, unspecified: Secondary | ICD-10-CM

## 2019-01-08 LAB — COMPLETE METABOLIC PANEL WITH GFR
AG Ratio: 1.8 (calc) (ref 1.0–2.5)
ALT: 18 U/L (ref 6–29)
AST: 22 U/L (ref 10–30)
Albumin: 3.7 g/dL (ref 3.6–5.1)
Alkaline phosphatase (APISO): 57 U/L (ref 31–125)
BUN: 9 mg/dL (ref 7–25)
CO2: 26 mmol/L (ref 20–32)
Calcium: 9.3 mg/dL (ref 8.6–10.2)
Chloride: 95 mmol/L — ABNORMAL LOW (ref 98–110)
Creat: 0.73 mg/dL (ref 0.50–1.10)
GFR, Est African American: 128 mL/min/{1.73_m2} (ref >=60)
GFR, Est Non African American: 111 mL/min/{1.73_m2} (ref >=60)
Globulin: 2.1 g/dL (calc) (ref 1.9–3.7)
Glucose, Bld: 76 mg/dL (ref 65–99)
Potassium: 3.4 mmol/L — ABNORMAL LOW (ref 3.5–5.3)
Sodium: 139 mmol/L (ref 135–146)
Total Bilirubin: 0.7 mg/dL (ref 0.2–1.2)
Total Protein: 5.8 g/dL — ABNORMAL LOW (ref 6.1–8.1)

## 2019-01-08 MED ORDER — MECLIZINE HCL 25 MG PO TABS: 25.0000 mg | ORAL_TABLET | Freq: Three times a day (TID) | ORAL | 1 refills | Status: DC | PRN

## 2019-01-08 MED ORDER — SCOPOLAMINE 1 MG/3DAYS TD PT72: 1.0000 | MEDICATED_PATCH | TRANSDERMAL | 1 refills | Status: DC

## 2019-01-08 MED ORDER — PROMETHAZINE HCL 25 MG RE SUPP: 25.0000 mg | Freq: Three times a day (TID) | RECTAL | 1 refills | Status: DC | PRN

## 2019-01-08 NOTE — Patient Instructions (Addendum)
For nausea, dizziness  Try Promethazine (Phenergan) Suppository every 6-8 hours as needed for nausea.  Try Meclizine for dizziness up to 3 times a day as needed.  If meclizine not effective, can switch to Scopolamine patch topical for up to 72 hours, dont take with meclizine.  Try to keep improving nutrition, hydration.  Call [facility] again to notify them that you were at hospital, only seem to benefit from IV fluids, then now again at risk of dehydration  Let me know if interested in other opinions [facility] or [facility] specialist   Please schedule a Follow-up Appointment to: Return in about 2 weeks (around 01/22/2019), or if symptoms worsen or fail to improve, for nausea vomiting.  If you have any other questions or concerns, please feel free to call the office or send a message through [place]. You may also schedule an earlier appointment if necessary.  Additionally, you may be receiving a survey about your experience at our office within a few days to 1 week by e-mail or mail. We value your feedback.  Nobie Putnam, DO [facility], [facility]   Potassium Content of Foods  Potassium is a mineral found in many foods and drinks. It affects how the heart works, and helps keep fluids and minerals balanced in the body. The amount of potassium you need each day depends on your age and any medical conditions you may have. Talk to your health care provider or dietitian about how much potassium you need. The following lists of foods provide the general serving size for foods and the approximate amount of potassium in each serving, listed in milligrams (mg). Actual values may vary depending on the product and how it is processed. High in potassium The following foods and beverages have 200 mg or more of potassium per serving:  Apricots (raw) - 2 have 200 mg of potassium.  Apricots (dry) - 5 have 200 mg of potassium.  Artichoke - 1 medium has 345 mg of potassium.   Avocado -  fruit has 245 mg of potassium.  Banana - 1 medium fruit has 425 mg of potassium.  [place] or baked beans (canned) -  cup has 280 mg of potassium.  White beans (canned) -  cup has 595 mg potassium.  Beef roast - 3 oz has 320 mg of potassium.  Ground beef - 3 oz has 270 mg of potassium.  Beets (raw or cooked) -  cup has 260 mg of potassium.  Bran muffin - 2 oz has 300 mg of potassium.  Broccoli (cooked) -  cup has 230 mg of potassium.  Brussels sprouts -  cup has 250 mg of potassium.  Cantaloupe -  cup has 215 mg of potassium.  Cereal, 100% bran -  cup has 200-400 mg of potassium.  Cheeseburger -1 single fast food burger has 225-400 mg of potassium.  Chicken - 3 oz has 220 mg of potassium.  Clams (canned) - 3 oz has 535 mg of potassium.  Crab - 3 oz has 225 mg of potassium.  Dates - 5 have 270 mg of potassium.  Dried beans and peas -  cup has 300-475 mg of potassium.  Figs (dried) - 2 have 260 mg of potassium.  Fish (halibut, tuna, cod, snapper) - 3 oz has 480 mg of potassium.  Fish (salmon, haddock, swordfish, perch) - 3 oz has 300 mg of potassium.  Fish (tuna, canned) - 3 oz has 200 mg of potassium.  [place] fries (fast food) -  3 oz has 470 mg of potassium.  Granola with fruit and nuts -  cup has 200 mg of potassium.  Grapefruit juice -  cup has 200 mg of potassium.  Honeydew melon -  cup has 200 mg of potassium.  Kale (raw) - 1 cup has 300 mg of potassium.  Kiwi - 1 medium fruit has 240 mg of potassium.  Kohlrabi, rutabaga, parsnips -  cup has 280 mg of potassium.  Lentils -  cup has 365 mg of potassium.  Mango - 1 each has 325 mg of potassium.  Milk (nonfat, low-fat, whole, buttermilk) - 1 cup has 350-380 mg of potassium.  Milk (chocolate) - 1 cup has 420 mg of potassium  Molasses - 1 Tbsp has 295 mg of potassium.  Mushrooms -  cup has 280 mg of potassium.  Nectarine - 1 each has 275 mg of potassium.  Nuts (almonds,  peanuts, hazelnuts, [place], cashew, mixed) - 1 oz has 200 mg of potassium.  Nuts (pistachios) - 1 oz has 295 mg of potassium.  Orange - 1 fruit has 240 mg of potassium.  Orange juice -  cup has 235 mg of potassium.  Papaya -  medium fruit has 390 mg of potassium.  Peanut butter (chunky) - 2 Tbsp has 240 mg of potassium.  Peanut butter (smooth) - 2 Tbsp has 210 mg of potassium.  Pear - 1 medium (200 mg) of potassium.  Pomegranate - 1 whole fruit has 400 mg of potassium.  Pomegranate juice -  cup has 215 mg of potassium.  Pork - 3 oz has 350 mg of potassium.  Potato chips (salted) - 1 oz has 465 mg of potassium.  Potato (baked with skin) - 1 medium has 925 mg of potassium.  Potato (boiled) -  cup has 255 mg of potassium.  Potato (Mashed) -  cup has 330 mg of potassium.  Prune juice -  cup has 370 mg of potassium.  Prunes - 5 have 305 mg of potassium.  Pudding (chocolate) -  cup has 230 mg of potassium.  Pumpkin (canned) -  cup has 250 mg of potassium.  Raisins (seedless) -  cup has 270 mg of potassium.  Seeds (sunflower or pumpkin) - 1 oz has 240 mg of potassium.  Soy milk - 1 cup has 300 mg of potassium.  Spinach (cooked) - 1/2 cup has 420 mg of potassium.  Spinach (canned) -  cup has 370 mg of potassium.  Sweet potato (baked with skin) - 1 medium has 450 mg of potassium.  Swiss chard -  cup has 480 mg of potassium.  Tomato or vegetable juice -  cup has 275 mg of potassium.  Tomato (sauce or puree) -  cup has 400-550 mg of potassium.  Tomato (raw) - 1 medium has 290 mg of potassium.  Tomato (canned) -  cup has 200-300 mg of potassium.  [place] - 3 oz has 250 mg of potassium.  Wheat germ - 1 oz has 250 mg of potassium.  Winter squash -  cup has 250 mg of potassium.  Yogurt (plain or fruited) - 6 oz has 260-435 mg of potassium.  Zucchini -  cup has 220 mg of potassium. Moderate in potassium The following foods and beverages have 50-200  mg of potassium per serving:  Apple - 1 fruit has 150 mg of potassium  Apple juice -  cup has 150 mg of potassium  Applesauce -  cup has 90 mg of potassium  Apricot  nectar -  cup has 140 mg of potassium  Asparagus (small spears) -  cup has 155 mg of potassium  Asparagus (large spears) - 6 have 155 mg of potassium  Bagel (cinnamon raisin) - 1 four-inch bagel has 130 mg of potassium  Bagel (egg or plain) - 1 four- inch bagel has 70 mg of potassium  Beans (green) -  cup has 90 mg of potassium  Beans (yellow) -  cup has 190 mg of potassium  Beer, regular - 12 oz has 100 mg of potassium  Beets (canned) -  cup has 125 mg of potassium  Blackberries -  cup has 115 mg of potassium  Blueberries -  cup has 60 mg of potassium  Bread (whole wheat) - 1 slice has 70 mg of potassium  Broccoli (raw) -  cup has 145 mg of potassium  Cabbage -  cup has 150 mg of potassium  Carrots (cooked or raw) -  cup has 180 mg of potassium  Cauliflower (raw) -  cup has 150 mg of potassium  Celery (raw) -  cup has 155 mg of potassium  Cereal, bran flakes -  cup has 120-150 mg of potassium  Cheese (cottage) -  cup has 110 mg of potassium  Cherries - 10 have 150 mg of potassium  Chocolate - 1 oz bar has 165 mg of potassium  Coffee (brewed) - 6 oz has 90 mg of potassium  Corn -  cup or 1 ear has 195 mg of potassium  Cucumbers -  cup has 80 mg of potassium  Egg - 1 large egg has 60 mg of potassium  Eggplant -  cup has 60 mg of potassium  Endive (raw) -  cup has 80 mg of potassium  English muffin - 1 has 65 mg of potassium  Fish (ocean perch) - 3 oz has 192 mg of potassium  Frankfurter, beef or pork - 1 has 75 mg of potassium  Fruit cocktail -  cup has 115 mg of potassium  Grape juice -  cup has 170 mg of potassium  Grapefruit -  fruit has 175 mg of potassium  Grapes -  cup has 155 mg of potassium  Greens: kale, turnip, collard -  cup has 110-150 mg of  potassium  Ice cream or frozen yogurt (chocolate) -  cup has 175 mg of potassium  Ice cream or frozen yogurt (vanilla) -  cup has 120-150 mg of potassium  Lemons, limes - 1 each has 80 mg of potassium  Lettuce - 1 cup has 100 mg of potassium  Mixed vegetables -  cup has 150 mg of potassium  Mushrooms, raw -  cup has 110 mg of potassium  Nuts (walnuts, pecans, or macadamia) - 1 oz has 125 mg of potassium  Oatmeal -  cup has 80 mg of potassium  Okra -  cup has 110 mg of potassium  Onions -  cup has 120 mg of potassium  Peach - 1 has 185 mg of potassium  Peaches (canned) -  cup has 120 mg of potassium  Pears (canned) -  cup has 120 mg of potassium  Peas, green (frozen) -  cup has 90 mg of potassium  Peppers (Green) -  cup has 130 mg of potassium  Peppers (Red) -  cup has 160 mg of potassium  Pineapple juice -  cup has 165 mg of potassium  Pineapple (fresh or canned) -  cup has 100 mg of potassium  Plums -  1 has 105 mg of potassium  Pudding, vanilla -  cup has 150 mg of potassium  Raspberries -  cup has 90 mg of potassium  Rhubarb -  cup has 115 mg of potassium  Rice, wild -  cup has 80 mg of potassium  Shrimp - 3 oz has 155 mg of potassium  Spinach (raw) - 1 cup has 170 mg of potassium  Strawberries -  cup has 125 mg of potassium  Summer squash -  cup has 175-200 mg of potassium  Swiss chard (raw) - 1 cup has 135 mg of potassium  Tangerines - 1 fruit has 140 mg of potassium  Tea, brewed - 6 oz has 65 mg of potassium  Turnips -  cup has 140 mg of potassium  Watermelon -  cup has 85 mg of potassium  Wine (Red, table) - 5 oz has 180 mg of potassium  Wine (White, table) - 5 oz 100 mg of potassium Low in potassium The following foods and beverages have less than 50 mg of potassium per serving.  Bread (white) - 1 slice has 30 mg of potassium  Carbonated beverages - 12 oz has less than 5 mg of potassium  Cheese - 1 oz has 20-30  mg of potassium  Cranberries -  cup has 45 mg of potassium  Cranberry juice cocktail -  cup has 20 mg of potassium  Fats and oils - 1 Tbsp has less than 5 mg of potassium  Hummus - 1 Tbsp has 32 mg of potassium  Nectar (papaya, mango, or pear) -  cup has 35 mg of potassium  Rice (white or brown) -  cup has 50 mg of potassium  Spaghetti or macaroni (cooked) -  cup has 30 mg of potassium  Tortilla, flour or corn - 1 has 50 mg of potassium  Waffle - 1 four-inch waffle has 50 mg of potassium  Water chestnuts -  cup has 40 mg of potassium Summary  Potassium is a mineral found in many foods and drinks. It affects how the heart works, and helps keep fluids and minerals balanced in the body.  The amount of potassium you need each day depends on your age and any existing medical conditions you may have. Your health care provider or dietitian may recommend an amount of potassium that you should have each day. This information is not intended to replace advice given to you by your health care provider. Make sure you discuss any questions you have with your health care provider. Document Released: 01/16/2005 Document Revised: 05/17/2017 Document Reviewed: 08/29/2016 Elsevier Patient Education  [place].

## 2019-01-08 NOTE — Progress Notes (Signed)
Subjective:    Patient ID: Leah Osborne, female    DOB: [birth year], 33 y.o.   MRN: [id number]  Leah Osborne is a 33 y.o. female presenting on 01/08/2019 for Hospitalization Follow-up (hypokalmia, dehydration) and Emesis (as per patient onset 06/03, dizziness, soreness in heart while throwing up)   HPI   HOSPITAL FOLLOW-UP VISIT  Hospital/Location: [place] Date of Admission: 01/01/19 Date of Discharge: 01/02/19 Transitions of care telephone call: Not completed  Reason for Admission: Dehydration, nausea vomiting   [facility] H&P and Discharge Summary have been reviewed - Patient presents today about 6 days after recent hospitalization. See last office visit from me on 01/01/19 day of admission. - Treated in hospital with IVF rehydration, IV antiemetic. Overall improvement, had lab testing, showed low K, replete, discharged.   - Today reports overall has not improved since discharge. Symptoms of nausea vomiting still persistent, she still cannot take in any PO, only small sips but frequent still has nausea and vomiting. Admits dizziness. Recent lab 7/22, 1 week after hospital checked for chemistry showed normal kidney function and improved K up to 3.4. Cannot tolerate K liquid either still vomit, described vomit has mostly clear some thicker liquid, initially some green. Persistent dizziness, difficulty walking. - She has not notified her [facility] Bariatric Surgeon that she was hospitalized.   Depression screen [facility] 2/9 01/08/2019 10/21/2018 09/30/2018  Decreased Interest 0 0 0  Down, Depressed, Hopeless 0 0 0  PHQ - 2 Score 0 0 0  Altered sleeping - 0 0  Tired, decreased energy - 0 0  Change in appetite - 0 0  Feeling bad or failure about yourself  - 0 0  Trouble concentrating - 0 2  Moving slowly or fidgety/restless - 2 0  Suicidal thoughts - 0 0  PHQ-9 Score - 2 2  Difficult doing work/chores - Somewhat difficult Somewhat difficult    Social History   Tobacco Use   . Smoking status: Never Smoker  . Smokeless tobacco: Never Used  Substance Use Topics  . Alcohol use: Not Currently    Alcohol/week: 0.0 standard drinks  . Drug use: No    Review of Systems Per HPI unless specifically indicated above     Objective:    BP (!) 108/93   Pulse (!) 107   Temp 98.3 F (36.8 C) (Oral)   Resp 16   Ht 5\' 1"  (1.549 m)   BMI 50.64 kg/m   Wt Readings from Last 3 Encounters:  01/01/19 268 lb (121.6 kg)  11/05/18 (!) 307 lb (139.3 kg)  10/21/18 (!) 310 lb (140.6 kg)    Physical Exam Vitals signs and nursing note reviewed.  Constitutional:      General: She is not in acute distress.    Appearance: She is well-developed. She is not diaphoretic.     Comments: Well-appearing, comfortable, cooperative, obese  HENT:     Head: Normocephalic and atraumatic.  Eyes:     General:        Right eye: No discharge.        Left eye: No discharge.     Conjunctiva/sclera: Conjunctivae normal.  Cardiovascular:     Rate and Rhythm: Normal rate.  Pulmonary:     Effort: Pulmonary effort is normal.  Skin:    General: Skin is warm and dry.     Findings: No erythema or rash.  Neurological:     Mental Status: She is alert and oriented to person, place, and  time.     Comments: Appears slightly off balance and dizzy with attempting to ambulate, needs assistance, use wheelchair  Psychiatric:        Behavior: Behavior normal.     Comments: Well groomed, good eye contact, normal speech and thoughts    Results for orders placed or performed in visit on 01/07/19  COMPLETE METABOLIC PANEL WITH GFR  Result Value Ref Range   Glucose, Bld 76 65 - 99 mg/dL   BUN 9 7 - 25 mg/dL   Creat 0.73 0.50 - 1.10 mg/dL   GFR, Est Non African American 111 > OR = 60 mL/min/1.52m2   GFR, Est African American 128 > OR = 60 mL/min/1.76m2   BUN/Creatinine Ratio NOT APPLICABLE 6 - 22 (calc)   Sodium 139 135 - 146 mmol/L   Potassium 3.4 (L) 3.5 - 5.3 mmol/L   Chloride 95 (L) 98 - 110  mmol/L   CO2 26 20 - 32 mmol/L   Calcium 9.3 8.6 - 10.2 mg/dL   Total Protein 5.8 (L) 6.1 - 8.1 g/dL   Albumin 3.7 3.6 - 5.1 g/dL   Globulin 2.1 1.9 - 3.7 g/dL (calc)   AG Ratio 1.8 1.0 - 2.5 (calc)   Total Bilirubin 0.7 0.2 - 1.2 mg/dL   Alkaline phosphatase (APISO) 57 31 - 125 U/L   AST 22 10 - 30 U/L   ALT 18 6 - 29 U/L      Assessment & Plan:   Problem List Items Addressed This Visit    Hypokalemia - Primary   S/P laparoscopic sleeve gastrectomy    Other Visit Diagnoses    Non-intractable vomiting with nausea, unspecified vomiting type       Relevant Medications   promethazine (PHENERGAN) 25 MG suppository   Dizziness       Relevant Medications   meclizine (ANTIVERT) 25 MG tablet   scopolamine (TRANSDERM-SCOP) 1 MG/3DAYS      Clinically concerning persistent dizziness, nausea vomiting following bariatric surgery gastric sleeve now 3 weeks postop - At risk dehydration again, very limited PO intake - Unable to tolerate medications including anti emetic or ineffective - HypoK has nearly resolved, improved in hospital and with some PO K liquid but cannot keep down S/p IVF rehydration in hospital 7/16  Plan - Trial on Meclizine PO / also offered scopolamine topical patch q 72 hr for dizziness if cannot tolerate PO med - Rx Phenergan Suppository PRN, due to cannot keep other PO anti emetic down - Gradually improve hydration, nutrition as advised - Keep on K liquid for now up to 2-3 more weeks, K rich diet if possible in future - Recommend that she contact her [facility] Bariatric surgeon to notify them she was in hospital, ultimately I think that they may need to re-evaluate her or she may warrant future return to hospital if dehydration, precautions reviewed.   Meds ordered this encounter  Medications  . meclizine (ANTIVERT) 25 MG tablet    Sig: Take 1 tablet (25 mg total) by mouth 3 (three) times daily as needed for dizziness.    Dispense:  30 tablet    Refill:  1  .  scopolamine (TRANSDERM-SCOP) 1 MG/3DAYS    Sig: Place 1 patch (1.5 mg total) onto the skin every 3 (three) days. For motion sickness / dizziness    Dispense:  4 patch    Refill:  1  . promethazine (PHENERGAN) 25 MG suppository    Sig: Place 1 suppository (25 mg total) rectally every  8 (eight) hours as needed for nausea or vomiting.    Dispense:  12 suppository    Refill:  1      Follow up plan: Return in about 2 weeks (around 01/22/2019), or if symptoms worsen or fail to improve, for nausea vomiting.   Nobie Putnam, [facility] 01/08/2019, 11:57 AM

## 2019-01-09 ENCOUNTER — Ambulatory Visit: Payer: BC Managed Care – PPO | Admitting: Family Medicine

## 2019-01-09 NOTE — Unmapped (Signed)
A user error has taken place: encounter opened in error, closed for administrative reasons.

## 2019-01-09 NOTE — Unmapped (Signed)
Patient called to update Dr. Augustine Radar and ask if he has any further advise regarding nausea/vomiting as she was recently admitted to [facility] for dehydration.  She states they gave her phenergan suppositories and scopolamine patches.  She states she has continued to have nausea and vomiting since about 2 weeks post surgery on 11/19/2018.  She reports she vomits daily and feels she vomits more liquid than she has taken in.  I told the patient I would notify Dr. Augustine Radar and update her with his advice.  She verbalized understanding.    Routed this message to Dr. Augustine Radar.

## 2019-01-10 ENCOUNTER — Observation Stay
Admission: EM | Admit: 2019-01-10 | Discharge: 2019-01-12 | Disposition: A | Discharge status: Home / Self Care | Payer: BC Managed Care – PPO | Attending: Internal Medicine | Admitting: Internal Medicine

## 2019-01-10 ENCOUNTER — Other Ambulatory Visit: Payer: Self-pay

## 2019-01-10 ENCOUNTER — Encounter: Payer: Self-pay | Admitting: Emergency Medicine

## 2019-01-10 DIAGNOSIS — E039 Hypothyroidism, unspecified: Secondary | ICD-10-CM | POA: Diagnosis present

## 2019-01-10 DIAGNOSIS — I493 Ventricular premature depolarization: Secondary | ICD-10-CM | POA: Insufficient documentation

## 2019-01-10 DIAGNOSIS — R112 Nausea with vomiting, unspecified: Principal | ICD-10-CM | POA: Diagnosis present

## 2019-01-10 DIAGNOSIS — R079 Chest pain, unspecified: Secondary | ICD-10-CM | POA: Diagnosis not present

## 2019-01-10 DIAGNOSIS — Z79890 Hormone replacement therapy (postmenopausal): Secondary | ICD-10-CM | POA: Insufficient documentation

## 2019-01-10 DIAGNOSIS — R1031 Right lower quadrant pain: Secondary | ICD-10-CM | POA: Diagnosis not present

## 2019-01-10 DIAGNOSIS — I10 Essential (primary) hypertension: Secondary | ICD-10-CM | POA: Diagnosis present

## 2019-01-10 DIAGNOSIS — Z6841 Body Mass Index (BMI) 40.0 and over, adult: Secondary | ICD-10-CM | POA: Diagnosis not present

## 2019-01-10 DIAGNOSIS — Z9884 Bariatric surgery status: Secondary | ICD-10-CM | POA: Diagnosis not present

## 2019-01-10 DIAGNOSIS — N3000 Acute cystitis without hematuria: Secondary | ICD-10-CM | POA: Insufficient documentation

## 2019-01-10 DIAGNOSIS — Z20828 Contact with and (suspected) exposure to other viral communicable diseases: Secondary | ICD-10-CM | POA: Diagnosis not present

## 2019-01-10 DIAGNOSIS — Z9114 Patient's other noncompliance with medication regimen: Secondary | ICD-10-CM | POA: Diagnosis not present

## 2019-01-10 DIAGNOSIS — M069 Rheumatoid arthritis, unspecified: Secondary | ICD-10-CM | POA: Insufficient documentation

## 2019-01-10 DIAGNOSIS — Z79899 Other long term (current) drug therapy: Secondary | ICD-10-CM | POA: Insufficient documentation

## 2019-01-10 DIAGNOSIS — F319 Bipolar disorder, unspecified: Secondary | ICD-10-CM | POA: Insufficient documentation

## 2019-01-10 DIAGNOSIS — Z9049 Acquired absence of other specified parts of digestive tract: Secondary | ICD-10-CM | POA: Diagnosis not present

## 2019-01-10 DIAGNOSIS — E785 Hyperlipidemia, unspecified: Secondary | ICD-10-CM | POA: Insufficient documentation

## 2019-01-10 DIAGNOSIS — Z8249 Family history of ischemic heart disease and other diseases of the circulatory system: Secondary | ICD-10-CM | POA: Insufficient documentation

## 2019-01-10 DIAGNOSIS — R002 Palpitations: Secondary | ICD-10-CM | POA: Diagnosis not present

## 2019-01-10 DIAGNOSIS — F4310 Post-traumatic stress disorder, unspecified: Secondary | ICD-10-CM | POA: Diagnosis not present

## 2019-01-10 DIAGNOSIS — E876 Hypokalemia: Secondary | ICD-10-CM | POA: Diagnosis present

## 2019-01-10 DIAGNOSIS — Z1159 Encounter for screening for other viral diseases: Secondary | ICD-10-CM | POA: Insufficient documentation

## 2019-01-10 DIAGNOSIS — R7303 Prediabetes: Secondary | ICD-10-CM | POA: Insufficient documentation

## 2019-01-10 HISTORY — DX: Nausea with vomiting, unspecified: R11.2

## 2019-01-10 LAB — CBC
HCT: 49.5 % — ABNORMAL HIGH (ref 36.0–46.0)
Hemoglobin: 16.6 g/dL — ABNORMAL HIGH (ref 12.0–15.0)
MCH: 28.2 pg (ref 26.0–34.0)
MCHC: 33.5 g/dL (ref 30.0–36.0)
MCV: 84.2 fL (ref 80.0–100.0)
Platelets: 378 10*3/uL (ref 150–400)
RBC: 5.88 MIL/uL — ABNORMAL HIGH (ref 3.87–5.11)
RDW: 15.1 % (ref 11.5–15.5)
WBC: 10.3 10*3/uL (ref 4.0–10.5)
nRBC: 0 % (ref 0.0–0.2)

## 2019-01-10 LAB — URINALYSIS, COMPLETE (UACMP) WITH MICROSCOPIC
Glucose, UA: NEGATIVE mg/dL
Ketones, ur: 80 mg/dL — AB
Nitrite: NEGATIVE
Protein, ur: 100 mg/dL — AB
Specific Gravity, Urine: 1.028 (ref 1.005–1.030)
pH: 6 (ref 5.0–8.0)

## 2019-01-10 LAB — COMPREHENSIVE METABOLIC PANEL WITH GFR
ALT: 28 U/L (ref 0–44)
AST: 30 U/L (ref 15–41)
Albumin: 4.1 g/dL (ref 3.5–5.0)
Alkaline Phosphatase: 71 U/L (ref 38–126)
Anion gap: 18 — ABNORMAL HIGH (ref 5–15)
BUN: 9 mg/dL (ref 6–20)
CO2: 23 mmol/L (ref 22–32)
Calcium: 9.1 mg/dL (ref 8.9–10.3)
Chloride: 97 mmol/L — ABNORMAL LOW (ref 98–111)
Creatinine, Ser: 0.71 mg/dL (ref 0.44–1.00)
GFR calc Af Amer: >60 mL/min (ref >60)
GFR calc non Af Amer: >60 mL/min (ref >60)
Glucose, Bld: 91 mg/dL (ref 70–99)
Potassium: 3.2 mmol/L — ABNORMAL LOW (ref 3.5–5.1)
Sodium: 138 mmol/L (ref 135–145)
Total Bilirubin: 1.5 mg/dL — ABNORMAL HIGH (ref 0.3–1.2)
Total Protein: 7.3 g/dL (ref 6.5–8.1)

## 2019-01-10 LAB — LIPASE, BLOOD: Lipase: 76 U/L — ABNORMAL HIGH (ref 11–51)

## 2019-01-10 LAB — SARS CORONAVIRUS 2 BY RT PCR (HOSPITAL ORDER, PERFORMED IN ~~LOC~~ HOSPITAL LAB): SARS Coronavirus 2: NEGATIVE

## 2019-01-10 MED ORDER — SODIUM CHLORIDE 0.9 % IV SOLN
Freq: Once | INTRAVENOUS | Status: AC
  Administered 2019-01-10: 22:00:00 via INTRAVENOUS

## 2019-01-10 MED ORDER — DEXTROSE IN LACTATED RINGERS 5 % IV SOLN
INTRAVENOUS | Status: DC
  Administered 2019-01-11: 01:00:00 via INTRAVENOUS

## 2019-01-10 MED ORDER — PROMETHAZINE HCL 25 MG/ML IJ SOLN
25.0000 mg | Freq: Once | INTRAMUSCULAR | Status: AC
  Administered 2019-01-10: 22:00:00 25 mg via INTRAVENOUS
  Filled 2019-01-10: qty 1

## 2019-01-10 MED ORDER — SODIUM CHLORIDE 0.9% FLUSH
3.0000 mL | Freq: Once | INTRAVENOUS | Status: AC
  Administered 2019-01-10: 22:00:00 3 mL via INTRAVENOUS

## 2019-01-10 MED ORDER — HYDROMORPHONE HCL 1 MG/ML IJ SOLN: 0.5000 mg | Freq: Once | INTRAMUSCULAR | Status: DC

## 2019-01-10 MED ORDER — ONDANSETRON HCL 4 MG/2ML IJ SOLN: 4.0000 mg | Freq: Once | INTRAMUSCULAR | Status: DC

## 2019-01-10 MED ORDER — ONDANSETRON HCL 4 MG/2ML IJ SOLN
4.0000 mg | Freq: Once | INTRAMUSCULAR | Status: AC | PRN
  Administered 2019-01-10: 4 mg via INTRAVENOUS
  Filled 2019-01-10: qty 2

## 2019-01-10 NOTE — H&P (Signed)
[facility] [place] NAME: Leah Osborne    MR#:  [id number]  DATE OF BIRTH:  [birth year]  DATE OF ADMISSION:  01/10/2019  PRIMARY CARE PHYSICIAN: Olin Hauser, DO   REQUESTING/REFERRING PHYSICIAN: Jimmye Norman, MD  CHIEF COMPLAINT:   Chief Complaint  Patient presents with  . Dizziness  . Chest Pain  . Emesis  . Nausea    HISTORY OF PRESENT ILLNESS:  Leah Osborne  is a 33 y.o. female who presents with chief complaint as above.  Patient presents the ED with a complaint of intractable nausea and vomiting.  This is a recurring thing for her.  She had a gastric sleeve procedure done sometime ago, and has had recurring bouts of nausea and vomiting since then.  She was given several doses of antiemetics here in the ED and still has persistent symptoms.  Hospitalist called for admission  PAST MEDICAL HISTORY:   Past Medical History:  Diagnosis Date  . Abnormal Pap smear of cervix   . Arthritis   . Arthritis, rheumatoid ([place])   . Bipolar 1 disorder ([place])   . Chest pain   . Headache    MIGRAINES  . Hyperlipidemia   . Hypertension   . Palpitations    a. 09/2017 Zio Monitor: predominantly RSR, avg HR 87, 11 A tach/SVT episodes, fastest 176 x 7 beats, longest 9 beats. Rare PAC's, occas PVC's, rare couplets/triplets.  Marland Kitchen PTSD (post-traumatic stress disorder)   . PVC (premature ventricular contraction)   . Social phobia      PAST SURGICAL HISTORY:   Past Surgical History:  Procedure Laterality Date  . CHOLECYSTECTOMY    . LAPAROSCOPIC GASTRIC SLEEVE RESECTION  11/19/2018  . LAPAROSCOPIC TUBAL LIGATION Bilateral 09/19/2015   Procedure: LAPAROSCOPIC BILATERAL TUBAL BANDING;  Surgeon: Brayton Mars, MD;  Location: [facility] ORS;  Service: Gynecology;  Laterality: Bilateral;     SOCIAL HISTORY:   Social History   Tobacco Use  . Smoking status: Never Smoker  . Smokeless tobacco: Never Used  Substance Use  Topics  . Alcohol use: Not Currently    Alcohol/week: 0.0 standard drinks     FAMILY HISTORY:   Family History  Problem Relation Age of Onset  . Diabetes Maternal Grandmother   . Heart disease Maternal Grandmother   . Heart failure Maternal Grandmother   . Heart attack Maternal Grandmother   . Rheum arthritis Mother   . Healthy Father   . Heart Problems Maternal Grandfather   . Atrial fibrillation Paternal Grandmother   . Cancer Neg Hx      DRUG ALLERGIES:  No Known Allergies  MEDICATIONS AT HOME:   Prior to Admission medications   Medication Sig Start Date End Date Taking? Authorizing Provider  Adalimumab (HUMIRA PEN [place]) Inject into the skin.    [provider]  Elagolix Sodium (ORILISSA) 150 MG TABS Take 150 mg by mouth daily. Patient not taking: Reported on 01/08/2019 08/27/18   Rubie Maid, MD  esomeprazole (NEXIUM) 40 MG capsule Take 40 mg by mouth daily.  12/25/18 06/23/19  [provider]  lamoTRIgine (LAMICTAL) 200 MG tablet Take 200 mg by mouth daily.    [provider]  levothyroxine (SYNTHROID, LEVOTHROID) 25 MCG tablet Take 1 tablet (25 mcg total) by mouth daily before breakfast. Patient not taking: Reported on 01/08/2019 05/08/18   Olin Hauser, DO  magnesium oxide (MAG-OX) 400 MG tablet Take 1 tablet (400 mg total) by mouth daily. Patient  not taking: Reported on 01/08/2019 07/10/18   Deboraha Sprang, MD  meclizine (ANTIVERT) 25 MG tablet Take 1 tablet (25 mg total) by mouth 3 (three) times daily as needed for dizziness. 01/08/19   Karamalegos, Devonne Doughty, DO  mirtazapine (REMERON) 15 MG tablet Take 15 mg by mouth at bedtime.    [provider]  potassium chloride 20 MEQ/15ML (10%) SOLN Take 7.5 mLs (10 mEq total) by mouth daily. Patient not taking: Reported on 01/08/2019 01/05/19 02/04/19  Olin Hauser, DO  promethazine (PHENERGAN) 25 MG suppository Place 1 suppository (25 mg total) rectally every 8 (eight)  hours as needed for nausea or vomiting. 01/08/19   Parks Ranger, Devonne Doughty, DO  propranolol ER (INDERAL LA) 60 MG 24 hr capsule Take 1 capsule (60 mg total) by mouth daily. Patient not taking: Reported on 01/08/2019 07/10/18   Deboraha Sprang, MD  scopolamine (TRANSDERM-SCOP) 1 MG/3DAYS Place 1 patch (1.5 mg total) onto the skin every 3 (three) days. For motion sickness / dizziness 01/08/19   Olin Hauser, DO  spironolactone (ALDACTONE) 50 MG tablet TAKE 1 TABLET BY MOUTH TWICE A DAY Patient not taking: Reported on 01/08/2019 09/29/18   Rubie Maid, MD  sucralfate (CARAFATE) 1 GM/10ML suspension Take 1 g by mouth 4 (four) times daily.  12/25/18 01/24/19  [provider]  topiramate (TOPAMAX) 25 MG tablet Take 50 mg by mouth daily. 50MG  ONCE DAILY    [provider]    REVIEW OF SYSTEMS:  Review of Systems  Constitutional: Negative for chills, fever, malaise/fatigue and weight loss.  HENT: Negative for ear pain, hearing loss and tinnitus.   Eyes: Negative for blurred vision, double vision, pain and redness.  Respiratory: Negative for cough, hemoptysis and shortness of breath.   Cardiovascular: Negative for chest pain, palpitations, orthopnea and leg swelling.  Gastrointestinal: Positive for abdominal pain, nausea and vomiting. Negative for constipation and diarrhea.  Genitourinary: Negative for dysuria, frequency and hematuria.  Musculoskeletal: Negative for back pain, joint pain and neck pain.  Skin:       No acne, rash, or lesions  Neurological: Negative for dizziness, tremors, focal weakness and weakness.  Endo/Heme/Allergies: Negative for polydipsia. Does not bruise/bleed easily.  Psychiatric/Behavioral: Negative for depression. The patient is not nervous/anxious and does not have insomnia.      VITAL SIGNS:   Vitals:   01/10/19 1911 01/10/19 2030 01/10/19 2100 01/10/19 2130  BP:  116/76 113/70 121/76  Pulse:  61 (!) 59 60  Resp:  (!) 22 19 20   Temp:       TempSrc:      SpO2:  99% 100% 100%  Weight: 117 kg     Height: 5\' 1"  (1.549 m)      Wt Readings from Last 3 Encounters:  01/10/19 117 kg  01/01/19 121.6 kg  11/05/18 (!) 139.3 kg    PHYSICAL EXAMINATION:  Physical Exam  Vitals reviewed. Constitutional: She is oriented to person, place, and time. She appears well-developed and well-nourished. No distress.  HENT:  Head: Normocephalic and atraumatic.  Mouth/Throat: Oropharynx is clear and moist.  Eyes: Pupils are equal, round, and reactive to light. Conjunctivae and EOM are normal. No scleral icterus.  Neck: Normal range of motion. Neck supple. No JVD present. No thyromegaly present.  Cardiovascular: Normal rate, regular rhythm and intact distal pulses. Exam reveals no gallop and no friction rub.  No murmur heard. Respiratory: Effort normal and breath sounds normal. No respiratory distress. She has no wheezes.  She has no rales.  GI: Soft. Bowel sounds are normal. She exhibits no distension. There is no abdominal tenderness.  Musculoskeletal: Normal range of motion.        General: No edema.     Comments: No arthritis, no gout  Lymphadenopathy:    She has no cervical adenopathy.  Neurological: She is alert and oriented to person, place, and time. No cranial nerve deficit.  No dysarthria, no aphasia  Skin: Skin is warm and dry. No rash noted. No erythema.  Psychiatric: She has a normal mood and affect. Her behavior is normal. Judgment and thought content normal.    LABORATORY PANEL:   CBC Recent Labs  Lab 01/10/19 1935  WBC 10.3  HGB 16.6*  HCT 49.5*  PLT 378   ------------------------------------------------------------------------------------------------------------------  Chemistries  Recent Labs  Lab 01/10/19 1935  NA 138  K 3.2*  CL 97*  CO2 23  GLUCOSE 91  BUN 9  CREATININE 0.71  CALCIUM 9.1  AST 30  ALT 28  ALKPHOS 71  BILITOT 1.5*    ------------------------------------------------------------------------------------------------------------------  Cardiac Enzymes No results for input(s): TROPONINI in the last 168 hours. ------------------------------------------------------------------------------------------------------------------  RADIOLOGY:  No results found.  EKG:   Orders placed or performed during the hospital encounter of 01/10/19  . ED EKG  . ED EKG  . EKG 12-Lead  . EKG 12-Lead    IMPRESSION AND PLAN:  Principal Problem:   Intractable nausea and vomiting -PRN antiemetics, IV fluids for hydration Active Problems:   Hypertension -home dose antihypertensives   Hypokalemia -very mild, replace potassium and monitor   Hypothyroidism -home dose thyroid replacement   Dyslipidemia -home dose antilipid  Chart review performed and case discussed with ED provider. Labs, imaging and/or ECG reviewed by provider and discussed with patient/family. Management plans discussed with the patient and/or family.  COVID-19 status: Pending  DVT PROPHYLAXIS: SubQ lovenox   GI PROPHYLAXIS:  None  ADMISSION STATUS: Observation  CODE STATUS: Full Code Status History    Date Active Date Inactive Code Status Order ID Comments User Context   01/01/2019 1642 01/02/2019 2012 Full Code [id number]  Saundra Shelling, MD ED   10/23/2016 1446 10/24/2016 1607 Full Code [id number]  Clovis Fredrickson, MD Inpatient   10/22/2016 1708 10/23/2016 1312 Full Code [id number]  Recardo Evangelist, PA-C ED   Advance Care Planning Activity      TOTAL TIME TAKING CARE OF THIS PATIENT: 40 minutes.   This patient was evaluated in the context of the global COVID-19 pandemic, which necessitated consideration that the patient might be at risk for infection with the SARS-CoV-2 virus that causes COVID-19. Institutional protocols and algorithms that pertain to the evaluation of patients at risk for COVID-19 are in a state of rapid change based on  information released by regulatory bodies including the CDC and federal and state organizations. These policies and algorithms were followed to the best of this provider's knowledge to date during the patient's care at this facility.  Leah Osborne 01/10/2019, 10:41 PM  [facility]  Office  [phone]  CC: Primary care physician; Olin Hauser, DO  Note:  This document was prepared using Dragon voice recognition software and may include unintentional dictation errors.

## 2019-01-10 NOTE — ED Notes (Addendum)
Pt resting at this times denies any needs

## 2019-01-10 NOTE — ED Notes (Signed)
Pt presents to ER from home reports June 3th, had gastric sleeve procedure reports she was admitted in the hospital last week due to intractable nausea and vomiting reports discharged on Friday and soon after arriving home started to have N/V, pt talks in complete sentences no distress noted

## 2019-01-10 NOTE — ED Notes (Signed)
This Rn spoke with Dr. Jannifer Franklin about repeat Coronavirus 2 SARS test, pt informed RN she was told she may not need to get test done, per Dr. Jannifer Franklin orders to repeat test since pt had it done last Thursday

## 2019-01-10 NOTE — ED Notes (Signed)
FIRST NURSE NOTE:  Pt reports vomiting and recent admission to hospital. Pt reports she had gastric sleeve completed in June as well. Vomiting bile.

## 2019-01-10 NOTE — ED Provider Notes (Signed)
[facility] Emergency Department Provider Note       Time seen: ----------------------------------------- 7:42 PM on 01/10/2019 -----------------------------------------   I have reviewed the triage vital signs and the nursing notes.  HISTORY   Chief Complaint Dizziness, Chest Pain, Emesis, and Nausea    HPI Leah Osborne is a 33 y.o. female with a history of arthritis, bipolar disorder, chest pain, hyperlipidemia, hypertension, PTSD who presents to the ED for nausea and vomiting that is been persistent.  She developed chest pain on Tuesday and is having right lower quadrant abdominal pain.  Patient reports today she is been vomiting every 30 to 60 minutes.  She had a gastric sleeve placed a month and a half ago and was recently admitted here for nausea, vomiting and dehydration.  Past Medical History:  Diagnosis Date  . Abnormal Pap smear of cervix   . Arthritis   . Arthritis, rheumatoid ([place])   . Bipolar 1 disorder ([place])   . Chest pain   . Headache    MIGRAINES  . Hyperlipidemia   . Hypertension   . Palpitations    a. 09/2017 Zio Monitor: predominantly RSR, avg HR 87, 11 A tach/SVT episodes, fastest 176 x 7 beats, longest 9 beats. Rare PAC's, occas PVC's, rare couplets/triplets.  Marland Kitchen PTSD (post-traumatic stress disorder)   . PVC (premature ventricular contraction)   . Social phobia     Patient Active Problem List   Diagnosis Date Noted  . 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 Hypokalemia 01/01/2019  . Elevated hemoglobin A1c 02/12/2018  . Dyslipidemia 02/05/2018  . Abnormal skin growth 11/15/2016  . PTSD (post-traumatic stress disorder) 10/24/2016  . Severe bipolar I disorder, most recent episode depressed without psychotic features ([place]) 10/23/2016  . Ankylosing spondylitis ([place]) 10/23/2016  . Sacroiliac inflammation ([place]) 08/02/2016  . Hypertension 05/28/2016  . Vitamin D deficiency 05/02/2016  . Osteitis condensans ilii  02/27/2016  . 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 Hypothyroidism 02/13/2016  . Anxiety and depression 01/23/2016  . Status post tubal ligation 09/22/2015  . Morbid (severe) obesity due to excess calories ([place]) 04/25/2015  . Anemia, iron deficiency 01/04/2015  . Headache, migraine 01/04/2015  . Abnormal cytological findings in female genital organs 07/04/2011    Past Surgical History:  Procedure Laterality Date  . CHOLECYSTECTOMY    . LAPAROSCOPIC GASTRIC SLEEVE RESECTION  11/19/2018  . LAPAROSCOPIC TUBAL LIGATION Bilateral 09/19/2015   Procedure: LAPAROSCOPIC BILATERAL TUBAL BANDING;  Surgeon: Brayton Mars, MD;  Location: [facility] ORS;  Service: Gynecology;  Laterality: Bilateral;    Allergies Patient has no known allergies.  Social History Social History   Tobacco Use  . Smoking status: Never Smoker  . Smokeless tobacco: Never Used  Substance Use Topics  . Alcohol use: Not Currently    Alcohol/week: 0.0 standard drinks  . Drug use: No   Review of Systems Constitutional: Negative for fever. Cardiovascular: Positive for chest pain Respiratory: Negative for shortness of breath. Gastrointestinal: Positive for abdominal pain, vomiting Musculoskeletal: Negative for back pain. Skin: Negative for rash. Neurological: Negative for headaches, focal weakness or numbness.  All systems negative/normal/unremarkable except as stated in the HPI  ____________________________________________  EKG: Turbid by me, sinus rhythm with a rate of 87 bpm, nonspecific T wave changes, normal axis, normal QT  PHYSICAL EXAM:  VITAL SIGNS: ED Triage Vitals  Enc Vitals Group     BP 01/10/19 1909 (!) 147/99     Pulse Rate 01/10/19 1909 99     Resp  01/10/19 1909 18     Temp 01/10/19 1909 98.3 F (36.8 C)     Temp Source 01/10/19 1909 Oral     SpO2 01/10/19 1909 98 %     Weight 01/10/19 1911 258 lb (117 kg)     Height 01/10/19 1911 5\' 1"  (1.549 m)     Head Circumference --      Peak Flow --       Pain Score 01/10/19 1910 5     Pain Loc --      Pain Edu? --      Excl. in [place]? --    Constitutional: Alert and oriented.  Mild distress Eyes: Conjunctivae are normal. Normal extraocular movements. Cardiovascular: Normal rate, regular rhythm. No murmurs, rubs, or gallops. Respiratory: Normal respiratory effort without tachypnea nor retractions. Breath sounds are clear and equal bilaterally. No wheezes/rales/rhonchi. Gastrointestinal: Soft and nontender. Normal bowel sounds Musculoskeletal: Nontender with normal range of motion in extremities. No lower extremity tenderness nor edema. Neurologic:  Normal speech and language. No gross focal neurologic deficits are appreciated.  Skin:  Skin is warm, dry and intact. No rash noted. Psychiatric: Mood and affect are normal. Speech and behavior are normal.  ____________________________________________  ED COURSE:  As part of my medical decision making, I reviewed the following data within the [place] History obtained from family if available, nursing notes, old chart and ekg, as well as notes from prior ED visits. Patient presented for nausea and vomiting, we will assess with labs and imaging as indicated at this time.   Procedures  [place] was evaluated in Emergency Department on 01/10/2019 for the symptoms described in the history of present illness. She was evaluated in the context of the global COVID-19 pandemic, which necessitated consideration that the patient might be at risk for infection with the SARS-CoV-2 virus that causes COVID-19. Institutional protocols and algorithms that pertain to the evaluation of patients at risk for COVID-19 are in a state of rapid change based on information released by regulatory bodies including the CDC and federal and state organizations. These policies and algorithms were followed during the patient's care in the ED.  ____________________________________________   LABS  (pertinent positives/negatives)  Labs Reviewed  LIPASE, BLOOD - Abnormal; Notable for the following components:      Result Value   Lipase 76 (*)    All other components within normal limits  COMPREHENSIVE METABOLIC PANEL - Abnormal; Notable for the following components:   Potassium 3.2 (*)    Chloride 97 (*)    Total Bilirubin 1.5 (*)    Anion gap 18 (*)    All other components within normal limits  CBC - Abnormal; Notable for the following components:   RBC 5.88 (*)    Hemoglobin 16.6 (*)    HCT 49.5 (*)    All other components within normal limits  URINALYSIS, COMPLETE (UACMP) WITH MICROSCOPIC  POC URINE PREG, ED   ____________________________________________   DIFFERENTIAL DIAGNOSIS   Postoperative complication, dehydration, electrolyte abnormality, intractable vomiting  FINAL ASSESSMENT AND PLAN  Intractable vomiting   Plan: The patient had presented for persistent vomiting. Patient's labs did reveal some dehydration and mild elevations in her lipase.  She cannot keep anything down here and will need to be admitted for intractable vomiting and dehydration.Laurence Aly, MD    Note: This note was generated in part or whole with voice recognition software. Voice recognition is usually quite accurate but there  are transcription errors that can and very often do occur. I apologize for any typographical errors that were not detected and corrected.     Earleen Newport, MD 01/10/19 2111

## 2019-01-10 NOTE — ED Triage Notes (Addendum)
Pt to triage via wheelchair. Pt s/p VSG on 11/19/2018. Admitted here last week for n/v and dehydration. Discharged on Friday 01/02/2019. Developed n/v soon after getting home that day. Developed chest pain on Tuesday. Pt also having lower abd pain. Reports today she has been vomiting every 30 to 60 mins.

## 2019-01-11 DIAGNOSIS — E876 Hypokalemia: Secondary | ICD-10-CM | POA: Diagnosis not present

## 2019-01-11 DIAGNOSIS — E039 Hypothyroidism, unspecified: Secondary | ICD-10-CM | POA: Diagnosis not present

## 2019-01-11 DIAGNOSIS — R112 Nausea with vomiting, unspecified: Secondary | ICD-10-CM | POA: Diagnosis not present

## 2019-01-11 DIAGNOSIS — I10 Essential (primary) hypertension: Secondary | ICD-10-CM | POA: Diagnosis not present

## 2019-01-11 LAB — CBC
HCT: 43.6 % (ref 36.0–46.0)
Hemoglobin: 14.4 g/dL (ref 12.0–15.0)
MCH: 27.8 pg (ref 26.0–34.0)
MCHC: 33 g/dL (ref 30.0–36.0)
MCV: 84.2 fL (ref 80.0–100.0)
Platelets: 307 10*3/uL (ref 150–400)
RBC: 5.18 MIL/uL — ABNORMAL HIGH (ref 3.87–5.11)
RDW: 14.7 % (ref 11.5–15.5)
WBC: 10.2 10*3/uL (ref 4.0–10.5)
nRBC: 0 % (ref 0.0–0.2)

## 2019-01-11 LAB — BASIC METABOLIC PANEL
Anion gap: 10 (ref 5–15)
BUN: 7 mg/dL (ref 6–20)
CO2: 27 mmol/L (ref 22–32)
Calcium: 8.3 mg/dL — ABNORMAL LOW (ref 8.9–10.3)
Chloride: 104 mmol/L (ref 98–111)
Creatinine, Ser: 0.57 mg/dL (ref 0.44–1.00)
GFR calc Af Amer: >60 mL/min (ref >60)
GFR calc non Af Amer: >60 mL/min (ref >60)
Glucose, Bld: 103 mg/dL — ABNORMAL HIGH (ref 70–99)
Potassium: 3.1 mmol/L — ABNORMAL LOW (ref 3.5–5.1)
Sodium: 141 mmol/L (ref 135–145)

## 2019-01-11 LAB — MAGNESIUM: Magnesium: 1.9 mg/dL (ref 1.7–2.4)

## 2019-01-11 LAB — VITAMIN B12: Vitamin B-12: 389 pg/mL (ref 180–914)

## 2019-01-11 LAB — TSH: TSH: 4.609 u[IU]/mL — ABNORMAL HIGH (ref 0.350–4.500)

## 2019-01-11 MED ORDER — ACETAMINOPHEN 650 MG RE SUPP: 650.0000 mg | Freq: Four times a day (QID) | RECTAL | Status: DC | PRN

## 2019-01-11 MED ORDER — ACETAMINOPHEN 325 MG PO TABS: 650.0000 mg | ORAL_TABLET | Freq: Four times a day (QID) | ORAL | Status: DC | PRN

## 2019-01-11 MED ORDER — ENOXAPARIN SODIUM 40 MG/0.4ML ~~LOC~~ SOLN
40.0000 mg | Freq: Two times a day (BID) | SUBCUTANEOUS | Status: DC
  Administered 2019-01-11 – 2019-01-12 (×3): 40 mg via SUBCUTANEOUS
  Filled 2019-01-11 (×3): qty 0.4

## 2019-01-11 MED ORDER — SODIUM CHLORIDE 0.9 % IV SOLN
INTRAVENOUS | Status: DC | PRN
  Administered 2019-01-11: 16:00:00 500 mL via INTRAVENOUS

## 2019-01-11 MED ORDER — POTASSIUM CHLORIDE CRYS ER 20 MEQ PO TBCR
40.0000 meq | EXTENDED_RELEASE_TABLET | ORAL | Status: AC
  Administered 2019-01-11: 06:00:00 40 meq via ORAL
  Filled 2019-01-11 (×2): qty 2

## 2019-01-11 MED ORDER — ONDANSETRON HCL 4 MG/2ML IJ SOLN
4.0000 mg | Freq: Four times a day (QID) | INTRAMUSCULAR | Status: DC | PRN
  Administered 2019-01-11 – 2019-01-12 (×5): 4 mg via INTRAVENOUS
  Filled 2019-01-11 (×5): qty 2

## 2019-01-11 MED ORDER — SODIUM CHLORIDE 0.9 % IV SOLN
1.0000 g | INTRAVENOUS | Status: DC
  Administered 2019-01-11 – 2019-01-12 (×2): 1 g via INTRAVENOUS
  Filled 2019-01-11: qty 1
  Filled 2019-01-11: qty 10
  Filled 2019-01-11: qty 1

## 2019-01-11 MED ORDER — SODIUM CHLORIDE 0.9% FLUSH
3.0000 mL | Freq: Two times a day (BID) | INTRAVENOUS | Status: DC
  Administered 2019-01-11: 17:00:00 3 mL via INTRAVENOUS

## 2019-01-11 MED ORDER — SODIUM CHLORIDE 0.9% FLUSH: 3.0000 mL | INTRAVENOUS | Status: DC | PRN

## 2019-01-11 MED ORDER — LEVOTHYROXINE SODIUM 50 MCG PO TABS
25.0000 ug | ORAL_TABLET | Freq: Every day | ORAL | Status: DC
  Administered 2019-01-11 – 2019-01-12 (×2): 25 ug via ORAL
  Filled 2019-01-11 (×2): qty 1

## 2019-01-11 MED ORDER — POTASSIUM CHLORIDE 10 MEQ/100ML IV SOLN
10.0000 meq | INTRAVENOUS | Status: AC
  Administered 2019-01-11 (×4): 10 meq via INTRAVENOUS
  Filled 2019-01-11 (×2): qty 100

## 2019-01-11 MED ORDER — ONDANSETRON HCL 4 MG PO TABS: 4.0000 mg | ORAL_TABLET | Freq: Four times a day (QID) | ORAL | Status: DC | PRN

## 2019-01-11 MED ORDER — METOCLOPRAMIDE HCL 5 MG/ML IJ SOLN: 5.0000 mg | Freq: Four times a day (QID) | INTRAMUSCULAR | Status: DC | PRN

## 2019-01-11 MED ORDER — PANTOPRAZOLE SODIUM 40 MG PO TBEC
40.0000 mg | DELAYED_RELEASE_TABLET | Freq: Every day | ORAL | Status: DC
  Administered 2019-01-11 – 2019-01-12 (×2): 40 mg via ORAL
  Filled 2019-01-11 (×2): qty 1

## 2019-01-11 MED ORDER — METOCLOPRAMIDE HCL 5 MG/ML IJ SOLN
10.0000 mg | Freq: Four times a day (QID) | INTRAMUSCULAR | Status: DC
  Administered 2019-01-11 – 2019-01-12 (×6): 10 mg via INTRAVENOUS
  Filled 2019-01-11 (×6): qty 2

## 2019-01-11 MED ORDER — MIRTAZAPINE 15 MG PO TABS
15.0000 mg | ORAL_TABLET | Freq: Every day | ORAL | Status: DC
  Administered 2019-01-11: 22:00:00 15 mg via ORAL
  Filled 2019-01-11: qty 1

## 2019-01-11 MED ORDER — SODIUM CHLORIDE 0.9 % IV SOLN
INTRAVENOUS | Status: AC
  Administered 2019-01-11: 02:00:00 via INTRAVENOUS

## 2019-01-11 NOTE — Plan of Care (Signed)
  Problem: Activity: Goal: Risk for activity intolerance will decrease Outcome: Progressing   Problem: Pain Managment: Goal: General experience of comfort will improve Outcome: Progressing   Problem: Safety: Goal: Ability to remain free from injury will improve Outcome: Progressing   

## 2019-01-11 NOTE — Progress Notes (Signed)
[place] [facility] [place] NAME: Angelo Caroll    MR#:  [id number]  DATE OF BIRTH:  November 27, 1985  SUBJECTIVE:  CHIEF COMPLAINT:   Chief Complaint  Patient presents with  . Dizziness  . Chest Pain  . Emesis  . Nausea   - complains of ongoing dizziness, nausea and vomiting - some better today  REVIEW OF SYSTEMS:  Review of Systems  Constitutional: Negative for chills, fever and malaise/fatigue.  HENT: Negative for congestion, ear discharge, hearing loss and nosebleeds.   Eyes: Negative for blurred vision and double vision.  Respiratory: Negative for cough, shortness of breath and wheezing.   Cardiovascular: Negative for chest pain and palpitations.  Gastrointestinal: Positive for nausea and vomiting. Negative for abdominal pain, constipation and diarrhea.  Genitourinary: Negative for dysuria.  Musculoskeletal: Negative for myalgias.  Neurological: Positive for dizziness. Negative for focal weakness, seizures, weakness and headaches.  Psychiatric/Behavioral: Negative for depression.    DRUG ALLERGIES:  No Known Allergies  VITALS:  Blood pressure 112/75, pulse 83, temperature 97.7 F (36.5 C), temperature source Oral, resp. rate 16, height 5\' 1"  (1.549 m), weight 117 kg, last menstrual period 11/22/2018, SpO2 98 %.  PHYSICAL EXAMINATION:  Physical Exam   GENERAL:  33 y.o.-year-old obese patient lying in the bed with no acute distress.  EYES: Pupils equal, round, reactive to light and accommodation. No scleral icterus. Extraocular muscles intact.  HEENT: Head atraumatic, normocephalic. Oropharynx and nasopharynx clear.  NECK:  Supple, no jugular venous distention. No thyroid enlargement, no tenderness.  LUNGS: Normal breath sounds bilaterally, no wheezing, rales,rhonchi or crepitation. No use of accessory muscles of respiration.  CARDIOVASCULAR: S1, S2 normal. No murmurs, rubs, or gallops.  ABDOMEN: Soft, nontender, nondistended.  Bowel sounds present. No organomegaly or mass.  EXTREMITIES: No pedal edema, cyanosis, or clubbing.  NEUROLOGIC: Cranial nerves II through XII are intact. Muscle strength 5/5 in all extremities. Sensation intact. Gait not checked.  PSYCHIATRIC: The patient is alert and oriented x 3.  SKIN: No obvious rash, lesion, or ulcer.    LABORATORY PANEL:   CBC Recent Labs  Lab 01/11/19 0416  WBC 10.2  HGB 14.4  HCT 43.6  PLT 307   ------------------------------------------------------------------------------------------------------------------  Chemistries  Recent Labs  Lab 01/10/19 1935 01/11/19 0416  NA 138 141  K 3.2* 3.1*  CL 97* 104  CO2 23 27  GLUCOSE 91 103*  BUN 9 7  CREATININE 0.71 0.57  CALCIUM 9.1 8.3*  AST 30  --   ALT 28  --   ALKPHOS 71  --   BILITOT 1.5*  --    ------------------------------------------------------------------------------------------------------------------  Cardiac Enzymes No results for input(s): TROPONINI in the last 168 hours. ------------------------------------------------------------------------------------------------------------------  RADIOLOGY:  No results found.  EKG:   Orders placed or performed during the hospital encounter of 01/10/19  . ED EKG  . ED EKG  . EKG 12-Lead  . EKG 12-Lead    ASSESSMENT AND PLAN:   33 year old female with past medical history significant for obesity status post sleeve gastrectomy done at [facility] on 11/19/2018, prediabetes, bipolar, hypertension and palpitations presents to hospital secondary to dizziness, nausea and vomiting.  1.  Intractable nausea and vomiting-symptoms started 2 weeks after her sleeve gastrectomy procedure about 6 weeks ago.  On and off nausea vomiting. -Recent hospitalization for the same. -Currently on Zofran, Reglan.  She will need to follow-up with her GI physicians.  She had a follow-up endoscopy done as well for the similar  procedures.  2.  Dizziness-started with nausea  vomiting.  Likely electrolyte abnormalities.  Continue to monitor closely  3.  History of palpitations-multiple PVCs on monitor.  Followed with [facility] cardiology up until recently.  Continue to monitor and correct electrolytes.  4.  Hypothyroidism-continue Synthroid  5.  Acute cystitis-urinary cultures ordered.  On Rocephin  6.  DVT prophylaxis-Lovenox     All the records are reviewed and case discussed with Care Management/Social Workerr. Management plans discussed with the patient, family and they are in agreement.  CODE STATUS: Full Code  TOTAL TIME TAKING CARE OF THIS PATIENT: 38 minutes.   POSSIBLE D/C IN 1-2 DAYS, DEPENDING ON CLINICAL CONDITION.   Gladstone Lighter M.D on 01/11/2019 at 2:39 PM  Between 7am to 6pm - Pager - [phone]  After 6pm go to [email or web address] - password [email or web address]  Office  [phone]  CC: Primary care physician; Olin Hauser, DO

## 2019-01-11 NOTE — Progress Notes (Signed)
PHARMACIST - PHYSICIAN COMMUNICATION  CONCERNING:  Enoxaparin (Lovenox) for DVT Prophylaxis   RECOMMENDATION: Patient was prescribed enoxaprin 40mg  q24 hours for VTE prophylaxis.   Filed Weights   01/10/19 1911  Weight: 258 lb (117 kg)    Body mass index is 48.75 kg/m.  Estimated Creatinine Clearance: 122.6 mL/min (by C-G formula based on SCr of 0.71 mg/dL).  Based on [place] patient is candidate for enoxaparin 40mg  every 12 hour dosing due to BMI being >40.  DESCRIPTION: Pharmacy has adjusted enoxaparin dose per [facility] policy.  Patient is now receiving enoxaparin 40mg  every 12 hours.    Pernell Dupre, PharmD, BCPS Clinical Pharmacist 01/11/2019 1:16 AM

## 2019-01-11 NOTE — Progress Notes (Deleted)
   Cardiology Office Note Date:  01/11/2019  Patient ID:  Leah, Osborne 04-05-1986, MRN [id number] PCP:  Olin Hauser, DO  Cardiologist:  Dr. Rockey Situ, MD  ***refresh   Chief Complaint: ***  History of Present Illness: Leah Osborne is a 33 y.o. female with history of ***   Past Medical History:  Diagnosis Date  . Abnormal Pap smear of cervix   . Arthritis   . Arthritis, rheumatoid ([place])   . Bipolar 1 disorder ([place])   . Chest pain   . Headache    MIGRAINES  . Hyperlipidemia   . Hypertension   . Palpitations    a. 09/2017 Zio Monitor: predominantly RSR, avg HR 87, 11 A tach/SVT episodes, fastest 176 x 7 beats, longest 9 beats. Rare PAC's, occas PVC's, rare couplets/triplets.  Marland Kitchen PTSD (post-traumatic stress disorder)   . PVC (premature ventricular contraction)   . Social phobia     Past Surgical History:  Procedure Laterality Date  . CHOLECYSTECTOMY    . LAPAROSCOPIC GASTRIC SLEEVE RESECTION  11/19/2018  . LAPAROSCOPIC TUBAL LIGATION Bilateral 09/19/2015   Procedure: LAPAROSCOPIC BILATERAL TUBAL BANDING;  Surgeon: Brayton Mars, MD;  Location: [facility] ORS;  Service: Gynecology;  Laterality: Bilateral;    No outpatient medications have been marked as taking for the 01/13/19 encounter (Appointment) with Rise Mu, PA-C.    Allergies:   Patient has no known allergies.   Social History:  The patient  reports that she has never smoked. She has never used smokeless tobacco. She reports previous alcohol use. She reports that she does not use drugs.   Family History:  The patient's family history includes Atrial fibrillation in her paternal grandmother; Diabetes in her maternal grandmother; Healthy in her father; Heart Problems in her maternal grandfather; Heart attack in her maternal grandmother; Heart disease in her maternal grandmother; Heart failure in her maternal grandmother; Rheum arthritis in her mother.  ROS:   ROS   PHYSICAL EXAM: ***  VS:  There were no vitals taken for this visit. BMI: There is no height or weight on file to calculate BMI.  Physical Exam   EKG:  Was ordered and interpreted by me today. Shows ***  Recent Labs: 01/01/2019: Magnesium 2.0 01/10/2019: ALT 28 01/11/2019: BUN 7; Creatinine, Ser 0.57; Hemoglobin 14.4; Platelets 307; Potassium 3.1; Sodium 141; TSH 4.609  10/14/2018: Cholesterol 141; HDL 27; LDL Cholesterol (Calc) 93; Total CHOL/HDL Ratio 5.2; Triglycerides 118   Estimated Creatinine Clearance: 122.6 mL/min (by C-G formula based on SCr of 0.57 mg/dL).   Wt Readings from Last 3 Encounters:  01/10/19 258 lb (117 kg)  01/01/19 268 lb (121.6 kg)  11/05/18 (!) 307 lb (139.3 kg)     Other studies reviewed: Additional studies/records reviewed today include: summarized above  ASSESSMENT AND PLAN:  1. ***  Disposition: F/u with Dr. Rockey Situ or an APP in ***.  Current medicines are reviewed at length with the patient today.  The patient did not have any concerns regarding medicines.  Signed, Christell Faith, PA-C 01/11/2019 9:57 AM     [facility] [address] [place] [address] [place], [place] [address] [phone]

## 2019-01-11 NOTE — ED Notes (Signed)
. ED TO INPATIENT HANDOFF REPORT  ED Nurse Name and Phone #: Torrie Mayers, RN   S Name/Age/Gender Leah Osborne 33 y.o. female Room/Bed: ED07A/ED07A  Code Status   Code Status: Prior  Home/SNF/Other Home Patient oriented to: self, place, time and situation Is this baseline? Yes   Triage Complete: Triage complete  Chief Complaint vomiting, chest pain, s/p gastric sleeve  Triage Note Pt to triage via wheelchair. Pt s/p VSG on 11/19/2018. Admitted here last week for n/v and dehydration. Discharged on Friday 01/02/2019. Developed n/v soon after getting home that day. Developed chest pain on Tuesday. Pt also having lower abd pain. Reports today she has been vomiting every 30 to 60 mins.    Allergies No Known Allergies  Level of Care/Admitting Diagnosis ED Disposition    ED Disposition Condition [facility] Area: [place] [[id number]]  Level of Care: Med-Surg [16]  Covid Evaluation: Asymptomatic Screening Protocol (No Symptoms)  Diagnosis: Intractable nausea and vomiting [419622]  Admitting Physician: Lance Coon [[id number]]  Attending Physician: Lance Coon [[id number]]  PT Class (Do Not Modify): Observation [104]  PT Acc Code (Do Not Modify): Observation [10022]       B Medical/Surgery History Past Medical History:  Diagnosis Date  . Abnormal Pap smear of cervix   . Arthritis   . Arthritis, rheumatoid ([place])   . Bipolar 1 disorder ([place])   . Chest pain   . Headache    MIGRAINES  . Hyperlipidemia   . Hypertension   . Palpitations    a. 09/2017 Zio Monitor: predominantly RSR, avg HR 87, 11 A tach/SVT episodes, fastest 176 x 7 beats, longest 9 beats. Rare PAC's, occas PVC's, rare couplets/triplets.  Marland Kitchen PTSD (post-traumatic stress disorder)   . PVC (premature ventricular contraction)   . Social phobia    Past Surgical History:  Procedure Laterality Date  . CHOLECYSTECTOMY    . LAPAROSCOPIC GASTRIC SLEEVE RESECTION   11/19/2018  . LAPAROSCOPIC TUBAL LIGATION Bilateral 09/19/2015   Procedure: LAPAROSCOPIC BILATERAL TUBAL BANDING;  Surgeon: Brayton Mars, MD;  Location: [facility] ORS;  Service: Gynecology;  Laterality: Bilateral;     A IV Location/Drains/Wounds Patient Lines/Drains/Airways Status   Active Line/Drains/Airways    Name:   Placement date:   Placement time:   Site:   Days:   Peripheral IV 01/10/19 Right Antecubital   01/10/19    1934    Antecubital   1   Incision (Closed) 09/19/15 Abdomen   09/19/15    1517     1210   Incision (Closed) 09/19/15 Vagina   09/19/15    1517     1210          Intake/Output Last 24 hours  Intake/Output Summary (Last 24 hours) at 01/11/2019 0026 Last data filed at 01/11/2019 0026 Gross per 24 hour  Intake 1000 ml  Output -  Net 1000 ml    Labs/Imaging Results for orders placed or performed during the hospital encounter of 01/10/19 (from the past 48 hour(s))  Lipase, blood     Status: Abnormal   Collection Time: 01/10/19  7:35 PM  Result Value Ref Range   Lipase 76 (H) 11 - 51 U/L    Comment: Performed at [facility], [address] [place], [place] [address]  Comprehensive metabolic panel     Status: Abnormal   Collection Time: 01/10/19  7:35 PM  Result Value Ref Range   Sodium 138 135 - 145 mmol/L  Potassium 3.2 (L) 3.5 - 5.1 mmol/L   Chloride 97 (L) 98 - 111 mmol/L   CO2 23 22 - 32 mmol/L   Glucose, Bld 91 70 - 99 mg/dL   BUN 9 6 - 20 mg/dL   Creatinine, Ser 0.71 0.44 - 1.00 mg/dL   Calcium 9.1 8.9 - 10.3 mg/dL   Total Protein 7.3 6.5 - 8.1 g/dL   Albumin 4.1 3.5 - 5.0 g/dL   AST 30 15 - 41 U/L   ALT 28 0 - 44 U/L   Alkaline Phosphatase 71 38 - 126 U/L   Total Bilirubin 1.5 (H) 0.3 - 1.2 mg/dL   GFR calc non Af Amer >60 >60 mL/min   GFR calc Af Amer >60 >60 mL/min   Anion gap 18 (H) 5 - 15    Comment: Performed at [facility], [address] [place], [place] [address]  CBC     Status: Abnormal   Collection Time:  01/10/19  7:35 PM  Result Value Ref Range   WBC 10.3 4.0 - 10.5 K/uL   RBC 5.88 (H) 3.87 - 5.11 MIL/uL   Hemoglobin 16.6 (H) 12.0 - 15.0 g/dL   HCT 49.5 (H) 36.0 - 46.0 %   MCV 84.2 80.0 - 100.0 fL   MCH 28.2 26.0 - 34.0 pg   MCHC 33.5 30.0 - 36.0 g/dL   RDW 15.1 11.5 - 15.5 %   Platelets 378 150 - 400 K/uL   nRBC 0.0 0.0 - 0.2 %    Comment: Performed at [facility], [address] [place], [place] [address]  Urinalysis, Complete w Microscopic     Status: Abnormal   Collection Time: 01/10/19 10:43 PM  Result Value Ref Range   Color, Urine AMBER (A) YELLOW    Comment: BIOCHEMICALS MAY BE AFFECTED BY COLOR   APPearance CLOUDY (A) CLEAR   Specific Gravity, Urine 1.028 1.005 - 1.030   pH 6.0 5.0 - 8.0   Glucose, UA NEGATIVE NEGATIVE mg/dL   Hgb urine dipstick MODERATE (A) NEGATIVE   Bilirubin Urine MODERATE (A) NEGATIVE   Ketones, ur 80 (A) NEGATIVE mg/dL   Protein, ur 100 (A) NEGATIVE mg/dL   Nitrite NEGATIVE NEGATIVE   Leukocytes,Ua SMALL (A) NEGATIVE   RBC / HPF 6-10 0 - 5 RBC/hpf   WBC, UA 21-50 0 - 5 WBC/hpf   Bacteria, UA RARE (A) NONE SEEN   Squamous Epithelial / LPF 6-10 0 - 5   Mucus PRESENT     Comment: Performed at [facility], [address], [place], [place] [address]  SARS Coronavirus 2 (CEPHEID - Performed in [place] hospital lab), Hosp Order     Status: None   Collection Time: 01/10/19 10:51 PM   Specimen: Nasopharyngeal Swab  Result Value Ref Range   SARS Coronavirus 2 NEGATIVE NEGATIVE    Comment: (NOTE) If result is NEGATIVE SARS-CoV-2 target nucleic acids are NOT DETECTED. The SARS-CoV-2 RNA is generally detectable in upper and lower  respiratory specimens during the acute phase of infection. The lowest  concentration of SARS-CoV-2 viral copies this assay can detect is 250  copies / mL. A negative result does not preclude SARS-CoV-2 infection  and should not be used as the sole basis for treatment or other  patient management  decisions.  A negative result may occur with  improper specimen collection / handling, submission of specimen other  than nasopharyngeal swab, presence of viral mutation(s) within the  areas targeted by this  assay, and inadequate number of viral copies  (<250 copies / mL). A negative result must be combined with clinical  observations, patient history, and epidemiological information. If result is POSITIVE SARS-CoV-2 target nucleic acids are DETECTED. The SARS-CoV-2 RNA is generally detectable in upper and lower  respiratory specimens dur ing the acute phase of infection.  Positive  results are indicative of active infection with SARS-CoV-2.  Clinical  correlation with patient history and other diagnostic information is  necessary to determine patient infection status.  Positive results do  not rule out bacterial infection or co-infection with other viruses. If result is PRESUMPTIVE POSTIVE SARS-CoV-2 nucleic acids MAY BE PRESENT.   A presumptive positive result was obtained on the submitted specimen  and confirmed on repeat testing.  While 2019 novel coronavirus  (SARS-CoV-2) nucleic acids may be present in the submitted sample  additional confirmatory testing may be necessary for epidemiological  and / or clinical management purposes  to differentiate between  SARS-CoV-2 and other Sarbecovirus currently known to infect humans.  If clinically indicated additional testing with an alternate test  methodology [phone]) is advised. The SARS-CoV-2 RNA is generally  detectable in upper and lower respiratory sp ecimens during the acute  phase of infection. The expected result is Negative. Fact Sheet for Patients:  StrictlyIdeas.no Fact Sheet for Healthcare Providers: BankingDealers.co.za This test is not yet approved or cleared by the [place] FDA and has been authorized for detection and/or diagnosis of SARS-CoV-2 by FDA under an  Emergency Use Authorization (EUA).  This EUA will remain in effect (meaning this test can be used) for the duration of the COVID-19 declaration under Section 564(b)(1) of the Act, 21 U.S.C. section 360bbb-3(b)(1), unless the authorization is terminated or revoked sooner. Performed at [facility], [address] [place], [place] [address]    No results found.  Pending Labs [employer] (From admission, onward)    Start     Ordered   Signed and Held  CBC  (enoxaparin (LOVENOX)    CrCl >/= 30 ml/min)  Once,   R    Comments: Baseline for enoxaparin therapy IF NOT ALREADY DRAWN.  Notify MD if PLT < 100 K.    Signed and Held   Signed and Held  Creatinine, serum  (enoxaparin (LOVENOX)    CrCl >/= 30 ml/min)  Once,   R    Comments: Baseline for enoxaparin therapy IF NOT ALREADY DRAWN.    Signed and Held   Signed and Held  Creatinine, serum  (enoxaparin (LOVENOX)    CrCl >/= 30 ml/min)  Weekly,   R    Comments: while on enoxaparin therapy    Signed and Held   Signed and Held  Basic metabolic panel  Tomorrow morning,   R     Signed and Held   Signed and Held  CBC  Tomorrow morning,   R     Signed and Held          Vitals/Pain Today's Vitals   01/10/19 2030 01/10/19 2100 01/10/19 2130 01/10/19 2145  BP: 116/76 113/70 121/76   Pulse: 61 (!) 59 60   Resp: (!) 22 19 20    Temp:      TempSrc:      SpO2: 99% 100% 100%   Weight:      Height:      PainSc:    5     Isolation Precautions No active isolations  Medications Medications  HYDROmorphone (DILAUDID) injection 0.5 mg (0.5  mg Intravenous Refused 01/10/19 2014)  ondansetron (ZOFRAN) injection 4 mg (4 mg Intravenous Not Given 01/10/19 2015)  dextrose 5 % in lactated ringers infusion (has no administration in time range)  sodium chloride flush (NS) 0.9 % injection 3 mL (3 mLs Intravenous Given 01/10/19 2211)  ondansetron (ZOFRAN) injection 4 mg (4 mg Intravenous Given 01/10/19 1939)  0.9 %  sodium chloride  infusion ( Intravenous Stopped 01/11/19 0026)  promethazine (PHENERGAN) injection 25 mg (25 mg Intravenous Given 01/10/19 2141)    Mobility  Low fall risk   Focused Assessments GI N/V    R Recommendations: See Admitting Provider Note  Report given to:   Additional Notes

## 2019-01-12 ENCOUNTER — Ambulatory Visit
Admit: 2019-01-12 | Discharge: 2019-01-14 | Disposition: A | Discharge status: Home / Self Care | Payer: MEDICAID | Source: Other Acute Inpatient Hospital | Admitting: Surgery

## 2019-01-12 DIAGNOSIS — G43909 Migraine, unspecified, not intractable, without status migrainosus: Secondary | ICD-10-CM | POA: Diagnosis not present

## 2019-01-12 DIAGNOSIS — E785 Hyperlipidemia, unspecified: Secondary | ICD-10-CM | POA: Diagnosis not present

## 2019-01-12 DIAGNOSIS — F4310 Post-traumatic stress disorder, unspecified: Secondary | ICD-10-CM | POA: Diagnosis not present

## 2019-01-12 DIAGNOSIS — R002 Palpitations: Secondary | ICD-10-CM | POA: Diagnosis not present

## 2019-01-12 DIAGNOSIS — R748 Abnormal levels of other serum enzymes: Secondary | ICD-10-CM | POA: Diagnosis not present

## 2019-01-12 DIAGNOSIS — R112 Nausea with vomiting, unspecified: Secondary | ICD-10-CM | POA: Diagnosis not present

## 2019-01-12 DIAGNOSIS — M1990 Unspecified osteoarthritis, unspecified site: Secondary | ICD-10-CM | POA: Diagnosis not present

## 2019-01-12 DIAGNOSIS — G479 Sleep disorder, unspecified: Secondary | ICD-10-CM | POA: Diagnosis not present

## 2019-01-12 DIAGNOSIS — R42 Dizziness and giddiness: Secondary | ICD-10-CM | POA: Diagnosis not present

## 2019-01-12 DIAGNOSIS — R110 Nausea: Secondary | ICD-10-CM | POA: Diagnosis not present

## 2019-01-12 DIAGNOSIS — Z6841 Body Mass Index (BMI) 40.0 and over, adult: Secondary | ICD-10-CM | POA: Diagnosis not present

## 2019-01-12 DIAGNOSIS — Q631 Lobulated, fused and horseshoe kidney: Secondary | ICD-10-CM | POA: Diagnosis not present

## 2019-01-12 DIAGNOSIS — E039 Hypothyroidism, unspecified: Secondary | ICD-10-CM | POA: Diagnosis not present

## 2019-01-12 DIAGNOSIS — H9319 Tinnitus, unspecified ear: Secondary | ICD-10-CM | POA: Diagnosis not present

## 2019-01-12 DIAGNOSIS — R079 Chest pain, unspecified: Secondary | ICD-10-CM | POA: Diagnosis not present

## 2019-01-12 DIAGNOSIS — D509 Iron deficiency anemia, unspecified: Secondary | ICD-10-CM | POA: Diagnosis not present

## 2019-01-12 DIAGNOSIS — K9589 Other complications of other bariatric procedure: Secondary | ICD-10-CM | POA: Diagnosis not present

## 2019-01-12 DIAGNOSIS — I493 Ventricular premature depolarization: Secondary | ICD-10-CM | POA: Diagnosis not present

## 2019-01-12 DIAGNOSIS — M069 Rheumatoid arthritis, unspecified: Secondary | ICD-10-CM | POA: Diagnosis not present

## 2019-01-12 DIAGNOSIS — N3000 Acute cystitis without hematuria: Secondary | ICD-10-CM | POA: Diagnosis not present

## 2019-01-12 DIAGNOSIS — Z9884 Bariatric surgery status: Secondary | ICD-10-CM | POA: Diagnosis not present

## 2019-01-12 DIAGNOSIS — Z9114 Patient's other noncompliance with medication regimen: Secondary | ICD-10-CM | POA: Diagnosis not present

## 2019-01-12 DIAGNOSIS — E876 Hypokalemia: Secondary | ICD-10-CM | POA: Diagnosis not present

## 2019-01-12 DIAGNOSIS — E559 Vitamin D deficiency, unspecified: Secondary | ICD-10-CM | POA: Diagnosis not present

## 2019-01-12 DIAGNOSIS — R7303 Prediabetes: Secondary | ICD-10-CM | POA: Diagnosis not present

## 2019-01-12 DIAGNOSIS — R1031 Right lower quadrant pain: Secondary | ICD-10-CM | POA: Diagnosis not present

## 2019-01-12 DIAGNOSIS — F319 Bipolar disorder, unspecified: Secondary | ICD-10-CM | POA: Diagnosis not present

## 2019-01-12 DIAGNOSIS — I10 Essential (primary) hypertension: Secondary | ICD-10-CM | POA: Diagnosis not present

## 2019-01-12 DIAGNOSIS — Z1159 Encounter for screening for other viral diseases: Secondary | ICD-10-CM | POA: Diagnosis not present

## 2019-01-12 DIAGNOSIS — F419 Anxiety disorder, unspecified: Secondary | ICD-10-CM | POA: Diagnosis not present

## 2019-01-12 DIAGNOSIS — R1310 Dysphagia, unspecified: Secondary | ICD-10-CM | POA: Diagnosis not present

## 2019-01-12 LAB — COMPREHENSIVE METABOLIC PANEL
ALBUMIN: 3.5 g/dL (ref 3.5–5.0)
ALKALINE PHOSPHATASE: 75 U/L (ref 38–126)
ALT (SGPT): 41 U/L — ABNORMAL HIGH (ref <35)
AST (SGOT): 47 U/L — ABNORMAL HIGH (ref 14–38)
BILIRUBIN TOTAL: 1 mg/dL (ref 0.0–1.2)
BLOOD UREA NITROGEN: 4 mg/dL — ABNORMAL LOW (ref 7–21)
BUN / CREAT RATIO: 8
CALCIUM: 9.3 mg/dL (ref 8.5–10.2)
CHLORIDE: 102 mmol/L (ref 98–107)
CO2: 23 mmol/L (ref 22.0–30.0)
CREATININE: 0.51 mg/dL — ABNORMAL LOW (ref 0.60–1.00)
EGFR CKD-EPI AA FEMALE: >90 mL/min/{1.73_m2} (ref >=60)
EGFR CKD-EPI NON-AA FEMALE: >90 mL/min/{1.73_m2} (ref >=60)
GLUCOSE RANDOM: 104 mg/dL (ref 70–179)
POTASSIUM: 4.2 mmol/L (ref 3.5–5.0)
PROTEIN TOTAL: 5.9 g/dL — ABNORMAL LOW (ref 6.5–8.3)
SODIUM: 138 mmol/L (ref 135–145)

## 2019-01-12 LAB — CBC W/ AUTO DIFF
BASOPHILS ABSOLUTE COUNT: 0 10*9/L (ref 0.0–0.1)
BASOPHILS RELATIVE PERCENT: 0.3 %
EOSINOPHILS ABSOLUTE COUNT: 0 10*9/L (ref 0.0–0.4)
EOSINOPHILS RELATIVE PERCENT: 0.3 %
HEMATOCRIT: 47.2 % — ABNORMAL HIGH (ref 36.0–46.0)
HEMOGLOBIN: 15.3 g/dL (ref 13.5–16.0)
LARGE UNSTAINED CELLS: 1 % (ref 0–4)
LYMPHOCYTES ABSOLUTE COUNT: 0.7 10*9/L — ABNORMAL LOW (ref 1.5–5.0)
LYMPHOCYTES RELATIVE PERCENT: 6.9 %
MEAN CORPUSCULAR HEMOGLOBIN: 28.5 pg (ref 26.0–34.0)
MEAN PLATELET VOLUME: 7.4 fL (ref 7.0–10.0)
MONOCYTES RELATIVE PERCENT: 2.2 %
NEUTROPHILS ABSOLUTE COUNT: 8.4 10*9/L — ABNORMAL HIGH (ref 2.0–7.5)
PLATELET COUNT: 274 10*9/L (ref 150–440)
RED BLOOD CELL COUNT: 5.36 10*12/L — ABNORMAL HIGH (ref 4.00–5.20)
RED CELL DISTRIBUTION WIDTH: 16.8 % — ABNORMAL HIGH (ref 12.0–15.0)
WBC ADJUSTED: 9.3 10*9/L (ref 4.5–11.0)

## 2019-01-12 LAB — ALKALINE PHOSPHATASE: Alkaline phosphatase:CCnc:Pt:Ser/Plas:Qn:: 75

## 2019-01-12 LAB — MEAN PLATELET VOLUME: Lab: 7.4

## 2019-01-12 LAB — LIPASE: Triacylglycerol lipase:CCnc:Pt:Ser/Plas:Qn:: 520 — ABNORMAL HIGH

## 2019-01-12 LAB — BASIC METABOLIC PANEL
Anion gap: 11 (ref 5–15)
BUN: <5 mg/dL — ABNORMAL LOW (ref 6–20)
CO2: 25 mmol/L (ref 22–32)
Calcium: 8.3 mg/dL — ABNORMAL LOW (ref 8.9–10.3)
Chloride: 106 mmol/L (ref 98–111)
Creatinine, Ser: 0.53 mg/dL (ref 0.44–1.00)
GFR calc Af Amer: >60 mL/min (ref >60)
GFR calc non Af Amer: >60 mL/min (ref >60)
Glucose, Bld: 79 mg/dL (ref 70–99)
Potassium: 3.2 mmol/L — ABNORMAL LOW (ref 3.5–5.1)
Sodium: 142 mmol/L (ref 135–145)

## 2019-01-12 LAB — URINE CULTURE

## 2019-01-12 LAB — T4, FREE: Free T4: 1.15 ng/dL — ABNORMAL HIGH (ref 0.61–1.12)

## 2019-01-12 MED ORDER — SODIUM CHLORIDE 0.9 % IV SOLN: 1.0000 g | INTRAVENOUS | Status: DC

## 2019-01-12 MED ORDER — METHYLPREDNISOLONE SODIUM SUCC 40 MG IJ SOLR
40.0000 mg | Freq: Every day | INTRAMUSCULAR | Status: DC
  Administered 2019-01-12: 40 mg via INTRAVENOUS
  Filled 2019-01-12: qty 1

## 2019-01-12 MED ORDER — SODIUM CHLORIDE 0.9 % IV SOLN
INTRAVENOUS | Status: DC
  Administered 2019-01-12: 08:00:00 via INTRAVENOUS

## 2019-01-12 MED ORDER — METOCLOPRAMIDE HCL 5 MG/ML IJ SOLN: 10.0000 mg | Freq: Four times a day (QID) | INTRAMUSCULAR | 0 refills | Status: DC

## 2019-01-12 MED ORDER — DRONABINOL 2.5 MG PO CAPS: 2.5000 mg | ORAL_CAPSULE | Freq: Every day | ORAL | Status: DC

## 2019-01-12 MED ORDER — POTASSIUM CHLORIDE 10 MEQ/100ML IV SOLN
10.0000 meq | INTRAVENOUS | Status: AC
  Administered 2019-01-12 (×4): 10 meq via INTRAVENOUS
  Filled 2019-01-12 (×4): qty 100

## 2019-01-12 MED ORDER — CYANOCOBALAMIN 1000 MCG/ML IJ SOLN
1000.0000 ug | Freq: Once | INTRAMUSCULAR | Status: AC
  Administered 2019-01-12: 15:00:00 1000 ug via INTRAMUSCULAR
  Filled 2019-01-12: qty 1

## 2019-01-12 NOTE — Discharge Summary (Signed)
[facility] [place] NAME: Leah Osborne    MR#:  [id number]  DATE OF BIRTH:  [birth year]  DATE OF ADMISSION:  01/01/2019 ADMITTING PHYSICIAN: Saundra Shelling, MD  DATE OF DISCHARGE: 01/02/2019  5:06 PM  PRIMARY CARE PHYSICIAN: Olin Hauser, DO   ADMISSION DIAGNOSIS:  Dehydration [E86.0] Hypokalemia [E87.6]  DISCHARGE DIAGNOSIS:  Active Problems:   Hypokalemia   SECONDARY DIAGNOSIS:   Past Medical History:  Diagnosis Date  . Abnormal Pap smear of cervix   . Arthritis   . Arthritis, rheumatoid ([place])   . Bipolar 1 disorder ([place])   . Chest pain   . Headache    MIGRAINES  . Hyperlipidemia   . Hypertension   . Palpitations    a. 09/2017 Zio Monitor: predominantly RSR, avg HR 87, 11 A tach/SVT episodes, fastest 176 x 7 beats, longest 9 beats. Rare PAC's, occas PVC's, rare couplets/triplets.  Marland Kitchen PTSD (post-traumatic stress disorder)   . PVC (premature ventricular contraction)   . Social phobia      ADMITTING HISTORY  HISTORY OF PRESENT ILLNESS: Leah Osborne  is a 33 y.o. female with a known history of rheumatoid arthritis, bipolar disorder, hyperlipidemia, hypertension, history of gastric sleeve surgery on June 3 presented to the emergency room for nausea and vomiting.  Patient had gastric sleeve surgery on June 30 [facility] and after 2 weeks after surgery she has noticed more nausea and vomiting.  For the last couple of days she has been having a lot of vomiting unable to eat any food and drink any fluids.  She was evaluated in the emergency room appears dry and dehydrated with low potassium level.  No complains of any vomiting of blood, rectal bleed.  Hospitalist service was consulted for further care.  She was worked up with CT abdomen which showed nonobstructive nephrolithiasis no acute pathology.  HOSPITAL COURSE:   *Intractable nausea and vomiting Secondary to recent bariatric surgery. CT scan of the  abdomen and pelvis showed postsurgical changes.  Nothing acute. Started scheduled Reglan and patient has tolerated diet.  Still has nausea but no vomiting. Advised f/u with [facility] bariatric surgery within 1 week [facility] records reviewed  Acute hypokalemia has been replaced and resolved  Dehydration resolved with IV fluids.  Able to tolerate oral hydration.  Stable for discharge home.  CONSULTS OBTAINED:    DRUG ALLERGIES:  No Known Allergies  DISCHARGE MEDICATIONS:   Allergies as of 01/02/2019   No Known Allergies     Medication List    STOP taking these medications   celecoxib 200 MG capsule Commonly known as: CELEBREX   promethazine 12.5 MG tablet Commonly known as: PHENERGAN     TAKE these medications   Elagolix Sodium 150 MG Tabs Commonly known as: [place] Take 150 mg by mouth daily.   esomeprazole 40 MG capsule Commonly known as: NEXIUM Take 40 mg by mouth daily.   HUMIRA PEN [place] Inject into the skin.   levothyroxine 25 MCG tablet Commonly known as: SYNTHROID Take 1 tablet (25 mcg total) by mouth daily before breakfast.   magnesium oxide 400 MG tablet Commonly known as: MAG-OX Take 1 tablet (400 mg total) by mouth daily.   mirtazapine 15 MG tablet Commonly known as: REMERON Take 15 mg by mouth at bedtime.   propranolol ER 60 MG 24 hr capsule Commonly known as: INDERAL LA Take 1 capsule (60 mg total) by mouth daily.   spironolactone 50 MG tablet  Commonly known as: ALDACTONE TAKE 1 TABLET BY MOUTH TWICE A DAY   sucralfate 1 GM/10ML suspension Commonly known as: CARAFATE Take 1 g by mouth 4 (four) times daily.       Today   VITAL SIGNS:  Blood pressure 129/74, pulse 83, temperature 98 F (36.7 C), temperature source Oral, resp. rate 20, height 5\' 1"  (1.549 m), weight 121.6 kg, last menstrual period 12/03/2018, SpO2 100 %.  I/O:  No intake or output data in the 24 hours ending 01/12/19 1546  PHYSICAL EXAMINATION:  Physical Exam  GENERAL:   33 y.o.-year-old patient lying in the bed with no acute distress.  LUNGS: Normal breath sounds bilaterally, no wheezing, rales,rhonchi or crepitation. No use of accessory muscles of respiration.  CARDIOVASCULAR: S1, S2 normal. No murmurs, rubs, or gallops.  ABDOMEN: Soft, non-tender, non-distended. Bowel sounds present. No organomegaly or mass.  NEUROLOGIC: Moves all 4 extremities. PSYCHIATRIC: The patient is alert and oriented x 3.  SKIN: No obvious rash, lesion, or ulcer.   DATA REVIEW:   CBC Recent Labs  Lab 01/11/19 0416  WBC 10.2  HGB 14.4  HCT 43.6  PLT 307    Chemistries  Recent Labs  Lab 01/10/19 1935  01/11/19 1459 01/12/19 0409  NA 138   < >  --  142  K 3.2*   < >  --  3.2*  CL 97*   < >  --  106  CO2 23   < >  --  25  GLUCOSE 91   < >  --  79  BUN 9   < >  --  <5*  CREATININE 0.71   < >  --  0.53  CALCIUM 9.1   < >  --  8.3*  MG  --   --  1.9  --   AST 30  --   --   --   ALT 28  --   --   --   ALKPHOS 71  --   --   --   BILITOT 1.5*  --   --   --    < > = values in this interval not displayed.    Cardiac Enzymes No results for input(s): TROPONINI in the last 168 hours.  Microbiology Results  Results for orders placed or performed during the hospital encounter of 01/01/19  SARS Coronavirus 2 (CEPHEID - Performed in [place] hospital lab), Hosp Order     Status: None   Collection Time: 01/01/19  4:55 PM   Specimen: Nasopharyngeal Swab  Result Value Ref Range Status   SARS Coronavirus 2 NEGATIVE NEGATIVE Final    Comment: (NOTE) If result is NEGATIVE SARS-CoV-2 target nucleic acids are NOT DETECTED. The SARS-CoV-2 RNA is generally detectable in upper and lower  respiratory specimens during the acute phase of infection. The lowest  concentration of SARS-CoV-2 viral copies this assay can detect is 250  copies / mL. A negative result does not preclude SARS-CoV-2 infection  and should not be used as the sole basis for treatment or other  patient  management decisions.  A negative result may occur with  improper specimen collection / handling, submission of specimen other  than nasopharyngeal swab, presence of viral mutation(s) within the  areas targeted by this assay, and inadequate number of viral copies  (<250 copies / mL). A negative result must be combined with clinical  observations, patient history, and epidemiological information. If result is POSITIVE SARS-CoV-2 target nucleic acids are DETECTED.  The SARS-CoV-2 RNA is generally detectable in upper and lower  respiratory specimens dur ing the acute phase of infection.  Positive  results are indicative of active infection with SARS-CoV-2.  Clinical  correlation with patient history and other diagnostic information is  necessary to determine patient infection status.  Positive results do  not rule out bacterial infection or co-infection with other viruses. If result is PRESUMPTIVE POSTIVE SARS-CoV-2 nucleic acids MAY BE PRESENT.   A presumptive positive result was obtained on the submitted specimen  and confirmed on repeat testing.  While 2019 novel coronavirus  (SARS-CoV-2) nucleic acids may be present in the submitted sample  additional confirmatory testing may be necessary for epidemiological  and / or clinical management purposes  to differentiate between  SARS-CoV-2 and other Sarbecovirus currently known to infect humans.  If clinically indicated additional testing with an alternate test  methodology [phone]) is advised. The SARS-CoV-2 RNA is generally  detectable in upper and lower respiratory sp ecimens during the acute  phase of infection. The expected result is Negative. Fact Sheet for Patients:  StrictlyIdeas.no Fact Sheet for Healthcare Providers: BankingDealers.co.za This test is not yet approved or cleared by the [place] FDA and has been authorized for detection and/or diagnosis of SARS-CoV-2 by FDA under  an Emergency Use Authorization (EUA).  This EUA will remain in effect (meaning this test can be used) for the duration of the COVID-19 declaration under Section 564(b)(1) of the Act, 21 U.S.C. section 360bbb-3(b)(1), unless the authorization is terminated or revoked sooner. Performed at [facility], [address] [place], [place] [address]     RADIOLOGY:  No results found.  Follow up with PCP in 1 week.  Management plans discussed with the patient, family and they are in agreement.  CODE STATUS:  Code Status History    Date Active Date Inactive Code Status Order ID Comments User Context   01/01/2019 1642 01/02/2019 2012 Full Code [id number]  Saundra Shelling, MD ED   10/23/2016 1446 10/24/2016 1607 Full Code [id number]  Clovis Fredrickson, MD Inpatient   10/22/2016 1708 10/23/2016 1312 Full Code [id number]  Recardo Evangelist, PA-C ED   Advance Care Planning Activity      TOTAL TIME TAKING CARE OF THIS PATIENT ON DAY OF DISCHARGE: more than 30 minutes.   Leah Osborne M.D on 01/12/2019 at 3:46 PM  Between 7am to 6pm - Pager - [phone]  After 6pm go to [email or web address] - password [email or web address] [place]  Office  [phone]  CC: Primary care physician; Olin Hauser, DO  Note: This dictation was prepared with Dragon dictation along with smaller phrase technology. Any transcriptional errors that result from this process are unintentional.

## 2019-01-12 NOTE — Progress Notes (Signed)
RN called report to [facility]. Report given to [facility] and pt will be going to [facility]. Pt will be transported by [facility] services. RN will leave IV in place.

## 2019-01-12 NOTE — Discharge Summary (Signed)
[place]: Leah Osborne    MR#:  [id number]  DATE OF BIRTH:  1985/09/20  DATE OF ADMISSION:  01/10/2019   ADMITTING PHYSICIAN: Lance Coon, MD  DATE OF DISCHARGE: 01/12/19  PRIMARY CARE PHYSICIAN: Olin Hauser, DO   ADMISSION DIAGNOSIS:   Intractable vomiting with nausea, unspecified vomiting type [R11.2]  DISCHARGE DIAGNOSIS:   Principal Problem:   Intractable nausea and vomiting Active Problems:   Hypothyroidism   Hypertension   Dyslipidemia   Hypokalemia   SECONDARY DIAGNOSIS:   Past Medical History:  Diagnosis Date  . Abnormal Pap smear of cervix   . Arthritis   . Arthritis, rheumatoid ([place])   . Bipolar 1 disorder ([place])   . Chest pain   . Headache    MIGRAINES  . Hyperlipidemia   . Hypertension   . Palpitations    a. 09/2017 Zio Monitor: predominantly RSR, avg HR 87, 11 A tach/SVT episodes, fastest 176 x 7 beats, longest 9 beats. Rare PAC's, occas PVC's, rare couplets/triplets.  Marland Kitchen PTSD (post-traumatic stress disorder)   . PVC (premature ventricular contraction)   . Social phobia     HOSPITAL COURSE:   33 year old female with past medical history significant for obesity status post sleeve gastrectomy done at [facility] on 11/19/2018, prediabetes, bipolar, hypertension and palpitations presents to hospital secondary to dizziness, nausea and vomiting.  1.  Intractable nausea and vomiting-patient had sleeve gastrectomy procedure done at [facility] on 11/19/2018, symptoms of nausea and vomiting started 2 weeks after her procedure.   -About 4 weeks ago, she has had a post procedure upper GI endoscopy followed by dilatation and stricture according to her.  Her nausea vomiting symptoms have never improved.  Worsening over the last couple of weeks with 2 previous hospital admissions for the same symptoms. -Strongly educated patient that she needs to follow-up with bariatric surgery/GI surgeon at [facility] to see  if she needs a repeat procedure.  Patient has called her physician who has recommended that she can be transferred to [facility] under GI service for further management.   -He has taken multiple antiemetics at home and while in the hospital to help symptom control with moderate outcome.  -Continue IV fluids  2.  Dizziness- with nausea vomiting.  Likely electrolyte abnormalities.  Continue to monitor closely -Potassium being replaced.  Also check B12 levels, TSH and vitamin D levels.  Giving dose of steroid with right-sided vestibular symptoms. -Hold Remeron  3.  History of palpitations-multiple PVCs on outpatient monitor.  Followed with [facility] cardiology up until recently.  Continue to monitor and correct electrolytes. -Takes propranolol if able to tolerate oral medications  4.  Hypothyroidism-continue Synthroid  5.  Acute cystitis-urinary cultures ordered.  On Rocephin-today is day 2  6.  DVT prophylaxis-Lovenox-while inpatient  Possible transfer to [facility] GI service today   DISCHARGE CONDITIONS:   Guarded  CONSULTS OBTAINED:   None  DRUG ALLERGIES:   No Known Allergies DISCHARGE MEDICATIONS:   Allergies as of 01/12/2019   No Known Allergies     Medication List    STOP taking these medications   Elagolix Sodium 150 MG Tabs Commonly known as: Orilissa   HUMIRA PEN [place]   magnesium oxide 400 MG tablet Commonly known as: MAG-OX   meclizine 25 MG tablet Commonly known as: ANTIVERT   mirtazapine 15 MG tablet Commonly known as: REMERON   potassium chloride 20 MEQ/15ML (10%) Soln   promethazine 25 MG suppository  Commonly known as: PHENERGAN   spironolactone 50 MG tablet Commonly known as: ALDACTONE     TAKE these medications   cefTRIAXone 1 g in sodium chloride 0.9 % 100 mL Inject 1 g into the vein daily. Start taking on: January 13, 2019   esomeprazole 40 MG capsule Commonly known as: NEXIUM Take 40 mg by mouth daily.   levothyroxine 25 MCG tablet Commonly  known as: SYNTHROID Take 1 tablet (25 mcg total) by mouth daily before breakfast.   metoCLOPramide 5 MG/ML injection Commonly known as: REGLAN Inject 2 mLs (10 mg total) into the vein every 6 (six) hours.   propranolol ER 60 MG 24 hr capsule Commonly known as: INDERAL LA Take 1 capsule (60 mg total) by mouth daily.   scopolamine 1 MG/3DAYS Commonly known as: TRANSDERM-SCOP Place 1 patch (1.5 mg total) onto the skin every 3 (three) days. For motion sickness / dizziness   sucralfate 1 GM/10ML suspension Commonly known as: CARAFATE Take 1 g by mouth 4 (four) times daily.        DISCHARGE INSTRUCTIONS:   1. PCP f/u in 2 weeks 2. [facility] GI surgery follow up as recommended  DIET:   Full liquid diet  ACTIVITY:   Activity as tolerated  OXYGEN:   Home Oxygen: No.  Oxygen Delivery: room air  DISCHARGE LOCATION:   [facility]   If you experience worsening of your admission symptoms, develop shortness of breath, life threatening emergency, suicidal or homicidal thoughts you must seek medical attention immediately by calling 911 or calling your MD immediately  if symptoms less severe.  You Must read complete instructions/literature along with all the possible adverse reactions/side effects for all the Medicines you take and that have been prescribed to you. Take any new Medicines after you have completely understood and accpet all the possible adverse reactions/side effects.   Please note  You were cared for by a hospitalist during your hospital stay. If you have any questions about your discharge medications or the care you received while you were in the hospital after you are discharged, you can call the unit and asked to speak with the hospitalist on call if the hospitalist that took care of you is not available. Once you are discharged, your primary care physician will handle any further medical issues. Please note that NO REFILLS for any discharge medications will be authorized  once you are discharged, as it is imperative that you return to your primary care physician (or establish a relationship with a primary care physician if you do not have one) for your aftercare needs so that they can reassess your need for medications and monitor your lab values.    On the day of Discharge:  VITAL SIGNS:   Blood pressure (!) 140/94, pulse 82, temperature 98 F (36.7 C), temperature source Oral, resp. rate 19, height 5\' 1"  (1.549 m), weight 117 kg, last menstrual period 11/22/2018, SpO2 97 %.  PHYSICAL EXAMINATION:   GENERAL:  33 y.o.-year-old obese patient lying in the bed with no acute distress.  EYES: Pupils equal, round, reactive to light and accommodation. No scleral icterus. Extraocular muscles intact.  HEENT: Head atraumatic, normocephalic. Oropharynx and nasopharynx clear.  Clear auditory canals NECK:  Supple, no jugular venous distention. No thyroid enlargement, no tenderness.  LUNGS: Normal breath sounds bilaterally, no wheezing, rales,rhonchi or crepitation. No use of accessory muscles of respiration.  CARDIOVASCULAR: S1, S2 normal. No murmurs, rubs, or gallops.  ABDOMEN: Soft, nontender, nondistended. Bowel sounds present.  No organomegaly or mass.  EXTREMITIES: No pedal edema, cyanosis, or clubbing.  NEUROLOGIC: Cranial nerves II through XII are intact. Muscle strength 5/5 in all extremities. Sensation intact. Gait not checked.  PSYCHIATRIC: The patient is alert and oriented x 3.  SKIN: No obvious rash, lesion, or ulcer.    DATA REVIEW:   CBC Recent Labs  Lab 01/11/19 0416  WBC 10.2  HGB 14.4  HCT 43.6  PLT 307    Chemistries  Recent Labs  Lab 01/10/19 1935  01/11/19 1459 01/12/19 0409  NA 138   < >  --  142  K 3.2*   < >  --  3.2*  CL 97*   < >  --  106  CO2 23   < >  --  25  GLUCOSE 91   < >  --  79  BUN 9   < >  --  <5*  CREATININE 0.71   < >  --  0.53  CALCIUM 9.1   < >  --  8.3*  MG  --   --  1.9  --   AST 30  --   --   --   ALT 28   --   --   --   ALKPHOS 71  --   --   --   BILITOT 1.5*  --   --   --    < > = values in this interval not displayed.     Microbiology Results  Results for orders placed or performed during the hospital encounter of 01/10/19  Urine Culture     Status: Abnormal   Collection Time: 01/10/19  7:59 AM   Specimen: Urine, Random  Result Value Ref Range Status   Specimen Description   Final    URINE, RANDOM Performed at [facility], [address], [place], [place] [address]    Special Requests   Final    NONE Performed at [facility], [address] [place], [place] [address]    Culture MULTIPLE SPECIES PRESENT, SUGGEST RECOLLECTION (A)  Final   Report Status 01/12/2019 FINAL  Final  SARS Coronavirus 2 (CEPHEID - Performed in [place] hospital lab), Hosp Order     Status: None   Collection Time: 01/10/19 10:51 PM   Specimen: Nasopharyngeal Swab  Result Value Ref Range Status   SARS Coronavirus 2 NEGATIVE NEGATIVE Final    Comment: (NOTE) If result is NEGATIVE SARS-CoV-2 target nucleic acids are NOT DETECTED. The SARS-CoV-2 RNA is generally detectable in upper and lower  respiratory specimens during the acute phase of infection. The lowest  concentration of SARS-CoV-2 viral copies this assay can detect is 250  copies / mL. A negative result does not preclude SARS-CoV-2 infection  and should not be used as the sole basis for treatment or other  patient management decisions.  A negative result may occur with  improper specimen collection / handling, submission of specimen other  than nasopharyngeal swab, presence of viral mutation(s) within the  areas targeted by this assay, and inadequate number of viral copies  (<250 copies / mL). A negative result must be combined with clinical  observations, patient history, and epidemiological information. If result is POSITIVE SARS-CoV-2 target nucleic acids are DETECTED. The SARS-CoV-2 RNA is generally detectable in  upper and lower  respiratory specimens dur ing the acute phase of infection.  Positive  results are indicative of active infection with SARS-CoV-2.  Clinical  correlation with patient history  and other diagnostic information is  necessary to determine patient infection status.  Positive results do  not rule out bacterial infection or co-infection with other viruses. If result is PRESUMPTIVE POSTIVE SARS-CoV-2 nucleic acids MAY BE PRESENT.   A presumptive positive result was obtained on the submitted specimen  and confirmed on repeat testing.  While 2019 novel coronavirus  (SARS-CoV-2) nucleic acids may be present in the submitted sample  additional confirmatory testing may be necessary for epidemiological  and / or clinical management purposes  to differentiate between  SARS-CoV-2 and other Sarbecovirus currently known to infect humans.  If clinically indicated additional testing with an alternate test  methodology [id number]) is advised. The SARS-CoV-2 RNA is generally  detectable in upper and lower respiratory sp ecimens during the acute  phase of infection. The expected result is Negative. Fact Sheet for Patients:  StrictlyIdeas.no Fact Sheet for Healthcare Providers: BankingDealers.co.za This test is not yet approved or cleared by the [place] FDA and has been authorized for detection and/or diagnosis of SARS-CoV-2 by FDA under an Emergency Use Authorization (EUA).  This EUA will remain in effect (meaning this test can be used) for the duration of the COVID-19 declaration under Section 564(b)(1) of the Act, 21 U.S.C. section 360bbb-3(b)(1), unless the authorization is terminated or revoked sooner. Performed at [facility], [address] [place], [place] [address]     RADIOLOGY:  No results found.   Management plans discussed with the patient, family and they are in agreement.  CODE STATUS:     Code Status  Orders  (From admission, onward)         Start     Ordered   01/11/19 0110  Full code  Continuous     01/11/19 0109        Code Status History    Date Active Date Inactive Code Status Order ID Comments User Context   01/01/2019 1642 01/02/2019 2012 Full Code [id number]  Saundra Shelling, MD ED   10/23/2016 1446 10/24/2016 1607 Full Code [id number]  Clovis Fredrickson, MD Inpatient   10/22/2016 1708 10/23/2016 1312 Full Code [id number]  Recardo Evangelist, PA-C ED   Advance Care Planning Activity      TOTAL TIME TAKING CARE OF THIS PATIENT: 38 minutes.    Gladstone Lighter M.D on 01/12/2019 at 12:24 PM  Between 7am to 6pm - Pager - [phone]  After 6pm go to [email or web address] - Proofreader  [facility]  Office  [phone]  CC: Primary care physician; Olin Hauser, DO   Note: This dictation was prepared with Dragon dictation along with smaller phrase technology. Any transcriptional errors that result from this process are unintentional.

## 2019-01-12 NOTE — Progress Notes (Signed)
RN notified MD pt is complaining her potassium runs are burning and pt doesn't have IVF ordered. Per MD okay for RN to order NS to dilute potassium. RN will DC IVF once Potassium runs are finish. Will continue to assess and monitor pt.

## 2019-01-12 NOTE — Progress Notes (Signed)
[place] at [place] NAME: Leah Osborne    MR#:  [id number]  DATE OF BIRTH:  [birth year]  SUBJECTIVE:  CHIEF COMPLAINT:   Chief Complaint  Patient presents with  . Dizziness  . Chest Pain  . Emesis  . Nausea   -Still complains of dizziness and right ear fullness and ringing sensation.  Also has nausea and vomiting  REVIEW OF SYSTEMS:  Review of Systems  Constitutional: Negative for chills, fever and malaise/fatigue.  HENT: Negative for congestion, ear discharge, hearing loss and nosebleeds.   Eyes: Negative for blurred vision and double vision.  Respiratory: Negative for cough, shortness of breath and wheezing.   Cardiovascular: Negative for chest pain and palpitations.  Gastrointestinal: Positive for nausea and vomiting. Negative for abdominal pain, constipation and diarrhea.  Genitourinary: Negative for dysuria.  Musculoskeletal: Negative for myalgias.  Neurological: Positive for dizziness. Negative for focal weakness, seizures, weakness and headaches.  Psychiatric/Behavioral: Negative for depression.    DRUG ALLERGIES:  No Known Allergies  VITALS:  Blood pressure (!) 140/94, pulse 82, temperature 98 F (36.7 C), temperature source Oral, resp. rate 19, height 5\' 1"  (1.549 m), weight 117 kg, last menstrual period 11/22/2018, SpO2 97 %.  PHYSICAL EXAMINATION:  Physical Exam   GENERAL:  33 y.o.-year-old obese patient lying in the bed with no acute distress.  EYES: Pupils equal, round, reactive to light and accommodation. No scleral icterus. Extraocular muscles intact.  HEENT: Head atraumatic, normocephalic. Oropharynx and nasopharynx clear.  Clear auditory canals NECK:  Supple, no jugular venous distention. No thyroid enlargement, no tenderness.  LUNGS: Normal breath sounds bilaterally, no wheezing, rales,rhonchi or crepitation. No use of accessory muscles of respiration.  CARDIOVASCULAR: S1, S2 normal. No murmurs, rubs,  or gallops.  ABDOMEN: Soft, nontender, nondistended. Bowel sounds present. No organomegaly or mass.  EXTREMITIES: No pedal edema, cyanosis, or clubbing.  NEUROLOGIC: Cranial nerves II through XII are intact. Muscle strength 5/5 in all extremities. Sensation intact. Gait not checked.  PSYCHIATRIC: The patient is alert and oriented x 3.  SKIN: No obvious rash, lesion, or ulcer.    LABORATORY PANEL:   CBC Recent Labs  Lab 01/11/19 0416  WBC 10.2  HGB 14.4  HCT 43.6  PLT 307   ------------------------------------------------------------------------------------------------------------------  Chemistries  Recent Labs  Lab 01/10/19 1935  01/11/19 1459 01/12/19 0409  NA 138   < >  --  142  K 3.2*   < >  --  3.2*  CL 97*   < >  --  106  CO2 23   < >  --  25  GLUCOSE 91   < >  --  79  BUN 9   < >  --  <5*  CREATININE 0.71   < >  --  0.53  CALCIUM 9.1   < >  --  8.3*  MG  --   --  1.9  --   AST 30  --   --   --   ALT 28  --   --   --   ALKPHOS 71  --   --   --   BILITOT 1.5*  --   --   --    < > = values in this interval not displayed.   ------------------------------------------------------------------------------------------------------------------  Cardiac Enzymes No results for input(s): TROPONINI in the last 168 hours. ------------------------------------------------------------------------------------------------------------------  RADIOLOGY:  No results found.  EKG:   Orders placed or performed during the hospital  encounter of 01/10/19  . ED EKG  . ED EKG  . EKG 12-Lead  . EKG 12-Lead    ASSESSMENT AND PLAN:   33 year old female with past medical history significant for obesity status post sleeve gastrectomy done at [facility] on 11/19/2018, prediabetes, bipolar, hypertension and palpitations presents to hospital secondary to dizziness, nausea and vomiting.  1.  Intractable nausea and vomiting-symptoms started 2 weeks after her sleeve gastrectomy procedure about 6  weeks ago.  On and off nausea vomiting. -About 4 weeks ago she has had postprocedural upper GI endoscopy followed by dilatation of stricture.  Still has nausea and vomiting.  Strongly recommended to follow-up with bariatric surgeon and GI at [facility].  Has had few hospitalizations with similar symptoms, however explained that they might have to do a repeat endoscopy to look into it to rule out any complications. -Patient receiving IV fluids here.  Continue antiemetics and once able to tolerate solid diet will discharge with outpatient follow-up with her [facility]  2.  Dizziness-started with nausea vomiting.  Likely electrolyte abnormalities.  Continue to monitor closely -Potassium being replaced.  Also check B12 levels, TSH and vitamin D levels.  Giving dose of steroid with right-sided vestibular symptoms.  3.  History of palpitations-multiple PVCs on outpatient monitor.  Followed with [facility] cardiology up until recently.  Continue to monitor and correct electrolytes.  4.  Hypothyroidism-continue Synthroid  5.  Acute cystitis-urinary cultures ordered.  On Rocephin-change to oral antibiotics tomorrow discharge  6.  DVT prophylaxis-Lovenox     All the records are reviewed and case discussed with Care Management/Social Workerr. Management plans discussed with the patient, family and they are in agreement.  CODE STATUS: Full Code  TOTAL TIME TAKING CARE OF THIS PATIENT: 38 minutes.   POSSIBLE D/C tomorrow, DEPENDING ON CLINICAL CONDITION.   Gladstone Lighter M.D on 01/12/2019 at 12:15 PM  Between 7am to 6pm - Pager - [phone]  After 6pm go to [email or web address] - password [email or web address] [place] [facility]  Office  [phone]  CC: Primary care physician; Olin Hauser, DO

## 2019-01-12 NOTE — Progress Notes (Signed)
[place]  A and O x 4. VSS. Pt tolerating diet well. No complaints of pain or nausea. IV left in place. Pt will be going to [facility]. Pt will be transported by [facility] services,   Allergies as of 01/12/2019   No Known Allergies     Medication List    STOP taking these medications   Elagolix Sodium 150 MG Tabs Commonly known as: Orilissa   HUMIRA PEN [place]   magnesium oxide 400 MG tablet Commonly known as: MAG-OX   meclizine 25 MG tablet Commonly known as: ANTIVERT   mirtazapine 15 MG tablet Commonly known as: REMERON   potassium chloride 20 MEQ/15ML (10%) Soln   promethazine 25 MG suppository Commonly known as: PHENERGAN   spironolactone 50 MG tablet Commonly known as: ALDACTONE     TAKE these medications   cefTRIAXone 1 g in sodium chloride 0.9 % 100 mL Inject 1 g into the vein daily. Start taking on: January 13, 2019 Notes to patient: 01/13/19   esomeprazole 40 MG capsule Commonly known as: NEXIUM Take 40 mg by mouth daily.   levothyroxine 25 MCG tablet Commonly known as: SYNTHROID Take 1 tablet (25 mcg total) by mouth daily before breakfast.   metoCLOPramide 5 MG/ML injection Commonly known as: REGLAN Inject 2 mLs (10 mg total) into the vein every 6 (six) hours. Notes to patient: 01/12/19   propranolol ER 60 MG 24 hr capsule Commonly known as: INDERAL LA Take 1 capsule (60 mg total) by mouth daily.   scopolamine 1 MG/3DAYS Commonly known as: TRANSDERM-SCOP Place 1 patch (1.5 mg total) onto the skin every 3 (three) days. For motion sickness / dizziness   sucralfate 1 GM/10ML suspension Commonly known as: CARAFATE Take 1 g by mouth 4 (four) times daily.       Vitals:   01/12/19 1153 01/12/19 1420  BP: (!) 140/94 128/84  Pulse: 82 83  Resp: 19 16  Temp: 98 F (36.7 C) 98.6 F (37 C)  SpO2: 97% 99%    Leah Osborne

## 2019-01-13 ENCOUNTER — Ambulatory Visit: Payer: BC Managed Care – PPO | Admitting: Physician Assistant

## 2019-01-13 LAB — URINALYSIS
BILIRUBIN UA: NEGATIVE
BLOOD UA: NEGATIVE
GLUCOSE UA: NEGATIVE
KETONES UA: 40 — AB
LEUKOCYTE ESTERASE UA: NEGATIVE
PH UA: 7 (ref 5.0–9.0)
PROTEIN UA: NEGATIVE
RBC UA: 0 /HPF (ref <4)
SPECIFIC GRAVITY UA: 1.01 (ref 1.005–1.040)
SQUAMOUS EPITHELIAL: 1 /HPF (ref 0–5)
UROBILINOGEN UA: 0.2
WBC UA: 4 /HPF (ref 0–5)

## 2019-01-13 LAB — PROTEIN UA: Protein:MCnc:Pt:Urine:Qn:Test strip: NEGATIVE

## 2019-01-13 LAB — VITAMIN D 25 HYDROXY (VIT D DEFICIENCY, FRACTURES): Vit D, 25-Hydroxy: 22.9 ng/mL — ABNORMAL LOW (ref 30.0–100.0)

## 2019-01-13 NOTE — Unmapped (Signed)
AAOx4. Room air, no shortness of breath noted. PIV to BUE, with IVF infusing without complications. Minimal abdominal pain noted. Patient remains NPO. Skin intact. Bed low, call light in reach, side rails up x2. Will continue to monitor.       Problem: Adult Inpatient Plan of Care  Goal: Plan of Care Review  Outcome: Progressing  Goal: Patient-Specific Goal (Individualization)  Outcome: Progressing  Goal: Absence of Hospital-Acquired Illness or Injury  Outcome: Progressing  Goal: Optimal Comfort and Wellbeing  Outcome: Progressing  Goal: Readiness for Transition of Care  Outcome: Progressing  Goal: Rounds/Family Conference  Outcome: Progressing     Problem: Wound  Goal: Optimal Wound Healing  Outcome: Progressing

## 2019-01-13 NOTE — Unmapped (Signed)
No acute events this shift, VSS, denies pain, tolerating full liquid diet okay eating 100% of standard full diet tray without c/o nausea this shift, NPO status maintained after midnight, denies any needs, resting comfortable in bed, will continue to monitor closely,    Problem: Adult Inpatient Plan of Care  Goal: Plan of Care Review  Outcome: Progressing  Goal: Patient-Specific Goal (Individualization)  Outcome: Progressing  Goal: Absence of Hospital-Acquired Illness or Injury  Outcome: Progressing  Goal: Optimal Comfort and Wellbeing  Outcome: Progressing  Goal: Readiness for Transition of Care  Outcome: Progressing  Goal: Rounds/Family Conference  Outcome: Progressing     Problem: Wound  Goal: Optimal Wound Healing  Outcome: Progressing     Problem: Wound  Goal: Optimal Wound Healing  Outcome: Progressing

## 2019-01-13 NOTE — Unmapped (Signed)
Surgery History and Physical Note      Attending Physician:  Colon Branch, MD  Inpatient Service:  General Surgery          History of Present Illness:     Chief Complaint:  Nausea    Jacqueline Hall is a 33 y.o. female who complains of persistent nausea for ~6weeks. She states that her symptoms started 1.5weeks after she underwent robotic sleeve gastrectomy during an episode of constipation that she had. She reports that she vomits after oral intake and is having a lot of trouble keeping anything down. The nausea never goes away. She had some apple juice yesterday and she was able to keep that down.    She was last seen in clinic on 12/23/18 and the following plan for her nausea was implemented:    mirtazapine 15 mg nightly for 1 week increasing to 30 mg nightly.  Schedule Zofran 4 mg oral dissolvable tablet every 8 hours.  Begin promethazine as needed for episodes of nausea not controlled by scheduled mirtazapine and Zofran.  Use Zyprexa 5 mg nightly if she continues to have nausea breaking through the above regimen.     The above plan failed to improve her nausea.    She had a CT on 7/16 at [facility] with no acute findings in the abdomen or pelvis.    She reports she is passing gas and having regular bowel movements. Denies fevers, chills, chest pain, shortness of breath, abdominal pain, constipation, dysuria.    Of note, she reports having pregnancy associated nausea that lasted all day with her son (first pregnancy, but not her daughter, second pregnacy), 4 months at the beginning of pregnancy, and two months at the end, however, Zofran helped, and she was able to continue working.    Additionally, she notes that she has been having a lot of dizziness for the last 2 weeks, which worsened Tuesday evening. She also endorses echoing in her ears, tinnitus, and double vision. She reports that her symptoms are improved with lying perfectly still with her eyes closed.    Allergies    Patient has no known allergies.      Medications      Medications Prior to Admission   Medication Sig Dispense Refill Last Dose   ??? cyanocobalamin, vitamin B-12, (NASCOBAL) 500 mcg/spray Spry 1 spray into 1 nare once a week 4 Bottle 11 01/12/2019 at Unknown time   ??? elagolix (ORILISSA) 150 mg Tab Take 150 mg by mouth daily.   Past Month at Unknown time   ??? esomeprazole (NEXIUM) 40 MG capsule Take 1 capsule (40 mg total) by mouth daily. Please open capsule and mix in drink. 30 capsule 5 Past Month at Unknown time   ??? HUMIRA PEN CITRATE FREE 40 MG/0.4 ML INJECT 40 MG (0.4 ML) UNDER THE SKIN EVERY 14 DAYS 6 each 3 Past Month at Unknown time   ??? lamoTRIgine (LAMICTAL) 200 MG tablet Take 200 mg by mouth nightly.    Past Week at Unknown time   ??? levothyroxine (SYNTHROID, LEVOTHROID) 25 MCG tablet Take 1.5 tablets (37.5 mcg total) by mouth daily. (Patient taking differently: Take 25 mcg by mouth daily. ) 90 tablet 3 01/12/2019 at Unknown time   ??? MAGOX 400 mg (241.3 mg magnesium) tablet    Past Month at Unknown time   ??? mirtazapine (REMERON) 15 MG tablet Take 1 tablet by mouth nightly for 1 week.  Then increase to 2 tablets nightly thereafter. 60 tablet  1 01/11/2019 at Unknown time   ??? OLANZapine (ZYPREXA) 5 MG tablet Take 1 tablet (5 mg total) by mouth nightly. Take if zofran and promethazine is not improving nausea/vomiting. 30 tablet 0 Past Week at Unknown time   ??? ondansetron (ZOFRAN-ODT) 4 MG disintegrating tablet Take 1 tablet (4 mg total) by mouth every eight (8) hours as needed for nausea. 90 tablet 0 01/12/2019 at Unknown time   ??? promethazine (PHENERGAN) 12.5 MG tablet Take 1 tablet (12.5 mg total) by mouth every eight (8) hours. 90 tablet 0 Past Week at Unknown time   ??? spironolactone (ALDACTONE) 50 MG tablet Take 50 mg by mouth Two (2) times a day.   Past Month at Unknown time   ??? sucralfate (CARAFATE) 100 mg/mL suspension Take 10 mL (1 g total) by mouth Four (4) times a day. 420 mL 1 Past Week at Unknown time   ??? MEDICAL SUPPLY ITEM Please provide patient with 2 left carpal tunnel syndrome braces for wrists. 2 Units 0    ??? propranoloL (INDERAL) 20 MG tablet Take 1.5 tablets (30 mg total) by mouth Two (2) times a day. 90 tablet 0    ??? topiramate (TOPAMAX) 25 MG capsule Take 2 capsules (50 mg total) by mouth daily. 60 capsule 0          Past Medical History    Past Medical History:   Diagnosis Date   ??? Abnormal Pap smear of cervix 2013    per patient; biopsy was done and then everything was fine   ??? Anemia 2013   ??? Ankylosing spondylitis (CMS-HCC)    ??? Arthritis 2018   ??? Bipolar 1 disorder (CMS-HCC) 2018   ??? Depression    ??? Disease of thyroid gland    ??? Elevated blood-pressure reading without diagnosis of hypertension    ??? Headache(784.0)     more than 2-3 times a week; takes Ibuprofen. could not afford prescription medication   ??? History of pre-eclampsia in prior pregnancy, currently pregnant 11/2011   ??? 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 Hypertension    ??? Menorrhagia    ??? Morbid obesity with BMI of 50.0-59.9, adult (CMS-HCC)    ??? Obesity    ??? PTSD (post-traumatic stress disorder) 2018   ??? Sleep apnea          Past Surgical History    Past Surgical History:   Procedure Laterality Date   ??? CHOLECYSTECTOMY  12/20/2007    Initially done L/S then had some sort of complication, required endoscopy   ??? PR LAP, GAST RESTRICT PROC, LONGITUDINAL GASTRECTOMY Midline 11/19/2018    Procedure: R25 ROBOTIC PR LAP, GAST RESTRICT PROC, LONGITUDINAL GASTRECTOMY;  Surgeon: Colon Branch, MD;  Location: [facility];  Service: General Surgery   ??? PR UPPER GI ENDOSCOPY,DIAGNOSIS N/A 07/17/2016    Procedure: UGI ENDO, INCLUDE ESOPHAGUS, STOMACH, & DUODENUM &/OR JEJUNUM; DX W/WO COLLECTION SPECIMN, BY BRUSH OR WASH;  Surgeon: Annie Paras, MD;  [facility]: [facility];  Service: Gastroenterology   ??? PR UPPER GI ENDOSCOPY,W/DILAT,GASTRIC OUT N/A 12/16/2018    Procedure: UGI ENDO; W/DILATION GASTRIC OUTLET FOR OBSTRUCTION;  Surgeon: Chriss Driver, MD;  [facility]: [facility];  Service: Gastroenterology   ??? TUBAL LIGATION           Family History    The patient's family history includes Atrial fibrillation in her paternal grandmother; Dementia in her maternal grandmother; Diabetes in her maternal grandmother; Heart attack  in her maternal grandmother; Heart disease in her maternal grandfather and maternal grandmother; Heart failure in her paternal grandmother; Hypertension in her brother, father, maternal grandmother, and sister; Hypothyroidism in her maternal aunt; Migraines in her maternal aunt; Multiple sclerosis in her maternal uncle; No Known Problems in her mother; Osteoporosis in her maternal grandmother; Stroke in her maternal grandmother..      Social History:    Social History     Tobacco Use   ??? Smoking status: Never Smoker   ??? Smokeless tobacco: Never Used   Substance Use Topics   ??? Alcohol use: Never   ??? Drug use: Never         Review of Systems    A 12 system review of systems was negative except as noted in HPI        Vital Signs    Patient Vitals for the past 8 hrs:   BP Temp Temp src Pulse Resp SpO2 Height Weight   01/12/19 1800 148/81 ??? ??? 94 20 93 % ??? ???   01/12/19 1614 ??? ??? ??? ??? 20 ??? ??? ???   01/12/19 1611 133/88 35.8 ??C Oral 107 ??? 92 % ??? ???   01/12/19 1610 ??? ??? ??? ??? ??? ??? 154.9 cm (5' 1) (!) 117 kg (258 lb)     No intake/output data recorded.      Physical Exam    General Appearance:  No acute distress, well appearing and well nourished, cheeks are flushed   Head:  Normocephalic, atraumatic.   Eyes:  Conjuctiva and lids appear normal. Pupils equal and round,   sclera anicteric.   Ears:  Overall appearance normal with no scars, lesions or                masses.  Hearing is grossly normal.   Nose: Nares grossly normal, no drainage.   Throat: Grossly normal dentition.   Neck: Supple, symmetrical, trachea midline.   Pulmonary:    Normal respiratory effort.  Equal chest rise  bilaterally.   Cardiovascular:  Regular rate and rhythm, HDS.   Abdomen:   Soft, nontender, nondistended, no rebound or guarding   Musculoskeletal: Moves all extremities.  Extremities without clubbing, cyanosis, or edema.   Skin: Skin color, texture, turgor normal, no rashes or lesions.   Neurologic: No motor abnormalities noted.  Sensation grossly intact.   Psychiatric: Judgement and insight appropriate.  Oriented to person,          place,  and time.           Labs and Studies    Labs:  Recent Results (from the past 24 hour(s))   Comprehensive metabolic panel    Collection Time: 01/12/19  5:02 PM   Result Value Ref Range    Sodium 138 135 - 145 mmol/L    Potassium 4.2 3.5 - 5.0 mmol/L    Chloride 102 98 - 107 mmol/L    Anion Gap 13 7 - 15 mmol/L    CO2 23.0 22.0 - 30.0 mmol/L    BUN 4 (L) 7 - 21 mg/dL    Creatinine 1.61 (L) 0.60 - 1.00 mg/dL    BUN/Creatinine Ratio 8     EGFR CKD-EPI Non-African American, Female >90 >=60 mL/min/1.42m2    EGFR CKD-EPI African American, Female >90 >=60 mL/min/1.62m2    Glucose 104 70 - 179 mg/dL    Calcium 9.3 8.5 - 09.6 mg/dL    Albumin 3.5 3.5 - 5.0 g/dL    Total  Protein 5.9 (L) 6.5 - 8.3 g/dL    Total Bilirubin 1.0 0.0 - 1.2 mg/dL    AST 47 (H) 14 - 38 U/L    ALT 41 (H) <35 U/L    Alkaline Phosphatase 75 38 - 126 U/L   Lipase Level    Collection Time: 01/12/19  5:02 PM   Result Value Ref Range    Lipase 520 (H) 44 - 232 U/L   CBC w/ Differential    Collection Time: 01/12/19  5:02 PM   Result Value Ref Range    WBC 9.3 4.5 - 11.0 10*9/L    RBC 5.36 (H) 4.00 - 5.20 10*12/L    HGB 15.3 13.5 - 16.0 g/dL    HCT 74.2 (H) 59.5 - 46.0 %    MCV 88.0 80.0 - 100.0 fL    MCH 28.5 26.0 - 34.0 pg    MCHC 32.4 31.0 - 37.0 g/dL    RDW 63.8 (H) 75.6 - 15.0 %    MPV 7.4 7.0 - 10.0 fL    Platelet 274 150 - 440 10*9/L    Neutrophils % 89.4 %    Lymphocytes % 6.9 %    Monocytes % 2.2 %    Eosinophils % 0.3 %    Basophils % 0.3 %    Neutrophil Left Shift 1+ (A) Not Present    Absolute Neutrophils 8.4 (H) 2.0 - 7.5 10*9/L    Absolute Lymphocytes 0.7 (L) 1.5 - 5.0 10*9/L    Absolute Monocytes 0.2 0.2 - 0.8 10*9/L    Absolute Eosinophils 0.0 0.0 - 0.4 10*9/L    Absolute Basophils 0.0 0.0 - 0.1 10*9/L    Large Unstained Cells 1 0 - 4 %    Anisocytosis Slight (A) Not Present         Imaging:   Radiology studies were personally reviewed    No results found.      Problem List    Patient Active Problem List   Diagnosis   ??? Mood disorder (CMS-HCC)   ??? Morbid obesity with BMI of 50.0-59.9, adult (CMS-HCC)   ??? Iron deficiency anemia   ??? Dizziness   ??? Migraine without status migrainosus, not intractable   ??? Pap smear with low grade squamous intraepithelial lesion (LGSIL)   ??? Encounter for tubal ligation   ??? Elevated blood-pressure reading without diagnosis of hypertension   ??? Vitamin D deficiency   ??? Menorrhagia   ??? High risk medication use   ??? Sacroiliitis (CMS-HCC)   ??? Immunization counseling   ??? Abnormal skin growth   ??? Ankylosing spondylitis (CMS-HCC)   ??? CRP elevated   ??? Dyslipidemia   ??? Elevated hemoglobin A1c   ??? Herpes zoster without complication   ??? Horseshoe kidney   ??? Hypertension   ??? Hypothyroidism   ??? Osteitis condensans ilii   ??? Anxiety and depression   ??? Severe bipolar I disorder, most recent episode depressed without psychotic features (CMS-HCC)   ??? Status post tubal ligation   ??? Thrombocytosis (CMS-HCC)              Assessment      Jacqueline Hall is a 33 y.o. female who underwent robotic sleeve gastrectomy for morbid obesity on 11/18/3028 and presents with refractory nausea since 1.5weeks post-op from her surgery. Differential diagnoses include sleeve obstruction/edema (less likely) vs post-operative nausea (more likely) vs vestibular issue.        Plan:  - Admit to general surgery  - FLD,  NPO at MN  - mIVFs  - GI consult -- order placed, page in AM  - CMP, lipase, CBC  - SQH for dvt ppx    Teaching Surgeon Attestation:  I, D. Remigio Eisenmenger M.D., saw and evaluated the patient. I have reviewed and edited the above note, and I agree with the findings and the plan of care as documented in the Resident's note.

## 2019-01-13 NOTE — Unmapped (Signed)
Daily Progress Note    Assessment/Plan:    33 year old female with PMH of morbid obesity and significant surgical history of gastrectomy 11/19/2018 was transferred to [facility] for refractory nausea for about 6 weeks.   Elevated liver enzymes and lipase though no abdominal pain.     --consult appreciated from GI at this time  --will obtain repeat UA   --continue NPO while we await GI recommendations, MIVF  --Pain: tylenol solution PRN, oxycodone solution PRN  --Nausea: compazine IV PRN  - Home meds: levothyroxine ordered.        Subjective:    Patient reports that she continues to have nausea and dizziness.  Dizziness is described as both the room spinning around her and that she feel dizzy.  She denies fever.  She has no emesis at this time.  She is passing flatus.  She reports no abdominal pain.  She reports tinnitus and double vision today.  She denies dysuria, hematuria, or lower abdominal pain.       Objective:    Vital signs in last 24 hours:  Temp:  [35.8 ??C (96.4 ??F)-36.4 ??C (97.5 ??F)] 36.2 ??C (97.2 ??F)  Heart Rate:  [70-107] 70  Resp:  [18-20] 18  BP: (125-148)/(74-88) 125/82  MAP (mmHg):  [94-105] 99  SpO2:  [92 %-98 %] 95 %  BMI (Calculated):  [48.77] 48.77    Intake/Output last 3 shifts:  I/O last 3 completed shifts:  In: 1086.7 [I.V.:1086.7]  Out: 400 [Urine:400]  Intake/Output this shift:  I/O this shift:  In: -   Out: 800 [Urine:800]    Physical Exam:  BP 109/70  - Pulse 83  - Temp 36.2 ??C (97.1 ??F) (Temporal)  - Resp 18  - Ht 154.9 cm (5' 1)  - Wt (!) 117 kg (258 lb)  - SpO2 95%  - BMI 48.75 kg/m??   General appearance: alert, cooperative and no distress  Lungs: normal breath sounds, in no acute respiratory distress.   Heart: RR  Abdomen: soft, non-distended, non-tender to palpation  Skin: normal skin turgor and perfusion    Trena Platt, PA-C  General Surgery  01/13/2019  12:23 PM    It was medically necessary for me to see the patient in addition to the APP because this is a postoperative patient with chronic nausea limiting oral intake.  My visit included in person review of the subjective findings, key components of the physical exam, and assistance with creation of the assessment and plan. Aida Raider, MD

## 2019-01-13 NOTE — Unmapped (Signed)
GASTROENTEROLOGY [facility]) INPATIENT CONSULTATION H&P      Requesting Attending Physician:  Colon Branch, MD  Requesting Consult Service: Gen surg    Reason for Consult:    Jacqueline Hall is a 33 y.o. female seen in consultation at the request of Dr. Colon Branch, MD for nausea and vomiting.    Assessment and Recommendations:     33 y.o. female with PMHx of morbid obesity s/p robotic gastric sleeve (11/19/2018), HTN, IBS-mixed, bipolar disorder, PTSD and depression who is being seen in consultation for postop nausea and vomiting.  Patient's symptoms started about two weeks after her surgery and has only minimally responded to anti-emetics and low dose centrally acting agents.  EGD 12/16/2018 showed mild angulation of gastric body, now s/p dilation to 20mm.  There was no anatomical cause found to explain her dysphagia and N/V and it would be unlikely for her to develop a new structural cause of her symptoms in the last few weeks.  Differential diagnosis includes postoperative nausea and vomiting, reflux disease, esophageal dysmotility, gastroparesis, and functional GI disorder.  She is still only 6 weeks out from her surgery and within the recovery period, which can take months to improve. She denies heartburn or reflux symptoms, however reflux can certainly worsen after gastric sleeve, and would therefore start a daily PPI.  She denies dysphagia or odynophagia prior to her surgery and it would be unlikely for her to have developed a new esophageal motility disorder, however this can be further worked up as an outpatient with an esophageal manometry if she has persistent symptoms.  Despite her upper GI series noting delayed transit into narrowed portion of gastric sleeve, there was no delay in transit into her proximal small bowel and her gastric body angulation was dilated to 20mm on EGD with no improvement, making gastroparesis less likely (albiet upper GI series only used liquid contrast, would need outpatient GES to further work up).      Patient has an extensive psychiatric history, history of IBS and just had GI surgery, which would make a FGID (functional GI disorder; hypersensitivity) high on the differential.  Treatment for FGID disorders include lifestyle modifications and centrally acting agents.  Patient was started on low-dose mirtazapine with no significant improvement.  Management of FGID's can be challenging and often require trials of different medications and dosages before achieving adequate symptom control.  She should therefore be followed in GI clinic for chronic management of her symptoms and further discussion of NAFLD.     Patient has evidence of fatty infiltration on CT A/P and mild elevates transaminases with normal tbili and alk phos.  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  Of note, patient reports being told she had a UTI at the OSH.  If so, this can worsen her nausea and vomiting.      - please send UA and urine culture  - check HAV IgG and HBVs Ag/Ab and HBVc Ab  - recommend starting pantoprazole 40mg  daily  - discontinue mirtazapine  - start desipramine 12.5mg  qHS   - can increase to 25mg  qHS in 1 week if tolerating dose  - agree with phenergan PRN  - continue miralax BID  - advance diet as tolerated  - discourage alcohol consumption and emphasized importance of lifestyle changes for weight loss  - outpatient GI follow up with  Dr. Loel Dubonnet (we will arrange once closer to discharge)       Thank you for this consult. Seen and discussed with Dr. Stevphen Rochester.  We will continue to follow along. Please page the GI [facility] consult pager at [phone] with any further questions or change in clinical condition.    [name], MD  Gastroenterology and Hepatology Fellow, PGY-6  [employer], [place]    I saw and evaluated the patient, participating in the key portions of the service.  I reviewed the resident???s note.  I agree with the resident???s findings and plan. Theadore Nan, MD         HPI:     Chief Complaint: nausea and vomiting    History of Present Illness:   This is a 33 y.o. female with PMHx of morbid obesity s/p robotic gastric sleeve (11/19/2018), HTN, IBS-mixed, bipolar disorder, PTSD and depression who is being seen in consultation for postop nausea and vomiting. Patient reports a long history of IBS prior to her surgery and would swing from diarrhea (up to 4 loose BMs a day) to constipation (no bowel movement for 2-3 days).  She underwent robotic gastric sleeve on 11/19/2018 for morbid obesity.  About two weeks after her surgery, she developed nausea and vomiting.  She reports waking up without nausea, however will develop severe nausea immediately after eating or drinking which will then lead to nonbloody, nonbilious emesis about 2 minutes later.  This does not occur with all PO intake, and there is no clear pattern with the types of foods or liquids she tries to take.  She reports up to 5-7 episodes per day and has been able to tolerate max 48oz of liquids a day (goal of 64oz per bariatric team).  She has been able to swallow her smaller pills, but notes feeling like larger pills get stuck.    She was seen in GI surgery clinic on 12/09/2018 for these symptoms. She noted no improvement with ODT zofran and mild improvement with phenergan, mainly because it caused her to sleep.  An upper GI series on 12/10/2018 showed mild to moderate narrowing of the gastric sleeve at the level of the mid gastric body concerning for stricture vs post surgical changes.  She underwent an EGD on 12/16/2018 which revealed a normal esophagus, small hiatal hernia, evidence of sleeve gastrectomy and mild angulation in the distal gastric body which was dilated to 20mm, and normal duodenum.  She was again seen in GI surgery clinic on 12/23/2018 reporting persistent nausea and vomiting about 30-40 mins after drinking.  She was only able to keep down 24 oz (goal of 64 oz) of fluid per day at that time. She also noted significant constipation without a bowel movement for 9 days.  She was started on mirtazapine 15mg  qHS (with plans to increase to 30mg  qHS in 1 week),  scheduled ODT zofran q8h, promethazine PRN and discussed the possible addition of zyprexa 5mg  qHS.  She was recommened to take miralax BID and use milk of magnesia or mag citrate as needed.  She reports improved sleep with the mirtazapine, but no improvement in daytime nausea and vomiting; however she has not increased the dose.  She has not tried zyprexa yet.  Her bowel movements improved closer to her baseline and she now reports a BM ever other day to 4x per day with the use of miralax BID.  Her stools are soft and pudding like.      However, her  nausea and vomiting persisted and she was seen at [facility] on 01/01/2019 for dehydration. A CT A/P reportedly had no acute intraabdominal abnormalities (images not available). She was treated with phenergan suppositories and scopolamine patch.  She then developed lightheadedness, tinnitus and double vision in the last 2 weeks which prompted a repeat ED evaluation at [facility] and subsequent transfer to [facility].  On arrival at [facility], she was hemodynamically stable and labs were largely unrevealing except for a lipase of 520.  She was started on IVFs and treated with phenergan PRN.  She was able to keep down water yesterday evening, however has remained NPO since.      Review of Systems:  The balance of 12 systems reviewed is negative except as noted in the HPI.     Medical History:     Past Medical History:  Past Medical History:   Diagnosis Date   ??? Abnormal Pap smear of cervix 2013    per patient; biopsy was done and then everything was fine   ??? Anemia 2013   ??? Ankylosing spondylitis (CMS-HCC)    ??? Arthritis 2018   ??? Bipolar 1 disorder (CMS-HCC) 2018   ??? Depression    ??? Disease of thyroid gland    ??? Elevated blood-pressure reading without diagnosis of hypertension    ??? Headache(784.0)     more than 2-3 times a week; takes Ibuprofen. could not afford prescription medication   ??? History of pre-eclampsia in prior pregnancy, currently pregnant 11/2011   ??? 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 Hypertension    ??? Menorrhagia    ??? Morbid obesity with BMI of 50.0-59.9, adult (CMS-HCC)    ??? Obesity    ??? PTSD (post-traumatic stress disorder) 2018   ??? Sleep apnea        Surgical History:  Past Surgical History:   Procedure Laterality Date   ??? CHOLECYSTECTOMY  12/20/2007    Initially done L/S then had some sort of complication, required endoscopy   ??? PR LAP, GAST RESTRICT PROC, LONGITUDINAL GASTRECTOMY Midline 11/19/2018    Procedure: R25 ROBOTIC PR LAP, GAST RESTRICT PROC, LONGITUDINAL GASTRECTOMY;  Surgeon: Colon Branch, MD;  [facility];  Service: General Surgery   ??? PR UPPER GI ENDOSCOPY,DIAGNOSIS N/A 07/17/2016    Procedure: UGI ENDO, INCLUDE ESOPHAGUS, STOMACH, & DUODENUM &/OR JEJUNUM; DX W/WO COLLECTION SPECIMN, BY BRUSH OR WASH;  Surgeon: Annie Paras, MD;  [facility]: [facility];  Service: Gastroenterology   ??? PR UPPER GI ENDOSCOPY,W/DILAT,GASTRIC OUT N/A 12/16/2018    Procedure: UGI ENDO; W/DILATION GASTRIC OUTLET FOR OBSTRUCTION;  Surgeon: Chriss Driver, MD;  [facility]: [facility];  Service: Gastroenterology   ??? TUBAL LIGATION         Family History:  The patient's family history includes Atrial fibrillation in her paternal grandmother; Dementia in her maternal grandmother; Diabetes in her maternal grandmother; Heart attack in her maternal grandmother; Heart disease in her maternal grandfather and maternal grandmother; Heart failure in her paternal grandmother; Hypertension in her brother, father, maternal grandmother, and sister; Hypothyroidism in her maternal aunt; Migraines in her maternal aunt; Multiple sclerosis in her maternal uncle; No Known Problems in her mother; Osteoporosis in her maternal grandmother; Stroke in her maternal grandmother..    Medications:   Current Facility-Administered Medications   Medication Dose Route Frequency Provider Last Rate Last Dose   ??? acetaminophen (TYLENOL) solution 650 mg  650 mg Enteral tube: gastric  Q6H PRN  Levan Hurst, MD       ??? heparin (porcine) 7,500 units/0.75 mL syringe  7,500 Units Subcutaneous [facility] Levan Hurst, MD   7,500 Units at 01/13/19 0606   ??? lactated Ringers infusion  100 mL/hr Intravenous Continuous Levan Hurst, MD 100 mL/hr at 01/13/19 0400 100 mL/hr at 01/13/19 0400   ??? levothyroxine (SYNTHROID) tablet 25 mcg  25 mcg Oral Daily Levan Hurst, MD   25 mcg at 01/13/19 0606   ??? oxyCODONE (ROXICODONE) 5 mg/5 mL solution 5 mg  5 mg Oral Q4H PRN Levan Hurst, MD       ??? prochlorperazine (COMPAZINE) injection 5 mg  5 mg Intravenous Q6H PRN Levan Hurst, MD           Allergies:   Patient has no known allergies.    Social History:  Social History     Tobacco Use   ??? Smoking status: Never Smoker   ??? Smokeless tobacco: Never Used   Substance Use Topics   ??? Alcohol use: Never   ??? Drug use: Never       Objective:      Vital Signs/Weight:  Temp:  [35.8 ??C-36.4 ??C] 36.3 ??C  Heart Rate:  [76-107] 76  Resp:  [18-20] 18  BP: (128-148)/(74-88) 128/74  MAP (mmHg):  [94-105] 94  SpO2:  [92 %-98 %] 98 %  BMI (Calculated):  [48.77] 48.77  Wt Readings from Last 3 Encounters:   01/12/19 (!) 117 kg (258 lb)   12/23/18 (!) 121.6 kg (268 lb 1.6 oz)   12/16/18 (!) 125.6 kg (277 lb)       Physical Exam:  GEN: well-appearing obese female, laying in bed in NAD  EYES: EOMI, no scleral icterus  ENT: ATNC, MMM, OP clear  NECK: supple, no LAD  CV: RRR, normal S1S2, no murmurs appreciated  PULM: no increased WOB, CTAB  ABD: soft, ND, mild TTP in the L flank region, no rebound or guarding, +BS  EXT: warm, no peripheral edema  PSYCH: awake and alert, appropriate affect  NEURO: moves all 4 extremities, answers questions appropriately  SKIN: no rashes or jaundice    Diagnostic Studies:  I reviewed all pertinent diagnostic studies, including:      Labs:    Recent Labs     01/12/19  1702   WBC 9.3   HGB 15.3   HCT 47.2*   PLT 274     Recent Labs     01/12/19  1702   NA 138   K 4.2   CL 102   BUN 4*   CREATININE 0.51*   GLU 104     Recent Labs     01/12/19  1702   PROT 5.9*   ALBUMIN 3.5   AST 47*   ALT 41*   ALKPHOS 75   BILITOT 1.0     No results for input(s): INR, APTT, FIBRINOGEN in the last 72 hours.  No results for input(s): CRP in the last 72 hours.  No results for input(s): IRON, TIBC, FERRITIN in the last 72 hours.    Imaging:   Radiology studies were personally reviewed     Microbiology:    GI Procedures:   EGD 12/16/2018:       The esophagus was normal.       A small hiatal hernia was present.       Evidence of a sleeve gastrectomy found in the gastric body. This was  characterized by an intact appearance.       A deformity was found in the distal gastric body with mild angulation. A        TTS dilator was passed through the scope. Dilation with an 18-19-20 mm        balloon dilator was performed to 20 mm.       The examined duodenum was normal.

## 2019-01-13 NOTE — Unmapped (Signed)
CM spoke with patient to complete assessment and discuss d/c planning.  Patient is alert and oriented and lives with her husband and 3 children, ages 47, 24 and 89.  Patient is on a LOA from work in [employer] currently.  Her husband recently started a new job.  No concerns to note per patient.  She will have a ride at discharge.  No needs noted.        Care Management  Initial Transition Planning Assessment   Type of Residence: Mailing Address:  [address]  [place] [address]  Contacts: Accompanied by: Alone   Extended Emergency Contact Information  Primary Emergency Contact: Su [place]  Home Phone: [phone]  Mobile Phone: [phone]  Relation: Grandparent  Preferred language: ENGLISH  Interpreter needed? No    Patient Phone Number: [phone] (home)           Medical Provider(s): Smitty Cords, DO  Reason for Admission: Admitting Diagnosis:  Intractable Nausea  Past Medical History:   has a past medical history of Abnormal Pap smear of cervix (2013), Anemia (2013), Ankylosing spondylitis (CMS-HCC), Arthritis (2018), Bipolar 1 disorder (CMS-HCC) (2018), Depression, Disease of thyroid gland, Elevated blood-pressure reading without diagnosis of hypertension, Headache(784.0), History of pre-eclampsia in prior pregnancy, currently pregnant (11/2011), 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  Past Surgical History:   has a past surgical history that includes Cholecystectomy (12/20/2007); Tubal ligation; pr upper gi endoscopy,diagnosis (N/A, 07/17/2016); pr lap, gast restrict proc, longitudinal gastrectomy (Midline, 11/19/2018); and pr upper gi endoscopy,w/dilat,gastric out (N/A, 12/16/2018).   Previous admit date: 11/19/2018    Primary Insurance- Payor: BCBS / Plan: BCBS OOS PLAN / Product Type: *No Product type* /   Secondary Insurance ??? None  Prescription Coverage ??? BCBS  [facility] - [phone] [place], [place] - [address]  [facility] [place] - [address]  [facility] [place] - [address]  [place] [facility]    Transportation home: Private vehicle, patient states she will have a ride at d/c.   Level of function prior to admission: Independent                 Social research officer, government assessed the patient by : In person interview with patient, Discussion with Clinical Care team, Medical record review  Orientation Level: Oriented X4  Who provides care at home?: Family member  Reason for referral: Discharge Planning    Contact/Decision Maker  Extended Emergency Contact Information  Primary Emergency Contact: Su [place]  Home Phone: [phone]  Mobile Phone: [phone]  Relation: Grandparent  Preferred language: ENGLISH  Interpreter needed? No    Legal Next of Kin / Guardian / POA / Advance Directives     HCDM (patient stated preference): Pittman,Judy - Grandparent [phone]    Advance Directive (Medical Treatment)  Does patient have an advance directive covering medical treatment?: Patient does not have advance directive covering medical treatment.  Reason patient does not have an advance directive covering medical treatment:: Patient does not wish to complete one at this time.    Health Care Decision Maker [HCDM] (Medical & Mental Health Treatment)  Healthcare Decision Maker: HCDM documented in the HCDM/Contact Info section.  Information offered on HCDM, Medical &  Mental Health advance directives:: Patient given information.         Patient Information  Lives with: Spouse/significant other, Children    Type of Residence: Private residence        Location/Detail: [address] [place], [place]    Support Systems: Children, Spouse    Responsibilities/Dependents at home?: Yes (Describe)(3 Children at home ages 76, 84 and 67)    Home Care services in place prior to admission?: No          Outpatient/Community Resources in place prior to admission: Clinic  Agency detail (Name/Phone #): PCP - Saralyn Pilar    Equipment Currently Used at Home: none       Currently receiving outpatient dialysis?: No       Financial Information       Need for financial assistance?: No(per patient, she is on a LOA from work in [employer].  Her husband was drawing unemployment and has recently started a new job.)       Social Determinants of Health  Social Determinants of Health were addressed in provider documentation.  Please refer to patient history.  Social History     Socioeconomic History   ??? Marital status: Married     Spouse name: Not on file   ??? Number of children: 3   ??? Years of education: Not on file   ??? Highest education level: Not on file   Occupational History   ??? Occupation: Unemployed   Social Needs   ??? Financial resource strain: Not on file   ??? Food insecurity     Worry: Never true     Inability: Never true   ??? Transportation needs     Medical: Not on file     Non-medical: Not on file   Tobacco Use   ??? Smoking status: Never Smoker   ??? Smokeless tobacco: Never Used   Substance and Sexual Activity   ??? Alcohol use: Never   ??? Drug use: Never   ??? Sexual activity: Yes     Partners: Male     Birth control/protection: Surgical   Lifestyle   ??? Physical activity     Days per week: Not on file     Minutes per session: Not on file   ??? Stress: Not on file   Relationships   ??? Social Wellsite geologist on phone: Not on file     Gets together: Not on file     Attends religious service: Not on file     Active member of club or organization: Not on file     Attends meetings of clubs or organizations: Not on file     Relationship status: Not on file   Other Topics Concern   ??? Exercise No   ??? Living Situation Yes     Comment: ?   Social History Narrative    Married 2 children, 1 stepchild. Previously worked at [employer] in [employer], currently (09/05/2016) unemployed.    Lives in [place].    Has tubal ligation.      Housing/Utilities   ??? Within the past 12 months, have you ever stayed: outside, in a car, in a tent, in an overnight shelter, or temporarily in someone else's home (i.e. couch-surfing)?     ??? Are you worried about losing your housing?     ??? Within the past 12 months, have you been unable to get utilities (heat, electricity) when it was really needed?  Literacy   ??? How often do you need to have someone help you when you read instructions, pamphlets, or other written material from your doctor or pharmacy?         Discharge Needs Assessment  Concerns to be Addressed: no discharge needs identified, denies needs/concerns at this time    Clinical Risk Factors: New Diagnosis    Barriers to taking medications: No([facility])    Prior overnight hospital stay or ED visit in last 90 days: No    Readmission Within the Last 30 Days: no previous admission in last 30 days         Anticipated Changes Related to Illness: none    Equipment Needed After Discharge: none    Discharge Facility/Level of Care Needs: other (see comments)(Home with Self Care)    Readmission  Risk of Unplanned Readmission Score: UNPLANNED READMISSION SCORE: 11%  Predictive Model Details           11% (Low) Factors Contributing to Score   Calculated 01/13/2019 11:42 21% Number of active Rx orders is 20   [facility] Risk of Unplanned Readmission Model 15% Active antipsychotic Rx order is present     15% ECG/EKG order is present in last 6 months     10% Imaging order is present in last 6 months     9% Number of ED visits in last six months is 1     8% Number of hospitalizations in last year is 1     7% Diagnosis of deficiency anemia is present     7% Active anticoagulant Rx order is present     Readmitted Within the Last 30 Days? (No if blank)   Patient at risk for readmission?: No    Discharge Plan  Screen findings are: Care Manager reviewed the plan of the patient's care with the Multidisciplinary Team. No discharge planning needs identified at this time. Care Manager will continue to manage plan and monitor patient's progress with the team.    Expected Discharge Date:     Expected Transfer from Critical Care: (N/A)    Patient and/or family were provided with choice of facilities / services that are available and appropriate to meet post hospital care needs?: N/A       Initial Assessment complete?: Yes    Clenton Pare  January 13, 2019 11:49 AM
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
## 2019-01-14 DIAGNOSIS — R42 Dizziness and giddiness: Secondary | ICD-10-CM

## 2019-01-14 DIAGNOSIS — R112 Nausea with vomiting, unspecified: Secondary | ICD-10-CM

## 2019-01-14 DIAGNOSIS — H532 Diplopia: Secondary | ICD-10-CM

## 2019-01-14 LAB — URINALYSIS
GLUCOSE UA: NEGATIVE
KETONES UA: >80 — AB
LEUKOCYTE ESTERASE UA: NEGATIVE
NITRITE UA: NEGATIVE
PH UA: 7.5 (ref 5.0–9.0)
PROTEIN UA: NEGATIVE
SPECIFIC GRAVITY UA: 1.015 (ref 1.005–1.040)
SQUAMOUS EPITHELIAL: 8 /HPF — ABNORMAL HIGH (ref 0–5)
UROBILINOGEN UA: 0.2
WBC UA: 3 /HPF (ref 0–5)

## 2019-01-14 LAB — HEPATITIS B SURFACE ANTIGEN
HEPATITIS B SURFACE ANTIGEN: NONREACTIVE
Hepatitis B virus surface Ag:PrThr:Pt:Ser:Ord:: NONREACTIVE

## 2019-01-14 LAB — HEPATITIS B SURFACE ANTIBODY: Hepatitis B virus surface Ab:PrThr:Pt:Ser:Ord:: NONREACTIVE

## 2019-01-14 LAB — HEPATITIS A IGM ANTIBODY: Hepatitis A virus Ab.IgM:PrThr:Pt:Ser:Ord:: NONREACTIVE

## 2019-01-14 LAB — CLARITY

## 2019-01-14 LAB — ZINC: Zinc: 84 ug/dL (ref 56–134)

## 2019-01-14 MED ORDER — ACETAMINOPHEN 650 MG/20.3ML PO SOLN
650.00 | ORAL | Status: DC
Start: ? — End: 2019-01-14

## 2019-01-14 MED ORDER — DESIPRAMINE HCL 25 MG PO TABS
12.50 | ORAL_TABLET | ORAL | Status: DC
Start: 2019-01-14 — End: 2019-01-14

## 2019-01-14 MED ORDER — GENERIC EXTERNAL MEDICATION
Status: DC
Start: ? — End: 2019-01-14

## 2019-01-14 MED ORDER — HEPARIN SODIUM (PORCINE) 10000 UNIT/ML IJ SOLN
7500.00 | INTRAMUSCULAR | Status: DC
Start: 2019-01-14 — End: 2019-01-14

## 2019-01-14 MED ORDER — ESOMEPRAZOLE MAGNESIUM 40 MG PO PACK
40.00 | PACK | ORAL | Status: DC
Start: 2019-01-15 — End: 2019-01-14

## 2019-01-14 MED ORDER — OXYCODONE HCL 5 MG/5ML PO SOLN
5.00 | ORAL | Status: DC
Start: ? — End: 2019-01-14

## 2019-01-14 MED ORDER — LEVOTHYROXINE SODIUM 25 MCG PO TABS
25.00 | ORAL_TABLET | ORAL | Status: DC
Start: 2019-01-15 — End: 2019-01-14

## 2019-01-14 MED ORDER — LORAZEPAM 1 MG PO TABS
1.00 | ORAL_TABLET | ORAL | Status: DC
Start: ? — End: 2019-01-14

## 2019-01-14 MED ORDER — LACTATED RINGERS IV SOLN
100.00 | INTRAVENOUS | Status: DC
Start: ? — End: 2019-01-14

## 2019-01-14 MED ORDER — OLANZAPINE 5 MG DISINTEGRATING TABLET
ORAL_TABLET | Freq: Two times a day (BID) | ORAL | 1 refills | 15.00000 days | Status: CP | PRN
Start: 2019-01-14 — End: 2019-02-13

## 2019-01-14 MED ORDER — DESIPRAMINE 25 MG TABLET
ORAL_TABLET | Freq: Every evening | ORAL | 1 refills | 30.00000 days | Status: CP
Start: 2019-01-14 — End: ?

## 2019-01-14 MED ORDER — HALOPERIDOL 2 MG TABLET
ORAL_TABLET | Freq: Two times a day (BID) | ORAL | 0 refills | 30 days | Status: CP | PRN
Start: 2019-01-14 — End: 2019-02-13

## 2019-01-14 NOTE — Unmapped (Signed)
Paged Jacqueline Hall about:3H02 [place] pt  nauseated and requesting for nausea med  I told her she has Zyprexa and Haldol but she is requesting for Zofran. Zamaria Brazzle

## 2019-01-14 NOTE — Unmapped (Signed)
Daily Progress Note        Subjective:    No acute events overnight.  Upon arrival at bedside, patient was laying comfortably in bed with cell phone on and in lap.  Patient continues to report nausea, however denies emesis.  She reports that she thinks the nausea is slightly better.  She noted the Zyprexa making her feel slightly nauseous before feeling better.  To note, she was also given the desipramine at the same time.  She continues to pass flatus.  She was last given anti-emetic last night.      Objective:    Vital signs in last 24 hours:  Temp:  [35.9 ??C (96.6 ??F)-36.2 ??C (97.1 ??F)] 36 ??C (96.8 ??F)  Heart Rate:  [74-101] 91  Resp:  [16-18] 16  BP: (109-139)/(70-88) 122/76  MAP (mmHg):  [85-106] 94  SpO2:  [93 %-97 %] 93 %    Intake/Output last 3 shifts:  I/O last 3 completed shifts:  In: 1086.7 [I.V.:1086.7]  Out: 1700 [Urine:1700]  Intake/Output this shift:  I/O this shift:  In: -   Out: 500 [Urine:500]    Physical Exam:  General appearance: alert, cooperative and no distress  Eyes: PERRL, slow tracking of right eye.    Lungs: normal respiratory effort in no acute respiratory distress.  Heart: RR  Abdomen: soft, non-distended, non-tender      Lab Results   Component Value Date    WBC 9.3 01/12/2019    HGB 15.3 01/12/2019    HCT 47.2 (H) 01/12/2019    PLT 274 01/12/2019       Lab Results   Component Value Date    NA 138 01/12/2019    K 4.2 01/12/2019    CL 102 01/12/2019    CO2 23.0 01/12/2019    BUN 4 (L) 01/12/2019    CREATININE 0.51 (L) 01/12/2019    GLU 104 01/12/2019    CALCIUM 9.3 01/12/2019    MG 1.9 11/20/2018    PHOS 3.3 11/20/2018       Lab Results   Component Value Date    BILITOT 1.0 01/12/2019    PROT 5.9 (L) 01/12/2019    ALBUMIN 3.5 01/12/2019    ALT 41 (H) 01/12/2019    AST 47 (H) 01/12/2019    ALKPHOS 75 01/12/2019       Lab Results   Component Value Date    INR 1.25 11/22/2018    APTT 32.9 11/22/2018     Assessment/Plan:    Active Problems:    PTSD (post-traumatic stress disorder)   Nausea LOS: 2 days       Neuro/Psych  Appreciate psych recommendations.  Zyprexa 2.33mb BID PRN anxiety/nausea, haloperidol 2mg  BID PRN 2nd line, lorazepam 1mg  PO BID PRN 3rd line added.  Desipramine qhs.  She also has the addition of desipramine qhs  Oxycodone solution PRN for pain  Tylenol PRN for pain   Pulmonary   O2 sats nl on RA.  Encourage IS and ambulation.   CV   AFVSS.  Continue vitals per protocol.   FEN/GI   LR 157mL/hr  Zyprexa BID PRN, Haldol BID PRN, Lorazepam PRN  Full liquid diet   GU   Voiding spontaneously without issue.  UA unremarkable   PPx   Heparin 7500 units q 8hr.   Dispo   Will plan to D/C today.  GI to follow opt.      Surgery on-call can be reached at pager [phone]    Trena Platt, PA-C  General Surgery  01/14/2019  12:35 PM     It was medically necessary for me to see the patient in addition to the APP because this is a postoperative sleeve gastrectomy patient with ongoing nausea of unclear etiology.  My visit included in person review of the subjective findings, key components of the physical exam, and assistance with creation of the assessment and plan. Aida Raider, MD

## 2019-01-14 NOTE — Unmapped (Signed)
Pt in bed states I feel better but just a little nausea pt encouraged to order something to eat clear liquid.  Problem: Adult Inpatient Plan of Care  Goal: Plan of Care Review  Outcome: Progressing  Goal: Patient-Specific Goal (Individualization)  Outcome: Progressing  Goal: Absence of Hospital-Acquired Illness or Injury  Outcome: Progressing  Goal: Optimal Comfort and Wellbeing  Outcome: Progressing  Goal: Readiness for Transition of Care  Outcome: Progressing  Goal: Rounds/Family Conference  Outcome: Progressing     Problem: Wound  Goal: Optimal Wound Healing  Outcome: Progressing     Problem: Adult Inpatient Plan of Care  Goal: Plan of Care Review  Outcome: Progressing  Goal: Patient-Specific Goal (Individualization)  Outcome: Progressing  Goal: Absence of Hospital-Acquired Illness or Injury  Outcome: Progressing  Goal: Optimal Comfort and Wellbeing  Outcome: Progressing  Goal: Readiness for Transition of Care  Outcome: Progressing  Goal: Rounds/Family Conference  Outcome: Progressing     Problem: Wound  Goal: Optimal Wound Healing  Outcome: Progressing

## 2019-01-14 NOTE — Unmapped (Signed)
Discharge Summary    Admit date: 01/12/2019    Discharge date and time: 01/14/2019 16:48    Discharge to:  Home    Discharge Service: General Surgery    Discharge Attending Physician: Colon Branch, MD    Discharge  Diagnoses: nausea, dizziness, tinnitus, anxiety, PTSD    Secondary Diagnosis: Active Problems:    PTSD (post-traumatic stress disorder) POA: Unknown  Resolved Problems:    * No resolved hospital problems. *      OR Procedures:       Ancillary Procedures: no procedures    Discharge Day Services:     Subjective   No acute events overnight. Pain Controlled. No fever or chills. Patient is tolerating a bariatric diet without emesis.  She is ambulating independently.  She is urinating without complication..    Objective   Patient Vitals for the past 8 hrs:   BP Temp Temp src Pulse Resp SpO2   01/14/19 1400 125/89 35.8 ??C (96.4 ??F) Oral 99 16 98 %   01/14/19 1100 122/76 36 ??C (96.8 ??F) Oral 91 16 93 %     I/O this shift:  In: -   Out: 500 [Urine:500]    Physical Exam  Constitutional:       Appearance: Normal appearance.   HENT:      Head: Atraumatic.   Eyes:      Pupils: Pupils are equal, round, and reactive to light.   Cardiovascular:      Rate and Rhythm: Normal rate.   Pulmonary:      Effort: Pulmonary effort is normal. No respiratory distress.   Abdominal:      General: Bowel sounds are normal. There is no distension.      Palpations: Abdomen is soft.      Tenderness: There is no abdominal tenderness.   Skin:     General: Skin is warm and dry.      Capillary Refill: Capillary refill takes less than 2 seconds.   Neurological:      Mental Status: She is alert.   Psychiatric:         Mood and Affect: Mood normal.       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  Patient was admitted for work-up and fluid resuscitation.  Patient was placed n.p.o. at midnight for potential GI consult in the morning.  She was maintained on IV fluids.  No acute events occurred overnight.  Patient was noted to tolerate a full liquid diet and consumed an entire tray.  GI consulted: Assessment felt it was unlikely that she developed a new structural cause of her symptoms.  Recommended outpatient fiber scan and lifestyle weight loss.  Recommended to discontinue mirtazapine and start desipramine.  They also recommended GI follow-up outpatient.  Psych was also consulted who recommended the first line Zyprexa twice daily as needed, 2nd line Haldol twice daily as needed and 3rd line lorazepam twice daily as needed.  The recommendation was also to hold the Lamictal at this time.  It was noted that zyprexa had helped.  At the time of her discharge, patient was able to tolerate her bariatric diet.  We will maintain the recommendations of the GI and psychiatric consult.  These medications were sent to her pharmacy.  Discharge instructions were given detailing  the start of her new medications as well as the discontinuance of others (Lamictal, Zofran, mirtazapine).  At the time of discharge, patient is able to tolerate a bariatric diet, ambulate independently, void without complications.  She had no episodes of emesis during her hospitalization.  Contact was made with GI to proceed with outpatient appointment.  A referral was also placed for neurology consult regarding her dizziness, tinnitus and vision changes.  This was explained to the patient.  She is agreeable to plan.        Condition at Discharge: Improved  Discharge Medications:      Medication List      START taking these medications    ??? desipramine 25 MG tablet; Commonly known as: NORPRAMIN; Take 0.5 tablets   (12.5 mg total) by mouth nightly.  ??? haloperidoL 2 MG tablet; Commonly known as: HALDOL; Take 1 tablet (2 mg   total) by mouth two (2) times a day as needed (for nausea/anxiety if   zyprexa is ineffective).  ??? OLANZapine zydis 5 MG disintegrating tablet; Commonly known as: ZyPREXA   Zydis; Take 0.5 tablets (2.5 mg total) by mouth two (2) times a day as   needed (for nausea/anxiety).; Replaces: OLANZapine 5 MG tablet     CHANGE how you take these medications    ??? levothyroxine 25 MCG tablet; Commonly known as: SYNTHROID; Take 1.5   tablets (37.5 mcg total) by mouth daily.; What changed: how much to take     CONTINUE taking these medications    ??? esomeprazole 40 MG capsule; Commonly known as: NexIUM; Take 1 capsule   (40 mg total) by mouth daily. Please open capsule and mix in drink.  ??? HUMIRA(CF) PEN 40 mg/0.4 mL injection; Generic drug: adalimumab; INJECT   40 MG (0.4 ML) UNDER THE SKIN EVERY 14 DAYS  ??? MAGOX 400 mg (241.3 mg magnesium) tablet; Generic drug: magnesium oxide  ??? MEDICAL SUPPLY ITEM; Please provide patient with 2 left carpal tunnel   syndrome braces for wrists.  ??? NASCOBAL 500 mcg/spray Spry; Generic drug: cyanocobalamin (vitamin   B-12); 1 spray into 1 nare once a week  ??? ORILISSA 150 mg Tab; Generic drug: elagolix  ??? promethazine 12.5 MG tablet; Commonly known as: PHENERGAN; Take 1 tablet   (12.5 mg total) by mouth every eight (8) hours.  ??? spironolactone 50 MG tablet; Commonly known as: ALDACTONE     STOP taking these medications    ??? lamoTRIgine 200 MG tablet; Commonly known as: LaMICtal  ??? mirtazapine 15 MG tablet; Commonly known as: REMERON  ??? OLANZapine 5 MG tablet; Commonly known as: ZyPREXA; Replaced by:   OLANZapine zydis 5 MG disintegrating tablet  ??? ondansetron 4 MG disintegrating tablet; Commonly known as: ZOFRAN-ODT  ??? propranoloL 20 MG tablet; Commonly known as: INDERAL  ??? sucralfate 100 mg/mL suspension; Commonly known as: CARAFATE  ??? topiramate 25 MG capsule; Commonly known as: TOPAMAX       Pending Test Results:     Discharge Instructions:  Activity:   Activity Instructions     Activity as tolerated Diet:  Diet Instructions     Discharge diet (specify)      Discharge Nutrition Therapy: Bariatric        Other Instructions:  Other Instructions     Call MD for:      If you have a non-emergency regarding your surgery, please call our clinic RN at [phone].    If you have an emergency during regular business hours regarding  your surgery, please go to your nearest Emergency Department   OR call our clinic RN at [phone]   OR call Dr. Augustine Radar on his Cell: [phone]   OR call the hospital operator at [phone] and ask for the [facility] General Surgeon On-Call: Attending Consultant.  The On-Call Surgeon will be paged and will call you back.      If you have an emergency outside of regular business hours regarding your surgery, please go to your nearest Emergency Department.         Call MD for:  persistent nausea or vomiting      Call MD for:  redness, tenderness, or signs of infection (pain, swelling, redness, odor or green/yellow discharge around incision site)      Call MD for:  severe uncontrolled pain      Call MD for: Temperature > 38.5 Celsius ( > 101.3 Fahrenheit)      Discharge instructions      Please utilize the zyprexa if you are having nausea.  If this is not working, then you may try the haldol.  You may also try the phenergan for nausea.  Do not take any at the same time.  You will take the desipramine nightly.  This can be increased to 1 full tablet daily, however we will await your follow up with gastroenterology to increase this dosage.     Please await contact for referral to neurology.  You will also receive contact for appointment with gastroenterology about your nausea.             Labs and Other Follow-ups after Discharge:  Follow Up instructions and Outpatient Referrals     Ambulatory referral to Neurology      Call MD for:      Call MD for:  persistent nausea or vomiting      Call MD for:  redness, tenderness, or signs of infection (pain, swelling, redness, odor or green/yellow discharge around incision site)      Call MD for:  severe uncontrolled pain      Call MD for: Temperature > 38.5 Celsius ( > 101.3 Fahrenheit)      Discharge instructions            Future Appointments:  Appointments which have been scheduled for you    Jan 20, 2019 11:00 AM  (Arrive by 10:45 AM)  PHONE with Otho Perl, MD  [facility] RHEUMATOLOGY [address] [facility] REGION) [id number]  [place] [address]  [phone]   Please DO NOT come to the clinic for this visit. We will call you to discuss your plan of care.           It was medically necessary for me to see the patient in addition to the APP because this is a postoperative patient being prepared for discharge.  My visit included examination of the patient including subjective findings and key components of the physical exam, as well as discharge counseling. Aida Raider, MD

## 2019-01-14 NOTE — Unmapped (Signed)
AAOx4. Room air, no shortness of breath noted. PIV to BUE, with IVF infusing without complications. Minimal abdominal pain noted. Did not tolerate full liquid diet this morning. Skin intact. Bed low, call light in reach, side rails up x2. Will continue to monitor.       Problem: Adult Inpatient Plan of Care  Goal: Plan of Care Review  Outcome: Progressing  Goal: Patient-Specific Goal (Individualization)  Outcome: Progressing  Goal: Absence of Hospital-Acquired Illness or Injury  Outcome: Progressing  Goal: Optimal Comfort and Wellbeing  Outcome: Progressing  Goal: Readiness for Transition of Care  Outcome: Progressing  Goal: Rounds/Family Conference  Outcome: Progressing     Problem: Wound  Goal: Optimal Wound Healing  Outcome: Progressing

## 2019-01-15 ENCOUNTER — Emergency Department: Payer: BC Managed Care – PPO

## 2019-01-15 ENCOUNTER — Encounter: Payer: Self-pay | Admitting: *Deleted

## 2019-01-15 ENCOUNTER — Inpatient Hospital Stay
Admission: EM | Admit: 2019-01-15 | Discharge: 2019-01-18 | DRG: 065 | Disposition: A | Discharge status: Home / Self Care | Payer: BC Managed Care – PPO | Attending: Internal Medicine | Admitting: Internal Medicine

## 2019-01-15 ENCOUNTER — Other Ambulatory Visit: Payer: Self-pay

## 2019-01-15 DIAGNOSIS — K219 Gastro-esophageal reflux disease without esophagitis: Secondary | ICD-10-CM | POA: Diagnosis present

## 2019-01-15 DIAGNOSIS — E039 Hypothyroidism, unspecified: Secondary | ICD-10-CM | POA: Diagnosis not present

## 2019-01-15 DIAGNOSIS — Z833 Family history of diabetes mellitus: Secondary | ICD-10-CM

## 2019-01-15 DIAGNOSIS — H499 Unspecified paralytic strabismus: Secondary | ICD-10-CM | POA: Diagnosis not present

## 2019-01-15 DIAGNOSIS — Z20828 Contact with and (suspected) exposure to other viral communicable diseases: Secondary | ICD-10-CM | POA: Diagnosis not present

## 2019-01-15 DIAGNOSIS — H4923 Sixth [abducent] nerve palsy, bilateral: Secondary | ICD-10-CM | POA: Diagnosis not present

## 2019-01-15 DIAGNOSIS — G4733 Obstructive sleep apnea (adult) (pediatric): Secondary | ICD-10-CM | POA: Diagnosis present

## 2019-01-15 DIAGNOSIS — K589 Irritable bowel syndrome without diarrhea: Secondary | ICD-10-CM | POA: Diagnosis not present

## 2019-01-15 DIAGNOSIS — E46 Unspecified protein-calorie malnutrition: Secondary | ICD-10-CM | POA: Diagnosis not present

## 2019-01-15 DIAGNOSIS — F319 Bipolar disorder, unspecified: Secondary | ICD-10-CM | POA: Diagnosis present

## 2019-01-15 DIAGNOSIS — Z9049 Acquired absence of other specified parts of digestive tract: Secondary | ICD-10-CM | POA: Diagnosis not present

## 2019-01-15 DIAGNOSIS — E876 Hypokalemia: Secondary | ICD-10-CM | POA: Diagnosis present

## 2019-01-15 DIAGNOSIS — R000 Tachycardia, unspecified: Secondary | ICD-10-CM | POA: Diagnosis not present

## 2019-01-15 DIAGNOSIS — K9189 Other postprocedural complications and disorders of digestive system: Secondary | ICD-10-CM | POA: Diagnosis not present

## 2019-01-15 DIAGNOSIS — H532 Diplopia: Secondary | ICD-10-CM | POA: Diagnosis present

## 2019-01-15 DIAGNOSIS — R079 Chest pain, unspecified: Secondary | ICD-10-CM | POA: Diagnosis not present

## 2019-01-15 DIAGNOSIS — Z9884 Bariatric surgery status: Secondary | ICD-10-CM

## 2019-01-15 DIAGNOSIS — M069 Rheumatoid arthritis, unspecified: Secondary | ICD-10-CM | POA: Diagnosis present

## 2019-01-15 DIAGNOSIS — I10 Essential (primary) hypertension: Secondary | ICD-10-CM | POA: Diagnosis not present

## 2019-01-15 DIAGNOSIS — F4010 Social phobia, unspecified: Secondary | ICD-10-CM | POA: Diagnosis present

## 2019-01-15 DIAGNOSIS — G43909 Migraine, unspecified, not intractable, without status migrainosus: Secondary | ICD-10-CM | POA: Diagnosis present

## 2019-01-15 DIAGNOSIS — F4310 Post-traumatic stress disorder, unspecified: Secondary | ICD-10-CM | POA: Diagnosis not present

## 2019-01-15 DIAGNOSIS — Z79899 Other long term (current) drug therapy: Secondary | ICD-10-CM

## 2019-01-15 DIAGNOSIS — Z8261 Family history of arthritis: Secondary | ICD-10-CM

## 2019-01-15 DIAGNOSIS — R112 Nausea with vomiting, unspecified: Secondary | ICD-10-CM | POA: Diagnosis not present

## 2019-01-15 DIAGNOSIS — Z79890 Hormone replacement therapy (postmenopausal): Secondary | ICD-10-CM | POA: Diagnosis not present

## 2019-01-15 DIAGNOSIS — I639 Cerebral infarction, unspecified: Secondary | ICD-10-CM | POA: Diagnosis not present

## 2019-01-15 DIAGNOSIS — E860 Dehydration: Secondary | ICD-10-CM | POA: Diagnosis present

## 2019-01-15 DIAGNOSIS — E785 Hyperlipidemia, unspecified: Secondary | ICD-10-CM | POA: Diagnosis not present

## 2019-01-15 DIAGNOSIS — Y832 Surgical operation with anastomosis, bypass or graft as the cause of abnormal reaction of the patient, or of later complication, without mention of misadventure at the time of the procedure: Secondary | ICD-10-CM | POA: Diagnosis not present

## 2019-01-15 DIAGNOSIS — I479 Paroxysmal tachycardia, unspecified: Secondary | ICD-10-CM | POA: Diagnosis not present

## 2019-01-15 DIAGNOSIS — Z8249 Family history of ischemic heart disease and other diseases of the circulatory system: Secondary | ICD-10-CM

## 2019-01-15 HISTORY — DX: Other chest pain: R07.89

## 2019-01-15 HISTORY — DX: Morbid (severe) obesity due to excess calories: E66.01

## 2019-01-15 HISTORY — DX: Nausea with vomiting, unspecified: R11.2

## 2019-01-15 LAB — MAGNESIUM: Magnesium: 1.8 mg/dL (ref 1.7–2.4)

## 2019-01-15 LAB — CBC
HCT: 49.5 % — ABNORMAL HIGH (ref 36.0–46.0)
Hemoglobin: 16.5 g/dL — ABNORMAL HIGH (ref 12.0–15.0)
MCH: 28.4 pg (ref 26.0–34.0)
MCHC: 33.3 g/dL (ref 30.0–36.0)
MCV: 85.3 fL (ref 80.0–100.0)
Platelets: 376 10*3/uL (ref 150–400)
RBC: 5.8 MIL/uL — ABNORMAL HIGH (ref 3.87–5.11)
RDW: 15.1 % (ref 11.5–15.5)
WBC: 11.4 10*3/uL — ABNORMAL HIGH (ref 4.0–10.5)
nRBC: 0 % (ref 0.0–0.2)

## 2019-01-15 LAB — COMPREHENSIVE METABOLIC PANEL
ALT: 67 U/L — ABNORMAL HIGH (ref 0–44)
AST: 62 U/L — ABNORMAL HIGH (ref 15–41)
Albumin: 3.9 g/dL (ref 3.5–5.0)
Alkaline Phosphatase: 87 U/L (ref 38–126)
Anion gap: 16 — ABNORMAL HIGH (ref 5–15)
BUN: 6 mg/dL (ref 6–20)
CO2: 24 mmol/L (ref 22–32)
Calcium: 9.4 mg/dL (ref 8.9–10.3)
Chloride: 97 mmol/L — ABNORMAL LOW (ref 98–111)
Creatinine, Ser: 0.68 mg/dL (ref 0.44–1.00)
GFR calc Af Amer: >60 mL/min (ref >60)
GFR calc non Af Amer: >60 mL/min (ref >60)
Glucose, Bld: 91 mg/dL (ref 70–99)
Potassium: 3.1 mmol/L — ABNORMAL LOW (ref 3.5–5.1)
Sodium: 137 mmol/L (ref 135–145)
Total Bilirubin: 1.6 mg/dL — ABNORMAL HIGH (ref 0.3–1.2)
Total Protein: 7.1 g/dL (ref 6.5–8.1)

## 2019-01-15 LAB — TSH: TSH: 3.779 u[IU]/mL (ref 0.350–4.500)

## 2019-01-15 MED ORDER — HALOPERIDOL 2 MG PO TABS
2.0000 mg | ORAL_TABLET | Freq: Two times a day (BID) | ORAL | Status: DC | PRN
  Filled 2019-01-15: qty 1

## 2019-01-15 MED ORDER — PANTOPRAZOLE SODIUM 40 MG PO TBEC
40.0000 mg | DELAYED_RELEASE_TABLET | Freq: Every day | ORAL | Status: DC
  Administered 2019-01-16 – 2019-01-18 (×3): 40 mg via ORAL
  Filled 2019-01-15 (×4): qty 1

## 2019-01-15 MED ORDER — GENERIC EXTERNAL MEDICATION
Status: DC
Start: ? — End: 2019-01-15

## 2019-01-15 MED ORDER — ACETAMINOPHEN 325 MG PO TABS: 650.0000 mg | ORAL_TABLET | Freq: Four times a day (QID) | ORAL | Status: DC | PRN

## 2019-01-15 MED ORDER — LEVOTHYROXINE SODIUM 25 MCG PO TABS
25.0000 ug | ORAL_TABLET | Freq: Every day | ORAL | Status: DC
  Administered 2019-01-16 – 2019-01-18 (×3): 25 ug via ORAL
  Filled 2019-01-15 (×3): qty 1

## 2019-01-15 MED ORDER — ONDANSETRON HCL 4 MG PO TABS: 4.0000 mg | ORAL_TABLET | Freq: Four times a day (QID) | ORAL | Status: DC | PRN

## 2019-01-15 MED ORDER — THIAMINE HCL 100 MG/ML IJ SOLN
100.0000 mg | Freq: Once | INTRAMUSCULAR | Status: AC
  Administered 2019-01-15: 100 mg via INTRAVENOUS
  Filled 2019-01-15: qty 2

## 2019-01-15 MED ORDER — THIAMINE HCL 100 MG/ML IJ SOLN
500.0000 mg | INTRAVENOUS | Status: AC
  Administered 2019-01-16 (×2): 500 mg via INTRAVENOUS
  Filled 2019-01-15 (×2): qty 5

## 2019-01-15 MED ORDER — ENOXAPARIN SODIUM 40 MG/0.4ML ~~LOC~~ SOLN
40.0000 mg | Freq: Two times a day (BID) | SUBCUTANEOUS | Status: DC
  Administered 2019-01-16 – 2019-01-18 (×6): 40 mg via SUBCUTANEOUS
  Filled 2019-01-15 (×6): qty 0.4

## 2019-01-15 MED ORDER — PROMETHAZINE HCL 25 MG PO TABS
12.5000 mg | ORAL_TABLET | Freq: Four times a day (QID) | ORAL | Status: DC | PRN
  Filled 2019-01-15: qty 1

## 2019-01-15 MED ORDER — ASPIRIN EC 325 MG PO TBEC
325.0000 mg | DELAYED_RELEASE_TABLET | Freq: Every day | ORAL | Status: DC
  Administered 2019-01-16 – 2019-01-18 (×4): 325 mg via ORAL
  Filled 2019-01-15 (×4): qty 1

## 2019-01-15 MED ORDER — SODIUM CHLORIDE 0.9 % IV SOLN
1.0000 mg | Freq: Once | INTRAVENOUS | Status: AC
  Administered 2019-01-15: 1 mg via INTRAVENOUS
  Filled 2019-01-15: qty 0.2

## 2019-01-15 MED ORDER — ACETAMINOPHEN 650 MG RE SUPP: 650.0000 mg | Freq: Four times a day (QID) | RECTAL | Status: DC | PRN

## 2019-01-15 MED ORDER — LORAZEPAM 2 MG/ML IJ SOLN
1.0000 mg | Freq: Once | INTRAMUSCULAR | Status: AC
  Administered 2019-01-15: 1 mg via INTRAVENOUS
  Filled 2019-01-15: qty 1

## 2019-01-15 MED ORDER — POTASSIUM CHLORIDE 10 MEQ/100ML IV SOLN
10.0000 meq | INTRAVENOUS | Status: AC
  Administered 2019-01-16 (×2): 10 meq via INTRAVENOUS
  Filled 2019-01-15 (×3): qty 100

## 2019-01-15 MED ORDER — IMIPRAMINE HCL 25 MG PO TABS
25.0000 mg | ORAL_TABLET | Freq: Every day | ORAL | Status: DC
  Administered 2019-01-16 – 2019-01-17 (×3): 25 mg via ORAL
  Filled 2019-01-15 (×4): qty 1

## 2019-01-15 MED ORDER — OLANZAPINE 5 MG PO TBDP
2.5000 mg | ORAL_TABLET | Freq: Two times a day (BID) | ORAL | Status: DC | PRN
  Administered 2019-01-16: 2.5 mg via ORAL
  Filled 2019-01-15 (×2): qty 0.5

## 2019-01-15 MED ORDER — ONDANSETRON HCL 4 MG/2ML IJ SOLN: 4.0000 mg | Freq: Four times a day (QID) | INTRAMUSCULAR | Status: DC | PRN

## 2019-01-15 MED ORDER — SPIRONOLACTONE 25 MG PO TABS
50.0000 mg | ORAL_TABLET | Freq: Every day | ORAL | Status: DC
  Administered 2019-01-16 – 2019-01-17 (×2): 50 mg via ORAL
  Filled 2019-01-15 (×3): qty 2

## 2019-01-15 MED ORDER — DEXTROSE-NACL 5-0.45 % IV SOLN
INTRAVENOUS | Status: DC
  Administered 2019-01-16 – 2019-01-17 (×3): via INTRAVENOUS

## 2019-01-15 MED ORDER — ATORVASTATIN CALCIUM 20 MG PO TABS
40.0000 mg | ORAL_TABLET | Freq: Every day | ORAL | Status: DC
  Administered 2019-01-16 – 2019-01-17 (×3): 40 mg via ORAL
  Filled 2019-01-15 (×3): qty 2

## 2019-01-15 NOTE — ED Provider Notes (Signed)
[facility] Emergency Department Provider Note  Time seen: 6:04 PM  I have reviewed the triage vital signs and the nursing notes.   HISTORY  Chief Complaint Diplopia  HPI Leah Osborne is a 33 y.o. female with a past medical history of bipolar, arthritis, migraines, hypertension, hyperlipidemia, recent gastric sleeve in June of this year, presents to the emergency department for diplopia.  According to the patient she was admitted to [place] approximately 1 week ago, discharged yesterday after an admission for nausea vomiting dehydration/malnutrition.  Patient states since going home from [facility] her nausea and vomiting have improved significantly.  Patient states since June she has had fairly regular nausea and vomiting throughout the day.  Patient over the past 1 week has been experiencing blurred vision and diplopia.  States this was not evaluated at [facility], she was told to follow-up with her ophthalmologist after discharge which she did today.  Patient was seen by Dr. 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  Sent the patient to the ED today for further evaluation, concern for Wernicke's encephalopathy given her prolonged malnutrition since her gastric sleeve.   Past Medical History:  Diagnosis Date  . Abnormal Pap smear of cervix   . Arthritis   . Arthritis, rheumatoid ([place])   . Bipolar 1 disorder ([place])   . Chest pain   . Headache    MIGRAINES  . Hyperlipidemia   . Hypertension   . Palpitations    a. 09/2017 Zio Monitor: predominantly RSR, avg HR 87, 11 A tach/SVT episodes, fastest 176 x 7 beats, longest 9 beats. Rare PAC's, occas PVC's, rare couplets/triplets.  Marland Kitchen PTSD (post-traumatic stress disorder)   . PVC (premature ventricular contraction)   . Social phobia     Patient Active Problem List   Diagnosis Date Noted  . Intractable nausea and vomiting 01/10/2019  . S/P laparoscopic sleeve gastrectomy  01/01/2019  . Hypokalemia 01/01/2019  . Elevated hemoglobin A1c 02/12/2018  . Dyslipidemia 02/05/2018  . Abnormal skin growth 11/15/2016  . PTSD (post-traumatic stress disorder) 10/24/2016  . Severe bipolar I disorder, most recent episode depressed without psychotic features ([place]) 10/23/2016  . Ankylosing spondylitis ([place]) 10/23/2016  . Sacroiliac inflammation ([place]) 08/02/2016  . Hypertension 05/28/2016  . Vitamin D deficiency 05/02/2016  . 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 Hypothyroidism 02/13/2016  . Anxiety and depression 01/23/2016  . Status post tubal ligation 09/22/2015  . Morbid (severe) obesity due to excess calories ([place]) 04/25/2015  . Anemia, iron deficiency 01/04/2015  . Headache, migraine 01/04/2015  . Abnormal cytological findings in female genital organs 07/04/2011    Past Surgical History:  Procedure Laterality Date  . CHOLECYSTECTOMY    . LAPAROSCOPIC GASTRIC SLEEVE RESECTION  11/19/2018  . LAPAROSCOPIC TUBAL LIGATION Bilateral 09/19/2015   Procedure: LAPAROSCOPIC BILATERAL TUBAL BANDING;  Surgeon: Brayton Mars, MD;  Location: [facility] ORS;  Service: Gynecology;  Laterality: Bilateral;    Prior to Admission medications   Medication Sig Start Date End Date Taking? Authorizing Provider  cefTRIAXone 1 g in sodium chloride 0.9 % 100 mL Inject 1 g into the vein daily. 01/13/19   Gladstone Lighter, MD  esomeprazole (NEXIUM) 40 MG capsule Take 40 mg by mouth daily.  12/25/18 06/23/19  [provider]  levothyroxine (SYNTHROID, LEVOTHROID) 25 MCG tablet Take 1 tablet (25 mcg total) by mouth daily before breakfast. 05/08/18   Karamalegos, Devonne Doughty, DO  metoCLOPramide (REGLAN) 5 MG/ML  injection Inject 2 mLs (10 mg total) into the vein every 6 (six) hours. 01/12/19   Gladstone Lighter, MD  propranolol ER (INDERAL LA) 60 MG 24 hr capsule Take 1 capsule (60 mg total) by mouth daily. Patient not taking: Reported on 01/08/2019 07/10/18    Deboraha Sprang, MD  scopolamine (TRANSDERM-SCOP) 1 MG/3DAYS Place 1 patch (1.5 mg total) onto the skin every 3 (three) days. For motion sickness / dizziness 01/08/19   Olin Hauser, DO  sucralfate (CARAFATE) 1 GM/10ML suspension Take 1 g by mouth 4 (four) times daily.  12/25/18 01/24/19  [provider]    No Known Allergies  Family History  Problem Relation Age of Onset  . Diabetes Maternal Grandmother   . Heart disease Maternal Grandmother   . Heart failure Maternal Grandmother   . Heart attack Maternal Grandmother   . Rheum arthritis Mother   . Healthy Father   . Heart Problems Maternal Grandfather   . Atrial fibrillation Paternal Grandmother   . Cancer Neg Hx     Social History Social History   Tobacco Use  . Smoking status: Never Smoker  . Smokeless tobacco: Never Used  Substance Use Topics  . Alcohol use: Not Currently    Alcohol/week: 0.0 standard drinks  . Drug use: No    Review of Systems Constitutional: Negative for fever. Eyes: Blurred vision x1 week ENT: Negative for recent illness/congestion Cardiovascular: Negative for chest pain. Respiratory: Negative for shortness of breath. Gastrointestinal: Negative for abdominal pain.  Positive for intermittent nausea and vomiting since June. Genitourinary: Negative for urinary compaints Musculoskeletal: Negative for musculoskeletal complaints Skin: Negative for skin complaints  Neurological: Negative for headache All other ROS negative  ____________________________________________   PHYSICAL EXAM:  VITAL SIGNS: ED Triage Vitals  Enc Vitals Group     BP 01/15/19 1706 (!) 143/101     Pulse Rate 01/15/19 1706 (!) 121     Resp 01/15/19 1706 (!) 21     Temp 01/15/19 1706 98.6 F (37 C)     Temp Source 01/15/19 1706 Oral     SpO2 01/15/19 1706 97 %     Weight --      Height --      Head Circumference --      Peak Flow --      Pain Score 01/15/19 1708 0     Pain Loc --      Pain Edu? --       Excl. in [place]? --    Constitutional: Alert and oriented. Well appearing and in no distress. Eyes: Bilateral dilation (status post eye exam) ENT      Head: Normocephalic and atraumatic      Mouth/Throat: Mucous membranes are moist. Cardiovascular: Normal rate, regular rhythm.  Respiratory: Normal respiratory effort without tachypnea nor retractions. Breath sounds are clear Gastrointestinal: Soft and nontender. No distention.   Musculoskeletal: Nontender with normal range of motion in all extremities Neurologic:  Normal speech and language. No gross focal neurologic deficits  Skin:  Skin is warm, dry and intact.  Psychiatric: Mood and affect are normal.  ____________________________________________    EKG  EKG viewed and interpreted by myself shows sinus tachycardia 102 bpm with a narrow QRS, normal axis, normal intervals, nonspecific ST changes.  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 Otherwise negative.   ____________________________________________   INITIAL IMPRESSION / ASSESSMENT AND PLAN /  ED COURSE  Pertinent labs & imaging results that were available during my care of the patient were reviewed by me and considered in my medical decision making (see chart for details).   Patient presents emergency department referred by Dr. George Ina for evaluation of ophthalmoplegia possibly due to Warnicke's encephalopathy per Dr. George Ina.  Overall the patient appears well, no acute distress.  States her nausea and vomiting have significantly improved since being discharged yesterday from [facility].  We will check labs, obtain an MRI of the brain to help rule out CVA/mass/tumor, continue to closely monitor while awaiting results.  We will dose thiamine and folate as a precaution while awaiting results.  MRI shows restricted diffusion  in part of the corpus callosum consistent with acute or subacute infarct.  We will admit the patient to the hospital service for further work-up and neurology input.     NIH Stroke Scale   Interval: Baseline Time: 9:27 PM Person Administering Scale: Harvest Dark  Administer stroke scale items in the order listed. Record performance in each category after each subscale exam. Do not go back and change scores. Follow directions provided for each exam technique. Scores should reflect what the patient does, not what the clinician thinks the patient can do. The clinician should record answers while administering the exam and work quickly. Except where indicated, the patient should not be coached (i.e., repeated requests to patient to make a special effort).   1a  Level of consciousness: 0=alert; keenly responsive  1b. LOC questions:  0=Performs both tasks correctly  1c. LOC commands: 0=Performs both tasks correctly  2.  Best Gaze: 1=partial gaze palsy  3.  Visual: 0=No visual loss  4. Facial Palsy: 0=Normal symmetric movement  5a.  Motor left arm: 0=No drift, limb holds 90 (or 45) degrees for full 10 seconds  5b.  Motor right arm: 0=No drift, limb holds 90 (or 45) degrees for full 10 seconds  6a. motor left leg: 0=No drift, limb holds 90 (or 45) degrees for full 10 seconds  6b  Motor right leg:  0=No drift, limb holds 90 (or 45) degrees for full 10 seconds  7. Limb Ataxia: 0=Absent  8.  Sensory: 0=Normal; no sensory loss  9. Best Language:  0=No aphasia, normal  10. Dysarthria: 0=Normal  11. Extinction and Inattention: 0=No abnormality  12. Distal motor function: 0=Normal   Total:   [place] was evaluated in Emergency Department on 01/15/2019 for the symptoms described in the history of present illness. She was evaluated in the context of the global COVID-19 pandemic, which necessitated consideration that the patient might be at risk for infection with the SARS-CoV-2  virus that causes COVID-19. Institutional protocols and algorithms that pertain to the evaluation of patients at risk for COVID-19 are in a state of rapid change based on information released by regulatory bodies including the CDC and federal and state organizations. These policies and algorithms were followed during the patient's care in the ED.  ____________________________________________   FINAL CLINICAL IMPRESSION(S) / ED DIAGNOSES  Ophthalmoplegia CVA   Harvest Dark, MD 01/15/19 2128

## 2019-01-15 NOTE — ED Notes (Signed)
Patient transported to MRI 

## 2019-01-15 NOTE — ED Notes (Signed)
This RN called MRI. Per MRI tech they will be ready within "15 min".

## 2019-01-15 NOTE — ED Notes (Signed)
ED TO INPATIENT HANDOFF REPORT  ED Nurse Name and Phone #: [phone]  S Name/Age/Gender Leah Osborne 33 y.o. female Room/Bed: ED07A/ED07A  Code Status   Code Status: Prior  Home/SNF/Other Home A/Ox4 Is this baseline? Yes   Triage Complete: Triage complete  Chief Complaint Referred by [facility]/double vision  Triage Note Pt referred from [facility] care for double vision that was determined unrelated to eyes. Pt does not recall what the eye doctor said he felt was wrong. Pt recently post-gastric sleeve in June 2020. Pt discharged from [facility] yesterday. Pt has had double vision x 8 days. Pt c/o intermittent headaches. Pt c/o nausea and vomiting x 1 today.  Wernicke's encephalopathy is what the pt states her eye doctor suggested was responsible for her problems today.   Allergies No Known Allergies  Level of Care/Admitting Diagnosis ED Disposition    ED Disposition Condition [facility] Area: [place] [[id number]]  Level of Care: Med-Surg [16]  Covid Evaluation: Confirmed COVID Negative  Diagnosis: Double vision [[id number]]  Admitting Physician: Dustin Flock [[id number]]  Attending Physician: Dustin Flock [[id number]]  Estimated length of stay: past midnight tomorrow  Certification:: I certify this patient will need inpatient services for at least 2 midnights  PT Class (Do Not Modify): Inpatient [101]  PT Acc Code (Do Not Modify): Private [1]       B Medical/Surgery History Past Medical History:  Diagnosis Date  . Abnormal Pap smear of cervix   . Arthritis   . Arthritis, rheumatoid ([place])   . Bipolar 1 disorder ([place])   . Chest pain   . Headache    MIGRAINES  . Hyperlipidemia   . Hypertension   . Palpitations    a. 09/2017 Zio Monitor: predominantly RSR, avg HR 87, 11 A tach/SVT episodes, fastest 176 x 7 beats, longest 9 beats. Rare PAC's, occas PVC's, rare couplets/triplets.  Marland Kitchen PTSD (post-traumatic stress disorder)   .  PVC (premature ventricular contraction)   . Social phobia    Past Surgical History:  Procedure Laterality Date  . CHOLECYSTECTOMY    . LAPAROSCOPIC GASTRIC SLEEVE RESECTION  11/19/2018  . LAPAROSCOPIC TUBAL LIGATION Bilateral 09/19/2015   Procedure: LAPAROSCOPIC BILATERAL TUBAL BANDING;  Surgeon: Brayton Mars, MD;  Location: [facility] ORS;  Service: Gynecology;  Laterality: Bilateral;     A IV Location/Drains/Wounds Patient Lines/Drains/Airways Status   Active Line/Drains/Airways    Name:   Placement date:   Placement time:   Site:   Days:   Peripheral IV 01/15/19 Right Antecubital   01/15/19    1724    Antecubital   less than 1          Intake/Output Last 24 hours No intake or output data in the 24 hours ending 01/15/19 2216  Labs/Imaging Results for orders placed or performed during the hospital encounter of 01/15/19 (from the past 48 hour(s))  CBC     Status: Abnormal   Collection Time: 01/15/19  5:21 PM  Result Value Ref Range   WBC 11.4 (H) 4.0 - 10.5 K/uL   RBC 5.80 (H) 3.87 - 5.11 MIL/uL   Hemoglobin 16.5 (H) 12.0 - 15.0 g/dL   HCT 49.5 (H) 36.0 - 46.0 %   MCV 85.3 80.0 - 100.0 fL   MCH 28.4 26.0 - 34.0 pg   MCHC 33.3 30.0 - 36.0 g/dL   RDW 15.1 11.5 - 15.5 %   Platelets 376 150 - 400 K/uL   nRBC  0.0 0.0 - 0.2 %    Comment: Performed at [facility], [address] [place], [place] [address]  Comprehensive metabolic panel     Status: Abnormal   Collection Time: 01/15/19  5:21 PM  Result Value Ref Range   Sodium 137 135 - 145 mmol/L   Potassium 3.1 (L) 3.5 - 5.1 mmol/L   Chloride 97 (L) 98 - 111 mmol/L   CO2 24 22 - 32 mmol/L   Glucose, Bld 91 70 - 99 mg/dL   BUN 6 6 - 20 mg/dL   Creatinine, Ser 0.68 0.44 - 1.00 mg/dL   Calcium 9.4 8.9 - 10.3 mg/dL   Total Protein 7.1 6.5 - 8.1 g/dL   Albumin 3.9 3.5 - 5.0 g/dL   AST 62 (H) 15 - 41 U/L   ALT 67 (H) 0 - 44 U/L   Alkaline Phosphatase 87 38 - 126 U/L   Total Bilirubin 1.6 (H) 0.3 - 1.2 mg/dL    GFR calc non Af Amer >60 >60 mL/min   GFR calc Af Amer >60 >60 mL/min   Anion gap 16 (H) 5 - 15    Comment: Performed at [facility], [address], [place], [place] [address]  Magnesium     Status: None   Collection Time: 01/15/19  5:21 PM  Result Value Ref Range   Magnesium 1.8 1.7 - 2.4 mg/dL    Comment: Performed at [facility], [address], [place], [place] [address]   Mr Brain Wo Contrast  Result Date: 01/15/2019 CLINICAL DATA:  Diplopia.  Double vision 8 days. EXAM: MRI HEAD WITHOUT CONTRAST TECHNIQUE: Multiplanar, multiecho pulse sequences of the brain and surrounding structures were obtained without intravenous contrast. COMPARISON:  None. FINDINGS: Brain: Small area of diffusion weighted hyperintensity in the splenium of the corpus callosum. This shows low signal ADC consistent with restricted diffusion. This may be a subacute infarction. No other areas of restricted diffusion. Ventricle size normal. No other areas of infarction. Negative for hemorrhage or mass. No midline shift. Vascular: Normal arterial flow voids. 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 Otherwise negative. Electronically Signed   By: Franchot Gallo M.D.   On: 01/15/2019 20:43    Pending Labs Unresulted Labs (From admission, onward)    Start     Ordered   01/16/19 0500  Lipid panel  Tomorrow morning,   STAT     01/15/19 2154   01/15/19 1822  Vitamin B1  Add-on,   AD     01/15/19 1821   Signed and Held  CBC  (enoxaparin (LOVENOX)    CrCl >/= 30 ml/min)  Once,   R    Comments: Baseline for enoxaparin therapy IF NOT ALREADY DRAWN.  Notify MD if PLT < 100 K.    Signed and Held   Signed and Held  Creatinine, serum  (enoxaparin (LOVENOX)    CrCl >/= 30 ml/min)  Once,   R    Comments: Baseline for enoxaparin therapy IF NOT ALREADY  DRAWN.    Signed and Held   Signed and Held  Creatinine, serum  (enoxaparin (LOVENOX)    CrCl >/= 30 ml/min)  Weekly,   R    Comments: while on enoxaparin therapy    Signed and Held   Signed and Held  TSH  Once,   R     Signed and  Held   Signed and Held  CBC  Tomorrow morning,   R     Signed and Held   Signed and Held  Basic metabolic panel  Tomorrow morning,   R     Signed and Held          Vitals/Pain Today's Vitals   01/15/19 1930 01/15/19 2032 01/15/19 2100 01/15/19 2115  BP: (!) 133/99 123/87 (!) 127/99   Pulse:  100  (!) 107  Resp: (!) 22 12 20 17   Temp:      TempSrc:      SpO2:  99%  98%  PainSc:        Isolation Precautions No active isolations  Medications Medications  aspirin EC tablet 325 mg (has no administration in time range)  thiamine 500mg  in normal saline (56ml) IVPB (has no administration in time range)  atorvastatin (LIPITOR) tablet 40 mg (has no administration in time range)  desipramine (NORPRAMIN) tablet 12.5 mg (has no administration in time range)  haloperidol (HALDOL) tablet 2 mg (has no administration in time range)  OLANZapine zydis (ZYPREXA) disintegrating tablet 2.5 mg (has no administration in time range)  levothyroxine (SYNTHROID) tablet 25 mcg (has no administration in time range)  pantoprazole (PROTONIX) EC tablet 40 mg (has no administration in time range)  promethazine (PHENERGAN) tablet 12.5 mg (has no administration in time range)  spironolactone (ALDACTONE) tablet 50 mg (has no administration in time range)  potassium chloride 10 mEq in 100 mL IVPB (has no administration in time range)  thiamine (B-1) injection 100 mg (100 mg Intravenous Given 6/38/17 7116)  folic acid 1 mg in sodium chloride 0.9 % 50 mL IVPB (0 mg Intravenous Stopped 01/15/19 1926)  LORazepam (ATIVAN) injection 1 mg (1 mg Intravenous Given 01/15/19 1927)    Mobility walks Low fall risk     R Recommendations: See Admitting Provider Note  Report given to:    Additional Notes:

## 2019-01-15 NOTE — ED Notes (Signed)
ED Provider at bedside. 

## 2019-01-15 NOTE — ED Notes (Signed)
Pt mother at bedside and has questions about medications and treatment decisions, wanting to speak to MD.  MD notified.  Pt not wanting medications at this time until able to speak to MD.

## 2019-01-15 NOTE — ED Triage Notes (Signed)
Pt referred from [facility] care for double vision that was determined unrelated to eyes. Pt does not recall what the eye doctor said he felt was wrong. Pt recently post-gastric sleeve in June 2020. Pt discharged from [facility] yesterday. Pt has had double vision x 8 days. Pt c/o intermittent headaches. Pt c/o nausea and vomiting x 1 today.

## 2019-01-15 NOTE — ED Triage Notes (Addendum)
Wernicke's encephalopathy is what the pt states her eye doctor suggested was responsible for her problems today.

## 2019-01-15 NOTE — ED Notes (Signed)
ED Provider, Paduchowski  at bedside. 

## 2019-01-15 NOTE — H&P (Signed)
[place] [facility] [place] NAME: Leah Osborne    MR#:  [id number]  DATE OF BIRTH:  02-10-1986  DATE OF ADMISSION:  01/15/2019  PRIMARY CARE PHYSICIAN: Olin Hauser, DO   REQUESTING/REFERRING PHYSICIAN: Harvest Dark, MD  CHIEF COMPLAINT:   Chief Complaint  Patient presents with  . Diplopia    HISTORY OF PRESENT ILLNESS: Leah Osborne  is a 33 y.o. female 33 year old female with PMHx of morbid obesity, hypertension, IBS-mixed, bipolar disorder, PTSD, depression, and recent robotic gastric sleeve (11/19/2018) was transferred from [facility] for concern of nausea/vomiting x6 weeks, and 2 weeks of dizziness, tinnitus, and vision changes.  Patient was initially hospitalized at [facility] and was in the hospital for 4 days.  Then subsequently transferred to [facility].  Patient was discharged from [facility] yesterday there she complained of double vision.  They told her that she needed to follow-up with her ophthalmologist.  Patient was seen by Dr. 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  Sent the patient to the ED today for further evaluation, concern for Wernicke's encephalopathy given her prolonged malnutrition since her gastric sleeve. Patient complains of double vision.  Also states that she has had an unsteady gait.  MRI in the ER showed a possible small subacute CVA.  She states that with the current regimen given from [facility] her nausea is improved.    PAST MEDICAL HISTORY:   Past Medical History:  Diagnosis Date  . Abnormal Pap smear of cervix   . Arthritis   . Arthritis, rheumatoid ([place])   . Bipolar 1 disorder ([place])   . Chest pain   . Headache    MIGRAINES  . Hyperlipidemia   . Hypertension   . Palpitations    a. 09/2017 Zio Monitor: predominantly RSR, avg HR 87, 11 A tach/SVT episodes, fastest 176 x 7 beats, longest 9 beats. Rare PAC's, occas PVC's, rare couplets/triplets.  Marland Kitchen PTSD  (post-traumatic stress disorder)   . PVC (premature ventricular contraction)   . Social phobia     PAST SURGICAL HISTORY:  Past Surgical History:  Procedure Laterality Date  . CHOLECYSTECTOMY    . LAPAROSCOPIC GASTRIC SLEEVE RESECTION  11/19/2018  . LAPAROSCOPIC TUBAL LIGATION Bilateral 09/19/2015   Procedure: LAPAROSCOPIC BILATERAL TUBAL BANDING;  Surgeon: Brayton Mars, MD;  Location: [facility] ORS;  Service: Gynecology;  Laterality: Bilateral;    SOCIAL HISTORY:  Social History   Tobacco Use  . Smoking status: Never Smoker  . Smokeless tobacco: Never Used  Substance Use Topics  . Alcohol use: Not Currently    Alcohol/week: 0.0 standard drinks    FAMILY HISTORY:  Family History  Problem Relation Age of Onset  . Diabetes Maternal Grandmother   . Heart disease Maternal Grandmother   . Heart failure Maternal Grandmother   . Heart attack Maternal Grandmother   . Rheum arthritis Mother   . Healthy Father   . Heart Problems Maternal Grandfather   . Atrial fibrillation Paternal Grandmother   . Cancer Neg Hx     DRUG ALLERGIES: No Known Allergies  REVIEW OF SYSTEMS:   CONSTITUTIONAL: No fever, fatigue or weakness.  EYES: No blurred or double vision.  EARS, NOSE, AND THROAT: No tinnitus or ear pain.  RESPIRATORY: No cough, shortness of breath, wheezing or hemoptysis.  CARDIOVASCULAR: No chest pain, orthopnea, edema.  GASTROINTESTINAL: Positive nausea, vomiting, diarrhea or abdominal pain.  GENITOURINARY: No dysuria, hematuria.  ENDOCRINE: No polyuria, nocturia,  HEMATOLOGY: No  anemia, easy bruising or bleeding SKIN: No rash or lesion. MUSCULOSKELETAL: No joint pain or arthritis.   NEUROLOGIC: No tingling, numbness, weakness.  PSYCHIATRY: No anxiety or depression.   MEDICATIONS AT HOME:  Prior to Admission medications   Medication Sig Start Date End Date Taking? Authorizing Provider  esomeprazole (NEXIUM) 40 MG capsule Take 40 mg by mouth daily.  12/25/18 06/23/19 Yes  [provider]  levothyroxine (SYNTHROID, LEVOTHROID) 25 MCG tablet Take 1 tablet (25 mcg total) by mouth daily before breakfast. 05/08/18  Yes Karamalegos, Devonne Doughty, DO  propranolol ER (INDERAL LA) 60 MG 24 hr capsule Take 1 capsule (60 mg total) by mouth daily. 07/10/18  Yes Deboraha Sprang, MD      PHYSICAL EXAMINATION:   VITAL SIGNS: Blood pressure (!) 127/99, pulse (!) 107, temperature 98.2 F (36.8 C), temperature source Oral, resp. rate 17, SpO2 98 %.  GENERAL:  33 y.o.-year-old patient lying in the bed with no acute distress.  EYES: Pupils equal, round, reactive to light and accommodation. No scleral icterus. Extraocular muscles intact.  HEENT: Head atraumatic, normocephalic. Oropharynx and nasopharynx clear.  NECK:  Supple, no jugular venous distention. No thyroid enlargement, no tenderness.  LUNGS: Normal breath sounds bilaterally, no wheezing, rales,rhonchi or crepitation. No use of accessory muscles of respiration.  CARDIOVASCULAR: S1, S2 normal. No murmurs, rubs, or gallops.  ABDOMEN: Soft, nontender, nondistended. Bowel sounds present. No organomegaly or mass.  EXTREMITIES: No pedal edema, cyanosis, or clubbing.  NEUROLOGIC: Cranial nerves II through XII are intact. Muscle strength 5/5 in all extremities. Sensation intact. Gait not checked.  PSYCHIATRIC: The patient is alert and oriented x 3.  SKIN: No obvious rash, lesion, or ulcer.   LABORATORY PANEL:   CBC Recent Labs  Lab 01/10/19 1935 01/11/19 0416 01/15/19 1721  WBC 10.3 10.2 11.4*  HGB 16.6* 14.4 16.5*  HCT 49.5* 43.6 49.5*  PLT 378 307 376  MCV 84.2 84.2 85.3  MCH 28.2 27.8 28.4  MCHC 33.5 33.0 33.3  RDW 15.1 14.7 15.1   ------------------------------------------------------------------------------------------------------------------  Chemistries  Recent Labs  Lab 01/10/19 1935 01/11/19 0416 01/11/19 1459 01/12/19 0409 01/15/19 1721  NA 138 141  --  142 137  K 3.2* 3.1*  --  3.2*  3.1*  CL 97* 104  --  106 97*  CO2 23 27  --  25 24  GLUCOSE 91 103*  --  79 91  BUN 9 7  --  <5* 6  CREATININE 0.71 0.57  --  0.53 0.68  CALCIUM 9.1 8.3*  --  8.3* 9.4  MG  --   --  1.9  --  1.8  AST 30  --   --   --  62*  ALT 28  --   --   --  67*  ALKPHOS 71  --   --   --  87  BILITOT 1.5*  --   --   --  1.6*   ------------------------------------------------------------------------------------------------------------------ estimated creatinine clearance is 122.6 mL/min (by C-G formula based on SCr of 0.68 mg/dL). ------------------------------------------------------------------------------------------------------------------ No results for input(s): TSH, T4TOTAL, T3FREE, THYROIDAB in the last 72 hours.  Invalid input(s): FREET3   Coagulation profile No results for input(s): INR, PROTIME in the last 168 hours. ------------------------------------------------------------------------------------------------------------------- No results for input(s): DDIMER in the last 72 hours. -------------------------------------------------------------------------------------------------------------------  Cardiac Enzymes No results for input(s): CKMB, TROPONINI, MYOGLOBIN in the last 168 hours.  Invalid input(s): CK ------------------------------------------------------------------------------------------------------------------ Invalid input(s): POCBNP  ---------------------------------------------------------------------------------------------------------------  Urinalysis    Component  Value Date/Time   COLORURINE AMBER (A) 01/10/2019 2243   APPEARANCEUR CLOUDY (A) 01/10/2019 2243   LABSPEC 1.028 01/10/2019 2243   PHURINE 6.0 01/10/2019 2243   GLUCOSEU NEGATIVE 01/10/2019 2243   HGBUR MODERATE (A) 01/10/2019 2243   BILIRUBINUR MODERATE (A) 01/10/2019 2243   BILIRUBINUR negative 10/14/2018 1413   KETONESUR 80 (A) 01/10/2019 2243   PROTEINUR 100 (A) 01/10/2019 2243    UROBILINOGEN 0.2 10/14/2018 1413   NITRITE NEGATIVE 01/10/2019 2243   LEUKOCYTESUR SMALL (A) 01/10/2019 2243     RADIOLOGY: Mr Brain Wo Contrast  Result Date: 01/15/2019 CLINICAL DATA:  Diplopia.  Double vision 8 days. EXAM: MRI HEAD WITHOUT CONTRAST TECHNIQUE: Multiplanar, multiecho pulse sequences of the brain and surrounding structures were obtained without intravenous contrast. COMPARISON:  None. FINDINGS: Brain: Small area of diffusion weighted hyperintensity in the splenium of the corpus callosum. This shows low signal ADC consistent with restricted diffusion. This may be a subacute infarction. No other areas of restricted diffusion. Ventricle size normal. No other areas of infarction. Negative for hemorrhage or mass. No midline shift. Vascular: Normal arterial flow voids. 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 Otherwise negative. Electronically Signed   By: Franchot Gallo M.D.   On: 01/15/2019 20:43    EKG: Orders placed or performed during the hospital encounter of 01/15/19  . ED EKG  . ED EKG  . EKG 12-Lead  . EKG 12-Lead    IMPRESSION AND PLAN: Patient is 33 year old with multiple medical problems presenting with double vision  1.  Double vision and cranial nerve plegia affecting her vision per ophthalmology they feel that she has Wernicke's at this point I will treat patient with IV thiamine 500 mg IV twice daily for 2 days.  2.  Subacute small stroke we will treat with aspirin I will asked neurology to see Place her on aspirin Supportive care  3.  Hypokalemia we will replace potassium with her IV fluids   4.  Nausea vomiting related to her recent gastric sleeve will continue current regimen as doing at home   5.  Bipolar disorder with anxiety continue her regimen as she was discharged from [facility]  6.   Hypothyroidism continue Synthroid  All the records are reviewed and case discussed with ED provider. Management plans discussed with the patient, family and they are in agreement.  CODE STATUS: Code Status History    Date Active Date Inactive Code Status Order ID Comments User Context   01/11/2019 0110 01/12/2019 2032 Full Code [id number]  Lance Coon, MD Inpatient   01/01/2019 1642 01/02/2019 2012 Full Code [id number]  Saundra Shelling, MD ED   10/23/2016 1446 10/24/2016 1607 Full Code [id number]  Clovis Fredrickson, MD Inpatient   10/22/2016 1708 10/23/2016 1312 Full Code [id number]  Recardo Evangelist, PA-C ED   Advance Care Planning Activity       TOTAL TIME TAKING CARE OF THIS PATIENT: 55 minutes.    Dustin Flock M.D on 01/15/2019 at 9:40 PM  Between 7am to 6pm - Pager - [phone]  After 6pm go to [email or web address] - Proofreader  [employer] Office  [phone]  CC: Primary care physician; Olin Hauser, DO

## 2019-01-15 NOTE — ED Notes (Signed)
Pt back from MRI to room

## 2019-01-15 NOTE — Unmapped (Addendum)
33 year old female with PMHx of morbid obesity, hypertension, IBS???mixed, bipolar disorder, PTSD, depression, and recent robotic gastric sleeve (11/19/2018) was transferred from [facility] for concern of nausea/vomiting x6 weeks, and 2 weeks of dizziness, tinnitus, and vision changes.  Patient was admitted for work-up and fluid resuscitation.  Patient was placed n.p.o. at midnight for potential GI consult in the morning.  She was maintained on IV fluids.  No acute events occurred overnight.  Patient was noted to tolerate a full liquid diet and consumed an entire tray.  GI consulted: Assessment felt it was unlikely that she developed a new structural cause of her symptoms.  Recommended outpatient fiber scan and lifestyle weight loss.  Recommended to discontinue mirtazapine and start desipramine.  They also recommended GI follow-up outpatient.  Psych was also consulted who recommended the first line Zyprexa twice daily as needed, 2nd line Haldol twice daily as needed and 3rd line lorazepam twice daily as needed.  The recommendation was also to hold the Lamictal at this time.  It was noted that zyprexa had helped.  At the time of her discharge, patient was able to tolerate her bariatric diet.  We will maintain the recommendations of the GI and psychiatric consult.  These medications were sent to her pharmacy.  Discharge instructions were given detailing the start of her new medications as well as the discontinuance of others (Lamictal, Zofran, mirtazapine).  At the time of discharge, patient is able to tolerate a bariatric diet, ambulate independently, void without complications.  She had no episodes of emesis during her hospitalization.  Contact was made with GI to proceed with outpatient appointment.  A referral was also placed for neurology consult regarding her dizziness, tinnitus and vision changes.  This was explained to the patient.  She is agreeable to plan.

## 2019-01-16 ENCOUNTER — Inpatient Hospital Stay (HOSPITAL_COMMUNITY)
Admit: 2019-01-16 | Discharge: 2019-01-16 | Disposition: A | Discharge status: Home / Self Care | Payer: BC Managed Care – PPO | Attending: Internal Medicine | Admitting: Internal Medicine

## 2019-01-16 ENCOUNTER — Encounter: Payer: Self-pay | Admitting: Nurse Practitioner

## 2019-01-16 ENCOUNTER — Inpatient Hospital Stay: Payer: BC Managed Care – PPO

## 2019-01-16 DIAGNOSIS — R000 Tachycardia, unspecified: Secondary | ICD-10-CM

## 2019-01-16 DIAGNOSIS — H532 Diplopia: Secondary | ICD-10-CM

## 2019-01-16 DIAGNOSIS — R112 Nausea with vomiting, unspecified: Secondary | ICD-10-CM

## 2019-01-16 DIAGNOSIS — I639 Cerebral infarction, unspecified: Secondary | ICD-10-CM

## 2019-01-16 DIAGNOSIS — I479 Paroxysmal tachycardia, unspecified: Secondary | ICD-10-CM

## 2019-01-16 DIAGNOSIS — Y832 Surgical operation with anastomosis, bypass or graft as the cause of abnormal reaction of the patient, or of later complication, without mention of misadventure at the time of the procedure: Secondary | ICD-10-CM

## 2019-01-16 DIAGNOSIS — K9189 Other postprocedural complications and disorders of digestive system: Secondary | ICD-10-CM

## 2019-01-16 LAB — LIPID PANEL
Cholesterol: 108 mg/dL (ref 0–200)
HDL: 21 mg/dL — ABNORMAL LOW (ref >40)
LDL Cholesterol: 70 mg/dL (ref 0–99)
Total CHOL/HDL Ratio: 5.1 RATIO
Triglycerides: 84 mg/dL (ref <150)
VLDL: 17 mg/dL (ref 0–40)

## 2019-01-16 LAB — BASIC METABOLIC PANEL
Anion gap: 11 (ref 5–15)
BUN: 8 mg/dL (ref 6–20)
CO2: 26 mmol/L (ref 22–32)
Calcium: 8.6 mg/dL — ABNORMAL LOW (ref 8.9–10.3)
Chloride: 101 mmol/L (ref 98–111)
Creatinine, Ser: 0.54 mg/dL (ref 0.44–1.00)
GFR calc Af Amer: >60 mL/min (ref >60)
GFR calc non Af Amer: >60 mL/min (ref >60)
Glucose, Bld: 88 mg/dL (ref 70–99)
Potassium: 2.9 mmol/L — ABNORMAL LOW (ref 3.5–5.1)
Sodium: 138 mmol/L (ref 135–145)

## 2019-01-16 LAB — CBC
HCT: 41 % (ref 36.0–46.0)
Hemoglobin: 13.9 g/dL (ref 12.0–15.0)
MCH: 28.5 pg (ref 26.0–34.0)
MCHC: 33.9 g/dL (ref 30.0–36.0)
MCV: 84 fL (ref 80.0–100.0)
Platelets: 296 10*3/uL (ref 150–400)
RBC: 4.88 MIL/uL (ref 3.87–5.11)
RDW: 15.1 % (ref 11.5–15.5)
WBC: 10.4 10*3/uL (ref 4.0–10.5)
nRBC: 0 % (ref 0.0–0.2)

## 2019-01-16 LAB — ECHOCARDIOGRAM COMPLETE
Height: 61 in
Weight: 4123.48 oz

## 2019-01-16 LAB — SARS CORONAVIRUS 2 BY RT PCR (HOSPITAL ORDER, PERFORMED IN ~~LOC~~ HOSPITAL LAB): SARS Coronavirus 2: NEGATIVE

## 2019-01-16 MED ORDER — POTASSIUM CHLORIDE 20 MEQ PO PACK
40.0000 meq | PACK | Freq: Once | ORAL | Status: AC
  Administered 2019-01-16: 13:00:00 40 meq via ORAL
  Filled 2019-01-16: qty 2

## 2019-01-16 MED ORDER — VITAMIN B-1 100 MG PO TABS
100.0000 mg | ORAL_TABLET | Freq: Every day | ORAL | Status: DC
  Administered 2019-01-17 – 2019-01-18 (×2): 100 mg via ORAL
  Filled 2019-01-16 (×2): qty 1

## 2019-01-16 MED ORDER — HALOPERIDOL 2 MG PO TABS
2.0000 mg | ORAL_TABLET | Freq: Two times a day (BID) | ORAL | Status: DC | PRN
  Filled 2019-01-16: qty 1

## 2019-01-16 MED ORDER — CYANOCOBALAMIN 500 MCG PO TABS
500.0000 ug | ORAL_TABLET | Freq: Every day | ORAL | Status: DC
  Administered 2019-01-16 – 2019-01-18 (×3): 500 ug via ORAL
  Filled 2019-01-16 (×3): qty 1

## 2019-01-16 MED ORDER — OLANZAPINE 5 MG PO TBDP
2.5000 mg | ORAL_TABLET | Freq: Two times a day (BID) | ORAL | Status: DC
  Administered 2019-01-16 – 2019-01-18 (×4): 2.5 mg via ORAL
  Filled 2019-01-16 (×5): qty 0.5

## 2019-01-16 MED ORDER — PROPRANOLOL HCL ER 60 MG PO CP24
60.0000 mg | ORAL_CAPSULE | Freq: Every day | ORAL | Status: DC
  Administered 2019-01-16 – 2019-01-18 (×3): 60 mg via ORAL
  Filled 2019-01-16 (×3): qty 1

## 2019-01-16 MED ORDER — PERFLUTREN LIPID MICROSPHERE
1.0000 mL | INTRAVENOUS | Status: AC | PRN
  Administered 2019-01-16: 2 mL via INTRAVENOUS
  Filled 2019-01-16: qty 10

## 2019-01-16 MED ORDER — GENERIC EXTERNAL MEDICATION
Status: DC
Start: ? — End: 2019-01-16

## 2019-01-16 MED ORDER — POTASSIUM CHLORIDE CRYS ER 20 MEQ PO TBCR
40.0000 meq | EXTENDED_RELEASE_TABLET | ORAL | Status: DC
  Administered 2019-01-16: 40 meq via ORAL
  Filled 2019-01-16: qty 2

## 2019-01-16 NOTE — Progress Notes (Addendum)
Leah Osborne    MR#:  [id number]  DATE OF BIRTH:  02/27/1986  SUBJECTIVE:  CHIEF COMPLAINT:   Chief Complaint  Patient presents with  . Diplopia   -Patient had sleeve gastrectomy procedure done 2 months ago, postprocedure had nausea vomiting intermittently.  Recent admission to our hospital last week and got transferred to [facility].  Treated conservatively and discharged 2 days ago.  Has been having double vision and dizziness and vertigo for a few days now. -Sent in from ophthalmology office.  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  HENT: Negative for congestion, ear discharge, hearing loss and nosebleeds.   Eyes: Positive for blurred vision and double vision.  Respiratory: Negative for cough, shortness of breath and wheezing.   Cardiovascular: Negative for chest pain and palpitations.  Gastrointestinal: Positive for nausea. Negative for abdominal pain, constipation, diarrhea and vomiting.  Genitourinary: Negative for dysuria.  Musculoskeletal: Negative for myalgias.  Neurological: Positive for dizziness. Negative for focal weakness, seizures, weakness and headaches.  Psychiatric/Behavioral: Negative for depression.    DRUG ALLERGIES:  No Known Allergies  VITALS:  Blood pressure (!) 108/95, pulse 85, temperature 98.4 F (36.9 C), temperature source Oral, resp. rate 18, height 5\' 1"  (1.549 m), weight 116.9 kg, SpO2 93 %.  PHYSICAL EXAMINATION:  Physical Exam   GENERAL:  33 y.o.-year-old morbidly obese patient lying in the bed with no acute distress.  EYES: Pupils equal, round, reactive to light and accommodation. No scleral icterus. Extraocular muscles intact. No nystagmus HEENT: Head atraumatic, normocephalic. Oropharynx and nasopharynx clear.  NECK:  Supple, no jugular venous  distention. No thyroid enlargement, no tenderness.  LUNGS: Normal breath sounds bilaterally, no wheezing, rales,rhonchi or crepitation. No use of accessory muscles of respiration.  CARDIOVASCULAR: S1, S2 normal. No murmurs, rubs, or gallops.  ABDOMEN: Soft, nontender, nondistended. Bowel sounds present. No organomegaly or mass.  EXTREMITIES: No pedal edema, cyanosis, or clubbing.  NEUROLOGIC: Cranial nerves II through XII are intact. Muscle strength 5/5 in all extremities. Sensation intact. Gait not checked.  PSYCHIATRIC: The patient is alert and oriented x 3.  SKIN: No obvious rash, lesion, or ulcer.    LABORATORY PANEL:   CBC Recent Labs  Lab 01/16/19 0535  WBC 10.4  HGB 13.9  HCT 41.0  PLT 296   ------------------------------------------------------------------------------------------------------------------  Chemistries  Recent Labs  Lab 01/15/19 1721 01/16/19 0535  NA 137 138  K 3.1* 2.9*  CL 97* 101  CO2 24 26  GLUCOSE 91 88  BUN 6 8  CREATININE 0.68 0.54  CALCIUM 9.4 8.6*  MG 1.8  --   AST 62*  --   ALT 67*  --   ALKPHOS 87  --   BILITOT 1.6*  --    ------------------------------------------------------------------------------------------------------------------  Cardiac Enzymes No results for input(s): TROPONINI in the last 168 hours. ------------------------------------------------------------------------------------------------------------------  RADIOLOGY:  Mr Brain Wo Contrast  Result Date: 01/15/2019 CLINICAL DATA:  Diplopia.  Double vision 8 days. EXAM: MRI HEAD WITHOUT CONTRAST TECHNIQUE: Multiplanar, multiecho pulse sequences of the brain and surrounding structures were obtained without intravenous contrast. COMPARISON:  None. FINDINGS: Brain: Small area of diffusion weighted hyperintensity in the splenium of the corpus callosum. This shows low signal ADC consistent with restricted diffusion. This may be a subacute infarction. No other areas of  restricted diffusion. Ventricle size normal.  No other areas of infarction. Negative for hemorrhage or mass. No midline shift. Vascular: Normal arterial flow voids. 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 Otherwise negative. Electronically Signed   By: Franchot Gallo M.D.   On: 01/15/2019 20:43    EKG:   Orders placed or performed during the hospital encounter of 01/15/19  . ED EKG  . ED EKG  . EKG 12-Lead  . EKG 12-Lead    ASSESSMENT AND PLAN:   33 year old female with past medical history significant for morbid obesity, hypertension, IBS, PTSD, recent sleeve gastrectomy done on 11/19/2018 oh was just discharged from [facility] after symptomatic treatment for her nausea vomiting comes to hospital secondary to worsening blurred vision.  1.  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 -Agree with neurology consult. -IV thiamine for 2 days followed by oral thiamine- concern for wernicke's and malnutrition given recent bariatric surgery and nausea vomiting -Recently received B12 injection last week.  Continue oral supplements -PT/OT consult  2.  Subacute small stroke-on aspirin and statin -PT/OT and neurology consults. -Carotid Dopplers and echo pending  3.  Hypokalemia-secondary to GI losses.  Being replaced  4.  Subacute intractable nausea-secondary to recent gastric sleeve procedure 2 months ago, followed by a postprocedural endoscopy with stricture dilatation 4 weeks ago.  Recent admission to here and also at [facility] and symptomatic treatment of nausea vomiting recommended -Continue Zyprexa that was added for nausea.  5.  GERD-Protonix  6.  DVT prophylaxis-Lovenox   Encourage ambulation.   All the records are reviewed and case discussed with Care Management/Social Workerr. Management plans discussed with the patient, family and they are in agreement.  CODE STATUS: Full Code  TOTAL TIME TAKING CARE OF THIS PATIENT: 38 minutes.   POSSIBLE D/C IN 1-2 DAYS, DEPENDING ON CLINICAL CONDITION.   Gladstone Lighter M.D on 01/16/2019 at 9:05 AM  Between 7am to 6pm - Pager - [phone]  After 6pm go to [email or web address] - password [email or web address] [facility]  Office  [phone]  CC: Primary care physician; Leah Hauser, DO

## 2019-01-16 NOTE — Evaluation (Signed)
Physical Therapy Evaluation Patient Details Name: Leah Osborne MRN: [id number] DOB: 08/21/1985 Today's Date: 01/16/2019   History of Present Illness  33 y.o. female 32 year old female with PMHx of morbid obesity, hypertension, IBS-mixed, bipolar disorder, PTSD, depression, and recent robotic gastric sleeve (11/19/2018) was transferred from [facility] to [facility] for concern of nausea/vomiting x6 weeks, and 2 weeks of dizziness, tinnitus, and vision changes.  Patient was initially hospitalized at [facility] for 4 days.  Patient was discharged from [facility] with ongoing complaints with double vision, pt informed to follow up with her ophthalmologist.  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  Sent the patient to the ED today for further evaluation, concern for Wernicke's encephalopathy given her prolonged malnutrition since her gastric sleeve.  Patient complains of double vision.  Also states that she has had an unsteady gait.  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 Otherwise negative.     Clinical Impression  Patient alert and oriented x4, able to provide clear PLOF; stated previously she was independent in ADLs/IADLs, has three children under the age of 57, lives with her spouse who would be available PRN to assist. Lives in split level home, no AD.  Patient was able to demonstrate bed mobility mod I and sit EOB with good balance. UE and LE strength, coordination, and sensation assessed, WFLs. Pt reported double vision throughout session, improved with one eye closed, difficulty with tracking motion to the L, convergence present. Pt reported dizziness upon standing with handheld assist. PT exhibited swaying, preference for at least unilateral support in standing, and stabilized posteriorly with legs against bed. BP assessment attempted, may have  demonstrated orthostatic hypotension and tachycardia, readings unclear.  Overall the patient demonstrated deficits (see "PT Problem List") that impede the patient's functional abilities, safety, and mobility and would benefit from skilled PT intervention. The patient would also benefit from further assessment of mobility to assist with discharge planning.      Follow Up Recommendations Other (comment)(PT recommendation Pending mobility assessment)    Equipment Recommendations  Other (comment)(TBD pending mobility assessment)    Recommendations for Other Services       Precautions / Restrictions Precautions Precautions: Fall Precaution Comments: BP, HR, double vision Restrictions Weight Bearing Restrictions: No      Mobility  Bed Mobility Overal bed mobility: Modified Independent                Transfers Overall transfer level: Needs assistance Equipment used: None Transfers: Sit to/from Stand Sit to Stand: Min guard         General transfer comment: occasional steadying needed for static balance, uses legs posteriorly to stabilize  Ambulation/Gait             General Gait Details: deferred  Stairs            Wheelchair Mobility    Modified Rankin (Stroke Patients Only)       Balance Overall balance assessment: Needs assistance Sitting-balance support: Feet supported Sitting balance-Leahy Scale: Good     Standing balance support: Single extremity supported Standing balance-Leahy Scale: Poor Standing balance comment: reaches for at least unilateral UE support, uses legs on bed posteriorly to stand                             Pertinent Vitals/Pain Pain Assessment: No/denies pain  Home Living Family/patient expects to be discharged to:: Private residence Living Arrangements: Spouse/significant other;Children Available Help at Discharge: Family;Available PRN/intermittently Type of Home: House       Home Layout: Two  level Home Equipment: Grab bars - toilet;Grab bars - tub/shower      Prior Function Level of Independence: Independent               Hand Dominance   Dominant Hand: Right    Extremity/Trunk Assessment   Upper Extremity Assessment Upper Extremity Assessment: RUE deficits/detail;LUE deficits/detail RUE Deficits / Details: grossly 4/5 RUE Sensation: WNL RUE Coordination: WNL LUE Deficits / Details: grossly 4/5 LUE Sensation: WNL LUE Coordination: WNL    Lower Extremity Assessment Lower Extremity Assessment: RLE deficits/detail;LLE deficits/detail RLE Deficits / Details: grossly 4+/5 RLE Coordination: WNL LLE Deficits / Details: grossly 4+/5 LLE Sensation: WNL LLE Coordination: WNL    Cervical / Trunk Assessment Cervical / Trunk Assessment: Normal  Communication   Communication: No difficulties  Cognition Arousal/Alertness: Awake/alert Behavior During Therapy: WFL for tasks assessed/performed Overall Cognitive Status: Within Functional Limits for tasks assessed                                 General Comments: eyes often closed during session to address dizziness/double vision      General Comments      Exercises     Assessment/Plan    PT Assessment Patient needs continued PT services  PT Problem List Decreased strength;Decreased balance;Pain;Decreased activity tolerance       PT Treatment Interventions DME instruction;Balance training;Gait training;Neuromuscular re-education;Stair training;Functional mobility training;Patient/family education;Therapeutic activities;Therapeutic exercise    PT Goals (Current goals can be found in the Care Plan section)  Acute Rehab PT Goals Patient Stated Goal: To return to PLOF PT Goal Formulation: With patient Time For Goal Achievement: 01/30/19 Potential to Achieve Goals: Good    Frequency 7X/week   Barriers to discharge Decreased caregiver support;Inaccessible home environment       Co-evaluation               AM-PAC PT "6 Clicks" Mobility  Outcome Measure Help needed turning from your back to your side while in a flat bed without using bedrails?: A Little Help needed moving from lying on your back to sitting on the side of a flat bed without using bedrails?: A Little Help needed moving to and from a bed to a chair (including a wheelchair)?: A Lot Help needed standing up from a chair using your arms (e.g., wheelchair or bedside chair)?: A Lot Help needed to walk in hospital room?: A Lot Help needed climbing 3-5 steps with a railing? : Total 6 Click Score: 13    End of Session Equipment Utilized During Treatment: Gait belt Activity Tolerance: Other (comment)(pt limited by dizziness, BP) Patient left: in bed;with call bell/phone within reach;with bed alarm set Nurse Communication: Mobility status PT Visit Diagnosis: Unsteadiness on feet (R26.81);Other abnormalities of gait and mobility (R26.89);Muscle weakness (generalized) (M62.81);Difficulty in walking, not elsewhere classified (R26.2);Dizziness and giddiness (R42)    Time: 1638-4536 PT Time Calculation (min) (ACUTE ONLY): 29 min   Charges:   PT Evaluation $PT Eval Moderate Complexity: 1 Mod PT Treatments $Therapeutic Exercise: 8-22 mins       Lieutenant Diego PT, DPT 1:17 PM,01/16/19 [phone]

## 2019-01-16 NOTE — Consult Note (Signed)
Cardiology Consult    Patient ID: Leah Osborne MRN: [id number], DOB/AGE: 17-Apr-1986   Admit date: 01/15/2019 Date of Consult: 01/16/2019  Primary Physician: Olin Hauser, DO Primary Cardiologist: Ida Rogue, MD/S. Caryl Comes, MD - EP Requesting Provider: Claria Dice, MD  Patient Profile    Leah Osborne is a 33 y.o. female with a history of atypical c/p, HTN, HL, OSA, palpitatons w/ documented PVCs and short runs of atrial tachycardia, who is being seen today for the evaluation of sinus tachycardia in the setting of acute/subacute stroke at the request of Dr. Tressia Miners.  Past Medical History   Past Medical History:  Diagnosis Date   Abnormal Pap smear of cervix    Arthritis    Arthritis, rheumatoid (HCC)    Atypical chest pain    Bipolar 1 disorder (HCC)    Headache    MIGRAINES   Hyperlipidemia    Hypertension    Morbid obesity ([place])    a. 11/2018 s/p Robotic Sleeve Gastrectomy @ [facility].   Palpitations    a. 09/2017 Zio Monitor: predominantly RSR, avg HR 87, 11 A tach/SVT episodes, fastest 176 x 7 beats, longest 9 beats. Rare PAC's, occas PVC's, rare couplets/triplets.   Persistent Nausea and vomiting    a. Following sleeve gastrectomy 11/2018   PTSD (post-traumatic stress disorder)    PVC (premature ventricular contraction)    Social phobia     Past Surgical History:  Procedure Laterality Date   CHOLECYSTECTOMY     LAPAROSCOPIC GASTRIC SLEEVE RESECTION  11/19/2018   LAPAROSCOPIC TUBAL LIGATION Bilateral 09/19/2015   Procedure: LAPAROSCOPIC BILATERAL TUBAL BANDING;  Surgeon: Brayton Mars, MD;  Location: [facility] ORS;  Service: Gynecology;  Laterality: Bilateral;     Allergies  No Known Allergies  History of Present Illness    33 y/o ? with the above PMH including atypical c/p, HTN, HL, palpitations, migraines, OSA, and bipolar d/o.  In 09/2017, she was eval for tachypalpitations w/ a ZIO monitor, which showed symptomatic sinus  tachycardia, brief runs of SVT, and intermittent PVCs, w/ episodes of ventricular bigeminy and trigeminy.  She was placed on metoprolol 25mg  BID and continued to have breatkthrough palpitations.   blocker was titrated to 50mg  BID in 03/2018, but then she noted lower HRs on her home monitor.   blocker was changed to propranalol, which was later titrated to 60mg  daily.  It was felt that any low HRs noted on her apple watch were more likely 2/2 PVCs as ZIO did not show any significant bradycardia.  Palpitations were relatively stable when she was last seen via telemedicine visit in May.  On 6/3, she underwent planned robotic sleeve gastrectomy @ [facility] for weight loss.  Post-op, she has had persistent n/v and has had several admissions here (7/16-7/17; 7/25-7/27) and [facility] (7/27-7/29).  She says that following her most recent [facility] admission, her n/v has finally improved and for the first time in 2 mos, she is able to keep down food and meds.  She says that she more or less hasn't taken her propranolol since her surgery.  Over the past two weeks, she has been complaining of dizziness and blurred/double vision.  She was referred to ophthalmology and saw them on 7/30 w/ concern for bilat opthalmoplegia , possibly in the setting of malnutrition/Wernicke's encephalopathy.  She was referred to the ED and MRI here has shown a small focus of restricted diffusion splenium of the corpus callosum, most consistent w/ acute or subacute infarct.  She  was placed on ASA and admitted.  She has been seen by neuro.  Carotid U/S did not show any significant dzs.  She has been in sinus rhythm to sinus tachycardia throughout admission.  Rates sometimes rise into the 150s.  There was some concern for possible atrial flutter, though upon review of tele, none is present.  She does sometimes note elevated rates, but says that this is mostly after awakening suddenly and feeling startled.  Propranolol was resumed this morning.  She continues to  have diplopia, but she denies chest pain, dyspnea, pnd, orthopnea, n, v, dizziness, syncope, edema, weight gain, or early satiety.  She notes that during the act of vomiting over the past 2 mos, her chest has hurt, but not currently.  Inpatient Medications     aspirin EC  325 mg Oral Daily   atorvastatin  40 mg Oral q1800   enoxaparin (LOVENOX) injection  40 mg Subcutaneous BID   imipramine  25 mg Oral QHS   levothyroxine  25 mcg Oral QAC breakfast   OLANZapine zydis  2.5 mg Oral BID   pantoprazole  40 mg Oral Daily   propranolol ER  60 mg Oral Daily   spironolactone  50 mg Oral Daily   [START ON 01/17/2019] thiamine  100 mg Oral Daily   vitamin B-12  500 mcg Oral Daily    Family History    Family History  Problem Relation Age of Onset   Diabetes Maternal Grandmother    Heart disease Maternal Grandmother    Heart failure Maternal Grandmother    Heart attack Maternal Grandmother    Rheum arthritis Mother    Healthy Father    Heart Problems Maternal Grandfather    Atrial fibrillation Paternal Grandmother    Cancer Neg Hx    She indicated that her mother is alive. She indicated that her father is alive. She indicated that her maternal grandmother is alive. She indicated that her maternal grandfather is alive. She indicated that her paternal grandmother is deceased. She indicated that the status of her neg hx is unknown.   Social History    Social History   Socioeconomic History   Marital status: Married    Spouse name: Not on file   Number of children: Not on file   Years of education: Not on file   Highest education level: Not on file  Occupational History   Not on file  Social Needs   Financial resource strain: Not on file   Food insecurity    Worry: Not on file    Inability: Not on file   Transportation needs    Medical: Not on file    Non-medical: Not on file  Tobacco Use   Smoking status: Never Smoker   Smokeless tobacco: Never  Used  Substance and Sexual Activity   Alcohol use: Not Currently    Alcohol/week: 0.0 standard drinks   Drug use: No   Sexual activity: Yes    Birth control/protection: Surgical    Comment: tubal  Lifestyle   Physical activity    Days per week: Not on file    Minutes per session: Not on file   Stress: Not on file  Relationships   Social connections    Talks on phone: Not on file    Gets together: Not on file    Attends religious service: Not on file    Active member of club or organization: Not on file    Attends meetings of clubs or organizations:  Not on file    Relationship status: Not on file   Intimate partner violence    Fear of current or ex partner: Not on file    Emotionally abused: Not on file    Physically abused: Not on file    Forced sexual activity: Not on file  Other Topics Concern   Not on file  Social History Narrative   Lives in [place].     Review of Systems    General:  No chills, fever, night sweats or weight changes.  Cardiovascular:  +++ chest pain w/ vomiting/heaving, no dyspnea on exertion, edema, orthopnea.  +++ palpitations at times - usually in setting of awaking suddenly and feeling started.  No paroxysmal nocturnal dyspnea. Dermatological: No rash, lesions/masses Respiratory: No cough, dyspnea Urologic: No hematuria, dysuria Abdominal:   +++ nausea, +++ vomiting, no diarrhea, bright red blood per rectum, melena, or hematemesis Neurologic:  No visual changes, wkns, changes in mental status. All other systems reviewed and are otherwise negative except as noted above.  Physical Exam    Blood pressure 102/82, pulse (!) 122, temperature 97.7 F (36.5 C), temperature source Oral, resp. rate 19, height 5\' 1"  (1.549 m), weight 116.9 kg, SpO2 100 %.  General: Pleasant, NAD Psych: Normal affect. Neuro: Alert and oriented X 3. Moves all extremities spontaneously. HEENT: Normal  Neck: Supple without bruits or JVD. Lungs:  Resp regular  and unlabored, CTA. Heart: RRR no s3, s4, or murmurs. Abdomen: Soft, non-tender, non-distended, BS + x 4.  Extremities: No clubbing, cyanosis or edema. DP/PT/Radials 2+ and equal bilaterally.  Labs     Lab Results  Component Value Date   WBC 10.4 01/16/2019   HGB 13.9 01/16/2019   HCT 41.0 01/16/2019   MCV 84.0 01/16/2019   PLT 296 01/16/2019    Recent Labs  Lab 01/15/19 1721 01/16/19 0535  NA 137 138  K 3.1* 2.9*  CL 97* 101  CO2 24 26  BUN 6 8  CREATININE 0.68 0.54  CALCIUM 9.4 8.6*  PROT 7.1  --   BILITOT 1.6*  --   ALKPHOS 87  --   ALT 67*  --   AST 62*  --   GLUCOSE 91 88   Lab Results  Component Value Date   CHOL 108 01/16/2019   HDL 21 (L) 01/16/2019   LDLCALC 70 01/16/2019   TRIG 84 01/16/2019    Radiology Studies    Mr Brain Wo Contrast  Result Date: 01/15/2019 CLINICAL DATA:  Diplopia.  Double vision 8 days. EXAM: MRI HEAD WITHOUT CONTRAST TECHNIQUE: Multiplanar, multiecho pulse sequences of the brain and surrounding structures were obtained without intravenous contrast. COMPARISON:  None. FINDINGS: Brain: Small area of diffusion weighted hyperintensity in the splenium of the corpus callosum. This shows low signal ADC consistent with restricted diffusion. This may be a subacute infarction. No other areas of restricted diffusion. Ventricle size normal. No other areas of infarction. Negative for hemorrhage or mass. No midline shift. Vascular: Normal arterial flow voids. 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 Otherwise negative. Electronically Signed   By: Franchot Gallo M.D.   On: 01/15/2019 20:43   Ct Abdomen Pelvis W Contrast  Result Date: 01/01/2019 CLINICAL DATA:  Abdominal distension, gastric sleeve, nausea, vomiting EXAM: CT ABDOMEN AND PELVIS WITH CONTRAST TECHNIQUE: Multidetector CT  imaging of the abdomen and pelvis was performed using the  standard protocol following bolus administration of intravenous contrast. CONTRAST:  172mL OMNIPAQUE IOHEXOL 300 MG/ML  SOLN COMPARISON:  CT 02/14/2016 FINDINGS: Lower chest: Lung bases are clear. No effusions. Heart is normal size. Hepatobiliary: Prior cholecystectomy. Diffuse fatty infiltration of the liver. No focal hepatic abnormality. Pancreas: No focal abnormality or ductal dilatation. Spleen: No focal abnormality.  Normal size. Adrenals/Urinary Tract: Horseshoe kidney again noted. No hydronephrosis or renal mass. Small nonobstructing stone in the lower pole of the left kidney. No ureteral stones. Adrenal glands and urinary bladder unremarkable Stomach/Bowel: Normal appendix. Prior gastric sleeve. No complicating features. No bowel obstruction. Vascular/Lymphatic: No evidence of aneurysm or adenopathy. Reproductive: Uterus and adnexa unremarkable.  No mass. Other: No free fluid or free air. Musculoskeletal: Sclerosis around the SI joints bilaterally compatible with sacroiliitis. No acute bony abnormality. IMPRESSION: Changes of gastric sleeve.  No complicating feature. Horseshoe kidney.  Left lower pole nonobstructing nephrolithiasis. No acute findings in the abdomen or pelvis. Fatty infiltration of the liver. Electronically Signed   By: Rolm Baptise M.D.   On: 01/01/2019 15:38   US Carotid Bilateral  Result Date: 01/16/2019 CLINICAL DATA:  Double vision, small acute stroke EXAM: BILATERAL CAROTID DUPLEX ULTRASOUND TECHNIQUE: Pearline Cables scale imaging, color Doppler and duplex ultrasound were performed of bilateral carotid and vertebral arteries in the neck. COMPARISON:  01/15/2019 FINDINGS: Criteria: Quantification of carotid stenosis is based on velocity parameters that correlate the residual internal carotid diameter with NASCET-based stenosis levels, using the diameter of the distal internal carotid lumen as the denominator for stenosis measurement.  The following velocity measurements were obtained: RIGHT ICA: 84/30 cm/sec CCA: 69/62 cm/sec SYSTOLIC ICA/CCA RATIO:  1.1 ECA: 69 cm/sec LEFT ICA: 80/38 cm/sec CCA: 95/28 cm/sec SYSTOLIC ICA/CCA RATIO:  1.4 ECA: 66 cm/sec RIGHT CAROTID ARTERY: No significant plaque formation. No hemodynamically significant right ICA stenosis, velocity elevation, or turbulent flow. Degree of narrowing less than 50%. RIGHT VERTEBRAL ARTERY:  Antegrade LEFT CAROTID ARTERY: No significant plaque formation. No hemodynamically significant left ICA stenosis, velocity elevation, or turbulent flow. LEFT VERTEBRAL ARTERY:  Antegrade IMPRESSION: No significant carotid atherosclerosis. No hemodynamically significant ICA stenosis. Degree of narrowing less than 50% bilaterally by ultrasound criteria. Patent antegrade vertebral flow bilaterally Electronically Signed   By: Jerilynn Mages.  Shick M.D.   On: 01/16/2019 09:24    ECG & Cardiac Imaging    Sinus Tachycardia, 102, no acute ST/T changes - personally reviewed.  Assessment & Plan    1.  Sinus Tachycardia:  Pt w/ a h/o palpitations, sinus tach, brief runs of SVT, and PVCs by prior ZIO, which have previously been reasonably controlled w/ propranolol therapy.  Since her sleeve gastrectomy in June, she has not been able to take her propranolol, or other meds, 2/2 severe/persistent nausea and vomiting.  She was admitted here w/ a 2 wk h/o diplopia and was found to have small focus of restricted diffusion splenium of corpus callosum most consistent with acute or subacute infarction.  She has been fairly persistently tachycardic on tele and there was concern for atrial flutter, however, review of tele shows sinus tachycardia only.  Pt has had palpitations, and rates sometimes rise into the 150's.  She says that this seems to happen when she awakes, startled, from sleep.  She does have a h/o OSA, and is not currently using CPAP.  Propranolol was just resumed today.  Cont  blocker, and consider  resumption of CPAP if she can tolerate, now that n/v has improved over the last three days.  Echo w/ bubble study pending.  In setting of stroke, can consider repeat outpt event monitoring to further assess for atrial arrhythmias if desired by neuro.  2.  Corpus callosum stroke/Diplopia: On ASA.  Carotid u/s neg.  Seen by neuro.    3.  Essential HTN:  BP soft in setting of n/v/dehydration.  Follow w/ resumption of  blocker.  4.  N/V: Since sleeve gastrectomy in June.  Finally getting relief w/ phenergan and zofran.  Per IM.  5.  Hypokalemia:  In setting of #4.  IV supplementation ordered.  6.  HL:  On statin.  LDL 70.  7.  OSA:  Resume CPAP if n/v cleared.  Signed, Murray Hodgkins, NP 01/16/2019, 3:48 PM  For questions or updates, please contact   Please consult [email or web address] for contact info under Cardiology/STEMI.

## 2019-01-16 NOTE — Evaluation (Signed)
Occupational Therapy Evaluation Patient Details Name: Leah Osborne MRN: [id number] DOB: 04/24/1986 Today's Date: 01/16/2019    History of Present Illness 33 y.o. female 33 year old female with PMHx of morbid obesity, hypertension, IBS-mixed, bipolar disorder, PTSD, depression, and recent robotic gastric sleeve (11/19/2018) was transferred from [facility] to [facility] for concern of nausea/vomiting x6 weeks, and 2 weeks of dizziness, tinnitus, and vision changes.  Patient was initially hospitalized at [facility] for 4 days.  Patient was discharged from [facility] with ongoing complaints with double vision, pt informed to follow up with her ophthalmologist.  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  Sent the patient to the ED today for further evaluation, concern for Wernicke's encephalopathy given her prolonged malnutrition since her gastric sleeve.  Patient complains of double vision.  Also states that she has had an unsteady gait.  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 Otherwise negative.   Clinical Impression   Pt seen for OT evaluation this date. Pt lives in a split level home with spouse and 3 kids 75 years and younger. At baseline, pt was independent in all aspects. Pt currently presents with diplopia (resolves with either eye closed, images are side by side but R side image leans/rotates to the R), nausea, and impaired balance which results in the need for increased assist for functional mobility and ADL tasks involving transfers and standing. Pt educated in occlusion therapy for glasses to address diplopia including eye exercises and process. Handout provided. MD provided written approval of the use of occlusion therapy for eye glasses. Pt verbalized understanding. Pt at supervision to CGA (PRN Min A ) for seated LB ADL, difficulty with BSC transfer  2/2 dizziness. HR and BP elevated with sitting and then standing; RN notified. Functional mobility assessment limited 2/2 dizziness and elevated HR and BP. No focal weakness or sensory/coordination/cognitive deficits appreciated. Pt would benefit from additional skilled OT services to maximize return to PLOF and minimize risk of falls and increased caregiver burden. Will continue to assess next date for appropriate discharge recommendation.      Follow Up Recommendations  Other (comment)(TBD once able to assess functional mobility more thoroughly)    Equipment Recommendations  None recommended by OT    Recommendations for Other Services       Precautions / Restrictions Precautions Precautions: Fall Precaution Comments: watch BP and HR, double vision with both eyes open (goes away with 1 eye occluded) Restrictions Weight Bearing Restrictions: No      Mobility Bed Mobility Overal bed mobility: Modified Independent                Transfers Overall transfer level: Needs assistance Equipment used: None Transfers: Sit to/from Stand Sit to Stand: Min guard         General transfer comment: initially unsteady but able to self correct    Balance Overall balance assessment: Needs assistance Sitting-balance support: Feet supported Sitting balance-Leahy Scale: Good     Standing balance support: Single extremity supported Standing balance-Leahy Scale: Poor Standing balance comment: reaches for at least unilateral UE support, uses legs on bed posteriorly to stand                           ADL either performed or assessed with clinical judgement   ADL Overall ADL's : Needs assistance/impaired  Toilet Transfer: Glass blower/designer Details (indicate cue type and reason): amb approx 3 ft, unsteady Toileting- Clothing Manipulation and Hygiene: Set up;Sitting/lateral lean         General ADL Comments: CGA to  Min A for ADL tasks involving LB and/or functional transfers 2/2 dizziness and diplopia impairing her balance     Vision Baseline Vision/History: Wears glasses Wears Glasses: At all times Patient Visual Report: Diplopia Vision Assessment?: Yes Eye Alignment: Within Functional Limits Ocular Range of Motion: Within Functional Limits Alignment/Gaze Preference: Within Defined Limits Tracking/Visual Pursuits: Able to track stimulus in all quads without difficulty Visual Fields: No apparent deficits Diplopia Assessment: Objects split side to side;Disappears with one eye closed;Other (comment)(objects split side by side but R image tends to rotate/lean to the R side)     Perception     Praxis      Pertinent Vitals/Pain Pain Assessment: No/denies pain     Hand Dominance Right   Extremity/Trunk Assessment Upper Extremity Assessment Upper Extremity Assessment: Overall WFL for tasks assessed(grossly 4/5 bilaterally, intact coordination and sensation) RUE Deficits / Details: grossly 4/5 RUE Sensation: WNL RUE Coordination: WNL LUE Deficits / Details: grossly 4/5 LUE Sensation: WNL LUE Coordination: WNL   Lower Extremity Assessment Lower Extremity Assessment: Defer to PT evaluation;Overall [facility] for tasks assessed(grossly 4+/5 bilaterally, intact sensation and coordination) RLE Deficits / Details: grossly 4+/5 RLE Coordination: WNL LLE Deficits / Details: grossly 4+/5 LLE Sensation: WNL LLE Coordination: WNL   Cervical / Trunk Assessment Cervical / Trunk Assessment: Normal   Communication Communication Communication: No difficulties   Cognition Arousal/Alertness: Awake/alert Behavior During Therapy: WFL for tasks assessed/performed Overall Cognitive Status: Within Functional Limits for tasks assessed                                 General Comments: eyes often closed during session to address dizziness/double vision   General Comments  BP and HR elevated,  increasing with sitting and then standing, recovers fairly quickly once sitting or laying back down, pt endorses worsening dizziness with increase in HR/BP; RN notified    Exercises Other Exercises Other Exercises: occlusion therapy to eye glasses on R lens (L eye dominant), pt instructed in process and how to progress; handout provided - will address next session Other Exercises: PLB to support recovery after exertion and lower HR/BP   Shoulder Instructions      Home Living Family/patient expects to be discharged to:: Private residence Living Arrangements: Spouse/significant other;Children(3 kids under 70 years old) Available Help at Discharge: Family;Available PRN/intermittently Type of Home: House       Home Layout: Two level   Alternate Level Stairs-Rails: Right           Home Equipment: Grab bars - toilet;Grab bars - tub/shower          Prior Functioning/Environment Level of Independence: Independent                 OT Problem List: Cardiopulmonary status limiting activity;Impaired balance (sitting and/or standing);Impaired vision/perception      OT Treatment/Interventions: Self-care/ADL training;Therapeutic exercise;Therapeutic activities;Neuromuscular education;Visual/perceptual remediation/compensation;DME and/or AE instruction;Patient/family education;Balance training    OT Goals(Current goals can be found in the care plan section) Acute Rehab OT Goals Patient Stated Goal: return to PLOF and go home OT Goal Formulation: With patient Time For Goal Achievement: 01/30/19 Potential to Achieve Goals: Good ADL Goals Pt Will Perform Lower Body Dressing: with min  guard assist;sit to/from stand;with supervision Pt Will Transfer to Toilet: with supervision;ambulating;bedside commode Additional ADL Goal #1: Pt will verbalize understanding of occlusion therapy for eye glasses and how to progress with handout to support recall and carryover.  OT Frequency: Min  2X/week   Barriers to D/C:            Co-evaluation              AM-PAC OT "6 Clicks" Daily Activity     Outcome Measure Help from another person eating meals?: None Help from another person taking care of personal grooming?: None Help from another person toileting, which includes using toliet, bedpan, or urinal?: A Little Help from another person bathing (including washing, rinsing, drying)?: A Little Help from another person to put on and taking off regular upper body clothing?: None Help from another person to put on and taking off regular lower body clothing?: A Little 6 Click Score: 21   End of Session Equipment Utilized During Treatment: Gait belt Nurse Communication: Other (comment)(BP, HR, urinated)  Activity Tolerance: Patient tolerated treatment well Patient left: in bed;with call bell/phone within reach;with bed alarm set  OT Visit Diagnosis: Other abnormalities of gait and mobility (R26.89);Low vision, both eyes (H54.2);Dizziness and giddiness (R42)                Time: 1157-2620 OT Time Calculation (min): 41 min Charges:  OT General Charges $OT Visit: 1 Visit OT Evaluation $OT Eval Low Complexity: 1 Low OT Treatments $Self Care/Home Management : 8-22 mins $Therapeutic Activity: 8-22 mins  Jeni Salles, MPH, MS, OTR/L ascom [phone] 01/16/19, 3:51 PM

## 2019-01-16 NOTE — Progress Notes (Signed)
OT Cancellation Note  Patient Details Name: Leah Osborne MRN: [id number] DOB: February 08, 1986   Cancelled Treatment:    Reason Eval/Treat Not Completed: Patient at procedure or test/ unavailable. Order received, chart reviewed. Pt out of room for testing. Will re-attempt OT evaluation at later date/time as pt is available and medically appropriate.  Jeni Salles, MPH, MS, OTR/L ascom [phone] 01/16/19, 9:04 AM

## 2019-01-16 NOTE — Progress Notes (Signed)
   01/16/19 0900  Clinical Encounter Type  Visited With Patient  Visit Type Initial  Referral From Nurse  Spiritual Encounters  Spiritual Needs Other (Comment) (Advance Directives)  Stress Factors  Patient Stress Factors Health changes  Family Stress Factors Loss of control  Ch received an OR for AD education. Pt was sitting up in bed. Ch educated the pt on how to complete the AD. Pt wants her mother to be HCPOA but wants to discuss it with her husband first. Pt said this is her sixth admission since last June. Pt is holding up well and said her husband is struggling. Pt has three kids the age of 65,6,9 and the youngest is taking her mother's frequent absence hard. Pt said she wanted to have a concrete measure such as the AD in place because there are so many uncertainties. Ch informed her of ways to reach the chaplaincy service. Pt expressed tiredness so ch left to give her time to rest.

## 2019-01-16 NOTE — Progress Notes (Signed)
*  PRELIMINARY RESULTS* Echocardiogram 2D Echocardiogram has been performed.  Leah Osborne 01/16/2019, 1:14 PM

## 2019-01-16 NOTE — Consult Note (Signed)
Reason for Consult: diplopia  Referring Physician: Dr. Tressia Miners  CC: binocular diplopia   HPI: Leah Osborne is an 33 y.o. female with PMHx of morbid obesity, hypertension, IBS-mixed, bipolar disorder, PTSD, depression, and recent robotic gastric sleeve (11/19/2018) was transferred from [facility] for concern of nausea/vomiting x6 weeks, and 2 weeks of dizziness, tinnitus, and vision changes.   On examination patient does have binocular diplopia that improves with closure of either eye.    Past Medical History:  Diagnosis Date  . Abnormal Pap smear of cervix   . Arthritis   . Arthritis, rheumatoid ([place])   . Bipolar 1 disorder ([place])   . Chest pain   . Headache    MIGRAINES  . Hyperlipidemia   . Hypertension   . Palpitations    a. 09/2017 Zio Monitor: predominantly RSR, avg HR 87, 11 A tach/SVT episodes, fastest 176 x 7 beats, longest 9 beats. Rare PAC's, occas PVC's, rare couplets/triplets.  Marland Kitchen PTSD (post-traumatic stress disorder)   . PVC (premature ventricular contraction)   . Social phobia     Past Surgical History:  Procedure Laterality Date  . CHOLECYSTECTOMY    . LAPAROSCOPIC GASTRIC SLEEVE RESECTION  11/19/2018  . LAPAROSCOPIC TUBAL LIGATION Bilateral 09/19/2015   Procedure: LAPAROSCOPIC BILATERAL TUBAL BANDING;  Surgeon: Brayton Mars, MD;  Location: [facility] ORS;  Service: Gynecology;  Laterality: Bilateral;    Family History  Problem Relation Age of Onset  . Diabetes Maternal Grandmother   . Heart disease Maternal Grandmother   . Heart failure Maternal Grandmother   . Heart attack Maternal Grandmother   . Rheum arthritis Mother   . Healthy Father   . Heart Problems Maternal Grandfather   . Atrial fibrillation Paternal Grandmother   . Cancer Neg Hx     Social History:  reports that she has never smoked. She has never used smokeless tobacco. She reports previous alcohol use. She reports that she does not use drugs.  No Known Allergies  Medications: I have  reviewed the patient's current medications.  ROS: History obtained from the patient  General ROS: negative for - chills, fatigue, fever, night sweats, weight gain or weight loss Psychological ROS: negative for - behavioral disorder, hallucinations, memory difficulties, mood swings or suicidal ideation Ophthalmic ROS: negative for - blurry vision, double vision, eye pain or loss of vision ENT ROS: negative for - epistaxis, nasal discharge, oral lesions, sore throat, tinnitus or vertigo Allergy and Immunology ROS: negative for - hives or itchy/watery eyes Hematological and Lymphatic ROS: negative for - bleeding problems, bruising or swollen lymph nodes Endocrine ROS: negative for - galactorrhea, hair pattern changes, polydipsia/polyuria or temperature intolerance Respiratory ROS: negative for - cough, hemoptysis, shortness of breath or wheezing Cardiovascular ROS: negative for - chest pain, dyspnea on exertion, edema or irregular heartbeat Gastrointestinal ROS: negative for - abdominal pain, diarrhea, hematemesis, nausea/vomiting or stool incontinence Genito-Urinary ROS: negative for - dysuria, hematuria, incontinence or urinary frequency/urgency Musculoskeletal ROS: negative for - joint swelling or muscular weakness Neurological ROS: as noted in HPI Dermatological ROS: negative for rash and skin lesion changes  Physical Examination: Blood pressure (!) 121/92, pulse 98, temperature (!) 97.5 F (36.4 C), temperature source Oral, resp. rate 18, height 5\' 1"  (1.549 m), weight 116.9 kg, SpO2 98 %.    Neurological Examination   Mental Status: Alert, oriented, thought content appropriate.  Speech fluent without evidence of aphasia.  Able to follow 3 step commands without difficulty. Cranial Nerves: II: Discs flat bilaterally; Visual fields  grossly normal, pupils equal, round, reactive to light and accommodation III,IV, [id number] diplopia relieved with closure of either eye.  V,VII: smile  symmetric, facial light touch sensation normal bilaterally VIII: hearing normal bilaterally IX,X: gag reflex present XI: bilateral shoulder shrug XII: midline tongue extension Motor: Right : Upper extremity   5/5    Left:     Upper extremity   5/5  Lower extremity   5/5     Lower extremity   5/5 Tone and bulk:normal tone throughout; no atrophy noted Sensory: Pinprick and light touch intact throughout, bilaterally Deep Tendon Reflexes: 1+ and symmetric throughout Plantars: Right: downgoing   Left: downgoing Cerebellar: Not tested Gait: not tested      Laboratory Studies:   Basic Metabolic Panel: Recent Labs  Lab 01/10/19 1935 01/11/19 0416 01/11/19 1459 01/12/19 0409 01/15/19 1721 01/16/19 0535  NA 138 141  --  142 137 138  K 3.2* 3.1*  --  3.2* 3.1* 2.9*  CL 97* 104  --  106 97* 101  CO2 23 27  --  25 24 26   GLUCOSE 91 103*  --  79 91 88  BUN 9 7  --  <5* 6 8  CREATININE 0.71 0.57  --  0.53 0.68 0.54  CALCIUM 9.1 8.3*  --  8.3* 9.4 8.6*  MG  --   --  1.9  --  1.8  --     Liver Function Tests: Recent Labs  Lab 01/10/19 1935 01/15/19 1721  AST 30 62*  ALT 28 67*  ALKPHOS 71 87  BILITOT 1.5* 1.6*  PROT 7.3 7.1  ALBUMIN 4.1 3.9   Recent Labs  Lab 01/10/19 1935  LIPASE 76*   No results for input(s): AMMONIA in the last 168 hours.  CBC: Recent Labs  Lab 01/10/19 1935 01/11/19 0416 01/15/19 1721 01/16/19 0535  WBC 10.3 10.2 11.4* 10.4  HGB 16.6* 14.4 16.5* 13.9  HCT 49.5* 43.6 49.5* 41.0  MCV 84.2 84.2 85.3 84.0  PLT 378 307 376 296    Cardiac Enzymes: No results for input(s): CKTOTAL, CKMB, CKMBINDEX, TROPONINI in the last 168 hours.  BNP: Invalid input(s): POCBNP  CBG: No results for input(s): GLUCAP in the last 168 hours.  Microbiology: Results for orders placed or performed during the hospital encounter of 01/15/19  SARS Coronavirus 2 (CEPHEID- Performed in [place] hospital lab), Hosp Order     Status: None   Collection Time:  01/16/19  4:10 AM   Specimen: Nasopharyngeal Swab  Result Value Ref Range Status   SARS Coronavirus 2 NEGATIVE NEGATIVE Final    Comment: (NOTE) If result is NEGATIVE SARS-CoV-2 target nucleic acids are NOT DETECTED. The SARS-CoV-2 RNA is generally detectable in upper and lower  respiratory specimens during the acute phase of infection. The lowest  concentration of SARS-CoV-2 viral copies this assay can detect is 250  copies / mL. A negative result does not preclude SARS-CoV-2 infection  and should not be used as the sole basis for treatment or other  patient management decisions.  A negative result may occur with  improper specimen collection / handling, submission of specimen other  than nasopharyngeal swab, presence of viral mutation(s) within the  areas targeted by this assay, and inadequate number of viral copies  (<250 copies / mL). A negative result must be combined with clinical  observations, patient history, and epidemiological information. If result is POSITIVE SARS-CoV-2 target nucleic acids are DETECTED. The SARS-CoV-2 RNA is generally detectable in upper and  lower  respiratory specimens dur ing the acute phase of infection.  Positive  results are indicative of active infection with SARS-CoV-2.  Clinical  correlation with patient history and other diagnostic information is  necessary to determine patient infection status.  Positive results do  not rule out bacterial infection or co-infection with other viruses. If result is PRESUMPTIVE POSTIVE SARS-CoV-2 nucleic acids MAY BE PRESENT.   A presumptive positive result was obtained on the submitted specimen  and confirmed on repeat testing.  While 2019 novel coronavirus  (SARS-CoV-2) nucleic acids may be present in the submitted sample  additional confirmatory testing may be necessary for epidemiological  and / or clinical management purposes  to differentiate between  SARS-CoV-2 and other Sarbecovirus currently known to  infect humans.  If clinically indicated additional testing with an alternate test  methodology [phone]) is advised. The SARS-CoV-2 RNA is generally  detectable in upper and lower respiratory sp ecimens during the acute  phase of infection. The expected result is Negative. Fact Sheet for Patients:  StrictlyIdeas.no Fact Sheet for Healthcare Providers: BankingDealers.co.za This test is not yet approved or cleared by the [place] FDA and has been authorized for detection and/or diagnosis of SARS-CoV-2 by FDA under an Emergency Use Authorization (EUA).  This EUA will remain in effect (meaning this test can be used) for the duration of the COVID-19 declaration under Section 564(b)(1) of the Act, 21 U.S.C. section 360bbb-3(b)(1), unless the authorization is terminated or revoked sooner. Performed at [facility], [address] [place],  [address]     Coagulation Studies: No results for input(s): LABPROT, INR in the last 72 hours.  Urinalysis:  Recent Labs  Lab 01/10/19 2243  COLORURINE AMBER*  LABSPEC 1.028  PHURINE 6.0  GLUCOSEU NEGATIVE  HGBUR MODERATE*  BILIRUBINUR MODERATE*  KETONESUR 80*  PROTEINUR 100*  NITRITE NEGATIVE  LEUKOCYTESUR SMALL*    Lipid Panel:     Component Value Date/Time   CHOL 108 01/16/2019 0535   TRIG 84 01/16/2019 0535   HDL 21 (L) 01/16/2019 0535   CHOLHDL 5.1 01/16/2019 0535   VLDL 17 01/16/2019 0535   LDLCALC 70 01/16/2019 0535   LDLCALC 93 10/14/2018 1125    HgbA1C:  Lab Results  Component Value Date   HGBA1C 5.8 (H) 10/14/2018    Urine Drug Screen:      Component Value Date/Time   LABOPIA NONE DETECTED 10/22/2016 1437   COCAINSCRNUR NONE DETECTED 10/22/2016 1437   LABBENZ NONE DETECTED 10/22/2016 1437   AMPHETMU NONE DETECTED 10/22/2016 1437   THCU NONE DETECTED 10/22/2016 1437   LABBARB NONE DETECTED 10/22/2016 1437    Alcohol Level: No results for  input(s): ETH in the last 168 hours.  Other results: EKG: normal EKG, normal sinus rhythm, unchanged from previous tracings.  Imaging: Mr Brain Wo Contrast  Result Date: 01/15/2019 CLINICAL DATA:  Diplopia.  Double vision 8 days. EXAM: MRI HEAD WITHOUT CONTRAST TECHNIQUE: Multiplanar, multiecho pulse sequences of the brain and surrounding structures were obtained without intravenous contrast. COMPARISON:  None. FINDINGS: Brain: Small area of diffusion weighted hyperintensity in the splenium of the corpus callosum. This shows low signal ADC consistent with restricted diffusion. This may be a subacute infarction. No other areas of restricted diffusion. Ventricle size normal. No other areas of infarction. Negative for hemorrhage or mass. No midline shift. Vascular: Normal arterial flow voids. 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 Otherwise negative. Electronically Signed   By: Franchot Gallo M.D.   On: 01/15/2019 20:43   US Carotid Bilateral  Result Date: 01/16/2019 CLINICAL DATA:  Double vision, small acute stroke EXAM: BILATERAL CAROTID DUPLEX ULTRASOUND TECHNIQUE: Pearline Cables scale imaging, color Doppler and duplex ultrasound were performed of bilateral carotid and vertebral arteries in the neck. COMPARISON:  01/15/2019 FINDINGS: Criteria: Quantification of carotid stenosis is based on velocity parameters that correlate the residual internal carotid diameter with NASCET-based stenosis levels, using the diameter of the distal internal carotid lumen as the denominator for stenosis measurement. The following velocity measurements were obtained: RIGHT ICA: 84/30 cm/sec CCA: 17/00 cm/sec SYSTOLIC ICA/CCA RATIO:  1.1 ECA: 69 cm/sec LEFT ICA: 80/38 cm/sec CCA: 17/49 cm/sec SYSTOLIC ICA/CCA RATIO:  1.4 ECA: 66 cm/sec RIGHT CAROTID ARTERY: No significant  plaque formation. No hemodynamically significant right ICA stenosis, velocity elevation, or turbulent flow. Degree of narrowing less than 50%. RIGHT VERTEBRAL ARTERY:  Antegrade LEFT CAROTID ARTERY: No significant plaque formation. No hemodynamically significant left ICA stenosis, velocity elevation, or turbulent flow. LEFT VERTEBRAL ARTERY:  Antegrade IMPRESSION: No significant carotid atherosclerosis. No hemodynamically significant ICA stenosis. Degree of narrowing less than 50% bilaterally by ultrasound criteria. Patent antegrade vertebral flow bilaterally Electronically Signed   By: Jerilynn Mages.  Shick M.D.   On: 01/16/2019 09:24     Assessment/Plan:  33 y.o. female with PMHx of morbid obesity, hypertension, IBS-mixed, bipolar disorder, PTSD, depression, and recent robotic gastric sleeve (11/19/2018) was transferred from [facility] for concern of nausea/vomiting x6 weeks, and 2 weeks of dizziness, tinnitus, and vision changes.   On examination patient does have binocular diplopia that improves with closure of either eye.    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  - callosal connections interconnect homologous areas of visual cortices, binding together the two halves of the visual field hence the binocular diplopia resolves when closing either eye - Pt is appropriately started on ASA  - Vision should improve as there will be new connections formed  - No further testing neuro perspective.   01/16/2019, 11:35 AM

## 2019-01-17 LAB — BASIC METABOLIC PANEL
Anion gap: 10 (ref 5–15)
BUN: 10 mg/dL (ref 6–20)
CO2: 25 mmol/L (ref 22–32)
Calcium: 8.7 mg/dL — ABNORMAL LOW (ref 8.9–10.3)
Chloride: 106 mmol/L (ref 98–111)
Creatinine, Ser: 0.59 mg/dL (ref 0.44–1.00)
GFR calc Af Amer: >60 mL/min (ref >60)
GFR calc non Af Amer: >60 mL/min (ref >60)
Glucose, Bld: 85 mg/dL (ref 70–99)
Potassium: 3.3 mmol/L — ABNORMAL LOW (ref 3.5–5.1)
Sodium: 141 mmol/L (ref 135–145)

## 2019-01-17 MED ORDER — POTASSIUM CHLORIDE CRYS ER 20 MEQ PO TBCR
40.0000 meq | EXTENDED_RELEASE_TABLET | Freq: Once | ORAL | Status: AC
  Administered 2019-01-17: 40 meq via ORAL
  Filled 2019-01-17: qty 2

## 2019-01-17 NOTE — Progress Notes (Signed)
Occupational Therapy Treatment Patient Details Name: Leah Osborne MRN: [id number] DOB: February 01, 1986 Today's Date: 01/17/2019    History of present illness 33 y.o. female 33 year old female with PMHx of morbid obesity, hypertension, IBS-mixed, bipolar disorder, PTSD, depression, and recent robotic gastric sleeve (11/19/2018) was transferred from [facility] to [facility] for concern of nausea/vomiting x6 weeks, and 2 weeks of dizziness, tinnitus, and vision changes.  Patient was initially hospitalized at [facility] for 4 days.  Patient was discharged from [facility] with ongoing complaints with double vision, pt informed to follow up with her ophthalmologist.  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  Sent the patient to the ED today for further evaluation, concern for Wernicke's encephalopathy given her prolonged malnutrition since her gastric sleeve.  Patient complains of double vision.  Also states that she has had an unsteady gait.  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 Otherwise negative.   OT comments  CGA for ADLs due to dizziness and low BP.  Reviewed set up of occlusion therapy for her dipolopia and changed the tape from clear to white since this will help her use L eye only.  Reviewed handout and rec to remove white tape as her vision improves and discuss with MD and Neurology.  Reviewed other rec like taping edges of cupboards, remove throw rugs and any clutter, increase lighting in hallways and avoid using sharp objects or knives.  Pt would benefit from Outpatient OT but since she only has her grandmother to provide transportation she does not have anyone to watch her kids and rec OT HH.  Pt with increased diplopia in L visual filed when looking left and right during therapeutic exercises sitting EOB.  BP was low lying in bed (100/57) and she was dizzy  for about 7 minutes sitting EOB and BP 108/59 with pulse 80 and O2 sats 100% and NSG updated.Reviewed need to hydrate and rec ways to do this at home.  Follow Up Recommendations  Home health OT    Equipment Recommendations  None recommended by OT    Recommendations for Other Services      Precautions / Restrictions Precautions Precautions: Fall Precaution Comments: watch BP and HR, double vision with both eyes open (goes away with 1 eye occluded) Restrictions Weight Bearing Restrictions: No       Mobility Bed Mobility                  Transfers                      Balance                                           ADL either performed or assessed with clinical judgement   ADL                                         General ADL Comments: CGA for ADLs due to dizziness and low BP.  Reviewed set up of occlusion therapy for her dipolopia and changed the tape from clear to white since this will help her use L eye only.  Reviewed handout and rec to remove white tape  as her vision improves and discuss with MD and Neurology.  Reviewed other rec like taping edges of cupboards, remove throw rugs and any clutter, increase lighting in hallways and avoid using sharp objects or knives.  Pt would benefit from Outpatient OT but since she only has her grandmother to provide transportation she does not have anyone to watch her kids and rec OT HH.  Pt with increased diplopia in L visual filed when looking left and right during therapeutic exercises sitting EOB.  BP was low lying in bed (100/57) and she was dizzy for about 7 minutes sitting EOB and BP 108/59 with pulse 80 and O2 sats 100% and NSG updated.Reviewed need to hydrate and rec ways to do this at home.     Vision Baseline Vision/History: Wears glasses(usually wears contacts) Wears Glasses: At all times Patient Visual Report: Diplopia     Perception     Praxis      Cognition  Arousal/Alertness: Awake/alert Behavior During Therapy: WFL for tasks assessed/performed Overall Cognitive Status: Within Functional Limits for tasks assessed                                 General Comments: eyes often closed during session to address dizziness/double vision even with glasses with tape to occlude R eye        Exercises     Shoulder Instructions       General Comments      Pertinent Vitals/ Pain       Pain Assessment: No/denies pain  Home Living                                          Prior Functioning/Environment              Frequency  Min 2X/week        Progress Toward Goals  OT Goals(current goals can now be found in the care plan section)  Progress towards OT goals: Progressing toward goals  Acute Rehab OT Goals Patient Stated Goal: return to PLOF and go home OT Goal Formulation: With patient Time For Goal Achievement: 01/30/19 Potential to Achieve Goals: Good  Plan      Co-evaluation                 AM-PAC OT "6 Clicks" Daily Activity     Outcome Measure   Help from another person eating meals?: None Help from another person taking care of personal grooming?: None Help from another person toileting, which includes using toliet, bedpan, or urinal?: A Little Help from another person bathing (including washing, rinsing, drying)?: A Little Help from another person to put on and taking off regular upper body clothing?: None Help from another person to put on and taking off regular lower body clothing?: A Little 6 Click Score: 21    End of Session Equipment Utilized During Treatment: Gait belt  OT Visit Diagnosis: Other abnormalities of gait and mobility (R26.89);Low vision, both eyes (H54.2);Dizziness and giddiness (R42)   Activity Tolerance Patient tolerated treatment well   Patient Left in bed;with call bell/phone within reach;with bed alarm set   Nurse Communication Other  (comment)(dizziness and BP reported to NSG)        Time: 0930-1016 OT Time Calculation (min): 46 min  Charges: OT General Charges $OT Visit: 1 Visit OT  Treatments $Self Care/Home Management : 8-22 mins $Therapeutic Activity: 23-37 mins  Chrys Racer, OTR/L, [place] ascom [phone] 01/17/19, 10:25 AM

## 2019-01-17 NOTE — Progress Notes (Signed)
Physical Therapy Treatment Patient Details Name: Leah Osborne MRN: [id number] DOB: 1985-10-09 Today's Date: 01/17/2019    History of Present Illness 33 y.o. female 33 year old female with PMHx of morbid obesity, hypertension, IBS-mixed, bipolar disorder, PTSD, depression, and recent robotic gastric sleeve (11/19/2018) was transferred from [facility] to [facility] for concern of nausea/vomiting x6 weeks, and 2 weeks of dizziness, tinnitus, and vision changes.  Patient was initially hospitalized at [facility] for 4 days.  Patient was discharged from [facility] with ongoing complaints with double vision, pt informed to follow up with her ophthalmologist.  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  Sent the patient to the ED today for further evaluation, concern for Wernicke's encephalopathy given her prolonged malnutrition since her gastric sleeve.  Patient complains of double vision.  Also states that she has had an unsteady gait.  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 Otherwise negative.    PT Comments    Pt presents with min deficits in strength, transfers, gait, balance, and activity tolerance but made excellent progress towards goals this session.  Pt's supine BP 104/59 mmHg and sitting 117/77 mmHg with no adverse symptoms noted during the session.  Progressed session slowly with pt initially c/o feeling weak and "shakey" in the legs upon initial stand.  Pt able to amb 1 x 10', 1 x 30', and then 1 x 150' with seated therapeutic rest breaks between sessions.  Pt was able to ascend and descend 1 step x 1, 2 steps x 1, and then 4 steps x 1 with seated therapeutic rest breaks between attempts.  Min verbal cues for sequencing with pt demonstrating good control and stability ascending and descending steps with B rails.  During static standing balance training pt  presented with min instability without UE support and a wide BOS but was able to self-correct without assistance.  Pt would benefit from use of a RW which the pt stated her mother has purchased today.  Pt will benefit from HHPT services upon discharge to safely address above deficits for decreased caregiver assistance and eventual return to PLOF.      Follow Up Recommendations  Home health PT     Equipment Recommendations  Other (comment)(Pt's mother purchased RW per pt)    Recommendations for Other Services       Precautions / Restrictions Precautions Precautions: Fall Precaution Comments: watch BP and HR, double vision with both eyes open (goes away with 1 eye occluded) Restrictions Weight Bearing Restrictions: No    Mobility  Bed Mobility Overal bed mobility: Independent             General bed mobility comments: Good speed and effort with bed mobility tasks  Transfers Overall transfer level: Needs assistance Equipment used: Rolling walker (2 wheeled) Transfers: Sit to/from Stand Sit to Stand: Supervision         General transfer comment: Good eccentric and concentric control with transfers  Ambulation/Gait Ambulation/Gait assistance: Min guard Gait Distance (Feet): 1 x 10', 1 x 30', 1 x 150' Assistive device: Rolling walker (2 wheeled) Gait Pattern/deviations: Step-through pattern;Decreased step length - right;Decreased step length - left Gait velocity: decreased   General Gait Details: Slow cadence with amb with short B step length but steady without LOB   Stairs Stairs: Yes Stairs assistance: Min guard Stair Management: Two rails Number of Stairs: 4 General stair comments: 1 step x 1,  2 steps x 1, 4 steps x 1 with seated therapeutic rest breaks between attempts.  Min verbal cues for sequencing with pt demonstrating good control and stability ascending and descending steps with B rails   Wheelchair Mobility    Modified Rankin (Stroke Patients  Only)       Balance Overall balance assessment: Needs assistance Sitting-balance support: Feet supported Sitting balance-Leahy Scale: Good     Standing balance support: Bilateral upper extremity supported Standing balance-Leahy Scale: Good                              Cognition Arousal/Alertness: Awake/alert Behavior During Therapy: WFL for tasks assessed/performed Overall Cognitive Status: Within Functional Limits for tasks assessed                                        Exercises Total Joint Exercises Ankle Circles/Pumps: Strengthening;Both;10 reps Quad Sets: Strengthening;Both;10 reps Gluteal Sets: Strengthening;Both;10 reps Hip ABduction/ADduction: AROM;Both;5 reps Long Arc Quad: Strengthening;10 reps;15 reps;Both Knee Flexion: Strengthening;Both;10 reps;15 reps Marching in Standing: AROM;Both;10 reps Other Exercises Other Exercises: Static standing balance training without UE support with min instability but with pt able to self-correct without physical assistance    General Comments        Pertinent Vitals/Pain Pain Assessment: No/denies pain    Home Living                      Prior Function            PT Goals (current goals can now be found in the care plan section) Progress towards PT goals: Progressing toward goals    Frequency    7X/week      PT Plan Current plan remains appropriate    Co-evaluation              AM-PAC PT "6 Clicks" Mobility   Outcome Measure  Help needed turning from your back to your side while in a flat bed without using bedrails?: None Help needed moving from lying on your back to sitting on the side of a flat bed without using bedrails?: None Help needed moving to and from a bed to a chair (including a wheelchair)?: A Little Help needed standing up from a chair using your arms (e.g., wheelchair or bedside chair)?: A Little Help needed to walk in hospital room?: A  Little Help needed climbing 3-5 steps with a railing? : A Little 6 Click Score: 20    End of Session Equipment Utilized During Treatment: Gait belt Activity Tolerance: Patient tolerated treatment well Patient left: in bed;with call bell/phone within reach;with bed alarm set;Other (comment)(Pt declined up in chair) Nurse Communication: Mobility status PT Visit Diagnosis: Unsteadiness on feet (R26.81);Other abnormalities of gait and mobility (R26.89);Muscle weakness (generalized) (M62.81);Difficulty in walking, not elsewhere classified (R26.2);Dizziness and giddiness (R42)     Time: 1420-1505 PT Time Calculation (min) (ACUTE ONLY): 45 min  Charges:  $Gait Training: 23-37 mins $Therapeutic Exercise: 8-22 mins                     D. Scott Jackquelyn Sundberg PT, DPT 01/17/19, 3:36 PM

## 2019-01-17 NOTE — Consult Note (Signed)
Subjective: Still complaint of binocular diplopia Past Medical History:  Diagnosis Date  . Abnormal Pap smear of cervix   . Arthritis   . Arthritis, rheumatoid ([place])   . Atypical chest pain   . Bipolar 1 disorder ([place])   . Headache    MIGRAINES  . Hyperlipidemia   . Hypertension   . Morbid obesity ([place])    a. 11/2018 s/p Robotic Sleeve Gastrectomy @ [facility].  Marland Kitchen Palpitations    a. 09/2017 Zio Monitor: predominantly RSR, avg HR 87, 11 A tach/SVT episodes, fastest 176 x 7 beats, longest 9 beats. Rare PAC's, occas PVC's, rare couplets/triplets.  . Persistent Nausea and vomiting    a. Following sleeve gastrectomy 11/2018  . PTSD (post-traumatic stress disorder)   . PVC (premature ventricular contraction)   . Social phobia     Past Surgical History:  Procedure Laterality Date  . CHOLECYSTECTOMY    . LAPAROSCOPIC GASTRIC SLEEVE RESECTION  11/19/2018  . LAPAROSCOPIC TUBAL LIGATION Bilateral 09/19/2015   Procedure: LAPAROSCOPIC BILATERAL TUBAL BANDING;  Surgeon: Brayton Mars, MD;  Location: [facility] ORS;  Service: Gynecology;  Laterality: Bilateral;    Family History  Problem Relation Age of Onset  . Diabetes Maternal Grandmother   . Heart disease Maternal Grandmother   . Heart failure Maternal Grandmother   . Heart attack Maternal Grandmother   . Rheum arthritis Mother   . Healthy Father   . Heart Problems Maternal Grandfather   . Atrial fibrillation Paternal Grandmother   . Cancer Neg Hx     Social History:  reports that she has never smoked. She has never used smokeless tobacco. She reports previous alcohol use. She reports that she does not use drugs.  No Known Allergies  Medications: I have reviewed the patient's current medications.  ROS: History obtained from the patient  General ROS: negative for - chills, fatigue, fever, night sweats, weight gain or weight loss Psychological ROS: negative for - behavioral disorder, hallucinations, memory difficulties, mood swings or  suicidal ideation Ophthalmic ROS: negative for - blurry vision, double vision, eye pain or loss of vision ENT ROS: negative for - epistaxis, nasal discharge, oral lesions, sore throat, tinnitus or vertigo Allergy and Immunology ROS: negative for - hives or itchy/watery eyes Hematological and Lymphatic ROS: negative for - bleeding problems, bruising or swollen lymph nodes Endocrine ROS: negative for - galactorrhea, hair pattern changes, polydipsia/polyuria or temperature intolerance Respiratory ROS: negative for - cough, hemoptysis, shortness of breath or wheezing Cardiovascular ROS: negative for - chest pain, dyspnea on exertion, edema or irregular heartbeat Gastrointestinal ROS: negative for - abdominal pain, diarrhea, hematemesis, nausea/vomiting or stool incontinence Genito-Urinary ROS: negative for - dysuria, hematuria, incontinence or urinary frequency/urgency Musculoskeletal ROS: negative for - joint swelling or muscular weakness Neurological ROS: as noted in HPI Dermatological ROS: negative for rash and skin lesion changes  Physical Examination: Blood pressure (!) 91/57, pulse 69, temperature 97.7 F (36.5 C), temperature source Oral, resp. rate 19, height 5\' 1"  (1.549 m), weight 116.9 kg, SpO2 100 %.    Neurological Examination   Mental Status: Alert, oriented, thought content appropriate.  Speech fluent without evidence of aphasia.  Able to follow 3 step commands without difficulty. Cranial Nerves: II: Discs flat bilaterally; Visual fields grossly normal, pupils equal, round, reactive to light and accommodation III,IV, XB:[id number] diplopia relieved with closure of either eye.  V,VII: smile symmetric, facial light touch sensation normal bilaterally VIII: hearing normal bilaterally IX,X: gag reflex present XI: bilateral shoulder shrug XII: midline  tongue extension Motor: Right : Upper extremity   5/5    Left:     Upper extremity   5/5  Lower extremity   5/5     Lower  extremity   5/5 Tone and bulk:normal tone throughout; no atrophy noted Sensory: Pinprick and light touch intact throughout, bilaterally Deep Tendon Reflexes: 1+ and symmetric throughout Plantars: Right: downgoing   Left: downgoing Cerebellar: Not tested Gait: not tested      Laboratory Studies:   Basic Metabolic Panel: Recent Labs  Lab 01/11/19 0416 01/11/19 1459 01/12/19 0409 01/15/19 1721 01/16/19 0535 01/17/19 0427  NA 141  --  142 137 138 141  K 3.1*  --  3.2* 3.1* 2.9* 3.3*  CL 104  --  106 97* 101 106  CO2 27  --  25 24 26 25   GLUCOSE 103*  --  79 91 88 85  BUN 7  --  <5* 6 8 10   CREATININE 0.57  --  0.53 0.68 0.54 0.59  CALCIUM 8.3*  --  8.3* 9.4 8.6* 8.7*  MG  --  1.9  --  1.8  --   --     Liver Function Tests: Recent Labs  Lab 01/10/19 1935 01/15/19 1721  AST 30 62*  ALT 28 67*  ALKPHOS 71 87  BILITOT 1.5* 1.6*  PROT 7.3 7.1  ALBUMIN 4.1 3.9   Recent Labs  Lab 01/10/19 1935  LIPASE 76*   No results for input(s): AMMONIA in the last 168 hours.  CBC: Recent Labs  Lab 01/10/19 1935 01/11/19 0416 01/15/19 1721 01/16/19 0535  WBC 10.3 10.2 11.4* 10.4  HGB 16.6* 14.4 16.5* 13.9  HCT 49.5* 43.6 49.5* 41.0  MCV 84.2 84.2 85.3 84.0  PLT 378 307 376 296    Cardiac Enzymes: No results for input(s): CKTOTAL, CKMB, CKMBINDEX, TROPONINI in the last 168 hours.  BNP: Invalid input(s): POCBNP  CBG: No results for input(s): GLUCAP in the last 168 hours.  Microbiology: Results for orders placed or performed during the hospital encounter of 01/15/19  SARS Coronavirus 2 (CEPHEID- Performed in [place] hospital lab), Hosp Order     Status: None   Collection Time: 01/16/19  4:10 AM   Specimen: Nasopharyngeal Swab  Result Value Ref Range Status   SARS Coronavirus 2 NEGATIVE NEGATIVE Final    Comment: (NOTE) If result is NEGATIVE SARS-CoV-2 target nucleic acids are NOT DETECTED. The SARS-CoV-2 RNA is generally detectable in upper and lower   respiratory specimens during the acute phase of infection. The lowest  concentration of SARS-CoV-2 viral copies this assay can detect is 250  copies / mL. A negative result does not preclude SARS-CoV-2 infection  and should not be used as the sole basis for treatment or other  patient management decisions.  A negative result may occur with  improper specimen collection / handling, submission of specimen other  than nasopharyngeal swab, presence of viral mutation(s) within the  areas targeted by this assay, and inadequate number of viral copies  (<250 copies / mL). A negative result must be combined with clinical  observations, patient history, and epidemiological information. If result is POSITIVE SARS-CoV-2 target nucleic acids are DETECTED. The SARS-CoV-2 RNA is generally detectable in upper and lower  respiratory specimens dur ing the acute phase of infection.  Positive  results are indicative of active infection with SARS-CoV-2.  Clinical  correlation with patient history and other diagnostic information is  necessary to determine patient infection status.  Positive  results do  not rule out bacterial infection or co-infection with other viruses. If result is PRESUMPTIVE POSTIVE SARS-CoV-2 nucleic acids MAY BE PRESENT.   A presumptive positive result was obtained on the submitted specimen  and confirmed on repeat testing.  While 2019 novel coronavirus  (SARS-CoV-2) nucleic acids may be present in the submitted sample  additional confirmatory testing may be necessary for epidemiological  and / or clinical management purposes  to differentiate between  SARS-CoV-2 and other Sarbecovirus currently known to infect humans.  If clinically indicated additional testing with an alternate test  methodology [phone]) is advised. The SARS-CoV-2 RNA is generally  detectable in upper and lower respiratory sp ecimens during the acute  phase of infection. The expected result is Negative. Fact  Sheet for Patients:  StrictlyIdeas.no Fact Sheet for Healthcare Providers: BankingDealers.co.za This test is not yet approved or cleared by the [place] FDA and has been authorized for detection and/or diagnosis of SARS-CoV-2 by FDA under an Emergency Use Authorization (EUA).  This EUA will remain in effect (meaning this test can be used) for the duration of the COVID-19 declaration under Section 564(b)(1) of the Act, 21 U.S.C. section 360bbb-3(b)(1), unless the authorization is terminated or revoked sooner. Performed at [facility], [address] [place], [place] [address]     Coagulation Studies: No results for input(s): LABPROT, INR in the last 72 hours.  Urinalysis:  Recent Labs  Lab 01/10/19 2243  COLORURINE AMBER*  LABSPEC 1.028  PHURINE 6.0  GLUCOSEU NEGATIVE  HGBUR MODERATE*  BILIRUBINUR MODERATE*  KETONESUR 80*  PROTEINUR 100*  NITRITE NEGATIVE  LEUKOCYTESUR SMALL*    Lipid Panel:     Component Value Date/Time   CHOL 108 01/16/2019 0535   TRIG 84 01/16/2019 0535   HDL 21 (L) 01/16/2019 0535   CHOLHDL 5.1 01/16/2019 0535   VLDL 17 01/16/2019 0535   LDLCALC 70 01/16/2019 0535   LDLCALC 93 10/14/2018 1125    HgbA1C:  Lab Results  Component Value Date   HGBA1C 5.8 (H) 10/14/2018    Urine Drug Screen:      Component Value Date/Time   LABOPIA NONE DETECTED 10/22/2016 1437   COCAINSCRNUR NONE DETECTED 10/22/2016 1437   LABBENZ NONE DETECTED 10/22/2016 1437   AMPHETMU NONE DETECTED 10/22/2016 1437   THCU NONE DETECTED 10/22/2016 1437   LABBARB NONE DETECTED 10/22/2016 1437    Alcohol Level: No results for input(s): ETH in the last 168 hours.  Other results: EKG: normal EKG, normal sinus rhythm, unchanged from previous tracings.  Imaging: Mr Brain Wo Contrast  Result Date: 01/15/2019 CLINICAL DATA:  Diplopia.  Double vision 8 days. EXAM: MRI HEAD WITHOUT CONTRAST TECHNIQUE:  Multiplanar, multiecho pulse sequences of the brain and surrounding structures were obtained without intravenous contrast. COMPARISON:  None. FINDINGS: Brain: Small area of diffusion weighted hyperintensity in the splenium of the corpus callosum. This shows low signal ADC consistent with restricted diffusion. This may be a subacute infarction. No other areas of restricted diffusion. Ventricle size normal. No other areas of infarction. Negative for hemorrhage or mass. No midline shift. Vascular: Normal arterial flow voids. 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 Otherwise negative. Electronically Signed   By: Franchot Gallo M.D.   On: 01/15/2019 20:43   US Carotid Bilateral  Result Date: 01/16/2019 CLINICAL DATA:  Double vision, small acute stroke EXAM: BILATERAL CAROTID DUPLEX  ULTRASOUND TECHNIQUE: Pearline Cables scale imaging, color Doppler and duplex ultrasound were performed of bilateral carotid and vertebral arteries in the neck. COMPARISON:  01/15/2019 FINDINGS: Criteria: Quantification of carotid stenosis is based on velocity parameters that correlate the residual internal carotid diameter with NASCET-based stenosis levels, using the diameter of the distal internal carotid lumen as the denominator for stenosis measurement. The following velocity measurements were obtained: RIGHT ICA: 84/30 cm/sec CCA: 90/24 cm/sec SYSTOLIC ICA/CCA RATIO:  1.1 ECA: 69 cm/sec LEFT ICA: 80/38 cm/sec CCA: 09/73 cm/sec SYSTOLIC ICA/CCA RATIO:  1.4 ECA: 66 cm/sec RIGHT CAROTID ARTERY: No significant plaque formation. No hemodynamically significant right ICA stenosis, velocity elevation, or turbulent flow. Degree of narrowing less than 50%. RIGHT VERTEBRAL ARTERY:  Antegrade LEFT CAROTID ARTERY: No significant plaque formation. No hemodynamically significant left ICA stenosis,  velocity elevation, or turbulent flow. LEFT VERTEBRAL ARTERY:  Antegrade IMPRESSION: No significant carotid atherosclerosis. No hemodynamically significant ICA stenosis. Degree of narrowing less than 50% bilaterally by ultrasound criteria. Patent antegrade vertebral flow bilaterally Electronically Signed   By: Jerilynn Mages.  Shick M.D.   On: 01/16/2019 09:24     Assessment/Plan:  33 y.o. female with PMHx of morbid obesity, hypertension, IBS-mixed, bipolar disorder, PTSD, depression, and recent robotic gastric sleeve (11/19/2018) was transferred from [facility] for concern of nausea/vomiting x6 weeks, and 2 weeks of dizziness, tinnitus, and vision changes.   On examination patient does have binocular diplopia that improves with closure of either eye.    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  - callosal connections interconnect homologous areas of visual cortices, binding together the two halves of the visual field hence the binocular diplopia resolves when closing either eye - Pt is appropriately started on ASA  - Vision should improve as there will be new connections formed  - No further testing neuro perspective.   - d/c planning potentially today from neuro perspective 01/17/2019, 8:46 AM

## 2019-01-17 NOTE — Progress Notes (Signed)
[place] [facility] [place] NAME: Endiya Klahr    MR#:  [id number]  DATE OF BIRTH:  [birth year]  SUBJECTIVE:  CHIEF COMPLAINT:   Chief Complaint  Patient presents with  . Diplopia   No new complaint this morning.  Patient still having some diplopia this morning.  Awaiting physical therapy reevaluation with recommendations today.  REVIEW OF SYSTEMS:  ROS Constitutional: Negative for chills, fever and malaise/fatigue.  HENT: Negative for congestion, ear discharge, hearing loss and nosebleeds.   Eyes: Positive for blurred vision and double vision.  Respiratory: Negative for cough, shortness of breath and wheezing.   Cardiovascular: Negative for chest pain and palpitations.  Gastrointestinal: Negative for abdominal pain, constipation, diarrhea and vomiting.  Genitourinary: Negative for dysuria.  Musculoskeletal: Negative for myalgias.  Neurological: Positive for dizziness. Negative for focal weakness, seizures, weakness and headaches.  Psychiatric/Behavioral: Negative for depression.  DRUG ALLERGIES:  No Known Allergies VITALS:  Blood pressure 104/72, pulse 68, temperature 97.8 F (36.6 C), temperature source Oral, resp. rate 17, height 5\' 1"  (1.549 m), weight 116.9 kg, SpO2 100 %. PHYSICAL EXAMINATION:  Physical Exam  GENERAL:  33 y.o.-year-old morbidly obese patient lying in the bed with no acute distress.  EYES: Pupils equal, round, reactive to light and accommodation. No scleral icterus. Extraocular muscles intact. No nystagmus HEENT: Head atraumatic, normocephalic. Oropharynx and nasopharynx clear.  NECK:  Supple, no jugular venous distention. No thyroid enlargement, no tenderness.  LUNGS: Normal breath sounds bilaterally, no wheezing, rales,rhonchi or crepitation. No use of accessory muscles of respiration.  CARDIOVASCULAR: S1, S2 normal. No murmurs, rubs, or gallops.  ABDOMEN: Soft, nontender, nondistended. Bowel sounds present. No  organomegaly or mass.  EXTREMITIES: No pedal edema, cyanosis, or clubbing.  NEUROLOGIC: Cranial nerves II through XII are intact. Muscle strength 5/5 in all extremities. Sensation intact. Gait not checked.  PSYCHIATRIC: The patient is alert and oriented x 3.  SKIN: No obvious rash, lesion, or ulcer.   LABORATORY PANEL:  Female CBC Recent Labs  Lab 01/16/19 0535  WBC 10.4  HGB 13.9  HCT 41.0  PLT 296   ------------------------------------------------------------------------------------------------------------------ Chemistries  Recent Labs  Lab 01/15/19 1721  01/17/19 0427  NA 137   < > 141  K 3.1*   < > 3.3*  CL 97*   < > 106  CO2 24   < > 25  GLUCOSE 91   < > 85  BUN 6   < > 10  CREATININE 0.68   < > 0.59  CALCIUM 9.4   < > 8.7*  MG 1.8  --   --   AST 62*  --   --   ALT 67*  --   --   ALKPHOS 87  --   --   BILITOT 1.6*  --   --    < > = values in this interval not displayed.   RADIOLOGY:  No results found. ASSESSMENT AND PLAN:   33 year old female with past medical history significant for morbid obesity, hypertension, IBS, PTSD, recent sleeve gastrectomy done on 11/19/2018 oh was just discharged from [facility] after symptomatic treatment for her nausea vomiting comes to hospital secondary to worsening blurred vision.  1.  Diplopia-internal ophthalmoplegia, per neurology- secondary to corpus callosum small infarct (stroke) -MRI shows a small stroke in the corpus callosum area  -Patient also has symptoms of dizziness, vertigo no nystagmus. -Agree with neurology consult. -IV thiamine for 2 days followed by oral thiamine- concern for  wernicke's and malnutrition given recent bariatric surgery and nausea vomiting -Recently received B12 injection last week.  Continue oral supplements -Reevaluated by neurologist today.  Continue with aspirin and statins.  No further neuro testing indicated. Awaiting physical therapy evaluation to determine discharge plans.  2.  Subacute small  stroke-on aspirin and statin 2D echocardiogram with ejection fraction of 60 to 65%.  Carotid Doppler ultrasound with no evidence of any hemodynamically significant stenosis. Follow-up on physical therapy evaluation and recommendations.  3.  Hypokalemia-secondary to GI losses.  Being replaced  4.  Subacute intractable nausea-secondary to recent gastric sleeve procedure 2 months ago, followed by a postprocedural endoscopy with stricture dilatation 4 weeks ago.  Recent admission to here and also at [facility] and symptomatic treatment of nausea vomiting recommended -Continue Zyprexa that was added for nausea. -Symptoms improved  5.  GERD-Protonix  6.  Sinus tachycardia  Patient evaluated by cardiologist.  Heart rate was running up to 140s.  Patient already started on propranolol 60 mg daily.  Heart rate reported to go up with walking with physical therapy.  No further testing recommended.  Follow-up with cardiologist as outpatient.  DVT prophylaxis-Lovenox  All the records are reviewed and case discussed with Care Management/Social Worker. Management plans discussed with the patient, family and they are in agreement.  CODE STATUS: Full Code  TOTAL TIME TAKING CARE OF THIS PATIENT: 35 minutes.   More than 50% of the time was spent in counseling/coordination of care: YES  POSSIBLE D/C IN 1 DAY, DEPENDING ON CLINICAL CONDITION.   Vallarie Fei M.D on 01/17/2019 at 12:55 PM  Between 7am to 6pm - Pager - [phone]  After 6pm go to [email or web address] - Proofreader  [facility]  Office  [phone]  CC: Primary care physician; Olin Hauser, DO  Note: This dictation was prepared with Dragon dictation along with smaller phrase technology. Any transcriptional errors that result from this process are unintentional.

## 2019-01-18 LAB — BASIC METABOLIC PANEL
Anion gap: 5 (ref 5–15)
BUN: 10 mg/dL (ref 6–20)
CO2: 28 mmol/L (ref 22–32)
Calcium: 8.6 mg/dL — ABNORMAL LOW (ref 8.9–10.3)
Chloride: 109 mmol/L (ref 98–111)
Creatinine, Ser: 0.73 mg/dL (ref 0.44–1.00)
GFR calc Af Amer: >60 mL/min (ref >60)
GFR calc non Af Amer: >60 mL/min (ref >60)
Glucose, Bld: 97 mg/dL (ref 70–99)
Potassium: 3.4 mmol/L — ABNORMAL LOW (ref 3.5–5.1)
Sodium: 142 mmol/L (ref 135–145)

## 2019-01-18 LAB — MAGNESIUM: Magnesium: 1.8 mg/dL (ref 1.7–2.4)

## 2019-01-18 MED ORDER — CYANOCOBALAMIN 500 MCG PO TABS: 500.0000 ug | ORAL_TABLET | Freq: Every day | ORAL | 0 refills | Status: DC

## 2019-01-18 MED ORDER — ASPIRIN 325 MG PO TBEC: 325.0000 mg | DELAYED_RELEASE_TABLET | Freq: Every day | ORAL | 0 refills | Status: DC

## 2019-01-18 MED ORDER — PROMETHAZINE HCL 12.5 MG PO TABS: 12.5000 mg | ORAL_TABLET | Freq: Four times a day (QID) | ORAL | 0 refills | Status: DC | PRN

## 2019-01-18 MED ORDER — ATORVASTATIN CALCIUM 40 MG PO TABS: 40.0000 mg | ORAL_TABLET | Freq: Every day | ORAL | 0 refills | Status: DC

## 2019-01-18 MED ORDER — OLANZAPINE 5 MG PO TBDP: 2.5000 mg | ORAL_TABLET | Freq: Two times a day (BID) | ORAL | 0 refills | Status: DC

## 2019-01-18 MED ORDER — POTASSIUM CHLORIDE CRYS ER 20 MEQ PO TBCR
40.0000 meq | EXTENDED_RELEASE_TABLET | Freq: Once | ORAL | Status: AC
  Administered 2019-01-18: 40 meq via ORAL
  Filled 2019-01-18: qty 2

## 2019-01-18 NOTE — Progress Notes (Signed)
Pt is being discharged home with [facility].  Discharge papers given and explained to pt.  Pt verbalized understanding. Meds and f/u appointments reviewed. Rx given. Awaiting transportation.

## 2019-01-18 NOTE — TOC Transition Note (Signed)
Transition of Care [facility]) - CM/SW Discharge Note   Patient Details  Name: Leah Osborne MRN: [id number] Date of Birth: [birth year]  Transition of Care [facility]) CM/SW Contact:  Latanya Maudlin, RN Phone Number: 01/18/2019, 3:24 PM   Clinical Narrative:   Per MD patient to be discharged home. There are no orders for Home health services. PT originally recommended home health but patient refused home health participation today because she voiced no need for it and she felt she had progressed. Voicemail left for patient on how to get home health through PCP if that changes.     Final next level of care: Home/Self Care Barriers to Discharge: No Barriers Identified   Patient Goals and CMS Choice        Discharge Placement                       Discharge Plan and Services                                     Social Determinants of Health (SDOH) Interventions     Readmission Risk Interventions No flowsheet data found.

## 2019-01-18 NOTE — Plan of Care (Signed)

## 2019-01-18 NOTE — Progress Notes (Signed)
PT Cancellation Note  Patient Details Name: Leah Osborne MRN: [id number] DOB: January 08, 1986   Cancelled Treatment:    Reason Eval/Treat Not Completed: Other (comment)   Discharge orders in this am.  Stopped to see pt and she stated she is comfortable with mobility and has no further concerns at this time in regards to therapy.  Declined intervention this am.   Chesley Noon 01/18/2019, 12:30 PM

## 2019-01-18 NOTE — Discharge Summary (Signed)
[place] [facility] [place]: Leah Osborne    MR#:  [id number]  [place]:  [birth year]  DATE OF ADMISSION:  01/15/2019   ADMITTING PHYSICIAN: Dustin Flock, MD  DATE OF DISCHARGE: 01/18/2019  PRIMARY CARE PHYSICIAN: Olin Hauser, DO   ADMISSION DIAGNOSIS:  CVA (cerebral vascular accident) [facility]) [I63.9] Cerebrovascular accident (CVA), unspecified mechanism ([place]) [I63.9] DISCHARGE DIAGNOSIS:  Active Problems:   Double vision  SECONDARY DIAGNOSIS:   Past Medical History:  Diagnosis Date   Abnormal Pap smear of cervix    Arthritis    Arthritis, rheumatoid (HCC)    Atypical chest pain    Bipolar 1 disorder ([place])    Headache    MIGRAINES   Hyperlipidemia    Hypertension    Morbid obesity ([place])    a. 11/2018 s/p Robotic Sleeve Gastrectomy @ [facility].   Palpitations    a. 09/2017 Zio Monitor: predominantly RSR, avg HR 87, 11 A tach/SVT episodes, fastest 176 x 7 beats, longest 9 beats. Rare PAC's, occas PVC's, rare couplets/triplets.   Persistent Nausea and vomiting    a. Following sleeve gastrectomy 11/2018   PTSD (post-traumatic stress disorder)    PVC (premature ventricular contraction)    Social phobia    HOSPITAL COURSE:  Chief complaint; double vision   History of presenting complaint; Leah Osborne  is a 33 y.o. female 33 year old female with PMHx of morbid obesity, hypertension, IBS-mixed, bipolar disorder, PTSD, depression, and recent robotic gastric sleeve (11/19/2018) was transferred from [facility] for concern of nausea/vomiting x6 weeks, and 2 weeks of dizziness, tinnitus, and vision changes.  Patient was initially hospitalized at [facility] and was in the hospital for 4 days.  Then subsequently transferred to [facility].  Patient was discharged from [facility] 1 day prior to this admission. There she complained of double vision.  They told her that she needed to follow-up with her ophthalmologist.  Patient was  seen by Dr. George Ina on the day of admission states the patient has bilateral ophthalmoplegia affecting her cranial nerves. Sent the patient to the ED today for further evaluation, Also states that she has had an unsteady gait.  MRI in the ER showed a possible small subacute CVA.    Hospital course; 1. Subacute CVA . Patient presented with diplopia-internal ophthalmoplegia, per neurology- secondary to corpus callosum small infarct (stroke). MRI showed a small stroke in the corpus callosum area  Patient also has symptoms of dizziness, vertigo no nystagmus. 2D echocardiogram with ejection fraction of 60 to 65%.  Carotid Doppler ultrasound with no evidence of any hemodynamically significant stenosis. Symptoms have all significantly improved.  Patient seen by physical therapist and did well yesterday.  Recommendation is for home health with physical therapy on discharge.  Patient was also treated with IV thiamine initially and transition to p.o. thiamine during this admission due to concern for wernicke's and malnutritiongiven recent bariatric surgery and nausea vomiting.  Symptoms have all completely resolved.Recently received B12 injection last week. Continue oral supplements. Reevaluated by neurologist yesterday.  Continue with aspirin and statins.  No further neuro testing indicated.  Patient clinically and hemodynamically stable for discharge home today.   2. Hypokalemia-secondary to GI losses. Being replaced  3. Subacute intractable nausea-secondary to recent gastric sleeve procedure 2 months ago, followed by a postprocedural endoscopy with stricture dilatation 4 weeks ago. Recent admission to here and also at [facility] and symptomatic treatment of nausea vomiting recommended -Continue Zyprexa that was added for nausea. -Symptoms  improved  4. GERD-Protonix  5.  Sinus tachycardia Patient evaluated by cardiologist.  Heart rate was running up to 140s.  Patient already started on propranolol 60  mg daily.    Heart rate better controlled this morning.  Appointment made to follow-up with cardiologist as outpatient.  No further testing recommended by cardiologist at this time.  TSH level was normal.   DISCHARGE CONDITIONS:  Stable CONSULTS OBTAINED:  Treatment Team:  Leotis Pain, MD DRUG ALLERGIES:  No Known Allergies DISCHARGE MEDICATIONS:   Allergies as of 01/18/2019   No Known Allergies     Medication List    TAKE these medications   aspirin 325 MG EC tablet Take 1 tablet (325 mg total) by mouth daily. Start taking on: January 19, 2019   atorvastatin 40 MG tablet Commonly known as: LIPITOR Take 1 tablet (40 mg total) by mouth daily at 6 PM.   esomeprazole 40 MG capsule Commonly known as: NEXIUM Take 40 mg by mouth daily.   levothyroxine 25 MCG tablet Commonly known as: SYNTHROID Take 1 tablet (25 mcg total) by mouth daily before breakfast.   promethazine 12.5 MG tablet Commonly known as: PHENERGAN Take 1 tablet (12.5 mg total) by mouth every 6 (six) hours as needed for nausea or vomiting.   propranolol ER 60 MG 24 hr capsule Commonly known as: INDERAL LA Take 1 capsule (60 mg total) by mouth daily.   vitamin B-12 500 MCG tablet Commonly known as: CYANOCOBALAMIN Take 1 tablet (500 mcg total) by mouth daily. Start taking on: January 19, 2019      Zyprexa 2.5 mg p.o. twice daily;   DISCHARGE INSTRUCTIONS:   DIET:  Cardiac diet DISCHARGE CONDITION:  Stable ACTIVITY:  Activity as tolerated OXYGEN:  Home Oxygen: No.  Oxygen Delivery: room air DISCHARGE LOCATION:  home   If you experience worsening of your admission symptoms, develop shortness of breath, life threatening emergency, suicidal or homicidal thoughts you must seek medical attention immediately by calling 911 or calling your MD immediately  if symptoms less severe.  You Must read complete instructions/literature along with all the possible adverse reactions/side effects for all the  Medicines you take and that have been prescribed to you. Take any new Medicines after you have completely understood and accpet all the possible adverse reactions/side effects.   Please note  You were cared for by a hospitalist during your hospital stay. If you have any questions about your discharge medications or the care you received while you were in the hospital after you are discharged, you can call the unit and asked to speak with the hospitalist on call if the hospitalist that took care of you is not available. Once you are discharged, your primary care physician will handle any further medical issues. Please note that NO REFILLS for any discharge medications will be authorized once you are discharged, as it is imperative that you return to your primary care physician (or establish a relationship with a primary care physician if you do not have one) for your aftercare needs so that they can reassess your need for medications and monitor your lab values.    On the day of Discharge:  VITAL SIGNS:  Blood pressure 106/71, pulse 76, temperature 97.6 F (36.4 C), temperature source Oral, resp. rate 18, height 5\' 1"  (1.549 m), weight 116.9 kg, SpO2 99 %. PHYSICAL EXAMINATION:  GENERAL:  33 y.o.-year-old patient lying in the bed with no acute distress.  EYES: Pupils equal, round, reactive to light  and accommodation. No scleral icterus. Extraocular muscles intact.  HEENT: Head atraumatic, normocephalic. Oropharynx and nasopharynx clear.  NECK:  Supple, no jugular venous distention. No thyroid enlargement, no tenderness.  LUNGS: Normal breath sounds bilaterally, no wheezing, rales,rhonchi or crepitation. No use of accessory muscles of respiration.  CARDIOVASCULAR: S1, S2 normal. No murmurs, rubs, or gallops.  ABDOMEN: Soft, non-tender, non-distended. Bowel sounds present. No organomegaly or mass.  EXTREMITIES: No pedal edema, cyanosis, or clubbing.  NEUROLOGIC: Cranial nerves II through XII are  intact. Muscle strength 5/5 in all extremities. Sensation intact. Gait not checked.  PSYCHIATRIC: The patient is alert and oriented x 3.  SKIN: No obvious rash, lesion, or ulcer.  DATA REVIEW:   CBC Recent Labs  Lab 01/16/19 0535  WBC 10.4  HGB 13.9  HCT 41.0  PLT 296    Chemistries  Recent Labs  Lab 01/15/19 1721  01/18/19 0549  NA 137   < > 142  K 3.1*   < > 3.4*  CL 97*   < > 109  CO2 24   < > 28  GLUCOSE 91   < > 97  BUN 6   < > 10  CREATININE 0.68   < > 0.73  CALCIUM 9.4   < > 8.6*  MG 1.8  --  1.8  AST 62*  --   --   ALT 67*  --   --   ALKPHOS 87  --   --   BILITOT 1.6*  --   --    < > = values in this interval not displayed.     Microbiology Results  Results for orders placed or performed during the hospital encounter of 01/15/19  SARS Coronavirus 2 (CEPHEID- Performed in [place] hospital lab), Hosp Order     Status: None   Collection Time: 01/16/19  4:10 AM   Specimen: Nasopharyngeal Swab  Result Value Ref Range Status   SARS Coronavirus 2 NEGATIVE NEGATIVE Final    Comment: (NOTE) If result is NEGATIVE SARS-CoV-2 target nucleic acids are NOT DETECTED. The SARS-CoV-2 RNA is generally detectable in upper and lower  respiratory specimens during the acute phase of infection. The lowest  concentration of SARS-CoV-2 viral copies this assay can detect is 250  copies / mL. A negative result does not preclude SARS-CoV-2 infection  and should not be used as the sole basis for treatment or other  patient management decisions.  A negative result may occur with  improper specimen collection / handling, submission of specimen other  than nasopharyngeal swab, presence of viral mutation(s) within the  areas targeted by this assay, and inadequate number of viral copies  (<250 copies / mL). A negative result must be combined with clinical  observations, patient history, and epidemiological information. If result is POSITIVE SARS-CoV-2 target nucleic acids are  DETECTED. The SARS-CoV-2 RNA is generally detectable in upper and lower  respiratory specimens dur ing the acute phase of infection.  Positive  results are indicative of active infection with SARS-CoV-2.  Clinical  correlation with patient history and other diagnostic information is  necessary to determine patient infection status.  Positive results do  not rule out bacterial infection or co-infection with other viruses. If result is PRESUMPTIVE POSTIVE SARS-CoV-2 nucleic acids MAY BE PRESENT.   A presumptive positive result was obtained on the submitted specimen  and confirmed on repeat testing.  While 2019 novel coronavirus  (SARS-CoV-2) nucleic acids may be present in the submitted sample  additional confirmatory  testing may be necessary for epidemiological  and / or clinical management purposes  to differentiate between  SARS-CoV-2 and other Sarbecovirus currently known to infect humans.  If clinically indicated additional testing with an alternate test  methodology [phone]) is advised. The SARS-CoV-2 RNA is generally  detectable in upper and lower respiratory sp ecimens during the acute  phase of infection. The expected result is Negative. Fact Sheet for Patients:  StrictlyIdeas.no Fact Sheet for Healthcare Providers: BankingDealers.co.za This test is not yet approved or cleared by the [place] FDA and has been authorized for detection and/or diagnosis of SARS-CoV-2 by FDA under an Emergency Use Authorization (EUA).  This EUA will remain in effect (meaning this test can be used) for the duration of the COVID-19 declaration under Section 564(b)(1) of the Act, 21 U.S.C. section 360bbb-3(b)(1), unless the authorization is terminated or revoked sooner. Performed at [facility], [address], [place], [place] [address]     RADIOLOGY:  No results found.   Management plans discussed with the patient, family and  they are in agreement.  CODE STATUS: Full Code   TOTAL TIME TAKING CARE OF THIS PATIENT: 36 minutes.    Brystol Wasilewski M.D on 01/18/2019 at 10:58 AM  Between 7am to 6pm - Pager - [phone]  After 6pm go to [email or web address] - Proofreader  [facility] [place] [facility]  Office  [phone]  CC: Primary care physician; Olin Hauser, DO   Note: This dictation was prepared with Dragon dictation along with smaller phrase technology. Any transcriptional errors that result from this process are unintentional.

## 2019-01-20 DIAGNOSIS — Z8673 Personal history of transient ischemic attack (TIA), and cerebral infarction without residual deficits: Secondary | ICD-10-CM | POA: Diagnosis not present

## 2019-01-20 DIAGNOSIS — M459 Ankylosing spondylitis of unspecified sites in spine: Secondary | ICD-10-CM | POA: Diagnosis not present

## 2019-01-20 DIAGNOSIS — Z6841 Body Mass Index (BMI) 40.0 and over, adult: Secondary | ICD-10-CM | POA: Diagnosis not present

## 2019-01-20 LAB — VITAMIN B1: Vitamin B1 (Thiamine): 53.7 nmol/L — ABNORMAL LOW (ref 66.5–200.0)

## 2019-01-20 NOTE — Unmapped (Signed)
Rheumatology Remote Clinic Follow-up Appointment     Patient self identified using name and date of birth.    Patient location: Home.     Last clinic visit: Nov 2019     Reason for encounter: Follow-up low back pain    HISTORY: Ms. Jacqueline Hall is a 33 y.o. female with a history of morbid obesity who presents for follow up of??low back pain and??possible ankylosing spondylitis. She was initially evaluated by myself in March 2018. She resumed Humira in June 2018 at the recommendation of her previous Rheumatologist, but self-discontinued after two doses due to inefficacy and loss of insurance. Previous MRI showed no active sacroiliitis. Started Humira spring 2020.      Interval History:  - Underwent bariatric surgery June 2020  - She started Humira spring 2020, she feels it was helping back pain.  Completed about 8 doses then held for surgery and has not resumed given some complications of N/V, then she reports stroke on 7/30, seen at [facility], now on aspirin and lipitor, presented with double vision and weakness. Has fu with neuro 9/3 at [facility].    - She feels it helped, though not completely, still had pain.  Less stiffness while on Humira, more stiffness now. Feels stiffness was getting better prior to surgery.    Objective    Physical Exam:  GENERAL: The patient does not sound to be in acute distress.   Respiratory: Speaks in full sentences. No cough.   PSYCH: No depression or anxiety. Cooperative. Alert and oriented.     01/16/2019 Echo: Impression   1. The left ventricle has normal systolic function with an ejection fraction of 60-65%. The cavity size was normal. Left ventricular diastolic parameters were normal.   2. The right ventricle has normal systolic function. The cavity was normal. There is no increase in right ventricular wall thickness. Right ventricular systolic pressure is mildly elevated with an estimated pressure of 33.6 mmHg.   3. Rare PVCs    01/15/2019 MRI Brain: IMPRESSION:  Small focus of restricted diffusion splenium of corpus callosum most consistent with acute or subacute infarction. Otherwise negative.    ASSESSMENT/PLAN:  Jacqueline Hall is a 33 y.o. female with history of morbid obesity who is being seen for longstanding low back pain. She has sacroiliitis on imaging, however repeat MRI in July 2019 does not show evidence of active sacroiliitis similar to MRI in April 2018. However, she does have longstanding low back pain with morning stiffness improved with activity and also seems to have improved with Humira pointing towards spondyloarthritis.     Spondyloarthritis: Does seem to have had some improvement on Humira, though was only on a few months then held for bariatric surgery.  Has not resumed due to complications post-op including n/v and then CVA.  - Recommend pt wait until after she sees neuro in 1 month before resuming Humira.  - Discussed with pt it does seem she benefited from Humira, pain not completely resolved and may have multiple etiologies contributing to pain including mechanical as well as inflammatory arthritis from SpA.     Follow-up: 3-4 months with Carlus Pavlov PA-c, 8-9 months with me.     Danella Maiers, MD, MSCI  Assistant Professor of Medicine  Department of Medicine/Division of Rheumatology  [place] [facility] [place] [facility]  [phone] clinic phone  [phone] clinic secure fax       I spent 14 minutes on the phone with the patient. I spent an  additional 10 minutes on pre- and post-visit activities.     The patient was physically located in [place] or a state in which I am permitted to provide care. The patient and/or parent/guardian understood that s/he may incur co-pays and cost sharing, and agreed to the telemedicine visit. The visit was reasonable and appropriate under the circumstances given the patient's presentation at the time.    The patient and/or parent/guardian has been advised of the potential risks and limitations of this mode of treatment (including, but not limited to, the absence of in-person examination) and has agreed to be treated using telemedicine. The patient's/patient's family's questions regarding telemedicine have been answered.     If the visit was completed in an ambulatory setting, the patient and/or parent/guardian has also been advised to contact their provider???s office for worsening conditions, and seek emergency medical treatment and/or call 911 if the patient deems either necessary.

## 2019-01-27 ENCOUNTER — Encounter: Payer: Self-pay | Admitting: Family Medicine

## 2019-01-27 ENCOUNTER — Other Ambulatory Visit: Payer: Self-pay

## 2019-01-27 ENCOUNTER — Ambulatory Visit (INDEPENDENT_AMBULATORY_CARE_PROVIDER_SITE_OTHER): Payer: BC Managed Care – PPO | Admitting: Family Medicine

## 2019-01-27 VITALS — BP 122/88 | HR 92 | Temp 98.5°F | Resp 16 | Ht 61.0 in | Wt 263.8 lb

## 2019-01-27 DIAGNOSIS — R112 Nausea with vomiting, unspecified: Secondary | ICD-10-CM

## 2019-01-27 DIAGNOSIS — E559 Vitamin D deficiency, unspecified: Secondary | ICD-10-CM

## 2019-01-27 DIAGNOSIS — E5111 Dry beriberi: Secondary | ICD-10-CM | POA: Insufficient documentation

## 2019-01-27 DIAGNOSIS — E876 Hypokalemia: Secondary | ICD-10-CM

## 2019-01-27 DIAGNOSIS — I6930 Unspecified sequelae of cerebral infarction: Secondary | ICD-10-CM

## 2019-01-27 DIAGNOSIS — Z9884 Bariatric surgery status: Secondary | ICD-10-CM | POA: Diagnosis not present

## 2019-01-27 DIAGNOSIS — H532 Diplopia: Secondary | ICD-10-CM

## 2019-01-27 DIAGNOSIS — E538 Deficiency of other specified B group vitamins: Secondary | ICD-10-CM

## 2019-01-27 MED ORDER — VITAMIN B-1 100 MG PO TABS: ORAL_TABLET | ORAL | 1 refills | Status: DC

## 2019-01-27 NOTE — Progress Notes (Signed)
Subjective:    Patient ID: Leah Osborne, female    DOB: [birth year], 31 y.o.   MRN: [id number]  Leah Osborne is a 33 y.o. female presenting on 01/27/2019 for Hospitalization Follow-up (CVA)   [place] VISIT  Hospital/Location: [facility] Date of Admission: 01/15/19 Date of Discharge: 01/18/19 Transitions of care telephone call: Not completed.  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 Brief summary of recent course, patient had symptoms of persistent nausea vomiting and dizziness, complicated course following gastric sleeve surgery at [facility] bariatric. See prior notes on course. Now prior to hospital went to Ophthalmology, dx with diplopia, advised concern for CVA, sent to hospital, had imaging confirmed, thought to be related to possible nutrition deficit B1 deficiency possible wernicke syndrome. See course, neurologist involved, started on statin, aspirin, MRI  - Today reports overall has done well after discharge. Symptoms of nausea vomiting have resolved or improved. She has had residual symptoms with double vision still, improving gradual wearing eyepatch or covering 1 eye, per ophthalmology instructions. She has residual weakness on R side of her body as reported. - Next apt with Neurology initial consult outpatient Dr Baxter Flattery in about 3-4 weeks, 02/2019 - Given Vitamin B1 in hospital, not discharged on it - had imaging work up [place] - had hypokalemia previously, improved - On Zyprexa for nausea as advised  - New medications on discharge: Aspirin, Statin  Currently also on FMLA, out since 11/19/18 due to bariatric surgery, was anticipated return 01/26/19, however she asks about extension due to CVA now.  I have  reviewed the discharge medication list, and have reconciled the current and discharge medications today.  Depression screen [facility] 2/9 01/27/2019 01/08/2019 10/21/2018  Decreased Interest 0 0 0  Down, Depressed, Hopeless 0 0 0  PHQ - 2 Score 0 0 0  Altered sleeping - - 0  Tired, decreased energy - - 0  Change in appetite - - 0  Feeling bad or failure about yourself  - - 0  Trouble concentrating - - 0  Moving slowly or fidgety/restless - - 2  Suicidal thoughts - - 0  PHQ-9 Score - - 2  Difficult doing work/chores - - Somewhat difficult    Past Medical History:  Diagnosis Date  . Abnormal Pap smear of cervix   . Arthritis   . Arthritis, rheumatoid ([place])   . Atypical chest pain   . Bipolar 1 disorder ([place])   . Headache    MIGRAINES  . Hyperlipidemia   . Hypertension   . Morbid obesity ([place])    a. 11/2018 s/p Robotic Sleeve Gastrectomy @ [facility].  Marland Kitchen Palpitations    a. 09/2017 Zio Monitor: predominantly RSR, avg HR 87, 11 A tach/SVT episodes, fastest 176 x 7 beats, longest 9 beats. Rare PAC's, occas PVC's, rare couplets/triplets.  . Persistent Nausea and vomiting    a. Following sleeve gastrectomy 11/2018  . PTSD (post-traumatic stress disorder)   . PVC (premature ventricular contraction)   . Social phobia    Past Surgical History:  Procedure Laterality Date  . CHOLECYSTECTOMY    . LAPAROSCOPIC GASTRIC SLEEVE RESECTION  11/19/2018  . LAPAROSCOPIC TUBAL LIGATION Bilateral 09/19/2015   Procedure: LAPAROSCOPIC BILATERAL TUBAL BANDING;  Surgeon: Brayton Mars, MD;  Location: [facility] ORS;  Service:  Gynecology;  Laterality: Bilateral;   Social History   Socioeconomic History  . Marital status: Married    Spouse name: Not on file  . Number of children: Not on file  . Years of education: Not on file  . Highest education level: Not on file  Occupational History  . Not on file  Social Needs  . Financial resource strain: Not on file  . Food insecurity    Worry: Not on file     Inability: Not on file  . Transportation needs    Medical: Not on file    Non-medical: Not on file  Tobacco Use  . Smoking status: Never Smoker  . Smokeless tobacco: Never Used  Substance and Sexual Activity  . Alcohol use: Not Currently    Alcohol/week: 0.0 standard drinks  . Drug use: No  . Sexual activity: Yes    Birth control/protection: Surgical    Comment: tubal  Lifestyle  . Physical activity    Days per week: Not on file    Minutes per session: Not on file  . Stress: Not on file  Relationships  . Social Herbalist on phone: Not on file    Gets together: Not on file    Attends religious service: Not on file    Active member of club or organization: Not on file    Attends meetings of clubs or organizations: Not on file    Relationship status: Not on file  . Intimate partner violence    Fear of current or ex partner: Not on file    Emotionally abused: Not on file    Physically abused: Not on file    Forced sexual activity: Not on file  Other Topics Concern  . Not on file  Social History Narrative   Lives in [place].   Family History  Problem Relation Age of Onset  . Diabetes Maternal Grandmother   . Heart disease Maternal Grandmother   . Heart failure Maternal Grandmother   . Heart attack Maternal Grandmother   . Rheum arthritis Mother   . Healthy Father   . Heart Problems Maternal Grandfather   . Atrial fibrillation Paternal Grandmother   . Cancer Neg Hx    Current Outpatient Medications on File Prior to Visit  Medication Sig  . aspirin EC 325 MG EC tablet Take 1 tablet (325 mg total) by mouth daily.  Marland Kitchen atorvastatin (LIPITOR) 40 MG tablet Take 1 tablet (40 mg total) by mouth daily at 6 PM.  . desipramine (NORPRAMIN) 25 MG tablet TAKE 0.5 TABLETS (12.5 MG TOTAL) BY MOUTH NIGHTLY.  Marland Kitchen esomeprazole (NEXIUM) 40 MG capsule Take 40 mg by mouth daily.   Marland Kitchen levothyroxine (SYNTHROID, LEVOTHROID) 25 MCG tablet Take 1 tablet (25 mcg total) by mouth daily  before breakfast.  . OLANZapine zydis (ZYPREXA) 5 MG disintegrating tablet Take 0.5 tablets (2.5 mg total) by mouth 2 (two) times a day.  . propranolol ER (INDERAL LA) 60 MG 24 hr capsule Take 1 capsule (60 mg total) by mouth daily.  . vitamin B-12 (CYANOCOBALAMIN) 500 MCG tablet Take 1 tablet (500 mcg total) by mouth daily.   No current facility-administered medications on file prior to visit.     Review of Systems Per HPI unless specifically indicated above      Objective:    BP 122/88   Pulse 92   Temp 98.5 F (36.9 C) (Oral)   Resp 16   Ht 5\' 1"  (1.549 m)   Wt  263 lb 12.8 oz (119.7 kg)   BMI 49.84 kg/m   Wt Readings from Last 3 Encounters:  01/27/19 263 lb 12.8 oz (119.7 kg)  01/15/19 257 lb 11.5 oz (116.9 kg)  01/10/19 258 lb (117 kg)    Physical Exam Vitals signs and nursing note reviewed.  Constitutional:      General: She is not in acute distress.    Appearance: She is well-developed. She is not diaphoretic.     Comments: Well-appearing, comfortable, cooperative, obese  HENT:     Head: Normocephalic and atraumatic.  Eyes:     General:        Left eye: No discharge.     Conjunctiva/sclera: Conjunctivae normal.     Comments: Right Eye covered by patch today due to diplopia  Neck:     Musculoskeletal: Normal range of motion and neck supple.     Thyroid: No thyromegaly.  Cardiovascular:     Rate and Rhythm: Normal rate and regular rhythm.     Heart sounds: Normal heart sounds. No murmur.  Pulmonary:     Effort: Pulmonary effort is normal. No respiratory distress.     Breath sounds: Normal breath sounds. No wheezing or rales.  Musculoskeletal: Normal range of motion.  Lymphadenopathy:     Cervical: No cervical adenopathy.  Skin:    General: Skin is warm and dry.     Findings: No erythema or rash.  Neurological:     General: No focal deficit present.     Mental Status: She is alert and oriented to person, place, and time.     Cranial Nerves: No cranial  nerve deficit.     Sensory: No sensory deficit.  Psychiatric:        Behavior: Behavior normal.     Comments: Well groomed, good eye contact, normal speech and thoughts    I have personally reviewed the radiology report from 01/15/19 MRI Brain.  CLINICAL DATA:  Diplopia.  Double vision 8 days.  EXAM: MRI HEAD WITHOUT CONTRAST  TECHNIQUE: Multiplanar, multiecho pulse sequences of the brain and surrounding structures were obtained without intravenous contrast.  COMPARISON:  None.  FINDINGS: Brain: Small area of diffusion weighted hyperintensity in the splenium of the corpus callosum. This shows low signal ADC consistent with restricted diffusion. This may be a subacute infarction. No other areas of restricted diffusion.  Ventricle size normal. No other areas of infarction. Negative for hemorrhage or mass. No midline shift.  Vascular: Normal arterial flow voids.  Skull and upper cervical spine: Negative  Sinuses/Orbits: Negative  Other: None  IMPRESSION: Small focus of restricted diffusion splenium of corpus callosum most consistent with acute or subacute infarction. Otherwise negative.   Electronically Signed   By: Franchot Gallo M.D.   On: 01/15/2019 20:43  Results for orders placed or performed during the hospital encounter of 01/15/19  SARS Coronavirus 2 (CEPHEID- Performed in [place] hospital lab), [facility] Order   Specimen: Nasopharyngeal Swab  Result Value Ref Range   SARS Coronavirus 2 NEGATIVE NEGATIVE  CBC  Result Value Ref Range   WBC 11.4 (H) 4.0 - 10.5 K/uL   RBC 5.80 (H) 3.87 - 5.11 MIL/uL   Hemoglobin 16.5 (H) 12.0 - 15.0 g/dL   HCT 49.5 (H) 36.0 - 46.0 %   MCV 85.3 80.0 - 100.0 fL   MCH 28.4 26.0 - 34.0 pg   MCHC 33.3 30.0 - 36.0 g/dL   RDW 15.1 11.5 - 15.5 %   Platelets 376 150 -  400 K/uL   nRBC 0.0 0.0 - 0.2 %  Comprehensive metabolic panel  Result Value Ref Range   Sodium 137 135 - 145 mmol/L   Potassium 3.1 (L) 3.5 - 5.1  mmol/L   Chloride 97 (L) 98 - 111 mmol/L   CO2 24 22 - 32 mmol/L   Glucose, Bld 91 70 - 99 mg/dL   BUN 6 6 - 20 mg/dL   Creatinine, Ser 0.68 0.44 - 1.00 mg/dL   Calcium 9.4 8.9 - 10.3 mg/dL   Total Protein 7.1 6.5 - 8.1 g/dL   Albumin 3.9 3.5 - 5.0 g/dL   AST 62 (H) 15 - 41 U/L   ALT 67 (H) 0 - 44 U/L   Alkaline Phosphatase 87 38 - 126 U/L   Total Bilirubin 1.6 (H) 0.3 - 1.2 mg/dL   GFR calc non Af Amer >60 >60 mL/min   GFR calc Af Amer >60 >60 mL/min   Anion gap 16 (H) 5 - 15  Magnesium  Result Value Ref Range   Magnesium 1.8 1.7 - 2.4 mg/dL  Vitamin B1  Result Value Ref Range   Vitamin B1 (Thiamine) 53.7 (L) 66.5 - 200.0 nmol/L  Lipid panel  Result Value Ref Range   Cholesterol 108 0 - 200 mg/dL   Triglycerides 84 <150 mg/dL   HDL 21 (L) >40 mg/dL   Total CHOL/HDL Ratio 5.1 RATIO   VLDL 17 0 - 40 mg/dL   LDL Cholesterol 70 0 - 99 mg/dL  TSH  Result Value Ref Range   TSH 3.779 0.350 - 4.500 uIU/mL  CBC  Result Value Ref Range   WBC 10.4 4.0 - 10.5 K/uL   RBC 4.88 3.87 - 5.11 MIL/uL   Hemoglobin 13.9 12.0 - 15.0 g/dL   HCT 41.0 36.0 - 46.0 %   MCV 84.0 80.0 - 100.0 fL   MCH 28.5 26.0 - 34.0 pg   MCHC 33.9 30.0 - 36.0 g/dL   RDW 15.1 11.5 - 15.5 %   Platelets 296 150 - 400 K/uL   nRBC 0.0 0.0 - 0.2 %  Basic metabolic panel  Result Value Ref Range   Sodium 138 135 - 145 mmol/L   Potassium 2.9 (L) 3.5 - 5.1 mmol/L   Chloride 101 98 - 111 mmol/L   CO2 26 22 - 32 mmol/L   Glucose, Bld 88 70 - 99 mg/dL   BUN 8 6 - 20 mg/dL   Creatinine, Ser 0.54 0.44 - 1.00 mg/dL   Calcium 8.6 (L) 8.9 - 10.3 mg/dL   GFR calc non Af Amer >60 >60 mL/min   GFR calc Af Amer >60 >60 mL/min   Anion gap 11 5 - 15  Basic metabolic panel  Result Value Ref Range   Sodium 141 135 - 145 mmol/L   Potassium 3.3 (L) 3.5 - 5.1 mmol/L   Chloride 106 98 - 111 mmol/L   CO2 25 22 - 32 mmol/L   Glucose, Bld 85 70 - 99 mg/dL   BUN 10 6 - 20 mg/dL   Creatinine, Ser 0.59 0.44 - 1.00 mg/dL    Calcium 8.7 (L) 8.9 - 10.3 mg/dL   GFR calc non Af Amer >60 >60 mL/min   GFR calc Af Amer >60 >60 mL/min   Anion gap 10 5 - 15  Basic metabolic panel  Result Value Ref Range   Sodium 142 135 - 145 mmol/L   Potassium 3.4 (L) 3.5 - 5.1 mmol/L   Chloride 109 98 -  111 mmol/L   CO2 28 22 - 32 mmol/L   Glucose, Bld 97 70 - 99 mg/dL   BUN 10 6 - 20 mg/dL   Creatinine, Ser 0.73 0.44 - 1.00 mg/dL   Calcium 8.6 (L) 8.9 - 10.3 mg/dL   GFR calc non Af Amer >60 >60 mL/min   GFR calc Af Amer >60 >60 mL/min   Anion gap 5 5 - 15  Magnesium  Result Value Ref Range   Magnesium 1.8 1.7 - 2.4 mg/dL  ECHOCARDIOGRAM COMPLETE  Result Value Ref Range   Weight 4,123.48 oz   Height 61 in   BP 121/92 mmHg      Assessment & Plan:   Problem List Items Addressed This Visit    Binocular vision disorder with diplopia   History of cerebrovascular accident (CVA) with residual deficit - Primary   Hypokalemia   Relevant Orders   COMPLETE METABOLIC PANEL WITH GFR   Magnesium   Intractable nausea and vomiting   Morbid (severe) obesity due to excess calories (HCC)   S/P laparoscopic sleeve gastrectomy   Relevant Orders   COMPLETE METABOLIC PANEL WITH GFR   Vitamin B1   Vitamin B12   VITAMIN D 25 Hydroxy (Vit-D Deficiency, Fractures)   Magnesium   Vitamin B1 deficiency neuropathy   Relevant Medications   desipramine (NORPRAMIN) 25 MG tablet   thiamine (VITAMIN B-1) 100 MG tablet   Other Relevant Orders   Vitamin B1   Vitamin D deficiency   Relevant Orders   VITAMIN D 25 Hydroxy (Vit-D Deficiency, Fractures)    Other Visit Diagnoses    Vitamin B12 nutritional deficiency       Relevant Orders   Vitamin B12     Clinically with recent acute CVA corpus callosum, with secondary binocular diplopia as sequela, residual deficit, and refractory nausea vomiting due to dizziness - Improved nausea vomiting now s/p Thiamine B1, improved PO intake  Plan - Start Thiamine 100mg  TID for 2 weeks then 1 x daily  maintenance - Check labs tomorrow Vitamin B1, B12, D, CMET - Follow up with Neurology post CVA in 02/2019, Dr Jaynee Eagles [facility] - Continue ASA, Statin - Increase dose Zyprexa short term only for nausea, [facility] - Increase Vitamin D supplement - Follow with Ophtho for double vision as well as Neuro, anticipate will improve in future - Anticipate she will receive HH therapy soon, should have been ordered on DC, if not after she contacts hospital / then we can place order if need  Handicap placard form completed - Temporary up to 6 months, cannot walk >200 ft without stop to rest, using cane / walker, neurological s/p CVA and with double vision diplopia   Meds ordered this encounter  Medications  . thiamine (VITAMIN B-1) 100 MG tablet    Sig: Start 1 tab (100mg ) 3 times a day for 2 weeks, then reduce to 1 tab (100mg ) daily for maintenance    Dispense:  90 tablet    Refill:  1      Follow up plan: Return in about 6 weeks (around 03/10/2019) for 4-6 weeks for [facility] / Neurology follow-up.   Future orders CMET, Vitamin B1, B12, D tomorrow  Nobie Putnam, DO [facility] 01/27/2019, 3:14 PM

## 2019-01-27 NOTE — Patient Instructions (Addendum)
Thank you for coming to the office today.  Will stay tuned for FMLA - out 6/3 - 8/10 - now we can do a 1 month extension for new problem CVA related to old problem, will let you know if questions. Neurologist may end up clearing you.  Increase Olanzapine, Zyprexa as discussed from half pill to whole pill in AM, and half in PM, can adjust further if needed, goal is short term only. Less than 1 month  Rx sent Thiamine B1 Start 1 tab (100mg ) 3 times a day for 2 weeks, then reduce to 1 tab (100mg ) daily for maintenance, Normal  Can increase Vitamin D  Neurologist in hospital, reassurance that vision symptoms double vision will improve.  Continue Aspirin, Statin medicine  If persistent episodes of arm or leg pain numbness weakness loss sensation or vision worsening, seek care immediately, 911 if need.  F/u with Dr Jaynee Eagles in September, for big picture stroke questions, about future prevention and treatment, and resolution of symptoms.  Also please call [facility] / either main hospital or PT - ask about any discharged [facility] PT orders that may be in existence, they should order / setup from HOSPITAL, if you need me to do this I can order and arrange.   DUE for FASTING BLOOD WORK (no food or drink after midnight before the lab appointment, only water or coffee without cream/sugar on the morning of)  SCHEDULE "Lab Only" visit in the morning at the clinic for lab draw in 1 DAY  For Lab Results, once available within 2-3 days of blood draw, you can can log in to MyChart online to view your results and a brief explanation. Also, we can discuss results at next follow-up visit.    Please schedule a Follow-up Appointment to: Return in about 6 weeks (around 03/10/2019) for 4-6 weeks for [facility] / Neurology follow-up.  If you have any other questions or concerns, please feel free to call the office or send a message through [place]. You may also schedule an earlier appointment if  necessary.  Additionally, you may be receiving a survey about your experience at our office within a few days to 1 week by e-mail or mail. We value your feedback.  Nobie Putnam, DO [place]

## 2019-01-28 ENCOUNTER — Encounter: Payer: Self-pay | Admitting: Family Medicine

## 2019-01-28 ENCOUNTER — Other Ambulatory Visit: Payer: Self-pay

## 2019-01-28 DIAGNOSIS — E5111 Dry beriberi: Secondary | ICD-10-CM

## 2019-01-28 DIAGNOSIS — E876 Hypokalemia: Secondary | ICD-10-CM

## 2019-01-28 DIAGNOSIS — F3181 Bipolar II disorder: Secondary | ICD-10-CM | POA: Diagnosis not present

## 2019-01-28 DIAGNOSIS — Z9884 Bariatric surgery status: Secondary | ICD-10-CM

## 2019-01-28 DIAGNOSIS — E559 Vitamin D deficiency, unspecified: Secondary | ICD-10-CM

## 2019-01-28 DIAGNOSIS — E538 Deficiency of other specified B group vitamins: Secondary | ICD-10-CM | POA: Diagnosis not present

## 2019-01-28 NOTE — Unmapped (Deleted)
[employer] GASTROENTEROLOGY CONSULTATION VISIT    REFERRING PROVIDER: Jamal Maes, DO  [address]  [place],  [place] [address]    PRIMARY CARE PROVIDER: Smitty Cords, DO    PATIENT PROFILE: Jacqueline Hall is a 33 y.o. female (DOB: 1986-04-02) who is seen in consultation at the request of Dr. Althea Charon for evaluation of nausea and vomiting      ASSESSMENT AND PLAN:     33 y.o. female with PMHx of morbid obesity s/p robotic gastric sleeve (11/19/2018), HTN, IBS-mixed, bipolar disorder, PTSD and depression who is being seen in follow up for post-op nausea and vomiting.      #Post op nausea and vomiting: he underwent robotic gastric sleeve on 11/19/2018 for morbid obesity, however developed nausea and vomiting about 2 weeks later.  An upper GI series on 12/10/2018 showed mild to moderate narrowing of the gastric sleeve at the level of the mid gastric body concerning for stricture vs post surgical changes.  She underwent an EGD on 12/16/2018 which revealed a normal esophagus, small hiatal hernia, evidence of sleeve gastrectomy and mild angulation in the distal gastric body which was dilated to 20mm, and normal duodenum. Patient's symptoms improved during her hospitalizaiton with initiation of daily PPI, desipramine 12.5mg  qHS and zyprexa 2.5mg  BID.        She also had evidence of fatty infiltration on CT A/P with mildly elevated transaminases concerning for NAFLD.  She was also seen by psychiatry who recommended zyprexa 2.5mg  BID and haldol PRN.  Her nausea and vomiting significantly improved the next day and she was discharged with outpatient follow up.        33 y.o. female with PMHx of morbid obesity s/p robotic gastric sleeve (11/19/2018), HTN, IBS-mixed, bipolar disorder, PTSD and depression who is being seen in consultation for postop nausea and vomiting.  Patient's symptoms started about two weeks after her surgery and has only minimally responded to anti-emetics and low dose centrally acting agents.  EGD 12/16/2018 showed mild angulation of gastric body, now s/p dilation to 20mm.  There was no anatomical cause found to explain her dysphagia and N/V and it would be unlikely for her to develop a new structural cause of her symptoms in the last few weeks.  Differential diagnosis includes postoperative nausea and vomiting, reflux disease, esophageal dysmotility, gastroparesis, and functional GI disorder.  She is still only 6 weeks out from her surgery and within the recovery period, which can take months to improve. She denies heartburn or reflux symptoms, however reflux can certainly worsen after gastric sleeve, and would therefore start a daily PPI.  She denies dysphagia or odynophagia prior to her surgery and it would be unlikely for her to have developed a new esophageal motility disorder, however this can be further worked up as an outpatient with an esophageal manometry if she has persistent symptoms.  Despite her upper GI series noting delayed transit into narrowed portion of gastric sleeve, there was no delay in transit into her proximal small bowel and her gastric body angulation was dilated to 20mm on EGD with no improvement, making gastroparesis less likely (albiet upper GI series only used liquid contrast, would need outpatient GES to further work up).    ??  Patient has an extensive psychiatric history, history of IBS and just had GI surgery, which would make a FGID (functional GI disorder; hypersensitivity) high on the differential.  Treatment for FGID disorders include lifestyle modifications and centrally acting agents.  Patient was  started on low-dose mirtazapine with no significant improvement.  Management of FGID's can be challenging and often require trials of different medications and dosages before achieving adequate symptom control.  She should therefore be followed in GI clinic for chronic management of her symptoms and further discussion of NAFLD.   ??  Patient has evidence of fatty infiltration on CT A/P and mild elevates transaminases with normal tbili and alk phos.  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  Of note, patient reports being told she had a UTI at the OSH.  If so, this can worsen her nausea and vomiting.  ??  ??  - please send UA and urine culture  - check HAV IgG and HBVs Ag/Ab and HBVc Ab  - recommend starting pantoprazole 40mg  daily  - discontinue mirtazapine  - start desipramine 12.5mg  qHS              - can increase to 25mg  qHS in 1 week if tolerating dose  - agree with phenergan PRN  - continue miralax BID  - advance diet as tolerated  - discourage alcohol consumption and emphasized importance of lifestyle changes for weight loss  - outpatient GI follow up with Dr. Loel Dubonnet (we will arrange once closer to discharge)         Patient was seen and discussed with Dr. Marland Kitchen who is in agreement with above assessment and plan.    I spent *** minutes on the {phone audio video visit:[id number]} with the patient. I spent an additional *** minutes on pre- and post-visit activities.     The patient was physically located in [place] or a state in which I am permitted to provide care. The patient and/or parent/guardian understood that s/he may incur co-pays and cost sharing, and agreed to the telemedicine visit. The visit was reasonable and appropriate under the circumstances given the patient's presentation at the time.    The patient and/or parent/guardian has been advised of the potential risks and limitations of this mode of treatment (including, but not limited to, the absence of in-person examination) and has agreed to be treated using telemedicine. The patient's/patient's family's questions regarding telemedicine have been answered.     If the visit was completed in an ambulatory setting, the patient and/or parent/guardian has also been advised to contact their provider???s office for worsening conditions, and seek emergency medical treatment and/or call 911 if the patient deems either necessary.      [name], MD  Gastroenterology and Hepatology Fellow, PGY-6  [employer] [place] [place]        SUBJECTIVE:     CHIEF COMPLAINT: nausea and vomiting    HISTORY OF PRESENT ILLNESS:      Jacqueline Hall is a 33 y.o. female (DOB: 06-22-1985) with PMHx of morbid obesity s/p robotic gastric sleeve (11/19/2018), HTN, IBS-mixed, bipolar disorder, PTSD and depression who is being seen in follow up for post-op nausea and vomiting.  She was initially seen by the GI consult service at [facility] on 01/13/2019.  At that time she reported chronic IBS.  She underwent robotic gastric sleeve on 11/19/2018 for morbid obesity, however developed nausea and vomiting about 2 weeks later.  She would experience severe nausea immediately after eating or drinking with NB, NB emesis about 2 minutes later.  This would occur up to 5-7x a  day, leading to dehydration and difficulty swallowing pills.  Her symptoms did not improve as an outpatient with zofran, phenergan or miralax.  An upper GI series on 12/10/2018 showed mild to moderate narrowing of the gastric sleeve at the level of the mid gastric body concerning for stricture vs post surgical changes.  She underwent an EGD on 12/16/2018 which revealed a normal esophagus, small hiatal hernia, evidence of sleeve gastrectomy and mild angulation in the distal gastric body which was dilated to 20mm, and normal duodenum.  She was transferred from [facility] due to her ongoing pain.  A CT A/P reportedly had no acute intraabdominal abnormalities (images not available).  Given no structural cause found on her EGD, her symptoms were thought to be due to postoperative nausea and vomiting, reflux disease, esophageal dysmotility, gastroparesis, or functional GI disorder.  She was recommended to start daily PPI (target esophagitis or gastritis), desipramine 12.5mg  qHS (increase after 1 week to target FGID), and miralax BID (constipation).  She also had evidence of fatty infiltration on CT A/P with mildly elevated transaminases concerning for NAFLD.  She was also seen by psychiatry who recommended zyprexa 2.5mg  BID and haldol PRN.  Her nausea and vomiting significantly improved the next day and she was discharged with outpatient follow up.    Interval history:        REVIEW OF SYSTEMS:   The balance of 12 systems reviewed is negative except as noted in the HPI.     PAST MEDICAL HISTORY:  Past Medical History:   Diagnosis Date   ??? Abnormal Pap smear of cervix 2013    per patient; biopsy was done and then everything was fine   ??? Anemia 2013   ??? Ankylosing spondylitis (CMS-HCC)    ??? Arthritis 2018   ??? Bipolar 1 disorder (CMS-HCC) 2018   ??? Depression    ??? Disease of thyroid gland    ??? Elevated blood-pressure reading without diagnosis of hypertension    ??? Headache(784.0)     more than 2-3 times a week; takes Ibuprofen. could not afford prescription medication   ??? History of pre-eclampsia in prior pregnancy, currently pregnant 11/2011   ??? 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 Hypertension    ??? Menorrhagia    ??? Morbid obesity with BMI of 50.0-59.9, adult (CMS-HCC)    ??? Obesity    ??? PTSD (post-traumatic stress disorder) 2018   ??? Sleep apnea      Patient Active Problem List    Diagnosis Date Noted   ??? PTSD (post-traumatic stress disorder) 01/13/2019   ??? Herpes zoster without complication 11/11/2018   ??? Elevated hemoglobin A1c 02/12/2018   ??? Dyslipidemia 02/05/2018   ??? Sacroiliitis (CMS-HCC) 12/11/2016   ??? Immunization counseling 12/11/2016   ??? Abnormal skin growth 11/15/2016   ??? Severe bipolar I disorder, most recent episode depressed without psychotic features (CMS-HCC) 10/23/2016   ??? High risk medication use 09/07/2016   ??? Ankylosing spondylitis (CMS-HCC) 09/07/2016   ??? Menorrhagia    ??? CRP elevated 08/29/2016   ??? Thrombocytosis (CMS-HCC) 08/29/2016   ??? Hypertension 05/28/2016   ??? Vitamin D deficiency 05/02/2016   ??? Elevated blood-pressure reading without diagnosis of hypertension    ??? 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 Hypothyroidism 02/13/2016   ??? Anxiety and depression 01/23/2016   ??? Status post tubal ligation 09/22/2015   ??? Encounter for tubal ligation 06/09/2015   ??? Morbid obesity with BMI of 50.0-59.9, adult (  CMS-HCC) 04/25/2015   ??? Dizziness 04/08/2015   ??? Mood disorder (CMS-HCC) 02/02/2015   ??? Iron deficiency anemia 01/04/2015   ??? Migraine without status migrainosus, not intractable 01/04/2015   ??? Pap smear with low grade squamous intraepithelial lesion (LGSIL) 07/04/2011       PAST SURGICAL HISTORY:  Past Surgical History:   Procedure Laterality Date   ??? CHOLECYSTECTOMY  12/20/2007    Initially done L/S then had some sort of complication, required endoscopy   ??? PR LAP, GAST RESTRICT PROC, LONGITUDINAL GASTRECTOMY Midline 11/19/2018    Procedure: R25 ROBOTIC PR LAP, GAST RESTRICT PROC, LONGITUDINAL GASTRECTOMY;  Surgeon: Colon Branch, MD;  Location: [facility];  Service: General Surgery   ??? PR UPPER GI ENDOSCOPY,DIAGNOSIS N/A 07/17/2016    Procedure: UGI ENDO, INCLUDE ESOPHAGUS, STOMACH, & DUODENUM &/OR JEJUNUM; DX W/WO COLLECTION SPECIMN, BY BRUSH OR WASH;  Surgeon: Annie Paras, MD;  [facility];  Service: Gastroenterology   ??? PR UPPER GI ENDOSCOPY,W/DILAT,GASTRIC OUT N/A 12/16/2018    Procedure: UGI ENDO; W/DILATION GASTRIC OUTLET FOR OBSTRUCTION;  Surgeon: Chriss Driver, MD;  [facility];  Service: Gastroenterology   ??? TUBAL LIGATION         MEDICATIONS:  Current Outpatient Medications   Medication Sig Dispense Refill   ??? aspirin (ECOTRIN) 81 MG tablet Take 81 mg by mouth daily.     ??? atorvastatin (LIPITOR) 40 MG tablet Take 40 mg by mouth daily.     ??? cyanocobalamin, vitamin B-12, (NASCOBAL) 500 mcg/spray Spry 1 spray into 1 nare once a week 4 Bottle 11   ??? desipramine (NORPRAMIN) 25 MG tablet Take 0.5 tablets (12.5 mg total) by mouth nightly. (Patient not taking: Reported on 01/19/2019) 15 tablet 1   ??? elagolix (ORILISSA) 150 mg Tab Take 150 mg by mouth daily.     ??? esomeprazole (NEXIUM) 40 MG capsule Take 1 capsule (40 mg total) by mouth daily. Please open capsule and mix in drink. 30 capsule 5   ??? haloperidoL (HALDOL) 2 MG tablet Take 1 tablet (2 mg total) by mouth two (2) times a day as needed (for nausea/anxiety if zyprexa is ineffective). (Patient not taking: Reported on 01/19/2019) 60 tablet 0   ??? HUMIRA PEN CITRATE FREE 40 MG/0.4 ML INJECT 40 MG (0.4 ML) UNDER THE SKIN EVERY 14 DAYS (Patient not taking: Reported on 01/19/2019) 6 each 3   ??? levothyroxine (SYNTHROID, LEVOTHROID) 25 MCG tablet Take 1.5 tablets (37.5 mcg total) by mouth daily. (Patient taking differently: Take 25 mcg by mouth daily. ) 90 tablet 3   ??? MAGOX 400 mg (241.3 mg magnesium) tablet      ??? MEDICAL SUPPLY ITEM Please provide patient with 2 left carpal tunnel syndrome braces for wrists. (Patient not taking: Reported on 01/19/2019) 2 Units 0   ??? OLANZapine zydis (ZYPREXA ZYDIS) 5 MG disintegrating tablet Take 0.5 tablets (2.5 mg total) by mouth two (2) times a day as needed (for nausea/anxiety). 15 tablet 1   ??? propranoloL (INDERAL) 60 MG tablet Take 60 mg by mouth Three (3) times a day.     ??? spironolactone (ALDACTONE) 50 MG tablet Take 50 mg by mouth Two (2) times a day.       No current facility-administered medications for this visit.        ALLERGIES:  Patient has no known allergies.    FAMILY HISTORY:  family history includes Atrial fibrillation in her paternal grandmother;  Dementia in her maternal grandmother; Diabetes in her maternal grandmother; Heart attack in her maternal grandmother; Heart disease in her maternal grandfather and maternal grandmother; Heart failure in her paternal grandmother; Hypertension in her brother, father, maternal grandmother, and sister; Hypothyroidism in her maternal aunt; Migraines in her maternal aunt; Multiple sclerosis in her maternal uncle; No Known Problems in her mother; Osteoporosis in her maternal grandmother; Stroke in her maternal grandmother.    SOCIAL HISTORY:  Social History     Tobacco Use   ??? Smoking status: Never Smoker   ??? Smokeless tobacco: Never Used   Substance Use Topics   ??? Alcohol use: Never   ??? Drug use: Never       OBJECTIVE:   VITAL SIGNS: There were no vitals taken for this visit.  Wt Readings from Last 6 Encounters:   01/19/19 (!) 116.6 kg (257 lb)   01/12/19 (!) 117 kg (258 lb)   12/23/18 (!) 121.6 kg (268 lb 1.6 oz)   12/16/18 (!) 125.6 kg (277 lb)   12/09/18 (!) 125.6 kg (277 lb)   11/19/18 (!) 136.3 kg (300 lb 7 oz)       PHYSICAL EXAM:  Constitutional: Alert, Oriented x 3, No acute distress, well nourished and well hydrated.   HEENT: PERRL, conjunctiva clear, anicteric, oropharynx clear, neck supple.   CV: Regular rate and rhythm, normal S1, S2.  Lung: Clear to auscultation bilaterally. Unlabored breathing.   Abdomen: Soft, normal bowel sounds, non-distended, non-tender.   Extremities: Warm, no edema.  MSK: No joint swelling or tenderness noted.   Skin: No rashes, jaundice or skin lesions.  Neuro: No focal deficits. Normal gait.  Mental Status: Thought organized, appropriate affect, pleasantly interactive, not anxious appearing.     DIAGNOSTIC STUDIES:  I have reviewed all pertinent diagnostic studies, including:    GI Procedures:  ***    Radiographic studies:  ***    Laboratory results:  Lab Results   Component Value Date    WBC 9.3 01/12/2019    HGB 15.3 01/12/2019    HCT 47.2 (H) 01/12/2019    PLT 274 01/12/2019     Lab Results   Component Value Date    NA 138 01/12/2019    K 4.2 01/12/2019    CL 102 01/12/2019    CO2 23.0 01/12/2019    BUN 4 (L) 01/12/2019    CREATININE 0.51 (L) 01/12/2019    CALCIUM 9.3 01/12/2019    MG 1.9 11/20/2018    PHOS 3.3 11/20/2018     Lab Results   Component Value Date    ALKPHOS 75 01/12/2019    BILITOT 1.0 01/12/2019    PROT 5.9 (L) 01/12/2019    ALBUMIN 3.5 01/12/2019    ALT 41 (H) 01/12/2019    AST 47 (H) 01/12/2019     Lab Results   Component Value Date    PT 14.4 (H) 11/22/2018    INR 1.25 11/22/2018    APTT 32.9 11/22/2018

## 2019-01-31 LAB — COMPLETE METABOLIC PANEL WITH GFR
AG Ratio: 1.9 (calc) (ref 1.0–2.5)
ALT: 63 U/L — ABNORMAL HIGH (ref 6–29)
AST: 37 U/L — ABNORMAL HIGH (ref 10–30)
Albumin: 3.7 g/dL (ref 3.6–5.1)
Alkaline phosphatase (APISO): 56 U/L (ref 31–125)
BUN: 13 mg/dL (ref 7–25)
CO2: 25 mmol/L (ref 20–32)
Calcium: 9.3 mg/dL (ref 8.6–10.2)
Chloride: 109 mmol/L (ref 98–110)
Creat: 0.88 mg/dL (ref 0.50–1.10)
GFR, Est African American: 102 mL/min/{1.73_m2} (ref >=60)
GFR, Est Non African American: 88 mL/min/{1.73_m2} (ref >=60)
Globulin: 1.9 g/dL (calc) (ref 1.9–3.7)
Glucose, Bld: 98 mg/dL (ref 65–99)
Potassium: 4.2 mmol/L (ref 3.5–5.3)
Sodium: 143 mmol/L (ref 135–146)
Total Bilirubin: 0.6 mg/dL (ref 0.2–1.2)
Total Protein: 5.6 g/dL — ABNORMAL LOW (ref 6.1–8.1)

## 2019-01-31 LAB — MAGNESIUM: Magnesium: 2.3 mg/dL (ref 1.5–2.5)

## 2019-01-31 LAB — VITAMIN B1: Vitamin B1 (Thiamine): 16 nmol/L (ref 8–30)

## 2019-01-31 LAB — VITAMIN D 25 HYDROXY (VIT D DEFICIENCY, FRACTURES): Vit D, 25-Hydroxy: 24 ng/mL — ABNORMAL LOW (ref 30–100)

## 2019-01-31 LAB — VITAMIN B12: Vitamin B-12: 1134 pg/mL — ABNORMAL HIGH (ref 200–1100)

## 2019-02-03 ENCOUNTER — Encounter: Payer: Self-pay | Admitting: Family Medicine

## 2019-02-03 NOTE — Progress Notes (Signed)
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 MRI Brain (Date 01/15/19) 2. Referral to Neurology Dr Jaynee Eagles 3. Referral to Physical therapy from hospital  Condition specific Medications/Dosage/Frequency -  - Aspirin 325mg  daily - Atorvastatin 40mg  daily - Olanzapine 5mg  (take 2.5mg  BID PRN nausea) - Thiamine B1 100mg  TID x 2 week then 100mg  daily  Treatments: Date of Disability: 01/15/19 (previously already on short term disability 6/3-8/10 due to gastric sleeve surgery (that disability was not completed by me)  Expected Return to Work Date: with restrictions 4-6 weeks, without restriction 1-3 months (approx) Date you first treated this patient: 01/27/19 for this condition Next visit 02/19/19 - Dr Baxter Flattery Neuro  Was patient hospitalized for this condition? [x]  Yes  Restrictions / Limitations: - Based on office visit dated: 01/27/19  Completed, signed, and dated Short Term Disability paperwork on 02/03/19. To be scanned into chart and submitted via fax  Attached office visit 01/27/19, H&P DC 01/15/19  Nobie Putnam, [facility] 02/03/2019, 6:25 PM

## 2019-02-09 ENCOUNTER — Emergency Department
Admission: EM | Admit: 2019-02-09 | Discharge: 2019-02-09 | Disposition: A | Discharge status: Home / Self Care | Payer: BC Managed Care – PPO | Attending: Emergency Medicine | Admitting: Emergency Medicine

## 2019-02-09 ENCOUNTER — Emergency Department: Payer: BC Managed Care – PPO

## 2019-02-09 ENCOUNTER — Other Ambulatory Visit: Payer: Self-pay

## 2019-02-09 DIAGNOSIS — Z7982 Long term (current) use of aspirin: Secondary | ICD-10-CM | POA: Insufficient documentation

## 2019-02-09 DIAGNOSIS — N202 Calculus of kidney with calculus of ureter: Secondary | ICD-10-CM | POA: Diagnosis not present

## 2019-02-09 DIAGNOSIS — Z79899 Other long term (current) drug therapy: Secondary | ICD-10-CM | POA: Diagnosis not present

## 2019-02-09 DIAGNOSIS — N200 Calculus of kidney: Secondary | ICD-10-CM

## 2019-02-09 DIAGNOSIS — I10 Essential (primary) hypertension: Secondary | ICD-10-CM | POA: Diagnosis not present

## 2019-02-09 DIAGNOSIS — E039 Hypothyroidism, unspecified: Secondary | ICD-10-CM | POA: Insufficient documentation

## 2019-02-09 DIAGNOSIS — N132 Hydronephrosis with renal and ureteral calculous obstruction: Secondary | ICD-10-CM | POA: Diagnosis not present

## 2019-02-09 DIAGNOSIS — R109 Unspecified abdominal pain: Secondary | ICD-10-CM | POA: Diagnosis not present

## 2019-02-09 LAB — URINALYSIS, COMPLETE (UACMP) WITH MICROSCOPIC
Bilirubin Urine: NEGATIVE
Glucose, UA: NEGATIVE mg/dL
Ketones, ur: 5 mg/dL — AB
Nitrite: NEGATIVE
Protein, ur: 100 mg/dL — AB
RBC / HPF: >50 RBC/hpf — ABNORMAL HIGH (ref 0–5)
Specific Gravity, Urine: 1.019 (ref 1.005–1.030)
pH: 5 (ref 5.0–8.0)

## 2019-02-09 LAB — COMPREHENSIVE METABOLIC PANEL
ALT: 72 U/L — ABNORMAL HIGH (ref 0–44)
AST: 52 U/L — ABNORMAL HIGH (ref 15–41)
Albumin: 4.2 g/dL (ref 3.5–5.0)
Alkaline Phosphatase: 72 U/L (ref 38–126)
Anion gap: 12 (ref 5–15)
BUN: 9 mg/dL (ref 6–20)
CO2: 23 mmol/L (ref 22–32)
Calcium: 9.3 mg/dL (ref 8.9–10.3)
Chloride: 105 mmol/L (ref 98–111)
Creatinine, Ser: 0.78 mg/dL (ref 0.44–1.00)
GFR calc Af Amer: >60 mL/min (ref >60)
GFR calc non Af Amer: >60 mL/min (ref >60)
Glucose, Bld: 142 mg/dL — ABNORMAL HIGH (ref 70–99)
Potassium: 3.7 mmol/L (ref 3.5–5.1)
Sodium: 140 mmol/L (ref 135–145)
Total Bilirubin: 0.9 mg/dL (ref 0.3–1.2)
Total Protein: 7 g/dL (ref 6.5–8.1)

## 2019-02-09 LAB — CBC
HCT: 44.6 % (ref 36.0–46.0)
Hemoglobin: 14.1 g/dL (ref 12.0–15.0)
MCH: 28.9 pg (ref 26.0–34.0)
MCHC: 31.6 g/dL (ref 30.0–36.0)
MCV: 91.4 fL (ref 80.0–100.0)
Platelets: 315 10*3/uL (ref 150–400)
RBC: 4.88 MIL/uL (ref 3.87–5.11)
RDW: 16.3 % — ABNORMAL HIGH (ref 11.5–15.5)
WBC: 10 10*3/uL (ref 4.0–10.5)
nRBC: 0 % (ref 0.0–0.2)

## 2019-02-09 LAB — LIPASE, BLOOD: Lipase: 35 U/L (ref 11–51)

## 2019-02-09 LAB — POCT PREGNANCY, URINE: Preg Test, Ur: NEGATIVE

## 2019-02-09 MED ORDER — SODIUM CHLORIDE 0.9% FLUSH
3.0000 mL | Freq: Once | INTRAVENOUS | Status: AC
  Administered 2019-02-09: 14:00:00 3 mL via INTRAVENOUS

## 2019-02-09 MED ORDER — ONDANSETRON HCL 4 MG/2ML IJ SOLN
4.0000 mg | Freq: Once | INTRAMUSCULAR | Status: AC
  Administered 2019-02-09: 12:00:00 4 mg via INTRAVENOUS
  Filled 2019-02-09: qty 2

## 2019-02-09 MED ORDER — CEPHALEXIN 500 MG PO CAPS: 500.0000 mg | ORAL_CAPSULE | Freq: Two times a day (BID) | ORAL | 0 refills | Status: DC

## 2019-02-09 MED ORDER — TAMSULOSIN HCL 0.4 MG PO CAPS: 0.4000 mg | ORAL_CAPSULE | Freq: Every day | ORAL | 0 refills | Status: DC

## 2019-02-09 MED ORDER — SODIUM CHLORIDE 0.9 % IV BOLUS
1000.0000 mL | Freq: Once | INTRAVENOUS | Status: AC
  Administered 2019-02-09: 12:00:00 1000 mL via INTRAVENOUS

## 2019-02-09 MED ORDER — KETOROLAC TROMETHAMINE 30 MG/ML IJ SOLN
30.0000 mg | Freq: Once | INTRAMUSCULAR | Status: AC
  Administered 2019-02-09: 14:00:00 30 mg via INTRAVENOUS
  Filled 2019-02-09: qty 1

## 2019-02-09 MED ORDER — OXYCODONE-ACETAMINOPHEN 5-325 MG PO TABS: 1.0000 | ORAL_TABLET | ORAL | 0 refills | Status: DC | PRN

## 2019-02-09 MED ORDER — MORPHINE SULFATE (PF) 4 MG/ML IV SOLN
4.0000 mg | Freq: Once | INTRAVENOUS | Status: AC
  Administered 2019-02-09: 12:00:00 4 mg via INTRAVENOUS
  Filled 2019-02-09: qty 1

## 2019-02-09 NOTE — ED Triage Notes (Signed)
Pt c/o left lower back pain that started last night and today having pain radiating around to the LLQ with N/V.Leah Osborne states she had a BM today. Denies diarrhea. States she does have a hx of kidney stones but this does not feel the same.

## 2019-02-09 NOTE — ED Provider Notes (Signed)
[facility] Emergency Department Provider Note  Time seen: 11:50 AM  I have reviewed the triage vital signs and the nursing notes.   HISTORY  Chief Complaint Abdominal Pain   HPI Leah Osborne is a 33 y.o. female with a past medical history of arthritis, bipolar, hypertension, hyperlipidemia, presents to the emergency department for left flank pain.  According to the patient yesterday she developed pain in her left back which is now progressed down into her left lower quadrant.  Patient denies any dysuria or hematuria does state a history of 1 kidney stone previously.  Denies any diarrhea, has had 2 episodes of vomiting due to the pain per patient.  No history of colitis or diverticulitis.   Past Medical History:  Diagnosis Date  . Abnormal Pap smear of cervix   . Arthritis   . Arthritis, rheumatoid ([place])   . Atypical chest pain   . Bipolar 1 disorder ([place])   . Headache    MIGRAINES  . Hyperlipidemia   . Hypertension   . Morbid obesity ([place])    a. 11/2018 s/p Robotic Sleeve Gastrectomy @ [facility].  Marland Kitchen Palpitations    a. 09/2017 Zio Monitor: predominantly RSR, avg HR 87, 11 A tach/SVT episodes, fastest 176 x 7 beats, longest 9 beats. Rare PAC's, occas PVC's, rare couplets/triplets.  . Persistent Nausea and vomiting    a. Following sleeve gastrectomy 11/2018  . PTSD (post-traumatic stress disorder)   . PVC (premature ventricular contraction)   . Social phobia     Patient Active Problem List   Diagnosis Date Noted  . History of cerebrovascular accident (CVA) with residual deficit 01/27/2019  . 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 Intractable nausea and vomiting 01/10/2019  . 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 Hypokalemia 01/01/2019  . Elevated hemoglobin A1c 02/12/2018  . Dyslipidemia 02/05/2018  . Abnormal skin growth 11/15/2016  . PTSD (post-traumatic stress disorder)  10/24/2016  . Severe bipolar I disorder, most recent episode depressed without psychotic features ([place]) 10/23/2016  . Ankylosing spondylitis ([place]) 10/23/2016  . Sacroiliac inflammation ([place]) 08/02/2016  . Hypertension 05/28/2016  . Vitamin D deficiency 05/02/2016  . 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 Hypothyroidism 02/13/2016  . Anxiety and depression 01/23/2016  . Status post tubal ligation 09/22/2015  . Morbid (severe) obesity due to excess calories ([place]) 04/25/2015  . Anemia, iron deficiency 01/04/2015  . Headache, migraine 01/04/2015  . Abnormal cytological findings in female genital organs 07/04/2011    Past Surgical History:  Procedure Laterality Date  . CHOLECYSTECTOMY    . LAPAROSCOPIC GASTRIC SLEEVE RESECTION  11/19/2018  . LAPAROSCOPIC TUBAL LIGATION Bilateral 09/19/2015   Procedure: LAPAROSCOPIC BILATERAL TUBAL BANDING;  Surgeon: Brayton Mars, MD;  Location: [facility] ORS;  Service: Gynecology;  Laterality: Bilateral;    Prior to Admission medications   Medication Sig Start Date End Date Taking? Authorizing Provider  aspirin EC 325 MG EC tablet Take 1 tablet (325 mg total) by mouth daily. 01/19/19   Stark Jock Jude, MD  atorvastatin (LIPITOR) 40 MG tablet Take 1 tablet (40 mg total) by mouth daily at 6 PM. 01/18/19   Stark Jock, Jude, MD  desipramine (NORPRAMIN) 25 MG tablet TAKE 0.5 TABLETS (12.5 MG TOTAL) BY MOUTH NIGHTLY. 01/14/19   [provider]  esomeprazole (NEXIUM) 40 MG capsule Take 40 mg by mouth daily.  12/25/18 06/23/19  [provider]  levothyroxine (SYNTHROID, [place]) 25  MCG tablet Take 1 tablet (25 mcg total) by mouth daily before breakfast. 05/08/18   Karamalegos, Devonne Doughty, DO  OLANZapine zydis (ZYPREXA) 5 MG disintegrating tablet Take 0.5 tablets (2.5 mg total) by mouth 2 (two) times a day. 01/18/19   Stark Jock Jude, MD  propranolol ER (INDERAL LA) 60 MG 24 hr capsule Take 1 capsule (60 mg total) by mouth daily. 07/10/18    Deboraha Sprang, MD  thiamine (VITAMIN B-1) 100 MG tablet Start 1 tab (100mg ) 3 times a day for 2 weeks, then reduce to 1 tab (100mg ) daily for maintenance 01/27/19   Karamalegos, Devonne Doughty, DO  vitamin B-12 (CYANOCOBALAMIN) 500 MCG tablet Take 1 tablet (500 mcg total) by mouth daily. 01/19/19   Stark Jock Jude, MD    No Known Allergies  Family History  Problem Relation Age of Onset  . Diabetes Maternal Grandmother   . Heart disease Maternal Grandmother   . Heart failure Maternal Grandmother   . Heart attack Maternal Grandmother   . Rheum arthritis Mother   . Healthy Father   . Heart Problems Maternal Grandfather   . Atrial fibrillation Paternal Grandmother   . Cancer Neg Hx     Social History Social History   Tobacco Use  . Smoking status: Never Smoker  . Smokeless tobacco: Never Used  Substance Use Topics  . Alcohol use: Not Currently    Alcohol/week: 0.0 standard drinks  . Drug use: No    Review of Systems Constitutional: Negative for fever. Cardiovascular: Negative for chest pain. Respiratory: Negative for shortness of breath. Gastrointestinal: Left flank pain.  2 episodes of vomiting.  No diarrhea. Genitourinary: Negative for urinary compaints Musculoskeletal: Negative for musculoskeletal complaints Skin: Negative for skin complaints  Neurological: Negative for headache All other ROS negative  ____________________________________________   PHYSICAL EXAM:  VITAL SIGNS: ED Triage Vitals  Enc Vitals Group     BP 02/09/19 1132 (!) 132/101     Pulse Rate 02/09/19 1132 62     Resp 02/09/19 1132 18     Temp 02/09/19 1132 97.8 F (36.6 C)     Temp Source 02/09/19 1132 Oral     SpO2 02/09/19 1132 99 %     Weight 02/09/19 1133 258 lb (117 kg)     Height 02/09/19 1133 5\' 1"  (1.549 m)     Head Circumference --      Peak Flow --      Pain Score 02/09/19 1132 7     Pain Loc --      Pain Edu? --      Excl. in [place]? --    Constitutional: Alert and oriented. Well  appearing and in no distress. Eyes: Normal exam ENT      Head: Normocephalic and atraumatic.      Mouth/Throat: Mucous membranes are moist. Cardiovascular: Normal rate, regular rhythm.  Respiratory: Normal respiratory effort without tachypnea nor retractions. Breath sounds are clear  Gastrointestinal: Soft and nontender. No distention.   Musculoskeletal: Nontender with normal range of motion in all extremities.  Neurologic:  Normal speech and language. No gross focal neurologic deficits Skin:  Skin is warm, dry and intact.  Psychiatric: Mood and affect are normal.  ____________________________________________   RADIOLOGY  CT consistent with 4 mm left mid to distal ureteral stone.  ____________________________________________   INITIAL IMPRESSION / ASSESSMENT AND PLAN / ED COURSE  Pertinent labs & imaging results that were available during my care of the patient were reviewed by me and considered in  my medical decision making (see chart for details).   Patient presents to the emergency department for left flank pain.  Differential would include ureterolithiasis, urinary tract infection, pyelonephritis, ovarian cyst, colitis or diverticulitis.  We will check labs, urinalysis, treat pain nausea IV hydrate and obtain CT imaging.  Patient agreeable to plan of care.  CT shows 4 mm left ureteral stone.  We will place the patient on Flomax, Percocet and ibuprofen.  I discussed return precautions for any fever worsening pain.  Otherwise we will discharge with a urine strainer and the patient will follow-up with urology.  Equivocal urinalysis, we will send a urine culture and cover with Keflex as a precaution.   [place] was evaluated in Emergency Department on 02/09/2019 for the symptoms described in the history of present illness. She was evaluated in the context of the global COVID-19 pandemic, which necessitated consideration that the patient might be at risk for infection with  the SARS-CoV-2 virus that causes COVID-19. Institutional protocols and algorithms that pertain to the evaluation of patients at risk for COVID-19 are in a state of rapid change based on information released by regulatory bodies including the CDC and federal and state organizations. These policies and algorithms were followed during the patient's care in the ED.  ____________________________________________   FINAL CLINICAL IMPRESSION(S) / ED DIAGNOSES  Left flank pain Ureterolithiasis   Harvest Dark, MD 02/09/19 [phone]

## 2019-02-16 ENCOUNTER — Other Ambulatory Visit: Payer: Self-pay

## 2019-02-16 DIAGNOSIS — I6930 Unspecified sequelae of cerebral infarction: Secondary | ICD-10-CM

## 2019-02-16 DIAGNOSIS — H4923 Sixth [abducent] nerve palsy, bilateral: Secondary | ICD-10-CM | POA: Diagnosis not present

## 2019-02-16 DIAGNOSIS — E785 Hyperlipidemia, unspecified: Secondary | ICD-10-CM

## 2019-02-16 MED ORDER — ATORVASTATIN CALCIUM 40 MG PO TABS: 40.0000 mg | ORAL_TABLET | Freq: Every day | ORAL | 0 refills | Status: DC

## 2019-02-16 MED ORDER — ASPIRIN 325 MG PO TBEC: 325.0000 mg | DELAYED_RELEASE_TABLET | Freq: Every day | ORAL | 0 refills | Status: DC

## 2019-02-19 ENCOUNTER — Ambulatory Visit (INDEPENDENT_AMBULATORY_CARE_PROVIDER_SITE_OTHER): Payer: BC Managed Care – PPO | Admitting: Neurology

## 2019-02-19 ENCOUNTER — Encounter: Payer: Self-pay | Admitting: Neurology

## 2019-02-19 ENCOUNTER — Other Ambulatory Visit: Payer: Self-pay

## 2019-02-19 VITALS — BP 128/84 | HR 79 | Temp 98.4°F | Ht 61.0 in | Wt 266.0 lb

## 2019-02-19 DIAGNOSIS — H5711 Ocular pain, right eye: Secondary | ICD-10-CM

## 2019-02-19 DIAGNOSIS — H532 Diplopia: Secondary | ICD-10-CM

## 2019-02-19 DIAGNOSIS — I639 Cerebral infarction, unspecified: Secondary | ICD-10-CM | POA: Diagnosis not present

## 2019-02-19 DIAGNOSIS — D6859 Other primary thrombophilia: Secondary | ICD-10-CM | POA: Diagnosis not present

## 2019-02-19 DIAGNOSIS — R29898 Other symptoms and signs involving the musculoskeletal system: Secondary | ICD-10-CM

## 2019-02-19 DIAGNOSIS — E519 Thiamine deficiency, unspecified: Secondary | ICD-10-CM

## 2019-02-19 DIAGNOSIS — H499 Unspecified paralytic strabismus: Secondary | ICD-10-CM

## 2019-02-19 NOTE — Patient Instructions (Addendum)
Discuss vision therapy with ophthalmology PT/OT at [facility] Blood work for hypercoagulable disorders Trans Cranial Doppler - will call Continue ASA 81-325mg  daily   There is increased risk for stroke in women with migraine with aura and a contraindication for the combined contraceptive pill for use by women who have migraine with aura. The risk for women with migraine without aura is lower. However other risk factors like smoking are far more likely to increase stroke risk than migraine. There is a recommendation for no smoking and for the use of OCPs without estrogen such as progestogen only pills particularly for women with migraine with aura.Marland Kitchen People who have migraine headaches with auras may be 3 times more likely to have a stroke caused by a blood clot, compared to migraine patients who don't see auras. Women who take hormone-replacement therapy may be 30 percent more likely to suffer a clot-based stroke than women not taking medication containing estrogen. Other risk factors like smoking and high blood pressure may be  much more important Stroke Prevention Some medical conditions and behaviors are associated with a higher chance of having a stroke. You can help prevent a stroke by making nutrition, lifestyle, and other changes, including managing any medical conditions you may have. What nutrition changes can be made?   Eat healthy foods. You can do this by: ? Choosing foods high in fiber, such as fresh fruits and vegetables and whole grains. ? Eating at least 5 or more servings of fruits and vegetables a day. Try to fill half of your plate at each meal with fruits and vegetables. ? Choosing lean protein foods, such as lean cuts of meat, poultry without skin, fish, tofu, beans, and nuts. ? Eating low-fat dairy products. ? Avoiding foods that are high in salt (sodium). This can help lower blood pressure. ? Avoiding foods that have saturated fat, trans fat, and cholesterol. This can help  prevent high cholesterol. ? Avoiding processed and premade foods.  Follow your health care provider's specific guidelines for losing weight, controlling high blood pressure (hypertension), lowering high cholesterol, and managing diabetes. These may include: ? Reducing your daily calorie intake. ? Limiting your daily sodium intake to 1,500 milligrams (mg). ? Using only healthy fats for cooking, such as olive oil, canola oil, or sunflower oil. ? Counting your daily carbohydrate intake. What lifestyle changes can be made?  Maintain a healthy weight. Talk to your health care provider about your ideal weight.  Get at least 30 minutes of moderate physical activity at least 5 days a week. Moderate activity includes brisk walking, biking, and swimming.  Do not use any products that contain nicotine or tobacco, such as cigarettes and e-cigarettes. If you need help quitting, ask your health care provider. It may also be helpful to avoid exposure to secondhand smoke.  Limit alcohol intake to no more than 1 drink a day for nonpregnant women and 2 drinks a day for men. One drink equals 12 oz of beer, 5 oz of wine, or 1 oz of hard liquor.  Stop any illegal drug use.  Avoid taking birth control pills. Talk to your health care provider about the risks of taking birth control pills if: ? You are over 50 years old. ? You smoke. ? You get migraines. ? You have ever had a blood clot. What other changes can be made?  Manage your cholesterol levels. ? Eating a healthy diet is important for preventing high cholesterol. If cholesterol cannot be managed through diet alone, you may  also need to take medicines. ? Take any prescribed medicines to control your cholesterol as told by your health care provider.  Manage your diabetes. ? Eating a healthy diet and exercising regularly are important parts of managing your blood sugar. If your blood sugar cannot be managed through diet and exercise, you may need to  take medicines. ? Take any prescribed medicines to control your diabetes as told by your health care provider.  Control your hypertension. ? To reduce your risk of stroke, try to keep your blood pressure below 130/80. ? Eating a healthy diet and exercising regularly are an important part of controlling your blood pressure. If your blood pressure cannot be managed through diet and exercise, you may need to take medicines. ? Take any prescribed medicines to control hypertension as told by your health care provider. ? Ask your health care provider if you should monitor your blood pressure at home. ? Have your blood pressure checked every year, even if your blood pressure is normal. Blood pressure increases with age and some medical conditions.  Get evaluated for sleep disorders (sleep apnea). Talk to your health care provider about getting a sleep evaluation if you snore a lot or have excessive sleepiness.  Take over-the-counter and prescription medicines only as told by your health care provider. Aspirin or blood thinners (antiplatelets or anticoagulants) may be recommended to reduce your risk of forming blood clots that can lead to stroke.  Make sure that any other medical conditions you have, such as atrial fibrillation or atherosclerosis, are managed. What are the warning signs of a stroke? The warning signs of a stroke can be easily remembered as BEFAST.  B is for balance. Signs include: ? Dizziness. ? Loss of balance or coordination. ? Sudden trouble walking.  E is for eyes. Signs include: ? A sudden change in vision. ? Trouble seeing.  F is for face. Signs include: ? Sudden weakness or numbness of the face. ? The face or eyelid drooping to one side.  A is for arms. Signs include: ? Sudden weakness or numbness of the arm, usually on one side of the body.  S is for speech. Signs include: ? Trouble speaking (aphasia). ? Trouble understanding.  T is for time. ? These symptoms  may represent a serious problem that is an emergency. Do not wait to see if the symptoms will go away. Get medical help right away. Call your local emergency services (911 in the [place] Do not drive yourself to the hospital.  Other signs of stroke may include: ? A sudden, severe headache with no known cause. ? Nausea or vomiting. ? Seizure. Where to find more information For more information, visit:  American Stroke Association: www.strokeassociation.org  National Stroke Association: www.stroke.org Summary  You can prevent a stroke by eating healthy, exercising, not smoking, limiting alcohol intake, and managing any medical conditions you may have.  Do not use any products that contain nicotine or tobacco, such as cigarettes and e-cigarettes. If you need help quitting, ask your health care provider. It may also be helpful to avoid exposure to secondhand smoke.  Remember BEFAST for warning signs of stroke. Get help right away if you or a loved one has any of these signs. This information is not intended to replace advice given to you by your health care provider. Make sure you discuss any questions you have with your health care provider. Document Released: 07/12/2004 Document Revised: 05/17/2017 Document Reviewed: 07/10/2016 Elsevier Patient Education  2020 [employer].

## 2019-02-19 NOTE — Progress Notes (Signed)
[facility]    Provider:  Dr Jaynee Eagles Requesting Provider: Nobie Putnam * Primary Care Provider:  Olin Hauser, DO  CC:  stroke  HPI:  Leah Osborne is a 33 y.o. female here as requested by Nobie Putnam * for Stroke. PMHx PTSD, obesity, HT, HLD, migraines, bipolar 1, migraine w/aura  She has chronic nausea and vomiting since June since bariatric surgery, she has lost 34 pounds, been dehydrated. She was discharged from [facility] for this, she went to [facility] and they were going to send her home and to follow up with surgery. She had double vision and unsteadiness. She went to the eye doctor, he thought it was Wernicke due to deficiency and MRI showed stroke. Maternal grandfather had a stroke when 18, maternal grandfather also had a stroke at the age of 48. She has no history of DVTs or miscarriages. Not on aspirin. She had a sleeve not a roux-en-y. Her balance is impaired and she can fall. 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 She has not had Physical Therapy. She lives in [place]. Double vision is better but not completely better. She is following with Ophthalmology for eye therapy. She has weakness int he right leg. Doble vision Started 23rd of July.  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 Otherwise negative.  Reviewed echocardiogram report:   IMPRESSIONS    1. The left ventricle has normal systolic function with an ejection fraction of 60-65%. The cavity size was normal. Left ventricular diastolic parameters were normal.  2. The right ventricle has normal systolic function. The cavity was normal. There is no increase in right ventricular wall thickness. Right ventricular  systolic pressure is mildly elevated with an estimated pressure of 33.6 mmHg.  3. Rare PVCs  FINDINGS  Left Ventricle: The left ventricle has normal systolic function, with an ejection fraction of 60-65%. The cavity size was normal. There is no increase in left ventricular wall thickness. Left ventricular diastolic parameters were normal. Definity  contrast agent was given IV to delineate the left ventricular endocardial borders.  Right Ventricle: The right ventricle has normal systolic function. The cavity was normal. There is no increase in right ventricular wall thickness. Right ventricular systolic pressure is mildly elevated with an estimated pressure of 33.6 mmHg.  Left Atrium: Left atrial size was normal in size.  Right Atrium: Right atrial size was normal in size. Right atrial pressure is estimated at 10 mmHg.  Interatrial Septum: No atrial level shunt detected by color flow Doppler.  Pericardium: There is no evidence of pericardial effusion.  Mitral Valve: The mitral valve is normal in structure. Mitral valve regurgitation is not visualized by color flow Doppler.  Tricuspid Valve: The tricuspid valve is normal in structure. Tricuspid valve regurgitation was not visualized by color flow Doppler.  Aortic Valve: The aortic valve is grossly normal Aortic valve regurgitation was not visualized by color flow Doppler.  Pulmonic Valve: The pulmonic valve was grossly normal. Pulmonic valve regurgitation is not visualized by color flow Doppler.  Aorta: The aorta is normal in size and structure.  Venous: The inferior vena cava is normal in size with greater than 50% respiratory variability.  Carotid Dopplers: IMPRESSION: No significant carotid atherosclerosis. No hemodynamically significant ICA stenosis. Degree  of narrowing less than 50% bilaterally by ultrasound criteria.  Review of Systems: Patient complains of symptoms per HPI as well as the following symptoms:  diplopia, stroke. Pertinent negatives and positives per HPI. All others negative.   Social History   Socioeconomic History   Marital status: Married    Spouse name: Not on file   Number of children: Not on file   Years of education: Not on file   Highest education level: Not on file  Occupational History   Not on file  Social Needs   Financial resource strain: Not on file   Food insecurity    Worry: Not on file    Inability: Not on file   Transportation needs    Medical: Not on file    Non-medical: Not on file  Tobacco Use   Smoking status: Never Smoker   Smokeless tobacco: Never Used  Substance and Sexual Activity   Alcohol use: Not Currently    Alcohol/week: 0.0 standard drinks   Drug use: No   Sexual activity: Yes    Birth control/protection: Surgical    Comment: tubal  Lifestyle   Physical activity    Days per week: Not on file    Minutes per session: Not on file   Stress: Not on file  Relationships   Social connections    Talks on phone: Not on file    Gets together: Not on file    Attends religious service: Not on file    Active member of club or organization: Not on file    Attends meetings of clubs or organizations: Not on file    Relationship status: Not on file   Intimate partner violence    Fear of current or ex partner: Not on file    Emotionally abused: Not on file    Physically abused: Not on file    Forced sexual activity: Not on file  Other Topics Concern   Not on file  Social History Narrative   Lives in [place] with husband   Right handed    Family History  Problem Relation Age of Onset   Diabetes Maternal Grandmother    Heart disease Maternal Grandmother    Heart failure Maternal Grandmother    Heart attack Maternal Grandmother    Rheum arthritis Mother    Healthy Father    Heart Problems Maternal Grandfather    Stroke Maternal Grandfather    Atrial fibrillation Paternal Grandmother    Stroke Other          mat great gf had stroke at 96   Cancer Neg Hx     Past Medical History:  Diagnosis Date   Abnormal Pap smear of cervix    Arthritis    Arthritis, rheumatoid (HCC)    Atypical chest pain    Bipolar 1 disorder ([place])    Headache    MIGRAINES   Hyperlipidemia    Hypertension    Morbid obesity ([place])    a. 11/2018 s/p Robotic Sleeve Gastrectomy @ [facility].   Palpitations    a. 09/2017 Zio Monitor: predominantly RSR, avg HR 87, 11 A tach/SVT episodes, fastest 176 x 7 beats, longest 9 beats. Rare PAC's, occas PVC's, rare couplets/triplets.   Persistent Nausea and vomiting    a. 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   Hypokalemia 01/01/2019   Elevated hemoglobin A1c 02/12/2018   Dyslipidemia 02/05/2018   Abnormal skin growth 11/15/2016   PTSD (post-traumatic stress disorder) 10/24/2016   Severe bipolar I disorder, most recent episode depressed without psychotic features ([place]) 10/23/2016   Ankylosing spondylitis ([place]) 10/23/2016   Sacroiliac inflammation ([place]) 08/02/2016   Hypertension 05/28/2016   Vitamin D deficiency 05/02/2016   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   Hypothyroidism 02/13/2016   Anxiety and depression 01/23/2016   Status post tubal ligation 09/22/2015   Morbid (severe) obesity due to excess calories ([place]) 04/25/2015   Anemia, iron deficiency 01/04/2015   Headache, migraine 01/04/2015   Abnormal cytological findings  in female genital organs 07/04/2011    Past Surgical History:  Procedure Laterality Date   CHOLECYSTECTOMY     LAPAROSCOPIC GASTRIC SLEEVE RESECTION  11/19/2018   LAPAROSCOPIC TUBAL LIGATION Bilateral 09/19/2015   Procedure: LAPAROSCOPIC BILATERAL TUBAL BANDING;  Surgeon: Brayton Mars, MD;  Location: [facility] ORS;  Service: Gynecology;  Laterality: Bilateral;    Current Outpatient Medications  Medication Sig Dispense Refill   aspirin 325 MG EC tablet Take 1 tablet (325 mg total) by mouth daily. 30 tablet 0   atorvastatin (LIPITOR) 40 MG tablet Take 1 tablet (40 mg total) by mouth daily at 6 PM. 30 tablet 0   esomeprazole (NEXIUM) 40 MG capsule Take 40 mg by mouth daily.      levothyroxine (SYNTHROID, LEVOTHROID) 25 MCG tablet Take 1 tablet (25 mcg total) by mouth daily before breakfast. 90 tablet 3   OLANZapine zydis (ZYPREXA) 5 MG disintegrating tablet Take 0.5 tablets (2.5 mg total) by mouth 2 (two) times a day. 14 tablet 0   tamsulosin (FLOMAX) 0.4 MG CAPS capsule Take 1 capsule (0.4 mg total) by mouth daily. (Patient not taking: Reported on 02/24/2019) 30 capsule 0   thiamine (VITAMIN B-1) 100 MG tablet Start 1 tab (100mg ) 3 times a day for 2 weeks, then reduce to 1 tab (100mg ) daily for maintenance 90 tablet 1   oxyCODONE-acetaminophen (PERCOCET) 5-325 MG tablet Take 1 tablet by mouth every 4 (four) hours as needed for severe pain. (Patient not taking: Reported on 02/19/2019) 20 tablet 0   propranolol ER (INDERAL LA) 60 MG 24 hr capsule Take 1 capsule (60 mg total) by mouth daily. (Patient not taking: Reported on 02/19/2019) 90 capsule 3   No current facility-administered medications for this visit.     Allergies as of 02/19/2019   (No Known Allergies)    Vitals: BP 128/84 (BP Location: Right Arm, Patient Position: Sitting)    Pulse 79    Temp 98.4 F (36.9 C) Comment: taken by check-in staff   Ht 5\' 1"  (1.549 m)    Wt 266 lb (120.7 kg)    BMI 50.26 kg/m  Last Weight:    Wt Readings from Last 1 Encounters:  02/19/19 266 lb (120.7 kg)   Last Height:   Ht Readings from Last 1 Encounters:  02/19/19 5\' 1"  (1.549 m)     Physical exam: Exam: Gen: NAD, conversant, well nourised, obese, well groomed                     CV:  RRR, no MRG. No Carotid Bruits. No peripheral edema, warm, nontender Eyes: Conjunctivae clear without exudates or hemorrhage  Neuro: Detailed Neurologic Exam  Speech:    Speech is normal; fluent and spontaneous with normal comprehension.  Cognition:    The patient is oriented to person, place, and time;     recent and remote memory intact;     language fluent;     normal attention, concentration,     fund of knowledge Cranial Nerves:    The pupils are equal, round, and reactive to light. The fundi are normal and spontaneous venous pulsations are present. Visual fields are full to finger confrontation. Extraocular movements are intact. Trigeminal sensation is intact and the muscles of mastication are normal. The face is symmetric. The palate elevates in the midline. Hearing intact. Voice is normal. Shoulder shrug is normal. The tongue has normal motion without fasciculations.   Coordination:    Normal finger to nose and heel to shin. Normal rapid alternating movements.   Gait:    Heel-toe and tandem gait are normal.   Motor Observation:    No asymmetry, no atrophy, and no involuntary movements noted. Tone:    Normal muscle tone.    Posture:    Posture is normal. normal erect    Strength:    Strength is V/V in the upper and lower limbs.      Sensation: intact to LT     Reflex Exam:  DTR's:    Deep tendon reflexes in the upper and lower extremities are normal bilaterally.   Toes:    The toes are downgoing bilaterally.   Clonus:    Clonus is absent.    Assessment/Plan:  33 y.o. female here as requested by Nobie Putnam * for Stroke. PMHx PTSD, obesity, HT, HLD, migraines, bipolar 1, migraine w/aura  She has  chronic nausea and vomiting since June since bariatric surgery, she has lost 34 pounds, been dehydrated. She was discharged from [facility] for this, she went to [facility] and they were going to send her home and to follow up with surgery. She had double vision and unsteadiness. She went to the eye doctor, he thought it was Wernicke due to deficiency and MRI showed stroke  splenium of corpus callosum.  Young patient with stroke. Risk factors include obesity, migraine with aura, dehydration (chronic nausea and vomiting since sleeve for obesity), vitamin deficiencies, r/o hypercoag and pfo or other embolic etiology   - Hypercoag panel ordered - Was not on asa prior, on ASA 81mg  now - LDL 70 - TCD - pending - HgbA1c pending - ECHO without pfo or clot. Based on workup abobe may need TEE/Loop - Discussed: There is increased risk for stroke in women with migraine with aura and a contraindication for the combined contraceptive pill for use by women who have migraine with aura. The risk for women with migraine without aura is lower. However other risk factors like smoking are far more likely to increase stroke risk than migraine. There is a recommendation for no smoking and for the use of OCPs without estrogen such as progestogen only pills particularly for women with migraine with aura.Marland Kitchen People who have migraine headaches with auras may be 3 times more likely to have a stroke caused by a blood clot, compared to migraine patients who don't see auras. Women who take hormone-replacement therapy may be 30 percent more likely to suffer a clot-based stroke than women not taking medication containing estrogen. Other risk factors like smoking and high  blood pressure may be  much more important - I had a long d/w patient about her recent stroke, risk for recurrent stroke/TIAs, personally independently reviewed imaging studies and stroke evaluation results and answered questions.Continue ASA for secondary stroke prevention and  maintain strict control of hypertension with blood pressure goal below 130/90, diabetes with hemoglobin A1c goal below 6.5% and lipids with LDL cholesterol goal below 70 mg/dL. I also advised the patient to eat a healthy diet with plenty of whole grains, cereals, fruits and vegetables, exercise regularly and maintain ideal body weight .Followup with Dr. Leonie Man for second opinion then can follow up with me or Janett Billow in the future - needs close f/u with bariatric surgeon for management of vitamin deficiencies and chronic nausea, continue supplement- follow with ophthalmologys  Orders Placed This Encounter  Procedures   Hemoglobin A1c   Cardiolipin antibodies, IgG, IgM, IgA   Prothrombin gene mutation   Factor 5 leiden   Homocysteine   Beta-2-glycoprotein i abs, IgG/M/A   Lupus anticoagulant   Protein S activity   Protein S, total   Protein C activity   Protein C, total   Antithrombin III   CBC   Comprehensive metabolic panel   Vitamin B1   Ambulatory referral to Physical Therapy   Ambulatory referral to Occupational Therapy   VAS [place] TRANSCRANIAL DOPPLER     Cc: Lujean Amel, MD  [facility] [address] [place], [place] [address]  Phone [phone] Fax [phone]

## 2019-02-24 ENCOUNTER — Ambulatory Visit: Payer: BC Managed Care – PPO | Attending: Neurology | Admitting: Occupational Therapy

## 2019-02-24 ENCOUNTER — Encounter: Payer: Self-pay | Admitting: Occupational Therapy

## 2019-02-24 ENCOUNTER — Other Ambulatory Visit: Payer: Self-pay

## 2019-02-24 DIAGNOSIS — M6281 Muscle weakness (generalized): Secondary | ICD-10-CM | POA: Diagnosis not present

## 2019-02-24 DIAGNOSIS — H543 Unqualified visual loss, both eyes: Secondary | ICD-10-CM | POA: Diagnosis not present

## 2019-02-24 DIAGNOSIS — Z8669 Personal history of other diseases of the nervous system and sense organs: Secondary | ICD-10-CM | POA: Diagnosis not present

## 2019-02-24 DIAGNOSIS — I69351 Hemiplegia and hemiparesis following cerebral infarction affecting right dominant side: Secondary | ICD-10-CM | POA: Insufficient documentation

## 2019-02-24 DIAGNOSIS — R2681 Unsteadiness on feet: Secondary | ICD-10-CM | POA: Diagnosis not present

## 2019-02-24 DIAGNOSIS — I639 Cerebral infarction, unspecified: Secondary | ICD-10-CM | POA: Insufficient documentation

## 2019-02-24 DIAGNOSIS — R278 Other lack of coordination: Secondary | ICD-10-CM | POA: Diagnosis not present

## 2019-02-24 NOTE — Therapy (Signed)
[place] MAIN [facility] SERVICES [address] [place], [place], [address] Phone: [phone]   Fax:  [phone]  Occupational Therapy Evaluation  Patient Details  Name: Leah Osborne MRN: [id number] Date of Birth: 1985/07/01 Referring Provider (OT): Dr. Jaynee Eagles   Encounter Date: 02/24/2019  OT End of Session - 02/24/19 1406    Visit Number  1    Number of Visits  12    Date for OT Re-Evaluation  05/19/19    OT Start Time  1000    OT Stop Time  1050    OT Time Calculation (min)  50 min    Activity Tolerance  Patient tolerated treatment well    Behavior During Therapy  [facility] for tasks assessed/performed       Past Medical History:  Diagnosis Date  . Abnormal Pap smear of cervix   . Arthritis   . Arthritis, rheumatoid ([place])   . Atypical chest pain   . Bipolar 1 disorder ([place])   . Headache    MIGRAINES  . Hyperlipidemia   . Hypertension   . Morbid obesity ([place])    a. 11/2018 s/p Robotic Sleeve Gastrectomy @ [facility].  Marland Kitchen Palpitations    a. 09/2017 Zio Monitor: predominantly RSR, avg HR 87, 11 A tach/SVT episodes, fastest 176 x 7 beats, longest 9 beats. Rare PAC's, occas PVC's, rare couplets/triplets.  . Persistent Nausea and vomiting    a. Following sleeve gastrectomy 11/2018  . PTSD (post-traumatic stress disorder)   . PVC (premature ventricular contraction)   . Social phobia   . Stroke [facility])     Past Surgical History:  Procedure Laterality Date  . CHOLECYSTECTOMY    . LAPAROSCOPIC GASTRIC SLEEVE RESECTION  11/19/2018  . LAPAROSCOPIC TUBAL LIGATION Bilateral 09/19/2015   Procedure: LAPAROSCOPIC BILATERAL TUBAL BANDING;  Surgeon: Brayton Mars, MD;  Location: [facility] ORS;  Service: Gynecology;  Laterality: Bilateral;    There were no vitals filed for this visit.  Subjective Assessment - 02/24/19 1351    Subjective   Pt. reports that she has resumed driving.    Pertinent History  Pt. is a 33 y.o. female who was admitted to [facility]  with a CVA, Diplopia. Pt. has had Bariatric Laparoscopic Gastric Sleeve Resection.  Pt. was diagnosed with Bilateral Opthalmoplegia. Pt.'s last visit at [facility] was 02/15/2019 with Dr. George Ina. 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    Currently in Pain?  No/denies        [facility] OT Assessment - 02/24/19 1006      Assessment   Medical Diagnosis  Opthalmoplegia    Referring Provider (OT)  Dr. Jaynee Eagles    Onset Date/Surgical Date  01/10/19    Hand Dominance  Right    Next MD Visit  04/07/2019    Prior Therapy  OT/PT is acute care      Restrictions   Weight Bearing Restrictions  No      Balance Screen   Has the patient fallen in the past 6 months  Yes    How many times?  3    Has the patient had a decrease in activity level because of a fear of falling?   No    Is the patient reluctant to leave their home because of a fear of falling?   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   Leisure  Nothing      ADL   Eating/Feeding  Independent   unsteady when holding a cup with the right, and cutting meat   Grooming  Independent    Upper Body Bathing  Independent    Lower Body Bathing  Independent    Upper Body Dressing  Independent   difficulty reaching   Lower Body Dressing  Increased time   Difficulty  reaching   Toilet Transfer  Independent    Toileting - Clothing Manipulation  Independent    Tub/Shower Transfer  Modified independent    ADL comments  MAM-20 For patiets with Neurological Conditions sum score: 76/80-      IADL   Prior Level of Function Shopping  Independent   Independent   Shopping  Needs to be accompanied on any shopping trip    Prior Level of Function Light Housekeeping  Independent    Light Housekeeping  Performs light daily tasks such as dishwashing, bed making    Prior Level of Function Meal Prep  Independent    Meal Prep  Able to complete simple warm meal prep    Prior Level of Function Sales executive  Drives own vehicle    Prior Level of Function Medication Managment  Independent    Medication Management  Is responsible for taking medication in correct dosages at correct time    Prior Level of Function Loss adjuster, chartered financial matters independently (budgets, writes checks, pays rent, bills goes to bank), collects and keeps track of income      Mobility   Mobility Status  History of falls    Mobility Status Comments  Uses a cane.      Written Expression   Dominant Hand  Right    Handwriting  75% legible      Vision - History   Baseline Vision  --   Wear glasses at night, and contacts during the day.   Patient Visual Report  Peripheral vision impairment    Additional Comments  Pt. initially had diplopia which resolved after 2 weeks. Reports now has "wiggling movements in bilateral peripheral vision.      Vision Assessment   Tracking/Visual Pursuits  Able to track stimulus in all quads without difficulty      Activity Tolerance   Activity Tolerance  Tolerates 10-20 min activity with multiple rests      Cognition   Overall Cognitive Status  Within Functional Limits for tasks assessed      Sensation   Light Touch  Appears Intact    Proprioception  Appears Intact       Coordination  Gross Motor Movements are Fluid and Coordinated  Yes    Right 9 Hole Peg Test  28    Left 9 Hole Peg Test  26      Strength   Overall Strength Comments  BUE strength 4/5 overall      Hand Function   Right Hand Grip (lbs)  35    Right Hand Lateral Pinch  11 lbs    Right Hand 3 Point Pinch  13 lbs    Left Hand Grip (lbs)  34    Left Hand Lateral Pinch  13 lbs    Left 3 point pinch  12 lbs                           OT Long Term Goals - 02/24/19 1436      OT LONG TERM GOAL #1   Title  Pt. will increase Bilateral UE strength to improve ADLs, and IADLs.    Baseline  Eval: BUE strength 4/5 overall    Time  12    Period  Weeks    Status  New    Target Date  05/19/19      OT LONG TERM GOAL #2   Title  Pt. will improve right pinch strength by 2# to be able to turn a key efficiently, and independently    Baseline  Eval: Pt. has difficulty turning a key    Time  12    Period  Weeks    Status  New    Target Date  05/19/19      OT LONG TERM GOAL #3   Title  Pt. will improve right hand [facility] skills by 3 sec. of speed in order to be able pick up, and manipulate small objects during ADLs, and ADLs.    Baseline  Eval: Limited dominant right hand  [facility] skills. Pt. has difficulty manipulating small objects during ADLs, and IADLs.    Time  12    Period  Weeks    Status  New    Target Date  05/19/19      OT LONG TERM GOAL #4   Title  Pt. will independently, and efficiently manipulate small buttons on a shirt with her right hand.    Baseline  Eval: Pt. has difficulty    Time  12    Period  Weeks    Status  New    Target Date  05/19/19      OT LONG TERM GOAL #5   Title  Pt. will write 3-4 senteces with 100% legibility.    Baseline  Eval: Pt. has difficulty    Time  12    Period  Weeks    Status  New    Target Date  05/19/19      Long Term Additional Goals   Additional Long Term Goals  Yes      OT LONG TERM GOAL #6   Title  Pt. will  independently demonstrate visual compensatory strategies 100% of the time during ADLS, and IADLs.    Baseline  Eval: Limited bilateral peripheral visual field    Time  12    Period  Weeks    Status  New    Target Date  05/19/19            Plan - 02/24/19 1404    Clinical Impression Statement  Pt. is a 33 y.o. female who was diagnosed with a CVA, and history of  Bariatric Laparoscopic Sleeve Resection. Pt. initially had Diplopia, which pt. reports has resolved. Pt. reports her vision is now mostly clear with the exception of "wiggling" movements in the bilateral peripheral visual field. Pt. presents with limited BUE strength, right pinch strength, and impaired right hand [facility] skills which limit her ability to complete ADLs, and IADL tasks efficiently. Pt. would benefit from skilled OT services to improve BUE strength, and RUE functioning in order to be able maximize independence with ADLs/IADL tasks, and to be able to safely cut meat efficiently, button clothing, pick up items, turn a key, and write legibly.    OT Occupational Profile and History  Detailed Assessment- Review of Records and additional review of physical, cognitive, psychosocial history related to current functional performance    Occupational performance deficits (Please refer to evaluation for details):  ADL's;IADL's    Body Structure / Function / Physical Skills  ADL;Strength;UE functional use;IADL;ROM;Coordination;FMC;Vision    Rehab Potential  Good    Clinical Decision Making  Several treatment options, min-mod task modification necessary    Modification or Assistance to Complete Evaluation   No modification of tasks or assist necessary to complete eval    OT Frequency  2x / week    OT Duration  12 weeks    OT Treatment/Interventions  Self-care/ADL training;DME and/or AE instruction;Therapeutic activities;Therapeutic exercise;Visual/perceptual remediation/compensation;Patient/family education;Energy conservation    Consulted  and Agree with Plan of Care  Patient       Patient will benefit from skilled therapeutic intervention in order to improve the following deficits and impairments:   Body Structure / Function / Physical Skills: ADL, Strength, UE functional use, IADL, ROM, Coordination, FMC, Vision       Visit Diagnosis: Low vision, both eyes  History of diplopia  Muscle weakness (generalized)  Other lack of coordination    Problem List Patient Active Problem List   Diagnosis Date Noted  . Acute ischemic stroke ([place]) 02/24/2019  . History of cerebrovascular accident (CVA) with residual deficit 01/27/2019  . 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 Intractable nausea and vomiting 01/10/2019  . 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 Hypokalemia 01/01/2019  . Elevated hemoglobin A1c 02/12/2018  . Dyslipidemia 02/05/2018  . Abnormal skin growth 11/15/2016  . PTSD (post-traumatic stress disorder) 10/24/2016  . Severe bipolar I disorder, most recent episode depressed without psychotic features ([place]) 10/23/2016  . Ankylosing spondylitis ([place]) 10/23/2016  . Sacroiliac inflammation ([place]) 08/02/2016  . Hypertension 05/28/2016  . Vitamin D deficiency 05/02/2016  . 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 Hypothyroidism 02/13/2016  . Anxiety and depression 01/23/2016  . Status post tubal ligation 09/22/2015  . Morbid (severe) obesity due to excess calories ([place]) 04/25/2015  . Anemia, iron deficiency 01/04/2015  . Headache, migraine 01/04/2015  . Abnormal cytological findings in female genital organs 07/04/2011    Harrel Carina, MS, OTR/L 02/24/2019, 2:49 PM  [place] MAIN [facility] SERVICES [address] [place], [place], [address] Phone: [phone]   Fax:  [phone]  Name: Leah Osborne MRN: [id number] Date of Birth: 27-Mar-1986

## 2019-02-26 ENCOUNTER — Telehealth: Payer: Self-pay | Admitting: Family Medicine

## 2019-02-26 ENCOUNTER — Ambulatory Visit: Payer: BC Managed Care – PPO | Admitting: Occupational Therapy

## 2019-02-26 ENCOUNTER — Encounter: Payer: Self-pay | Admitting: Family Medicine

## 2019-02-26 ENCOUNTER — Encounter: Payer: Self-pay | Admitting: Occupational Therapy

## 2019-02-26 ENCOUNTER — Other Ambulatory Visit: Payer: Self-pay

## 2019-02-26 DIAGNOSIS — Z8669 Personal history of other diseases of the nervous system and sense organs: Secondary | ICD-10-CM | POA: Diagnosis not present

## 2019-02-26 DIAGNOSIS — M6281 Muscle weakness (generalized): Secondary | ICD-10-CM | POA: Diagnosis not present

## 2019-02-26 DIAGNOSIS — R278 Other lack of coordination: Secondary | ICD-10-CM | POA: Diagnosis not present

## 2019-02-26 DIAGNOSIS — I69351 Hemiplegia and hemiparesis following cerebral infarction affecting right dominant side: Secondary | ICD-10-CM | POA: Diagnosis not present

## 2019-02-26 DIAGNOSIS — R2681 Unsteadiness on feet: Secondary | ICD-10-CM | POA: Diagnosis not present

## 2019-02-26 DIAGNOSIS — H543 Unqualified visual loss, both eyes: Secondary | ICD-10-CM | POA: Diagnosis not present

## 2019-02-26 NOTE — Therapy (Signed)
[place] MAIN [facility] SERVICES [address] [place], [place], [address] Phone: [phone]   Fax:  [phone]  Occupational Therapy Treatment  Patient Details  Name: Leah Osborne MRN: [id number] Date of Birth: 14-Aug-1985 Referring Provider (OT): Dr. Jaynee Eagles   Encounter Date: 02/26/2019  OT End of Session - 02/26/19 1110    Visit Number  3    Number of Visits  12    Date for OT Re-Evaluation  05/19/19    OT Start Time  1105    OT Stop Time  1145    OT Time Calculation (min)  40 min    Activity Tolerance  Patient tolerated treatment well    Behavior During Therapy  [facility] for tasks assessed/performed       Past Medical History:  Diagnosis Date  . Abnormal Pap smear of cervix   . Arthritis   . Arthritis, rheumatoid ([place])   . Atypical chest pain   . Bipolar 1 disorder ([place])   . Headache    MIGRAINES  . Hyperlipidemia   . Hypertension   . Morbid obesity ([place])    a. 11/2018 s/p Robotic Sleeve Gastrectomy @ [facility].  Marland Kitchen Palpitations    a. 09/2017 Zio Monitor: predominantly RSR, avg HR 87, 11 A tach/SVT episodes, fastest 176 x 7 beats, longest 9 beats. Rare PAC's, occas PVC's, rare couplets/triplets.  . Persistent Nausea and vomiting    a. Following sleeve gastrectomy 11/2018  . PTSD (post-traumatic stress disorder)   . PVC (premature ventricular contraction)   . Social phobia   . Stroke [facility])     Past Surgical History:  Procedure Laterality Date  . CHOLECYSTECTOMY    . LAPAROSCOPIC GASTRIC SLEEVE RESECTION  11/19/2018  . LAPAROSCOPIC TUBAL LIGATION Bilateral 09/19/2015   Procedure: LAPAROSCOPIC BILATERAL TUBAL BANDING;  Surgeon: Brayton Mars, MD;  Location: [facility] ORS;  Service: Gynecology;  Laterality: Bilateral;    There were no vitals filed for this visit.  Subjective Assessment - 02/26/19 1024    Subjective   Pt. reports that she has resumed driving.    Pertinent History  Pt. is a 33 y.o. female who was admitted to [facility]  with a CVA, Diplopia. Pt. has had Bariatric Laparoscopic Gastric Sleeve Resection.  Pt. was diagnosed with Bilateral Opthalmoplegia. Pt.'s last visit at [facility] was 02/15/2019 with Dr. George Ina. 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    Patient Stated Goals  To improve [place] sills    Currently in Pain?  No/denies      OT TREATMENT    Therapeutic Exercise:   Pt. performed 2# dowel ex. For UE strengthening secondary to weakness. Bilateral shoulder flexion, chest press, circular patterns, and elbow flexion/extension were performed. 2# dumbbell ex. for elbow flexion and extension, forearm supination/pronation, wrist flexion/extension, and radial deviation. Pt. requires rest breaks and verbal cues, and visual demonstration for proper technique.  Pt. worked on green thearputty ex. for hand strengthening. Exercises included: gross gripping, gross digit extension, thumb abduction, lateral, and 3pt. pinch strengthening, digit abduction, and thumb opposition. Pt. was provided with a visual handout HEP through [place] with an access code.  Neuromuscular Re-education:  Pt. Worked on [facility] skills grasping, and manipulating small items on the [employer], and worked on moving them objects through her hand before positioning them onto the pegboard.  OT Education - 02/26/19 1110    Education Details  UE strength, and [place] skills    Person(s) Educated  Patient    Methods  Explanation    Comprehension  Verbalized understanding       OT Short Term Goals - 02/24/19 1419      OT SHORT TERM GOAL #1   Title  --    Baseline  --    Time  --    Period  --    Status  --    Target Date  --      OT SHORT TERM GOAL #2   Title  --    Baseline  --    Time  --    Period  --    Status  --    Target Date  --      OT SHORT TERM GOAL #3   Title  --    Baseline  --    Time  --    Period  --    Status  --    Target  Date  --      OT SHORT TERM GOAL #4   Title  --    Baseline  --    Time  --    Period  --    Status  --    Target Date  --      OT SHORT TERM GOAL #5   Title  --    Baseline  --    Time  --    Period  --    Status  --    Target Date  --      OT SHORT TERM GOAL #6   Title  --    Baseline  --    Time  --    Period  --    Status  --    Target Date  --        OT Long Term Goals - 02/24/19 1436      OT LONG TERM GOAL #1   Title  Pt. will increase Bilateral UE strength to improve ADLs, and IADLs.    Baseline  Eval: BUE strength 4/5 overall    Time  12    Period  Weeks    Status  New    Target Date  05/19/19      OT LONG TERM GOAL #2   Title  Pt. will improve right pinch strength by 2# to be able to turn a key efficiently, and independently    Baseline  Eval: Pt. has difficulty turning a key    Time  12    Period  Weeks    Status  New    Target Date  05/19/19      OT LONG TERM GOAL #3   Title  Pt. will improve right hand [facility] skills by 3 sec. of speed in order to be able pick up, and manipulate small objects during ADLs, and ADLs.    Baseline  Eval: Limited dominant right hand  [facility] skills. Pt. has difficulty manipulating small objects during ADLs, and IADLs.    Time  12    Period  Weeks    Status  New    Target Date  05/19/19      OT LONG TERM GOAL #4   Title  Pt. will independently, and efficiently manipulate small buttons on a shirt with her right hand.    Baseline  Eval: Pt. has difficulty    Time  12    Period  Weeks    Status  New    Target Date  05/19/19      OT LONG TERM GOAL #5   Title  Pt. will write 3-4 senteces with 100% legibility.    Baseline  Eval: Pt. has difficulty    Time  12    Period  Weeks    Status  New    Target Date  05/19/19      Long Term Additional Goals   Additional Long Term Goals  Yes      OT LONG TERM GOAL #6   Title  Pt. will independently demonstrate visual compensatory strategies 100% of the time during ADLS, and  IADLs.    Baseline  Eval: Limited bilateral peripheral visual field    Time  12    Period  Weeks    Status  New    Target Date  05/19/19            Plan - 02/26/19 1116    Clinical Impression Statement  Pt. reports that driving continues to go well for her. Pt. continues to present with limited BUE strength, and [facility] skills. Pt. continues to work towards increasing RUE functioning, and coordination skills in order to increase engagement, and efficiency with ADLs,  IADLs, and writing tasks in order to be able to return to work.   OT Occupational Profile and History  Detailed Assessment- Review of Records and additional review of physical, cognitive, psychosocial history related to current functional performance    Occupational performance deficits (Please refer to evaluation for details):  ADL's;IADL's    Body Structure / Function / Physical Skills  ADL;Strength;UE functional use;IADL;ROM;Coordination;FMC;Vision    Rehab Potential  Good    Clinical Decision Making  Several treatment options, min-mod task modification necessary    Modification or Assistance to Complete Evaluation   No modification of tasks or assist necessary to complete eval    OT Frequency  2x / week    OT Duration  12 weeks    OT Treatment/Interventions  Self-care/ADL training;DME and/or AE instruction;Therapeutic activities;Therapeutic exercise;Visual/perceptual remediation/compensation;Patient/family education;Energy conservation    Consulted and Agree with Plan of Care  Patient       Patient will benefit from skilled therapeutic intervention in order to improve the following deficits and impairments:   Body Structure / Function / Physical Skills: ADL, Strength, UE functional use, IADL, ROM, Coordination, FMC, Vision       Visit Diagnosis: Muscle weakness (generalized)    Problem List Patient Active Problem List   Diagnosis Date Noted  . Acute ischemic stroke ([place]) 02/24/2019  . History of  cerebrovascular accident (CVA) with residual deficit 01/27/2019  . 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 Intractable nausea and vomiting 01/10/2019  . 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 Hypokalemia 01/01/2019  . Elevated hemoglobin A1c 02/12/2018  . Dyslipidemia 02/05/2018  . Abnormal skin growth 11/15/2016  . PTSD (post-traumatic stress disorder) 10/24/2016  . Severe bipolar I disorder, most recent episode depressed without psychotic features ([place]) 10/23/2016  . Ankylosing spondylitis ([place]) 10/23/2016  . Sacroiliac inflammation ([place]) 08/02/2016  . Hypertension 05/28/2016  . Vitamin D deficiency 05/02/2016  . 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 Hypothyroidism 02/13/2016  . Anxiety and depression 01/23/2016  . Status post tubal ligation 09/22/2015  . Morbid (severe) obesity due to excess calories ([place]) 04/25/2015  . Anemia, iron deficiency 01/04/2015  .  Headache, migraine 01/04/2015  . Abnormal cytological findings in female genital organs 07/04/2011    Harrel Carina, MS, OTR/L 02/26/2019, 6:56 PM  [place] MAIN [facility] SERVICES [address] [place], [place], [address] Phone: [phone]   Fax:  [phone]  Name: Leah Osborne MRN: [id number] Date of Birth: 07/08/1985

## 2019-02-26 NOTE — Telephone Encounter (Signed)
Leah Osborne for Short Term Disability Claim  Incident Number - [id number] Policy # [id number]  We received fax on 02/24/19 for request of copies of office visit notes from 02/09/19 to present and test results.  Uncertain if this was submitted initially back on 02/19/19 as requested.  I have called [facility], talked to representative, last time I saw patient in office was 01/27/19, they stated that I should write a letter with that date on it saying last time I saw patient face to face. And can include the other records hospital and outpatient specialist neurology and OT note from 8/24 on.  Printed copies, letter printed, to be faxed on 02/27/19.  Nobie Putnam, [facility] 02/26/2019, 4:57 PM

## 2019-02-27 LAB — CBC
Hematocrit: 41.7 % (ref 34.0–46.6)
Hemoglobin: 13.4 g/dL (ref 11.1–15.9)
MCH: 28.8 pg (ref 26.6–33.0)
MCHC: 32.1 g/dL (ref 31.5–35.7)
MCV: 90 fL (ref 79–97)
Platelets: 380 10*3/uL (ref 150–450)
RBC: 4.65 x10E6/uL (ref 3.77–5.28)
RDW: 15.3 % (ref 11.7–15.4)
WBC: 7 10*3/uL (ref 3.4–10.8)

## 2019-02-27 LAB — COMPREHENSIVE METABOLIC PANEL
ALT: 36 IU/L — ABNORMAL HIGH (ref 0–32)
AST: 30 IU/L (ref 0–40)
Albumin/Globulin Ratio: 1.8 (ref 1.2–2.2)
Albumin: 4.3 g/dL (ref 3.8–4.8)
Alkaline Phosphatase: 75 IU/L (ref 39–117)
BUN/Creatinine Ratio: 12 (ref 9–23)
BUN: 9 mg/dL (ref 6–20)
Bilirubin Total: 0.4 mg/dL (ref 0.0–1.2)
CO2: 23 mmol/L (ref 20–29)
Calcium: 9.6 mg/dL (ref 8.7–10.2)
Chloride: 104 mmol/L (ref 96–106)
Creatinine, Ser: 0.78 mg/dL (ref 0.57–1.00)
GFR calc Af Amer: 117 mL/min/{1.73_m2} (ref >59)
GFR calc non Af Amer: 102 mL/min/{1.73_m2} (ref >59)
Globulin, Total: 2.4 g/dL (ref 1.5–4.5)
Glucose: 86 mg/dL (ref 65–99)
Potassium: 4.3 mmol/L (ref 3.5–5.2)
Sodium: 142 mmol/L (ref 134–144)
Total Protein: 6.7 g/dL (ref 6.0–8.5)

## 2019-02-27 LAB — LUPUS ANTICOAGULANT
Dilute Viper Venom Time: 28.4 s (ref 0.0–47.0)
PTT Lupus Anticoagulant: 32.8 s (ref 0.0–51.9)
Thrombin Time: 15.4 s (ref 0.0–23.0)
dPT Confirm Ratio: 0.82 Ratio (ref 0.00–1.40)
dPT: 31.3 s (ref 0.0–55.0)

## 2019-02-27 LAB — HEMOGLOBIN A1C
Est. average glucose Bld gHb Est-mCnc: 94 mg/dL
Hgb A1c MFr Bld: 4.9 % (ref 4.8–5.6)

## 2019-02-27 LAB — VITAMIN B1: Thiamine: 126.4 nmol/L (ref 66.5–200.0)

## 2019-02-27 LAB — PROTEIN S ACTIVITY: Protein S Activity: 104 % (ref 63–140)

## 2019-02-27 LAB — PROTHROMBIN GENE MUTATION

## 2019-02-27 LAB — PROTEIN C, TOTAL: Protein C Antigen: 104 % (ref 60–150)

## 2019-02-27 LAB — BETA-2-GLYCOPROTEIN I ABS, IGG/M/A
Beta-2 Glyco 1 IgA: <9 GPI IgA units (ref 0–25)
Beta-2 Glyco 1 IgM: <9 GPI IgM units (ref 0–32)
Beta-2 Glyco I IgG: <9 GPI IgG units (ref 0–20)

## 2019-02-27 LAB — HOMOCYSTEINE: Homocysteine: 14.4 umol/L (ref 0.0–14.5)

## 2019-02-27 LAB — PROTEIN S, TOTAL: Protein S Ag, Total: 103 % (ref 60–150)

## 2019-02-27 LAB — PROTEIN C ACTIVITY: Protein C Activity: 135 % (ref 73–180)

## 2019-02-27 LAB — ANTITHROMBIN III: AntiThromb III Func: 105 % (ref 75–135)

## 2019-02-27 LAB — FACTOR 5 LEIDEN

## 2019-03-03 ENCOUNTER — Other Ambulatory Visit: Payer: Self-pay

## 2019-03-03 ENCOUNTER — Encounter: Payer: Self-pay | Admitting: Occupational Therapy

## 2019-03-03 ENCOUNTER — Ambulatory Visit: Payer: BC Managed Care – PPO | Admitting: Occupational Therapy

## 2019-03-03 DIAGNOSIS — I69351 Hemiplegia and hemiparesis following cerebral infarction affecting right dominant side: Secondary | ICD-10-CM | POA: Diagnosis not present

## 2019-03-03 DIAGNOSIS — R278 Other lack of coordination: Secondary | ICD-10-CM | POA: Diagnosis not present

## 2019-03-03 DIAGNOSIS — M6281 Muscle weakness (generalized): Secondary | ICD-10-CM

## 2019-03-03 DIAGNOSIS — R2681 Unsteadiness on feet: Secondary | ICD-10-CM | POA: Diagnosis not present

## 2019-03-03 DIAGNOSIS — H543 Unqualified visual loss, both eyes: Secondary | ICD-10-CM | POA: Diagnosis not present

## 2019-03-03 DIAGNOSIS — Z8669 Personal history of other diseases of the nervous system and sense organs: Secondary | ICD-10-CM | POA: Diagnosis not present

## 2019-03-03 NOTE — Therapy (Signed)
[place] MAIN [facility] SERVICES [address] [place], [place], [address] Phone: [phone]   Fax:  [phone]  Occupational Therapy Treatment  Patient Details  Name: Leah Osborne MRN: [id number] Date of Birth: [birth year] Referring Provider (OT): Dr. Jaynee Eagles   Encounter Date: 03/03/2019  OT End of Session - 03/03/19 1033    Visit Number  4    Number of Visits  12    Date for OT Re-Evaluation  05/19/19    Authorization Time Period  Progress Report period starting 02/26/19    OT Start Time  1030    OT Stop Time  1100    OT Time Calculation (min)  30 min    Activity Tolerance  Patient tolerated treatment well    Behavior During Therapy  [facility] for tasks assessed/performed       Past Medical History:  Diagnosis Date  . Abnormal Pap smear of cervix   . Arthritis   . Arthritis, rheumatoid ([place])   . Atypical chest pain   . Bipolar 1 disorder ([place])   . Headache    MIGRAINES  . Hyperlipidemia   . Hypertension   . Morbid obesity ([place])    a. 11/2018 s/p Robotic Sleeve Gastrectomy @ [facility].  Marland Kitchen Palpitations    a. 09/2017 Zio Monitor: predominantly RSR, avg HR 87, 11 A tach/SVT episodes, fastest 176 x 7 beats, longest 9 beats. Rare PAC's, occas PVC's, rare couplets/triplets.  . Persistent Nausea and vomiting    a. Following sleeve gastrectomy 11/2018  . PTSD (post-traumatic stress disorder)   . PVC (premature ventricular contraction)   . Social phobia   . Stroke [facility])     Past Surgical History:  Procedure Laterality Date  . CHOLECYSTECTOMY    . LAPAROSCOPIC GASTRIC SLEEVE RESECTION  11/19/2018  . LAPAROSCOPIC TUBAL LIGATION Bilateral 09/19/2015   Procedure: LAPAROSCOPIC BILATERAL TUBAL BANDING;  Surgeon: Brayton Mars, MD;  Location: [facility] ORS;  Service: Gynecology;  Laterality: Bilateral;    There were no vitals filed for this visit.  Subjective Assessment - 03/03/19 1055    Subjective   Pt. reports hip, and back pain    Pertinent  History  Pt. is a 33 y.o. female who was admitted to [facility] with a CVA, Diplopia. Pt. has had Bariatric Laparoscopic Gastric Sleeve Resection.  Pt. was diagnosed with Bilateral Opthalmoplegia. Pt.'s last visit at [facility] was 02/15/2019 with Dr. George Ina. 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    Currently in Pain?  Yes    Pain Score  5     Pain Location  Back   Back, and left hip pain     OT TREATMENT    Neuro muscular re-education:  Pt. worked on grasping 1" resistive cubes alternating thumb opposition to the tip of the 2nd through 5th digits while the board is placed at a vertical angle. Pt. worked on pressing the cubes back into place while alternating isolated 2nd through 5th digit extension. Pt. tolerated the task well, requiring cues initially for hand position. Pt. worked on grasping, and manipulating 1/2" washers from a magnetic dish using point grasp pattern. Pt. worked on reaching up, stabilizing, and sustaining shoulder elevation while placing the washer over a small precise target on vertical dowels positioned at various angles.   Therapeutic Exercise:  Pt. performed gross gripping with grip strengthener. Pt. worked on sustaining grip while grasping pegs and reaching at various heights. The gripper was was  set at 17.9# of grip strength force. Pt. worked on pinch strengthening in the left hand for lateral, and 3pt. pinch using yellow, red, green, and blue resistive clips. Pt. worked on placing the clips at various vertical and horizontal angles. Tactile and verbal cues were required for eliciting the desired movement.                         OT Education - 03/03/19 1033    Education Details  UE strength, and [facility] skills    Person(s) Educated  Patient    Methods  Explanation    Comprehension  Verbalized understanding         OT Long Term Goals - 02/24/19 1436      OT LONG TERM GOAL #1   Title  Pt. will increase  Bilateral UE strength to improve ADLs, and IADLs.    Baseline  Eval: BUE strength 4/5 overall    Time  12    Period  Weeks    Status  New    Target Date  05/19/19      OT LONG TERM GOAL #2   Title  Pt. will improve right pinch strength by 2# to be able to turn a key efficiently, and independently    Baseline  Eval: Pt. has difficulty turning a key    Time  12    Period  Weeks    Status  New    Target Date  05/19/19      OT LONG TERM GOAL #3   Title  Pt. will improve right hand [facility] skills by 3 sec. of speed in order to be able pick up, and manipulate small objects during ADLs, and ADLs.    Baseline  Eval: Limited dominant right hand  [facility] skills. Pt. has difficulty manipulating small objects during ADLs, and IADLs.    Time  12    Period  Weeks    Status  New    Target Date  05/19/19      OT LONG TERM GOAL #4   Title  Pt. will independently, and efficiently manipulate small buttons on a shirt with her right hand.    Baseline  Eval: Pt. has difficulty    Time  12    Period  Weeks    Status  New    Target Date  05/19/19      OT LONG TERM GOAL #5   Title  Pt. will write 3-4 senteces with 100% legibility.    Baseline  Eval: Pt. has difficulty    Time  12    Period  Weeks    Status  New    Target Date  05/19/19      Long Term Additional Goals   Additional Long Term Goals  Yes      OT LONG TERM GOAL #6   Title  Pt. will independently demonstrate visual compensatory strategies 100% of the time during ADLS, and IADLs.    Baseline  Eval: Limited bilateral peripheral visual field    Time  12    Period  Weeks    Status  New    Target Date  05/19/19            Plan - 03/03/19 1048    Clinical Impression Statement  Pt. was late for the session due to parking. Pt. reports having back, and hip pain. Pt. reports feeling a stretch in her RUE which started  over the weekend. Pt.  reports that she ihas been doing eexercises at home. Pt. is making progress overall with RUE strength,  and [facility] skills. Pt. continues to work on these skills in order to improve UE functioning during ADLs, and IADL tasks.    OT Occupational Profile and History  Detailed Assessment- Review of Records and additional review of physical, cognitive, psychosocial history related to current functional performance    Occupational performance deficits (Please refer to evaluation for details):  ADL's;IADL's    Body Structure / Function / Physical Skills  ADL;Strength;UE functional use;IADL;ROM;Coordination;FMC;Vision    Rehab Potential  Good    Clinical Decision Making  Several treatment options, min-mod task modification necessary    Modification or Assistance to Complete Evaluation   No modification of tasks or assist necessary to complete eval    OT Frequency  2x / week    OT Duration  12 weeks    OT Treatment/Interventions  Self-care/ADL training;DME and/or AE instruction;Therapeutic activities;Therapeutic exercise;Visual/perceptual remediation/compensation;Patient/family education;Energy conservation    Consulted and Agree with Plan of Care  Patient       Patient will benefit from skilled therapeutic intervention in order to improve the following deficits and impairments:   Body Structure / Function / Physical Skills: ADL, Strength, UE functional use, IADL, ROM, Coordination, FMC, Vision       Visit Diagnosis: Muscle weakness (generalized)  Other lack of coordination    Problem List Patient Active Problem List   Diagnosis Date Noted  . Acute ischemic stroke ([place]) 02/24/2019  . History of cerebrovascular accident (CVA) with residual deficit 01/27/2019  . 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 Intractable nausea and vomiting 01/10/2019  . 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 Hypokalemia 01/01/2019  . Elevated hemoglobin A1c 02/12/2018  . Dyslipidemia 02/05/2018  . Abnormal skin growth 11/15/2016  . PTSD  (post-traumatic stress disorder) 10/24/2016  . Severe bipolar I disorder, most recent episode depressed without psychotic features ([place]) 10/23/2016  . Ankylosing spondylitis ([place]) 10/23/2016  . Sacroiliac inflammation ([place]) 08/02/2016  . Hypertension 05/28/2016  . Vitamin D deficiency 05/02/2016  . 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 Hypothyroidism 02/13/2016  . Anxiety and depression 01/23/2016  . Status post tubal ligation 09/22/2015  . Morbid (severe) obesity due to excess calories ([place]) 04/25/2015  . Anemia, iron deficiency 01/04/2015  . Headache, migraine 01/04/2015  . Abnormal cytological findings in female genital organs 07/04/2011    Harrel Carina, MS, OTR/L 03/03/2019, 11:23 AM  [place] MAIN [facility] SERVICES [address] [place], [place], [address] Phone: [phone]   Fax:  [phone]  Name: Leah Osborne MRN: [id number] Date of Birth: December 31, 1985

## 2019-03-05 ENCOUNTER — Encounter: Payer: Self-pay | Admitting: Occupational Therapy

## 2019-03-05 ENCOUNTER — Other Ambulatory Visit: Payer: Self-pay

## 2019-03-05 ENCOUNTER — Ambulatory Visit: Payer: BC Managed Care – PPO | Admitting: Occupational Therapy

## 2019-03-05 DIAGNOSIS — I69351 Hemiplegia and hemiparesis following cerebral infarction affecting right dominant side: Secondary | ICD-10-CM | POA: Diagnosis not present

## 2019-03-05 DIAGNOSIS — M6281 Muscle weakness (generalized): Secondary | ICD-10-CM

## 2019-03-05 DIAGNOSIS — R2681 Unsteadiness on feet: Secondary | ICD-10-CM | POA: Diagnosis not present

## 2019-03-05 DIAGNOSIS — Z8669 Personal history of other diseases of the nervous system and sense organs: Secondary | ICD-10-CM | POA: Diagnosis not present

## 2019-03-05 DIAGNOSIS — R278 Other lack of coordination: Secondary | ICD-10-CM

## 2019-03-05 DIAGNOSIS — Z0289 Encounter for other administrative examinations: Secondary | ICD-10-CM

## 2019-03-05 DIAGNOSIS — H543 Unqualified visual loss, both eyes: Secondary | ICD-10-CM | POA: Diagnosis not present

## 2019-03-05 NOTE — Therapy (Signed)
[place] MAIN [facility] SERVICES [address] [place], [place], [address] Phone: [phone]   Fax:  [phone]  Occupational Therapy Treatment  Patient Details  Name: Leah Osborne MRN: [id number] Date of Birth: December 18, 1985 Referring Provider (OT): Dr. Jaynee Eagles   Encounter Date: 03/05/2019  OT End of Session - 03/05/19 1059    Visit Number  5    Number of Visits  12    Date for OT Re-Evaluation  05/19/19    OT Start Time  1022    OT Stop Time  1100    OT Time Calculation (min)  38 min    Activity Tolerance  Patient tolerated treatment well    Behavior During Therapy  [facility] for tasks assessed/performed       Past Medical History:  Diagnosis Date  . Abnormal Pap smear of cervix   . Arthritis   . Arthritis, rheumatoid ([place])   . Atypical chest pain   . Bipolar 1 disorder ([place])   . Headache    MIGRAINES  . Hyperlipidemia   . Hypertension   . Morbid obesity ([place])    a. 11/2018 s/p Robotic Sleeve Gastrectomy @ [facility].  Marland Kitchen Palpitations    a. 09/2017 Zio Monitor: predominantly RSR, avg HR 87, 11 A tach/SVT episodes, fastest 176 x 7 beats, longest 9 beats. Rare PAC's, occas PVC's, rare couplets/triplets.  . Persistent Nausea and vomiting    a. Following sleeve gastrectomy 11/2018  . PTSD (post-traumatic stress disorder)   . PVC (premature ventricular contraction)   . Social phobia   . Stroke [facility])     Past Surgical History:  Procedure Laterality Date  . CHOLECYSTECTOMY    . LAPAROSCOPIC GASTRIC SLEEVE RESECTION  11/19/2018  . LAPAROSCOPIC TUBAL LIGATION Bilateral 09/19/2015   Procedure: LAPAROSCOPIC BILATERAL TUBAL BANDING;  Surgeon: Brayton Mars, MD;  Location: [facility] ORS;  Service: Gynecology;  Laterality: Bilateral;    There were no vitals filed for this visit.  Subjective Assessment - 03/05/19 1058    Subjective   Pt. reports hip, and back pain    Pertinent History  Pt. is a 33 y.o. female who was admitted to [facility] with a CVA,  Diplopia. Pt. has had Bariatric Laparoscopic Gastric Sleeve Resection.  Pt. was diagnosed with Bilateral Opthalmoplegia. Pt.'s last visit at [facility] was 02/15/2019 with Dr. George Ina. 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    Patient Stated Goals  To improve [place] sills    Currently in Pain?  No/denies      OT TREATMENT    Neuro muscular re-education:  Pt. worked on right [facility] skills grasping, flipping, turning, 4 rows of [place] style discs. Pt. required visual demonstration, and cues for movement patterns. Pt. worked on speed, and coordination skills with endurance.  Therapeutic Exercise:  Pt. Performed right gross gripping with grip strengthener. Pt. worked on sustaining grip while grasping pegs and reaching at various heights. The gripper was set at 17.9# of restive force. Pt. Worked on pinch strengthening in the right  hand for lateral, and 3pt. pinch using yellow, red, green, and blue resistive clips. Pt. worked on placing the clips at various vertical and horizontal angles. Tactile and verbal cues were required for eliciting the desired movement.  Self-Care:  Pt. worked on Media planner in print, and cursive form using a standard pen. Pt. printed with 100% legibility, and 75% legibility in cursive form for 4 sentences each. Pt.'s 4th cursive sentence  deviated above the line.   Response to Treatment  Pt. reports no pain today. Pt. is making progress overall with RUE strength, and [facility] skills. Pt. is is using her hand more during ADLs, and IADLs. Pt. continues to present with limited RUE strength, grip strength, pinch strength, and [place] skill, and continues to work on these skills in order to be able to manipulate objects during ADLs, and IADLs, button efficiently, turn a key, and  write legibly.                     OT Education - 03/05/19 1059    Education Details  UE strength, and [place] skills    Person(s) Educated  Patient     Methods  Explanation    Comprehension  Verbalized understanding       OT Short Term Goals - 02/24/19 1419      OT SHORT TERM GOAL #1   Title  --    Baseline  --    Time  --    Period  --    Status  --    Target Date  --      OT SHORT TERM GOAL #2   Title  --    Baseline  --    Time  --    Period  --    Status  --    Target Date  --      OT SHORT TERM GOAL #3   Title  --    Baseline  --    Time  --    Period  --    Status  --    Target Date  --      OT SHORT TERM GOAL #4   Title  --    Baseline  --    Time  --    Period  --    Status  --    Target Date  --      OT SHORT TERM GOAL #5   Title  --    Baseline  --    Time  --    Period  --    Status  --    Target Date  --      OT SHORT TERM GOAL #6   Title  --    Baseline  --    Time  --    Period  --    Status  --    Target Date  --        OT Long Term Goals - 02/24/19 1436      OT LONG TERM GOAL #1   Title  Pt. will increase Bilateral UE strength to improve ADLs, and IADLs.    Baseline  Eval: BUE strength 4/5 overall    Time  12    Period  Weeks    Status  New    Target Date  05/19/19      OT LONG TERM GOAL #2   Title  Pt. will improve right pinch strength by 2# to be able to turn a key efficiently, and independently    Baseline  Eval: Pt. has difficulty turning a key    Time  12    Period  Weeks    Status  New    Target Date  05/19/19      OT LONG TERM GOAL #3   Title  Pt. will improve right hand [facility] skills by 3 sec. of speed in order to be able  pick up, and manipulate small objects during ADLs, and ADLs.    Baseline  Eval: Limited dominant right hand  [facility] skills. Pt. has difficulty manipulating small objects during ADLs, and IADLs.    Time  12    Period  Weeks    Status  New    Target Date  05/19/19      OT LONG TERM GOAL #4   Title  Pt. will independently, and efficiently manipulate small buttons on a shirt with her right hand.    Baseline  Eval: Pt. has difficulty    Time  12     Period  Weeks    Status  New    Target Date  05/19/19      OT LONG TERM GOAL #5   Title  Pt. will write 3-4 senteces with 100% legibility.    Baseline  Eval: Pt. has difficulty    Time  12    Period  Weeks    Status  New    Target Date  05/19/19      Long Term Additional Goals   Additional Long Term Goals  Yes      OT LONG TERM GOAL #6   Title  Pt. will independently demonstrate visual compensatory strategies 100% of the time during ADLS, and IADLs.    Baseline  Eval: Limited bilateral peripheral visual field    Time  12    Period  Weeks    Status  New    Target Date  05/19/19            Plan - 03/05/19 1100    Clinical Impression Statement  Pt. reports no pain today. Pt. is making progress overall with RUE strength, and [facility] skills. Pt. is is using her hand more during ADLs, and IADLs. Pt. continues to present with limited RUE strength, grip strength, pinch strength, and [place] skill, and continues to work on these skills in order to be able to manipulate objects during ADLs, and IADLs, button efficiently, turn a key, and  write legibly.    OT Occupational Profile and History  Detailed Assessment- Review of Records and additional review of physical, cognitive, psychosocial history related to current functional performance    Occupational performance deficits (Please refer to evaluation for details):  ADL's;IADL's    Body Structure / Function / Physical Skills  ADL;Strength;UE functional use;IADL;ROM;Coordination;FMC;Vision    Rehab Potential  Good    Clinical Decision Making  Several treatment options, min-mod task modification necessary    Modification or Assistance to Complete Evaluation   No modification of tasks or assist necessary to complete eval    OT Frequency  2x / week    OT Duration  12 weeks    OT Treatment/Interventions  Self-care/ADL training;DME and/or AE instruction;Therapeutic activities;Therapeutic exercise;Visual/perceptual  remediation/compensation;Patient/family education;Energy conservation    Consulted and Agree with Plan of Care  Patient       Patient will benefit from skilled therapeutic intervention in order to improve the following deficits and impairments:   Body Structure / Function / Physical Skills: ADL, Strength, UE functional use, IADL, ROM, Coordination, FMC, Vision       Visit Diagnosis: Muscle weakness (generalized)  Other lack of coordination    Problem List Patient Active Problem List   Diagnosis Date Noted  . Acute ischemic stroke ([place]) 02/24/2019  . History of cerebrovascular accident (CVA) with residual deficit 01/27/2019  . 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  Intractable nausea and vomiting 01/10/2019  . 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 Hypokalemia 01/01/2019  . Elevated hemoglobin A1c 02/12/2018  . Dyslipidemia 02/05/2018  . Abnormal skin growth 11/15/2016  . PTSD (post-traumatic stress disorder) 10/24/2016  . Severe bipolar I disorder, most recent episode depressed without psychotic features ([place]) 10/23/2016  . Ankylosing spondylitis ([place]) 10/23/2016  . Sacroiliac inflammation ([place]) 08/02/2016  . Hypertension 05/28/2016  . Vitamin D deficiency 05/02/2016  . 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 Hypothyroidism 02/13/2016  . Anxiety and depression 01/23/2016  . Status post tubal ligation 09/22/2015  . Morbid (severe) obesity due to excess calories ([place]) 04/25/2015  . Anemia, iron deficiency 01/04/2015  . Headache, migraine 01/04/2015  . Abnormal cytological findings in female genital organs 07/04/2011    Harrel Carina, MS, OTR/L 03/05/2019, 11:28 AM  [place] MAIN [facility] SERVICES [address] [place], [place], [address] Phone: [phone]   Fax:  [phone]  Name: Leah Osborne MRN:  [id number] Date of Birth: 03-25-1986

## 2019-03-09 ENCOUNTER — Ambulatory Visit: Payer: BC Managed Care – PPO | Admitting: Occupational Therapy

## 2019-03-10 ENCOUNTER — Ambulatory Visit (INDEPENDENT_AMBULATORY_CARE_PROVIDER_SITE_OTHER): Payer: BC Managed Care – PPO | Admitting: Family Medicine

## 2019-03-10 ENCOUNTER — Other Ambulatory Visit: Payer: Self-pay

## 2019-03-10 ENCOUNTER — Encounter: Payer: Self-pay | Admitting: Family Medicine

## 2019-03-10 DIAGNOSIS — I6930 Unspecified sequelae of cerebral infarction: Secondary | ICD-10-CM

## 2019-03-10 NOTE — Progress Notes (Signed)
Virtual Visit via Telephone The purpose of this virtual visit is to provide medical care while limiting exposure to the novel coronavirus (COVID19) for both patient and office staff.  Consent was obtained for phone visit:  Yes.   Answered questions that patient had about telehealth interaction:  Yes.   I discussed the limitations, risks, security and privacy concerns of performing an evaluation and management service by telephone. I also discussed with the patient that there may be a patient responsible charge related to this service. The patient expressed understanding and agreed to proceed.  Patient Location: Home Provider Location: [facility])  ---------------------------------------------------------------------- Chief Complaint  Patient presents with  . Stroke Symptoms    S: Reviewed CMA documentation. I have called patient and gathered additional HPI as follows:  History of CVA with residual deficits Followed now by [facility] Neurology Dr [place], seen on 02/19/19, has proceeded with OT and now will start PT tomorrow, working on coordination, her vision has restored, now to work on balance more and strength. She was recommended to get 2nd opinion from Dr Leonie Man regarding other diagnostic for CVA cause and TEE testing, she has not been scheduled yet for that testing. - She is doing well currently, gradually improving. No new concerns today. - She says that Dr Jaynee Eagles Neurology is completing her short term disability paperwork at this time, was anticipated to be out for 12 weeks since her visit from 02/19/19  Bariatric Surgery / nausea vomiting Improved GI symptoms nausea vomiting now, on current vitamin supplements, nutrition, and overall has improved.  Denies any high risk travel to areas of current concern for COVID19. Denies any known or suspected exposure to person with or possibly with COVID19.  Denies any fevers, chills, sweats, body ache, cough, shortness of  breath, sinus pain or pressure, headache, abdominal pain, diarrhea  Past Medical History:  Diagnosis Date  . Abnormal Pap smear of cervix   . Arthritis   . Arthritis, rheumatoid ([place])   . Atypical chest pain   . Bipolar 1 disorder ([place])   . Headache    MIGRAINES  . Hyperlipidemia   . Hypertension   . Morbid obesity ([place])    a. 11/2018 s/p Robotic Sleeve Gastrectomy @ [facility].  Marland Kitchen Palpitations    a. 09/2017 Zio Monitor: predominantly RSR, avg HR 87, 11 A tach/SVT episodes, fastest 176 x 7 beats, longest 9 beats. Rare PAC's, occas PVC's, rare couplets/triplets.  . Persistent Nausea and vomiting    a. Following sleeve gastrectomy 11/2018  . PTSD (post-traumatic stress disorder)   . PVC (premature ventricular contraction)   . Social phobia   . Stroke [facility])    Social History   Tobacco Use  . Smoking status: Never Smoker  . Smokeless tobacco: Never Used  Substance Use Topics  . Alcohol use: Not Currently    Alcohol/week: 0.0 standard drinks  . Drug use: No    Current Outpatient Medications:  .  aspirin 325 MG EC tablet, Take 1 tablet (325 mg total) by mouth daily., Disp: 30 tablet, Rfl: 0 .  atorvastatin (LIPITOR) 40 MG tablet, Take 1 tablet (40 mg total) by mouth daily at 6 PM., Disp: 30 tablet, Rfl: 0 .  esomeprazole (NEXIUM) 40 MG capsule, Take 40 mg by mouth daily. , Disp: , Rfl:  .  levothyroxine (SYNTHROID, LEVOTHROID) 25 MCG tablet, Take 1 tablet (25 mcg total) by mouth daily before breakfast., Disp: 90 tablet, Rfl: 3 .  OLANZapine zydis (ZYPREXA) 5 MG disintegrating  tablet, Take 0.5 tablets (2.5 mg total) by mouth 2 (two) times a day., Disp: 14 tablet, Rfl: 0 .  thiamine (VITAMIN B-1) 100 MG tablet, Start 1 tab (100mg ) 3 times a day for 2 weeks, then reduce to 1 tab (100mg ) daily for maintenance, Disp: 90 tablet, Rfl: 1  Depression screen [facility] 2/9 01/27/2019 01/08/2019 10/21/2018  Decreased Interest 0 0 0  Down, Depressed, Hopeless 0 0 0  PHQ - 2 Score 0 0 0  Altered sleeping - -  0  Tired, decreased energy - - 0  Change in appetite - - 0  Feeling bad or failure about yourself  - - 0  Trouble concentrating - - 0  Moving slowly or fidgety/restless - - 2  Suicidal thoughts - - 0  PHQ-9 Score - - 2  Difficult doing work/chores - - Somewhat difficult    GAD 7 : Generalized Anxiety Score 10/21/2018 09/30/2018 04/17/2018 09/07/2016  Nervous, Anxious, on Edge 2 0 0 3  Control/stop worrying 2 1 0 3  Worry too much - different things 2 2 0 3  Trouble relaxing 2 0 0 1  Restless 3 0 0 0  Easily annoyed or irritable 3 3 0 3  Afraid - awful might happen 1 3 0 3  Total GAD 7 Score 15 9 0 16  Anxiety Difficulty Extremely difficult Not difficult at all Not difficult at all Somewhat difficult    -------------------------------------------------------------------------- O: No physical exam performed due to remote telephone encounter.  Lab results reviewed.  Recent Results (from the past 2160 hour(s))  CBC with Differential     Status: Abnormal   Collection Time: 01/01/19  1:09 PM  Result Value Ref Range   WBC 9.9 4.0 - 10.5 K/uL   RBC 5.87 (H) 3.87 - 5.11 MIL/uL   Hemoglobin 16.5 (H) 12.0 - 15.0 g/dL   HCT 49.1 (H) 36.0 - 46.0 %   MCV 83.6 80.0 - 100.0 fL   MCH 28.1 26.0 - 34.0 pg   MCHC 33.6 30.0 - 36.0 g/dL   RDW 14.5 11.5 - 15.5 %   Platelets 352 150 - 400 K/uL   nRBC 0.0 0.0 - 0.2 %   Neutrophils Relative % 71 %   Neutro Abs 7.0 1.7 - 7.7 K/uL   Lymphocytes Relative 16 %   Lymphs Abs 1.6 0.7 - 4.0 K/uL   Monocytes Relative 12 %   Monocytes Absolute 1.2 (H) 0.1 - 1.0 K/uL   Eosinophils Relative 1 %   Eosinophils Absolute 0.1 0.0 - 0.5 K/uL   Basophils Relative 0 %   Basophils Absolute 0.0 0.0 - 0.1 K/uL   Immature Granulocytes 0 %   Abs Immature Granulocytes 0.03 0.00 - 0.07 K/uL    Comment: Performed at [facility], [address] [place], [place] [address]  Comprehensive metabolic panel     Status: Abnormal   Collection Time: 01/01/19  1:09  PM  Result Value Ref Range   Sodium 136 135 - 145 mmol/L   Potassium 2.8 (L) 3.5 - 5.1 mmol/L   Chloride 91 (L) 98 - 111 mmol/L   CO2 27 22 - 32 mmol/L   Glucose, Bld 108 (H) 70 - 99 mg/dL   BUN 15 6 - 20 mg/dL   Creatinine, Ser 0.93 0.44 - 1.00 mg/dL   Calcium 9.0 8.9 - 10.3 mg/dL   Total Protein 6.8 6.5 - 8.1 g/dL   Albumin 3.8 3.5 - 5.0 g/dL   AST 28 15 -  41 U/L   ALT 27 0 - 44 U/L   Alkaline Phosphatase 61 38 - 126 U/L   Total Bilirubin 1.7 (H) 0.3 - 1.2 mg/dL   GFR calc non Af Amer >60 >60 mL/min   GFR calc Af Amer >60 >60 mL/min   Anion gap 18 (H) 5 - 15    Comment: Performed at [facility], [address] [place], [place] [address]  Lipase, blood     Status: Abnormal   Collection Time: 01/01/19  1:09 PM  Result Value Ref Range   Lipase 98 (H) 11 - 51 U/L    Comment: Performed at [facility], [address] [place], [place] [address]  Urinalysis, Routine w reflex microscopic     Status: Abnormal   Collection Time: 01/01/19  1:09 PM  Result Value Ref Range   Color, Urine AMBER (A) YELLOW    Comment: BIOCHEMICALS MAY BE AFFECTED BY COLOR   APPearance CLOUDY (A) CLEAR   Specific Gravity, Urine 1.027 1.005 - 1.030   pH 6.0 5.0 - 8.0   Glucose, UA NEGATIVE NEGATIVE mg/dL   Hgb urine dipstick LARGE (A) NEGATIVE   Bilirubin Urine SMALL (A) NEGATIVE   Ketones, ur 80 (A) NEGATIVE mg/dL   Protein, ur 100 (A) NEGATIVE mg/dL   Nitrite NEGATIVE NEGATIVE   Leukocytes,Ua SMALL (A) NEGATIVE    Comment: Performed at [facility], [address] [place], [place] [address]  Magnesium     Status: None   Collection Time: 01/01/19  1:09 PM  Result Value Ref Range   Magnesium 2.0 1.7 - 2.4 mg/dL    Comment: Performed at [facility], [address] [place], [place] [address]  TSH     Status: None   Collection Time: 01/01/19  1:09 PM  Result Value Ref Range   TSH 3.225 0.350 - 4.500 uIU/mL    Comment: Performed by a 3rd Generation assay  with a functional sensitivity of <=0.01 uIU/mL. Performed at [facility], [address] [place], [place] [address]   T4, free     Status: None   Collection Time: 01/01/19  1:09 PM  Result Value Ref Range   Free T4 1.01 0.61 - 1.12 ng/dL    Comment: (NOTE) Biotin ingestion may interfere with free T4 tests. If the results are inconsistent with the TSH level, previous test results, or the clinical presentation, then consider biotin interference. If needed, order repeat testing after stopping biotin. Performed at [facility], [address] [place], [place] [address]   Urinalysis, Complete w Microscopic     Status: Abnormal   Collection Time: 01/01/19  1:12 PM  Result Value Ref Range   Color, Urine AMBER (A) YELLOW    Comment: BIOCHEMICALS MAY BE AFFECTED BY COLOR   APPearance CLOUDY (A) CLEAR   Specific Gravity, Urine 1.027 1.005 - 1.030   pH 6.0 5.0 - 8.0   Glucose, UA NEGATIVE NEGATIVE mg/dL   Hgb urine dipstick LARGE (A) NEGATIVE   Bilirubin Urine SMALL (A) NEGATIVE   Ketones, ur 80 (A) NEGATIVE mg/dL   Protein, ur 100 (A) NEGATIVE mg/dL   Nitrite NEGATIVE NEGATIVE   Leukocytes,Ua SMALL (A) NEGATIVE   Squamous Epithelial / LPF 11-20 0 - 5   WBC, UA 11-20 0 - 5 WBC/hpf   RBC / HPF >50 0 - 5 RBC/hpf   Bacteria, UA RARE (A) NONE SEEN   Mucus PRESENT     Comment: Performed at [facility], [address] [place], [place] [address]  SARS Coronavirus 2 (CEPHEID - Performed in [facility] hospital lab), Hosp Order     Status: None   Collection Time: 01/01/19  4:55 PM   Specimen: Nasopharyngeal Swab  Result Value Ref Range   SARS Coronavirus 2 NEGATIVE NEGATIVE    Comment: (NOTE) If result is NEGATIVE SARS-CoV-2 target nucleic acids are NOT DETECTED. The SARS-CoV-2 RNA is generally detectable in upper and lower  respiratory specimens during the acute phase of infection. The lowest  concentration of SARS-CoV-2 viral copies this assay can detect  is 250  copies / mL. A negative result does not preclude SARS-CoV-2 infection  and should not be used as the sole basis for treatment or other  patient management decisions.  A negative result may occur with  improper specimen collection / handling, submission of specimen other  than nasopharyngeal swab, presence of viral mutation(s) within the  areas targeted by this assay, and inadequate number of viral copies  (<250 copies / mL). A negative result must be combined with clinical  observations, patient history, and epidemiological information. If result is POSITIVE SARS-CoV-2 target nucleic acids are DETECTED. The SARS-CoV-2 RNA is generally detectable in upper and lower  respiratory specimens dur ing the acute phase of infection.  Positive  results are indicative of active infection with SARS-CoV-2.  Clinical  correlation with patient history and other diagnostic information is  necessary to determine patient infection status.  Positive results do  not rule out bacterial infection or co-infection with other viruses. If result is PRESUMPTIVE POSTIVE SARS-CoV-2 nucleic acids MAY BE PRESENT.   A presumptive positive result was obtained on the submitted specimen  and confirmed on repeat testing.  While 2019 novel coronavirus  (SARS-CoV-2) nucleic acids may be present in the submitted sample  additional confirmatory testing may be necessary for epidemiological  and / or clinical management purposes  to differentiate between  SARS-CoV-2 and other Sarbecovirus currently known to infect humans.  If clinically indicated additional testing with an alternate test  methodology [phone]) is advised. The SARS-CoV-2 RNA is generally  detectable in upper and lower respiratory sp ecimens during the acute  phase of infection. The expected result is Negative. Fact Sheet for Patients:  StrictlyIdeas.no Fact Sheet for Healthcare  Providers: BankingDealers.co.za This test is not yet approved or cleared by the [place] FDA and has been authorized for detection and/or diagnosis of SARS-CoV-2 by FDA under an Emergency Use Authorization (EUA).  This EUA will remain in effect (meaning this test can be used) for the duration of the COVID-19 declaration under Section 564(b)(1) of the Act, 21 U.S.C. section 360bbb-3(b)(1), unless the authorization is terminated or revoked sooner. Performed at [facility], [address] [place], [place] [address]   HIV antibody (Routine Testing)     Status: None   Collection Time: 01/02/19  5:24 AM  Result Value Ref Range   HIV Screen 4th Generation wRfx Non Reactive Non Reactive    Comment: (NOTE) Performed At: [facility] [place], [place] [id number] Rush Farmer MD [id number]   Basic metabolic panel     Status: Abnormal   Collection Time: 01/02/19  5:24 AM  Result Value Ref Range   Sodium 140 135 - 145 mmol/L   Potassium 3.2 (L) 3.5 - 5.1 mmol/L   Chloride 103 98 - 111 mmol/L   CO2 25 22 - 32 mmol/L   Glucose, Bld 85 70 - 99 mg/dL  BUN 11 6 - 20 mg/dL   Creatinine, Ser 0.81 0.44 - 1.00 mg/dL   Calcium 8.2 (L) 8.9 - 10.3 mg/dL   GFR calc non Af Amer >60 >60 mL/min   GFR calc Af Amer >60 >60 mL/min   Anion gap 12 5 - 15    Comment: Performed at [facility], [address] [place], [place] [address]  CBC     Status: Abnormal   Collection Time: 01/02/19  5:24 AM  Result Value Ref Range   WBC 7.5 4.0 - 10.5 K/uL   RBC 5.26 (H) 3.87 - 5.11 MIL/uL   Hemoglobin 14.8 12.0 - 15.0 g/dL   HCT 45.1 36.0 - 46.0 %   MCV 85.7 80.0 - 100.0 fL   MCH 28.1 26.0 - 34.0 pg   MCHC 32.8 30.0 - 36.0 g/dL   RDW 14.8 11.5 - 15.5 %   Platelets 291 150 - 400 K/uL   nRBC 0.0 0.0 - 0.2 %    Comment: Performed at [facility], [address] [place], [place] [address]  COMPLETE METABOLIC PANEL WITH GFR      Status: Abnormal   Collection Time: 01/07/19  9:41 AM  Result Value Ref Range   Glucose, Bld 76 65 - 99 mg/dL    Comment: .            Fasting reference interval .    BUN 9 7 - 25 mg/dL   Creat 0.73 0.50 - 1.10 mg/dL   GFR, Est Non African American 111 > OR = 60 mL/min/1.32m2   GFR, Est African American 128 > OR = 60 mL/min/1.22m2   BUN/Creatinine Ratio NOT APPLICABLE 6 - 22 (calc)   Sodium 139 135 - 146 mmol/L   Potassium 3.4 (L) 3.5 - 5.3 mmol/L   Chloride 95 (L) 98 - 110 mmol/L   CO2 26 20 - 32 mmol/L   Calcium 9.3 8.6 - 10.2 mg/dL   Total Protein 5.8 (L) 6.1 - 8.1 g/dL   Albumin 3.7 3.6 - 5.1 g/dL   Globulin 2.1 1.9 - 3.7 g/dL (calc)   AG Ratio 1.8 1.0 - 2.5 (calc)   Total Bilirubin 0.7 0.2 - 1.2 mg/dL   Alkaline phosphatase (APISO) 57 31 - 125 U/L   AST 22 10 - 30 U/L   ALT 18 6 - 29 U/L  Urine Culture     Status: Abnormal   Collection Time: 01/10/19  7:59 AM   Specimen: Urine, Random  Result Value Ref Range   Specimen Description      URINE, RANDOM Performed at [facility], [address], [place], [place] [address]    Special Requests      NONE Performed at [facility], [address], [place], [place] [address]    Culture MULTIPLE SPECIES PRESENT, SUGGEST RECOLLECTION (A)    Report Status 01/12/2019 FINAL   Lipase, blood     Status: Abnormal   Collection Time: 01/10/19  7:35 PM  Result Value Ref Range   Lipase 76 (H) 11 - 51 U/L    Comment: Performed at [facility], [address] [place], [place] [address]  Comprehensive metabolic panel     Status: Abnormal   Collection Time: 01/10/19  7:35 PM  Result Value Ref Range   Sodium 138 135 - 145 mmol/L   Potassium 3.2 (L) 3.5 - 5.1 mmol/L   Chloride 97 (L) 98 - 111 mmol/L   CO2 23 22 - 32 mmol/L  Glucose, Bld 91 70 - 99 mg/dL   BUN 9 6 - 20 mg/dL   Creatinine, Ser 0.71 0.44 - 1.00 mg/dL   Calcium 9.1 8.9 - 10.3 mg/dL   Total Protein 7.3 6.5 - 8.1 g/dL   Albumin 4.1 3.5 -  5.0 g/dL   AST 30 15 - 41 U/L   ALT 28 0 - 44 U/L   Alkaline Phosphatase 71 38 - 126 U/L   Total Bilirubin 1.5 (H) 0.3 - 1.2 mg/dL   GFR calc non Af Amer >60 >60 mL/min   GFR calc Af Amer >60 >60 mL/min   Anion gap 18 (H) 5 - 15    Comment: Performed at [facility], [address] [place], [place] [address]  CBC     Status: Abnormal   Collection Time: 01/10/19  7:35 PM  Result Value Ref Range   WBC 10.3 4.0 - 10.5 K/uL   RBC 5.88 (H) 3.87 - 5.11 MIL/uL   Hemoglobin 16.6 (H) 12.0 - 15.0 g/dL   HCT 49.5 (H) 36.0 - 46.0 %   MCV 84.2 80.0 - 100.0 fL   MCH 28.2 26.0 - 34.0 pg   MCHC 33.5 30.0 - 36.0 g/dL   RDW 15.1 11.5 - 15.5 %   Platelets 378 150 - 400 K/uL   nRBC 0.0 0.0 - 0.2 %    Comment: Performed at [facility], [address] [place], [place] [address]  Urinalysis, Complete w Microscopic     Status: Abnormal   Collection Time: 01/10/19 10:43 PM  Result Value Ref Range   Color, Urine AMBER (A) YELLOW    Comment: BIOCHEMICALS MAY BE AFFECTED BY COLOR   APPearance CLOUDY (A) CLEAR   Specific Gravity, Urine 1.028 1.005 - 1.030   pH 6.0 5.0 - 8.0   Glucose, UA NEGATIVE NEGATIVE mg/dL   Hgb urine dipstick MODERATE (A) NEGATIVE   Bilirubin Urine MODERATE (A) NEGATIVE   Ketones, ur 80 (A) NEGATIVE mg/dL   Protein, ur 100 (A) NEGATIVE mg/dL   Nitrite NEGATIVE NEGATIVE   Leukocytes,Ua SMALL (A) NEGATIVE   RBC / HPF 6-10 0 - 5 RBC/hpf   WBC, UA 21-50 0 - 5 WBC/hpf   Bacteria, UA RARE (A) NONE SEEN   Squamous Epithelial / LPF 6-10 0 - 5   Mucus PRESENT     Comment: Performed at [facility], [address] [place], [place] [address]  SARS Coronavirus 2 (CEPHEID - Performed in [place] hospital lab), Hosp Order     Status: None   Collection Time: 01/10/19 10:51 PM   Specimen: Nasopharyngeal Swab  Result Value Ref Range   SARS Coronavirus 2 NEGATIVE NEGATIVE    Comment: (NOTE) If result is NEGATIVE SARS-CoV-2 target nucleic acids are NOT  DETECTED. The SARS-CoV-2 RNA is generally detectable in upper and lower  respiratory specimens during the acute phase of infection. The lowest  concentration of SARS-CoV-2 viral copies this assay can detect is 250  copies / mL. A negative result does not preclude SARS-CoV-2 infection  and should not be used as the sole basis for treatment or other  patient management decisions.  A negative result may occur with  improper specimen collection / handling, submission of specimen other  than nasopharyngeal swab, presence of viral mutation(s) within the  areas targeted by this assay, and inadequate number of viral copies  (<250 copies / mL). A negative result must be combined with clinical  observations, patient history, and epidemiological  information. If result is POSITIVE SARS-CoV-2 target nucleic acids are DETECTED. The SARS-CoV-2 RNA is generally detectable in upper and lower  respiratory specimens dur ing the acute phase of infection.  Positive  results are indicative of active infection with SARS-CoV-2.  Clinical  correlation with patient history and other diagnostic information is  necessary to determine patient infection status.  Positive results do  not rule out bacterial infection or co-infection with other viruses. If result is PRESUMPTIVE POSTIVE SARS-CoV-2 nucleic acids MAY BE PRESENT.   A presumptive positive result was obtained on the submitted specimen  and confirmed on repeat testing.  While 2019 novel coronavirus  (SARS-CoV-2) nucleic acids may be present in the submitted sample  additional confirmatory testing may be necessary for epidemiological  and / or clinical management purposes  to differentiate between  SARS-CoV-2 and other Sarbecovirus currently known to infect humans.  If clinically indicated additional testing with an alternate test  methodology [phone]) is advised. The SARS-CoV-2 RNA is generally  detectable in upper and lower respiratory sp ecimens during  the acute  phase of infection. The expected result is Negative. Fact Sheet for Patients:  StrictlyIdeas.no Fact Sheet for Healthcare Providers: BankingDealers.co.za This test is not yet approved or cleared by the [place] FDA and has been authorized for detection and/or diagnosis of SARS-CoV-2 by FDA under an Emergency Use Authorization (EUA).  This EUA will remain in effect (meaning this test can be used) for the duration of the COVID-19 declaration under Section 564(b)(1) of the Act, 21 U.S.C. section 360bbb-3(b)(1), unless the authorization is terminated or revoked sooner. Performed at [facility], [address] [place], [place] [id number]   Basic metabolic panel     Status: Abnormal   Collection Time: 01/11/19  4:16 AM  Result Value Ref Range   Sodium 141 135 - 145 mmol/L   Potassium 3.1 (L) 3.5 - 5.1 mmol/L   Chloride 104 98 - 111 mmol/L   CO2 27 22 - 32 mmol/L   Glucose, Bld 103 (H) 70 - 99 mg/dL   BUN 7 6 - 20 mg/dL   Creatinine, Ser 0.57 0.44 - 1.00 mg/dL   Calcium 8.3 (L) 8.9 - 10.3 mg/dL   GFR calc non Af Amer >60 >60 mL/min   GFR calc Af Amer >60 >60 mL/min   Anion gap 10 5 - 15    Comment: Performed at [facility], [address] [place], [place] [address]  CBC     Status: Abnormal   Collection Time: 01/11/19  4:16 AM  Result Value Ref Range   WBC 10.2 4.0 - 10.5 K/uL   RBC 5.18 (H) 3.87 - 5.11 MIL/uL   Hemoglobin 14.4 12.0 - 15.0 g/dL   HCT 43.6 36.0 - 46.0 %   MCV 84.2 80.0 - 100.0 fL   MCH 27.8 26.0 - 34.0 pg   MCHC 33.0 30.0 - 36.0 g/dL   RDW 14.7 11.5 - 15.5 %   Platelets 307 150 - 400 K/uL   nRBC 0.0 0.0 - 0.2 %    Comment: Performed at [facility], [address] [place], [place] [address]  TSH     Status: Abnormal   Collection Time: 01/11/19  4:16 AM  Result Value Ref Range   TSH 4.609 (H) 0.350 - 4.500 uIU/mL    Comment: Performed by a 3rd Generation assay with  a functional sensitivity of <=0.01 uIU/mL. Performed at [facility], [address] [place], [place] [address]  Vitamin B12     Status: None   Collection Time: 01/11/19  2:59 PM  Result Value Ref Range   Vitamin B-12 389 180 - 914 pg/mL    Comment: (NOTE) This assay is not validated for testing neonatal or myeloproliferative syndrome specimens for Vitamin B12 levels. Performed at [facility], [place] [address] [place], [place] [address]   VITAMIN D 25 Hydroxy (Vit-D Deficiency, Fractures)     Status: Abnormal   Collection Time: 01/11/19  2:59 PM  Result Value Ref Range   Vit D, 25-Hydroxy 22.9 (L) 30.0 - 100.0 ng/mL    Comment: (NOTE) Vitamin D deficiency has been defined by the Institute of Medicine and an Endocrine Society practice guideline as a level of serum 25-OH vitamin D less than 20 ng/mL (1,2). The Endocrine Society went on to further define vitamin D insufficiency as a level between 21 and 29 ng/mL (2). 1. IOM (Institute of Medicine). 2010. Dietary reference   intakes for calcium and D. [place]: The   [employer]. 2. Holick MF, Binkley Rayle, Bischoff-Ferrari HA, et al.   Evaluation, treatment, and prevention of vitamin D   deficiency: an Endocrine Society clinical practice   guideline. JCEM. 2011 Jul; 96(7):1911-30. Performed At: [facility] [place], [place] [id number] Rush Farmer MD [id number]   Zinc     Status: None   Collection Time: 01/11/19  2:59 PM  Result Value Ref Range   Zinc 84 56 - 134 ug/dL    Comment: (NOTE) This test was developed and its performance characteristics determined by [facility]. It has not been cleared or approved by the Food and Drug Administration.                                Detection Limit = 5 Performed At: [facility] [place], [place] [id number] Rush Farmer MD [id number]   Magnesium     Status: None   Collection Time: 01/11/19  2:59  PM  Result Value Ref Range   Magnesium 1.9 1.7 - 2.4 mg/dL    Comment: Performed at [facility], [address] [place], [place] [id number]  Basic metabolic panel     Status: Abnormal   Collection Time: 01/12/19  4:09 AM  Result Value Ref Range   Sodium 142 135 - 145 mmol/L   Potassium 3.2 (L) 3.5 - 5.1 mmol/L   Chloride 106 98 - 111 mmol/L   CO2 25 22 - 32 mmol/L   Glucose, Bld 79 70 - 99 mg/dL   BUN <5 (L) 6 - 20 mg/dL   Creatinine, Ser 0.53 0.44 - 1.00 mg/dL   Calcium 8.3 (L) 8.9 - 10.3 mg/dL   GFR calc non Af Amer >60 >60 mL/min   GFR calc Af Amer >60 >60 mL/min   Anion gap 11 5 - 15    Comment: Performed at [facility], [address] [place], [place] [address]  T4, free     Status: Abnormal   Collection Time: 01/12/19  4:09 AM  Result Value Ref Range   Free T4 1.15 (H) 0.61 - 1.12 ng/dL    Comment: (NOTE) Biotin ingestion may interfere with free T4 tests. If the results are inconsistent with the TSH level, previous test results, or the clinical presentation, then consider biotin interference. If needed, order repeat testing after stopping biotin. Performed at [facility], [address] [place], [place] [address]   CBC     Status: Abnormal   Collection Time: 01/15/19  5:21 PM  Result Value Ref Range   WBC 11.4 (H) 4.0 - 10.5 K/uL   RBC 5.80 (H) 3.87 - 5.11 MIL/uL   Hemoglobin 16.5 (H) 12.0 - 15.0 g/dL   HCT 49.5 (H) 36.0 - 46.0 %   MCV 85.3 80.0 - 100.0 fL   MCH 28.4 26.0 - 34.0 pg   MCHC 33.3 30.0 - 36.0 g/dL   RDW 15.1 11.5 - 15.5 %   Platelets 376 150 - 400 K/uL   nRBC 0.0 0.0 - 0.2 %    Comment: Performed at [facility], [address] [place], [place] [address]  Comprehensive metabolic panel     Status: Abnormal   Collection Time: 01/15/19  5:21 PM  Result Value Ref Range   Sodium 137 135 - 145 mmol/L   Potassium 3.1 (L) 3.5 - 5.1 mmol/L   Chloride 97 (L) 98 - 111 mmol/L   CO2 24 22 - 32 mmol/L   Glucose,  Bld 91 70 - 99 mg/dL   BUN 6 6 - 20 mg/dL   Creatinine, Ser 0.68 0.44 - 1.00 mg/dL   Calcium 9.4 8.9 - 10.3 mg/dL   Total Protein 7.1 6.5 - 8.1 g/dL   Albumin 3.9 3.5 - 5.0 g/dL   AST 62 (H) 15 - 41 U/L   ALT 67 (H) 0 - 44 U/L   Alkaline Phosphatase 87 38 - 126 U/L   Total Bilirubin 1.6 (H) 0.3 - 1.2 mg/dL   GFR calc non Af Amer >60 >60 mL/min   GFR calc Af Amer >60 >60 mL/min   Anion gap 16 (H) 5 - 15    Comment: Performed at [facility], [address] [place], [place] [address]  Magnesium     Status: None   Collection Time: 01/15/19  5:21 PM  Result Value Ref Range   Magnesium 1.8 1.7 - 2.4 mg/dL    Comment: Performed at [facility], [address] [place], [place] [address]  Vitamin B1     Status: Abnormal   Collection Time: 01/15/19  5:21 PM  Result Value Ref Range   Vitamin B1 (Thiamine) 53.7 (L) 66.5 - 200.0 nmol/L    Comment: (NOTE) This test was developed and its performance characteristics determined by [facility]. It has not been cleared or approved by the Food and Drug Administration. Performed At: [facility] [place], [place] [id number] Rush Farmer MD [id number]   TSH     Status: None   Collection Time: 01/15/19  5:21 PM  Result Value Ref Range   TSH 3.779 0.350 - 4.500 uIU/mL    Comment: Performed by a 3rd Generation assay with a functional sensitivity of <=0.01 uIU/mL. Performed at [facility], [address] [place], [place] [address]   SARS Coronavirus 2 (CEPHEID- Performed in [facility] hospital lab), Hosp Order     Status: None   Collection Time: 01/16/19  4:10 AM   Specimen: Nasopharyngeal Swab  Result Value Ref Range   SARS Coronavirus 2 NEGATIVE NEGATIVE    Comment: (NOTE) If result is NEGATIVE SARS-CoV-2 target nucleic acids are NOT DETECTED. The SARS-CoV-2 RNA is generally detectable in upper and lower  respiratory specimens during the acute phase of infection. The lowest   concentration of SARS-CoV-2 viral copies this assay can detect is 250  copies / mL. A negative result  does not preclude SARS-CoV-2 infection  and should not be used as the sole basis for treatment or other  patient management decisions.  A negative result may occur with  improper specimen collection / handling, submission of specimen other  than nasopharyngeal swab, presence of viral mutation(s) within the  areas targeted by this assay, and inadequate number of viral copies  (<250 copies / mL). A negative result must be combined with clinical  observations, patient history, and epidemiological information. If result is POSITIVE SARS-CoV-2 target nucleic acids are DETECTED. The SARS-CoV-2 RNA is generally detectable in upper and lower  respiratory specimens dur ing the acute phase of infection.  Positive  results are indicative of active infection with SARS-CoV-2.  Clinical  correlation with patient history and other diagnostic information is  necessary to determine patient infection status.  Positive results do  not rule out bacterial infection or co-infection with other viruses. If result is PRESUMPTIVE POSTIVE SARS-CoV-2 nucleic acids MAY BE PRESENT.   A presumptive positive result was obtained on the submitted specimen  and confirmed on repeat testing.  While 2019 novel coronavirus  (SARS-CoV-2) nucleic acids may be present in the submitted sample  additional confirmatory testing may be necessary for epidemiological  and / or clinical management purposes  to differentiate between  SARS-CoV-2 and other Sarbecovirus currently known to infect humans.  If clinically indicated additional testing with an alternate test  methodology [phone]) is advised. The SARS-CoV-2 RNA is generally  detectable in upper and lower respiratory sp ecimens during the acute  phase of infection. The expected result is Negative. Fact Sheet for Patients:  StrictlyIdeas.no Fact Sheet  for Healthcare Providers: BankingDealers.co.za This test is not yet approved or cleared by the [place] FDA and has been authorized for detection and/or diagnosis of SARS-CoV-2 by FDA under an Emergency Use Authorization (EUA).  This EUA will remain in effect (meaning this test can be used) for the duration of the COVID-19 declaration under Section 564(b)(1) of the Act, 21 U.S.C. section 360bbb-3(b)(1), unless the authorization is terminated or revoked sooner. Performed at [facility], [address] [place], [place] [address]   Lipid panel     Status: Abnormal   Collection Time: 01/16/19  5:35 AM  Result Value Ref Range   Cholesterol 108 0 - 200 mg/dL   Triglycerides 84 <150 mg/dL   HDL 21 (L) >40 mg/dL   Total CHOL/HDL Ratio 5.1 RATIO   VLDL 17 0 - 40 mg/dL   LDL Cholesterol 70 0 - 99 mg/dL    Comment:        Total Cholesterol/HDL:CHD Risk Coronary Heart Disease Risk Table                     Men   Women  1/2 Average Risk   3.4   3.3  Average Risk       5.0   4.4  2 X Average Risk   9.6   7.1  3 X Average Risk  23.4   11.0        Use the calculated Patient Ratio above and the CHD Risk Table to determine the patient's CHD Risk.        ATP III CLASSIFICATION (LDL):  <100     mg/dL   Optimal  100-129  mg/dL   Near or Above                    Optimal  130-159  mg/dL  Borderline  160-189  mg/dL   High  >190     mg/dL   Very High Performed at [facility], [address] [place], [place] [address]   CBC     Status: None   Collection Time: 01/16/19  5:35 AM  Result Value Ref Range   WBC 10.4 4.0 - 10.5 K/uL   RBC 4.88 3.87 - 5.11 MIL/uL   Hemoglobin 13.9 12.0 - 15.0 g/dL   HCT 41.0 36.0 - 46.0 %   MCV 84.0 80.0 - 100.0 fL   MCH 28.5 26.0 - 34.0 pg   MCHC 33.9 30.0 - 36.0 g/dL   RDW 15.1 11.5 - 15.5 %   Platelets 296 150 - 400 K/uL   nRBC 0.0 0.0 - 0.2 %    Comment: Performed at [facility], [address] [place], [place] [id number]  Basic metabolic panel     Status: Abnormal   Collection Time: 01/16/19  5:35 AM  Result Value Ref Range   Sodium 138 135 - 145 mmol/L   Potassium 2.9 (L) 3.5 - 5.1 mmol/L   Chloride 101 98 - 111 mmol/L   CO2 26 22 - 32 mmol/L   Glucose, Bld 88 70 - 99 mg/dL   BUN 8 6 - 20 mg/dL   Creatinine, Ser 0.54 0.44 - 1.00 mg/dL   Calcium 8.6 (L) 8.9 - 10.3 mg/dL   GFR calc non Af Amer >60 >60 mL/min   GFR calc Af Amer >60 >60 mL/min   Anion gap 11 5 - 15    Comment: Performed at [facility], [address] [place], [place] [address]  ECHOCARDIOGRAM COMPLETE     Status: None   Collection Time: 01/16/19  1:14 PM  Result Value Ref Range   Weight 4,123.48 oz   Height 61 in   BP 121/92 mmHg  Basic metabolic panel     Status: Abnormal   Collection Time: 01/17/19  4:27 AM  Result Value Ref Range   Sodium 141 135 - 145 mmol/L   Potassium 3.3 (L) 3.5 - 5.1 mmol/L   Chloride 106 98 - 111 mmol/L   CO2 25 22 - 32 mmol/L   Glucose, Bld 85 70 - 99 mg/dL   BUN 10 6 - 20 mg/dL   Creatinine, Ser 0.59 0.44 - 1.00 mg/dL   Calcium 8.7 (L) 8.9 - 10.3 mg/dL   GFR calc non Af Amer >60 >60 mL/min   GFR calc Af Amer >60 >60 mL/min   Anion gap 10 5 - 15    Comment: Performed at [facility], [address] [place], [place] [id number]  Basic metabolic panel     Status: Abnormal   Collection Time: 01/18/19  5:49 AM  Result Value Ref Range   Sodium 142 135 - 145 mmol/L   Potassium 3.4 (L) 3.5 - 5.1 mmol/L   Chloride 109 98 - 111 mmol/L   CO2 28 22 - 32 mmol/L   Glucose, Bld 97 70 - 99 mg/dL   BUN 10 6 - 20 mg/dL   Creatinine, Ser 0.73 0.44 - 1.00 mg/dL   Calcium 8.6 (L) 8.9 - 10.3 mg/dL   GFR calc non Af Amer >60 >60 mL/min   GFR calc Af Amer >60 >60 mL/min   Anion gap 5 5 - 15    Comment: Performed at [facility], [address] [place], [place] [address]  Magnesium     Status: None   Collection  Time: 01/18/19  5:49 AM  Result Value  Ref Range   Magnesium 1.8 1.7 - 2.4 mg/dL    Comment: Performed at [facility], [address] [place], [place] [address]  Magnesium     Status: None   Collection Time: 01/28/19 10:12 AM  Result Value Ref Range   Magnesium 2.3 1.5 - 2.5 mg/dL  VITAMIN D 25 Hydroxy (Vit-D Deficiency, Fractures)     Status: Abnormal   Collection Time: 01/28/19 10:12 AM  Result Value Ref Range   Vit D, 25-Hydroxy 24 (L) 30 - 100 ng/mL    Comment: Vitamin D Status         25-OH Vitamin D: . Deficiency:                    <20 ng/mL Insufficiency:             20 - 29 ng/mL Optimal:                 > or = 30 ng/mL . For 25-OH Vitamin D testing on patients on  D2-supplementation and patients for whom quantitation  of D2 and D3 fractions is required, the QuestAssureD(TM) 25-OH VIT D, (D2,D3), LC/MS/MS is recommended: order  code [phone] (patients >80yrs). See Note 1 . Note 1 . For additional information, please refer to  http://education.Quest[facility].com/faq/FAQ199  (This link is being provided for informational/ educational purposes only.)   Vitamin B12     Status: Abnormal   Collection Time: 01/28/19 10:12 AM  Result Value Ref Range   Vitamin B-12 1,134 (H) 200 - 1,100 pg/mL  Vitamin B1     Status: None   Collection Time: 01/28/19 10:12 AM  Result Value Ref Range   Vitamin B1 (Thiamine) 16 8 - 30 nmol/L    Comment: Marland Kitchen Vitamin supplementation within 24 hours prior to blood draw may affect the accuracy of the results. . This test was developed and its analytical performance characteristics have been determined by [place], [place]. It has not been cleared or approved by the U.S. Food and Drug Administration. This assay has been validated pursuant to the CLIA regulations and is used for clinical purposes. .   COMPLETE METABOLIC PANEL WITH GFR     Status: Abnormal   Collection Time: 01/28/19 10:12 AM  Result Value Ref Range   Glucose, Bld 98 65 -  99 mg/dL    Comment: .            Fasting reference interval .    BUN 13 7 - 25 mg/dL   Creat 0.88 0.50 - 1.10 mg/dL   GFR, Est Non African American 88 > OR = 60 mL/min/1.81m2   GFR, Est African American 102 > OR = 60 mL/min/1.8m2   BUN/Creatinine Ratio NOT APPLICABLE 6 - 22 (calc)   Sodium 143 135 - 146 mmol/L   Potassium 4.2 3.5 - 5.3 mmol/L   Chloride 109 98 - 110 mmol/L   CO2 25 20 - 32 mmol/L   Calcium 9.3 8.6 - 10.2 mg/dL   Total Protein 5.6 (L) 6.1 - 8.1 g/dL   Albumin 3.7 3.6 - 5.1 g/dL   Globulin 1.9 1.9 - 3.7 g/dL (calc)   AG Ratio 1.9 1.0 - 2.5 (calc)   Total Bilirubin 0.6 0.2 - 1.2 mg/dL   Alkaline phosphatase (APISO) 56 31 - 125 U/L   AST 37 (H) 10 - 30 U/L   ALT 63 (H) 6 - 29 U/L  Lipase, blood     Status: None   Collection Time: 02/09/19 11:38 AM  Result Value Ref Range   Lipase 35 11 - 51 U/L    Comment: Performed at [facility], [address] [place], [place] [address]  Comprehensive metabolic panel     Status: Abnormal   Collection Time: 02/09/19 11:38 AM  Result Value Ref Range   Sodium 140 135 - 145 mmol/L   Potassium 3.7 3.5 - 5.1 mmol/L   Chloride 105 98 - 111 mmol/L   CO2 23 22 - 32 mmol/L   Glucose, Bld 142 (H) 70 - 99 mg/dL   BUN 9 6 - 20 mg/dL   Creatinine, Ser 0.78 0.44 - 1.00 mg/dL   Calcium 9.3 8.9 - 10.3 mg/dL   Total Protein 7.0 6.5 - 8.1 g/dL   Albumin 4.2 3.5 - 5.0 g/dL   AST 52 (H) 15 - 41 U/L   ALT 72 (H) 0 - 44 U/L   Alkaline Phosphatase 72 38 - 126 U/L   Total Bilirubin 0.9 0.3 - 1.2 mg/dL   GFR calc non Af Amer >60 >60 mL/min   GFR calc Af Amer >60 >60 mL/min   Anion gap 12 5 - 15    Comment: Performed at [facility], [address] [place], [place] [address]  CBC     Status: Abnormal   Collection Time: 02/09/19 11:38 AM  Result Value Ref Range   WBC 10.0 4.0 - 10.5 K/uL   RBC 4.88 3.87 - 5.11 MIL/uL   Hemoglobin 14.1 12.0 - 15.0 g/dL   HCT 44.6 36.0 - 46.0 %   MCV 91.4 80.0 - 100.0 fL   MCH 28.9  26.0 - 34.0 pg   MCHC 31.6 30.0 - 36.0 g/dL   RDW 16.3 (H) 11.5 - 15.5 %   Platelets 315 150 - 400 K/uL   nRBC 0.0 0.0 - 0.2 %    Comment: Performed at [facility], [address] [place], [place] [address]  Urinalysis, Complete w Microscopic     Status: Abnormal   Collection Time: 02/09/19 11:38 AM  Result Value Ref Range   Color, Urine AMBER (A) YELLOW    Comment: BIOCHEMICALS MAY BE AFFECTED BY COLOR   APPearance CLOUDY (A) CLEAR   Specific Gravity, Urine 1.019 1.005 - 1.030   pH 5.0 5.0 - 8.0   Glucose, UA NEGATIVE NEGATIVE mg/dL   Hgb urine dipstick LARGE (A) NEGATIVE   Bilirubin Urine NEGATIVE NEGATIVE   Ketones, ur 5 (A) NEGATIVE mg/dL   Protein, ur 100 (A) NEGATIVE mg/dL   Nitrite NEGATIVE NEGATIVE   Leukocytes,Ua TRACE (A) NEGATIVE   RBC / HPF >50 (H) 0 - 5 RBC/hpf   WBC, UA 21-50 0 - 5 WBC/hpf   Bacteria, UA RARE (A) NONE SEEN   Squamous Epithelial / LPF 11-20 0 - 5   Mucus PRESENT     Comment: Performed at [facility], [address] [place],  [address]  Pregnancy, urine POC     Status: None   Collection Time: 02/09/19 12:19 PM  Result Value Ref Range   Preg Test, Ur NEGATIVE NEGATIVE    Comment:        THE SENSITIVITY OF THIS METHODOLOGY IS >24 mIU/mL   Hemoglobin A1c     Status: None   Collection Time: 02/19/19  2:00 PM  Result Value Ref Range   Hgb A1c MFr Bld 4.9 4.8 - 5.6 %  Comment:          Prediabetes: 5.7 - 6.4          Diabetes: >6.4          Glycemic control for adults with diabetes: <7.0    Est. average glucose Bld gHb Est-mCnc 94 mg/dL  Prothrombin gene mutation     Status: None   Collection Time: 02/19/19  2:00 PM  Result Value Ref Range   Factor II, DNA Analysis Comment     Comment: NEGATIVE No mutation identified. Comment: A point mutation ([id number]) in the factor II (prothrombin) gene is the second most common cause of inherited thrombophilia. The incidence of this mutation in the [place] Caucasian  population is about 2% and in the [place] American population it is approximately 0.5%. This mutation is rare in the [place] and Native American population. Being heterozygous for a prothrombin mutation increases the risk for developing venous thrombosis about 2 to 3 times above the general population risk. Being homozygous for the prothrombin gene mutation increases the relative risk for venous thrombosis further, although it is not yet known how much further the risk is increased. In women heterozygous for the prothrombin gene mutation, the use of estrogen containing oral contraceptives increases the relative risk of venous thrombosis about 16 times and the risk of developing cerebral thrombosis is also significantly increased. In pregnancy the prothrombin  gene mutation increases risk for venous thrombosis and may increase risk for stillbirth, placental abruption, pre-eclampsia and fetal growth restriction. If the patient possesses two or more congenital or acquired thrombophilic risk factors, the risk for thrombosis may rise to more than the sum of the risk ratios for the individual mutations. This assay detects only the prothrombin G20210A mutation and does not measure genetic abnormalities elsewhere in the genome. Other thrombotic risk factors may be pursued through systematic clinical laboratory analysis. These factors include the R506Q (Leiden) mutation in the Factor V gene, plasma homocysteine levels, as well as testing for deficiencies of antithrombin III, protein C and protein S. Genetic Counselors are available for health care providers to discuss results at [phone]). Methodology: DNA analysis of the Factor II gene was performed by PCR amplification followed by restriction analysis. The diagnostic  sensitivity is >99% for both. All the tests must be combined with clinical information for the most accurate interpretation. Molecular-based testing is highly  accurate, but as in any laboratory test, diagnostic errors may occur. This test was developed and its performance characteristics determined by [facility]. It has not been cleared or approved by the Food and Drug Administration. Poort SR, et al. Blood. [id number]. Varga EA. Circulation. 2004; [id number]. Mervin Hack, et [place]; 19:700-703. Allison Quarry, PhD, [facility] Ruben Reason, PhD, [facility] Annetta Maw, M.S., PhD, [facility] Alfredo Bach, PhD, [facility] Norva Riffle, PhD, [facility] Earlean Polka, PhD, [facility]   Factor 5 leiden     Status: None   Collection Time: 02/19/19  2:00 PM  Result Value Ref Range   Factor V Leiden Comment     Comment: Result:  Negative (no mutation found) Factor V Leiden is a specific mutation (R506Q) in the factor V gene that is associated with an increased risk of venous thrombosis. Factor V Leiden is more resistant to inactivation by activated protein C.  As a result, factor V persists in the circulation leading to a mild hyper- coagulable state.  The Leiden mutation accounts for 90% - 95% of APC resistance.  Factor V  Leiden has been reported in patients with deep vein thrombosis, pulmonary embolus, central retinal vein occlusion, cerebral sinus thrombosis and hepatic vein thrombosis. Other risk factors to be considered in the workup for venous thrombosis include the G20210A mutation in the factor II (prothrombin) gene, protein S and C deficiency, and antithrombin deficiencies. Anticardiolipin antibody and lupus anticoagulant analysis may be appropriate for certain patients, as well as homocysteine levels. Contact your local [facility] for information on how to order additional tes ting if desired. **Genetic counselors are available for health care providers to**   discuss results at [phone]). Methodology: DNA analysis of the Factor V gene was performed by allele-specific PCR. The diagnostic  sensitivity and specificity is >99% for both. Molecular-based testing is highly accurate, but as in any laboratory test, diagnostic errors may occur. All test results must be combined with clinical information for the most accurate interpretation. This test was developed and its performance characteristics determined by [facility]. It has not been cleared or approved by the Food and Drug Administration. References: Voelkerding K (1996).  Clin Lab Med [phone]. Allison Quarry, PhD, [facility] Ruben Reason, PhD, [facility] Annetta Maw, M.S., PhD, [facility] Alfredo Bach, PhD, [facility] Norva Riffle, PhD, [facility] Earlean Polka PhD, [facility]   Homocysteine     Status: None   Collection Time: 02/19/19  2:00 PM  Result Value Ref Range   Homocysteine 14.4 0.0 - 14.5 umol/L    Comment:               **Please note reference interval change**  Beta-2-glycoprotein i abs, IgG/M/A     Status: None   Collection Time: 02/19/19  2:00 PM  Result Value Ref Range   Beta-2 Glyco I IgG <9 0 - 20 GPI IgG units    Comment: The reference interval reflects a 3SD or 99th percentile interval, which is thought to represent a potentially clinically significant result in accordance with the International Consensus Statement on the classification criteria for definitive antiphospholipid syndrome (APS). J Thromb Haem 2006;4:295-306.    Beta-2 Glyco 1 IgA <9 0 - 25 GPI IgA units    Comment: The reference interval reflects a 3SD or 99th percentile interval, which is thought to represent a potentially clinically significant result in accordance with the International Consensus Statement on the classification criteria for definitive antiphospholipid syndrome (APS). J Thromb Haem 2006;4:295-306.    Beta-2 Glyco 1 IgM <9 0 - 32 GPI IgM units    Comment: The reference interval reflects a 3SD or 99th percentile interval, which is thought to represent a potentially clinically significant result in accordance with the  International Consensus Statement on the classification criteria for definitive antiphospholipid syndrome (APS). J Thromb Haem 2006;4:295-306.   Lupus anticoagulant     Status: None   Collection Time: 02/19/19  2:00 PM  Result Value Ref Range   dPT 31.3 0.0 - 55.0 sec   dPT Confirm Ratio 0.82 0.00 - 1.40 Ratio   Thrombin Time 15.4 0.0 - 23.0 sec   PTT Lupus Anticoagulant 32.8 0.0 - 51.9 sec   Dilute Viper Venom Time 28.4 0.0 - 47.0 sec   Lupus Anticoag Interp Comment:     Comment: No lupus anticoagulant was detected.  Protein S activity     Status: None   Collection Time: 02/19/19  2:00 PM  Result Value Ref Range   Protein S Activity 104 63 - 140 %    Comment: Protein S activity may be falsely increased (masking an abnormal, low  result) in patients receiving direct Xa inhibitor (e.g., rivaroxaban, apixaban, edoxaban) or a direct thrombin inhibitor (e.g., dabigatran) anticoagulant treatment due to assay interference by these drugs.   Protein S, total     Status: None   Collection Time: 02/19/19  2:00 PM  Result Value Ref Range   Protein S Ag, Total 103 60 - 150 %    Comment: This test was developed and its performance characteristics determined by [facility]. It has not been cleared or approved by the Food and Drug Administration.   Protein C activity     Status: None   Collection Time: 02/19/19  2:00 PM  Result Value Ref Range   Protein C Activity 135 73 - 180 %  Protein C, total     Status: None   Collection Time: 02/19/19  2:00 PM  Result Value Ref Range   Protein C Antigen 104 60 - 150 %  Antithrombin III     Status: None   Collection Time: 02/19/19  2:00 PM  Result Value Ref Range   AntiThromb III Func 105 75 - 135 %    Comment: Direct Xa inhibitor anticoagulants such as rivaroxaban, apixaban and edoxaban will lead to spuriously elevated antithrombin activity levels possibly masking a deficiency.   CBC     Status: None   Collection Time: 02/19/19  2:00 PM  Result  Value Ref Range   WBC 7.0 3.4 - 10.8 x10E3/uL   RBC 4.65 3.77 - 5.28 x10E6/uL   Hemoglobin 13.4 11.1 - 15.9 g/dL   Hematocrit 41.7 34.0 - 46.6 %   MCV 90 79 - 97 fL   MCH 28.8 26.6 - 33.0 pg   MCHC 32.1 31.5 - 35.7 g/dL   RDW 15.3 11.7 - 15.4 %   Platelets 380 150 - 450 x10E3/uL  Comprehensive metabolic panel     Status: Abnormal   Collection Time: 02/19/19  2:00 PM  Result Value Ref Range   Glucose 86 65 - 99 mg/dL   BUN 9 6 - 20 mg/dL   Creatinine, Ser 0.78 0.57 - 1.00 mg/dL   GFR calc non Af Amer 102 >59 mL/min/1.73   GFR calc Af Amer 117 >59 mL/min/1.73   BUN/Creatinine Ratio 12 9 - 23   Sodium 142 134 - 144 mmol/L   Potassium 4.3 3.5 - 5.2 mmol/L   Chloride 104 96 - 106 mmol/L   CO2 23 20 - 29 mmol/L   Calcium 9.6 8.7 - 10.2 mg/dL   Total Protein 6.7 6.0 - 8.5 g/dL   Albumin 4.3 3.8 - 4.8 g/dL   Globulin, Total 2.4 1.5 - 4.5 g/dL   Albumin/Globulin Ratio 1.8 1.2 - 2.2   Bilirubin Total 0.4 0.0 - 1.2 mg/dL   Alkaline Phosphatase 75 39 - 117 IU/L   AST 30 0 - 40 IU/L   ALT 36 (H) 0 - 32 IU/L  Vitamin B1     Status: None   Collection Time: 02/19/19  2:00 PM  Result Value Ref Range   Thiamine 126.4 66.5 - 200.0 nmol/L    -------------------------------------------------------------------------- A&P:  Problem List Items Addressed This Visit    History of cerebrovascular accident (CVA) with residual deficit - Primary    Gradually improving residual CVA symptoms with double vision, balance/coordination predominantly, has seen Ophthalmology, concerns for young patient age 33 with CVA, she is pursuing further work up per neurology and modifying risk factors - such as hypercoag eval, TEE or other embolic etiology - Continue current neurology follow-up  and plan  #Bariatric surgery, obesity Continue with improved nutrition, vitamin supplements and follow up plan now seems to have stabilized, with controlled nausea vomiting resolved now   No orders of the defined types were  placed in this encounter.   Follow-up: - Return within 3 months as needed  Patient verbalizes understanding with the above medical recommendations including the limitation of remote medical advice.  Specific follow-up and call-back criteria were given for patient to follow-up or seek medical care more urgently if needed.   - Time spent in direct consultation with patient on phone: 8 minutes   Nobie Putnam, [facility] 03/10/2019, 9:49 AM

## 2019-03-10 NOTE — Patient Instructions (Addendum)
Keep up with PT OT and Neurology as planned.  I will follow along. We submitted all Short Term disability updates in September already, Dr Jaynee Eagles will complete the rest.  Please schedule a Follow-up Appointment to: Return in about 3 months (around 06/09/2019), or if symptoms worsen or fail to improve.  If you have any other questions or concerns, please feel free to call the office or send a message through [place]. You may also schedule an earlier appointment if necessary.  Additionally, you may be receiving a survey about your experience at our office within a few days to 1 week by e-mail or mail. We value your feedback.  Nobie Putnam, DO [place]

## 2019-03-11 ENCOUNTER — Encounter: Payer: Self-pay | Admitting: Occupational Therapy

## 2019-03-11 ENCOUNTER — Ambulatory Visit: Payer: BC Managed Care – PPO | Admitting: Occupational Therapy

## 2019-03-11 ENCOUNTER — Ambulatory Visit: Payer: BC Managed Care – PPO

## 2019-03-11 DIAGNOSIS — R278 Other lack of coordination: Secondary | ICD-10-CM | POA: Diagnosis not present

## 2019-03-11 DIAGNOSIS — R2681 Unsteadiness on feet: Secondary | ICD-10-CM | POA: Diagnosis not present

## 2019-03-11 DIAGNOSIS — I69351 Hemiplegia and hemiparesis following cerebral infarction affecting right dominant side: Secondary | ICD-10-CM

## 2019-03-11 DIAGNOSIS — Z8669 Personal history of other diseases of the nervous system and sense organs: Secondary | ICD-10-CM | POA: Diagnosis not present

## 2019-03-11 DIAGNOSIS — M6281 Muscle weakness (generalized): Secondary | ICD-10-CM

## 2019-03-11 DIAGNOSIS — H543 Unqualified visual loss, both eyes: Secondary | ICD-10-CM | POA: Diagnosis not present

## 2019-03-11 NOTE — Therapy (Signed)
[place] MAIN [facility] SERVICES [address] [place], [place], [address] Phone: [phone]   Fax:  [phone]  Physical Therapy Evaluation  Patient Details  Name: Leah Osborne MRN: [id number] Date of Birth: 11-30-1985 Referring Provider (PT): Sarina Ill, MD    Encounter Date: 03/11/2019  PT End of Session - 03/11/19 1306    Visit Number  1    Number of Visits  8    Date for PT Re-Evaluation  05/11/19    PT Start Time  1108    PT Stop Time  1150    PT Time Calculation (min)  42 min    Activity Tolerance  Patient tolerated treatment well;No increased pain    Behavior During Therapy  WFL for tasks assessed/performed       Past Medical History:  Diagnosis Date  . Abnormal Pap smear of cervix   . Arthritis   . Arthritis, rheumatoid ([place])   . Atypical chest pain   . Bipolar 1 disorder ([place])   . Headache    MIGRAINES  . Hyperlipidemia   . Hypertension   . Morbid obesity ([place])    a. 11/2018 s/p Robotic Sleeve Gastrectomy @ [facility].  Marland Kitchen Palpitations    a. 09/2017 Zio Monitor: predominantly RSR, avg HR 87, 11 A tach/SVT episodes, fastest 176 x 7 beats, longest 9 beats. Rare PAC's, occas PVC's, rare couplets/triplets.  . Persistent Nausea and vomiting    a. Following sleeve gastrectomy 11/2018  . PTSD (post-traumatic stress disorder)   . PVC (premature ventricular contraction)   . Social phobia   . Stroke [facility])     Past Surgical History:  Procedure Laterality Date  . CHOLECYSTECTOMY    . LAPAROSCOPIC GASTRIC SLEEVE RESECTION  11/19/2018  . LAPAROSCOPIC TUBAL LIGATION Bilateral 09/19/2015   Procedure: LAPAROSCOPIC BILATERAL TUBAL BANDING;  Surgeon: Brayton Mars, MD;  Location: [facility] ORS;  Service: Gynecology;  Laterality: Bilateral;    There were no vitals filed for this visit.   Subjective Assessment - 03/11/19 1113    Subjective  Pt presenting to OPPT (neuro) to address residual weakness from a CVA in July 2020 with  continued Rt weakness. Also related to CVA inititally was diplopia (now resolved), dizziness (now resolved), and some occasional LOB.    Pertinent History  Pt sustained a CVA in July 2020 c inititally Rt sided weakness (arm and leg), diplopia (now resolved), dizziness (now resolved), and some occasional LOB. Hx of ankylosing spondylosis, gastric sleeve surgery in June, tubal ligation, and cholecystectomy.    How long can you stand comfortably?  Limited more by Hx of ankylosing spondylosis.    How long can you walk comfortably?  Not limited at this time.    Currently in Pain?  No/denies         [facility] PT Assessment - 03/11/19 0001      Assessment   Medical Diagnosis  CVA c Rt hemiplegia    Referring Provider (PT)  Sarina Ill, MD     Onset Date/Surgical Date  01/10/19    Hand Dominance  Right    Prior Therapy  OT/PT is acute care      Precautions   Precautions  None      Restrictions   Weight Bearing Restrictions  No      Balance Screen   Has the patient fallen in the past 6 months  No    Has the patient had a decrease in activity level because of a fear  of falling?   No    Is the patient reluctant to leave their home because of a fear of falling?   No      Prior Function   Level of Independence  Independent    Vocation  On disability       Home setup -Lives with husband and kids (85, 7, 3)  -8 steps to enter home, railings bilat but uses a SPC modI -split level home with 6-7 steps   *feels some weakness in knee when climbing stairs -Pt sustained 3 falls since CVA, no dizziness, LOC, seizure, all occured while ascending steps.   Seated MMT:  *all 5/5 unless otherwise indictated -Hip flexion: 4+/5 right -Hip hor abdct, hip hor add -Knee flexion/extension (recurvatum bilat) -Ankle DFlexion/PFlexion)  -hip ER 4/5 bilat; hip IR  Prone MMT:  *all 5/5 unless otherwise indictated prone SLR 4/5 bilat   Sidelying Hip ABDCT:  *all 5/5 unless otherwise indictated 4/5  Right, 4+/5 Left   SLS balance:  Left: 3s, 6s, 6s,  Right: 6s, 3s, 5s *pt has limited control of Right knee flexion, losing recurvatum quickly  30sec Chair Rise 11x; after 6x, Right knee lock is delayed compared to Left by ~0.5-1.0 second  TUG: 10.42 10MWT: 9.29sec      Objective measurements completed on examination: See above findings.              PT Education - 03/11/19 1304    Education Details  Educated on [facility] use in AMB and safe stairs navigation    Person(s) Educated  Patient    Methods  Explanation;Demonstration;Verbal cues    Comprehension  Verbalized understanding;Returned demonstration       PT Short Term Goals - 03/11/19 1323      PT SHORT TERM GOAL #1   Title  After 4 weeks pt will report no additional falls related to stairs performance.    Baseline  3 falls in 6 weeks ascending stairs.    Time  4    Period  Weeks    Target Date  04/10/19        PT Long Term Goals - 03/11/19 1323      PT LONG TERM GOAL #1   Title  After 8 weeks patient will demonstrate 30sec Chair rise >14x without visually apparent abberant Right knee locking patterns compared to Left knee.    Baseline  hands free, 11x, Right knee out of sync with Left knee motor patterns after 5x    Time  8    Period  Weeks    Status  New    Target Date  05/11/19      PT LONG TERM GOAL #2   Title  After 8 weeks pt will demonstrate performance of 16 stairs step-over-step pattern, SPC/rails ad lib without right knee instability.    Time  8    Period  Weeks    Status  New    Target Date  05/11/19      PT LONG TERM GOAL #3   Title  After 8 weeks pt will demonstrate SLS >30sec bilat, without Rt knee instability.    Time  8    Period  Weeks    Status  New    Target Date  05/11/19      PT LONG TERM GOAL #4   Title  After 8 weeks pt will demonstrate 10MWT >1.36m/s c SPC PRN    Baseline  1.52m/s c SPC    Time  8  Period  Weeks    Status  New    Target Date  05/11/19              Plan - 03/11/19 1311    Clinical Impression Statement  Pt presenting to OPPT for evaluation of weakness and imbalance following 2 months prior. Examination reveals impaired control of Right knee joint in transfers, AMB, single limb loading, and stairs performance. Isolated strength testing does not reveal very much asymetry, however Right knee instability should be intact given chronic recurvatum, hence this may be secondary to knee proprioception deficits and/or weakness in the anterior compartment, and/or hypertonicity in the posterior compartment. Given 3 recent falls as the most pressing issue, author educated patient on safest navigation strategy for stairs management, as well as education on use of SPC in AMB. Pt will continue to benefit from skiulled PT intervention to address impairment in this note, in order to reduce falls risk, improve dynamic right knee stability, and restore safety and indpeendence in her role as a caregiver and homemaker.    Personal Factors and Comorbidities  Fitness;Age;Time since onset of injury/illness/exacerbation    Examination-Activity Limitations  Locomotion Level;Transfers;Sit;Lift;Stand;Squat;Other   carrying 3yo child   Examination-Participation Restrictions  Cleaning;Shop;Laundry;Yard Work;Other   parenting   Stability/Clinical Decision Making  Stable/Uncomplicated    Clinical Decision Making  Low    Rehab Potential  Excellent    PT Frequency  1x / week    PT Duration  8 weeks    PT Treatment/Interventions  ADLs/Self Care Home Management;Electrical Stimulation;Moist Heat;Traction;Ultrasound;DME Instruction;Gait training;Stair training;Functional mobility training;Therapeutic activities;Therapeutic exercise;Balance training;Neuromuscular re-education;Patient/family education;Manual techniques;Energy conservation;Joint Manipulations;Taping;Passive range of motion    PT Next Visit Plan  Review some functional tests pertinent to caregiver roles:  carrying a child, doing laundry, etc.    PT Home Exercise Plan  None established    Consulted and Agree with Plan of Care  Patient       Patient will benefit from skilled therapeutic intervention in order to improve the following deficits and impairments:  Abnormal gait, Decreased coordination, Decreased activity tolerance, Decreased balance, Decreased mobility, Decreased strength, Hypermobility, Difficulty walking  Visit Diagnosis: Hemiplegia and hemiparesis following cerebral infarction affecting right dominant side ([place]) - Plan: PT plan of care cert/re-cert  Unsteadiness on feet - Plan: PT plan of care cert/re-cert     Problem List Patient Active Problem List   Diagnosis Date Noted  . History of cerebrovascular accident (CVA) with residual deficit 01/27/2019  . 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 Intractable nausea and vomiting 01/10/2019  . 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 Hypokalemia 01/01/2019  . Elevated hemoglobin A1c 02/12/2018  . Dyslipidemia 02/05/2018  . Abnormal skin growth 11/15/2016  . PTSD (post-traumatic stress disorder) 10/24/2016  . Severe bipolar I disorder, most recent episode depressed without psychotic features ([place]) 10/23/2016  . Ankylosing spondylitis ([place]) 10/23/2016  . Sacroiliac inflammation ([place]) 08/02/2016  . Hypertension 05/28/2016  . Vitamin D deficiency 05/02/2016  . 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 Hypothyroidism 02/13/2016  . Anxiety and depression 01/23/2016  . Status post tubal ligation 09/22/2015  . Morbid (severe) obesity due to excess calories ([place]) 04/25/2015  . Anemia, iron deficiency 01/04/2015  . Headache, migraine 01/04/2015  . Abnormal cytological findings in female genital organs 07/04/2011    1:33 PM, 03/11/19 Etta Grandchild, PT, DPT Physical Therapist - [facility]   Outpatient  Physical [place] [phone]     Etta Grandchild 03/11/2019, 1:32 PM  [place] MAIN [facility] SERVICES [address] [place], [place], [address] Phone: [phone]   Fax:  [phone]  Name: Leah Osborne MRN: [id number] Date of Birth: Feb 26, 1986

## 2019-03-11 NOTE — Therapy (Signed)
[place] MAIN [facility] SERVICES [address] [place], [place], [address] Phone: [phone]   Fax:  [phone]  Occupational Therapy Treatment  Patient Details  Name: Leah Osborne MRN: [id number] Date of Birth: [birth year] Referring Provider (OT): Dr. Jaynee Eagles   Encounter Date: 03/11/2019  OT End of Session - 03/11/19 1536    Visit Number  7    Number of Visits  12    Date for OT Re-Evaluation  05/19/19    Authorization Time Period  Progress Report period starting 02/26/19    OT Start Time  1105    OT Stop Time  1145    OT Time Calculation (min)  40 min    Activity Tolerance  Patient tolerated treatment well    Behavior During Therapy  [facility] for tasks assessed/performed       Past Medical History:  Diagnosis Date  . Abnormal Pap smear of cervix   . Arthritis   . Arthritis, rheumatoid ([place])   . Atypical chest pain   . Bipolar 1 disorder ([place])   . Headache    MIGRAINES  . Hyperlipidemia   . Hypertension   . Morbid obesity ([place])    a. 11/2018 s/p Robotic Sleeve Gastrectomy @ [facility].  Marland Kitchen Palpitations    a. 09/2017 Zio Monitor: predominantly RSR, avg HR 87, 11 A tach/SVT episodes, fastest 176 x 7 beats, longest 9 beats. Rare PAC's, occas PVC's, rare couplets/triplets.  . Persistent Nausea and vomiting    a. Following sleeve gastrectomy 11/2018  . PTSD (post-traumatic stress disorder)   . PVC (premature ventricular contraction)   . Social phobia   . Stroke [facility])     Past Surgical History:  Procedure Laterality Date  . CHOLECYSTECTOMY    . LAPAROSCOPIC GASTRIC SLEEVE RESECTION  11/19/2018  . LAPAROSCOPIC TUBAL LIGATION Bilateral 09/19/2015   Procedure: LAPAROSCOPIC BILATERAL TUBAL BANDING;  Surgeon: Brayton Mars, MD;  Location: [facility] ORS;  Service: Gynecology;  Laterality: Bilateral;    There were no vitals filed for this visit.  Subjective Assessment - 03/11/19 1210    Subjective   Pt. reports no pain today    Pertinent  History  Pt. is a 33 y.o. female who was admitted to [facility] with a CVA, Diplopia. Pt. has had Bariatric Laparoscopic Gastric Sleeve Resection.  Pt. was diagnosed with Bilateral Opthalmoplegia. Pt.'s last visit at [facility] was 02/15/2019 with Dr. George Ina. 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    Patient Stated Goals  To improve [place] sills    Currently in Pain?  No/denies      OT TREATMENT    Neuro muscular re-education:  Pt. Worked on left hand [facility] skills manipulating nuts, and bolts on a bolt board. Pt. Worked on screwing, and unscrewing nuts, and bolts of varying sizes, and challenging progressively smaller items. Pt. performed resistive EZ Board exercises for forearm supination/pronation, wrist flexion/extension using gross grasp, and lateral pinch (key) grasp. Pt. performed resistive EZ Board exercises angled in several planes to promote shoulder flexion, abduction, and wrist flexion, and extension while performing resistive wrist flexion and extension with a gross grip.  Response to Treatment  Pt. is making steady progress with right hand [facility] skills, and is now engaging her right hand more during taks at home. Pt. reports that she is now able to hold the pen more steady in her hand when writing, as well as improved writing legibility skills. Pt. continues to work  on improving RUE strength, and [facility] skills in order to improve R UE functional hand use during ADLs, and IADL tasks                      OT Education - 03/11/19 1536    Education Details  UE strength, and [facility] skills    Person(s) Educated  Patient    Methods  Explanation    Comprehension  Verbalized understanding              Plan - 03/11/19 1536    Clinical Impression Statement  Pt. is making steady progress with right hand [facility] skills, and is now engaging her right hand more during taks at home. Pt. reports that she is now able to hold the pen more steady in her hand  when writing, as well as improved writing legibility skills. Pt. continues to work on improving RUE strength, and [facility] skills in order to improve R UE functional hand use during ADLs, and IADL tasks.    OT Occupational Profile and History  Detailed Assessment- Review of Records and additional review of physical, cognitive, psychosocial history related to current functional performance    Occupational performance deficits (Please refer to evaluation for details):  ADL's;IADL's    Body Structure / Function / Physical Skills  ADL;Strength;UE functional use;IADL;ROM;Coordination;FMC;Vision    Rehab Potential  Good    Clinical Decision Making  Several treatment options, min-mod task modification necessary    Modification or Assistance to Complete Evaluation   No modification of tasks or assist necessary to complete eval    OT Frequency  2x / week    OT Duration  12 weeks    OT Treatment/Interventions  Self-care/ADL training;DME and/or AE instruction;Therapeutic activities;Therapeutic exercise;Visual/perceptual remediation/compensation;Patient/family education;Energy conservation    Consulted and Agree with Plan of Care  Patient       Patient will benefit from skilled therapeutic intervention in order to improve the following deficits and impairments:   Body Structure / Function / Physical Skills: ADL, Strength, UE functional use, IADL, ROM, Coordination, FMC, Vision       Visit Diagnosis: Muscle weakness (generalized)  Other lack of coordination    Problem List Patient Active Problem List   Diagnosis Date Noted  . History of cerebrovascular accident (CVA) with residual deficit 01/27/2019  . 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 Intractable nausea and vomiting 01/10/2019  . 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 Hypokalemia 01/01/2019  . Elevated hemoglobin A1c 02/12/2018  . Dyslipidemia 02/05/2018  . Abnormal  skin growth 11/15/2016  . PTSD (post-traumatic stress disorder) 10/24/2016  . Severe bipolar I disorder, most recent episode depressed without psychotic features ([place]) 10/23/2016  . Ankylosing spondylitis ([place]) 10/23/2016  . Sacroiliac inflammation ([place]) 08/02/2016  . Hypertension 05/28/2016  . Vitamin D deficiency 05/02/2016  . 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 Hypothyroidism 02/13/2016  . Anxiety and depression 01/23/2016  . Status post tubal ligation 09/22/2015  . Morbid (severe) obesity due to excess calories ([place]) 04/25/2015  . Anemia, iron deficiency 01/04/2015  . Headache, migraine 01/04/2015  . Abnormal cytological findings in female genital organs 07/04/2011    Harrel Carina, MS, OTR/L 03/11/2019, 3:44 PM  [place] MAIN [facility] SERVICES [address] [place], [place], [address] Phone: [phone]   Fax:  [phone]  Name: Leah Osborne MRN: [id number] Date of Birth: [birth year]

## 2019-03-15 ENCOUNTER — Other Ambulatory Visit: Payer: Self-pay | Admitting: Nurse Practitioner

## 2019-03-15 DIAGNOSIS — E785 Hyperlipidemia, unspecified: Secondary | ICD-10-CM

## 2019-03-15 DIAGNOSIS — I6930 Unspecified sequelae of cerebral infarction: Secondary | ICD-10-CM

## 2019-03-16 ENCOUNTER — Ambulatory Visit: Payer: BC Managed Care – PPO | Admitting: Occupational Therapy

## 2019-03-16 MED ORDER — ASPIRIN 325 MG PO TBEC: 325.0000 mg | DELAYED_RELEASE_TABLET | Freq: Every day | ORAL | 0 refills | Status: DC

## 2019-03-16 MED ORDER — ATORVASTATIN CALCIUM 40 MG PO TABS: 40.0000 mg | ORAL_TABLET | Freq: Every day | ORAL | 0 refills | Status: DC

## 2019-03-17 ENCOUNTER — Telehealth: Payer: Self-pay | Admitting: *Deleted

## 2019-03-17 NOTE — Telephone Encounter (Signed)
[employer] disability forms completed, signed and sent to medical records for processing.   Out of work through 05/11/2019 followed by intermittent leave monthly x 1 year.

## 2019-03-17 NOTE — Telephone Encounter (Signed)
Pt [employer] leave form faxed to [phone].

## 2019-03-18 ENCOUNTER — Ambulatory Visit: Payer: BC Managed Care – PPO | Admitting: Occupational Therapy

## 2019-03-23 ENCOUNTER — Encounter: Payer: Self-pay | Admitting: Occupational Therapy

## 2019-03-23 ENCOUNTER — Encounter: Payer: Self-pay | Admitting: Physical Therapy

## 2019-03-23 ENCOUNTER — Ambulatory Visit: Payer: BC Managed Care – PPO | Admitting: Physical Therapy

## 2019-03-23 ENCOUNTER — Other Ambulatory Visit: Payer: Self-pay

## 2019-03-23 ENCOUNTER — Ambulatory Visit: Payer: BC Managed Care – PPO | Attending: Neurology | Admitting: Occupational Therapy

## 2019-03-23 DIAGNOSIS — R2681 Unsteadiness on feet: Secondary | ICD-10-CM

## 2019-03-23 DIAGNOSIS — M6281 Muscle weakness (generalized): Secondary | ICD-10-CM

## 2019-03-23 DIAGNOSIS — I69351 Hemiplegia and hemiparesis following cerebral infarction affecting right dominant side: Secondary | ICD-10-CM | POA: Insufficient documentation

## 2019-03-23 DIAGNOSIS — R278 Other lack of coordination: Secondary | ICD-10-CM | POA: Insufficient documentation

## 2019-03-23 NOTE — Patient Instructions (Signed)
Access Code: [email or web address]  URL: [email or web address]  Date: 03/23/2019  Prepared by: Blanche East   Exercises  Sit to Stand without Arm Support - 10 reps - 2 sets - 1x daily - 7x weekly  Backwards Walking - 5 reps - 2 sets - 1x daily - 7x weekly  Walking Tandem Stance - 5 reps - 2 sets - 10 sec hold - 1x daily - 7x weekly  Single Leg Stance - 5 reps - 2 sets - 10 sec hold - 1x daily - 7x weekly  Standing Hip Abduction with Resistance at Ankles and Counter Support - 10 reps - 2 sets - 1x daily - 7x weekly  Standing Hip Extension with Resistance at Ankles and Counter Support - 10 reps - 2 sets - 1x daily - 7x weekly  Standing Hip Flexion with Resistance at Ankles and Counter Support - 10 reps - 2 sets - 1x daily - 7x weekly

## 2019-03-23 NOTE — Therapy (Signed)
[place] MAIN [facility] SERVICES [address] [place], [place], [address] Phone: [phone]   Fax:  [phone]  Physical Therapy Treatment  Patient Details  Name: Leah Osborne MRN: [id number] Date of Birth: 02-01-1986 Referring Provider (PT): Sarina Ill, MD    Encounter Date: 03/23/2019  PT End of Session - 03/23/19 1153    Visit Number  2    Number of Visits  8    Date for PT Re-Evaluation  05/11/19    PT Start Time  [id number]    PT Stop Time  1230    PT Time Calculation (min)  45 min    Activity Tolerance  Patient tolerated treatment well;No increased pain    Behavior During Therapy  WFL for tasks assessed/performed       Past Medical History:  Diagnosis Date  . Abnormal Pap smear of cervix   . Arthritis   . Arthritis, rheumatoid ([place])   . Atypical chest pain   . Bipolar 1 disorder ([place])   . Headache    MIGRAINES  . Hyperlipidemia   . Hypertension   . Morbid obesity ([place])    a. 11/2018 s/p Robotic Sleeve Gastrectomy @ [facility].  Marland Kitchen Palpitations    a. 09/2017 Zio Monitor: predominantly RSR, avg HR 87, 11 A tach/SVT episodes, fastest 176 x 7 beats, longest 9 beats. Rare PAC's, occas PVC's, rare couplets/triplets.  . Persistent Nausea and vomiting    a. Following sleeve gastrectomy 11/2018  . PTSD (post-traumatic stress disorder)   . PVC (premature ventricular contraction)   . Social phobia   . Stroke [facility])     Past Surgical History:  Procedure Laterality Date  . CHOLECYSTECTOMY    . LAPAROSCOPIC GASTRIC SLEEVE RESECTION  11/19/2018  . LAPAROSCOPIC TUBAL LIGATION Bilateral 09/19/2015   Procedure: LAPAROSCOPIC BILATERAL TUBAL BANDING;  Surgeon: Brayton Mars, MD;  Location: [facility] ORS;  Service: Gynecology;  Laterality: Bilateral;    There were no vitals filed for this visit.  Subjective Assessment - 03/23/19 1149    Subjective  Patient reports that she is doing well, no reports of new falls since last visit. No pain  today. Has not required SPC during stair negotiation this past week, but uses it with community ambulation.    Pertinent History  Pt sustained a CVA in July 2020 c inititally Rt sided weakness (arm and leg), diplopia (now resolved), dizziness (now resolved), and some occasional LOB. Hx of ankylosing spondylosis, gastric sleeve surgery in June, tubal ligation, and cholecystectomy.    How long can you stand comfortably?  Limited more by Hx of ankylosing spondylosis.    How long can you walk comfortably?  Not limited at this time.    Currently in Pain?  No/denies    Pain Score  0-No pain        Neuro Re-ed: Patient requires CGA during all standing interventions due to limited stability and patient fear of LOB. Cueing for reduction of UE support required as well as postural alignment for optimal stability within COM  Treatment:  Nustep lvl 1 x5 min  STS x10 with no UE support; patient claims no increase in fatigue  NMRE: In parallel bars:  Semi-tandem and tandem stance on floor hold x30 sec  Tandem stance on floor with head turns x10 side-side, each LE forward; cues to slow down head turn to promote improved balance stability.  Tandem stance on airex foam with head turns x10 side-side, each LE forward; 3 rail touches  for support with LLE forward, 10 touches with RLE forward.  Tandem stance on floor with ball raises x10, blue weighted ball, each LE forward; patient demonstrated good balance control with min VC and no UE support.  Tandem trunk rotations x10 with blue weighted ball, each LE forward; to simulate lifting child with car seat handle; 0 rail touches for support with LLE forward, 3 touches with RLE forward.   Standing marches x20; cues to bring knees to chest and slow down movement for increased SLS time; 3 rail touches when on RLE.   Pt educated throughout session about proper posture and technique with exercises. Improved exercise technique, movement at target joints, use of  target muscles after min to mod verbal, visual, tactile cues.   Instructed patient in HEP, see patient instructions;   Response to Treatment: Patient demonstrates good motivation throughout today's session. Patient was able to perform balance exercises on floor and airex foam with good sense of COB, required min VC for sequencing and min-mod UE support on rail with LOB. She continues to be challenged by balance exercises in tandem with RLE forward and SLS time on RLE, which improved with VC and repetition.      PT Education - 03/23/19 1311    Education Details  HEP    Person(s) Educated  Patient    Methods  Explanation;Demonstration;Handout    Comprehension  Verbalized understanding;Need further instruction       PT Short Term Goals - 03/11/19 1323      PT SHORT TERM GOAL #1   Title  After 4 weeks pt will report no additional falls related to stairs performance.    Baseline  3 falls in 6 weeks ascending stairs.    Time  4    Period  Weeks    Target Date  04/10/19        PT Long Term Goals - 03/11/19 1323      PT LONG TERM GOAL #1   Title  After 8 weeks patient will demonstrate 30sec Chair rise >14x without visually apparent abberant Right knee locking patterns compared to Left knee.    Baseline  hands free, 11x, Right knee out of sync with Left knee motor patterns after 5x    Time  8    Period  Weeks    Status  New    Target Date  05/11/19      PT LONG TERM GOAL #2   Title  After 8 weeks pt will demonstrate performance of 16 stairs step-over-step pattern, SPC/rails ad lib without right knee instability.    Time  8    Period  Weeks    Status  New    Target Date  05/11/19      PT LONG TERM GOAL #3   Title  After 8 weeks pt will demonstrate SLS >30sec bilat, without Rt knee instability.    Time  8    Period  Weeks    Status  New    Target Date  05/11/19      PT LONG TERM GOAL #4   Title  After 8 weeks pt will demonstrate 10MWT >1.55m/s c SPC PRN    Baseline   1.80m/s c SPC    Time  8    Period  Weeks    Status  New    Target Date  05/11/19            Plan - 03/24/19 1012    Clinical Impression Statement  Patient  presents with good motivation for today's session. She was able to perform balance exercises with no increase in pain and moderate increase in fatigue, requiring 1 seated rest break. She demonstrated increased difficulty with maintaining tandem stance with right lower extremity forward and maintaining single leg stance on right lower extremity during marches, requiring frequent rail assist. She would continue to benefit from skilled PT to address the deficits outlined in this note.    Personal Factors and Comorbidities  Fitness;Age;Time since onset of injury/illness/exacerbation    Examination-Activity Limitations  Locomotion Level;Transfers;Sit;Lift;Stand;Squat;Other   carrying 3yo child   Examination-Participation Restrictions  Cleaning;Shop;Laundry;Yard Work;Other   parenting   Stability/Clinical Decision Making  Stable/Uncomplicated    Rehab Potential  Excellent    PT Frequency  1x / week    PT Duration  8 weeks    PT Treatment/Interventions  ADLs/Self Care Home Management;Electrical Stimulation;Moist Heat;Traction;Ultrasound;DME Instruction;Gait training;Stair training;Functional mobility training;Therapeutic activities;Therapeutic exercise;Balance training;Neuromuscular re-education;Patient/family education;Manual techniques;Energy conservation;Joint Manipulations;Taping;Passive range of motion    PT Next Visit Plan  Review some functional tests pertinent to caregiver roles: carrying a child, doing laundry, etc.    PT Home Exercise Plan  None established    Consulted and Agree with Plan of Care  Patient       Patient will benefit from skilled therapeutic intervention in order to improve the following deficits and impairments:  Abnormal gait, Decreased coordination, Decreased activity tolerance, Decreased balance, Decreased  mobility, Decreased strength, Hypermobility, Difficulty walking  Visit Diagnosis: Muscle weakness (generalized)  Other lack of coordination  Unsteadiness on feet     Problem List Patient Active Problem List   Diagnosis Date Noted  . History of cerebrovascular accident (CVA) with residual deficit 01/27/2019  . 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 Intractable nausea and vomiting 01/10/2019  . 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 Hypokalemia 01/01/2019  . Elevated hemoglobin A1c 02/12/2018  . Dyslipidemia 02/05/2018  . Abnormal skin growth 11/15/2016  . PTSD (post-traumatic stress disorder) 10/24/2016  . Severe bipolar I disorder, most recent episode depressed without psychotic features ([place]) 10/23/2016  . Ankylosing spondylitis ([place]) 10/23/2016  . Sacroiliac inflammation ([place]) 08/02/2016  . Hypertension 05/28/2016  . Vitamin D deficiency 05/02/2016  . 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 Hypothyroidism 02/13/2016  . Anxiety and depression 01/23/2016  . Status post tubal ligation 09/22/2015  . Morbid (severe) obesity due to excess calories ([place]) 04/25/2015  . Anemia, iron deficiency 01/04/2015  . Headache, migraine 01/04/2015  . Abnormal cytological findings in female genital organs 07/04/2011   Gerda Diss. Rosana Hoes, SPT This entire session was performed under direct supervision and direction of a licensed therapist/therapist assistant . I have personally read, edited and approve of the note as written.  Trotter,Margaret PT, DPT 03/24/2019, 10:14 AM  [place] MAIN [facility] SERVICES [address] [place], [place], [address] Phone: [phone]   Fax:  [phone]  Name: Leah Osborne MRN: [id number] Date of Birth: 10/02/1985

## 2019-03-24 ENCOUNTER — Ambulatory Visit (HOSPITAL_COMMUNITY): Payer: BC Managed Care – PPO

## 2019-03-25 ENCOUNTER — Ambulatory Visit: Payer: BC Managed Care – PPO | Admitting: Occupational Therapy

## 2019-03-25 ENCOUNTER — Ambulatory Visit: Payer: BC Managed Care – PPO | Admitting: Physical Therapy

## 2019-03-25 ENCOUNTER — Other Ambulatory Visit: Payer: Self-pay

## 2019-03-25 ENCOUNTER — Encounter: Payer: Self-pay | Admitting: Occupational Therapy

## 2019-03-25 DIAGNOSIS — R278 Other lack of coordination: Secondary | ICD-10-CM | POA: Diagnosis not present

## 2019-03-25 DIAGNOSIS — I69351 Hemiplegia and hemiparesis following cerebral infarction affecting right dominant side: Secondary | ICD-10-CM | POA: Diagnosis not present

## 2019-03-25 DIAGNOSIS — M6281 Muscle weakness (generalized): Secondary | ICD-10-CM | POA: Diagnosis not present

## 2019-03-25 DIAGNOSIS — R2681 Unsteadiness on feet: Secondary | ICD-10-CM | POA: Diagnosis not present

## 2019-03-25 NOTE — Therapy (Signed)
[place] MAIN [facility] SERVICES [address] [place], [place], [address] Phone: [phone]   Fax:  [phone]  Occupational Therapy Treatment  Patient Details  Name: Leah Osborne MRN: [id number] Date of Birth: 12-09-1985 Referring Provider (OT): Dr. Jaynee Eagles   Encounter Date: 03/23/2019  OT End of Session - 03/24/19 0828    Visit Number  8    Number of Visits  12    Date for OT Re-Evaluation  05/19/19    Authorization Time Period  Progress Report period starting 02/26/19    OT Start Time  1106    OT Stop Time  1145    OT Time Calculation (min)  39 min    Activity Tolerance  Patient tolerated treatment well    Behavior During Therapy  [facility] for tasks assessed/performed       Past Medical History:  Diagnosis Date  . Abnormal Pap smear of cervix   . Arthritis   . Arthritis, rheumatoid ([place])   . Atypical chest pain   . Bipolar 1 disorder ([place])   . Headache    MIGRAINES  . Hyperlipidemia   . Hypertension   . Morbid obesity ([place])    a. 11/2018 s/p Robotic Sleeve Gastrectomy @ [facility].  Marland Kitchen Palpitations    a. 09/2017 Zio Monitor: predominantly RSR, avg HR 87, 11 A tach/SVT episodes, fastest 176 x 7 beats, longest 9 beats. Rare PAC's, occas PVC's, rare couplets/triplets.  . Persistent Nausea and vomiting    a. Following sleeve gastrectomy 11/2018  . PTSD (post-traumatic stress disorder)   . PVC (premature ventricular contraction)   . Social phobia   . Stroke [facility])     Past Surgical History:  Procedure Laterality Date  . CHOLECYSTECTOMY    . LAPAROSCOPIC GASTRIC SLEEVE RESECTION  11/19/2018  . LAPAROSCOPIC TUBAL LIGATION Bilateral 09/19/2015   Procedure: LAPAROSCOPIC BILATERAL TUBAL BANDING;  Surgeon: Brayton Mars, MD;  Location: [facility] ORS;  Service: Gynecology;  Laterality: Bilateral;    There were no vitals filed for this visit.  Subjective Assessment - 03/24/19 0827    Subjective   Patient reports she is doing well, getting  back closer to her baseline level.  She reports handwriting improving.    Pertinent History  Pt. is a 33 y.o. female who was admitted to [facility] with a CVA, Diplopia. Pt. has had Bariatric Laparoscopic Gastric Sleeve Resection.  Pt. was diagnosed with Bilateral Opthalmoplegia. Pt.'s Osborne visit at [facility] was 02/15/2019 with Dr. George Ina. 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    Patient Stated Goals  To improve [facility] skills    Currently in Pain?  No/denies    Pain Score  0-No pain         [facility] OT Assessment - 03/24/19 0826      Coordination   Right 9 Hole Peg Test  24    Left 9 Hole Peg Test  24      Hand Function   Right Hand Grip (lbs)  35    Right Hand Lateral Pinch  13 lbs    Right Hand 3 Point Pinch  14 lbs    Left Hand Grip (lbs)  34    Left Hand Lateral Pinch  14 lbs    Left 3 point pinch  14 lbs         Measurements taken for grip, pinch and coordination as noted above in flowsheet and progress compared to initial evaluation.  Patient seen for handwriting skills for printing name, address, kids names and able to complete with 100% legibility.  Patient does report hand fatigue the more she writes but pleased with the improvement over the Osborne few weeks.   Typing test to establish current level and she was able to demonstrate 30 WPM with one error.  Her normal level is 35 WPM.    Neuromuscular Reeducation:  Patient seen for focus on manipulation skills with right hand to pick up small glass beads from tabletop, some with flat side down and others with curved side down.  Patient requires cues to use translatory skills of the hand to move objects from fingertips to palm, use the hand for storage and then back to finger tips to place items in a container.  Dropped 2 items.  Translatory skills with use of long dowel to work fingers up and down the length of the stick, cues to increase thumb movements to assist with task.    Response to tx:    Patient has made great progress, grip, pinch and coordination in right hand have all improved and are appearing more equal to left hand.  Patient is near baseline level for typing which is required for her job.  Handwriting has improved for letter formation, holding pen with good grip, legibility.  She does still have some hand fatigue with extended writing beyond 2-3 sentences and will work on some writing at home as a part of her home program.  Continue to work towards goals in plan of care to increase safety and independence with necessary daily tasks.                OT Education - 03/24/19 [id number]    Education Details  UE strength, and [facility] skills, home exercise program    Person(s) Educated  Patient    Methods  Explanation;Demonstration    Comprehension  Verbalized understanding;Returned demonstration         OT Long Term Goals - 02/24/19 1436      OT LONG TERM GOAL #1   Title  Pt. will increase Bilateral UE strength to improve ADLs, and IADLs.    Baseline  Eval: BUE strength 4/5 overall    Time  12    Period  Weeks    Status  New    Target Date  05/19/19      OT LONG TERM GOAL #2   Title  Pt. will improve right pinch strength by 2# to be able to turn a key efficiently, and independently    Baseline  Eval: Pt. has difficulty turning a key    Time  12    Period  Weeks    Status  New    Target Date  05/19/19      OT LONG TERM GOAL #3   Title  Pt. will improve right hand [facility] skills by 3 sec. of speed in order to be able pick up, and manipulate small objects during ADLs, and ADLs.    Baseline  Eval: Limited dominant right hand  [facility] skills. Pt. has difficulty manipulating small objects during ADLs, and IADLs.    Time  12    Period  Weeks    Status  New    Target Date  05/19/19      OT LONG TERM GOAL #4   Title  Pt. will independently, and efficiently manipulate small buttons on a shirt with her right hand.    Baseline  Eval: Pt. has difficulty  Time  12    Period   Weeks    Status  New    Target Date  05/19/19      OT LONG TERM GOAL #5   Title  Pt. will write 3-4 senteces with 100% legibility.    Baseline  Eval: Pt. has difficulty    Time  12    Period  Weeks    Status  New    Target Date  05/19/19      Long Term Additional Goals   Additional Long Term Goals  Yes      OT LONG TERM GOAL #6   Title  Pt. will independently demonstrate visual compensatory strategies 100% of the time during ADLS, and IADLs.    Baseline  Eval: Limited bilateral peripheral visual field    Time  12    Period  Weeks    Status  New    Target Date  05/19/19            Plan - 03/24/19 [id number]    Clinical Impression Statement  Patient has made great progress, grip, pinch and coordination in right hand have all improved and are appearing more equal to left hand.  Patient is near baseline level for typing which is required for her job.  Handwriting has improved for letter formation, holding pen with good grip, legibility.  She does still have some hand fatigue with extended writing beyond 2-3 sentences and will work on some writing at home as a part of her home program.  Continue to work towards goals in plan of care to increase safety and independence with necessary daily tasks.    OT Occupational Profile and History  Detailed Assessment- Review of Records and additional review of physical, cognitive, psychosocial history related to current functional performance    Occupational performance deficits (Please refer to evaluation for details):  ADL's;IADL's    Body Structure / Function / Physical Skills  ADL;Strength;UE functional use;IADL;ROM;Coordination;FMC;Vision    Rehab Potential  Good    Clinical Decision Making  Several treatment options, min-mod task modification necessary    Modification or Assistance to Complete Evaluation   No modification of tasks or assist necessary to complete eval    OT Frequency  2x / week    OT Duration  12 weeks    OT  Treatment/Interventions  Self-care/ADL training;DME and/or AE instruction;Therapeutic activities;Therapeutic exercise;Visual/perceptual remediation/compensation;Patient/family education;Energy conservation    Consulted and Agree with Plan of Care  Patient       Patient will benefit from skilled therapeutic intervention in order to improve the following deficits and impairments:   Body Structure / Function / Physical Skills: ADL, Strength, UE functional use, IADL, ROM, Coordination, FMC, Vision       Visit Diagnosis: Muscle weakness (generalized)  Other lack of coordination  Hemiplegia and hemiparesis following cerebral infarction affecting right dominant side [facility])    Problem List Patient Active Problem List   Diagnosis Date Noted  . History of cerebrovascular accident (CVA) with residual deficit 01/27/2019  . 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 Intractable nausea and vomiting 01/10/2019  . 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 Hypokalemia 01/01/2019  . Elevated hemoglobin A1c 02/12/2018  . Dyslipidemia 02/05/2018  . Abnormal skin growth 11/15/2016  . PTSD (post-traumatic stress disorder) 10/24/2016  . Severe bipolar I disorder, most recent episode depressed without psychotic features ([place]) 10/23/2016  . Ankylosing spondylitis ([place]) 10/23/2016  . Sacroiliac inflammation ([place])  08/02/2016  . Hypertension 05/28/2016  . Vitamin D deficiency 05/02/2016  . 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 Hypothyroidism 02/13/2016  . Anxiety and depression 01/23/2016  . Status post tubal ligation 09/22/2015  . Morbid (severe) obesity due to excess calories ([place]) 04/25/2015  . Anemia, iron deficiency 01/04/2015  . Headache, migraine 01/04/2015  . Abnormal cytological findings in female genital organs 07/04/2011   Amy T Tomasita Morrow, OTR/L, CLT  Lovett,Amy 03/25/2019, 8:41 AM  [place] MAIN [facility] SERVICES [address] [place], [place], [address] Phone: [phone]   Fax:  [phone]  Name: Leah Osborne MRN: [id number] Date of Birth: 01-02-1986

## 2019-03-25 NOTE — Therapy (Signed)
[place] MAIN [facility] SERVICES [address] [place], [place], [address] Phone: [phone]   Fax:  [phone]  Occupational Therapy Treatment  Patient Details  Name: Leah Osborne MRN: [id number] Date of Birth: 06/22/1985 Referring Provider (OT): Dr. Jaynee Eagles   Encounter Date: 03/25/2019  OT End of Session - 03/25/19 1154    Visit Number  9    Number of Visits  12    Date for OT Re-Evaluation  05/19/19    Authorization Time Period  Progress Report period starting 02/26/19    OT Start Time  1147    OT Stop Time  1230    OT Time Calculation (min)  43 min    Activity Tolerance  Patient tolerated treatment well    Behavior During Therapy  [facility] for tasks assessed/performed       Past Medical History:  Diagnosis Date  . Abnormal Pap smear of cervix   . Arthritis   . Arthritis, rheumatoid ([place])   . Atypical chest pain   . Bipolar 1 disorder ([place])   . Headache    MIGRAINES  . Hyperlipidemia   . Hypertension   . Morbid obesity ([place])    a. 11/2018 s/p Robotic Sleeve Gastrectomy @ [facility].  Marland Kitchen Palpitations    a. 09/2017 Zio Monitor: predominantly RSR, avg HR 87, 11 A tach/SVT episodes, fastest 176 x 7 beats, longest 9 beats. Rare PAC's, occas PVC's, rare couplets/triplets.  . Persistent Nausea and vomiting    a. Following sleeve gastrectomy 11/2018  . PTSD (post-traumatic stress disorder)   . PVC (premature ventricular contraction)   . Social phobia   . Stroke [facility])     Past Surgical History:  Procedure Laterality Date  . CHOLECYSTECTOMY    . LAPAROSCOPIC GASTRIC SLEEVE RESECTION  11/19/2018  . LAPAROSCOPIC TUBAL LIGATION Bilateral 09/19/2015   Procedure: LAPAROSCOPIC BILATERAL TUBAL BANDING;  Surgeon: Brayton Mars, MD;  Location: [facility] ORS;  Service: Gynecology;  Laterality: Bilateral;    There were no vitals filed for this visit.  Subjective Assessment - 03/25/19 1149    Subjective   Patient reports she is doing well, getting  back closer to her baseline level.  She reports handwriting improving.    Pertinent History  Pt. is a 33 y.o. female who was admitted to [facility] with a CVA, Diplopia. Pt. has had Bariatric Laparoscopic Gastric Sleeve Resection.  Pt. was diagnosed with Bilateral Opthalmoplegia. Pt.'s last visit at [facility] was 02/15/2019 with Dr. George Ina. 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    Patient Stated Goals  To improve [facility] skills    Currently in Pain?  No/denies      OT TREATMENT    Neuro muscular re-education:  Pt. worked on [facility] skills grasping 1" sticks, 1/4" collars, and 1/4" washers. Pt. worked on storing the objects in the palm, and translatory skills moving the items from the palm of the hand to the tip of the 2nd digit, and thumb. Pt. worked on removing the pegs using bilateral alternating hand patterns with increasing speed to remove the sticks.  Selfcare:  Pt. worked on Building services engineer tasks copying sentences, and paragraphs on lined paper, followed by unlined paper. Pt. was able to write in cursive legibly, and efficiently with no deviation below the line. Pt.'s RUE Fatigues with prolonged writing. Pt. Was able to maintain hold on the pen the full duration without shifting it in her hand.  Response to Treatment:  Pt. reports that she is nearing her baseline functioning with her right hand. Pt. reports that her printing is just about the same as befor the stroke. Pt. continues to present with limited right hand [facility] skills. Pt. presents with limited [facility] skills when working on ccordination tasks for increased time. Pt. continues to work on improving UE strength, and [facility] skills in order to improve UE functioning during ADLS,a nd IADLs.                      OT Education - 03/25/19 1153    Education Details  UE strength, and [place] skills    Person(s) Educated  Patient    Methods  Explanation;Demonstration    Comprehension  Verbalized  understanding;Returned demonstration       OT Short Term Goals - 02/24/19 1419      OT SHORT TERM GOAL #1   Title  --    Baseline  --    Time  --    Period  --    Status  --    Target Date  --      OT SHORT TERM GOAL #2   Title  --    Baseline  --    Time  --    Period  --    Status  --    Target Date  --      OT SHORT TERM GOAL #3   Title  --    Baseline  --    Time  --    Period  --    Status  --    Target Date  --      OT SHORT TERM GOAL #4   Title  --    Baseline  --    Time  --    Period  --    Status  --    Target Date  --      OT SHORT TERM GOAL #5   Title  --    Baseline  --    Time  --    Period  --    Status  --    Target Date  --      OT SHORT TERM GOAL #6   Title  --    Baseline  --    Time  --    Period  --    Status  --    Target Date  --        OT Long Term Goals - 02/24/19 1436      OT LONG TERM GOAL #1   Title  Pt. will increase Bilateral UE strength to improve ADLs, and IADLs.    Baseline  Eval: BUE strength 4/5 overall    Time  12    Period  Weeks    Status  New    Target Date  05/19/19      OT LONG TERM GOAL #2   Title  Pt. will improve right pinch strength by 2# to be able to turn a key efficiently, and independently    Baseline  Eval: Pt. has difficulty turning a key    Time  12    Period  Weeks    Status  New    Target Date  05/19/19      OT LONG TERM GOAL #3   Title  Pt. will improve right hand [facility] skills by 3 sec. of speed in order to be able pick up, and manipulate small objects  during ADLs, and ADLs.    Baseline  Eval: Limited dominant right hand  [facility] skills. Pt. has difficulty manipulating small objects during ADLs, and IADLs.    Time  12    Period  Weeks    Status  New    Target Date  05/19/19      OT LONG TERM GOAL #4   Title  Pt. will independently, and efficiently manipulate small buttons on a shirt with her right hand.    Baseline  Eval: Pt. has difficulty    Time  12    Period  Weeks    Status   New    Target Date  05/19/19      OT LONG TERM GOAL #5   Title  Pt. will write 3-4 senteces with 100% legibility.    Baseline  Eval: Pt. has difficulty    Time  12    Period  Weeks    Status  New    Target Date  05/19/19      Long Term Additional Goals   Additional Long Term Goals  Yes      OT LONG TERM GOAL #6   Title  Pt. will independently demonstrate visual compensatory strategies 100% of the time during ADLS, and IADLs.    Baseline  Eval: Limited bilateral peripheral visual field    Time  12    Period  Weeks    Status  New    Target Date  05/19/19            Plan - 03/25/19 1154    Clinical Impression Statement Pt. reports that she is nearing her baseline functioning with her right hand. Pt. reports that her printing is just about the same as befor the stroke. Pt. continues to present with limited right hand [facility] skills. Pt. presents with limited [facility] skills when working on ccordination tasks for increased time. Pt. continues to work on improving UE strength, and [facility] skills in order to improve UE functioning during ADLS,a nd IADLs.    OT Occupational Profile and History  Detailed Assessment- Review of Records and additional review of physical, cognitive, psychosocial history related to current functional performance    Occupational performance deficits (Please refer to evaluation for details):  ADL's;IADL's    Body Structure / Function / Physical Skills  ADL;Strength;UE functional use;IADL;ROM;Coordination;FMC;Vision    Rehab Potential  Good    Clinical Decision Making  Several treatment options, min-mod task modification necessary    Modification or Assistance to Complete Evaluation   No modification of tasks or assist necessary to complete eval    OT Frequency  2x / week    OT Duration  12 weeks    OT Treatment/Interventions  Self-care/ADL training;DME and/or AE instruction;Therapeutic activities;Therapeutic exercise;Visual/perceptual  remediation/compensation;Patient/family education;Energy conservation    Consulted and Agree with Plan of Care  Patient       Patient will benefit from skilled therapeutic intervention in order to improve the following deficits and impairments:   Body Structure / Function / Physical Skills: ADL, Strength, UE functional use, IADL, ROM, Coordination, FMC, Vision       Visit Diagnosis: Muscle weakness (generalized)  Other lack of coordination    Problem List Patient Active Problem List   Diagnosis Date Noted  . History of cerebrovascular accident (CVA) with residual deficit 01/27/2019  . 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 Intractable nausea and vomiting 01/10/2019  . S/P laparoscopic sleeve gastrectomy  01/01/2019  . Hypokalemia 01/01/2019  . Elevated hemoglobin A1c 02/12/2018  . Dyslipidemia 02/05/2018  . Abnormal skin growth 11/15/2016  . PTSD (post-traumatic stress disorder) 10/24/2016  . Severe bipolar I disorder, most recent episode depressed without psychotic features ([place]) 10/23/2016  . Ankylosing spondylitis ([place]) 10/23/2016  . Sacroiliac inflammation ([place]) 08/02/2016  . Hypertension 05/28/2016  . Vitamin D deficiency 05/02/2016  . 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 Hypothyroidism 02/13/2016  . Anxiety and depression 01/23/2016  . Status post tubal ligation 09/22/2015  . Morbid (severe) obesity due to excess calories ([place]) 04/25/2015  . Anemia, iron deficiency 01/04/2015  . Headache, migraine 01/04/2015  . Abnormal cytological findings in female genital organs 07/04/2011    Harrel Carina, MS, OTR/L 03/25/2019, 12:13 PM  [place] MAIN [facility] SERVICES [address] [place], [place], [address] Phone: [phone]   Fax:  [phone]  Name: Leah Osborne MRN: [id number] Date of Birth: [birth year]

## 2019-03-31 ENCOUNTER — Other Ambulatory Visit: Payer: Self-pay

## 2019-03-31 ENCOUNTER — Ambulatory Visit (HOSPITAL_COMMUNITY)
Admission: RE | Admit: 2019-03-31 | Discharge: 2019-03-31 | Disposition: A | Discharge status: Home / Self Care | Payer: BC Managed Care – PPO | Source: Ambulatory Visit | Attending: Neurology | Admitting: Neurology

## 2019-03-31 DIAGNOSIS — I639 Cerebral infarction, unspecified: Secondary | ICD-10-CM | POA: Diagnosis not present

## 2019-03-31 DIAGNOSIS — R29898 Other symptoms and signs involving the musculoskeletal system: Secondary | ICD-10-CM

## 2019-03-31 DIAGNOSIS — H499 Unspecified paralytic strabismus: Secondary | ICD-10-CM

## 2019-03-31 DIAGNOSIS — H532 Diplopia: Secondary | ICD-10-CM

## 2019-03-31 DIAGNOSIS — D6859 Other primary thrombophilia: Secondary | ICD-10-CM | POA: Diagnosis not present

## 2019-03-31 NOTE — Progress Notes (Signed)
TCD completed. Refer to "CV Proc" under chart review to view preliminary results.  03/31/2019 1:41 PM Maudry Mayhew, MHA, RVT, RDCS, RDMS

## 2019-04-01 ENCOUNTER — Ambulatory Visit: Payer: BC Managed Care – PPO

## 2019-04-01 ENCOUNTER — Ambulatory Visit: Payer: BC Managed Care – PPO | Admitting: Physical Therapy

## 2019-04-01 DIAGNOSIS — M6281 Muscle weakness (generalized): Secondary | ICD-10-CM

## 2019-04-01 DIAGNOSIS — R2681 Unsteadiness on feet: Secondary | ICD-10-CM

## 2019-04-01 DIAGNOSIS — I69351 Hemiplegia and hemiparesis following cerebral infarction affecting right dominant side: Secondary | ICD-10-CM | POA: Diagnosis not present

## 2019-04-01 DIAGNOSIS — R278 Other lack of coordination: Secondary | ICD-10-CM | POA: Diagnosis not present

## 2019-04-01 NOTE — Therapy (Signed)
[place] MAIN [facility] SERVICES [address] [place], [place], [address] Phone: [phone]   Fax:  [phone]  Physical Therapy Treatment  Patient Details  Name: Leah Osborne MRN: [id number] Date of Birth: February 14, 1986 Referring Provider (PT): Sarina Ill, MD    Encounter Date: 04/01/2019  PT End of Session - 04/01/19 1156    Visit Number  3    Number of Visits  8    Date for PT Re-Evaluation  05/11/19    PT Start Time  [id number]    PT Stop Time  1227    PT Time Calculation (min)  40 min    Activity Tolerance  Patient tolerated treatment well;No increased pain    Behavior During Therapy  WFL for tasks assessed/performed       Past Medical History:  Diagnosis Date  . Abnormal Pap smear of cervix   . Arthritis   . Arthritis, rheumatoid ([place])   . Atypical chest pain   . Bipolar 1 disorder ([place])   . Headache    MIGRAINES  . Hyperlipidemia   . Hypertension   . Morbid obesity ([place])    a. 11/2018 s/p Robotic Sleeve Gastrectomy @ [facility].  Marland Kitchen Palpitations    a. 09/2017 Zio Monitor: predominantly RSR, avg HR 87, 11 A tach/SVT episodes, fastest 176 x 7 beats, longest 9 beats. Rare PAC's, occas PVC's, rare couplets/triplets.  . Persistent Nausea and vomiting    a. Following sleeve gastrectomy 11/2018  . PTSD (post-traumatic stress disorder)   . PVC (premature ventricular contraction)   . Social phobia   . Stroke [facility])     Past Surgical History:  Procedure Laterality Date  . CHOLECYSTECTOMY    . LAPAROSCOPIC GASTRIC SLEEVE RESECTION  11/19/2018  . LAPAROSCOPIC TUBAL LIGATION Bilateral 09/19/2015   Procedure: LAPAROSCOPIC BILATERAL TUBAL BANDING;  Surgeon: Brayton Mars, MD;  Location: [facility] ORS;  Service: Gynecology;  Laterality: Bilateral;    There were no vitals filed for this visit.  Subjective Assessment - 04/01/19 1153    Subjective  Pt reports all to be well. No updates since prior session. HEP going well. Pt reports feeling  safe/more confiden with navigating stairs in home.    Currently in Pain?  No/denies       INTERVENTION THIS DATE: -Half kneeling lunge Right calf stretch 2x30sec -Rt SAQ supine, 3x10 c 5lb AW -RT ankle DFlexion yellowTB; 3x15 -Rt SLS 10x5secH, chairback as needed for LOB, ModI level, cued to avoid recurvatum if possible.  -seated LAQ 1x15 Right c 5lbAW -Seated unilateral leg press (narrow stance foot placement) 60lb 3x12 Right, 1x20 (not point of failure this time) left (for subjective comparison); seat hole 6 -lateral side stepping with unilateral 10lb loading to simulate household/caregiver tasks tasks 2x10ft -cable resisted walking four quare stepping on red mat three laps with 15lb resistance in 4 directions about the pelvis     PT Short Term Goals - 03/11/19 1323      PT SHORT TERM GOAL #1   Title  After 4 weeks pt will report no additional falls related to stairs performance.    Baseline  3 falls in 6 weeks ascending stairs.    Time  4    Period  Weeks    Target Date  04/10/19        PT Long Term Goals - 03/11/19 1323      PT LONG TERM GOAL #1   Title  After 8 weeks patient will demonstrate 30sec  Chair rise >14x without visually apparent abberant Right knee locking patterns compared to Left knee.    Baseline  hands free, 11x, Right knee out of sync with Left knee motor patterns after 5x    Time  8    Period  Weeks    Status  New    Target Date  05/11/19      PT LONG TERM GOAL #2   Title  After 8 weeks pt will demonstrate performance of 16 stairs step-over-step pattern, SPC/rails ad lib without right knee instability.    Time  8    Period  Weeks    Status  New    Target Date  05/11/19      PT LONG TERM GOAL #3   Title  After 8 weeks pt will demonstrate SLS >30sec bilat, without Rt knee instability.    Time  8    Period  Weeks    Status  New    Target Date  05/11/19      PT LONG TERM GOAL #4   Title  After 8 weeks pt will demonstrate 10MWT >1.53m/s c SPC PRN     Baseline  1.16m/s c SPC    Time  8    Period  Weeks    Status  New    Target Date  05/11/19            Plan - 04/01/19 1200    Clinical Impression Statement  Pt able to complete entire session as planned with rest breaks provided as needed. Pt maintains high level of focus and motivation. Extensiuve verbal, visual, and tactile cues are provided for most accurate form possible. Author provides minA intermittently for full ROM when needed. Overall pt continues to make steady progress toward treatment goals. SLS performance indicative of more involvement of Right hip stabilizers than previously seen. Leg press performance indicates >50% reduced performance on right leg.      PT Frequency  1x / week    PT Duration  8 weeks    PT Treatment/Interventions  ADLs/Self Care Home Management;Electrical Stimulation;Moist Heat;Traction;Ultrasound;DME Instruction;Gait training;Stair training;Functional mobility training;Therapeutic activities;Therapeutic exercise;Balance training;Neuromuscular re-education;Patient/family education;Manual techniques;Energy conservation;Joint Manipulations;Taping;Passive range of motion    PT Next Visit Plan  Review some functional tests pertinent to caregiver roles: carrying a child, doing laundry, etc.    PT Home Exercise Plan  Unclear if establlished    Consulted and Agree with Plan of Care  Patient       Patient will benefit from skilled therapeutic intervention in order to improve the following deficits and impairments:  Abnormal gait, Decreased coordination, Decreased activity tolerance, Decreased balance, Decreased mobility, Decreased strength, Hypermobility, Difficulty walking  Visit Diagnosis: Muscle weakness (generalized)  Other lack of coordination  Unsteadiness on feet  Hemiplegia and hemiparesis following cerebral infarction affecting right dominant side [facility])     Problem List Patient Active Problem List   Diagnosis Date Noted  . History of  cerebrovascular accident (CVA) with residual deficit 01/27/2019  . 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 Intractable nausea and vomiting 01/10/2019  . 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 Hypokalemia 01/01/2019  . Elevated hemoglobin A1c 02/12/2018  . Dyslipidemia 02/05/2018  . Abnormal skin growth 11/15/2016  . PTSD (post-traumatic stress disorder) 10/24/2016  . Severe bipolar I disorder, most recent episode depressed without psychotic features ([place]) 10/23/2016  . Ankylosing spondylitis ([place]) 10/23/2016  . Sacroiliac inflammation ([place]) 08/02/2016  . Hypertension 05/28/2016  .  Vitamin D deficiency 05/02/2016  . 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 Hypothyroidism 02/13/2016  . Anxiety and depression 01/23/2016  . Status post tubal ligation 09/22/2015  . Morbid (severe) obesity due to excess calories ([place]) 04/25/2015  . Anemia, iron deficiency 01/04/2015  . Headache, migraine 01/04/2015  . Abnormal cytological findings in female genital organs 07/04/2011   12:11 PM, 04/01/19 Etta Grandchild, PT, DPT Physical Therapist - [facility]  Outpatient Physical Therapy- [place] [phone]     Etta Grandchild 04/01/2019, 12:02 PM  [place] MAIN [facility] SERVICES [address] [place], [place], [address] Phone: [phone]   Fax:  [phone]  Name: Leah Osborne MRN: [id number] Date of Birth: 1986-01-01

## 2019-04-07 ENCOUNTER — Other Ambulatory Visit: Payer: Self-pay | Admitting: Neurology

## 2019-04-07 ENCOUNTER — Ambulatory Visit (INDEPENDENT_AMBULATORY_CARE_PROVIDER_SITE_OTHER): Payer: BC Managed Care – PPO | Admitting: Neurology

## 2019-04-07 ENCOUNTER — Encounter: Payer: Self-pay | Admitting: Occupational Therapy

## 2019-04-07 ENCOUNTER — Encounter: Payer: Self-pay | Admitting: Physical Therapy

## 2019-04-07 ENCOUNTER — Ambulatory Visit: Payer: BC Managed Care – PPO | Admitting: Occupational Therapy

## 2019-04-07 ENCOUNTER — Ambulatory Visit: Payer: BC Managed Care – PPO | Admitting: Physical Therapy

## 2019-04-07 ENCOUNTER — Encounter: Payer: Self-pay | Admitting: Neurology

## 2019-04-07 ENCOUNTER — Other Ambulatory Visit: Payer: Self-pay

## 2019-04-07 VITALS — BP 130/86 | HR 66 | Temp 98.5°F | Ht 61.0 in | Wt 268.0 lb

## 2019-04-07 DIAGNOSIS — M6281 Muscle weakness (generalized): Secondary | ICD-10-CM

## 2019-04-07 DIAGNOSIS — I69351 Hemiplegia and hemiparesis following cerebral infarction affecting right dominant side: Secondary | ICD-10-CM | POA: Diagnosis not present

## 2019-04-07 DIAGNOSIS — I6381 Other cerebral infarction due to occlusion or stenosis of small artery: Secondary | ICD-10-CM

## 2019-04-07 DIAGNOSIS — G35 Multiple sclerosis: Secondary | ICD-10-CM

## 2019-04-07 DIAGNOSIS — R2681 Unsteadiness on feet: Secondary | ICD-10-CM | POA: Diagnosis not present

## 2019-04-07 DIAGNOSIS — R9082 White matter disease, unspecified: Secondary | ICD-10-CM

## 2019-04-07 DIAGNOSIS — H532 Diplopia: Secondary | ICD-10-CM

## 2019-04-07 DIAGNOSIS — R278 Other lack of coordination: Secondary | ICD-10-CM

## 2019-04-07 MED ORDER — ASPIRIN EC 81 MG PO TBEC: 81.0000 mg | DELAYED_RELEASE_TABLET | Freq: Every day | ORAL | 2 refills | Status: DC

## 2019-04-07 NOTE — Patient Instructions (Signed)
Increase repetitions with theraband leg kicks to 2 sets of 15 repetitions;  Work on Architect of single leg stance throughout day (when brushing teeth, brushing hair, washing dishes) be sure to increase hip/core activation for better stance control;    Band Walk: Zig Zag   Tie green band around legs,AROUND ANKLES. Walk forward _both__ feet in a zig zag pattern. Without turning walk backward to start for one zig zag. Repeat _2-3__ zig zags per session.   http://plyo.exer.us/80   Copyright  VHI. All rights reserved.

## 2019-04-07 NOTE — Therapy (Signed)
[place] MAIN [facility] SERVICES [address] [place], [place], [address] Phone: [phone]   Fax:  [phone]  Physical Therapy Treatment  Patient Details  Name: Leah Osborne MRN: [id number] Date of Birth: 04/10/1986 Referring Provider (PT): Sarina Ill, MD    Encounter Date: 04/07/2019  PT End of Session - 04/07/19 1113    Visit Number  4    Number of Visits  8    Date for PT Re-Evaluation  05/11/19    PT Start Time  1108    PT Stop Time  1142    PT Time Calculation (min)  34 min    Equipment Utilized During Treatment  Gait belt    Activity Tolerance  Patient tolerated treatment well;No increased pain    Behavior During Therapy  WFL for tasks assessed/performed       Past Medical History:  Diagnosis Date  . Abnormal Pap smear of cervix   . Arthritis   . Arthritis, rheumatoid ([place])   . Atypical chest pain   . Bipolar 1 disorder ([place])   . Headache    MIGRAINES  . Hyperlipidemia   . Hypertension   . Morbid obesity ([place])    a. 11/2018 s/p Robotic Sleeve Gastrectomy @ [facility].  Marland Kitchen Palpitations    a. 09/2017 Zio Monitor: predominantly RSR, avg HR 87, 11 A tach/SVT episodes, fastest 176 x 7 beats, longest 9 beats. Rare PAC's, occas PVC's, rare couplets/triplets.  . Persistent Nausea and vomiting    a. Following sleeve gastrectomy 11/2018  . PTSD (post-traumatic stress disorder)   . PVC (premature ventricular contraction)   . Social phobia   . Stroke [facility])     Past Surgical History:  Procedure Laterality Date  . CHOLECYSTECTOMY    . LAPAROSCOPIC GASTRIC SLEEVE RESECTION  11/19/2018  . LAPAROSCOPIC TUBAL LIGATION Bilateral 09/19/2015   Procedure: LAPAROSCOPIC BILATERAL TUBAL BANDING;  Surgeon: Brayton Mars, MD;  Location: [facility] ORS;  Service: Gynecology;  Laterality: Bilateral;    There were no vitals filed for this visit.  Subjective Assessment - 04/07/19 1112    Subjective  Patient reports doing well; denies any  soreness; She presents to therapy without cane; She reports she hasn't needed it for last week and a half.    Pertinent History  Pt sustained a CVA in July 2020 c inititally Rt sided weakness (arm and leg), diplopia (now resolved), dizziness (now resolved), and some occasional LOB. Hx of ankylosing spondylosis, gastric sleeve surgery in June, tubal ligation, and cholecystectomy.    How long can you stand comfortably?  Limited more by Hx of ankylosing spondylosis.    How long can you walk comfortably?  Not limited at this time.    Currently in Pain?  No/denies    Multiple Pain Sites  No         TREATMENT: Exercise: Warm up on treadmill 1.5 mph with 2 HHA x3 min with cues to improve step length and increase erect posture for better gait mechanics;   Standing with green tband around BLE: Diagonal stepping forward/backward x10 feet x3 laps unsupported CGA to close supervision with miN Vcs for sequencing and positioning; Side stepping x10 feet x3 laps unsupported to challenge hip strength and balance;  Leg press 60# RLE 2x20 - Calf raises 60# RLE 2x20  Requires min VCs for proper positioning and to slow down LE movement for better motor control;   Forward step ups to 4 inch step x10 reps unsupported with CGA  to close supervision for safety; Pt reports increased fatigue but was able to complete well without difficulty;    Balance: SLS: averages 5-7 sec each LE x3 sets; required cues to improve hip/core stabilization for better motor control/balance; Also recommended patient increase practice throughout day with trying this with hygiene/cleaning to increase repetition throughout the day;  Standing on airex foam: -alternate toe taps to 4 inch step with 0 rail assist x15 reps bilaterally; -Standing one foot on airex, one foot on 4 inch step, BUE ball pass side/side x5 reps each foot on step -mini squat unsupported x10 reps with cues for reaching forward for better stance control; Patient  required min VCs for balance stability, including to increase trunk control for less loss of balance with smaller base of support  Response to treatment: Pt tolerated well. She requires cues for proper positioning and exercise technique. Patient did have difficulty with SLS requiring cues for weight shift and to improve core activation for better stability. Advanced HEP with written instruction for better adherence;                    PT Education - 04/07/19 1112    Education Details  HEP reinforced, balance/strength;    Person(s) Educated  Patient    Methods  Explanation;Verbal cues    Comprehension  Verbalized understanding;Returned demonstration;Verbal cues required;Need further instruction       PT Short Term Goals - 03/11/19 1323      PT SHORT TERM GOAL #1   Title  After 4 weeks pt will report no additional falls related to stairs performance.    Baseline  3 falls in 6 weeks ascending stairs.    Time  4    Period  Weeks    Target Date  04/10/19        PT Long Term Goals - 03/11/19 1323      PT LONG TERM GOAL #1   Title  After 8 weeks patient will demonstrate 30sec Chair rise >14x without visually apparent abberant Right knee locking patterns compared to Left knee.    Baseline  hands free, 11x, Right knee out of sync with Left knee motor patterns after 5x    Time  8    Period  Weeks    Status  New    Target Date  05/11/19      PT LONG TERM GOAL #2   Title  After 8 weeks pt will demonstrate performance of 16 stairs step-over-step pattern, SPC/rails ad lib without right knee instability.    Time  8    Period  Weeks    Status  New    Target Date  05/11/19      PT LONG TERM GOAL #3   Title  After 8 weeks pt will demonstrate SLS >30sec bilat, without Rt knee instability.    Time  8    Period  Weeks    Status  New    Target Date  05/11/19      PT LONG TERM GOAL #4   Title  After 8 weeks pt will demonstrate 10MWT >1.53m/s c SPC PRN    Baseline  1.35m/s  c SPC    Time  8    Period  Weeks    Status  New    Target Date  05/11/19            Plan - 04/07/19 1113    Clinical Impression Statement  Patient late to session; Instructed patient in  advanced LE strengthening, advancing HEP. See patient instructions. She requires min Vcs for proper exercise technique and positioning for better motor control and strengthening. Patient instructed in advanced balance exercise. She is able to progress balance with less rail assist. However she is still having difficulty with SLS tasks. She would benefit from additional skilled PT intervention to improve strength, balance and gait safety;    Personal Factors and Comorbidities  Fitness;Age;Time since onset of injury/illness/exacerbation    Examination-Activity Limitations  Locomotion Level;Transfers;Sit;Lift;Stand;Squat;Other   carrying 3yo child   Examination-Participation Restrictions  Cleaning;Shop;Laundry;Yard Work;Other   parenting   Stability/Clinical Decision Making  Stable/Uncomplicated    Rehab Potential  Excellent    PT Frequency  1x / week    PT Duration  8 weeks    PT Treatment/Interventions  ADLs/Self Care Home Management;Electrical Stimulation;Moist Heat;Traction;Ultrasound;DME Instruction;Gait training;Stair training;Functional mobility training;Therapeutic activities;Therapeutic exercise;Balance training;Neuromuscular re-education;Patient/family education;Manual techniques;Energy conservation;Joint Manipulations;Taping;Passive range of motion    PT Next Visit Plan  Review some functional tests pertinent to caregiver roles: carrying a child, doing laundry, etc.    PT Home Exercise Plan  None established    Consulted and Agree with Plan of Care  Patient       Patient will benefit from skilled therapeutic intervention in order to improve the following deficits and impairments:  Abnormal gait, Decreased coordination, Decreased activity tolerance, Decreased balance, Decreased mobility,  Decreased strength, Hypermobility, Difficulty walking  Visit Diagnosis: Muscle weakness (generalized)  Other lack of coordination  Unsteadiness on feet     Problem List Patient Active Problem List   Diagnosis Date Noted  . History of cerebrovascular accident (CVA) with residual deficit 01/27/2019  . 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 Intractable nausea and vomiting 01/10/2019  . 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 Hypokalemia 01/01/2019  . Elevated hemoglobin A1c 02/12/2018  . Dyslipidemia 02/05/2018  . Abnormal skin growth 11/15/2016  . PTSD (post-traumatic stress disorder) 10/24/2016  . Severe bipolar I disorder, most recent episode depressed without psychotic features ([place]) 10/23/2016  . Ankylosing spondylitis ([place]) 10/23/2016  . Sacroiliac inflammation ([place]) 08/02/2016  . Hypertension 05/28/2016  . Vitamin D deficiency 05/02/2016  . 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 Hypothyroidism 02/13/2016  . Anxiety and depression 01/23/2016  . Status post tubal ligation 09/22/2015  . Morbid (severe) obesity due to excess calories ([place]) 04/25/2015  . Anemia, iron deficiency 01/04/2015  . Headache, migraine 01/04/2015  . Abnormal cytological findings in female genital organs 07/04/2011    , PT, DPT 04/07/2019, 12:55 PM  [place] MAIN [facility] SERVICES [address] [place], [place], [address] Phone: [phone]   Fax:  [phone]  Name: ZAIDEN SIMAN MRN: [id number] Date of Birth: 1985/10/16

## 2019-04-07 NOTE — Progress Notes (Signed)
[facility] [address] [place]. [place] [address] [phone]       OFFICE CONSULT NOTE  Leah Osborne Date of Birth:  Aug 10, 1985 Medical Record Number:  [id number]   Referring MD: Sarina Ill  Reason for Referral: Stroke second opinion  HPI: Leah Osborne is a 33 year old pleasant Caucasian lady seen today for initial office visit for second opinion consult upon request from Dr. Jaynee Eagles.  History is obtained from the patient and review of electronic medical records as well as I have personally reviewed imaging films in PACS.  She is a 33 year old Caucasian lady with past medical history of morbid obesity, hypertension, irritable bowel syndrome, mixed bipolar disorder, PTSD, depression, recent robotic gastric sleeve surgery on 11/19/2018 who was discharged from [facility] 1 day prior to admission to [facility]  There she was found to have diplopia and was asked to see ophthalmologist as an outpatient.  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  There is also a left caudate lacunar infarct of remote age.  Contrast was not administered.  Transthoracic echo was unremarkable.  Carotid ultrasound was also normal.  Patient double vision improved and she was started on thiamine since she had recently had gastric bypass surgery and possibility of Wernicke's encephalopathy was raised even though the patient had no mental status changes.  Patient was seen by neuro hospitalist and started on aspirin and statins.  Patient stated she also had some dizziness and had to walk holding onto objects and symptoms gradually worsened over a week.   However after admission to the [facility] she recovered gradually over 2 weeks with outpatient physical and occupational therapy.  She has had no further recurrent neurological symptoms.  She was seen by Dr. Jaynee Eagles who has ordered MRI scan of the brain with contrast as well as cervical spine but they have not yet been scheduled.  Patient's vascular risk factor can consist of morbid obesity even after the gastric sleeve surgery she initially lost 40 pounds but has regained 10 to 15 pounds back.  She also has sleep apnea and has a CPAP machine but does not use it regularly as she does not like the mask.  She also has hyperlipidemia and is on a statin.  Her last hemoglobin A1c was 4.8.  She has no history of deep thrombosis, pulmonary embolism.  She does have family history of stroke in her grandfather at age 75.  However there is no family history of heart attacks or strokes in multiple members at a young age.  She is currently on aspirin 325 mg daily which she seems to be tolerating well.  She is also on Lipitor 40 mg and is tolerating it well without muscle aches and pains.  She has no new complaints today.  She did undergo transcranial Doppler study on 03/31/2019 hich shows slightly elevated middle cerebral artery velocities bilaterally of unclear significance but no major stenosis noted.  She had recent labs on 02/19/2019 which include thiamine level which was normal.  Hemoglobin A1c was 4.9.  Comprehensive metabolic panel lab was normal except minimal elevation in ALT of 36.  CBC  was normal.  Hypercoagulable panel labs were all normal.  Anticardiolipin antibodies still appear pending. ROS:   14 system review of systems is positive for dizziness, imbalance, double vision, sleep apnea, migraine and all other systems negative  PMH:  Past Medical History:  Diagnosis Date   Abnormal Pap smear of cervix    Arthritis    Arthritis, rheumatoid ([facility])    Atypical chest pain    Bipolar 1  disorder ([place])    Headache    MIGRAINES   Hyperlipidemia    Hypertension    Morbid obesity ([place])    a. 11/2018 s/p Robotic Sleeve Gastrectomy @ [facility].   Palpitations    a. 09/2017 Zio Monitor: predominantly RSR, avg HR 87, 11 A tach/SVT episodes, fastest 176 x 7 beats, longest 9 beats. Rare PAC's, occas PVC's, rare couplets/triplets.   Persistent Nausea and vomiting    a. Following sleeve gastrectomy 11/2018   PTSD (post-traumatic stress disorder)    PVC (premature ventricular contraction)    Social phobia    Stroke [facility])     Social History:  Social History   Socioeconomic History   Marital status: Married    Spouse name: Not on file   Number of children: Not on file   Years of education: Not on file   Highest education level: Not on file  Occupational History   Not on file  Social Needs   Financial resource strain: Not on file   Food insecurity    Worry: Not on file    Inability: Not on file   Transportation needs    Medical: Not on file    Non-medical: Not on file  Tobacco Use   Smoking status: Never Smoker   Smokeless tobacco: Never Used  Substance and Sexual Activity   Alcohol use: Not Currently    Alcohol/week: 0.0 standard drinks   Drug use: No   Sexual activity: Yes    Birth control/protection: Surgical    Comment: tubal  Lifestyle   Physical activity    Days per week: Not on file    Minutes per session: Not on file   Stress: Not on file  Relationships   Social connections    Talks on phone: Not on file    Gets together: Not on file    Attends religious service: Not on file    Active member of club or organization: Not on file    Attends meetings of clubs or organizations: Not on file    Relationship status: Not on file   Intimate partner violence    Fear of current or ex partner: Not on file    Emotionally abused: Not on file    Physically abused: Not on file    Forced sexual activity: Not on file  Other Topics Concern    Not on file  Social History Narrative   Lives in [place] with husband   Right handed    Medications:   Current Outpatient Medications on File Prior to Visit  Medication Sig Dispense Refill   atorvastatin (LIPITOR) 40 MG tablet Take 1 tablet (40 mg total) by mouth daily at 6 PM. 30 tablet 0   esomeprazole (NEXIUM) 40 MG capsule Take 40 mg by mouth daily.      levothyroxine (SYNTHROID, LEVOTHROID) 25 MCG tablet Take 1 tablet (25 mcg total) by mouth daily before breakfast. 90 tablet 3   OLANZapine zydis (ZYPREXA) 5 MG disintegrating tablet Take 0.5 tablets (2.5 mg total) by mouth 2 (two) times a  day. 14 tablet 0   thiamine (VITAMIN B-1) 100 MG tablet Start 1 tab (100mg ) 3 times a day for 2 weeks, then reduce to 1 tab (100mg ) daily for maintenance 90 tablet 1   No current facility-administered medications on file prior to visit.     Allergies:  No Known Allergies  Physical Exam General: Obese young Caucasian lady seated, in no evident distress Head: head normocephalic and atraumatic.   Neck: supple with no carotid or supraclavicular bruits Cardiovascular: regular rate and rhythm, no murmurs Musculoskeletal: no deformity Skin:  no rash/petichiae Vascular:  Normal pulses all extremities  Neurologic Exam Mental Status: Awake and fully alert. Oriented to place and time. Recent and remote memory intact. Attention span, concentration and fund of knowledge appropriate. Mood and affect appropriate.  Cranial Nerves: Fundoscopic exam reveals sharp disc margins. Pupils equal, briskly reactive to light. Extraocular movements full without nystagmus. Visual fields full to confrontation. Hearing intact. Facial sensation intact. Face, tongue, palate moves normally and symmetrically.  Motor: Normal bulk and tone. Normal strength in all tested extremity muscles. Sensory.: intact to touch , pinprick , position and vibratory sensation.  Coordination: Rapid alternating movements normal in all  extremities. Finger-to-nose and heel-to-shin performed accurately bilaterally. Gait and Station: Arises from chair without difficulty. Stance is normal. Gait demonstrates normal stride length and balance . Able to heel, toe and tandem walk without difficulty.  Reflexes: 1+ and symmetric. Toes downgoing.   NIHSS  0 Modified Rankin 0   ASSESSMENT: 73 year Caucasian lady with episode of sudden onset dizziness, imbalance and vertical diplopia in July 2020 with abnormal MRI showing corpus callosal as well as left caudate subcortical lesions likely lacunar infarcts from small vessel disease.  Multiple sclerosis is possible given her age but clinical symptomatology does not suggest that.  Vascular risk factors of obesity, sleep apnea, hyperlipidemia and family history     PLAN:I had a long discussion with the patient regarding her episode of dizziness, imbalance and diplopia in July 2020 likely representing small brainstem infarct not visualized on MRI and her MRI does show a corpus callosum and possibly an over the right periventricular lacunar infarct as well.  I recommend she reduce aspirin to 81 mg daily for stroke prevention and maintain aggressive risk factor modification with strict control of hypertension with blood pressure goal below 130/90, LDL cholesterol goal below 70 mg percent and diabetes with hemoglobin A1c goal below 7.0.  She was also advised to eat healthy and to lose weight.  I counseled her to be compliant with using her CPAP every night for her sleep apnea as well.Check transcranial Doppler bubble study for PFO.  Continue MRI scan of the brain with contrast and cervical spine as ordered by Dr. Jaynee Eagles.  Multiple sclerosis is in the differential diagnosis for patient her age but her clinical history is highly suggestive of that.  Greater than 50% time during this 45-minute consultation visit was spent on counseling and coordination of care about her abnormal MRI findings and episode of  lacunar stroke versus MS and answering questions she will continue scheduled follow-up with Dr.Ahern and no schedule appointment with me was made. Antony Contras, MD  [facility] [address] [place], [place] [address]  Phone [phone] Fax [phone]  Note: This document was prepared with digital dictation and possible smart phrase technology. Any transcriptional errors that result from this process are unintentional.

## 2019-04-07 NOTE — Therapy (Signed)
[place] MAIN [facility] SERVICES [address] [place], [place], [address] Phone: [phone]   Fax:  [phone]  Occupational Therapy Treatment/Discharge Note  Patient Details  Name: Leah Osborne MRN: [id number] Date of Birth: Jul 23, 1985 Referring Provider (OT): Dr. Jaynee Eagles   Encounter Date: 04/07/2019  OT End of Session - 04/07/19 1216    Visit Number  10    Number of Visits  12    Date for OT Re-Evaluation  05/19/19    Authorization Time Period  Progress Report period starting 02/26/19    OT Start Time  1145    OT Stop Time  1204    OT Time Calculation (min)  19 min    Activity Tolerance  Patient tolerated treatment well    Behavior During Therapy  [facility] for tasks assessed/performed       Past Medical History:  Diagnosis Date  . Abnormal Pap smear of cervix   . Arthritis   . Arthritis, rheumatoid ([place])   . Atypical chest pain   . Bipolar 1 disorder ([place])   . Headache    MIGRAINES  . Hyperlipidemia   . Hypertension   . Morbid obesity ([place])    a. 11/2018 s/p Robotic Sleeve Gastrectomy @ [facility].  Marland Kitchen Palpitations    a. 09/2017 Zio Monitor: predominantly RSR, avg HR 87, 11 A tach/SVT episodes, fastest 176 x 7 beats, longest 9 beats. Rare PAC's, occas PVC's, rare couplets/triplets.  . Persistent Nausea and vomiting    a. Following sleeve gastrectomy 11/2018  . PTSD (post-traumatic stress disorder)   . PVC (premature ventricular contraction)   . Social phobia   . Stroke [facility])     Past Surgical History:  Procedure Laterality Date  . CHOLECYSTECTOMY    . LAPAROSCOPIC GASTRIC SLEEVE RESECTION  11/19/2018  . LAPAROSCOPIC TUBAL LIGATION Bilateral 09/19/2015   Procedure: LAPAROSCOPIC BILATERAL TUBAL BANDING;  Surgeon: Brayton Mars, MD;  Location: [facility] ORS;  Service: Gynecology;  Laterality: Bilateral;    There were no vitals filed for this visit.  Subjective Assessment - 04/07/19 1148    Subjective   Pt. reports that she got her  haircut becuase her hair was starting to fall out.    Pertinent History  Pt. is a 33 y.o. female who [facility] with a CVA, Diplopia. Pt. has had Bariatric Laparoscopic Gastric Sleeve Resection.  Pt. was diagnosed with Bilateral Opthalmoplegia. Pt.'s last visit at [facility] was 02/15/2019 with Dr. George Ina. 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    Currently in Pain?  No/denies         [facility] OT Assessment - 04/07/19 0001      Coordination   Right 9 Hole Peg Test  23    Left 9 Hole Peg Test  20       Strength   Overall Strength Comments  R: 4+/5 shoulder abduction , 5/5 flexion, elbow flexion, extension, wrist flexion, extension, LUE 5/5      Hand Function   Right Hand Grip (lbs)  45    Right Hand Lateral Pinch  14 lbs    Right Hand 3 Point Pinch  17 lbs    Left Hand Grip (lbs)  40    Left Hand Lateral Pinch  16 lbs    Left 3 point pinch  16 lbs  OT Education - 04/07/19 1216    Education Details  UE strength, and [place] skills    Person(s) Educated  Patient    Methods  Explanation;Demonstration    Comprehension  Verbalized understanding;Returned demonstration       OT Short Term Goals - 02/24/19 1419      OT SHORT TERM GOAL #1   Title  --    Baseline  --    Time  --    Period  --    Status  --    Target Date  --      OT SHORT TERM GOAL #2   Title  --    Baseline  --    Time  --    Period  --    Status  --    Target Date  --      OT SHORT TERM GOAL #3   Title  --    Baseline  --    Time  --    Period  --    Status  --    Target Date  --      OT SHORT TERM GOAL #4   Title  --    Baseline  --    Time  --    Period  --    Status  --    Target Date  --      OT SHORT TERM GOAL #5   Title  --    Baseline  --    Time  --    Period  --    Status  --    Target Date  --      OT SHORT TERM GOAL #6   Title  --    Baseline  --    Time  --    Period  --    Status  --     Target Date  --        OT Long Term Goals - 04/07/19 1159      OT LONG TERM GOAL #1   Title  Pt. will increase Bilateral UE strength to improve ADLs, and IADLs.    Baseline  Eval: BUE strength 4/5 overall    Time  12    Period  Weeks    Status  Achieved      OT LONG TERM GOAL #2   Title  Pt. will improve right pinch strength by 2# to be able to turn a key efficiently, and independently    Baseline  Eval: Pt. has difficulty turning a key    Time  12    Period  Weeks    Status  Achieved      OT LONG TERM GOAL #3   Title  Pt. will improve right hand [facility] skills by 3 sec. of speed in order to be able pick up, and manipulate small objects during ADLs, and ADLs.    Baseline  Eval: Pt. is now able to manipulate small objects during ADLs, and IADLs.    Time  12    Period  Weeks    Status  Achieved      OT LONG TERM GOAL #4   Title  Pt. will independently, and efficiently manipulate small buttons on a shirt with her right hand.    Baseline  Independent    Time  12    Period  Weeks    Status  Achieved      OT LONG TERM GOAL #5   Title  Pt. will write 3-4 senteces with 100% legibility.    Baseline  Pt. is back at baseline with writing in print, and cursive form    Time  12    Period  Weeks    Status  Achieved      OT LONG TERM GOAL #6   Title  Pt. will independently demonstrate visual compensatory strategies 100% of the time during ADLS, and IADLs.    Baseline  Eval: Uses compensatory strategies as needed    Time  12    Period  Weeks    Status  Achieved            Plan - 04/07/19 1215    Clinical Impression Statement  Measurements were obtained, and goals were reviewed. Pt. has made excellent progress with objective measuremnts, and has met all goals. Pt. is now ready for discharge from skilled OT services at this time. Pt. is in agreement. No further OT POC or goals are warranted at this time.    OT Occupational Profile and History  Detailed Assessment- Review of  Records and additional review of physical, cognitive, psychosocial history related to current functional performance    Occupational performance deficits (Please refer to evaluation for details):  ADL's;IADL's    Body Structure / Function / Physical Skills  ADL;Strength;UE functional use;IADL;ROM;Coordination;FMC;Vision    Rehab Potential  Good    Clinical Decision Making  Several treatment options, min-mod task modification necessary    Modification or Assistance to Complete Evaluation   No modification of tasks or assist necessary to complete eval    OT Frequency  2x / week    OT Duration  12 weeks    OT Treatment/Interventions  Self-care/ADL training;DME and/or AE instruction;Therapeutic activities;Therapeutic exercise;Visual/perceptual remediation/compensation;Patient/family education;Energy conservation    Consulted and Agree with Plan of Care  Patient       Patient will benefit from skilled therapeutic intervention in order to improve the following deficits and impairments:   Body Structure / Function / Physical Skills: ADL, Strength, UE functional use, IADL, ROM, Coordination, FMC, Vision       Visit Diagnosis: Muscle weakness (generalized)  Other lack of coordination    Problem List Patient Active Problem List   Diagnosis Date Noted  . History of cerebrovascular accident (CVA) with residual deficit 01/27/2019  . 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 Intractable nausea and vomiting 01/10/2019  . 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 Hypokalemia 01/01/2019  . Elevated hemoglobin A1c 02/12/2018  . Dyslipidemia 02/05/2018  . Abnormal skin growth 11/15/2016  . PTSD (post-traumatic stress disorder) 10/24/2016  . Severe bipolar I disorder, most recent episode depressed without psychotic features ([place]) 10/23/2016  . Ankylosing spondylitis ([place]) 10/23/2016  . Sacroiliac inflammation ([place]) 08/02/2016  .  Hypertension 05/28/2016  . Vitamin D deficiency 05/02/2016  . 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 Hypothyroidism 02/13/2016  . Anxiety and depression 01/23/2016  . Status post tubal ligation 09/22/2015  . Morbid (severe) obesity due to excess calories ([place]) 04/25/2015  . Anemia, iron deficiency 01/04/2015  . Headache, migraine 01/04/2015  . Abnormal cytological findings in female genital organs 07/04/2011    Harrel Carina, MS, OTR/L 04/07/2019, 12:21 PM  [place] MAIN [facility] SERVICES [address] [place], [place], [address] Phone: [phone]   Fax:  [phone]  Name: MARTAVIA TYE MRN: [id number] Date of Birth: 1985/08/31

## 2019-04-07 NOTE — Patient Instructions (Signed)
I had a long discussion with the patient regarding her episode of dizziness, imbalance and diplopia in July 2020 likely representing small brainstem infarct not visualized on MRI and her MRI does show a corpus callosum and possibly an over the right periventricular lacunar infarct as well.  I recommend she reduce aspirin to 81 mg daily for stroke prevention and maintain aggressive risk factor modification with strict control of hypertension with blood pressure goal below 130/90, LDL cholesterol goal below 70 mg percent and diabetes with hemoglobin A1c goal below 7.0.  She was also advised to eat healthy and to lose weight.  I counseled her to be compliant with using her CPAP every night for her sleep apnea as well.Check transcranial Doppler bubble study for PFO.  Continue MRI scan of the brain with contrast and cervical spine as ordered by Dr. Jaynee Eagles.  Multiple sclerosis is in the differential diagnosis for patient her age but her clinical history is highly suggestive of that.  She will continue scheduled follow-up with Dr.Ahern and no schedule appointment with me was made.

## 2019-04-08 ENCOUNTER — Telehealth: Payer: Self-pay | Admitting: Neurology

## 2019-04-08 ENCOUNTER — Other Ambulatory Visit: Payer: Self-pay | Admitting: Neurology

## 2019-04-08 DIAGNOSIS — F3181 Bipolar II disorder: Secondary | ICD-10-CM | POA: Diagnosis not present

## 2019-04-08 MED ORDER — ALPRAZOLAM 0.25 MG PO TABS: ORAL_TABLET | ORAL | 0 refills | Status: DC

## 2019-04-08 NOTE — Telephone Encounter (Signed)
no to the covid questions MR Brain w/wo contrast & MR Cervical spine Dr. Ihor Dow Auth: Nenzel spoke to [facility] Ref # [id number]. Patient is scheduled at [facility] for 04/14/19.   She informed me she is claustrophobic and would need something to help her. She is aware to have a driver.

## 2019-04-08 NOTE — Telephone Encounter (Signed)
Sent to pharmacy thanks

## 2019-04-09 ENCOUNTER — Encounter: Payer: Self-pay | Admitting: Obstetrics and Gynecology

## 2019-04-09 ENCOUNTER — Ambulatory Visit: Payer: BC Managed Care – PPO | Admitting: Physical Therapy

## 2019-04-09 ENCOUNTER — Ambulatory Visit: Payer: BC Managed Care – PPO | Admitting: Occupational Therapy

## 2019-04-09 NOTE — Telephone Encounter (Signed)
noted 

## 2019-04-10 ENCOUNTER — Other Ambulatory Visit: Payer: Self-pay | Admitting: Family Medicine

## 2019-04-10 DIAGNOSIS — E039 Hypothyroidism, unspecified: Secondary | ICD-10-CM

## 2019-04-13 ENCOUNTER — Other Ambulatory Visit: Payer: Self-pay | Admitting: Obstetrics and Gynecology

## 2019-04-13 ENCOUNTER — Ambulatory Visit: Payer: BC Managed Care – PPO | Admitting: Occupational Therapy

## 2019-04-13 NOTE — Telephone Encounter (Signed)
Please see if patient is still using this medication. It says that I discontinued the prescription in April.

## 2019-04-14 ENCOUNTER — Other Ambulatory Visit: Payer: Self-pay

## 2019-04-14 ENCOUNTER — Ambulatory Visit (INDEPENDENT_AMBULATORY_CARE_PROVIDER_SITE_OTHER): Payer: BC Managed Care – PPO

## 2019-04-14 DIAGNOSIS — H532 Diplopia: Secondary | ICD-10-CM | POA: Diagnosis not present

## 2019-04-14 DIAGNOSIS — G35 Multiple sclerosis: Secondary | ICD-10-CM

## 2019-04-14 DIAGNOSIS — R9082 White matter disease, unspecified: Secondary | ICD-10-CM

## 2019-04-14 MED ORDER — GADOBENATE DIMEGLUMINE 529 MG/ML IV SOLN
20.0000 mL | Freq: Once | INTRAVENOUS | Status: AC | PRN
  Administered 2019-04-14: 12:00:00 20 mL via INTRAVENOUS

## 2019-04-14 MED ORDER — GADOBENATE DIMEGLUMINE 529 MG/ML IV SOLN: 20.0000 mL | Freq: Once | INTRAVENOUS | Status: DC | PRN

## 2019-04-15 ENCOUNTER — Other Ambulatory Visit (HOSPITAL_COMMUNITY)
Admission: RE | Admit: 2019-04-15 | Discharge: 2019-04-15 | Disposition: A | Discharge status: Home / Self Care | Payer: BC Managed Care – PPO | Source: Ambulatory Visit | Attending: Obstetrics and Gynecology | Admitting: Obstetrics and Gynecology

## 2019-04-15 ENCOUNTER — Encounter: Payer: Self-pay | Admitting: Obstetrics and Gynecology

## 2019-04-15 ENCOUNTER — Other Ambulatory Visit: Payer: Self-pay

## 2019-04-15 ENCOUNTER — Ambulatory Visit (INDEPENDENT_AMBULATORY_CARE_PROVIDER_SITE_OTHER): Payer: BC Managed Care – PPO | Admitting: Obstetrics and Gynecology

## 2019-04-15 ENCOUNTER — Other Ambulatory Visit (HOSPITAL_COMMUNITY): Payer: BC Managed Care – PPO

## 2019-04-15 VITALS — BP 138/85 | HR 80 | Ht 61.0 in | Wt 266.6 lb

## 2019-04-15 DIAGNOSIS — E039 Hypothyroidism, unspecified: Secondary | ICD-10-CM

## 2019-04-15 DIAGNOSIS — G8929 Other chronic pain: Secondary | ICD-10-CM

## 2019-04-15 DIAGNOSIS — Z9884 Bariatric surgery status: Secondary | ICD-10-CM

## 2019-04-15 DIAGNOSIS — Z01419 Encounter for gynecological examination (general) (routine) without abnormal findings: Secondary | ICD-10-CM

## 2019-04-15 DIAGNOSIS — I6930 Unspecified sequelae of cerebral infarction: Secondary | ICD-10-CM

## 2019-04-15 DIAGNOSIS — N946 Dysmenorrhea, unspecified: Secondary | ICD-10-CM

## 2019-04-15 DIAGNOSIS — N808 Other endometriosis: Secondary | ICD-10-CM

## 2019-04-15 DIAGNOSIS — L680 Hirsutism: Secondary | ICD-10-CM

## 2019-04-15 DIAGNOSIS — Z124 Encounter for screening for malignant neoplasm of cervix: Secondary | ICD-10-CM

## 2019-04-15 DIAGNOSIS — I10 Essential (primary) hypertension: Secondary | ICD-10-CM

## 2019-04-15 DIAGNOSIS — R102 Pelvic and perineal pain: Secondary | ICD-10-CM

## 2019-04-15 DIAGNOSIS — E785 Hyperlipidemia, unspecified: Secondary | ICD-10-CM

## 2019-04-15 MED ORDER — SPIRONOLACTONE 25 MG PO TABS: 25.0000 mg | ORAL_TABLET | Freq: Every day | ORAL | 3 refills | Status: DC

## 2019-04-15 NOTE — Progress Notes (Signed)
GYNECOLOGY ANNUAL PHYSICAL EXAM PROGRESS NOTE  Subjective:    Leah Osborne is a 33 y.o. G32P2002 female who presents for an annual exam. The patient has no complaints today. [place] patient is sexually active. The patient wears seatbelts: yes. The patient participates in regular exercise: yes (walking almost daily). Has the patient ever been transfused or tattooed?: no. The patient reports that there is not domestic violence in her life.   Of note, the patient mentions the following recent hospitalizations and events:  1. Reports h/o having bariatric surgery in June 2020 performed at [facility]. Reports that for approximately 2-3 months after, she had significant nausea and vomiting requiring several ER admissions for dehydration. Symptoms have finally resolved. Does report ~ 30 lb weight loss since June.  2. Patient reports that she was diagnosed with having an ischemic stroke in August. No know cause, but suspected due to vitamin deficiency. Has been going to rehab and seeing Neurology due to residual weakness on right side, will be finished by the end of November. Also with double vision that is slowly improving, being followed by  3. Patient reports doing well on Orilissa for management of endometriosis symptoms.  Has been on medication now for 6 months, currently on 150 mg dosing. Has not had a cycle. Pain is well controlled.  Has not had any botherosme side effects.  4. Needs refill on Spironolactone for management of hair growth from hirsutism.    Gynecologic History Menarche age: 22 No LMP recorded (lmp unknown). (Menstrual status: Irregular Periods). Contraception: tubal ligation History of STI's: Denies Last Pap: 4 years ago. Results were: normal.  Denies h/o abnormal pap smears.    OB History  Gravida Para Term Preterm AB Living  2 2 2  0 0 2  SAB TAB Ectopic Multiple Live Births  0 0 0 0 2    # Outcome Date GA Lbr Len/2nd Weight Sex Delivery Anes PTL Lv  2 Term 2016   8  lb 1.6 oz (3.674 kg) F Vag-Spont   LIV  1 Term 2013   5 lb 2.2 oz (2.331 kg) M Vag-Spont   LIV    Past Medical History:  Diagnosis Date  . Abnormal Pap smear of cervix   . Arthritis   . Arthritis, rheumatoid ([place])   . Atypical chest pain   . Bipolar 1 disorder ([place])   . Headache    MIGRAINES  . Hyperlipidemia   . Hypertension   . Morbid obesity ([place])    a. 11/2018 s/p Robotic Sleeve Gastrectomy @ [facility].  Marland Kitchen Palpitations    a. 09/2017 Zio Monitor: predominantly RSR, avg HR 87, 11 A tach/SVT episodes, fastest 176 x 7 beats, longest 9 beats. Rare PAC's, occas PVC's, rare couplets/triplets.  . Persistent Nausea and vomiting    a. Following sleeve gastrectomy 11/2018  . PTSD (post-traumatic stress disorder)   . PVC (premature ventricular contraction)   . Social phobia   . Stroke [facility])     Past Surgical History:  Procedure Laterality Date  . CHOLECYSTECTOMY    . LAPAROSCOPIC GASTRIC SLEEVE RESECTION  11/19/2018  . LAPAROSCOPIC TUBAL LIGATION Bilateral 09/19/2015   Procedure: LAPAROSCOPIC BILATERAL TUBAL BANDING;  Surgeon: Brayton Mars, MD;  Location: [facility] ORS;  Service: Gynecology;  Laterality: Bilateral;    Family History  Problem Relation Age of Onset  . Diabetes Maternal Grandmother   . Heart disease Maternal Grandmother   . Heart failure Maternal Grandmother   . Heart attack Maternal Grandmother   .  Rheum arthritis Mother   . Healthy Father   . Heart Problems Maternal Grandfather   . Stroke Maternal Grandfather   . Atrial fibrillation Paternal Grandmother   . Stroke Other        mat great gf had stroke at 29  . Cancer Neg Hx     Social History   Socioeconomic History  . Marital status: Married    Spouse name: Not on file  . Number of children: Not on file  . Years of education: Not on file  . Highest education level: Not on file  Occupational History  . Not on file  Social Needs  . Financial resource strain: Not on file  . Food insecurity    Worry: Not on  file    Inability: Not on file  . Transportation needs    Medical: Not on file    Non-medical: Not on file  Tobacco Use  . Smoking status: Never Smoker  . Smokeless tobacco: Never Used  Substance and Sexual Activity  . Alcohol use: Not Currently    Alcohol/week: 0.0 standard drinks  . Drug use: No  . Sexual activity: Yes    Birth control/protection: Surgical    Comment: tubal  Lifestyle  . Physical activity    Days per week: Not on file    Minutes per session: Not on file  . Stress: Not on file  Relationships  . Social Herbalist on phone: Not on file    Gets together: Not on file    Attends religious service: Not on file    Active member of club or organization: Not on file    Attends meetings of clubs or organizations: Not on file    Relationship status: Not on file  . Intimate partner violence    Fear of current or ex partner: Not on file    Emotionally abused: Not on file    Physically abused: Not on file    Forced sexual activity: Not on file  Other Topics Concern  . Not on file  Social History Narrative   Lives in [place] with husband   Right handed    Current Outpatient Medications on File Prior to Visit  Medication Sig Dispense Refill  . aspirin EC 81 MG tablet Take 1 tablet (81 mg total) by mouth daily. (Patient taking differently: Take 325 mg by mouth daily. ) 150 tablet 2  . atorvastatin (LIPITOR) 40 MG tablet Take 1 tablet (40 mg total) by mouth daily at 6 PM. 30 tablet 0  . Elagolix Sodium (ORILISSA) 150 MG TABS Take by mouth.    . esomeprazole (NEXIUM) 40 MG capsule Take 40 mg by mouth daily.     Marland Kitchen levothyroxine (SYNTHROID) 25 MCG tablet TAKE 1 TABLET DAILY BEFORE BREAKFAST 90 tablet 3  . OLANZapine zydis (ZYPREXA) 5 MG disintegrating tablet Take 0.5 tablets (2.5 mg total) by mouth 2 (two) times a day. 14 tablet 0  . thiamine (VITAMIN B-1) 100 MG tablet Start 1 tab (100mg ) 3 times a day for 2 weeks, then reduce to 1 tab (100mg ) daily for  maintenance 90 tablet 1  . ALPRAZolam (XANAX) 0.25 MG tablet Take 1-2 tabs (0.25mg -0.50mg ) 30-60 minutes before procedure. May repeat if needed.Do not drive. (Patient not taking: Reported on 04/15/2019) 4 tablet 0   No current facility-administered medications on file prior to visit.     No Known Allergies    Review of Systems Constitutional: negative for chills, fatigue, fevers and sweats  Eyes: negative for irritation, redness and visual disturbance Ears, nose, mouth, throat, and face: negative for hearing loss, nasal congestion, snoring and tinnitus Respiratory: negative for asthma, cough, sputum Cardiovascular: negative for chest pain, dyspnea, exertional chest pressure/discomfort, irregular heart beat, palpitations and syncope Gastrointestinal: negative for abdominal pain, change in bowel habits, nausea and vomiting Genitourinary: negative for abnormal menstrual periods, genital lesions, sexual problems and vaginal discharge, dysuria and urinary incontinence Integument/breast: negative for breast lump, breast tenderness and nipple discharge Hematologic/lymphatic: negative for bleeding and easy bruising Musculoskeletal:negative for back pain and muscle weakness Neurological: negative for dizziness, headaches, vertigo and weakness Endocrine: negative for diabetic symptoms including polydipsia, polyuria and skin dryness Allergic/Immunologic: negative for hay fever and urticaria        Objective:  Blood pressure 138/85, pulse 80, height 5\' 1"  (1.549 m), weight 266 lb 9.6 oz (120.9 kg). Body mass index is 50.37 kg/m.   General Appearance:    Alert, cooperative, no distress, appears stated age. Morbid obesity  Head:    Normocephalic, without obvious abnormality, atraumatic  Eyes:    PERRL, conjunctiva/corneas clear, EOM's intact, both eyes  Ears:    Normal external ear canals, both ears  Nose:   Nares normal, septum midline, mucosa normal, no drainage or sinus tenderness  Throat:    Lips, mucosa, and tongue normal; teeth and gums normal  Neck:   Supple, symmetrical, trachea midline, no adenopathy; thyroid: no enlargement/tenderness/nodules; no carotid bruit or JVD  Back:     Symmetric, no curvature, ROM normal, no CVA tenderness  Lungs:     Clear to auscultation bilaterally, respirations unlabored  Chest Wall:    No tenderness or deformity   Heart:    Regular rate and rhythm, S1 and S2 normal, no murmur, rub or gallop  Breast Exam:    No tenderness, masses, or nipple abnormality  Abdomen:     Soft, non-tender, bowel sounds active all four quadrants, no masses, no organomegaly.    Genitalia:    Pelvic:external genitalia normal, vagina without lesions, discharge, or tenderness, rectovaginal septum  normal. Cervix normal in appearance, no cervical motion tenderness, no adnexal masses or tenderness.  Uterus normal size, shape, mobile, regular contours, nontender.  Rectal:    Normal external sphincter.  No hemorrhoids appreciated. Internal exam not done.   Extremities:   Extremities normal, atraumatic, no cyanosis or edema  Pulses:   2+ and symmetric all extremities  Skin:   Skin color, texture, turgor normal, no rashes or lesions  Lymph nodes:   Cervical, supraclavicular, and axillary nodes normal  Neurologic:   CNII-XII intact, normal strength, sensation and reflexes throughout   .  Labs:  Lab Results  Component Value Date   WBC 7.0 02/19/2019   HGB 13.4 02/19/2019   HCT 41.7 02/19/2019   MCV 90 02/19/2019   PLT 380 02/19/2019    Lab Results  Component Value Date   CREATININE 0.78 02/19/2019   BUN 9 02/19/2019   NA 142 02/19/2019   K 4.3 02/19/2019   CL 104 02/19/2019   CO2 23 02/19/2019    Lab Results  Component Value Date   ALT 36 (H) 02/19/2019   AST 30 02/19/2019   ALKPHOS 75 02/19/2019   BILITOT 0.4 02/19/2019    Lab Results  Component Value Date   TSH 3.779 01/15/2019    Lab Results  Component Value Date   HGBA1C 4.9 02/19/2019      Assessment:   1. Encounter for well woman exam with  routine gynecological exam   2. History of cerebrovascular accident (CVA) with residual deficit   3. Other endometriosis   4. Morbid obesity ([place])   5. Pap smear for cervical cancer screening   6. S/P laparoscopic sleeve gastrectomy   7. Dyslipidemia   8. Essential hypertension   9. Hypothyroidism, unspecified type   10. Hirsutism   11. Severe dysmenorrhea   12. Chronic pelvic pain in female     Plan:    Blood tests: None ordered. Up to date. Breast self exam technique reviewed and patient encouraged to perform self-exam monthly. Contraception: tubal ligation. Discussed healthy lifestyle modifications. Pap smear performed today .  Continue Orilissa for endometriosis and pelvic pain symptoms. Doing well.  S/p gastric bypass, losing weight, vitamin deficiency corrected.  Hirsutism, managed by Spironolactone.  Will refill.  Medical comorbidities maanged by PCP.  H/o CVA, managed by Neurology Patient thinks she received her flu shot last month.  Return to clinic in 1 year for annual exam.    Rubie Maid, MD [facility]

## 2019-04-15 NOTE — Progress Notes (Signed)
Pt present for annual exam. Pt stated that she was doing well. Pt had flu vaccine in Sept 2020. Denies any issues at this time.

## 2019-04-15 NOTE — Patient Instructions (Signed)
Health Maintenance, Female Adopting a healthy lifestyle and getting preventive care are important in promoting health and wellness. Ask your health care provider about:  The right schedule for you to have regular tests and exams.  Things you can do on your own to prevent diseases and keep yourself healthy. What should I know about diet, weight, and exercise? Eat a healthy diet   Eat a diet that includes plenty of vegetables, fruits, low-fat dairy products, and lean protein.  Do not eat a lot of foods that are high in solid fats, added sugars, or sodium. Maintain a healthy weight Body mass index (BMI) is used to identify weight problems. It estimates body fat based on height and weight. Your health care provider can help determine your BMI and help you achieve or maintain a healthy weight. Get regular exercise Get regular exercise. This is one of the most important things you can do for your health. Most adults should:  Exercise for at least 150 minutes each week. The exercise should increase your heart rate and make you sweat (moderate-intensity exercise).  Do strengthening exercises at least twice a week. This is in addition to the moderate-intensity exercise.  Spend less time sitting. Even light physical activity can be beneficial. Watch cholesterol and blood lipids Have your blood tested for lipids and cholesterol at 31 years of age, then have this test every 5 years. Have your cholesterol levels checked more often if:  Your lipid or cholesterol levels are high.  You are older than 31 years of age.  You are at high risk for heart disease. What should I know about cancer screening? Depending on your health history and family history, you may need to have cancer screening at various ages. This may include screening for:  Breast cancer.  Cervical cancer.  Colorectal cancer.  Skin cancer.  Lung cancer. What should I know about heart disease, diabetes, and high blood  pressure? Blood pressure and heart disease  High blood pressure causes heart disease and increases the risk of stroke. This is more likely to develop in people who have high blood pressure readings, are of African descent, or are overweight.  Have your blood pressure checked: ? Every 3-5 years if you are 18-39 years of age. ? Every year if you are 40 years old or older. Diabetes Have regular diabetes screenings. This checks your fasting blood sugar level. Have the screening done:  Once every three years after age 40 if you are at a normal weight and have a low risk for diabetes.  More often and at a younger age if you are overweight or have a high risk for diabetes. What should I know about preventing infection? Hepatitis B If you have a higher risk for hepatitis B, you should be screened for this virus. Talk with your health care provider to find out if you are at risk for hepatitis B infection. Hepatitis C Testing is recommended for:  Everyone born from 1945 through 1965.  Anyone with known risk factors for hepatitis C. Sexually transmitted infections (STIs)  Get screened for STIs, including gonorrhea and chlamydia, if: ? You are sexually active and are younger than 31 years of age. ? You are older than 31 years of age and your health care provider tells you that you are at risk for this type of infection. ? Your sexual activity has changed since you were last screened, and you are at increased risk for chlamydia or gonorrhea. Ask your health care provider if   you are at risk.  Ask your health care provider about whether you are at high risk for HIV. Your health care provider may recommend a prescription medicine to help prevent HIV infection. If you choose to take medicine to prevent HIV, you should first get tested for HIV. You should then be tested every 3 months for as long as you are taking the medicine. Pregnancy  If you are about to stop having your period (premenopausal) and  you may become pregnant, seek counseling before you get pregnant.  Take 400 to 800 micrograms (mcg) of folic acid every day if you become pregnant.  Ask for birth control (contraception) if you want to prevent pregnancy. Osteoporosis and menopause Osteoporosis is a disease in which the bones lose minerals and strength with aging. This can result in bone fractures. If you are 65 years old or older, or if you are at risk for osteoporosis and fractures, ask your health care provider if you should:  Be screened for bone loss.  Take a calcium or vitamin D supplement to lower your risk of fractures.  Be given hormone replacement therapy (HRT) to treat symptoms of menopause. Follow these instructions at home: Lifestyle  Do not use any products that contain nicotine or tobacco, such as cigarettes, e-cigarettes, and chewing tobacco. If you need help quitting, ask your health care provider.  Do not use street drugs.  Do not share needles.  Ask your health care provider for help if you need support or information about quitting drugs. Alcohol use  Do not drink alcohol if: ? Your health care provider tells you not to drink. ? You are pregnant, may be pregnant, or are planning to become pregnant.  If you drink alcohol: ? Limit how much you use to 0-1 drink a day. ? Limit intake if you are breastfeeding.  Be aware of how much alcohol is in your drink. In the [place] one drink equals one 12 oz bottle of beer (355 mL), one 5 oz glass of wine (148 mL), or one 1 oz glass of hard liquor (44 mL). General instructions  Schedule regular health, dental, and eye exams.  Stay current with your vaccines.  Tell your health care provider if: ? You often feel depressed. ? You have ever been abused or do not feel safe at home. Summary  Adopting a healthy lifestyle and getting preventive care are important in promoting health and wellness.  Follow your health care provider's instructions about healthy  diet, exercising, and getting tested or screened for diseases.  Follow your health care provider's instructions on monitoring your cholesterol and blood pressure. This information is not intended to replace advice given to you by your health care provider. Make sure you discuss any questions you have with your health care provider. Document Released: 12/18/2010 Document Revised: 05/28/2018 Document Reviewed: 05/28/2018 Elsevier Patient Education  2020 [employer]vier Inc.  

## 2019-04-16 ENCOUNTER — Ambulatory Visit: Payer: BC Managed Care – PPO | Admitting: Occupational Therapy

## 2019-04-16 ENCOUNTER — Other Ambulatory Visit: Payer: Self-pay | Admitting: Nurse Practitioner

## 2019-04-16 DIAGNOSIS — E785 Hyperlipidemia, unspecified: Secondary | ICD-10-CM

## 2019-04-16 DIAGNOSIS — I6930 Unspecified sequelae of cerebral infarction: Secondary | ICD-10-CM

## 2019-04-17 NOTE — Telephone Encounter (Signed)
Pt called no answer LM via VM that we had received a medication refill and wanted to know if she still needed the medication. Pt was advised to call the office with answer.

## 2019-04-19 ENCOUNTER — Other Ambulatory Visit: Payer: Self-pay | Admitting: Neurology

## 2019-04-19 DIAGNOSIS — E785 Hyperlipidemia, unspecified: Secondary | ICD-10-CM

## 2019-04-19 DIAGNOSIS — I6930 Unspecified sequelae of cerebral infarction: Secondary | ICD-10-CM

## 2019-04-19 MED ORDER — ATORVASTATIN CALCIUM 40 MG PO TABS: 40.0000 mg | ORAL_TABLET | Freq: Every day | ORAL | 3 refills | Status: DC

## 2019-04-20 ENCOUNTER — Other Ambulatory Visit: Payer: Self-pay | Admitting: Nurse Practitioner

## 2019-04-20 DIAGNOSIS — E785 Hyperlipidemia, unspecified: Secondary | ICD-10-CM

## 2019-04-20 DIAGNOSIS — I6930 Unspecified sequelae of cerebral infarction: Secondary | ICD-10-CM

## 2019-04-20 NOTE — Telephone Encounter (Signed)
Spoke with pt concerning medication spironolactone. Pt stated that she only stopped th medication to have surgery but is currently taking the medication daily. Pt is aware that [facility] refilled her medication on 04/15/19 with 3 refills.

## 2019-04-20 NOTE — Telephone Encounter (Signed)
I spoke with pt  

## 2019-04-21 ENCOUNTER — Encounter: Payer: BC Managed Care – PPO | Admitting: Occupational Therapy

## 2019-04-21 ENCOUNTER — Ambulatory Visit: Payer: BC Managed Care – PPO | Attending: Neurology

## 2019-04-21 DIAGNOSIS — Z8669 Personal history of other diseases of the nervous system and sense organs: Secondary | ICD-10-CM | POA: Insufficient documentation

## 2019-04-21 DIAGNOSIS — R2681 Unsteadiness on feet: Secondary | ICD-10-CM | POA: Insufficient documentation

## 2019-04-21 DIAGNOSIS — M6281 Muscle weakness (generalized): Secondary | ICD-10-CM | POA: Insufficient documentation

## 2019-04-21 DIAGNOSIS — H543 Unqualified visual loss, both eyes: Secondary | ICD-10-CM | POA: Insufficient documentation

## 2019-04-21 DIAGNOSIS — R278 Other lack of coordination: Secondary | ICD-10-CM | POA: Insufficient documentation

## 2019-04-21 DIAGNOSIS — I69351 Hemiplegia and hemiparesis following cerebral infarction affecting right dominant side: Secondary | ICD-10-CM | POA: Insufficient documentation

## 2019-04-22 LAB — CYTOLOGY - PAP
Adequacy: ABSENT
Comment: NEGATIVE
Diagnosis: NEGATIVE
High risk HPV: NEGATIVE

## 2019-04-23 ENCOUNTER — Encounter: Payer: BC Managed Care – PPO | Admitting: Occupational Therapy

## 2019-04-28 ENCOUNTER — Encounter: Payer: BC Managed Care – PPO | Admitting: Occupational Therapy

## 2019-04-30 ENCOUNTER — Other Ambulatory Visit: Payer: Self-pay

## 2019-04-30 ENCOUNTER — Encounter: Payer: Self-pay | Admitting: Physical Therapy

## 2019-04-30 ENCOUNTER — Encounter: Payer: BC Managed Care – PPO | Admitting: Occupational Therapy

## 2019-04-30 ENCOUNTER — Ambulatory Visit: Payer: BC Managed Care – PPO | Admitting: Physical Therapy

## 2019-04-30 DIAGNOSIS — R278 Other lack of coordination: Secondary | ICD-10-CM

## 2019-04-30 DIAGNOSIS — I69351 Hemiplegia and hemiparesis following cerebral infarction affecting right dominant side: Secondary | ICD-10-CM

## 2019-04-30 DIAGNOSIS — H543 Unqualified visual loss, both eyes: Secondary | ICD-10-CM

## 2019-04-30 DIAGNOSIS — Z8669 Personal history of other diseases of the nervous system and sense organs: Secondary | ICD-10-CM | POA: Diagnosis not present

## 2019-04-30 DIAGNOSIS — R2681 Unsteadiness on feet: Secondary | ICD-10-CM

## 2019-04-30 DIAGNOSIS — M6281 Muscle weakness (generalized): Secondary | ICD-10-CM

## 2019-04-30 NOTE — Therapy (Signed)
[place] MAIN [facility] SERVICES [address] [place], [place], [address] Phone: [phone]   Fax:  [phone]  Physical Therapy Treatment  Patient Details  Name: Leah Osborne MRN: [id number] Date of Birth: [birth year] Referring Provider (PT): Sarina Ill, MD    Encounter Date: 04/30/2019  PT End of Session - 04/30/19 1156    Visit Number  5    Number of Visits  8    Date for PT Re-Evaluation  05/11/19    PT Start Time  [id number]    PT Stop Time  1230    PT Time Calculation (min)  38 min    Equipment Utilized During Treatment  Gait belt    Activity Tolerance  Patient tolerated treatment well;No increased pain    Behavior During Therapy  WFL for tasks assessed/performed       Past Medical History:  Diagnosis Date  . Abnormal Pap smear of cervix   . Arthritis   . Arthritis, rheumatoid ([place])   . Atypical chest pain   . Bipolar 1 disorder ([place])   . Headache    MIGRAINES  . Hyperlipidemia   . Hypertension   . Morbid obesity ([place])    a. 11/2018 s/p Robotic Sleeve Gastrectomy @ [facility].  Marland Kitchen Palpitations    a. 09/2017 Zio Monitor: predominantly RSR, avg HR 87, 11 A tach/SVT episodes, fastest 176 x 7 beats, longest 9 beats. Rare PAC's, occas PVC's, rare couplets/triplets.  . Persistent Nausea and vomiting    a. Following sleeve gastrectomy 11/2018  . PTSD (post-traumatic stress disorder)   . PVC (premature ventricular contraction)   . Social phobia   . Stroke [facility])     Past Surgical History:  Procedure Laterality Date  . CHOLECYSTECTOMY    . LAPAROSCOPIC GASTRIC SLEEVE RESECTION  11/19/2018  . LAPAROSCOPIC TUBAL LIGATION Bilateral 09/19/2015   Procedure: LAPAROSCOPIC BILATERAL TUBAL BANDING;  Surgeon: Brayton Mars, MD;  Location: [facility] ORS;  Service: Gynecology;  Laterality: Bilateral;    There were no vitals filed for this visit.  Subjective Assessment - 04/30/19 1156    Subjective  Patient reports doing well; denies any  soreness; She presents to therapy without cane; She reports she hasn't needed it for last week and a half.    Pertinent History  Pt sustained a CVA in July 2020 c inititally Rt sided weakness (arm and leg), diplopia (now resolved), dizziness (now resolved), and some occasional LOB. Hx of ankylosing spondylosis, gastric sleeve surgery in June, tubal ligation, and cholecystectomy.    How long can you stand comfortably?  Limited more by Hx of ankylosing spondylosis.    How long can you walk comfortably?  Not limited at this time.       TREATMENT: Exercise: Warm up on octane fitness L 4 x 5  min   Standing with green tband around BLE: Side stepping x10 feet x3 laps unsupported to challenge hip strength and balance;  Leg press 100# BLE 2x20  Single leg RLE 75 lbs x 2 20- Calf raises 60# RLE 2x20  Requires min VCs for proper positioning and to slow down LE movement for better motor control;   Lunge to bosu ball  x10 reps unsupported with CGA to close supervision for safety; Pt reports increased fatigue but was able to complete well without difficulty;     Standing on airex foam: Tandem stand on level surface and foam x 1 min  Rocker board lateral shifts with ball rotation opposite  x10 reps with cues for reaching forward for better  control; Patient required min VCs for balance stability, including to increase trunk control for less loss of balance with smaller base of support  Response to treatment: Pt tolerated well. She requires cues for proper positioning and exercise technique. Patient did have difficulty with rocker board requiring cues for weight shift and to improve core activation for better stability. ;                         PT Education - 04/30/19 1156    Education Details  HEP    Person(s) Educated  Patient    Methods  Explanation;Demonstration    Comprehension  Verbalized understanding       PT Short Term Goals - 03/11/19 1323      PT SHORT  TERM GOAL #1   Title  After 4 weeks pt will report no additional falls related to stairs performance.    Baseline  3 falls in 6 weeks ascending stairs.    Time  4    Period  Weeks    Target Date  04/10/19        PT Long Term Goals - 03/11/19 1323      PT LONG TERM GOAL #1   Title  After 8 weeks patient will demonstrate 30sec Chair rise >14x without visually apparent abberant Right knee locking patterns compared to Left knee.    Baseline  hands free, 11x, Right knee out of sync with Left knee motor patterns after 5x    Time  8    Period  Weeks    Status  New    Target Date  05/11/19      PT LONG TERM GOAL #2   Title  After 8 weeks pt will demonstrate performance of 16 stairs step-over-step pattern, SPC/rails ad lib without right knee instability.    Time  8    Period  Weeks    Status  New    Target Date  05/11/19      PT LONG TERM GOAL #3   Title  After 8 weeks pt will demonstrate SLS >30sec bilat, without Rt knee instability.    Time  8    Period  Weeks    Status  New    Target Date  05/11/19      PT LONG TERM GOAL #4   Title  After 8 weeks pt will demonstrate 10MWT >1.2m/s c SPC PRN    Baseline  1.45m/s c SPC    Time  8    Period  Weeks    Status  New    Target Date  05/11/19            Plan - 04/30/19 1157    Clinical Impression Statement  Patient instructed in intermediate mobility challenges, transfer training and safety training. Patient required min-mod VCs for correct positioning; Patient had increased difficulty with dynamic balance challenges and dule task movements especially in standing.  Patient would benefit from additional skilled PT intervention to improve strength, balance.   Personal Factors and Comorbidities  Fitness;Age;Time since onset of injury/illness/exacerbation    Examination-Activity Limitations  Locomotion Level;Transfers;Sit;Lift;Stand;Squat;Other   carrying 3yo child   Examination-Participation Restrictions   Cleaning;Shop;Laundry;Yard Work;Other   parenting   Stability/Clinical Decision Making  Stable/Uncomplicated    Rehab Potential  Excellent    PT Frequency  1x / week    PT Duration  8 weeks    PT  Treatment/Interventions  ADLs/Self Care Home Management;Electrical Stimulation;Moist Heat;Traction;Ultrasound;DME Instruction;Gait training;Stair training;Functional mobility training;Therapeutic activities;Therapeutic exercise;Balance training;Neuromuscular re-education;Patient/family education;Manual techniques;Energy conservation;Joint Manipulations;Taping;Passive range of motion    PT Next Visit Plan  Review some functional tests pertinent to caregiver roles: carrying a child, doing laundry, etc.    PT Home Exercise Plan  None established    Consulted and Agree with Plan of Care  Patient       Patient will benefit from skilled therapeutic intervention in order to improve the following deficits and impairments:  Abnormal gait, Decreased coordination, Decreased activity tolerance, Decreased balance, Decreased mobility, Decreased strength, Hypermobility, Difficulty walking  Visit Diagnosis: Other lack of coordination  Muscle weakness (generalized)  Unsteadiness on feet  Hemiplegia and hemiparesis following cerebral infarction affecting right dominant side (HCC)  Low vision, both eyes  History of diplopia     Problem List Patient Active Problem List   Diagnosis Date Noted  . History of cerebrovascular accident (CVA) with residual deficit 01/27/2019  . 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 Intractable nausea and vomiting 01/10/2019  . 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 Hypokalemia 01/01/2019  . Elevated hemoglobin A1c 02/12/2018  . Dyslipidemia 02/05/2018  . Abnormal skin growth 11/15/2016  . PTSD (post-traumatic stress disorder) 10/24/2016  . Severe bipolar I disorder, most recent episode depressed  without psychotic features ([place]) 10/23/2016  . Ankylosing spondylitis ([place]) 10/23/2016  . Sacroiliac inflammation ([place]) 08/02/2016  . Hypertension 05/28/2016  . Vitamin D deficiency 05/02/2016  . 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 Hypothyroidism 02/13/2016  . Anxiety and depression 01/23/2016  . Status post tubal ligation 09/22/2015  . Morbid (severe) obesity due to excess calories ([place]) 04/25/2015  . Anemia, iron deficiency 01/04/2015  . Headache, migraine 01/04/2015  . Abnormal cytological findings in female genital organs 07/04/2011    Alanson Puls PT DPT 04/30/2019, 11:58 AM  [place] MAIN [facility] SERVICES [place], [place], [address] Phone: [phone]   Fax:  [phone]  Name: AVANTE BRACK MRN: [id number] Date of Birth: 1986-01-26

## 2019-05-05 ENCOUNTER — Ambulatory Visit: Payer: BC Managed Care – PPO | Admitting: Physical Therapy

## 2019-05-05 ENCOUNTER — Encounter: Payer: Self-pay | Admitting: Physical Therapy

## 2019-05-05 ENCOUNTER — Encounter: Payer: BC Managed Care – PPO | Admitting: Occupational Therapy

## 2019-05-05 ENCOUNTER — Other Ambulatory Visit: Payer: Self-pay

## 2019-05-05 DIAGNOSIS — H543 Unqualified visual loss, both eyes: Secondary | ICD-10-CM | POA: Diagnosis not present

## 2019-05-05 DIAGNOSIS — M6281 Muscle weakness (generalized): Secondary | ICD-10-CM | POA: Diagnosis not present

## 2019-05-05 DIAGNOSIS — R278 Other lack of coordination: Secondary | ICD-10-CM | POA: Diagnosis not present

## 2019-05-05 DIAGNOSIS — R2681 Unsteadiness on feet: Secondary | ICD-10-CM

## 2019-05-05 DIAGNOSIS — I69351 Hemiplegia and hemiparesis following cerebral infarction affecting right dominant side: Secondary | ICD-10-CM | POA: Diagnosis not present

## 2019-05-05 DIAGNOSIS — Z8669 Personal history of other diseases of the nervous system and sense organs: Secondary | ICD-10-CM | POA: Diagnosis not present

## 2019-05-05 NOTE — Patient Instructions (Signed)
Exercise Program: Warm up going up/down steps 5 times  Then walk around your home for 10-15 min (set a timer on your phone or use a kitchen timer)  Other exercises to consider: Sit to stand from couch (10x) Standing squats 10-15x Marching in place for 1-2 min with high knee march Heel/toe raises [employer] with going up/down steps 5 times

## 2019-05-05 NOTE — Therapy (Signed)
[place] MAIN [facility] SERVICES [address] [place], [place], [address] Phone: [phone]   Fax:  [phone]  Physical Therapy Treatment/Discharge Summary  Patient Details  Name: Leah Osborne MRN: [id number] Date of Birth: Feb 19, 1986 Referring Provider (PT): Sarina Ill, MD    Encounter Date: 05/05/2019  PT End of Session - 05/05/19 1315    Visit Number  6    Number of Visits  8    Date for PT Re-Evaluation  05/11/19    PT Start Time  1301    PT Stop Time  1339    PT Time Calculation (min)  38 min    Equipment Utilized During Treatment  Gait belt    Activity Tolerance  Patient tolerated treatment well;No increased pain    Behavior During Therapy  WFL for tasks assessed/performed       Past Medical History:  Diagnosis Date  . Abnormal Pap smear of cervix   . Arthritis   . Arthritis, rheumatoid ([place])   . Atypical chest pain   . Bipolar 1 disorder ([place])   . Headache    MIGRAINES  . Hyperlipidemia   . Hypertension   . Morbid obesity ([place])    a. 11/2018 s/p Robotic Sleeve Gastrectomy @ [facility].  Marland Kitchen Palpitations    a. 09/2017 Zio Monitor: predominantly RSR, avg HR 87, 11 A tach/SVT episodes, fastest 176 x 7 beats, longest 9 beats. Rare PAC's, occas PVC's, rare couplets/triplets.  . Persistent Nausea and vomiting    a. Following sleeve gastrectomy 11/2018  . PTSD (post-traumatic stress disorder)   . PVC (premature ventricular contraction)   . Social phobia   . Stroke [facility])     Past Surgical History:  Procedure Laterality Date  . CHOLECYSTECTOMY    . LAPAROSCOPIC GASTRIC SLEEVE RESECTION  11/19/2018  . LAPAROSCOPIC TUBAL LIGATION Bilateral 09/19/2015   Procedure: LAPAROSCOPIC BILATERAL TUBAL BANDING;  Surgeon: Brayton Mars, MD;  Location: [facility] ORS;  Service: Gynecology;  Laterality: Bilateral;    There were no vitals filed for this visit.  Subjective Assessment - 05/05/19 1314    Subjective  Patient reports doing  well; no new falls, has been walking without AD, no difficulty with walking at home or in the community    Pertinent History  Pt sustained a CVA in July 2020 c inititally Rt sided weakness (arm and leg), diplopia (now resolved), dizziness (now resolved), and some occasional LOB. Hx of ankylosing spondylosis, gastric sleeve surgery in June, tubal ligation, and cholecystectomy.    How long can you stand comfortably?  Limited more by Hx of ankylosing spondylosis.    How long can you walk comfortably?  Not limited at this time.    Currently in Pain?  No/denies    Multiple Pain Sites  No         [facility] PT Assessment - 05/05/19 0001      6 minute walk test results    Aerobic Endurance Distance Walked  1185    Endurance additional comments  without AD, community ambulator, less than age group norms of >1600 feet, no AD, good reciprocal gait pattern;      Standardized Balance Assessment   10 Meter Walk  1.18 m/s without AD, community ambulator, improved from1.08 m/s with SPC on 03/11/19      High Level Balance   High Level Balance Comments  30 sec chair rise: 15x with good control and positioning;  SLS: 10-12 sec each LE unsupported;  improved from eval on 9/23: which was 11x       TREATMENT: Instructed patient in 6 min walk, 10 meter walk, 30 sec sit<>Stand, SLS etc to address goals  Patient ascend/descend 4 steps x4 laps with 1-2 rail assist, forward reciprocal with good motor control and balance. Patient independent in negotiating steps without difficulty;  Instructed patient in HEP and ways to incorporate fitness in daily life.  She will be working 10 hours a day, 4 days a week which limits her time/availability for exercise  Patient verbalized understanding and stated that she could do some exercise while at work.                    PT Education - 05/05/19 1314    Education Details  HEP/progress towards goals    Person(s) Educated  Patient    Methods   Explanation;Verbal cues    Comprehension  Verbalized understanding;Verbal cues required;Need further instruction       PT Short Term Goals - 05/05/19 1315      PT SHORT TERM GOAL #1   Title  After 4 weeks pt will report no additional falls related to stairs performance.    Baseline  3 falls in 6 weeks ascending stairs. 11/17: no falls with stairs, no difficulty;    Time  4    Period  Weeks    Status  Achieved    Target Date  04/10/19        PT Long Term Goals - 05/05/19 1315      PT LONG TERM GOAL #1   Title  After 8 weeks patient will demonstrate 30sec Chair rise >14x without visually apparent abberant Right knee locking patterns compared to Left knee.    Baseline  hands free, 11x, Right knee out of sync with Left knee motor patterns after 5x; 11/17: 15x    Time  8    Period  Weeks    Status  Achieved    Target Date  05/11/19      PT LONG TERM GOAL #2   Title  After 8 weeks pt will demonstrate performance of 16 stairs step-over-step pattern, SPC/rails ad lib without right knee instability.    Time  8    Period  Weeks    Status  Achieved    Target Date  05/11/19      PT LONG TERM GOAL #3   Title  After 8 weeks pt will demonstrate SLS >30sec bilat, without Rt knee instability.    Time  8    Period  Weeks    Status  Partially Met    Target Date  05/11/19      PT LONG TERM GOAL #4   Title  After 8 weeks pt will demonstrate 10MWT >1.79ms c SPC PRN    Baseline  1.068m c SPC, 05/05/19: 1.18 m/s without AD    Time  8    Period  Weeks    Status  Partially Met    Target Date  05/11/19            Plan - 05/05/19 1336    Clinical Impression Statement  Patient motivated and participated well within session. PT assessed gait, balance and overall mobility. She is currently ambulating independently with good reciprocal gait pattern. She is walking a little slower but is at a community ambulator speed. She is able to maintain SLS for 10-12 sec each LE. Patient denies any  recent falls. [place]  and ways to incorporate exercise as part of daily life for better adherence and overall health. She has met most goals and is hoping to return to work soon. She is appropriate for discharge from PT at this time. Patient agreeable.    Personal Factors and Comorbidities  Fitness;Age;Time since onset of injury/illness/exacerbation    Examination-Activity Limitations  Locomotion Level;Transfers;Sit;Lift;Stand;Squat;Other   carrying 3yo child   Examination-Participation Restrictions  Cleaning;Shop;Laundry;Yard Work;Other   parenting   Stability/Clinical Decision Making  Stable/Uncomplicated    Rehab Potential  Excellent    PT Frequency  1x / week    PT Duration  8 weeks    PT Treatment/Interventions  ADLs/Self Care Home Management;Electrical Stimulation;Moist Heat;Traction;Ultrasound;DME Instruction;Gait training;Stair training;Functional mobility training;Therapeutic activities;Therapeutic exercise;Balance training;Neuromuscular re-education;Patient/family education;Manual techniques;Energy conservation;Joint Manipulations;Taping;Passive range of motion    PT Next Visit Plan  Review some functional tests pertinent to caregiver roles: carrying a child, doing laundry, etc.    PT Home Exercise Plan  None established    Consulted and Agree with Plan of Care  Patient       Patient will benefit from skilled therapeutic intervention in order to improve the following deficits and impairments:  Abnormal gait, Decreased coordination, Decreased activity tolerance, Decreased balance, Decreased mobility, Decreased strength, Hypermobility, Difficulty walking  Visit Diagnosis: Other lack of coordination  Muscle weakness (generalized)  Unsteadiness on feet     Problem List Patient Active Problem List   Diagnosis Date Noted  . History of cerebrovascular accident (CVA) with residual deficit 01/27/2019  . 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 Binocular vision  disorder with diplopia 01/15/2019  . Intractable nausea and vomiting 01/10/2019  . 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 Hypokalemia 01/01/2019  . Elevated hemoglobin A1c 02/12/2018  . Dyslipidemia 02/05/2018  . Abnormal skin growth 11/15/2016  . PTSD (post-traumatic stress disorder) 10/24/2016  . Severe bipolar I disorder, most recent episode depressed without psychotic features ([place]) 10/23/2016  . Ankylosing spondylitis ([place]) 10/23/2016  . Sacroiliac inflammation ([place]) 08/02/2016  . Hypertension 05/28/2016  . Vitamin D deficiency 05/02/2016  . 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 Hypothyroidism 02/13/2016  . Anxiety and depression 01/23/2016  . Status post tubal ligation 09/22/2015  . Morbid (severe) obesity due to excess calories ([place]) 04/25/2015  . Anemia, iron deficiency 01/04/2015  . Headache, migraine 01/04/2015  . Abnormal cytological findings in female genital organs 07/04/2011    Trotter,Margaret PT, DPT 05/05/2019, 1:41 PM  [place] MAIN [facility] SERVICES [place], [place], [address] Phone: [phone]   Fax:  [phone]  Name: DARIUS FILLINGIM MRN: [id number] Date of Birth: [birth year]

## 2019-05-06 ENCOUNTER — Encounter: Payer: Self-pay | Admitting: Neurology

## 2019-05-06 ENCOUNTER — Ambulatory Visit (INDEPENDENT_AMBULATORY_CARE_PROVIDER_SITE_OTHER): Payer: BC Managed Care – PPO | Admitting: Neurology

## 2019-05-06 ENCOUNTER — Other Ambulatory Visit: Payer: Self-pay

## 2019-05-06 VITALS — BP 123/85 | HR 76 | Temp 97.6°F | Ht 61.0 in | Wt 266.0 lb

## 2019-05-06 DIAGNOSIS — G43709 Chronic migraine without aura, not intractable, without status migrainosus: Secondary | ICD-10-CM

## 2019-05-06 DIAGNOSIS — G43109 Migraine with aura, not intractable, without status migrainosus: Secondary | ICD-10-CM

## 2019-05-06 MED ORDER — NURTEC 75 MG PO TBDP: 75.0000 mg | ORAL_TABLET | Freq: Every day | ORAL | 6 refills | Status: DC | PRN

## 2019-05-06 MED ORDER — AJOVY 225 MG/1.5ML ~~LOC~~ SOAJ: 225.0000 mg | SUBCUTANEOUS | 11 refills | Status: DC

## 2019-05-06 NOTE — Progress Notes (Signed)
[facility]    Provider:  Dr Jaynee Eagles Requesting Provider: Nobie Putnam * Primary Care Provider:  Olin Hauser, DO  CC:  Stroke  Interval history 05/06/2019: Repeat MRI of the brain did not show the possible ischemic event, we also completed it with MS protocol .Discussed migraines. Reviewed MRi brain images. Repeat MRI of the brain images in 6 months she will come in. Discussed MS vs Strokes or other etiologies. Does not appear to be MS but need to follow serially with MRI q67months. Started Ajovy and nurtec.  Discussed acute and chronic migraine management.  Tried topamax, propranolol, effexor   HPI:  Leah Osborne is a 33 y.o. female here as requested by Nobie Putnam * for Stroke. PMHx PTSD, obesity, HT, HLD, migraines, bipolar 1, migraine w/aura  She has chronic nausea and vomiting since June since bariatric surgery, she has lost 34 pounds, been dehydrated. She was discharged from [facility] for this, she went to [facility] and they were going to send her home and to follow up with surgery. She had double vision and unsteadiness. She went to the eye doctor, he thought it was Wernicke due to deficiency and MRI showed stroke. Maternal grandfather had a stroke when 18, maternal grandfather also had a stroke at the age of 35. She has no history of DVTs or miscarriages. Not on aspirin. She had a sleeve not a roux-en-y. Her balance is impaired and she can fall. 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 She has not had Physical Therapy. She lives in [place]. Double vision is better but not completely better. She is following with Ophthalmology for eye therapy. She has weakness int he right leg. Doble vision Started 23rd of July.  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 Otherwise negative.  Reviewed echocardiogram report:   IMPRESSIONS    1. The left ventricle has normal systolic function with an ejection fraction of 60-65%. The cavity size was normal. Left ventricular diastolic parameters were normal.  2. The right ventricle has normal systolic function. The cavity was normal. There is no increase in right ventricular wall thickness. Right ventricular systolic pressure is mildly elevated with an estimated pressure of 33.6 mmHg.  3. Rare PVCs  FINDINGS  Left Ventricle: The left ventricle has normal systolic function, with an ejection fraction of 60-65%. The cavity size was normal. There is no increase in left ventricular wall thickness. Left ventricular diastolic parameters were normal. Definity  contrast agent was given IV to delineate the left ventricular endocardial borders.  Right Ventricle: The right ventricle has normal systolic function. The cavity was normal. There is no increase in right ventricular wall thickness. Right ventricular systolic pressure is mildly elevated with an estimated pressure of 33.6 mmHg.  Left Atrium: Left atrial size was normal in size.  Right Atrium: Right atrial size was normal in size. Right atrial pressure is estimated at 10 mmHg.  Interatrial Septum: No atrial level shunt detected by color flow Doppler.  Pericardium: There is no evidence of pericardial effusion.  Mitral Valve: The mitral valve is normal in structure. Mitral valve regurgitation is not visualized by color flow Doppler.  Tricuspid Valve: The tricuspid valve is normal in structure. Tricuspid valve regurgitation was not visualized by color  flow Doppler.  Aortic Valve: The aortic valve is grossly normal Aortic valve regurgitation was not visualized by color flow Doppler.  Pulmonic Valve: The pulmonic valve was grossly normal. Pulmonic valve regurgitation is not  visualized by color flow Doppler.  Aorta: The aorta is normal in size and structure.  Venous: The inferior vena cava is normal in size with greater than 50% respiratory variability.  Carotid Dopplers: IMPRESSION: No significant carotid atherosclerosis. No hemodynamically significant ICA stenosis. Degree of narrowing less than 50% bilaterally by ultrasound criteria.  Review of Systems: Patient complains of symptoms per HPI as well as the following symptoms: diplopia, stroke. Pertinent negatives and positives per HPI. All others negative.   Social History   Socioeconomic History   Marital status: Married    Spouse name: Not on file   Number of children: Not on file   Years of education: Not on file   Highest education level: Not on file  Occupational History   Not on file  Social Needs   Financial resource strain: Not on file   Food insecurity    Worry: Not on file    Inability: Not on file   Transportation needs    Medical: Not on file    Non-medical: Not on file  Tobacco Use   Smoking status: Never Smoker   Smokeless tobacco: Never Used  Substance and Sexual Activity   Alcohol use: Not Currently    Alcohol/week: 0.0 standard drinks   Drug use: No   Sexual activity: Yes    Birth control/protection: Surgical    Comment: tubal  Lifestyle   Physical activity    Days per week: Not on file    Minutes per session: Not on file   Stress: Not on file  Relationships   Social connections    Talks on phone: Not on file    Gets together: Not on file    Attends religious service: Not on file    Active member of club or organization: Not on file    Attends meetings of clubs or organizations: Not on file    Relationship status: Not on file   Intimate partner violence    Fear of current or ex partner: Not on file    Emotionally abused: Not on file    Physically abused: Not on file    Forced sexual activity: Not on file  Other Topics Concern   Not on  file  Social History Narrative   Lives in [place] with husband   Right handed    Family History  Problem Relation Age of Onset   Diabetes Maternal Grandmother    Heart disease Maternal Grandmother    Heart failure Maternal Grandmother    Heart attack Maternal Grandmother    Rheum arthritis Mother    Healthy Father    Heart Problems Maternal Grandfather    Stroke Maternal Grandfather    Atrial fibrillation Paternal Grandmother    Stroke Other        mat great gf had stroke at 54   Cancer Neg Hx     Past Medical History:  Diagnosis Date   Abnormal Pap smear of cervix    Arthritis    Arthritis, rheumatoid (HCC)    Atypical chest pain    Bipolar 1 disorder ([place])    Headache    MIGRAINES   Hyperlipidemia    Hypertension    Morbid obesity ([place])    a. 11/2018 s/p Robotic Sleeve Gastrectomy @ [facility].   Palpitations  a. 09/2017 Zio Monitor: predominantly RSR, avg HR 87, 11 A tach/SVT episodes, fastest 176 x 7 beats, longest 9 beats. Rare PAC's, occas PVC's, rare couplets/triplets.   Persistent Nausea and vomiting    a. 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   Hypokalemia 01/01/2019   Elevated hemoglobin A1c 02/12/2018   Dyslipidemia 02/05/2018   Abnormal skin growth 11/15/2016   PTSD (post-traumatic stress disorder) 10/24/2016   Severe bipolar I disorder, most recent episode depressed without psychotic features ([place]) 10/23/2016   Ankylosing spondylitis ([place]) 10/23/2016   Sacroiliac inflammation  ([place]) 08/02/2016   Hypertension 05/28/2016   Vitamin D deficiency 05/02/2016   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   Hypothyroidism 02/13/2016   Anxiety and depression 01/23/2016   Status post tubal ligation 09/22/2015   Morbid (severe) obesity due to excess calories ([place]) 04/25/2015   Anemia, iron deficiency 01/04/2015   Headache, migraine 01/04/2015   Abnormal cytological findings in female genital organs 07/04/2011    Past Surgical History:  Procedure Laterality Date   CHOLECYSTECTOMY     LAPAROSCOPIC GASTRIC SLEEVE RESECTION  11/19/2018   LAPAROSCOPIC TUBAL LIGATION Bilateral 09/19/2015   Procedure: LAPAROSCOPIC BILATERAL TUBAL BANDING;  Surgeon: Brayton Mars, MD;  Location: [facility] ORS;  Service: Gynecology;  Laterality: Bilateral;    Current Outpatient Medications  Medication Sig Dispense Refill   ALPRAZolam (XANAX) 0.25 MG tablet Take 1-2 tabs (0.25mg -0.50mg ) 30-60 minutes before procedure. May repeat if needed.Do not drive. 4 tablet 0   aspirin EC 81 MG tablet Take 1 tablet (81 mg total) by mouth daily. (Patient taking differently: Take 325 mg by mouth daily. ) 150 tablet 2   atorvastatin (LIPITOR) 40 MG tablet Take 1 tablet (40 mg total) by mouth daily at 6 PM. 90 tablet 3   Elagolix Sodium (ORILISSA) 150 MG TABS Take by mouth.     esomeprazole (NEXIUM) 40 MG capsule Take 40 mg by mouth daily.      levothyroxine (SYNTHROID) 25 MCG tablet TAKE 1 TABLET DAILY BEFORE BREAKFAST 90 tablet 3   OLANZapine zydis (ZYPREXA) 5 MG disintegrating tablet Take 0.5 tablets (2.5 mg total) by mouth 2 (two) times a day. 14 tablet 0   spironolactone (ALDACTONE) 25 MG tablet Take 1 tablet (25 mg total) by mouth daily. 90 tablet 3   thiamine (VITAMIN B-1) 100 MG tablet Start 1 tab (100mg ) 3 times a day for 2 weeks, then reduce to 1 tab (100mg ) daily for maintenance 90 tablet 1   Fremanezumab-vfrm (AJOVY) 225 MG/1.5ML SOAJ Inject 225 mg into  the skin every 30 (thirty) days. 1 pen 11   Rimegepant Sulfate (NURTEC) 75 MG TBDP Take 75 mg by mouth daily as needed. For migraines. Take as close to onset of migraine as possible. One daily maximum. 10 tablet 6   No current facility-administered medications for this visit.     Allergies as of 05/06/2019   (No Known Allergies)    Vitals: BP 123/85 (BP Location: Right Arm, Patient Position: Sitting)    Pulse 76    Temp 97.6  F (36.4 C) Comment: taken at front   Ht 5\' 1"  (1.549 m)    Wt 266 lb (120.7 kg)    LMP  (LMP Unknown)    BMI 50.26 kg/m  Last Weight:  Wt Readings from Last 1 Encounters:  05/06/19 266 lb (120.7 kg)   Last Height:   Ht Readings from Last 1 Encounters:  05/06/19 5\' 1"  (1.549 m)     Physical exam: Exam: Gen: NAD, conversant, well nourised, obese, well groomed                     CV: RRR, no MRG. No Carotid Bruits. No peripheral edema, warm, nontender Eyes: Conjunctivae clear without exudates or hemorrhage  Neuro: Detailed Neurologic Exam  Speech:    Speech is normal; fluent and spontaneous with normal comprehension.  Cognition:    The patient is oriented to person, place, and time;     recent and remote memory intact;     language fluent;     normal attention, concentration,     fund of knowledge Cranial Nerves:    The pupils are equal, round, and reactive to light. The fundi are normal and spontaneous venous pulsations are present. Visual fields are full to finger confrontation. Extraocular movements are intact. Trigeminal sensation is intact and the muscles of mastication are normal. The face is symmetric. The palate elevates in the midline. Hearing intact. Voice is normal. Shoulder shrug is normal. The tongue has normal motion without fasciculations.   Coordination:    Normal finger to nose and heel to shin. Normal rapid alternating movements.   Gait:    Heel-toe and tandem gait are normal.   Motor Observation:    No asymmetry, no atrophy,  and no involuntary movements noted. Tone:    Normal muscle tone.    Posture:    Posture is normal. normal erect    Strength:    Strength is V/V in the upper and lower limbs.      Sensation: intact to LT     Reflex Exam:  DTR's:    Deep tendon reflexes in the upper and lower extremities are normal bilaterally.   Toes:    The toes are downgoing bilaterally.   Clonus:    Clonus is absent.    Assessment/Plan:  33 y.o. female here as requested by Nobie Putnam * for Stroke. PMHx PTSD, obesity, HT, HLD, migraines, bipolar 1, migraine w/aura  She has chronic nausea and vomiting since June since bariatric surgery, she has lost 34 pounds, been dehydrated. She was discharged from [facility] for this, she went to [facility] and they were going to send her home and to follow up with surgery. She had double vision and unsteadiness. She went to the eye doctor, he thought it was Wernicke due to deficiency and MRI showed stroke  splenium of corpus callosum.  Repeat MRI shows resolution of the lesion.  Multiple sclerosis protocol not consistent with multiple sclerosis.  We will repeat imaging in 6 months.  She also has chronic migraines, has failed multiple medications, we discussed this, start Ajovy.  Nurtec for acute management.  Meds ordered this encounter  Medications   Fremanezumab-vfrm (AJOVY) 225 MG/1.5ML SOAJ    Sig: Inject 225 mg into the skin every 30 (thirty) days.    Dispense:  1 pen    Refill:  11    Patient has copay card; she can have medication regardless of insurance approval or copay amount.   Rimegepant Sulfate (NURTEC)  75 MG TBDP    Sig: Take 75 mg by mouth daily as needed. For migraines. Take as close to onset of migraine as possible. One daily maximum.    Dispense:  10 tablet    Refill:  6    Patient has copay card; she can have medication for $5 regardless of insurance approval or copay amount.     Young patient with stroke. Risk factors include obesity, migraine with  aura, dehydration (chronic nausea and vomiting since sleeve for obesity), vitamin deficiencies, r/o hypercoag and pfo or other embolic etiology   - Hypercoag panel unremarkable - Was not on asa prior, on ASA 81mg  now - LDL 70 - TCD -completed with Dr. Leonie Man and had a second opinion with Dr. Leonie Man stroke and vascular specialist. - HgbA1c pending - ECHO without pfo or clot. -Triptans contraindicated due to history of possible stroke - Discussed: There is increased risk for stroke in women with migraine with aura and a contraindication for the combined contraceptive pill for use by women who have migraine with aura. The risk for women with migraine without aura is lower. However other risk factors like smoking are far more likely to increase stroke risk than migraine. There is a recommendation for no smoking and for the use of OCPs without estrogen such as progestogen only pills particularly for women with migraine with aura.Marland Kitchen People who have migraine headaches with auras may be 3 times more likely to have a stroke caused by a blood clot, compared to migraine patients who don't see auras. Women who take hormone-replacement therapy may be 30 percent more likely to suffer a clot-based stroke than women not taking medication containing estrogen. Other risk factors like smoking and high blood pressure may be  much more important - I had a long d/w patient about her stroke, risk for recurrent stroke/TIAs, personally independently reviewed imaging studies and stroke evaluation results and answered questions.Continue ASA for secondary stroke prevention and maintain strict control of hypertension with blood pressure goal below 130/90, diabetes with hemoglobin A1c goal below 6.5% and lipids with LDL cholesterol goal below 70 mg/dL. I also advised the patient to eat a healthy diet with plenty of whole grains, cereals, fruits and vegetables, exercise regularly and maintain ideal body weight .Followup with Dr. Leonie Man for  second opinion then can follow up with me or Janett Billow in the future - needs close f/u with bariatric surgeon for management of vitamin deficiencies and chronic nausea, continue supplement- follow with ophthalmologys  A total of 40 minutes was spent face-to-face with this patient. Over half this time was spent on counseling patient on the No diagnosis found. diagnosis and different diagnostic and therapeutic options, counseling and coordination of care, risks ans benefits of management, compliance, or risk factor reduction and education.      Cc: Lujean Amel, MD  [facility] [address] [place], [place] [address]  Phone [phone] Fax [phone]

## 2019-05-06 NOTE — Patient Instructions (Signed)
  Fremanezumab injection What is this medicine? FREMANEZUMAB (fre ma NEZ ue mab) is used to prevent migraine headaches. This medicine may be used for other purposes; ask your health care provider or pharmacist if you have questions. COMMON BRAND NAME(S): AJOVY What should I tell my health care provider before I take this medicine? They need to know if you have any of these conditions:  an unusual or allergic reaction to fremanezumab, other medicines, foods, dyes, or preservatives  pregnant or trying to get pregnant  breast-feeding How should I use this medicine? This medicine is for injection under the skin. You will be taught how to prepare and give this medicine. Use exactly as directed. Take your medicine at regular intervals. Do not take your medicine more often than directed. It is important that you put your used needles and syringes in a special sharps container. Do not put them in a trash can. If you do not have a sharps container, call your pharmacist or healthcare provider to get one. Talk to your pediatrician regarding the use of this medicine in children. Special care may be needed. Overdosage: If you think you have taken too much of this medicine contact a poison control center or emergency room at once. NOTE: This medicine is only for you. Do not share this medicine with others. What if I miss a dose? If you miss a dose, take it as soon as you can. If it is almost time for your next dose, take only that dose. Do not take double or extra doses. What may interact with this medicine? Interactions are not expected. This list may not describe all possible interactions. Give your health care provider a list of all the medicines, herbs, non-prescription drugs, or dietary supplements you use. Also tell them if you smoke, drink alcohol, or use illegal drugs. Some items may interact with your medicine. What should I watch for while using this medicine? Tell your doctor or healthcare  professional if your symptoms do not start to get better or if they get worse. What side effects may I notice from receiving this medicine? Side effects that you should report to your doctor or health care professional as soon as possible:  allergic reactions like skin rash, itching or hives, swelling of the face, lips, or tongue Side effects that usually do not require medical attention (report these to your doctor or health care professional if they continue or are bothersome):  pain, redness, or irritation at site where injected This list may not describe all possible side effects. Call your doctor for medical advice about side effects. You may report side effects to FDA at 1-800-FDA-1088. Where should I keep my medicine? Keep out of the reach of children. You will be instructed on how to store this medicine. Throw away any unused medicine after the expiration date on the label. NOTE: This sheet is a summary. It may not cover all possible information. If you have questions about this medicine, talk to your doctor, pharmacist, or health care provider.  2020 Elsevier/Gold Standard (2017-03-04 17:22:56)  

## 2019-05-07 ENCOUNTER — Encounter: Payer: BC Managed Care – PPO | Admitting: Occupational Therapy

## 2019-05-12 ENCOUNTER — Encounter: Payer: BC Managed Care – PPO | Admitting: Occupational Therapy

## 2019-05-12 ENCOUNTER — Ambulatory Visit: Payer: BC Managed Care – PPO | Admitting: Physical Therapy

## 2019-05-18 ENCOUNTER — Encounter: Payer: BC Managed Care – PPO | Admitting: Occupational Therapy

## 2019-05-18 ENCOUNTER — Ambulatory Visit: Payer: BC Managed Care – PPO | Admitting: Physical Therapy

## 2019-05-19 ENCOUNTER — Telehealth: Admit: 2019-05-19 | Discharge: 2019-05-20 | Payer: MEDICAID

## 2019-05-19 NOTE — Unmapped (Signed)
HISTORY: Jacqueline Hall is a 33 y.o. female with low back pain and possible ankylosing spondylitis.   She was evaluated by Dr. Sullivan Lone in 2018 at which time she had an MRI which was concerning for SI sclerosis, question of previous active sacroiliitis with now residual degenerative changes. Repeat SI joint MRI 12/2017 showed no active sacroiliitis.   Ultimately started trial of humira in Spring 2020 due to persistent sx consistent with spondyloarthritis and has noted improvement with this.    Additional hx of bariatric surgery 11/2018. Then hospitalized in 12/2018 for N/V, dizziness, tinnitus, vision changes. Given fluid resuscitation. Was to f/u with neurology as an outpatient for dizziness, tinnitus, vision changes. Humira stopped for surgery in 11/2018, then had not been resumed at f/u in 01/2019 due to other possible neurologic issues.     Interim history:  Pt did not log in at time of visit. Called 1431 to see if she was having difficulty logging in. No answer, unable to LM.   Called again at 1436, no answer, unable to LM.   Called again at 1445, no answer, unable to LM.

## 2019-05-20 ENCOUNTER — Encounter: Payer: BC Managed Care – PPO | Admitting: Occupational Therapy

## 2019-05-25 ENCOUNTER — Encounter: Payer: BC Managed Care – PPO | Admitting: Occupational Therapy

## 2019-05-25 ENCOUNTER — Ambulatory Visit: Payer: BC Managed Care – PPO | Admitting: Physical Therapy

## 2019-05-28 ENCOUNTER — Encounter: Payer: BC Managed Care – PPO | Admitting: Occupational Therapy

## 2019-06-02 ENCOUNTER — Ambulatory Visit: Payer: BC Managed Care – PPO | Admitting: Physical Therapy

## 2019-06-02 ENCOUNTER — Encounter: Payer: BC Managed Care – PPO | Admitting: Occupational Therapy

## 2019-06-04 ENCOUNTER — Encounter: Payer: BC Managed Care – PPO | Admitting: Occupational Therapy

## 2019-06-08 ENCOUNTER — Encounter: Payer: BC Managed Care – PPO | Admitting: Occupational Therapy

## 2019-06-08 ENCOUNTER — Ambulatory Visit: Payer: BC Managed Care – PPO | Admitting: Physical Therapy

## 2019-06-10 ENCOUNTER — Encounter: Payer: BC Managed Care – PPO | Admitting: Occupational Therapy

## 2019-06-15 ENCOUNTER — Ambulatory Visit: Payer: BC Managed Care – PPO | Admitting: Physical Therapy

## 2019-06-15 ENCOUNTER — Encounter: Payer: BC Managed Care – PPO | Admitting: Occupational Therapy

## 2019-06-17 ENCOUNTER — Encounter: Payer: BC Managed Care – PPO | Admitting: Occupational Therapy

## 2019-06-17 DIAGNOSIS — H4902 Third [oculomotor] nerve palsy, left eye: Secondary | ICD-10-CM | POA: Diagnosis not present

## 2019-06-22 ENCOUNTER — Other Ambulatory Visit: Payer: Self-pay

## 2019-06-24 DIAGNOSIS — F3181 Bipolar II disorder: Secondary | ICD-10-CM | POA: Diagnosis not present

## 2019-06-25 NOTE — Unmapped (Signed)
Addended by: Lurlean Nanny on: 06/25/2019 02:33 PM     Modules accepted: Level of Service

## 2019-07-01 ENCOUNTER — Ambulatory Visit: Payer: BC Managed Care – PPO | Attending: Internal Medicine

## 2019-07-01 DIAGNOSIS — Z20822 Contact with and (suspected) exposure to covid-19: Secondary | ICD-10-CM

## 2019-07-02 LAB — NOVEL CORONAVIRUS, NAA: SARS-CoV-2, NAA: NOT DETECTED

## 2019-07-15 ENCOUNTER — Other Ambulatory Visit: Payer: Self-pay | Admitting: Nurse Practitioner

## 2019-07-15 DIAGNOSIS — I6930 Unspecified sequelae of cerebral infarction: Secondary | ICD-10-CM

## 2019-07-15 DIAGNOSIS — E785 Hyperlipidemia, unspecified: Secondary | ICD-10-CM

## 2019-07-31 ENCOUNTER — Other Ambulatory Visit: Payer: Self-pay | Admitting: Nurse Practitioner

## 2019-07-31 DIAGNOSIS — I6930 Unspecified sequelae of cerebral infarction: Secondary | ICD-10-CM

## 2019-07-31 DIAGNOSIS — E785 Hyperlipidemia, unspecified: Secondary | ICD-10-CM

## 2019-08-31 ENCOUNTER — Telehealth: Payer: Self-pay

## 2019-08-31 MED ORDER — ORILISSA 150 MG PO TABS: 150.0000 mg | ORAL_TABLET | Freq: Every day | ORAL | 12 refills | Status: DC

## 2019-09-01 ENCOUNTER — Other Ambulatory Visit: Payer: Self-pay

## 2019-09-01 DIAGNOSIS — E039 Hypothyroidism, unspecified: Secondary | ICD-10-CM

## 2019-09-01 MED ORDER — ORILISSA 150 MG PO TABS: 150.0000 mg | ORAL_TABLET | Freq: Every day | ORAL | 12 refills | Status: DC

## 2019-09-01 MED ORDER — LEVOTHYROXINE SODIUM 25 MCG PO TABS: 25.0000 ug | ORAL_TABLET | Freq: Every day | ORAL | 3 refills | Status: DC

## 2019-09-01 MED ORDER — THIAMINE HCL 100 MG PO TABS: 100.0000 mg | ORAL_TABLET | Freq: Every day | ORAL | 3 refills | Status: DC

## 2019-09-02 ENCOUNTER — Other Ambulatory Visit: Payer: Self-pay | Admitting: *Deleted

## 2019-09-02 DIAGNOSIS — I6930 Unspecified sequelae of cerebral infarction: Secondary | ICD-10-CM

## 2019-09-02 DIAGNOSIS — E785 Hyperlipidemia, unspecified: Secondary | ICD-10-CM

## 2019-09-02 MED ORDER — ASPIRIN EC 81 MG PO TBEC: 81.0000 mg | DELAYED_RELEASE_TABLET | Freq: Every day | ORAL | 0 refills | Status: DC

## 2019-09-02 MED ORDER — ATORVASTATIN CALCIUM 40 MG PO TABS: 40.0000 mg | ORAL_TABLET | Freq: Every day | ORAL | 2 refills | Status: DC

## 2019-09-04 ENCOUNTER — Telehealth: Payer: Self-pay

## 2019-09-04 NOTE — Telephone Encounter (Signed)
Pt called and is aware that the office received a denial letter for her Freida Busman. Pt was advised that we had samples at the office for pick up to cover her until she is covered.

## 2019-09-22 ENCOUNTER — Telehealth: Payer: Self-pay

## 2019-09-22 NOTE — Telephone Encounter (Signed)
Called [place] repo concerning pt being denied and what other steps could I do to get the pt approved. Was suggested by Arbie Cookey to do an [place] complete from and faxed it into the office for review and she would look into it.

## 2019-09-24 ENCOUNTER — Ambulatory Visit: Payer: Medicaid Other | Attending: Internal Medicine

## 2019-09-24 DIAGNOSIS — Z20822 Contact with and (suspected) exposure to covid-19: Secondary | ICD-10-CM

## 2019-09-25 LAB — SARS-COV-2, NAA 2 DAY TAT

## 2019-09-25 LAB — NOVEL CORONAVIRUS, NAA: SARS-CoV-2, NAA: NOT DETECTED

## 2019-10-01 ENCOUNTER — Encounter: Payer: Self-pay | Admitting: *Deleted

## 2019-10-01 NOTE — Telephone Encounter (Signed)
I called the pt & LVM (ok per DPR) asking per Dr. Jaynee Eagles if the pt could let us know if she is ok with Dr. Jaynee Eagles ordering another MRI brain to check for the lesion that had "disappeared" previously. Left office number in message and also advised I would be sending a mychart message that she could respond to if preferred.

## 2019-10-02 ENCOUNTER — Other Ambulatory Visit: Payer: Self-pay | Admitting: Neurology

## 2019-10-02 DIAGNOSIS — G371 Central demyelination of corpus callosum: Secondary | ICD-10-CM

## 2019-10-02 NOTE — Progress Notes (Signed)
Mri brain  

## 2019-10-07 ENCOUNTER — Other Ambulatory Visit: Payer: Self-pay | Admitting: Neurology

## 2019-10-07 ENCOUNTER — Telehealth: Payer: Self-pay | Admitting: Neurology

## 2019-10-07 ENCOUNTER — Telehealth: Payer: Self-pay | Admitting: Obstetrics and Gynecology

## 2019-10-07 MED ORDER — ALPRAZOLAM 0.25 MG PO TABS: ORAL_TABLET | ORAL | 0 refills | Status: DC

## 2019-10-07 NOTE — Telephone Encounter (Signed)
Arbie Cookey from [place] called and stated that she wanted the help you with this pts meds. [id number]

## 2019-10-07 NOTE — Telephone Encounter (Signed)
no to the covid questions MR Brain w/wo contrast Dr. Daryel November Auth: [id number] (exp. 10/06/19 to 01/04/20). Patient is scheduled at [facility] for 12/16/19.   She also stated she is claustrophic and would like something to help her. She is aware to have a driver.. she also wants to know would she need to stop taking the Buspirone her anxiety medicine to take the xanax?

## 2019-10-08 NOTE — Telephone Encounter (Signed)
Spoke to [place] concerning the pt's medication. Was informed that the pt had been denied and her insurance needed a few more information concerning the medication to see if it would be covered. Arbie Cookey stated that the information would be faxed to the office. New to [place] was given to me by Arbie Cookey. Will call and check on the status of the forms and information.

## 2019-10-09 NOTE — Telephone Encounter (Signed)
Spoke to repo about the status of the pt's medication. Was informed that a appeal had to be done and the information would be sent over to the office via fax. Once completed it needed to be faxed back in.

## 2019-10-13 NOTE — Progress Notes (Signed)
Pt present for follow up for endometriosis and medication follow for [place]. Pt stated that she was doing well and Junious Silk is working well for her.

## 2019-10-14 ENCOUNTER — Encounter: Payer: Self-pay | Admitting: Obstetrics and Gynecology

## 2019-10-14 ENCOUNTER — Other Ambulatory Visit: Payer: Self-pay

## 2019-10-14 ENCOUNTER — Ambulatory Visit (INDEPENDENT_AMBULATORY_CARE_PROVIDER_SITE_OTHER): Payer: Managed Care, Other (non HMO) | Admitting: Obstetrics and Gynecology

## 2019-10-14 VITALS — BP 107/75 | HR 102 | Ht 61.0 in | Wt 257.5 lb

## 2019-10-14 DIAGNOSIS — Z8673 Personal history of transient ischemic attack (TIA), and cerebral infarction without residual deficits: Secondary | ICD-10-CM | POA: Diagnosis not present

## 2019-10-14 DIAGNOSIS — N808 Other endometriosis: Secondary | ICD-10-CM | POA: Diagnosis not present

## 2019-10-14 DIAGNOSIS — Z79899 Other long term (current) drug therapy: Secondary | ICD-10-CM

## 2019-10-14 NOTE — Progress Notes (Signed)
    GYNECOLOGY PROGRESS NOTE  Subjective:    Patient ID: Leah Osborne, female    DOB: 1985-07-29, 34 y.o.   MRN: [id number]  HPI  Patient is a 34 y.o. [id number] female who presents for 6 month follow up of endometriosis, managed on Orilissa.  Patient reports that she had to stop the [place] in October secondary to having 2 mini-strokes.  Recently attempted to resume [place] in February, however now notes that her new insurance will not cover the medication.   She reports that her bleeding has returned (previously amenorrheic on Orilissa), but got a little better with her last cycle.  Is anticipating returning to amenorrhea.   Of note, patient reports that she had bariatric surgery in June of last year.  Has lost ~ 70 lbs so far.   The following portions of the patient's history were reviewed and updated as appropriate: allergies, current medications, past family history, past medical history, past social history, past surgical history and problem list.  Review of Systems Pertinent items noted in HPI and remainder of comprehensive ROS otherwise negative.   Objective:   Blood pressure 107/75, pulse (!) 102, height 5\' 1"  (1.549 m), weight 257 lb 8 oz (116.8 kg). General appearance: alert and no distress Remainder of exam deferred.   Assessment:   Medication check  Endometriosis History of stroke  Plan:   Patient for f/u of medication, has had an involuntary hiatus off the medication due to mini-stroke. Notes that Neurologist noted that she could resume her medications. Has been off for 4 months, but resumed 2 months ago.  Needs new prior authorization for medication as she has new insurance and the medication is no longer covered. Will f/u in 4-6 months, will need to recheck liver enzymes on medication.

## 2019-10-16 NOTE — Unmapped (Signed)
Called patient as she is scheduled to see me in clinic but had not arrived. Called twice, initially left message that I was trying to reach her as scheduled for appt today but had not arrived and wanted to offer video appt and would try again later.  Second call left message that I had been unable to reach her and I recommend she call clinic to reschedule appointment.     Danella Maiers, MD, MSCI  October 16, 2019   2:01 PM

## 2019-10-16 NOTE — Unmapped (Unsigned)
Called patient as she is scheduled to see me in clinic but had not arrived. Called twice, initially left message that I was trying to reach her as scheduled for appt today but had not arrived and wanted to offer video appt and would try again later.  Second call left message that I had been unable to reach her and I recommend she call clinic to reschedule appointment.     Danella Maiers, MD, MSCI  October 16, 2019   1:49 PM

## 2019-10-26 ENCOUNTER — Other Ambulatory Visit: Payer: Self-pay

## 2019-10-26 MED ORDER — ORILISSA 150 MG PO TABS: 150.0000 mg | ORAL_TABLET | Freq: Every day | ORAL | 12 refills | Status: DC

## 2019-11-04 ENCOUNTER — Ambulatory Visit: Payer: BC Managed Care – PPO | Admitting: Neurology

## 2019-12-08 ENCOUNTER — Other Ambulatory Visit: Payer: Self-pay

## 2019-12-08 MED ORDER — SPIRONOLACTONE 25 MG PO TABS: 25.0000 mg | ORAL_TABLET | Freq: Every day | ORAL | 3 refills | Status: DC

## 2019-12-16 ENCOUNTER — Other Ambulatory Visit: Payer: Managed Care, Other (non HMO)

## 2019-12-23 ENCOUNTER — Ambulatory Visit: Payer: Self-pay | Admitting: Neurology

## 2020-02-05 NOTE — Telephone Encounter (Signed)
error 

## 2020-02-08 ENCOUNTER — Telehealth (INDEPENDENT_AMBULATORY_CARE_PROVIDER_SITE_OTHER): Payer: Managed Care, Other (non HMO) | Admitting: Family Medicine

## 2020-02-08 ENCOUNTER — Other Ambulatory Visit: Payer: Self-pay

## 2020-02-08 DIAGNOSIS — Z5321 Procedure and treatment not carried out due to patient leaving prior to being seen by health care provider: Secondary | ICD-10-CM

## 2020-02-08 NOTE — Progress Notes (Signed)
Patient did not check in to mychart for video visit and did not answer phone when called at time of her appointment. No show virtual apt today  Leah Osborne, [facility] 02/08/2020, 5:45 PM

## 2020-02-16 ENCOUNTER — Encounter: Payer: Managed Care, Other (non HMO) | Admitting: Obstetrics and Gynecology

## 2020-03-04 ENCOUNTER — Other Ambulatory Visit: Payer: Self-pay

## 2020-03-04 MED ORDER — SPIRONOLACTONE 25 MG PO TABS: 25.0000 mg | ORAL_TABLET | Freq: Every day | ORAL | 3 refills | Status: DC

## 2020-03-18 ENCOUNTER — Encounter: Payer: Managed Care, Other (non HMO) | Admitting: Obstetrics and Gynecology

## 2020-03-23 ENCOUNTER — Encounter: Payer: Managed Care, Other (non HMO) | Admitting: Obstetrics and Gynecology

## 2020-03-28 ENCOUNTER — Encounter: Payer: Self-pay | Admitting: Obstetrics and Gynecology

## 2020-06-28 MED ORDER — MIRTAZAPINE 15 MG TABLET
ORAL_TABLET | 1 refills | 0 days
Start: 2020-06-28 — End: ?

## 2020-06-29 ENCOUNTER — Other Ambulatory Visit: Payer: Self-pay | Admitting: Neurology

## 2020-06-29 DIAGNOSIS — I6930 Unspecified sequelae of cerebral infarction: Secondary | ICD-10-CM

## 2020-06-29 DIAGNOSIS — E785 Hyperlipidemia, unspecified: Secondary | ICD-10-CM

## 2020-07-13 IMAGING — MR MRI HEAD WITHOUT CONTRAST
11 series · 47 of 48 positions shown · non-contrast
Comparison: None.

CLINICAL DATA: Diplopia.  Double vision 8 days.

EXAM:
MRI HEAD WITHOUT CONTRAST
TECHNIQUE: Multiplanar, multiecho pulse sequences of the brain and surrounding
structures were obtained without intravenous contrast.

[Series 5: ax dwi_tracew · axial · 3.0mm · 0.60mm/px · z∈[-91,+63]mm · 3 of 48 slices shown]
[im 1/48]
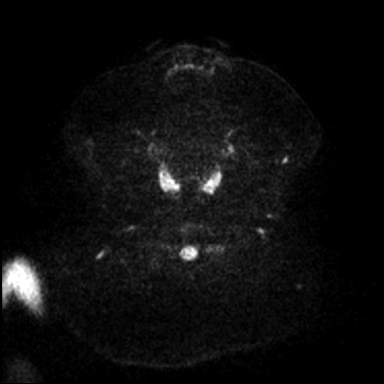
[im 24/48]
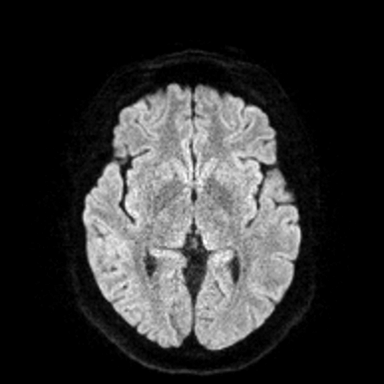
[im 48/48]
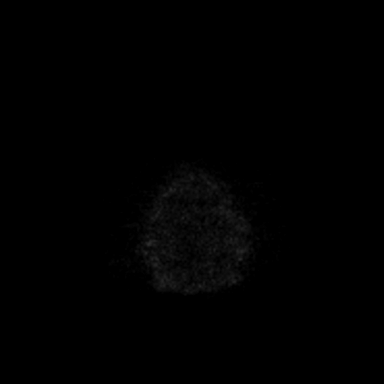

[Series 6: ax dwi_adc · axial · 3.0mm · 0.60mm/px · z∈[-91,+63]mm · 4 of 43 slices shown]
[im 1/43]
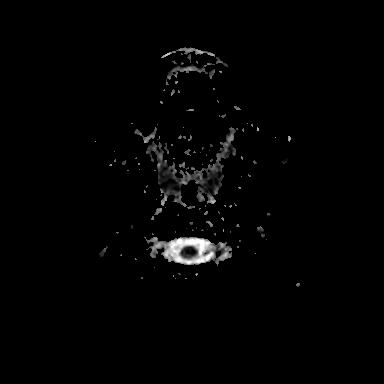
[im 15/43]
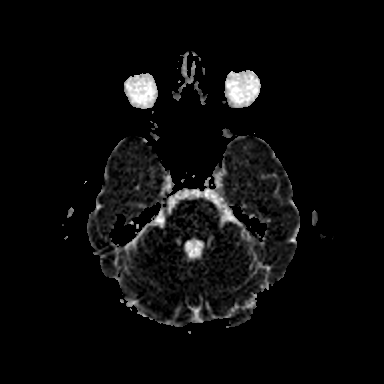
[im 29/43]
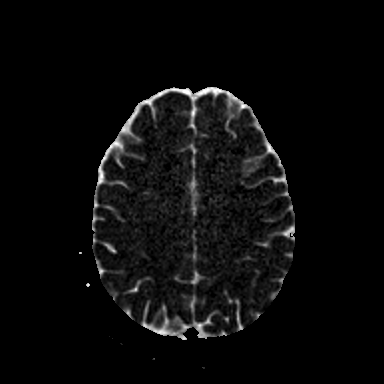
[im 43/43]
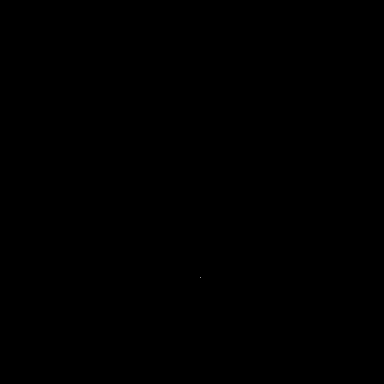

[Series 7: cor dwi_tracew · coronal · 5.0mm · 0.60mm/px · 3 of 36 slices shown]
[im 1/36]
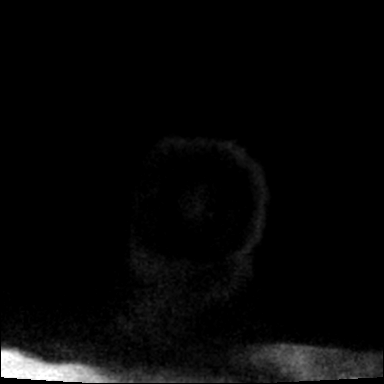
[im 18/36]
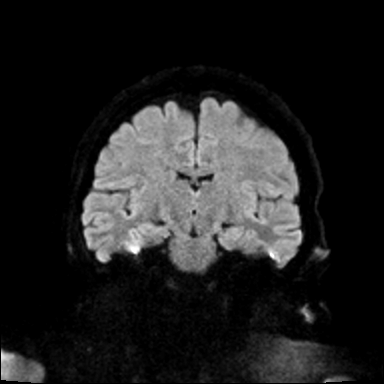
[im 36/36]
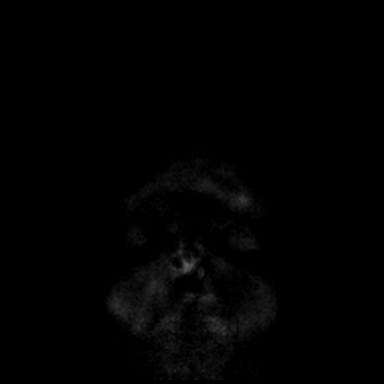

[Series 8: cor dwi_adc · coronal · 5.0mm · 0.60mm/px · 2 of 29 slices shown]
[im 1/29]
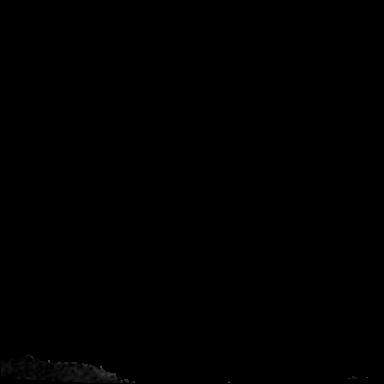
[im 29/29]
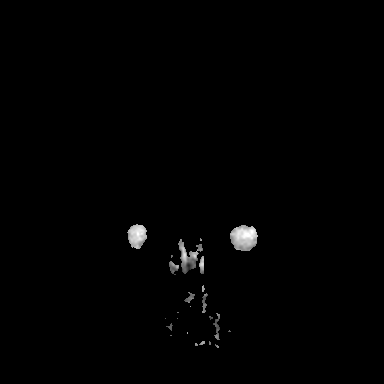

[Series 9: FLAIR · axial · 3.0mm · 0.53mm/px · z∈[-95,+65]mm · 5 of 55 slices shown]
[im 1/55]
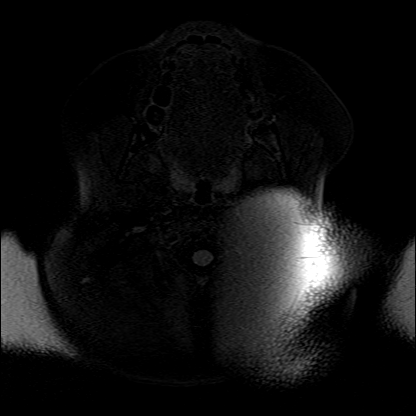
[im 14/55]
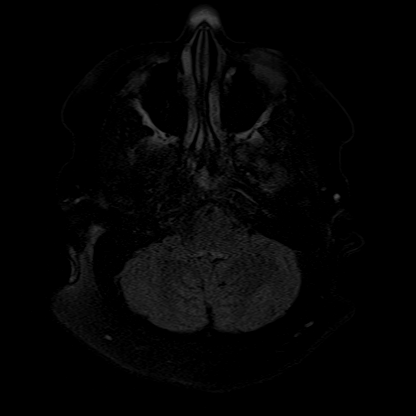
[im 28/55]
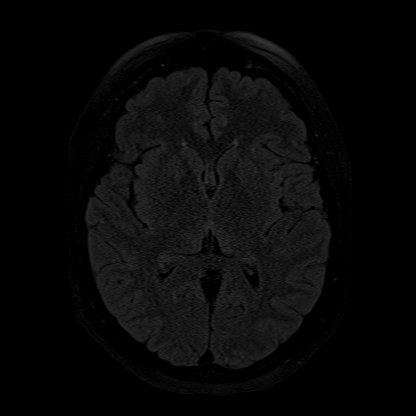
[im 41/55]
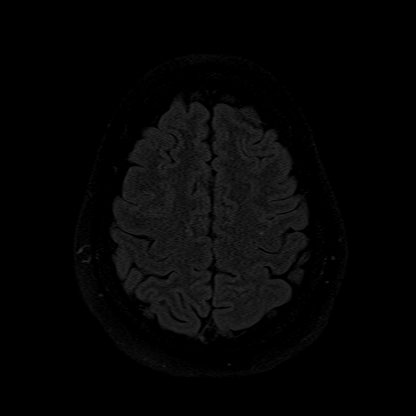
[im 55/55]
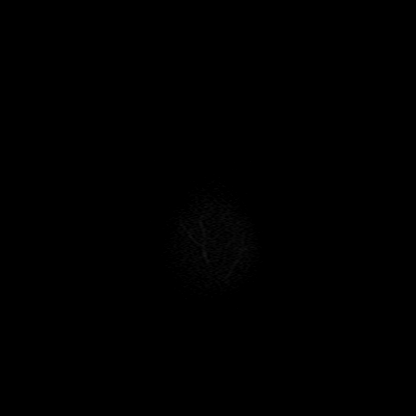

[Series 11: pha_images · axial · 3.0mm · 0.90mm/px · z∈[-100,+61]mm · 5 of 56 slices shown]
[im 1/56]
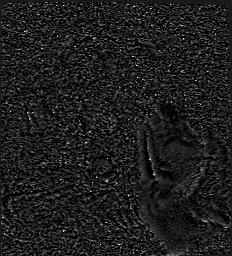
[im 14/56]
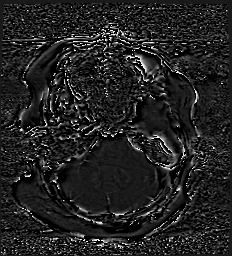
[im 28/56]
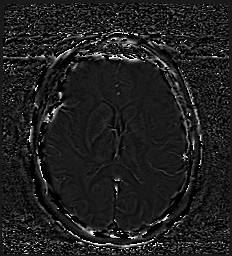
[im 42/56]
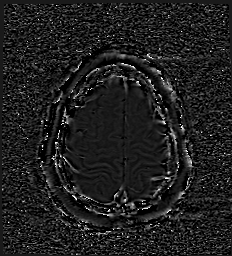
[im 56/56]
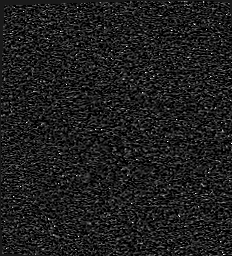

[Series 12: swi_images · axial · 3.0mm · 0.90mm/px · z∈[-100,+61]mm · 5 of 56 slices shown]
[im 1/56]
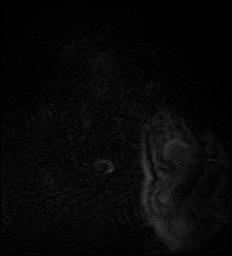
[im 14/56]
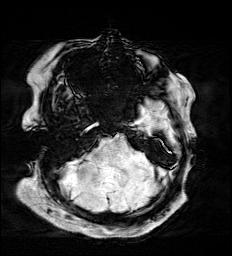
[im 28/56]
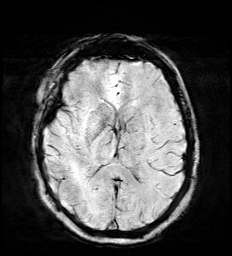
[im 42/56]
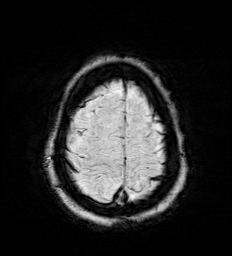
[im 56/56]
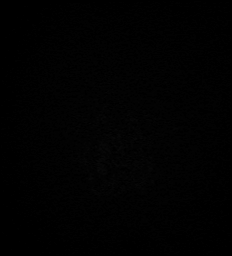

[Series 14: T2 · axial · 5.0mm · 0.53mm/px · z∈[-92,+62]mm · 2 of 27 slices shown (1 of 2)]
[im 1/27]
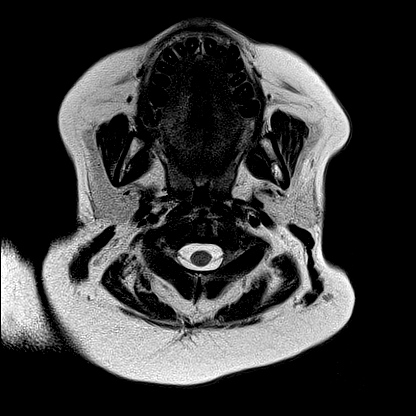
[im 27/27]
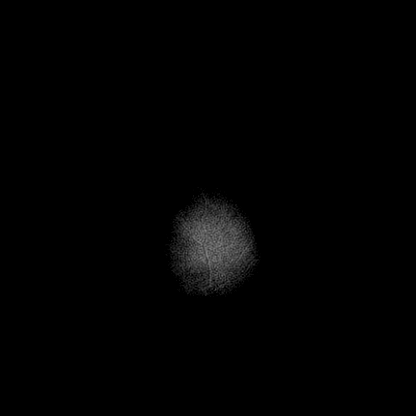

[Series 15: T1 · sagittal · 5.0mm · 0.94mm/px · 2 of 21 slices shown (1 of 2)]
[im 1/21]
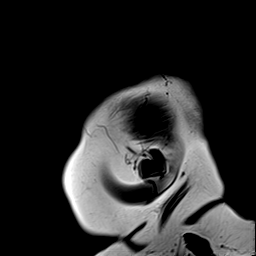
[im 21/21]
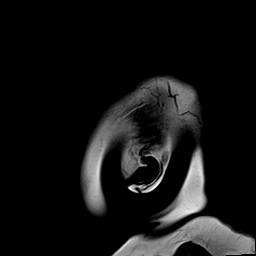

[Series 16: T1 · axial · 1.0mm · 0.98mm/px · z∈[-96,+78]mm · 14 of 175 slices shown (2 of 2)]
[im 1/175]
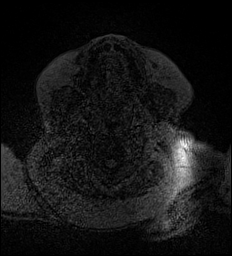
[im 13/175]
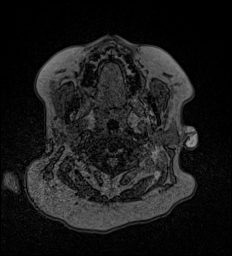
[im 25/175]
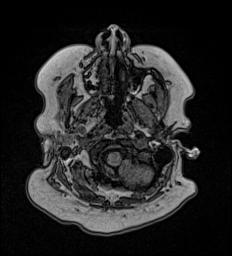
[im 38/175]
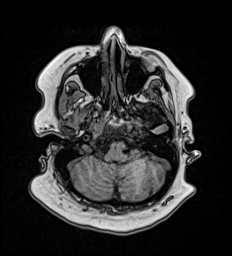
[im 50/175]
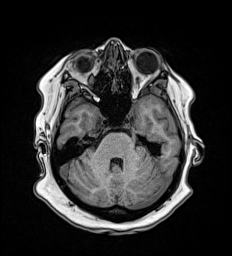
[im 63/175]
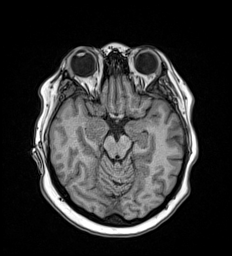
[im 75/175]
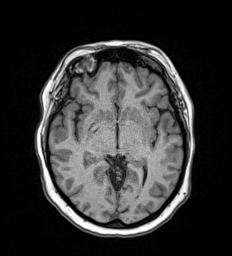
[im 88/175]
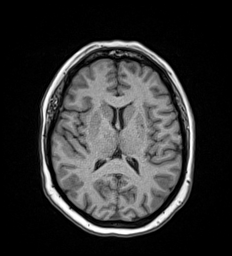
[im 100/175]
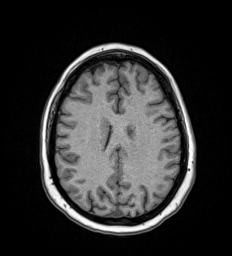
[im 112/175]
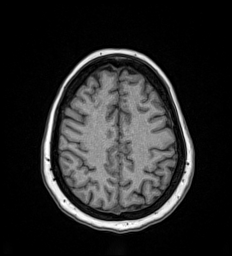
[im 125/175]
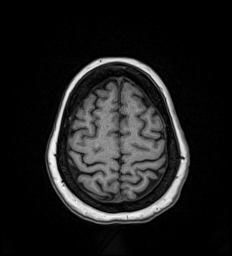
[im 137/175]
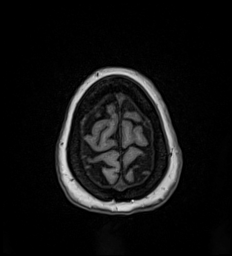
[im 150/175]
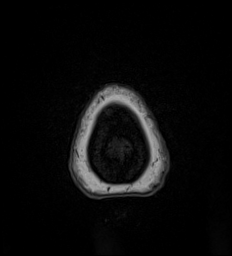
[im 175/175]
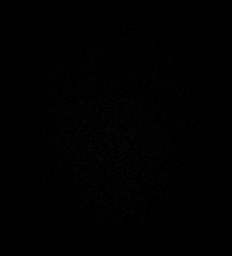

[Series 17: T2 · coronal · 5.0mm · 0.57mm/px · 2 of 29 slices shown (2 of 2)]
[im 1/29]
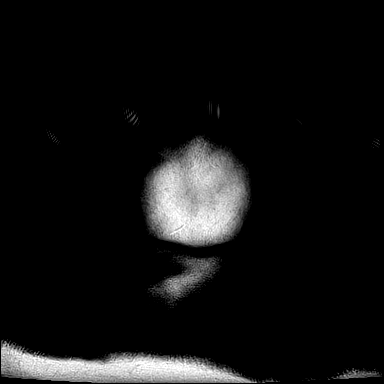
[im 29/29]
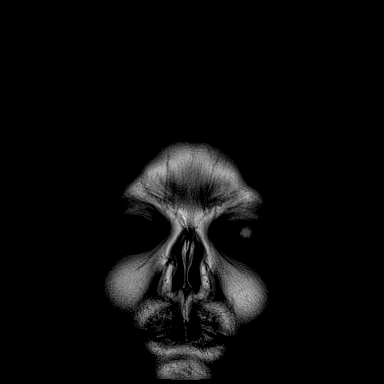

[47 of 48 positions shown; findings below may reference images not displayed]

FINDINGS: Brain: Small area of diffusion weighted hyperintensity in the
splenium of the corpus callosum. This shows low signal ADC
consistent with restricted diffusion. This may be a subacute
infarction. No other areas of restricted diffusion.

Ventricle size normal. No other areas of infarction. Negative for
hemorrhage or mass. No midline shift.

Vascular: Normal arterial flow voids.

Skull and upper cervical spine: Negative

Sinuses/Orbits: Negative

Other: None
IMPRESSION: Small focus of restricted diffusion splenium of corpus callosum most
consistent with acute or subacute infarction. Otherwise negative.

## 2020-07-14 IMAGING — US BILATERAL CAROTID DUPLEX ULTRASOUND
1 series · 13 of 24 positions shown · non-contrast
Comparison: 01/15/2019

CLINICAL DATA: Double vision, small acute stroke

EXAM:
BILATERAL CAROTID DUPLEX ULTRASOUND
TECHNIQUE: Gray scale imaging, color Doppler and duplex ultrasound were
performed of bilateral carotid and vertebral arteries in the neck.

[Series 1: bilateral carotid duplex ultrasound · 13 of 66 slices shown]
[im 1/66]
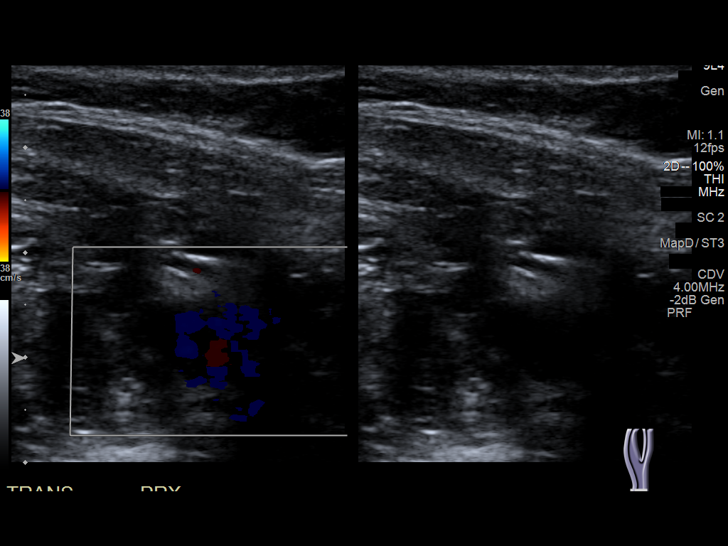
[im 6/66]
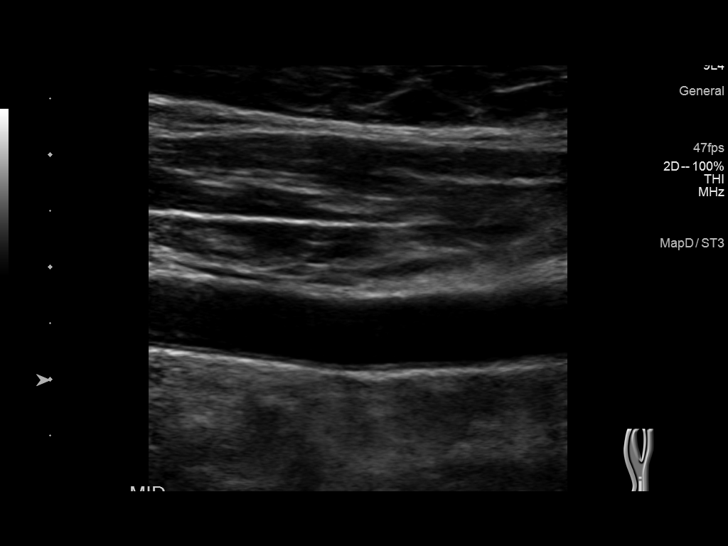
[im 12/66]
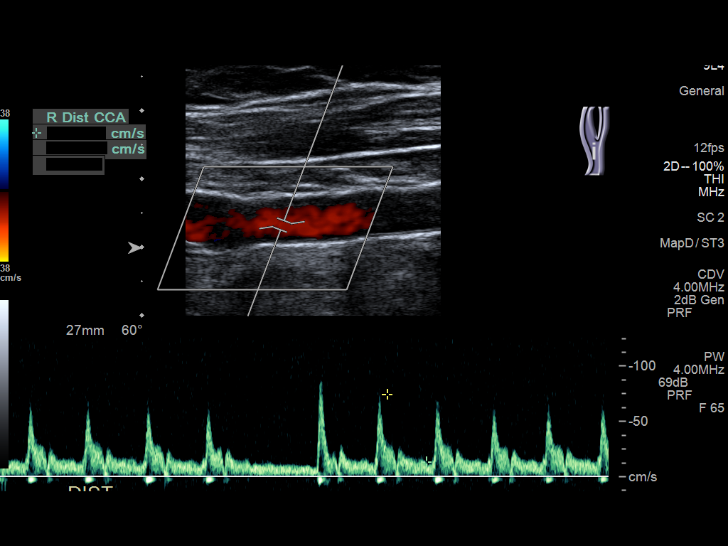
[im 17/66]
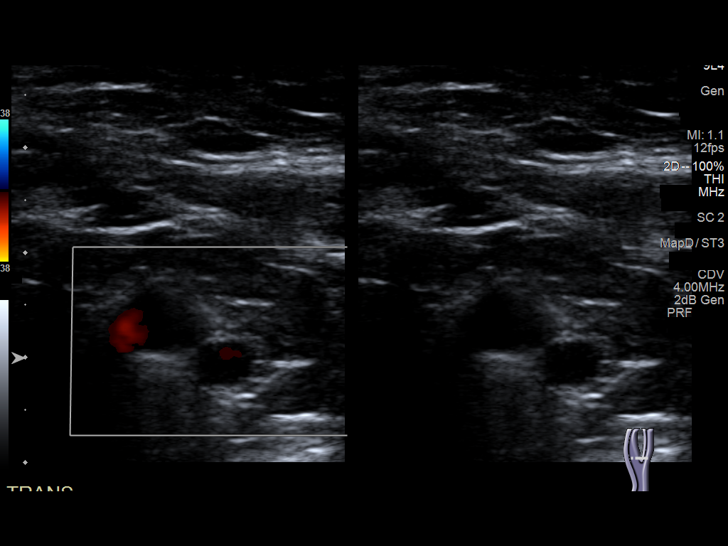
[im 23/66]
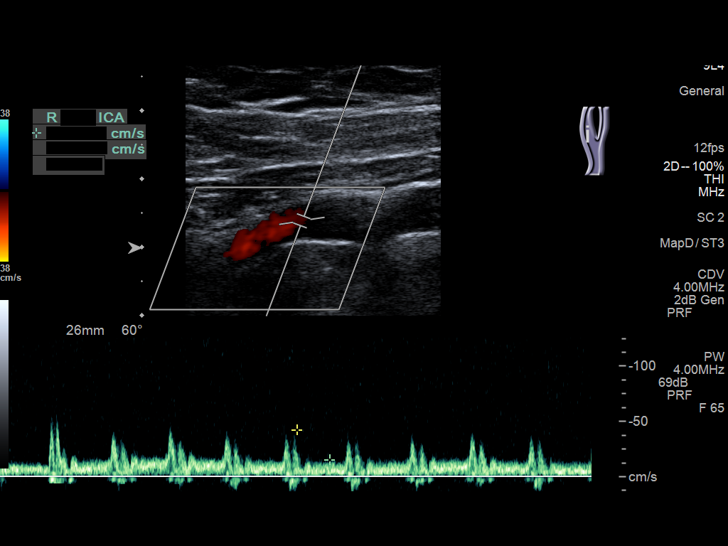
[im 29/66]
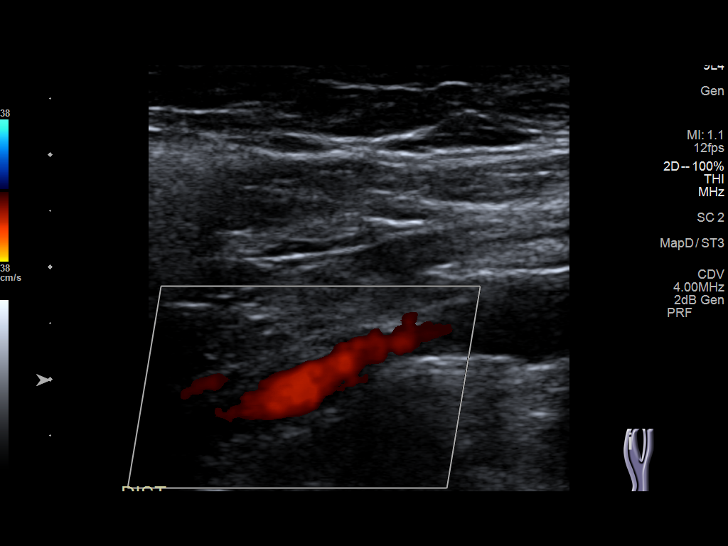
[im 34/66]
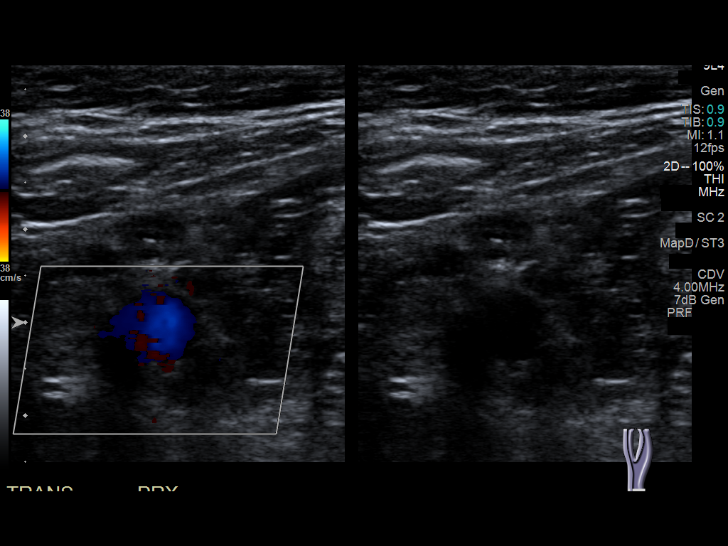
[im 37/66]
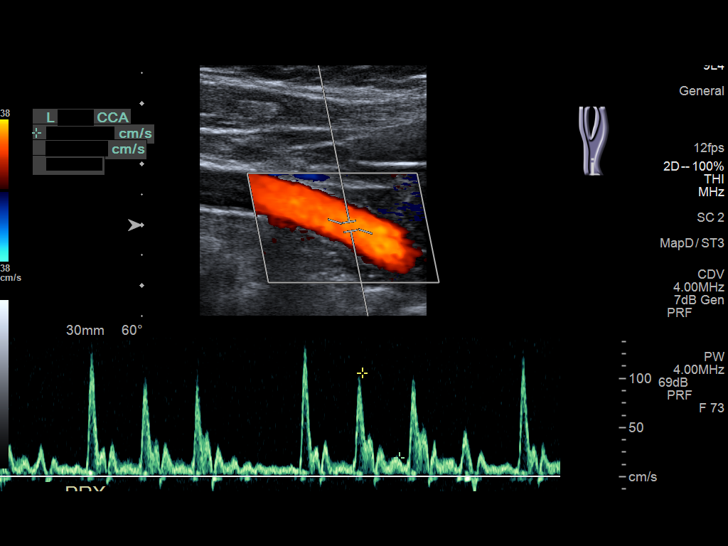
[im 43/66]
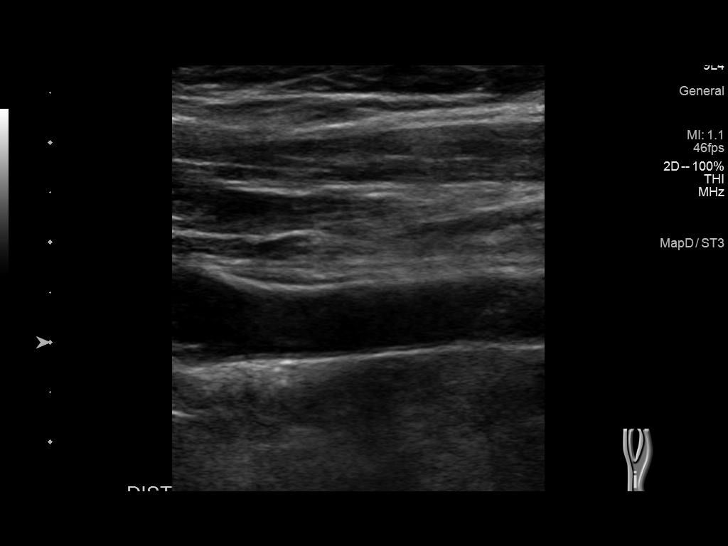
[im 49/66]
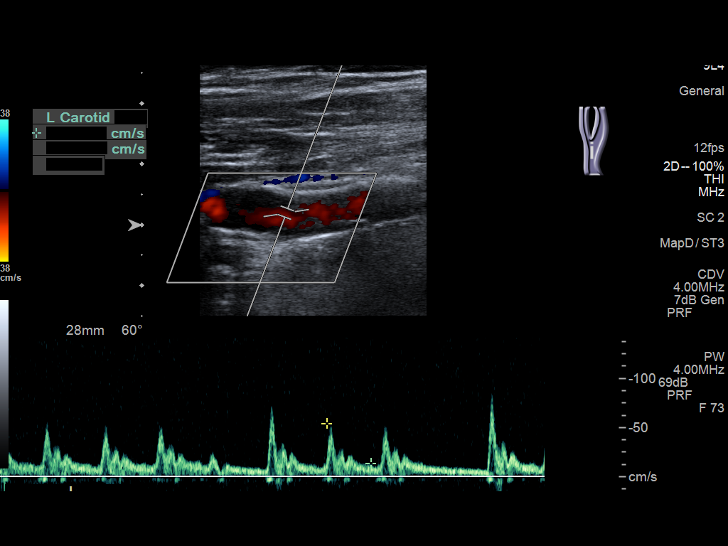
[im 54/66]
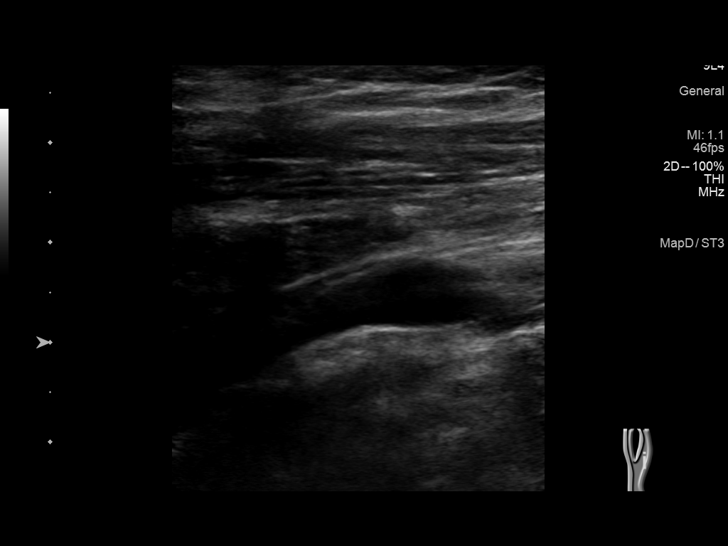
[im 60/66]
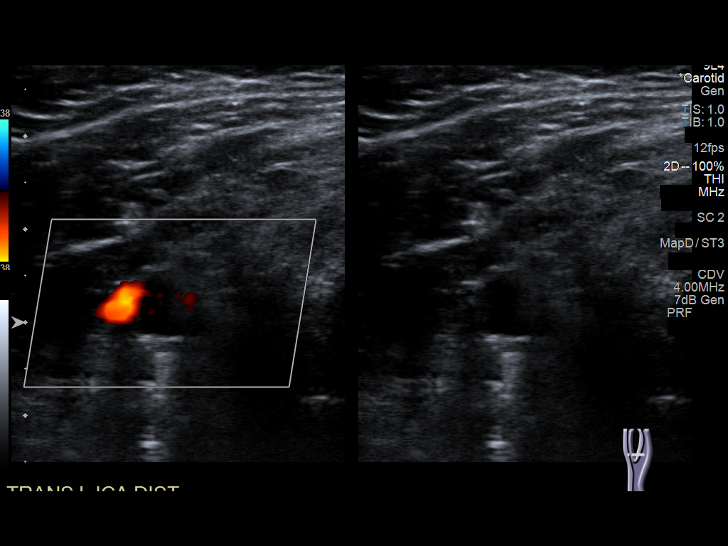
[im 66/66]
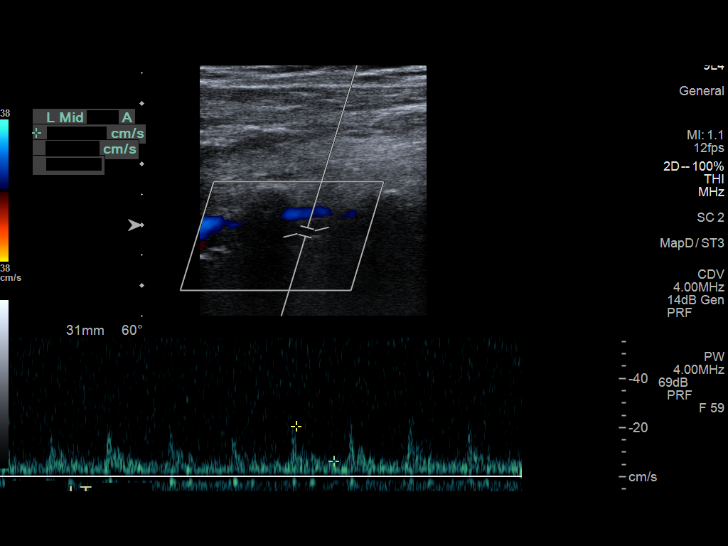

[13 of 24 positions shown; findings below may reference images not displayed]

FINDINGS: Criteria: Quantification of carotid stenosis is based on velocity
parameters that correlate the residual internal carotid diameter
with NASCET-based stenosis levels, using the diameter of the distal
internal carotid lumen as the denominator for stenosis measurement.

The following velocity measurements were obtained:

RIGHT

ICA: 84/30 cm/sec

CCA: 78/13 cm/sec

SYSTOLIC ICA/CCA RATIO:

ECA: 69 cm/sec

LEFT

ICA: 80/38 cm/sec

CCA: 58/16 cm/sec

SYSTOLIC ICA/CCA RATIO:

ECA: 66 cm/sec

RIGHT CAROTID ARTERY: No significant plaque formation. No
hemodynamically significant right ICA stenosis, velocity elevation,
or turbulent flow. Degree of narrowing less than 50%.

RIGHT VERTEBRAL ARTERY:  Antegrade

LEFT CAROTID ARTERY: No significant plaque formation. No
hemodynamically significant left ICA stenosis, velocity elevation,
or turbulent flow.

LEFT VERTEBRAL ARTERY:  Antegrade
IMPRESSION: No significant carotid atherosclerosis. No hemodynamically
significant ICA stenosis. Degree of narrowing less than 50%
bilaterally by ultrasound criteria.

Patent antegrade vertebral flow bilaterally

## 2020-08-02 NOTE — Telephone Encounter (Signed)
  Patient Consent for Virtual Visit         Leah Osborne has provided verbal consent on 08/02/2020 for a virtual visit (video or telephone).   CONSENT FOR VIRTUAL VISIT FOR:  Leah Osborne  By participating in this virtual visit I agree to the following:  I hereby voluntarily request, consent and authorize [facility] and its employed or contracted physicians, Engineer, materials, nurse practitioners or other licensed health care professionals (the Practitioner), to provide me with telemedicine health care services (the "Services") as deemed necessary by the treating Practitioner. I acknowledge and consent to receive the Services by the Practitioner via telemedicine. I understand that the telemedicine visit will involve communicating with the Practitioner through live audiovisual communication technology and the disclosure of certain medical information by electronic transmission. I acknowledge that I have been given the opportunity to request an in-person assessment or other available alternative prior to the telemedicine visit and am voluntarily participating in the telemedicine visit.  I understand that I have the right to withhold or withdraw my consent to the use of telemedicine in the course of my care at any time, without affecting my right to future care or treatment, and that the Practitioner or I may terminate the telemedicine visit at any time. I understand that I have the right to inspect all information obtained and/or recorded in the course of the telemedicine visit and may receive copies of available information for a reasonable fee.  I understand that some of the potential risks of receiving the Services via telemedicine include:  Marland Kitchen Delay or interruption in medical evaluation due to technological equipment failure or disruption; . Information transmitted may not be sufficient (e.g. poor resolution of images) to allow for appropriate medical decision making by the  Practitioner; and/or  . In rare instances, security protocols could fail, causing a breach of personal health information.  Furthermore, I acknowledge that it is my responsibility to provide information about my medical history, conditions and care that is complete and accurate to the best of my ability. I acknowledge that Practitioner's advice, recommendations, and/or decision may be based on factors not within their control, such as incomplete or inaccurate data provided by me or distortions of diagnostic images or specimens that may result from electronic transmissions. I understand that the practice of medicine is not an exact science and that Practitioner makes no warranties or guarantees regarding treatment outcomes. I acknowledge that a copy of this consent can be made available to me via my patient portal ([facility]), or I can request a printed copy by calling the office of [facility].    I understand that my insurance will be billed for this visit.   I have read or had this consent read to me. . I understand the contents of this consent, which adequately explains the benefits and risks of the Services being provided via telemedicine.  . I have been provided ample opportunity to ask questions regarding this consent and the Services and have had my questions answered to my satisfaction. . I give my informed consent for the services to be provided through the use of telemedicine in my medical care

## 2020-08-11 ENCOUNTER — Other Ambulatory Visit: Payer: Self-pay

## 2020-08-11 ENCOUNTER — Ambulatory Visit
Admission: RE | Admit: 2020-08-11 | Discharge: 2020-08-11 | Disposition: A | Discharge status: Home / Self Care | Payer: Managed Care, Other (non HMO) | Source: Ambulatory Visit | Attending: Sports Medicine | Admitting: Sports Medicine

## 2020-08-11 ENCOUNTER — Telehealth: Payer: Self-pay

## 2020-08-11 VITALS — BP 119/66 | HR 72 | Temp 98.5°F | Resp 18 | Ht 61.0 in | Wt 260.0 lb

## 2020-08-11 DIAGNOSIS — H6123 Impacted cerumen, bilateral: Secondary | ICD-10-CM | POA: Diagnosis not present

## 2020-08-11 DIAGNOSIS — H938X3 Other specified disorders of ear, bilateral: Secondary | ICD-10-CM | POA: Diagnosis not present

## 2020-08-11 NOTE — Telephone Encounter (Signed)
I contacted the patient and offered her a virtual visit and she declined. I scheduled her an appt for tomorrow afternoon.

## 2020-08-11 NOTE — Telephone Encounter (Signed)
That will be fine. We can see her 2/25 for hearing loss.  She has history of CVA and some complex neurological issues and history of prior ENT Evaluation in 2019 for hoarse voice.  We can evaluate her and refer if indicated.  Nobie Putnam, [facility] 08/11/2020, 8:48 AM

## 2020-08-11 NOTE — ED Triage Notes (Signed)
Patient c/o hearing loss in her right ear that started 2 days ago. She states the left one is now diminished some. She denies pain.

## 2020-08-11 NOTE — Telephone Encounter (Signed)
Copied from [facility] [id number]. Topic: General - Other >> Aug 11, 2020  8:03 AM Leward Quan A wrote: Reason for CRM: Patient called in to inquire if she can get an appointment to be seen today. Can not hear from her right ear and say the left is starting to act up also. Asking if there is anyway for her to be seen today please call with a yes or a solution from Dr Raliegh Ip. Ph# [phone]

## 2020-08-11 NOTE — ED Provider Notes (Signed)
[facility]    CSN: [id number] Arrival date & time: 08/11/20  1408      History   Chief Complaint Chief Complaint  Patient presents with  . Ear Fullness  . Appointment    HPI Leah Osborne is a 35 y.o. female presenting for approximately 2-day history of right-sided reduced hearing, tinnitus and discomfort.  She denies any pain.  No drainage from ears.  She says that her left ear is starting to feel similarly.  No fever, fatigue, cough, congestion or sore throat.  No headaches or dizziness.  No breathing difficulty.  No exposure to COVID-19.  Patient believes she may have impacted earwax.  She has an appoint with her PCP tomorrow, but says she could not wait to be seen.  Has not taken any over-the-counter medications or used any OTC eardrops.  No other concerns today.  HPI  Past Medical History:  Diagnosis Date  . Abnormal Pap smear of cervix   . Arthritis   . Arthritis, rheumatoid ([place])   . Atypical chest pain   . Bipolar 1 disorder ([place])   . Headache    MIGRAINES  . Hyperlipidemia   . Hypertension   . Morbid obesity ([place])    a. 11/2018 s/p Robotic Sleeve Gastrectomy @ [facility].  Marland Kitchen Palpitations    a. 09/2017 Zio Monitor: predominantly RSR, avg HR 87, 11 A tach/SVT episodes, fastest 176 x 7 beats, longest 9 beats. Rare PAC's, occas PVC's, rare couplets/triplets.  . Persistent Nausea and vomiting    a. Following sleeve gastrectomy 11/2018  . PTSD (post-traumatic stress disorder)   . PVC (premature ventricular contraction)   . Social phobia   . Stroke [facility])     Patient Active Problem List   Diagnosis Date Noted  . History of cerebrovascular accident (CVA) with residual deficit 01/27/2019  . 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 Intractable nausea and vomiting 01/10/2019  . 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 Hypokalemia 01/01/2019  . Elevated hemoglobin A1c 02/12/2018  .  Dyslipidemia 02/05/2018  . Abnormal skin growth 11/15/2016  . PTSD (post-traumatic stress disorder) 10/24/2016  . Severe bipolar I disorder, most recent episode depressed without psychotic features ([place]) 10/23/2016  . Ankylosing spondylitis ([place]) 10/23/2016  . Sacroiliac inflammation ([place]) 08/02/2016  . Hypertension 05/28/2016  . Vitamin D deficiency 05/02/2016  . 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 Hypothyroidism 02/13/2016  . Anxiety and depression 01/23/2016  . Status post tubal ligation 09/22/2015  . Morbid (severe) obesity due to excess calories ([place]) 04/25/2015  . Anemia, iron deficiency 01/04/2015  . Headache, migraine 01/04/2015  . Abnormal cytological findings in female genital organs 07/04/2011    Past Surgical History:  Procedure Laterality Date  . CHOLECYSTECTOMY    . LAPAROSCOPIC GASTRIC SLEEVE RESECTION  11/19/2018  . LAPAROSCOPIC TUBAL LIGATION Bilateral 09/19/2015   Procedure: LAPAROSCOPIC BILATERAL TUBAL BANDING;  Surgeon: Brayton Mars, MD;  Location: [facility] ORS;  Service: Gynecology;  Laterality: Bilateral;    OB History    Gravida  2   Para  2   Term  2   Preterm  0   AB  0   Living  2     SAB  0   IAB  0   Ectopic  0   Multiple  0   Live Births  2            Home Medications  Prior to Admission medications   Medication Sig Start Date End Date Taking? Authorizing Provider  aspirin 81 MG EC tablet TAKE 1 TABLET BY MOUTH EVERY DAY 10/04/19  Yes Melvenia Beam, MD  atorvastatin (LIPITOR) 40 MG tablet Take 1 tablet by mouth daily at 6 pm. Must be seen for further refills. 06/29/20  Yes Melvenia Beam, MD  busPIRone (BUSPAR) 10 MG tablet Take 10 mg by mouth 3 (three) times daily.   Yes [provider]  lamoTRIgine (LAMICTAL) 150 MG tablet Take 150 mg by mouth daily. 08/03/20  Yes [provider]  levothyroxine (SYNTHROID) 25 MCG tablet Take 1 tablet (25 mcg total) by mouth daily  before breakfast. 09/01/19  Yes Karamalegos, Devonne Doughty, DO  spironolactone (ALDACTONE) 25 MG tablet Take 1 tablet (25 mg total) by mouth daily. 03/04/20  Yes Rubie Maid, MD  traZODone (DESYREL) 50 MG tablet  08/03/20  Yes [provider]  ALPRAZolam (XANAX) 0.25 MG tablet Take 1-2 tabs (0.25mg -0.50mg ) 30-60 minutes before procedure. May repeat if needed.Do not drive. 04/08/19   Melvenia Beam, MD  ALPRAZolam Duanne Moron) 0.25 MG tablet Take 1-2 tabs (0.25mg -0.50mg ) 30-60 minutes before procedure. May repeat if needed.Do not drive. 10/07/19   Melvenia Beam, MD  ARIPiprazole (ABILIFY) 5 MG tablet Take 5 mg by mouth daily.    [provider]  Elagolix Sodium (ORILISSA) 150 MG TABS Take 150 mg by mouth daily. 10/26/19   Rubie Maid, MD  esomeprazole (NEXIUM) 40 MG capsule Take 40 mg by mouth daily.  12/25/18 06/23/19  [provider]  Fremanezumab-vfrm (AJOVY) 225 MG/1.5ML SOAJ Inject 225 mg into the skin every 30 (thirty) days. 05/06/19   Melvenia Beam, MD  OLANZapine zydis (ZYPREXA) 5 MG disintegrating tablet Take 0.5 tablets (2.5 mg total) by mouth 2 (two) times a day. Patient not taking: No sig reported 01/18/19   Otila Back, MD  Rimegepant Sulfate (NURTEC) 75 MG TBDP Take 75 mg by mouth daily as needed. For migraines. Take as close to onset of migraine as possible. One daily maximum. 05/06/19   Melvenia Beam, MD  thiamine 100 MG tablet Take 1 tablet (100 mg total) by mouth daily. Patient not taking: No sig reported 09/01/19   Olin Hauser, DO    Family History Family History  Problem Relation Age of Onset  . Diabetes Maternal Grandmother   . Heart disease Maternal Grandmother   . Heart failure Maternal Grandmother   . Heart attack Maternal Grandmother   . Rheum arthritis Mother   . Healthy Father   . Heart Problems Maternal Grandfather   . Stroke Maternal Grandfather   . Atrial fibrillation Paternal Grandmother   . Stroke Other        mat great gf  had stroke at 29  . Cancer Neg Hx     Social History Social History   Tobacco Use  . Smoking status: Never Smoker  . Smokeless tobacco: Never Used  Vaping Use  . Vaping Use: Never used  Substance Use Topics  . Alcohol use: Not Currently    Alcohol/week: 0.0 standard drinks  . Drug use: No     Allergies   Patient has no known allergies.   Review of Systems Review of Systems  Constitutional: Negative for chills, diaphoresis, fatigue and fever.  HENT: Positive for hearing loss. Negative for congestion, ear discharge, ear pain, rhinorrhea, sinus pressure and sore throat.        Bilateral ear fullness, worse on  right  Respiratory: Negative for cough and shortness of breath.   Gastrointestinal: Negative for nausea and vomiting.  Skin: Negative for rash.  Neurological: Negative for dizziness, weakness and headaches.     Physical Exam Triage Vital Signs ED Triage Vitals  Enc Vitals Group     BP 08/11/20 1422 119/66     Pulse Rate 08/11/20 1422 72     Resp 08/11/20 1422 18     Temp 08/11/20 1422 98.5 F (36.9 C)     Temp Source 08/11/20 1422 Oral     SpO2 08/11/20 1422 100 %     Weight 08/11/20 1420 260 lb (117.9 kg)     Height 08/11/20 1420 5\' 1"  (1.549 m)     Head Circumference --      Peak Flow --      Pain Score 08/11/20 1419 0     Pain Loc --      Pain Edu? --      Excl. in [place]? --    No data found.  Updated Vital Signs BP 119/66 (BP Location: Left Arm)   Pulse 72   Temp 98.5 F (36.9 C) (Oral)   Resp 18   Ht 5\' 1"  (1.549 m)   Wt 260 lb (117.9 kg)   SpO2 100%   BMI 49.13 kg/m       Physical Exam Vitals and nursing note reviewed.  Constitutional:      General: She is not in acute distress.    Appearance: Normal appearance. She is not ill-appearing or toxic-appearing.  HENT:     Head: Normocephalic and atraumatic.     Right Ear: External ear normal. Decreased hearing noted. There is impacted cerumen.     Left Ear: External ear normal. There is  impacted cerumen.     Nose: Nose normal.     Mouth/Throat:     Mouth: Mucous membranes are moist.     Pharynx: Oropharynx is clear.  Eyes:     General: No scleral icterus.       Right eye: No discharge.        Left eye: No discharge.     Conjunctiva/sclera: Conjunctivae normal.  Cardiovascular:     Rate and Rhythm: Normal rate and regular rhythm.     Heart sounds: Normal heart sounds.  Pulmonary:     Effort: Pulmonary effort is normal. No respiratory distress.     Breath sounds: Normal breath sounds.  Musculoskeletal:     Cervical back: Neck supple.  Skin:    General: Skin is dry.  Neurological:     General: No focal deficit present.     Mental Status: She is alert. Mental status is at baseline.     Motor: No weakness.     Gait: Gait normal.  Psychiatric:        Mood and Affect: Mood normal.        Behavior: Behavior normal.        Thought Content: Thought content normal.      UC Treatments / Results  Labs (all labs ordered are listed, but only abnormal results are displayed) Labs Reviewed - No data to display  EKG   Radiology No results found.  Procedures Procedures (including critical care time)  Medications Ordered in UC Medications - No data to display  Initial Impression / Assessment and Plan / UC Course  I have reviewed the triage vital signs and the nursing notes.  Pertinent labs & imaging results that were available during  my care of the patient were reviewed by me and considered in my medical decision making (see chart for details).   35 year old female presenting for bilateral ear fullness and reduced hearing with symptoms worse on the right side.  Exam reveals cerumen impaction bilaterally.  Otic lavage performed.  After lavage patient reported improved symptoms and return of hearing.  She denied any pain.  TMs are normal-appearing after lavage.  Discussed management of earwax.  Advised to follow-up as needed if she develops any pain in the ear or any  other symptoms.   Final Clinical Impressions(s) / UC Diagnoses   Final diagnoses:  Bilateral impacted cerumen  Sensation of fullness in both ears   Discharge Instructions   None    ED Prescriptions    None     PDMP not reviewed this encounter.   Danton Clap, PA-C 08/11/20 1537

## 2020-08-12 ENCOUNTER — Ambulatory Visit: Payer: Self-pay | Admitting: Family Medicine

## 2020-09-08 DIAGNOSIS — F5104 Psychophysiologic insomnia: Secondary | ICD-10-CM

## 2020-09-08 DIAGNOSIS — F411 Generalized anxiety disorder: Secondary | ICD-10-CM

## 2020-09-08 DIAGNOSIS — F314 Bipolar disorder, current episode depressed, severe, without psychotic features: Secondary | ICD-10-CM

## 2020-09-10 MED ORDER — LAMOTRIGINE 150 MG PO TABS
150.0000 mg | ORAL_TABLET | Freq: Every day | ORAL | 0 refills | Status: DC
Start: 2020-09-10 — End: 2022-03-07

## 2020-09-10 MED ORDER — TRAZODONE HCL 50 MG PO TABS: 50.0000 mg | ORAL_TABLET | Freq: Every day | ORAL | 0 refills | Status: DC

## 2020-09-10 MED ORDER — BUSPIRONE HCL 10 MG PO TABS: 10.0000 mg | ORAL_TABLET | Freq: Three times a day (TID) | ORAL | 0 refills | Status: DC

## 2020-09-10 NOTE — Addendum Note (Signed)
Addended by: Olin Hauser on: 09/10/2020 09:12 AM   Modules accepted: Orders

## 2020-09-10 NOTE — Addendum Note (Signed)
Addended by: Olin Hauser on: 09/10/2020 09:59 AM   Modules accepted: Orders

## 2020-09-13 ENCOUNTER — Other Ambulatory Visit: Payer: Self-pay

## 2020-09-13 ENCOUNTER — Telehealth: Payer: Managed Care, Other (non HMO) | Admitting: Cardiovascular Disease

## 2020-09-14 ENCOUNTER — Encounter: Payer: Self-pay | Admitting: Cardiovascular Disease

## 2020-10-09 ENCOUNTER — Other Ambulatory Visit: Payer: Self-pay | Admitting: Neurology

## 2020-10-09 DIAGNOSIS — I6930 Unspecified sequelae of cerebral infarction: Secondary | ICD-10-CM

## 2020-10-09 DIAGNOSIS — E785 Hyperlipidemia, unspecified: Secondary | ICD-10-CM

## 2020-11-06 ENCOUNTER — Other Ambulatory Visit: Payer: Self-pay | Admitting: Neurology

## 2020-11-06 DIAGNOSIS — E785 Hyperlipidemia, unspecified: Secondary | ICD-10-CM

## 2020-11-06 DIAGNOSIS — I6930 Unspecified sequelae of cerebral infarction: Secondary | ICD-10-CM

## 2020-11-07 NOTE — Telephone Encounter (Signed)
Called and LVM, pt has not been seen in almost 2 years. Needs to make overdue f/u appt or she can have it refilled by her PCP. We can only do 1 month supply if appt is made w/ jessica,NP.

## 2020-11-09 ENCOUNTER — Ambulatory Visit: Payer: Managed Care, Other (non HMO) | Admitting: Adult Health

## 2020-11-09 ENCOUNTER — Encounter: Payer: Self-pay | Admitting: Adult Health

## 2020-11-09 ENCOUNTER — Other Ambulatory Visit: Payer: Self-pay

## 2020-11-09 VITALS — BP 117/79 | HR 52 | Ht 61.0 in | Wt 272.0 lb

## 2020-11-09 DIAGNOSIS — E785 Hyperlipidemia, unspecified: Secondary | ICD-10-CM

## 2020-11-09 DIAGNOSIS — I6930 Unspecified sequelae of cerebral infarction: Secondary | ICD-10-CM | POA: Diagnosis not present

## 2020-11-09 DIAGNOSIS — G43709 Chronic migraine without aura, not intractable, without status migrainosus: Secondary | ICD-10-CM

## 2020-11-09 DIAGNOSIS — G371 Central demyelination of corpus callosum: Secondary | ICD-10-CM | POA: Diagnosis not present

## 2020-11-09 MED ORDER — ATORVASTATIN CALCIUM 40 MG PO TABS: 40.0000 mg | ORAL_TABLET | Freq: Every day | ORAL | 3 refills | Status: DC

## 2020-11-09 MED ORDER — ASPIRIN 81 MG PO TBEC: 81.0000 mg | DELAYED_RELEASE_TABLET | Freq: Every day | ORAL | 3 refills | Status: DC

## 2020-11-09 MED ORDER — DIAZEPAM 2 MG PO TABS: 2.0000 mg | ORAL_TABLET | ORAL | 0 refills | Status: DC | PRN

## 2020-11-09 NOTE — Patient Instructions (Signed)
Check cholesterol levels today  Order will be placed for open MR brain - will also place order for

## 2020-11-09 NOTE — Progress Notes (Addendum)
[facility]    Provider:  Dr Jaynee Eagles Requesting Provider: Nobie Putnam * Primary Care Provider:  Olin Hauser, DO  CC:  Stroke  HPI:  Today, 11/09/2020, Ms. Leah Osborne returns for routine follow-up and medication refill after prior visit 1.5 years ago with Dr. Jaynee Eagles.  She has been doing well since prior visit and has since self discontinued Ajovy and Nurtec.  She reports approximately 1 migraine per month.  She has remained on aspirin and atorvastatin for stroke prevention.  Blood pressure today 117/79.  She does report consistent dizziness/vertigo which has been present since her stroke with some improvement but does not interfere with daily activity or functioning.  She continues to work without difficulty.  History of abnormal MRI and recommended repeat imaging in 11/2019 but not completed as she reports she " chickened out" due to claustrophobia.  She is agreeable to obtain MRI if she is able to have sedation.  No further concerns at this time     History provided for reference purposes only Interval history 05/06/2019 Dr. Jaynee Eagles: Repeat MRI of the brain did not show the possible ischemic event, we also completed it with MS protocol .Discussed migraines. Reviewed MRi brain images. Repeat MRI of the brain images in 6 months she will come in. Discussed MS vs Strokes or other etiologies. Does not appear to be MS but need to follow serially with MRI q54months. Started Ajovy and nurtec.  Discussed acute and chronic migraine management.  Tried topamax, propranolol, effexor   HPI:  Leah Osborne is a 35 y.o. female here as requested by Nobie Putnam * for Stroke. PMHx PTSD, obesity, HT, HLD, migraines, bipolar 1, migraine w/aura  She has chronic nausea and vomiting since June since bariatric surgery, she has lost 34 pounds, been dehydrated. She was discharged from [facility] for this, she went to [facility] and they were going to send her home and to  follow up with surgery. She had double vision and unsteadiness. She went to the eye doctor, he thought it was Wernicke due to deficiency and MRI showed stroke. Maternal grandfather had a stroke when 45, maternal grandfather also had a stroke at the age of 87. She has no history of DVTs or miscarriages. Not on aspirin. She had a sleeve not a roux-en-y. Her balance is impaired and she can fall. 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 She has not had Physical Therapy. She lives in [place]. Double vision is better but not completely better. She is following with Ophthalmology for eye therapy. She has weakness int he right leg. Doble vision Started 23rd of July.  Reviewed notes, labs and imaging from outside physicians, which showed:  ldl 80  Personally reviewed MRI brain images and agree: IMPRESSION: Small focus of restricted diffusion splenium of corpus callosum most consistent with acute or subacute infarction. Otherwise negative.  Reviewed echocardiogram report:   IMPRESSIONS    1. The left ventricle has normal systolic function with an ejection fraction of 60-65%. The cavity size was normal. Left ventricular diastolic parameters were normal.  2. The right ventricle has normal systolic function. The cavity was normal. There is no increase in right ventricular wall thickness. Right ventricular systolic pressure is mildly elevated with an estimated pressure of 33.6 mmHg.  3. Rare PVCs  FINDINGS  Left Ventricle: The left ventricle has normal systolic function, with an ejection fraction of 60-65%. The cavity size was normal. There  is no increase in left ventricular wall thickness. Left ventricular diastolic parameters were normal. Definity  contrast agent was given IV to delineate the left ventricular endocardial borders.  Right Ventricle: The right ventricle has normal systolic function. The cavity was normal. There is no increase in right  ventricular wall thickness. Right ventricular systolic pressure is mildly elevated with an estimated pressure of 33.6 mmHg.  Left Atrium: Left atrial size was normal in size.  Right Atrium: Right atrial size was normal in size. Right atrial pressure is estimated at 10 mmHg.  Interatrial Septum: No atrial level shunt detected by color flow Doppler.  Pericardium: There is no evidence of pericardial effusion.  Mitral Valve: The mitral valve is normal in structure. Mitral valve regurgitation is not visualized by color flow Doppler.  Tricuspid Valve: The tricuspid valve is normal in structure. Tricuspid valve regurgitation was not visualized by color flow Doppler.  Aortic Valve: The aortic valve is grossly normal Aortic valve regurgitation was not visualized by color flow Doppler.  Pulmonic Valve: The pulmonic valve was grossly normal. Pulmonic valve regurgitation is not visualized by color flow Doppler.  Aorta: The aorta is normal in size and structure.  Venous: The inferior vena cava is normal in size with greater than 50% respiratory variability.  Carotid Dopplers: IMPRESSION: No significant carotid atherosclerosis. No hemodynamically significant ICA stenosis. Degree of narrowing less than 50% bilaterally by ultrasound criteria.  Review of Systems: Patient complains of symptoms per HPI.Marland Kitchen Pertinent negatives and positives per HPI. All others negative.   Social History   Socioeconomic History  . Marital status: Married    Spouse name: Not on file  . Number of children: Not on file  . Years of education: Not on file  . Highest education level: Not on file  Occupational History  . Not on file  Tobacco Use  . Smoking status: Never Smoker  . Smokeless tobacco: Never Used  Vaping Use  . Vaping Use: Never used  Substance and Sexual Activity  . Alcohol use: Not Currently    Alcohol/week: 0.0 standard drinks  . Drug use: No  . Sexual activity: Yes    Birth  control/protection: Surgical    Comment: tubal  Other Topics Concern  . Not on file  Social History Narrative   Lives in [place] with husband   Right handed   Social Determinants of Health   Financial Resource Strain: Not on file  Food Insecurity: Not on file  Transportation Needs: Not on file  Physical Activity: Not on file  Stress: Not on file  Social Connections: Not on file  Intimate Partner Violence: Not on file    Family History  Problem Relation Age of Onset  . Diabetes Maternal Grandmother   . Heart disease Maternal Grandmother   . Heart failure Maternal Grandmother   . Heart attack Maternal Grandmother   . Rheum arthritis Mother   . Healthy Father   . Heart Problems Maternal Grandfather   . Stroke Maternal Grandfather   . Atrial fibrillation Paternal Grandmother   . Stroke Other        mat great gf had stroke at 29  . Cancer Neg Hx     Past Medical History:  Diagnosis Date  . Abnormal Pap smear of cervix   . Arthritis   . Arthritis, rheumatoid ([place])   . Atypical chest pain   . Bipolar 1 disorder ([place])   . Headache    MIGRAINES  . Hyperlipidemia   . Hypertension   .  Morbid obesity ([place])    a. 11/2018 s/p Robotic Sleeve Gastrectomy @ [facility].  Marland Kitchen Palpitations    a. 09/2017 Zio Monitor: predominantly RSR, avg HR 87, 11 A tach/SVT episodes, fastest 176 x 7 beats, longest 9 beats. Rare PAC's, occas PVC's, rare couplets/triplets.  . Persistent Nausea and vomiting    a. Following sleeve gastrectomy 11/2018  . PTSD (post-traumatic stress disorder)   . PVC (premature ventricular contraction)   . Social phobia   . Stroke [facility])     Patient Active Problem List   Diagnosis Date Noted  . History of cerebrovascular accident (CVA) with residual deficit 01/27/2019  . 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 Intractable nausea and vomiting 01/10/2019  . 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  Hypokalemia 01/01/2019  . Elevated hemoglobin A1c 02/12/2018  . Dyslipidemia 02/05/2018  . Abnormal skin growth 11/15/2016  . PTSD (post-traumatic stress disorder) 10/24/2016  . Severe bipolar I disorder, most recent episode depressed without psychotic features ([place]) 10/23/2016  . Ankylosing spondylitis ([place]) 10/23/2016  . Sacroiliac inflammation ([place]) 08/02/2016  . Hypertension 05/28/2016  . Vitamin D deficiency 05/02/2016  . 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 Hypothyroidism 02/13/2016  . Anxiety and depression 01/23/2016  . Status post tubal ligation 09/22/2015  . Morbid (severe) obesity due to excess calories ([place]) 04/25/2015  . Anemia, iron deficiency 01/04/2015  . Headache, migraine 01/04/2015  . Abnormal cytological findings in female genital organs 07/04/2011    Past Surgical History:  Procedure Laterality Date  . CHOLECYSTECTOMY    . LAPAROSCOPIC GASTRIC SLEEVE RESECTION  11/19/2018  . LAPAROSCOPIC TUBAL LIGATION Bilateral 09/19/2015   Procedure: LAPAROSCOPIC BILATERAL TUBAL BANDING;  Surgeon: Brayton Mars, MD;  Location: [facility] ORS;  Service: Gynecology;  Laterality: Bilateral;    Current Outpatient Medications  Medication Sig Dispense Refill  . busPIRone (BUSPAR) 10 MG tablet Take 1 tablet (10 mg total) by mouth 3 (three) times daily. 270 tablet 0  . diazepam (VALIUM) 2 MG tablet Take 1 tablet (2 mg total) by mouth as needed for anxiety (Prior to MRI - may repeat x1 after 30 minutes if needed). 2 tablet 0  . lamoTRIgine (LAMICTAL) 150 MG tablet Take 1 tablet (150 mg total) by mouth daily. 90 tablet 0  . levothyroxine (SYNTHROID) 25 MCG tablet Take 1 tablet (25 mcg total) by mouth daily before breakfast. 90 tablet 3  . spironolactone (ALDACTONE) 25 MG tablet Take 1 tablet (25 mg total) by mouth daily. 90 tablet 3  . traZODone (DESYREL) 50 MG tablet Take 1 tablet (50 mg total) by mouth at bedtime. 90 tablet 0  . aspirin 81 MG EC  tablet Take 1 tablet (81 mg total) by mouth daily. Swallow whole. 90 tablet 3  . atorvastatin (LIPITOR) 40 MG tablet Take 1 tablet (40 mg total) by mouth daily. 90 tablet 3   No current facility-administered medications for this visit.    Allergies as of 11/09/2020  . (No Known Allergies)    Vitals: BP 117/79   Pulse (!) 52   Ht 5\' 1"  (1.549 m)   Wt 272 lb (123.4 kg)   BMI 51.39 kg/m  Last Weight:  Wt Readings from Last 1 Encounters:  11/09/20 272 lb (123.4 kg)   Last Height:   Ht Readings from Last 1 Encounters:  11/09/20 5\' 1"  (1.549 m)     Physical exam: Exam: Gen: NAD, conversant, well nourised, pleasant  middle-age Caucasian female, obese, well groomed                     CV: RRR, no MRG. No Carotid Bruits. No peripheral edema, warm, nontender Eyes: Conjunctivae clear without exudates or hemorrhage  Neuro: Detailed Neurologic Exam  Speech:    Speech is normal; fluent and spontaneous with normal comprehension.  Cognition:    The patient is oriented to person, place, and time;     recent and remote memory intact;     language fluent;     normal attention, concentration,     fund of knowledge Cranial Nerves:    The pupils are equal, round, and reactive to light. The fundi are normal and spontaneous venous pulsations are present. Visual fields are full to finger confrontation. Extraocular movements are intact. Trigeminal sensation is intact and the muscles of mastication are normal. The face is symmetric. The palate elevates in the midline. Hearing intact. Voice is normal. Shoulder shrug is normal. The tongue has normal motion without fasciculations.   Coordination:    Normal finger to nose and heel to shin. Normal rapid alternating movements.   Gait:    Stands from seated position independently.  Gait demonstrates normal stride length and balance without use of assistive device.  Heel-toe and tandem gait are normal.  Romberg negative.  Motor Observation:    No  asymmetry, no atrophy, and no involuntary movements noted. Tone:    Normal muscle tone.    Posture:    Posture is normal. normal erect    Strength:    Strength is V/V in the upper and lower limbs.      Sensation: intact to LT     Reflex Exam:  DTR's:    Deep tendon reflexes in the upper and lower extremities are normal bilaterally.   Toes:    The toes are downgoing bilaterally.   Clonus:    Clonus is absent.    Assessment/Plan:  35 y.o. female here followed by Dr. Jaynee Eagles as requested by Nobie Putnam for Stroke. PMHx PTSD, obesity, HT, HLD, migraines, bipolar 1, migraine w/aura  She has chronic nausea and vomiting since June since bariatric surgery, she has lost 34 pounds, been dehydrated. She was discharged from [facility] for this, she went to [facility] and they were going to send her home and to follow up with surgery. She had double vision and unsteadiness. She went to the eye doctor, he thought it was Wernicke due to deficiency and MRI showed stroke  splenium of corpus callosum.  Repeat MRI shows resolution of the lesion.  Multiple sclerosis protocol not consistent with multiple sclerosis.  She has not had repeat imaging since 03/2019 as she was lost to follow-up.  She also has chronic migraines which are currently stable without medication management   1. Central demyelination of corpus callosum [facility]) - MR BRAIN W WO CONTRAST -request open MRI due to claustrophobia and Valium to take prior -she was advised to have a ride for appointment which she verbalized understanding - Basic Metabolic Panel  2. Hyperlipidemia LDL goal <70 Continue atorvastatin 40 mg daily - Lipid Panel  3. History of cerebrovascular accident (CVA) with residual deficit Continue aspirin and atorvastatin 40 mg daily -refill provided but advised further refills can be obtained by PCP Discussed importance of routine follow-up with PCP for aggressive stroke risk factor management - atorvastatin (LIPITOR) 40  MG tablet; Take 1 tablet (40 mg total) by mouth daily.  Dispense: 90 tablet;  Refill: 3  4. Chronic migraine without aura without status migrainosus, not intractable Stable with reported 1 migraine per month Ongoing monitoring per PCP    Meds ordered this encounter  Medications  . diazepam (VALIUM) 2 MG tablet    Sig: Take 1 tablet (2 mg total) by mouth as needed for anxiety (Prior to MRI - may repeat x1 after 30 minutes if needed).    Dispense:  2 tablet    Refill:  0  . aspirin 81 MG EC tablet    Sig: Take 1 tablet (81 mg total) by mouth daily. Swallow whole.    Dispense:  90 tablet    Refill:  3  . atorvastatin (LIPITOR) 40 MG tablet    Sig: Take 1 tablet (40 mg total) by mouth daily.    Dispense:  90 tablet    Refill:  3    Appointment needed for further refills.    Follow-up with Dr. Jaynee Eagles will be determined after completion of MRI   CC:  [facility] provider: Dr. Hanley Hays, Devonne Doughty, DO    I spent 35 minutes of face-to-face and non-face-to-face time with patient.  This included previsit chart review, lab review, study review, order entry, electronic health record documentation, patient education and discussion regarding above diagnoses, further interventions and indications and answered all other questions to patient satisfaction  Frann Rider, [facility]  [facility] [address] [place], [place] [address]  Phone [phone] Fax [phone] Note: This document was prepared with digital dictation and possible smart phrase technology. Any transcriptional errors that result from this process are unintentional.  Agree with history, physical, neuro exam,assessment and plan as stated.     Sarina Ill, MD [facility]

## 2020-11-10 LAB — LIPID PANEL
Chol/HDL Ratio: 3.3 ratio (ref 0.0–4.4)
Cholesterol, Total: 92 mg/dL — ABNORMAL LOW (ref 100–199)
HDL: 28 mg/dL — ABNORMAL LOW (ref >39)
LDL Chol Calc (NIH): 47 mg/dL (ref 0–99)
Triglycerides: 86 mg/dL (ref 0–149)
VLDL Cholesterol Cal: 17 mg/dL (ref 5–40)

## 2020-11-10 LAB — BASIC METABOLIC PANEL
BUN/Creatinine Ratio: 15 (ref 9–23)
BUN: 13 mg/dL (ref 6–20)
CO2: 24 mmol/L (ref 20–29)
Calcium: 9.5 mg/dL (ref 8.7–10.2)
Chloride: 104 mmol/L (ref 96–106)
Creatinine, Ser: 0.89 mg/dL (ref 0.57–1.00)
Glucose: 85 mg/dL (ref 65–99)
Potassium: 3.9 mmol/L (ref 3.5–5.2)
Sodium: 141 mmol/L (ref 134–144)
eGFR: 88 mL/min/{1.73_m2} (ref >59)

## 2020-11-15 ENCOUNTER — Telehealth: Payer: Self-pay | Admitting: Adult Health

## 2020-11-15 NOTE — Telephone Encounter (Signed)
MRI brain w/wo contrast sent to [facility] for open MRI per patient's request. [employer] [id number]) Josem Kaufmann #[id number] (11/15/20- 02/13/21). Phone [phone].

## 2020-11-30 ENCOUNTER — Encounter: Payer: Self-pay | Admitting: Adult Health

## 2020-12-01 NOTE — Telephone Encounter (Signed)
I called and spoke to Cable with [facility] and she stated pt had done 11-25-20, she would contact radiologist and fax results to me at [phone].

## 2020-12-07 ENCOUNTER — Telehealth: Payer: Self-pay | Admitting: *Deleted

## 2020-12-07 NOTE — Telephone Encounter (Signed)
Patient called back, answered her questions to her stated satisfaction.

## 2020-12-07 NOTE — Telephone Encounter (Signed)
Called patient to review MRI results, call was cut off. Called back, went to VM. Left message informing her NP just recently received results and reviewed them. Advised her the repeat MRI is unchanged from her prior MRI and low suspicion for demyelinating disease. Good news, left # for questions.

## 2020-12-07 NOTE — Telephone Encounter (Signed)
Just recently received results and reviewing them. Can you please let patient know that repeat MRI unchanged from prior MRI and low suspicion for demyelinating disease. Thank you

## 2020-12-07 NOTE — Telephone Encounter (Addendum)
Called patient who asked about her MRI saying there were two spots mentioned. I was reviewing results and advised it is unchanged form 03/2019 MRI. Call was cut off.

## 2020-12-07 NOTE — Telephone Encounter (Signed)
Pt is asking for a call back from [place] to further discuss the results of her MRI

## 2021-01-16 LAB — HEMOGLOBIN A1C: Hemoglobin A1C: 5.8

## 2021-01-16 LAB — LIPID PANEL
Cholesterol: 104 (ref 0–200)
HDL: 31 — AB (ref 35–70)
LDL Cholesterol: 51
Triglycerides: 118 (ref 40–160)

## 2021-01-16 LAB — BASIC METABOLIC PANEL
Creatinine: 0.9 (ref 0.5–1.1)
Glucose: 81

## 2021-01-16 LAB — COMPREHENSIVE METABOLIC PANEL: GFR calc non Af Amer: 93

## 2021-01-27 ENCOUNTER — Other Ambulatory Visit: Payer: Self-pay | Admitting: Family Medicine

## 2021-01-27 DIAGNOSIS — E039 Hypothyroidism, unspecified: Secondary | ICD-10-CM

## 2021-01-27 NOTE — Telephone Encounter (Signed)
Requested medications are due for refill today yes  Requested medications are on the active medication list yes  Last refill 09/03/19  Last visit 02/2019, last labs are over two years old  Future visit scheduled no  Notes to clinic Failed protocol due to no valid visit within 12  months, labs within 360 days, no upcoming visit scheduled.

## 2021-01-31 ENCOUNTER — Encounter: Payer: Self-pay | Admitting: Family Medicine

## 2021-02-17 ENCOUNTER — Encounter: Payer: Self-pay | Admitting: Family Medicine

## 2021-02-17 ENCOUNTER — Ambulatory Visit: Payer: Managed Care, Other (non HMO) | Admitting: Family Medicine

## 2021-02-17 ENCOUNTER — Other Ambulatory Visit: Payer: Self-pay

## 2021-02-17 VITALS — BP 142/82 | HR 74 | Ht 61.0 in | Wt 269.4 lb

## 2021-02-17 DIAGNOSIS — Z6841 Body Mass Index (BMI) 40.0 and over, adult: Secondary | ICD-10-CM | POA: Diagnosis not present

## 2021-02-17 DIAGNOSIS — E039 Hypothyroidism, unspecified: Secondary | ICD-10-CM | POA: Diagnosis not present

## 2021-02-17 DIAGNOSIS — L219 Seborrheic dermatitis, unspecified: Secondary | ICD-10-CM | POA: Diagnosis not present

## 2021-02-17 MED ORDER — FLUOCINONIDE 0.05 % EX SOLN: 1.0000 "application " | Freq: Two times a day (BID) | CUTANEOUS | 1 refills | Status: DC | PRN

## 2021-02-17 MED ORDER — LEVOTHYROXINE SODIUM 25 MCG PO TABS: 25.0000 ug | ORAL_TABLET | Freq: Every day | ORAL | 3 refills | Status: DC

## 2021-02-17 NOTE — Progress Notes (Signed)
Subjective:    Patient ID: Leah Osborne, female    DOB: 1985-10-17, 35 y.o.   MRN: [id number]  Leah Osborne is a 35 y.o. female presenting on 02/17/2021 for Hypothyroidism   HPI  Hypothyroidism On Levothyroxine 55mg daily Needs lab thyroid today and refill  Morbid Obesity BMI >50 PreDM A1c Prior A1c 5.8 to 6%, then had A1c down to 4 range, now back up to 5.8  History of Bradycardia She had symptoms occasionally with slow heart rate and feels fatigued She was on Propranolol in past due to PACs. She has prior cardiologist.  Seborrheic Dermatitis of scalp Admits itchy flaky rash   [facility] 01/27/2019 01/08/2019 10/21/2018  Decreased Interest 0 0 0  Down, Depressed, Hopeless 0 0 0  PHQ - 2 Score 0 0 0  Altered sleeping - - 0  Tired, decreased energy - - 0  Change in appetite - - 0  Feeling bad or failure about yourself  - - 0  Trouble concentrating - - 0  Moving slowly or fidgety/restless - - 2  Suicidal thoughts - - 0  PHQ-9 Score - - 2  Difficult doing work/chores - - Somewhat difficult  Some recent data might be hidden    Social History   Tobacco Use   Smoking status: Never   Smokeless tobacco: Never  Vaping Use   Vaping Use: Never used  Substance Use Topics   Alcohol use: Not Currently    Alcohol/week: 0.0 standard drinks   Drug use: No    Review of Systems Per HPI unless specifically indicated above     Objective:    BP (!) 142/82   Pulse 74   Ht '5\' 1"'$  (1.549 m)   Wt 269 lb 6.4 oz (122.2 kg)   SpO2 100%   BMI 50.90 kg/m   Wt Readings from Last 3 Encounters:  02/17/21 269 lb 6.4 oz (122.2 kg)  11/09/20 272 lb (123.4 kg)  08/11/20 260 lb (117.9 kg)    Physical Exam Vitals and nursing note reviewed.  Constitutional:      General: She is not in acute distress.    Appearance: Normal appearance. She is well-developed. She is obese. She is not diaphoretic.     Comments: Well-appearing, comfortable, cooperative  HENT:      Head: Normocephalic and atraumatic.  Eyes:     General:        Right eye: No discharge.        Left eye: No discharge.     Conjunctiva/sclera: Conjunctivae normal.  Cardiovascular:     Rate and Rhythm: Normal rate.  Pulmonary:     Effort: Pulmonary effort is normal.  Skin:    General: Skin is warm and dry.     Findings: No erythema or rash.  Neurological:     Mental Status: She is alert and oriented to person, place, and time.  Psychiatric:        Mood and Affect: Mood normal.        Behavior: Behavior normal.        Thought Content: Thought content normal.     Comments: Well groomed, good eye contact, normal speech and thoughts     Results for orders placed or performed in visit on [id number] Basic metabolic panel  Result Value Ref Range   Glucose 81    Creatinine 0.9 0.5 - 1.1  Comprehensive metabolic panel  Result Value Ref Range   GFR calc non Af AWyvonnia Lora  93   Lipid panel  Result Value Ref Range   Triglycerides 118 40 - 160   Cholesterol 104 0 - 200   HDL 31 (A) 35 - 70   LDL Cholesterol 51   Hemoglobin A1c  Result Value Ref Range   Hemoglobin A1C 5.8       Assessment & Plan:   Problem List Items Addressed This Visit     Hypothyroidism - Primary   Relevant Medications   levothyroxine (SYNTHROID) 25 MCG tablet   Other Relevant Orders   TSH   T4, free   Other Visit Diagnoses     Morbid obesity with BMI of 50.0-59.9, adult (HCC)       Seborrheic dermatitis of scalp       Relevant Medications   fluocinonide (LIDEX) 0.05 % external solution       Hypothyroidism Check labs today TSH + T4, f/u results, adjust dose accordingly Refill Levothyroxine 35mg daily  Morbid obesity Counseling on diet recommendation calorie count / portion limitation goals for improving diet wt loss plan Completed BMI exemption form today, returned to patient.  Seborrheic dermatitis scalp vs psoriasis Trial on topical Fluocinonide ext solution apply BID for 2 weeks  PRN  Bradycardia May return to current Cardiology for follow-up. She used to be on propranolol for PACs but remains off medicine. And has problem now.  Meds ordered this encounter  Medications   levothyroxine (SYNTHROID) 25 MCG tablet    Sig: Take 1 tablet (25 mcg total) by mouth daily before breakfast.    Dispense:  90 tablet    Refill:  3   fluocinonide (LIDEX) 0.05 % external solution    Sig: Apply 1 application topically 2 (two) times daily as needed (eczema / dermatitis). For up to 2 weeks as needed    Dispense:  60 mL    Refill:  1      Follow up plan: Return in about 6 months (around 08/17/2021) for 6 month PreDM A1c, Weight management.    ANobie Putnam [facility] 02/17/2021, 11:27 AM

## 2021-02-17 NOTE — Patient Instructions (Addendum)
Thank you for coming to the office today.  Labs for Thyroid today. Stay tuned on results.  Re ordered Levothyroxine 11mg daily.  Call Cardiology to schedule, for bradycardia symptoms, let me know if need new referral.  Try to keep working on calorie restriction diet / carb reduced diet.  We can consider medication options going forward if needed as well. There are great options available. That can help.  Ozempic, Trulicity.  Please schedule a Follow-up Appointment to: Return in about 6 months (around 08/17/2021) for 6 month PreDM A1c, Weight management.  If you have any other questions or concerns, please feel free to call the office or send a message through [place] You may also schedule an earlier appointment if necessary.  Additionally, you may be receiving a survey about your experience at our office within a few days to 1 week by e-mail or mail. We value your feedback.  ANobie Putnam DO [facility]

## 2021-02-18 LAB — T4, FREE: Free T4: 1.2 ng/dL (ref 0.8–1.8)

## 2021-02-18 LAB — TSH: TSH: 2.19 mIU/L

## 2021-03-10 ENCOUNTER — Other Ambulatory Visit: Payer: Self-pay | Admitting: Obstetrics and Gynecology

## 2021-03-13 NOTE — Telephone Encounter (Signed)
Patient needs appointment prior to refill

## 2021-03-24 NOTE — Telephone Encounter (Signed)
Pt noticed mychart and mailed letter.

## 2021-04-07 ENCOUNTER — Encounter: Payer: Self-pay | Admitting: Cardiovascular Disease

## 2021-04-07 ENCOUNTER — Ambulatory Visit: Payer: Managed Care, Other (non HMO) | Admitting: Cardiovascular Disease

## 2021-04-07 ENCOUNTER — Other Ambulatory Visit: Payer: Self-pay

## 2021-04-07 VITALS — BP 122/88 | HR 75 | Ht 61.0 in | Wt 272.4 lb

## 2021-04-07 DIAGNOSIS — I493 Ventricular premature depolarization: Secondary | ICD-10-CM | POA: Diagnosis not present

## 2021-04-07 DIAGNOSIS — I10 Essential (primary) hypertension: Secondary | ICD-10-CM

## 2021-04-07 DIAGNOSIS — R002 Palpitations: Secondary | ICD-10-CM

## 2021-04-07 DIAGNOSIS — E785 Hyperlipidemia, unspecified: Secondary | ICD-10-CM

## 2021-04-07 MED ORDER — NEBIVOLOL HCL 5 MG PO TABS: 5.0000 mg | ORAL_TABLET | Freq: Every day | ORAL | 3 refills | Status: DC

## 2021-04-07 MED ORDER — PROPRANOLOL HCL 20 MG PO TABS: 20.0000 mg | ORAL_TABLET | Freq: Three times a day (TID) | ORAL | 3 refills | Status: DC | PRN

## 2021-04-07 NOTE — Progress Notes (Signed)
Cardiology Office Note  Date:  04/07/2021   ID:  Leah Osborne, DOB January 23, 1986, MRN [id number]  PCP:  Olin Hauser, DO   Chief Complaint  Patient presents with   Follow-up    Patient c/o chest pain with and without activity and occasional tachycardia & bradycardia as well. Medications reviewed by the patient verbally.     HPI:  Ms. Leah Osborne is a 35 year old woman with past medical history of HTN Prior hx of suicidal ideation migraine headaches, hypothyroidism, depression and anxiety Morbid obesity Flu in 07/2017 intermittent palpitations,  OSA, not on cpap suspected to be related to anxiety, hypothyroidism, caffeine,  who presents for follow-up of her intermittent palpitations  LOV 09/2017 Tele visit 2020  For paroxysmal tachycardia, saw Dr. Caryl Comes in January  2020 and propranolol was increased to propranolol LA 60mg  daily.   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,   Off propranolol  Over the past several months, most of this years had recurrent palpitations Feels tired, heart rate is low probably from PVCs also elevated at times Would like to restart beta-blocker  EKG personally reviewed by myself on todays visit Normal sinus rhythm rate 75 bpm no significant ST or T wave changes   PMH:   has a past medical history of Abnormal Pap smear of cervix, Arthritis, Arthritis, rheumatoid ([place]), Atypical chest pain, Bipolar 1 disorder ([place]), Headache, Hyperlipidemia, Hypertension, Morbid obesity ([place]), Palpitations, Persistent Nausea and vomiting, PTSD (post-traumatic stress disorder), PVC (premature ventricular contraction), Social phobia, and Stroke ([place]).  PSH:    Past Surgical History:  Procedure Laterality Date   CHOLECYSTECTOMY     LAPAROSCOPIC GASTRIC SLEEVE RESECTION   11/19/2018   LAPAROSCOPIC TUBAL LIGATION Bilateral 09/19/2015   Procedure: LAPAROSCOPIC BILATERAL TUBAL BANDING;  Surgeon: Brayton Mars, MD;  Location: [facility] ORS;  Service: Gynecology;  Laterality: Bilateral;    Current Outpatient Medications  Medication Sig Dispense Refill   aspirin 81 MG EC tablet Take 1 tablet (81 mg total) by mouth daily. Swallow whole. 90 tablet 3   atorvastatin (LIPITOR) 40 MG tablet Take 1 tablet (40 mg total) by mouth daily. 90 tablet 3   busPIRone (BUSPAR) 10 MG tablet Take 1 tablet (10 mg total) by mouth 3 (three) times daily. 270 tablet 0   fluocinonide (LIDEX) 0.05 % external solution Apply 1 application topically 2 (two) times daily as needed (eczema / dermatitis). For up to 2 weeks as needed 60 mL 1   lamoTRIgine (LAMICTAL) 150 MG tablet Take 1 tablet (150 mg total) by mouth daily. 90 tablet 0   levothyroxine (SYNTHROID) 25 MCG tablet Take 1 tablet (25 mcg total) by mouth daily before breakfast. 90 tablet 3   nebivolol (BYSTOLIC) 5 MG tablet Take 1 tablet (5 mg total) by mouth daily. May take an extra pill for tachycardia 100 tablet 3   propranolol (INDERAL) 20 MG tablet Take 1 tablet (20 mg total) by mouth 3 (three) times daily as needed. 180 tablet 3   spironolactone (ALDACTONE) 25 MG tablet TAKE 1 TABLET BY MOUTH  DAILY 90 tablet 0   traZODone (DESYREL) 50 MG tablet Take 1 tablet (50 mg total) by mouth at bedtime. 90 tablet 0   diazepam (VALIUM) 2 MG tablet Take 1 tablet (2 mg total) by mouth as needed for anxiety (Prior to MRI - may repeat  x1 after 30 minutes if needed). (Patient not taking: Reported on 04/07/2021) 2 tablet 0   No current facility-administered medications for this visit.    Allergies:   Patient has no known allergies.   Social History:  The patient  reports that she has never smoked. She has never used smokeless tobacco. She reports that she does not currently use alcohol. She reports that she does not use drugs.   Family History:    family history includes Atrial fibrillation in her paternal grandmother; Diabetes in her maternal grandmother; Healthy in her father; Heart Problems in her maternal grandfather; Heart attack in her maternal grandmother; Heart disease in her maternal grandmother; Heart failure in her maternal grandmother; Rheum arthritis in her mother; Stroke in her maternal grandfather and another family member.    Review of Systems: Review of Systems  Constitutional: Negative.   HENT: Negative.    Respiratory: Negative.    Cardiovascular:  Positive for palpitations.  Gastrointestinal: Negative.   Musculoskeletal: Negative.   Neurological: Negative.   Psychiatric/Behavioral: Negative.    All other systems reviewed and are negative.   PHYSICAL EXAM: VS:  BP 122/88 (BP Location: Left Arm, Patient Position: Sitting, Cuff Size: Large)   Pulse 75   Ht 5\' 1"  (1.549 m)   Wt 272 lb 6 oz (123.5 kg)   SpO2 98%   BMI 51.46 kg/m  , BMI Body mass index is 51.46 kg/m. Constitutional: Obese oriented to person, place, and time. No distress.  HENT:  Head: Grossly normal Eyes:  no discharge. No scleral icterus.  Neck: No JVD, no carotid bruits  Cardiovascular: Regular rate and rhythm, no murmurs appreciated Pulmonary/Chest: Clear to auscultation bilaterally, no wheezes or rails Abdominal: Soft.  no distension.  no tenderness.  Musculoskeletal: Normal range of motion Neurological:  normal muscle tone. Coordination normal. No atrophy Skin: Skin warm and dry Psychiatric: normal affect, pleasant   Recent Labs: 11/09/2020: BUN 13; Potassium 3.9; Sodium 141 01/16/2021: Creatinine 0.9 02/17/2021: TSH 2.19    Lipid Panel Lab Results  Component Value Date   CHOL 104 01/16/2021   HDL 31 (A) 01/16/2021   LDLCALC 51 01/16/2021   TRIG 118 01/16/2021      Wt Readings from Last 3 Encounters:  04/07/21 272 lb 6 oz (123.5 kg)  02/17/21 269 lb 6.4 oz (122.2 kg)  11/09/20 272 lb (123.4 kg)       ASSESSMENT AND  PLAN:  Paroxysmal atrial tachycardia (HCC) Previously seen by EP Prior history of paroxysmal tachycardia, PVCs Will start second generation beta-blocker, bystolic 5 mg with extra 5 mg as needed for palpitations Propranolol 10 to 20 mg as needed for breakthrough palpitations   hypertension Reasonable blood pressure, beta-blocker as above  Mixed hyperlipidemia Cholesterol is at goal on the current lipid regimen. No changes to the medications were made.  Palpitations Beta-blocker as above  Morbid obesity ([place]) We have encouraged continued exercise, careful diet management in an effort to lose weight.   Total encounter time more than 25 minutes  Greater than 50% was spent in counseling and coordination of care with the patient     Orders Placed This Encounter  Procedures   EKG 12-Lead      Signed, Esmond Plants, M.D., Ph.D. 04/07/2021  [place], [place]

## 2021-04-07 NOTE — Patient Instructions (Addendum)
Medication Instructions:  Please START Bystolic 5 mg take daily  May take an extra tab as needed  For tachycardia Propranolol 20 mg three times  a day as needed For tachycardiac   If you need a refill on your cardiac medications before your next appointment, please call your pharmacy.   Lab work: No new labs needed  Testing/Procedures: No new testing needed  Follow-Up: At [facility], you and your health needs are our priority.  As part of our continuing mission to provide you with exceptional heart care, we have created designated Provider Care Teams.  These Care Teams include your primary Cardiologist (physician) and Advanced Practice Providers (APPs -  Physician Assistants and Nurse Practitioners) who all work together to provide you with the care you need, when you need it.  You will need a follow up appointment as needed  Providers on your designated Care Team:   Murray Hodgkins, NP Christell Faith, PA-C Cadence Kathlen Mody, [place]  COVID-19 Vaccine Information can be found at: [email or web address].uk For questions related to vaccine distribution or appointments, please email [email or web address] or call [phone].

## 2021-05-16 ENCOUNTER — Encounter: Payer: Managed Care, Other (non HMO) | Admitting: Obstetrics and Gynecology

## 2021-05-25 ENCOUNTER — Other Ambulatory Visit: Payer: Self-pay

## 2021-05-25 ENCOUNTER — Encounter: Payer: Self-pay | Admitting: Obstetrics and Gynecology

## 2021-05-25 ENCOUNTER — Ambulatory Visit (INDEPENDENT_AMBULATORY_CARE_PROVIDER_SITE_OTHER): Payer: Managed Care, Other (non HMO) | Admitting: Obstetrics and Gynecology

## 2021-05-25 VITALS — BP 135/90 | HR 66 | Ht 61.0 in | Wt 274.7 lb

## 2021-05-25 DIAGNOSIS — Z6841 Body Mass Index (BMI) 40.0 and over, adult: Secondary | ICD-10-CM | POA: Diagnosis not present

## 2021-05-25 DIAGNOSIS — N808 Other endometriosis: Secondary | ICD-10-CM

## 2021-05-25 DIAGNOSIS — N3946 Mixed incontinence: Secondary | ICD-10-CM

## 2021-05-25 MED ORDER — MEDROXYPROGESTERONE ACETATE 10 MG PO TABS: 10.0000 mg | ORAL_TABLET | Freq: Every day | ORAL | 2 refills | Status: DC

## 2021-05-25 MED ORDER — LUPRON DEPOT (3-MONTH) 11.25 MG IM KIT: 11.2500 mg | PACK | INTRAMUSCULAR | 3 refills | Status: DC

## 2021-05-25 NOTE — Patient Instructions (Signed)
Urethral Vaginal Sling A urethral vaginal sling procedure is surgery to correct urinary incontinence. Urinary incontinence is passing urine without one's control. It is common in older women and in women who have had children. In this surgery, a strong piece of material is placed under the tube that drains the bladder (urethra). This sling is made of tension-free vaginal tape or nylon mesh. It fits under the urethra like a hammock. The sling is put in position to straighten, support, and hold the urethra in its normal position. Tell a health care provider about: Any allergies you have. All medicines you are taking, including vitamins, herbs, eye drops, creams, and over-the-counter medicines. Any problems you or family members have had with anesthetic medicines. Any blood disorders you have. Any surgeries you have had. Any medical conditions you have. Whether you are pregnant or may be pregnant. What are the risks? Generally, this is a safe procedure. However, problems may occur, including: Infection. Excessive bleeding. Allergic reactions to medicines. Damage to nearby structures or organs. Problems urinating for several days or weeks. Return of the urinary incontinence. Mesh failure. Be sure to talk with your health care provider about the options you have for sling material and the risks associated with each material. What happens before the procedure? Staying hydrated Follow instructions from your health care provider about hydration, which may include: Up to 2 hours before the procedure - you may continue to drink clear liquids, such as water, clear fruit juice, black coffee, and plain tea.  Eating and drinking restrictions Follow instructions from your health care provider about eating and drinking, which may include: 8 hours before the procedure - stop eating heavy meals or foods, such as meat, fried foods, or fatty foods. 6 hours before the procedure - stop eating light meals or  foods, such as toast or cereal. 6 hours before the procedure - stop drinking milk or drinks that contain milk. 2 hours before the procedure - stop drinking clear liquids. Medicines Ask your health care provider about: Changing or stopping your regular medicines. This is especially important if you are taking diabetes medicines or blood thinners. Taking medicines such as aspirin and ibuprofen. These medicines can thin your blood. Do not take these medicines unless your health care provider tells you to take them. Taking over-the-counter medicines, vitamins, herbs, and supplements. Surgery safety Ask your health care provider: How your surgery site will be marked. What steps will be taken to help prevent infection. These steps may include: Removing hair at the surgery site. Washing skin with a germ-killing soap. Taking antibiotic medicine. General instructions Do not use any products that contain nicotine or tobacco for at least 4 weeks before the procedure. These products include cigarettes, chewing tobacco, and vaping devices, such as e-cigarettes. If you need help quitting, ask your health care provider. Plan to have a responsible adult take you home from the hospital or clinic. What happens during the procedure? An IV will be inserted into one of your veins. You will be given one or both of the following: A medicine to make you fall asleep (general anesthetic). A medicine that is injected into your spine to numb the area below the injection site (spinal anesthetic). A catheter will be placed in your bladder to drain urine during the procedure. An incision will be made in your vagina and on the lower part of your abdomen. The sling material will be passed around your bladder neck and stitched (sutured) to the muscles to hold the urethra  in its normal position. The incisions will then be closed with sutures. The procedure may vary among health care providers and hospitals. What happens  after the procedure? Your blood pressure, heart rate, breathing rate, and blood oxygen level will be monitored until you leave the hospital or clinic. You will have a catheter in place to drain your bladder. This will stay in place until your bladder is working properly on its own. You may have a gauze packing in the vagina to prevent bleeding. This will be removed in 1-2 days. Summary A urethral vaginal sling procedure is surgery to treat urinary incontinence. In this surgery, a strong piece of material is placed under the urethra to hold it in its normal position. Follow instructions from your health care provider about eating and drinking before the procedure. After surgery, you will have a urinary catheter in place until your bladder works properly on its own again. This information is not intended to replace advice given to you by your health care provider. Make sure you discuss any questions you have with your health care provider. Document Revised: 01/08/2020 Document Reviewed: 01/08/2020 Elsevier Patient Education  [place].

## 2021-05-25 NOTE — Progress Notes (Signed)
    GYNECOLOGY PROGRESS NOTE  Subjective:    Patient ID: Leah Osborne, female    DOB: Jan 23, 1986, 35 y.o.   MRN: [id number]  HPI  Patient is a 35 y.o. G2P2002 female who presents for discussion of bladder issues.  She notes a history of mixed urinary incontinence.  Has tried use of pessary in the past which helped some.  Desires to discuss bladder tack.  Notes that she does not desire future fertility, has already had a tubal ligation.   Additionally, she presented for medication refill.  Previously was on [place] for management of endometriosis, however due to insurance and cost, has been out of her medication for several months.  Cycles are now lasting 14 days, with associated cramping, clots, and headaches. Previously was also noting constipation and fatigue. Desires to discuss other options.    The following portions of the patient's history were reviewed and updated as appropriate: allergies, current medications, past family history, past medical history, past social history, past surgical history, and problem list.  Review of Systems Pertinent items noted in HPI and remainder of comprehensive ROS otherwise negative.   Objective:   Blood pressure 135/90, pulse 66, height 5\' 1"  (1.549 m), weight 274 lb 11.2 oz (124.6 kg), last menstrual period 05/12/2021. Body mass index is 51.9 kg/m. General appearance: alert and no distress Abdomen: soft, non-tender; bowel sounds normal; no masses,  no organomegaly Pelvic: external genitalia normal, rectovaginal septum normal.  Vagina without discharge.  Grade 1 rectocele present. Cervix normal appearing, no lesions and no motion tenderness.  Uterus mobile, nontender, normal shape and size.  Adnexae non-palpable, nontender bilaterally.  Extremities: extremities normal, atraumatic, no cyanosis or edema Neurologic: Grossly normal   Assessment:   1. Mixed stress and urge urinary incontinence   2. Other endometriosis   3. Morbid obesity with  BMI of 50.0-59.9, adult ([place])      Plan:   Mixed urinary incontinence - Discussed treatment options. Advised that patient does not have bladder prolapse as she thought. Mild rectocele noted. Discussed that her BMI may also have a role in her incontinence.  [place].  Patient reports a h/o bariatric surgery.  She had lost weight, however during [place] had regained weight.  Is working on weight loss again. Would recommend referral to Urology for further discussion of options.  Other endometriosis - Has tried several options in the past. Liked the [place] however having issues with insurance coverage. Discussed other options for management of her symptoms, including Depo Lupron, other contraceptives such as Mirena IUD (had issues with this in the past), Depo Provera (but concerns for further weight gain), or Danazol. Patient would like to try Lupron.  Medication prescribed.   A total of 25 minutes were spent face-to-face with the patient during this encounter and over half of that time involved counseling and coordination of care.

## 2021-05-27 ENCOUNTER — Encounter: Payer: Self-pay | Admitting: Obstetrics and Gynecology

## 2021-06-05 ENCOUNTER — Other Ambulatory Visit: Payer: Self-pay

## 2021-06-05 MED ORDER — LUPRON DEPOT (3-MONTH) 11.25 MG IM KIT: 11.2500 mg | PACK | INTRAMUSCULAR | 3 refills | Status: DC

## 2021-06-15 ENCOUNTER — Other Ambulatory Visit: Payer: Self-pay | Admitting: Obstetrics and Gynecology

## 2021-06-22 ENCOUNTER — Ambulatory Visit (INDEPENDENT_AMBULATORY_CARE_PROVIDER_SITE_OTHER): Payer: Managed Care, Other (non HMO) | Admitting: Obstetrics and Gynecology

## 2021-06-22 ENCOUNTER — Encounter: Payer: Self-pay | Admitting: Obstetrics and Gynecology

## 2021-06-22 ENCOUNTER — Other Ambulatory Visit: Payer: Self-pay

## 2021-06-22 VITALS — BP 136/68 | HR 78 | Ht 61.0 in | Wt 280.2 lb

## 2021-06-22 DIAGNOSIS — N3946 Mixed incontinence: Secondary | ICD-10-CM | POA: Diagnosis not present

## 2021-06-22 DIAGNOSIS — Z6841 Body Mass Index (BMI) 40.0 and over, adult: Secondary | ICD-10-CM | POA: Diagnosis not present

## 2021-06-22 DIAGNOSIS — N808 Other endometriosis: Secondary | ICD-10-CM | POA: Diagnosis not present

## 2021-06-22 MED ORDER — SPIRONOLACTONE 25 MG PO TABS: 25.0000 mg | ORAL_TABLET | Freq: Every day | ORAL | 3 refills | Status: DC

## 2021-06-22 MED ORDER — MIRABEGRON ER 50 MG PO TB24: 50.0000 mg | ORAL_TABLET | Freq: Every day | ORAL | 1 refills | Status: DC

## 2021-06-22 MED ORDER — LEUPROLIDE ACETATE (3 MONTH) 11.25 MG IM KIT
11.2500 mg | PACK | Freq: Once | INTRAMUSCULAR | Status: AC
  Administered 2021-06-22: 11.25 mg via INTRAMUSCULAR

## 2021-06-22 NOTE — Progress Notes (Signed)
° ° °  GYNECOLOGY PROGRESS NOTE  Subjective:    Patient ID: Leah Osborne, female    DOB: 1985-09-11, 36 y.o.   MRN: [id number]  HPI  Patient is a 36 y.o. G18P2002 female who presents for initial Lupron injection for management of endometriosis, and further discussion of surgery for her mixed incontinence.  Currently denies major complaints today.  Patient was referred to urogynecologist at her last visit, however notes that her appointment is not for another 2 weeks.  The following portions of the patient's history were reviewed and updated as appropriate: allergies, current medications, past family history, past medical history, past social history, past surgical history, and problem list.  Review of Systems Pertinent items noted in HPI and remainder of comprehensive ROS otherwise negative.   Objective:   Blood pressure 136/68, pulse 78, height 5\' 1"  (1.549 m), weight 280 lb 3.2 oz (127.1 kg), last menstrual period 06/17/2021. Body mass index is 52.94 kg/m. General appearance: alert and no distress Remainder of exam deferred  Assessment:   1. Other endometriosis      Plan:   1. Other endometriosis - leuprolide (LUPRON) injection 11.25 mg.  Discussed possible side effects. - Can consider laparoscopy for excision of possible endometriosis implants if surgical intervention is undertaken by Urogyn for her incontinence.  -To follow-up in 3 months for reassessment of symptoms after initial trial of Lupron  2. Mixed stress and urge urinary incontinence -Pending urogynecology consult -Discussed options for management of urge component, will initiate on Myrbetriq (see orders).  Can follow-up at [facility] appointment  3. Morbid obesity with BMI of 50.0-59.9, adult ([place]) -Previously discussed how this can be a factor with her incontinence.  Patient notes that she is currently working on managing.    Rubie Maid, MD [facility]

## 2021-06-22 NOTE — Patient Instructions (Signed)
Leuprolide injection What is this medication? LEUPROLIDE (loo PROE lide) is a man-made hormone. It is used to treat the symptoms of prostate cancer. This medicine may also be used to treat children with early onset of puberty. It may be used for other hormonal conditions. This medicine may be used for other purposes; ask your health care provider or pharmacist if you have questions. COMMON BRAND NAME(S): Lupron What should I tell my care team before I take this medication? They need to know if you have any of these conditions: diabetes heart disease or previous heart attack high blood pressure high cholesterol pain or difficulty passing urine spinal cord metastasis stroke tobacco smoker an unusual or allergic reaction to leuprolide, benzyl alcohol, other medicines, foods, dyes, or preservatives pregnant or trying to get pregnant breast-feeding How should I use this medication? This medicine is for injection under the skin or into a muscle. You will be taught how to prepare and give this medicine. Use exactly as directed. Take your medicine at regular intervals. Do not take your medicine more often than directed. It is important that you put your used needles and syringes in a special sharps container. Do not put them in a trash can. If you do not have a sharps container, call your pharmacist or healthcare provider to get one. A special MedGuide will be given to you by the pharmacist with each prescription and refill. Be sure to read this information carefully each time. Talk to your pediatrician regarding the use of this medicine in children. While this medicine may be prescribed for children as young as 8 years for selected conditions, precautions do apply. Overdosage: If you think you have taken too much of this medicine contact a poison control center or emergency room at once. NOTE: This medicine is only for you. Do not share this medicine with others. What if I miss a dose? If you miss  a dose, take it as soon as you can. If it is almost time for your next dose, take only that dose. Do not take double or extra doses. What may interact with this medication? Do not take this medicine with any of the following medications: chasteberry cisapride dronedarone pimozide thioridazine This medicine may also interact with the following medications: herbal or dietary supplements, like black cohosh or DHEA female hormones, like estrogens or progestins and birth control pills, patches, rings, or injections female hormones, like testosterone other medicines that prolong the QT interval (abnormal heart rhythm) This list may not describe all possible interactions. Give your health care provider a list of all the medicines, herbs, non-prescription drugs, or dietary supplements you use. Also tell them if you smoke, drink alcohol, or use illegal drugs. Some items may interact with your medicine. What should I watch for while using this medication? Visit your doctor or health care professional for regular checks on your progress. During the first week, your symptoms may get worse, but then will improve as you continue your treatment. You may get hot flashes, increased bone pain, increased difficulty passing urine, or an aggravation of nerve symptoms. Discuss these effects with your doctor or health care professional, some of them may improve with continued use of this medicine. Female patients may experience a menstrual cycle or spotting during the first 2 months of therapy with this medicine. If this continues, contact your doctor or health care professional. This medicine may increase blood sugar. Ask your healthcare provider if changes in diet or medicines are needed if you have  diabetes. What side effects may I notice from receiving this medication? Side effects that you should report to your doctor or health care professional as soon as possible: allergic reactions like skin rash, itching or  hives, swelling of the face, lips, or tongue breathing problems chest pain depression or memory disorders pain in your legs or groin pain at site where injected severe headache signs and symptoms of high blood sugar such as being more thirsty or hungry or having to urinate more than normal. You may also feel very tired or have blurry vision swelling of the feet and legs visual changes vomiting Side effects that usually do not require medical attention (report to your doctor or health care professional if they continue or are bothersome): breast swelling or tenderness decrease in sex drive or performance diarrhea hot flashes loss of appetite muscle, joint, or bone pains nausea redness or irritation at site where injected skin problems or acne This list may not describe all possible side effects. Call your doctor for medical advice about side effects. You may report side effects to FDA at 1-800-FDA-1088. Where should I keep my medication? Keep out of the reach of children. Store below 25 degrees C (77 degrees F). Do not freeze. Protect from light. Do not use if it is not clear or if there are particles present. Throw away any unused medicine after the expiration date. NOTE: This sheet is a summary. It may not cover all possible information. If you have questions about this medicine, talk to your doctor, pharmacist, or health care provider.  2022 Elsevier/Gold Standard (2021-02-21 00:00:00)

## 2021-06-26 ENCOUNTER — Encounter: Payer: Self-pay | Admitting: Cardiovascular Disease

## 2021-07-07 ENCOUNTER — Encounter: Payer: Self-pay | Admitting: Pharmacist

## 2021-07-12 ENCOUNTER — Other Ambulatory Visit: Payer: Self-pay

## 2021-07-12 ENCOUNTER — Encounter: Payer: Self-pay | Admitting: Obstetrics and Gynecology

## 2021-07-12 ENCOUNTER — Ambulatory Visit: Payer: Managed Care, Other (non HMO) | Admitting: Obstetrics and Gynecology

## 2021-07-12 VITALS — BP 149/83 | HR 85 | Ht 60.0 in | Wt 280.0 lb

## 2021-07-12 DIAGNOSIS — N3281 Overactive bladder: Secondary | ICD-10-CM

## 2021-07-12 DIAGNOSIS — N816 Rectocele: Secondary | ICD-10-CM | POA: Diagnosis not present

## 2021-07-12 DIAGNOSIS — R350 Frequency of micturition: Secondary | ICD-10-CM

## 2021-07-12 DIAGNOSIS — N393 Stress incontinence (female) (male): Secondary | ICD-10-CM | POA: Diagnosis not present

## 2021-07-12 LAB — POCT URINALYSIS DIPSTICK
Appearance: NORMAL
Bilirubin, UA: NEGATIVE
Glucose, UA: NEGATIVE
Ketones, UA: NEGATIVE
Leukocytes, UA: NEGATIVE
Nitrite, UA: NEGATIVE
Protein, UA: NEGATIVE
Spec Grav, UA: 1.025 (ref 1.010–1.025)
Urobilinogen, UA: 0.2 E.U./dL
pH, UA: 7 (ref 5.0–8.0)

## 2021-07-12 MED ORDER — MIRABEGRON ER 50 MG PO TB24: 50.0000 mg | ORAL_TABLET | Freq: Every day | ORAL | 1 refills | Status: DC

## 2021-07-12 NOTE — Progress Notes (Signed)
[facility] New Patient Evaluation and Consultation  Referring Provider: Rubie Maid, MD PCP: Olin Hauser, DO Date of Service: 07/12/2021  SUBJECTIVE Chief Complaint: New Patient (Initial Visit)  History of Present Illness: Leah Osborne is a 36 y.o. White or Caucasian female seen in consultation at the request of Dr. Marcelline Mates for evaluation of incontinence   Review of records from Dr Marcelline Mates significant for: Has both mixed stress and urgency. Started Myrbetriq for urge symptoms. Currently on Lupron for endometriosis. Has a history of bariatric surgery.   Urinary Symptoms: Leaks urine with cough/ sneeze, laughing, exercise, lifting, going from sitting to standing, with a full bladder, with movement to the bathroom, with urgency, without sensation, and while asleep Has been worsening over the last few years. UUI > SUI. Leaks even with sitting. But leakage is also bad with cough.  Leaks 3 time(s) per  hour .  Pad use: 3-4 pads per day.   She is bothered by her UI symptoms. Started Myrbetriq 50mg  daily. She feels like it has been helping with the urgency and that she is able to empty her bladder better.  Has also tried a pessary and physical therapy and did not see improvement.   Day time voids 4-5.  Nocturia: 2 times per night to void. Voiding dysfunction: she does not empty her bladder well.  does not use a catheter to empty bladder.  When urinating, she feels the need to urinate multiple times in a row and to push on her belly or vagina to empty bladder Drinks: 1 cup coffee, milk, flavored water   UTIs:  0  UTI's in the last year.   Reports history of kidney stones  Pelvic Organ Prolapse Symptoms:                  She Denies a feeling of a bulge the vaginal area.   Bowel Symptom: Bowel movements: 1-3 time(s) per day Stool consistency: soft  Straining: no.  Splinting: no.  Incomplete evacuation: no.  She Denies accidental bowel leakage / fecal  incontinence Bowel regimen: none  Sexual Function Sexually active: yes.  Pain with sex: No  Pelvic Pain Admits to pelvic pain Location: lower abdomen Pain occurs: sitting to standing, walking Prior pain treatment: for endometriosis- Orillisa, Lupron, spironolactone Improved by: heatig pad, pain relievers, muscle relaxers Worsened by: standing, activity, straining   Past Medical History:  Past Medical History:  Diagnosis Date   Abnormal Pap smear of cervix    Arthritis    Arthritis, rheumatoid (HCC)    Atypical chest pain    Bipolar 1 disorder ([place])    Headache    MIGRAINES   Hyperlipidemia    Hypertension    Morbid obesity ([place])    a. 11/2018 s/p Robotic Sleeve Gastrectomy @ [facility].   Palpitations    a. 09/2017 Zio Monitor: predominantly RSR, avg HR 87, 11 A tach/SVT episodes, fastest 176 x 7 beats, longest 9 beats. Rare PAC's, occas PVC's, rare couplets/triplets.   Persistent Nausea and vomiting    a. Following sleeve gastrectomy 11/2018   PTSD (post-traumatic stress disorder)    PVC (premature ventricular contraction)    Social phobia    Stroke [facility])      Past Surgical History:   Past Surgical History:  Procedure Laterality Date   CHOLECYSTECTOMY     LAPAROSCOPIC GASTRIC SLEEVE RESECTION  11/19/2018   LAPAROSCOPIC TUBAL LIGATION Bilateral 09/19/2015   Procedure: LAPAROSCOPIC BILATERAL TUBAL BANDING;  Surgeon: Alanda Slim Defrancesco,  MD;  Location: [facility] ORS;  Service: Gynecology;  Laterality: Bilateral;     Past OB/GYN History: OB History  Gravida Para Term Preterm AB Living  2 2 2  0 0 2  SAB IAB Ectopic Multiple Live Births  0 0 0 0 2    # Outcome Date GA Lbr Len/2nd Weight Sex Delivery Anes PTL Lv  2 Term 2016   8 lb 1.6 oz (3.674 kg) F Vag-Spont   LIV  1 Term 2013   5 lb 2.2 oz (2.331 kg) M Vag-Spont   LIV  Patient's last menstrual period was 06/07/2021.  Contraception: tubal ligation Last pap smear was 2020- negative.     Medications: She has a current  medication list which includes the following prescription(s): aspirin, atorvastatin, buspirone, lamotrigine, lupron depot (72-month), levothyroxine, medroxyprogesterone, mirabegron er, nebivolol, propranolol, spironolactone, and trazodone.   Allergies: Patient has No Known Allergies.   Social History:  Social History   Tobacco Use   Smoking status: Never   Smokeless tobacco: Never  Vaping Use   Vaping Use: Never used  Substance Use Topics   Alcohol use: Not Currently    Alcohol/week: 0.0 standard drinks   Drug use: No    Relationship status: married She lives with spouse and children.   She is employed in Therapist, art. Regular exercise: No History of abuse: No  Family History:   Family History  Problem Relation Age of Onset   Diabetes Maternal Grandmother    Heart disease Maternal Grandmother    Heart failure Maternal Grandmother    Heart attack Maternal Grandmother    Rheum arthritis Mother    Healthy Father    Heart Problems Maternal Grandfather    Stroke Maternal Grandfather    Atrial fibrillation Paternal Grandmother    Stroke Other        mat great gf had stroke at 77   Cancer Neg Hx      Review of Systems: Review of Systems  Constitutional:  Positive for malaise/fatigue. Negative for fever and weight loss.  Respiratory:  Negative for cough, shortness of breath and wheezing.   Cardiovascular:  Positive for palpitations. Negative for chest pain and leg swelling.  Gastrointestinal:  Positive for abdominal pain. Negative for blood in stool.  Genitourinary:  Negative for dysuria.  Musculoskeletal:  Negative for myalgias.  Skin:  Negative for rash.  Neurological:  Positive for dizziness and headaches.  Endo/Heme/Allergies:  Bruises/bleeds easily.  Psychiatric/Behavioral:  Positive for depression. The patient is nervous/anxious.     OBJECTIVE Physical Exam: Vitals:   07/12/21 1137  BP: (!) 149/83  Pulse: 85  Weight: 280 lb (127 kg)  Height: 5' (1.524 m)     Physical Exam Constitutional:      General: She is not in acute distress. Pulmonary:     Effort: Pulmonary effort is normal.  Abdominal:     General: There is no distension.     Palpations: Abdomen is soft.     Tenderness: There is no abdominal tenderness. There is no rebound.  Musculoskeletal:        General: No swelling. Normal range of motion.  Skin:    General: Skin is warm and dry.     Findings: No rash.  Neurological:     Mental Status: She is alert and oriented to person, place, and time.  Psychiatric:        Mood and Affect: Mood normal.        Behavior: Behavior normal.  GU / Detailed Urogynecologic Evaluation:  Pelvic Exam: Normal external female genitalia; Bartholin's and Skene's glands normal in appearance; urethral meatus normal in appearance, no urethral masses or discharge.   CST: negative  Speculum exam reveals normal vaginal mucosa without atrophy. Cervix normal appearance. Blood present in vaginal vault. Uterus normal single, nontender. Adnexa not palpable  Pelvic floor strength II/V  Pelvic floor musculature: Right levator tender, Right obturator tender, Left levator tender, Left obturator non-tender  POP-Q:   POP-Q  -2                                            Aa   -2                                           Ba  -8                                              C   3                                            Gh  5                                            Pb  11                                            tvl   -1                                            Ap  -1                                            Bp  -9                                              D     Rectal Exam:  Normal external rectum  Post-Void Residual (PVR) by Bladder Scan: In order to evaluate bladder emptying, we discussed obtaining a postvoid residual and she agreed to this procedure.  Procedure: The ultrasound unit was placed on the patient's abdomen  in the suprapubic region after the patient had voided. A PVR of 135 ml was obtained by bladder scan. Straight catheterization performed for PVR and only 19ml urine was obtained- normal PVR.   Laboratory Results: POC urine: moderate blood (currently menstruating)   ASSESSMENT AND PLAN Ms. Zamor is  a 36 y.o. with:  1. Overactive bladder   2. Urinary frequency   3. SUI (stress urinary incontinence, female)   4. Prolapse of posterior vaginal wall    OAB - We discussed the symptoms of overactive bladder (OAB), which include urinary urgency, urinary frequency, nocturia, with or without urge incontinence.  While we do not know the exact etiology of OAB, several treatment options exist. We discussed management including behavioral therapy (decreasing bladder irritants, urge suppression strategies, timed voids, bladder retraining), physical therapy, medication; for refractory cases posterior tibial nerve stimulation, sacral neuromodulation, and intravesical botulinum toxin injection.  She has seen some good improvement in her symptoms with Myrbetriq 50mg  and would like to continue. Refills provided, and coupon given since it costs her $150 for the month.   2. SUI - For treatment of stress urinary incontinence,  non-surgical options include expectant management, weight loss, physical therapy, as well as a pessary.  Surgical options include a midurethral sling, and transurethral injection of a bulking agent. - We discussed that a sling may not be the best option for her due to her history of pelvic pain from endometriosis. If she continued to have pain there may be a concern that it was due to the sling. She is also concerned about need for a catheter.  Therefore, recommended urethral bulking with Bulkamid as an option for her. Handouts provided on this option. - Would prefer Bulkamid be done in the operating room. Considering surgery with Dr Marcelline Mates and can be done as a co-surgery.   Return for  simple CMG testing to demonstrate leakage.   Jaquita Folds, MD

## 2021-07-19 ENCOUNTER — Ambulatory Visit: Payer: Self-pay | Admitting: *Deleted

## 2021-07-19 NOTE — Telephone Encounter (Signed)
Call to patient- patient reports she had tele health visit and was able to get medications (antibiotic prescribed). She has canceled her appointment with PCP for tomorrow.

## 2021-07-19 NOTE — Telephone Encounter (Signed)
Summary: diarrhea, vomiting, nausea, and constipation.   Pt stated that she has had a cold for about two and a half weeks.Pt stated she has been experiencing diarrhea, vomiting, nausea, and constipation.    Pt seeking clinical advice.     Per pt request I schedule her an appointment virtual 07/20/21 11: 20 with Dr. Parks Ranger     Attempted to call patient regarding symptoms- left message to call office.

## 2021-07-20 ENCOUNTER — Telehealth: Payer: Managed Care, Other (non HMO) | Admitting: Family Medicine

## 2021-07-21 ENCOUNTER — Encounter: Payer: Self-pay | Admitting: Internal Medicine

## 2021-07-21 ENCOUNTER — Telehealth (INDEPENDENT_AMBULATORY_CARE_PROVIDER_SITE_OTHER): Payer: Managed Care, Other (non HMO) | Admitting: Internal Medicine

## 2021-07-21 ENCOUNTER — Encounter: Payer: Self-pay | Admitting: Family Medicine

## 2021-07-21 DIAGNOSIS — J0100 Acute maxillary sinusitis, unspecified: Secondary | ICD-10-CM

## 2021-07-21 DIAGNOSIS — J0110 Acute frontal sinusitis, unspecified: Secondary | ICD-10-CM

## 2021-07-21 MED ORDER — PREDNISONE 10 MG PO TABS: ORAL_TABLET | ORAL | 0 refills | Status: DC

## 2021-07-21 NOTE — Telephone Encounter (Signed)
Patient had virtual visit today with Webb Silversmith, FNP  Issue has been addressed  Nobie Putnam, [facility] 07/21/2021, 4:51 PM

## 2021-07-21 NOTE — Patient Instructions (Signed)

## 2021-07-21 NOTE — Progress Notes (Signed)
Virtual Visit via Video Note  I connected with Leah Osborne on 07/21/21 at  2:40 PM EST by a video enabled telemedicine application and verified that I am speaking with the correct person using two identifiers.  Location: Patient: Work Provider: Buyer, retail in this video call: Webb Silversmith, NP and [employer]   I discussed the limitations of evaluation and management by telemedicine and the availability of in person appointments. The patient expressed understanding and agreed to proceed.  History of Present Illness:  Pt reports headache, sinus pressure, nasal congestion, sore throat and body aches. This started 3 weeks ago. The headache is located in her forehead. She describes the pain as pressure. She is blowing green mucous out of her nose. She denies difficulty swallowing. The cough is productive of green mucous. She denies runny nose, ear pain, shortness of breath, fever or chills. She has tried Tylenol and Mucinex OTC with minimal relief of symptoms. She did a visit with [facility], was diagnosed with a sinus infection and prescribed Amoxicillin. She reports she typically needs Prednisone along with the abx to get rid of her sinus infections. She had a negative covid test.    Past Medical History:  Diagnosis Date   Abnormal Pap smear of cervix    Arthritis    Arthritis, rheumatoid (HCC)    Atypical chest pain    Bipolar 1 disorder (HCC)    Headache    MIGRAINES   Hyperlipidemia    Hypertension    Morbid obesity ([place])    a. 11/2018 s/p Robotic Sleeve Gastrectomy @ [facility].   Palpitations    a. 09/2017 Zio Monitor: predominantly RSR, avg HR 87, 11 A tach/SVT episodes, fastest 176 x 7 beats, longest 9 beats. Rare PAC's, occas PVC's, rare couplets/triplets.   Persistent Nausea and vomiting    a. Following sleeve gastrectomy 11/2018   PTSD (post-traumatic stress disorder)    PVC (premature ventricular contraction)    Social phobia    Stroke [facility])      Current Outpatient Medications  Medication Sig Dispense Refill   aspirin 81 MG EC tablet Take 1 tablet (81 mg total) by mouth daily. Swallow whole. 90 tablet 3   atorvastatin (LIPITOR) 40 MG tablet Take 1 tablet (40 mg total) by mouth daily. 90 tablet 3   busPIRone (BUSPAR) 10 MG tablet Take 1 tablet (10 mg total) by mouth 3 (three) times daily. 270 tablet 0   lamoTRIgine (LAMICTAL) 150 MG tablet Take 1 tablet (150 mg total) by mouth daily. 90 tablet 0   leuprolide (LUPRON DEPOT, 66-MONTH,) 11.25 MG injection Inject 11.25 mg into the muscle every 3 (three) months. 1 each 3   levothyroxine (SYNTHROID) 25 MCG tablet Take 1 tablet (25 mcg total) by mouth daily before breakfast. 90 tablet 3   medroxyPROGESTERone (PROVERA) 10 MG tablet Take 1 tablet (10 mg total) by mouth daily. Use for ten days 10 tablet 2   mirabegron ER (MYRBETRIQ) 50 MG TB24 tablet Take 1 tablet (50 mg total) by mouth daily. 30 tablet 1   nebivolol (BYSTOLIC) 5 MG tablet Take 1 tablet (5 mg total) by mouth daily. May take an extra pill for tachycardia 100 tablet 3   propranolol (INDERAL) 20 MG tablet Take 1 tablet (20 mg total) by mouth 3 (three) times daily as needed. 180 tablet 3   spironolactone (ALDACTONE) 25 MG tablet Take 1 tablet (25 mg total) by mouth daily. 90 tablet 3   traZODone (DESYREL) 50 MG  tablet Take 1 tablet (50 mg total) by mouth at bedtime. 90 tablet 0   No current facility-administered medications for this visit.    No Known Allergies  Family History  Problem Relation Age of Onset   Diabetes Maternal Grandmother    Heart disease Maternal Grandmother    Heart failure Maternal Grandmother    Heart attack Maternal Grandmother    Rheum arthritis Mother    Healthy Father    Heart Problems Maternal Grandfather    Stroke Maternal Grandfather    Atrial fibrillation Paternal Grandmother    Stroke Other        mat great gf had stroke at 23   Cancer Neg Hx     Social History   Socioeconomic History    Marital status: Married    Spouse name: Not on file   Number of children: Not on file   Years of education: Not on file   Highest education level: Not on file  Occupational History   Not on file  Tobacco Use   Smoking status: Never   Smokeless tobacco: Never  Vaping Use   Vaping Use: Never used  Substance and Sexual Activity   Alcohol use: Not Currently    Alcohol/week: 0.0 standard drinks   Drug use: No   Sexual activity: Yes    Birth control/protection: Surgical    Comment: tubal  Other Topics Concern   Not on file  Social History Narrative   L[place] in [place] with husband   Right handed   Social Determinants of Health   Financial Resource Strain: Not on file  Food Insecurity: Not on file  Transportation Needs: Not on file  Physical Activity: Not on file  Stress: Not on file  Social Connections: Not on file  Intimate Partner Violence: Not on file     Constitutional: Pt reports headache. Denies fever, malaise, fatigue, or abrupt weight changes.  HEENT: Pt reports sinus pressure, nasal congestion, sore throat. Denies eye pain, eye redness, ear pain, ringing in the ears, wax buildup, runny nose, bloody nose. Respiratory: Pt reports cough. Denies difficulty breathing, shortness of breath.   Cardiovascular: Denies chest pain, chest tightness, palpitations or swelling in the hands or feet.    No other specific complaints in a complete review of systems (except as listed in HPI above).    Observations/Objective:  Wt Readings from Last 3 Encounters:  07/12/21 280 lb (127 kg)  06/22/21 280 lb 3.2 oz (127.1 kg)  05/25/21 274 lb 11.2 oz (124.6 kg)    General: Appears her stated age, unwell but in NAD. HEENT: Head: normal shape and size; Nose: congestion noted; Throat/Mouth: hoarseness noted Pulmonary/Chest: Normal effort. No respiratory distress.  Neurological: Alert and oriented.   BMET    Component Value Date/Time   NA 141 11/09/2020 1516   NA 140  07/19/2011 2312   K 3.9 11/09/2020 1516   K 4.3 07/19/2011 2312   CL 104 11/09/2020 1516   CL 105 07/19/2011 2312   CO2 24 11/09/2020 1516   CO2 27 07/19/2011 2312   GLUCOSE 85 11/09/2020 1516   GLUCOSE 142 (H) 02/09/2019 1138   GLUCOSE 84 07/19/2011 2312   BUN 13 11/09/2020 1516   BUN 7 07/19/2011 2312   CREATININE 0.9 01/16/2021 0000   CREATININE 0.89 11/09/2020 1516   CREATININE 0.88 01/28/2019 1012   CALCIUM 9.5 11/09/2020 1516   CALCIUM 9.6 07/19/2011 2312   GFRNONAA 93 01/16/2021 0000   GFRNONAA 88 01/28/2019 1012   GFRAA  117 02/19/2019 1400   GFRAA 102 01/28/2019 1012    Lipid Panel     Component Value Date/Time   CHOL 104 01/16/2021 0000   CHOL 92 (L) 11/09/2020 1516   TRIG 118 01/16/2021 0000   HDL 31 (A) 01/16/2021 0000   HDL 28 (L) 11/09/2020 1516   CHOLHDL 3.3 11/09/2020 1516   CHOLHDL 5.1 01/16/2019 0535   VLDL 17 01/16/2019 0535   LDLCALC 51 01/16/2021 0000   LDLCALC 47 11/09/2020 1516   LDLCALC 93 10/14/2018 1125    CBC    Component Value Date/Time   WBC 7.0 02/19/2019 1400   WBC 10.0 02/09/2019 1138   RBC 4.65 02/19/2019 1400   RBC 4.88 02/09/2019 1138   HGB 13.4 02/19/2019 1400   HCT 41.7 02/19/2019 1400   PLT 380 02/19/2019 1400   MCV 90 02/19/2019 1400   MCV 86 07/19/2011 2312   MCH 28.8 02/19/2019 1400   MCH 28.9 02/09/2019 1138   MCHC 32.1 02/19/2019 1400   MCHC 31.6 02/09/2019 1138   RDW 15.3 02/19/2019 1400   RDW 13.3 07/19/2011 2312   LYMPHSABS 1.6 01/01/2019 1309   LYMPHSABS 2.8 10/05/2015 1626   LYMPHSABS 2.8 07/19/2011 2312   MONOABS 1.2 (H) 01/01/2019 1309   MONOABS 1.1 (H) 07/19/2011 2312   EOSABS 0.1 01/01/2019 1309   EOSABS 0.2 10/05/2015 1626   EOSABS 0.1 07/19/2011 2312   BASOSABS 0.0 01/01/2019 1309   BASOSABS 0.0 10/05/2015 1626   BASOSABS 0.1 07/19/2011 2312    Hgb A1C Lab Results  Component Value Date   HGBA1C 5.8 01/16/2021        Assessment and Plan:  Acute Maxillary Sinusitis:  Advised her to  take the abx as previously prescribed RX for Pred Taper x 6 days Encouraged rest and fluids  Return precautions discussed  Follow Up Instructions:    I discussed the assessment and treatment plan with the patient. The patient was provided an opportunity to ask questions and all were answered. The patient agreed with the plan and demonstrated an understanding of the instructions.   The patient was advised to call back or seek an in-person evaluation if the symptoms worsen or if the condition fails to improve as anticipated.    Webb Silversmith, NP

## 2021-08-01 ENCOUNTER — Encounter: Payer: Self-pay | Admitting: Family Medicine

## 2021-08-01 ENCOUNTER — Encounter: Payer: Self-pay | Admitting: Internal Medicine

## 2021-08-02 ENCOUNTER — Other Ambulatory Visit: Payer: Self-pay | Admitting: Obstetrics and Gynecology

## 2021-08-02 DIAGNOSIS — N3281 Overactive bladder: Secondary | ICD-10-CM

## 2021-08-02 MED ORDER — FLUTICASONE PROPIONATE 50 MCG/ACT NA SUSP: 2.0000 | Freq: Every day | NASAL | 3 refills | Status: DC

## 2021-08-02 MED ORDER — AZITHROMYCIN 250 MG PO TABS: ORAL_TABLET | ORAL | 0 refills | Status: DC

## 2021-08-09 ENCOUNTER — Ambulatory Visit: Payer: Managed Care, Other (non HMO) | Admitting: Obstetrics and Gynecology

## 2021-08-09 ENCOUNTER — Encounter: Payer: Self-pay | Admitting: Obstetrics and Gynecology

## 2021-08-09 ENCOUNTER — Other Ambulatory Visit: Payer: Self-pay

## 2021-08-09 VITALS — BP 104/73 | HR 80 | Ht 61.0 in | Wt 278.0 lb

## 2021-08-09 DIAGNOSIS — N393 Stress incontinence (female) (male): Secondary | ICD-10-CM

## 2021-08-09 DIAGNOSIS — N3281 Overactive bladder: Secondary | ICD-10-CM

## 2021-08-09 NOTE — Progress Notes (Signed)
[facility] Return Visit  SUBJECTIVE  History of Present Illness: Leah Osborne is a 36 y.o. female seen in follow-up for mixed urinary incontinence. She is here today for a simple CMG.    Past Medical History: Patient  has a past medical history of Abnormal Pap smear of cervix, Arthritis, Arthritis, rheumatoid ([place]), Atypical chest pain, Bipolar 1 disorder ([place]), Headache, Hyperlipidemia, Hypertension, Morbid obesity ([place]), Palpitations, Persistent Nausea and vomiting, PTSD (post-traumatic stress disorder), PVC (premature ventricular contraction), Social phobia, and Stroke ([place]).   Past Surgical History: She  has a past surgical history that includes Cholecystectomy; Laparoscopic tubal ligation (Bilateral, 09/19/2015); and Laparoscopic gastric sleeve resection (11/19/2018).   Medications: She has a current medication list which includes the following prescription(s): aspirin, atorvastatin, azithromycin, buspirone, fluticasone, lamotrigine, lupron depot (38-month), levothyroxine, medroxyprogesterone, myrbetriq, nebivolol, prednisone, propranolol, spironolactone, and trazodone.   Allergies: Patient has No Known Allergies.   Social History: Patient  reports that she has never smoked. She has never used smokeless tobacco. She reports that she does not currently use alcohol. She reports that she does not use drugs.      OBJECTIVE     Physical Exam: Vitals:   08/09/21 0823  BP: 104/73  Pulse: 80  Weight: 278 lb (126.1 kg)  Height: 5\' 1"  (1.549 m)   Gen: No apparent distress, A&O x 3.  Detailed Urogynecologic Evaluation:  Deferred. Prior exam showed:  POP-Q:    POP-Q   -2                                            Aa   -2                                           Ba   -8                                              C    3                                            Gh   5                                            Pb   11                                             tvl    -1                                            Ap   -1  Bp   -9                                              D        Verbal consent was obtained to perform simple CMG procedure:   Prolapse was reduced using 2 large cotton swabs. Urethra was prepped with betadine and a 66F catheter was placed and bladder was drained completely. The bladder was then backfilled with sterile water by gravity.  First sensation: 34 First Desire: 150 Strong Desire: 180 Capacity: 250 Detrusor overactivity was noted throughout filling.  Cough stress test was positive. Valsalva stress test was negative.  Catheter was replaced to empty the bladder.   Interpretation: CMG showed increased sensation, and decreased cystometric capacity. Findings positive for stress incontinence, positive for detrusor overactivity.    ASSESSMENT AND PLAN    Ms. Harrow is a 36 y.o. with:  1. SUI (stress urinary incontinence, female)   2. Overactive bladder    Plan for surgery: Exam under anesthesia, cystoscopy with urethral bulking  - We reviewed the patient's specific anatomic and functional findings, with the assistance of diagrams, and together finalized the above procedure.  - For her SUI, she would like to pursue urethral bulking. We discussed the risks and benefits of the procedure including pain, bleeding, infection, damage to surrounding areas and need for a catheter. We reviewed that this procedure will not change her overactive bladder symptoms.  - She is possibly planning a hysterectomy with Dr Marcelline Mates- has a follow up at the end of march to discuss. She wants to see if she can get an earlier appointment. If they decide to pursue surgery, will plan to do the bulking procedure at the same time. Otherwise we can plan for a separate procedure.  - Continue Myrbetriq for OAB symptoms.   - For preop Visit:  She is required to have a visit within 30 days of her  surgery.    - Medical clearance: not required - Anticoagulant use: No - Medicaid Hysterectomy form: n/a - Accepts blood transfusion: Yes - Expected length of stay: outpatient  Request sent for surgery scheduling.   Jaquita Folds, MD   Time spent: I spent 20 minutes dedicated to the care of this patient on the date of this encounter to include pre-visit review of records, face-to-face time with the patient and post visit documentation. Additional time was spent on the procedure.

## 2021-08-18 ENCOUNTER — Other Ambulatory Visit: Payer: Self-pay

## 2021-08-18 ENCOUNTER — Ambulatory Visit: Payer: Managed Care, Other (non HMO) | Admitting: Family Medicine

## 2021-08-18 ENCOUNTER — Encounter: Payer: Self-pay | Admitting: Family Medicine

## 2021-08-18 VITALS — BP 124/80 | HR 94 | Ht 61.0 in | Wt 284.2 lb

## 2021-08-18 DIAGNOSIS — Z6841 Body Mass Index (BMI) 40.0 and over, adult: Secondary | ICD-10-CM

## 2021-08-18 DIAGNOSIS — R7309 Other abnormal glucose: Secondary | ICD-10-CM | POA: Diagnosis not present

## 2021-08-18 DIAGNOSIS — L400 Psoriasis vulgaris: Secondary | ICD-10-CM

## 2021-08-18 LAB — POCT GLYCOSYLATED HEMOGLOBIN (HGB A1C): Hemoglobin A1C: 5.8 % — AB (ref 4.0–5.6)

## 2021-08-18 MED ORDER — WEGOVY 0.25 MG/0.5ML ~~LOC~~ SOAJ: 0.2500 mg | SUBCUTANEOUS | 0 refills | Status: DC

## 2021-08-18 NOTE — Progress Notes (Signed)
? ?Subjective:  ? ? Patient ID: Leah Osborne, female    DOB: January 25, 1986, 36 y.o.   MRN: [id number] ? ?Leah Osborne is a 36 y.o. female presenting on 08/18/2021 for Obesity and Prediabetes ? ? ?HPI ? ?Hypothyroidism ?On Levothyroxine 56mcg daily ?Needs lab thyroid today and refill ?  ?Morbid Obesity BMI >53 ?PreDM A1c ?Due today A1c, previous 5.8 ?Now more lifestyle intervention plans, with others on board, but problem still currently gaining weight. ?Previously on Phentermine, Metformin, Topamax ?  ?History of Bradycardia ?She had symptoms occasionally with slow heart rate and feels fatigued ?She was on Propranolol in past due to PACs. She has prior cardiologist. ? ?Back Pain flare ?recent flare can raise BP ? ?Psoriasis Scalp, involving face and arms ?Has not seen Dermatology ? ? ?Depression screen [facility] 2/9 08/18/2021 01/27/2019 01/08/2019  ?Decreased Interest 0 0 0  ?Down, Depressed, Hopeless 0 0 0  ?PHQ - 2 Score 0 0 0  ?Altered sleeping 0 - -  ?Tired, decreased energy 3 - -  ?Change in appetite 1 - -  ?Feeling bad or failure about yourself  0 - -  ?Trouble concentrating 1 - -  ?Moving slowly or fidgety/restless 0 - -  ?Suicidal thoughts 0 - -  ?PHQ-9 Score 5 - -  ?Difficult doing work/chores Not difficult at all - -  ?Some recent data might be hidden  ? ? ?Social History  ? ?Tobacco Use  ? Smoking status: Never  ? Smokeless tobacco: Never  ?Vaping Use  ? Vaping Use: Never used  ?Substance Use Topics  ? Alcohol use: Not Currently  ?  Alcohol/week: 0.0 standard drinks  ? Drug use: No  ? ? ?Review of Systems ?Per HPI unless specifically indicated above ? ?   ?Objective:  ?  ?BP 124/80 (Cuff Size: Large)   Pulse 94   Ht 5\' 1"  (1.549 m)   Wt 284 lb 3.2 oz (128.9 kg)   SpO2 98%   BMI 53.70 kg/m?   ?Wt Readings from Last 3 Encounters:  ?08/18/21 284 lb 3.2 oz (128.9 kg)  ?08/09/21 278 lb (126.1 kg)  ?07/12/21 280 lb (127 kg)  ?  ?Physical Exam ?Vitals and nursing note reviewed.  ?Constitutional:   ?    General: She is not in acute distress. ?   Appearance: Normal appearance. She is well-developed. She is obese. She is not diaphoretic.  ?   Comments: Well-appearing, comfortable, cooperative  ?HENT:  ?   Head: Normocephalic and atraumatic.  ?Eyes:  ?   General:     ?   Right eye: No discharge.     ?   Left eye: No discharge.  ?   Conjunctiva/sclera: Conjunctivae normal.  ?Cardiovascular:  ?   Rate and Rhythm: Normal rate.  ?Pulmonary:  ?   Effort: Pulmonary effort is normal.  ?Skin: ?   General: Skin is warm and dry.  ?   Findings: No erythema or rash.  ?Neurological:  ?   Mental Status: She is alert and oriented to person, place, and time.  ?Psychiatric:     ?   Mood and Affect: Mood normal.     ?   Behavior: Behavior normal.     ?   Thought Content: Thought content normal.  ?   Comments: Well groomed, good eye contact, normal speech and thoughts  ? ? ? ? ?Results for orders placed or performed in visit on 07/12/21  ?POCT Urinalysis Dipstick  ?Result Value Ref Range  ?  Color, UA Yellow   ? Clarity, UA Clear   ? Glucose, UA Negative Negative  ? Bilirubin, UA Negative   ? Ketones, UA Negative   ? Spec Grav, UA 1.025 1.010 - 1.025  ? Blood, UA Moderate   ? pH, UA 7.0 5.0 - 8.0  ? Protein, UA Negative Negative  ? Urobilinogen, UA 0.2 0.2 or 1.0 E.U./dL  ? Nitrite, UA Negative   ? Leukocytes, UA Negative Negative  ? Appearance Normal   ? Odor None   ? ?   ?Assessment & Plan:  ? ?Problem List Items Addressed This Visit   ? ? Elevated hemoglobin A1c  ? ?Other Visit Diagnoses   ? ? Morbid obesity with BMI of 50.0-59.9, adult ([place])    -  Primary  ? Relevant Medications  ? WEGOVY 0.25 MG/0.5ML SOAJ  ? Plaque psoriasis      ? Relevant Orders  ? Ambulatory referral to Dermatology  ? ?  ? ?Morbid Obesity BMI >53 ?Limited progress on lifestyle management ?Abnormal wt gain ?Past trial on phentermine, topamax, wt loss clinic [facility] ?Will pursue GLP1 therapy now, discussion today on Wegovy, given sample x 1 month - 1 box = 4 pens,  discuss dosing side effect benefits, she can try for 2 weeks contact me and we can order new rx would need PA and she can use copay card ? ?PreDM A1c 5.8 today, similar to previous. Encourage lifestyle management and GLP1 therapy ? ?Psoriasis - refer to Dermatology, [place] for further management ? ?Orders Placed This Encounter  ?Procedures  ? Ambulatory referral to Dermatology  ?  Referral Priority:   Routine  ?  Referral Type:   Consultation  ?  Referral Reason:   Specialty Services Required  ?  Requested Specialty:   Dermatology  ?  Number of Visits Requested:   1  ? POCT HgB A1C  ? ? ? ?Meds ordered this encounter  ?Medications  ? WEGOVY 0.25 MG/0.5ML SOAJ  ?  Sig: Inject 0.25 mg into the skin once a week.  ?  Dispense:  2 mL  ?  Refill:  0  ? ? ? ? ?Follow up plan: ?Return if symptoms worsen or fail to improve. ? ?Nobie Putnam, DO ?[facility] ?08/18/2021, 2:36 PM ?

## 2021-08-18 NOTE — Patient Instructions (Addendum)
Thank you for coming to the office today. ? ?Call insurance find cost and coverage of the following - check the following: ?- Drug Tier, Preferred List, On Formulary ?- All will require a "Prior Authorization" from [place] first, before you can find out the cost ?- Find out if there is "Step Therapy" (other medicines required before you can try these) ? ?Once you pick the one you want to try, let me know - we can get a sample ready IF we have it in stock. Then try it - and before running out of medicine, contact me back to order your Rx so we have time to get it processed. ? ?For Pre-Diabetes / Diabetes ?  ?1. Ozempic (Semaglutide injection) - start 0.25mg  weekly for 4 weeks then increase to 0.5mg  weekly, sample is 6 doses, re-use the same pen until empty, new needle each dose. ?  ?2. Trulicity (Dulaglutide) - once weekly 0.75 (likely we would start) and 1.5 max dose, sample is 2 doses, 1 dose per pen, each pen is a one time use, no visible needle, it is an auto-injector. ? ?---------- ? ?For Weight Loss / Obesity only ? ?Wegovy (same as Ozempic) weekly injection - start 0.25mg  weekly, 1 dose per pen, single use, auto-injector ? ?2. Saxenda - DAILY injection - start 0.6mg  injection DAILY, sample is 3 weeks, new needle each dose. ? ? ?3 benefits ?- 1 significantly reduced A1c sugar, and may be able to reduce or stop metformin in future ?- 2 reduced appetite and weight loss with good results ?- 3 cardiovascular risk reduction, less likely to have heart attack/stroke ? ?---------------------------------- ? ?Try the sample of the [facility] for 4 weeks. ? ?Recent Labs  ?  01/16/21 ?0000  ?HGBA1C 5.8  ? ? ? ?Please schedule a Follow-up Appointment to: Return if symptoms worsen or fail to improve. ? ?If you have any other questions or concerns, please feel free to call the office or send a message through [place]. You may also schedule an earlier appointment if necessary. ? ?Additionally, you may be receiving a survey about your  experience at our office within a few days to 1 week by e-mail or mail. We value your feedback. ? ?Nobie Putnam, DO ?[place] ?

## 2021-08-21 NOTE — Progress Notes (Signed)
? ? ?  GYNECOLOGY PROGRESS NOTE ? ?Subjective:  ? ? Patient ID: Leah Osborne, female    DOB: 07/12/1985, 36 y.o.   MRN: [id number] ? ?HPI ? Patient is a 36 y.o. [id number] female who presents to discuss hysterectomy for history of pelvic pain, menorrhagia, endometriosis (dx by symptomatology, no laparoscopy). Has utilized Lupron, as well as Teaching laboratory technician (which worked well, however insurance will no longer cover medication).  Also has issues with mixed urinary incontinence (stress more worrisome component), has been evaluated by Urology, had urodynamic studies with plans for urethral bulking.  ? ?The following portions of the patient's history were reviewed and updated as appropriate: allergies, current medications, past family history, past medical history, past social history, past surgical history, and problem list. ? ?Review of Systems ?Pertinent items noted in HPI and remainder of comprehensive ROS otherwise negative.  ? ?Objective:  ? Blood pressure (!) 142/84, pulse 79, resp. rate 16, height '5\' 1"'$  (1.549 m), weight 283 lb (128.4 kg), last menstrual period 06/27/2021. Body mass index is 53.47 kg/m?. ?General appearance: alert and no distress ?See H&P for remainder of exam.  ? ? ?Assessment:  ? ?1. Other endometriosis   ?2. Mixed stress and urge urinary incontinence   ?3. Morbid obesity with BMI of 50.0-59.9, adult ([place])   ?4. Pelvic pain   ?5. Preoperative exam for gynecologic surgery   ?  ? ?Plan:  ? ?Patient desires definitive management with hysterectomy.  I proposed doing a robotic-assisted total laparoscopic hysterectomy with bilateral salpingectomy.  No indication for oophorectomy unless significant disease process present.  She will also be scheduled to undergo cystoscopy with urethal bulking to be performed by UroGyn.  Patient agrees with this proposed surgery.  The risks of surgery were discussed in detail with the patient including but not limited to: bleeding which may require transfusion or  reoperation; infection which may require antibiotics; injury to bowel, bladder, ureters or other surrounding organs; need for additional procedures including laparotomy or subsequent procedures secondary to abnormal pathology; formation of adhesions; thromboembolic phenomenon; incisional problems and other postoperative/anesthesia complications.  Patient was also advised that she will be outpatient, unless issues with urinary retention require her to be admitted for observation; and expected recovery time after a hysterectomy is 6-8 weeks.  Patient was told that the likelihood that her condition and symptoms will be treated effectively with this surgical management was very high; the postoperative expectations were also discussed in detail. The patient also understands the alternative treatment options which were discussed in full. All questions were answered.  She was told that she will be contacted by our surgical scheduler regarding the time and date of her surgery; routine preoperative instructions will be given to her by the preoperative nursing team.  Routine postoperative instructions will be reviewed with the patient in detail after surgery. ; bleeding precautions were reviewed. Printed patient education handouts about the procedure was given to the patient to review at home. Surgery scheduled for 08/28/2021.  ? ? ?A total of 23 minutes were spent face-to-face with the patient during this encounter and over half of that time dealt with counseling and coordination of care. ? ? ?Rubie Maid, MD ?[facility] ? ?

## 2021-08-22 ENCOUNTER — Ambulatory Visit (INDEPENDENT_AMBULATORY_CARE_PROVIDER_SITE_OTHER): Payer: Managed Care, Other (non HMO) | Admitting: Obstetrics and Gynecology

## 2021-08-22 ENCOUNTER — Other Ambulatory Visit: Payer: Self-pay

## 2021-08-22 ENCOUNTER — Encounter: Payer: Self-pay | Admitting: Obstetrics and Gynecology

## 2021-08-22 VITALS — BP 142/84 | HR 79 | Resp 16 | Ht 61.0 in | Wt 283.0 lb

## 2021-08-22 DIAGNOSIS — R102 Pelvic and perineal pain: Secondary | ICD-10-CM

## 2021-08-22 DIAGNOSIS — Z01818 Encounter for other preprocedural examination: Secondary | ICD-10-CM

## 2021-08-22 DIAGNOSIS — Z6841 Body Mass Index (BMI) 40.0 and over, adult: Secondary | ICD-10-CM

## 2021-08-22 DIAGNOSIS — N3946 Mixed incontinence: Secondary | ICD-10-CM

## 2021-08-22 DIAGNOSIS — N808 Other endometriosis: Secondary | ICD-10-CM

## 2021-08-22 NOTE — H&P (View-Only) (Signed)
? ? ? ?GYNECOLOGY PREOPERATIVE HISTORY AND PHYSICAL  ? ?Subjective:  ?Leah Osborne is a 36 y.o. [id number] here for surgical management of suspected endometriosis, pelvic pain, menorrhagia, and mixed urinary incontinence.   Previously was on [place] for management of endometriosis, however due to insurance and cost, has been out of her medication for several months.  Cycles are now lasting 14 days, with associated cramping, clots, and headaches. Previously was also noting constipation and fatigue.  Has now initated Lupron for management but ultimately now desires definitive management with hysterectomy. Preoperative concerns include: morbid obesity ? ?Proposed surgery: Robotic-assisted total laparoscopic hysterectomy with bilateral salpingectomy. Also to have Cystoscopy with Urethral Bulking to be performed by UroGynecology (Dr. Wannetta Sender) ? ? ? ?Pertinent Gynecological History: ?Menses: flow is moderate, lasting up to 14 days.  ?Bleeding: dysfunctional uterine bleeding ?Contraception: tubal ligation ?Last pap: normal Date: 04/15/2019.  ? ? ?Past Medical History:  ?Diagnosis Date  ? Abnormal Pap smear of cervix   ? Arthritis   ? Arthritis, rheumatoid ([place])   ? Atypical chest pain   ? Bipolar 1 disorder ([place])   ? Headache   ? MIGRAINES  ? Hyperlipidemia   ? Hypertension   ? Morbid obesity ([place])   ? a. 11/2018 s/p Robotic Sleeve Gastrectomy @ [facility].  ? Palpitations   ? a. 09/2017 Zio Monitor: predominantly RSR, avg HR 87, 11 A tach/SVT episodes, fastest 176 x 7 beats, longest 9 beats. Rare PAC's, occas PVC's, rare couplets/triplets.  ? Persistent Nausea and vomiting   ? a. Following sleeve gastrectomy 11/2018  ? PTSD (post-traumatic stress disorder)   ? PVC (premature ventricular contraction)   ? Social phobia   ? Stroke [facility])   ? ? ?Past Surgical History:  ?Procedure Laterality Date  ? CHOLECYSTECTOMY    ? LAPAROSCOPIC GASTRIC SLEEVE RESECTION  11/19/2018  ? LAPAROSCOPIC TUBAL LIGATION Bilateral 09/19/2015  ?  Procedure: LAPAROSCOPIC BILATERAL TUBAL BANDING;  Surgeon: Brayton Mars, MD;  Location: [facility] ORS;  Service: Gynecology;  Laterality: Bilateral;  ? ? ?OB History  ?Gravida Para Term Preterm AB Living  ?'2 2 2 '$ 0 0 2  ?SAB IAB Ectopic Multiple Live Births  ?0 0 0 0 2  ?  ?# Outcome Date GA Lbr Len/2nd Weight Sex Delivery Anes PTL Lv  ?2 Term 2016   8 lb 1.6 oz (3.674 kg) F Vag-Spont   LIV  ?1 Term 2013   5 lb 2.2 oz (2.331 kg) M Vag-Spont   LIV  ? ? ?Family History  ?Problem Relation Age of Onset  ? Diabetes Maternal Grandmother   ? Heart disease Maternal Grandmother   ? Heart failure Maternal Grandmother   ? Heart attack Maternal Grandmother   ? Rheum arthritis Mother   ? Healthy Father   ? Heart Problems Maternal Grandfather   ? Stroke Maternal Grandfather   ? Atrial fibrillation Paternal Grandmother   ? Stroke Other   ?     mat great gf had stroke at 29  ? Cancer Neg Hx   ? ? ?Social History  ? ?Socioeconomic History  ? Marital status: Married  ?  Spouse name: Not on file  ? Number of children: Not on file  ? Years of education: Not on file  ? Highest education level: Not on file  ?Occupational History  ? Not on file  ?Tobacco Use  ? Smoking status: Never  ? Smokeless tobacco: Never  ?Vaping Use  ? Vaping Use: Never used  ?Substance and  Sexual Activity  ? Alcohol use: Not Currently  ?  Alcohol/week: 0.0 standard drinks  ? Drug use: No  ? Sexual activity: Yes  ?  Birth control/protection: Surgical  ?  Comment: tubal  ?Other Topics Concern  ? Not on file  ?Social History Narrative  ? Lives in [place] with husband  ? Right handed  ? ?Social Determinants of Health  ? ?Financial Resource Strain: Not on file  ?Food Insecurity: Not on file  ?Transportation Needs: Not on file  ?Physical Activity: Not on file  ?Stress: Not on file  ?Social Connections: Not on file  ?Intimate Partner Violence: Not on file  ? ? ?Current Outpatient Medications on File Prior to Visit  ?Medication Sig Dispense Refill  ? aspirin 81 MG EC  tablet Take 1 tablet (81 mg total) by mouth daily. Swallow whole. 90 tablet 3  ? atorvastatin (LIPITOR) 40 MG tablet Take 1 tablet (40 mg total) by mouth daily. 90 tablet 3  ? busPIRone (BUSPAR) 10 MG tablet Take 1 tablet (10 mg total) by mouth 3 (three) times daily. 270 tablet 0  ? fluticasone (FLONASE) 50 MCG/ACT nasal spray Place 2 sprays into both nostrils daily. Use for 4-6 weeks then stop and use seasonally or as needed. 16 g 3  ? lamoTRIgine (LAMICTAL) 150 MG tablet Take 1 tablet (150 mg total) by mouth daily. 90 tablet 0  ? leuprolide (LUPRON DEPOT, 22-MONTH,) 11.25 MG injection Inject 11.25 mg into the muscle every 3 (three) months. 1 each 3  ? levothyroxine (SYNTHROID) 25 MCG tablet Take 1 tablet (25 mcg total) by mouth daily before breakfast. 90 tablet 3  ? medroxyPROGESTERone (PROVERA) 10 MG tablet Take 1 tablet (10 mg total) by mouth daily. Use for ten days 10 tablet 2  ? MYRBETRIQ 50 MG TB24 tablet TAKE 1 TABLET BY MOUTH DAILY 60 tablet 5  ? spironolactone (ALDACTONE) 25 MG tablet Take 1 tablet (25 mg total) by mouth daily. 90 tablet 3  ? traZODone (DESYREL) 50 MG tablet Take 1 tablet (50 mg total) by mouth at bedtime. 90 tablet 0  ? WEGOVY 0.25 MG/0.5ML SOAJ Inject 0.25 mg into the skin once a week. 2 mL 0  ? ?No current facility-administered medications on file prior to visit.  ? ? ?No Known Allergies ? ? ?Review of Systems ?Constitutional: No recent fever/chills/sweats ?Respiratory: No recent cough/bronchitis ?Cardiovascular: No chest pain ?Gastrointestinal: No recent nausea/vomiting/diarrhea ?Genitourinary: No UTI symptoms ?Hematologic/lymphatic:No history of coagulopathy or recent blood thinner use  ?  ?Objective:  ? ?Blood pressure (!) 142/84, pulse 79, resp. rate 16, height '5\' 1"'$  (1.549 m), weight 283 lb (128.4 kg), last menstrual period 06/27/2021. ?CONSTITUTIONAL: Well-developed, well-nourished female in no acute distress.  ?HENT:  Normocephalic, atraumatic, External right and left ear normal.  Oropharynx is clear and moist ?EYES: Conjunctivae and EOM are normal. Pupils are equal, round, and reactive to light. No scleral icterus.  ?NECK: Normal range of motion, supple, no masses ?SKIN: Skin is warm and dry. No rash noted. Not diaphoretic. No erythema. No pallor. ?NEUROLOGIC: Alert and oriented to person, place, and time. Normal reflexes, muscle tone coordination. No cranial nerve deficit noted. ?PSYCHIATRIC: Normal mood and affect. Normal behavior. Normal judgment and thought content. ?CARDIOVASCULAR: Normal heart rate noted, regular rhythm ?RESPIRATORY: Effort and breath sounds normal, no problems with respiration noted ?ABDOMEN: Soft, nontender, nondistended. ?PELVIC: Deferred ?MUSCULOSKELETAL: Normal range of motion. No edema and no tenderness. 2+ distal pulses. ? ? ? ?Labs: ?Results for orders placed or  performed in visit on 08/18/21 (from the past 336 hour(s))  ?POCT HgB A1C  ? Collection Time: 08/18/21  5:10 PM  ?Result Value Ref Range  ? Hemoglobin A1C 5.8 (A) 4.0 - 5.6 %  ? ?Other labs pending for pre-op.  ? ? ?Imaging Studies: ?Pending ? ?Assessment:  ?  ?1. Other endometriosis   ?2. Mixed stress and urge urinary incontinence   ?3. Morbid obesity with BMI of 50.0-59.9, adult ([place])   ?4. Pelvic pain   ?5. Preoperative exam for gynecologic surgery   ? ? ?Plan:  ? ?Counseling: Procedure, risks, reasons, benefits and complications (including injury to bowel, bladder, major blood vessel, ureter, bleeding, possibility of transfusion, infection, or fistula formation) reviewed in detail. Likelihood of success in alleviating the patient's condition was discussed. Routine postoperative instructions will be reviewed with the patient and her family in detail after surgery.  The patient concurred with the proposed plan, giving informed written consent for the surgery.   ?Preop testing ordered. ?Instructions reviewed, including NPO after midnight. ?  ? ?Rubie Maid, MD ?[facility] ? ?

## 2021-08-22 NOTE — Patient Instructions (Signed)
Robotic Assisted Hysterectomy  This surgical method provides a high-powered 3-D view of the operating area, permits an extensive range of motion that is more precise the human hand and allows use of surgical instruments from angles and positions that would be difficult to otherwise achieve.  OVERVIEW What is robotic assisted hysterectomy? Robotic assisted hysterectomy is a type of surgery that uses surgeon-controlled robotic equipment to remove your uterus.  Hysterectomy is the surgical removal of the uterus. The uterus is a hollow, muscular organ located in a woman's pelvis. Having a hysterectomy ends menstruation and the ability to become pregnant. Depending on the reason for the surgery, a hysterectomy may also involve the removal of other organs and tissues, such as the ovaries and/or fallopian tubes.  A robotic assisted surgery system consists of two separate pieces of equipment. One robotic piece of equipment is located next to the patient in the operating room. This robotic piece has four arms, which are long thin tubes that are attached to either a thin surgical instrument or a tiny camera. The surgical instruments and camera enter the patient's body through small  inch cuts (incisions) in the abdomen.  A short distance away from the operating table, the surgeon is seated in front of a separate computerized piece of equipment that looks like a video game. The surgeon controls the movements of the robotic arms and instruments with hand-held controls. The surgeon looks through binocular-like lenses on the equipment and a computer generates a three-dimensional view of the operating area. Foot pedals control the camera and allow the surgeon to zoom in or out to change the surgical view.  In what ways does robotic assisted surgery help the surgeon? The robotic assisted surgery is a computer-enhanced surgical system that gives surgeons the advantages of:  A 3-D view of the surgical field,  including depth, up to 15 times the magnification and high resolution Instruments that mimic the movement of the human hands, wrists and fingers, allowing an extensive range of motion that is more precise than the surgeon's natural hand and wrist movements A constant steadiness of the robot arms and instruments and robot wrists that make it easier for surgeons to operate on organs and tissues for long periods of time and from angles and positions they would have difficulty reaching with human hands and fingers The surgeon controls every precise movement of the robotic arms and instruments. The robotic arms cannot move on their own.  Who may benefit from robotic-assisted hysterectomy? Robotic assisted hysterectomy may be especially helpful in:  Patients who are obese Patients who have endometrial cancer Patients who have complex surgical cases, such as advanced stage endometriosis or pelvic adhesive disease (scar tissue that binds nearby organs together) You and your surgeon will discuss if robotic assisted surgery is possible and appropriate for your specific condition.  PROCEDURE DETAILS To perform robotic-assisted surgery, a surgeon is seated in front of a computerized console that provides a high-powered view of the operating area. Movement of robotic arms attached to surgical instruments is controlled by the surgeon seated at the console a few feet away. Typical operating room setup for robotic assisted surgery. The surgeon is seated seated in front of a computerized console that provides a high-powered, 3-D view of the operating area. The surgical instruments, attached to robotic arms, are controlled by the surgeon seated at the console. What happens before and during robotic assisted hysterectomy? Before the procedure  Before your surgery, your doctor will perform a physical exam, order blood   and urine tests and may order other tests to check your general health. Your doctor will tell you  which of your current medications can continue to be taken and which will need to be temporarily stopped before surgery. You will be given instructions on when to stop eating and drinking the evening before and morning of your surgery. Your surgeon will explain the procedure in detail, including possible complications and side effects. He or she will also answer your questions.  On the day of surgery:  A urinary catheter may be inserted to empty your bladder Your abdominal area will be cleaned with a sterile solution An intravenous (IV) line will be placed in a vein in your arm to deliver medications and fluids During the procedure  After receiving anesthesia, your surgeon will make four or five small surgical cuts (incisions) in your abdomen (belly). The thin surgical instruments and tiny lighted camera attached to the arms of the surgical robot are inserted into the abdomen through these incisions.  The surgeon controls the precise movement of the robotic arms, surgical instruments and camera while seated at a computer console. Members of the surgical team stand next to the operating table to change the robotic instruments and provide other assistance to the surgeon as needed.  Your surgeon typically removes the uterus through the vagina, like when delivering a baby. In certain cases, the uterus is removed through the small incisions in your abdomen.  An anesthesiologist monitors your anesthesia and vital signs throughout your operation.  How long does robotic assisted hysterectomy take to complete? Robotic assisted hysterectomy typically takes between one to four hours to complete, depending upon the surgeon and the complexity of the case.  What's the typical recovery time with robotic assisted hysterectomy? Robotic hysterectomy is an outpatient procedure. You may stay in the hospital overnight but some woman can be released the same day of surgery. You will able return to light regular  activities the next day (walking, eating, walking up stairs). You can drive in about a week or less at the discretion of your physician and return to exercising in about four to six weeks. Your doctor will review your progress and tell you when you can return to your normal activities.  RISKS / BENEFITS What are the advantages of robot-assisted surgery for patients compared with traditional open surgery? Compared with traditional open surgery, the benefits of robotic assisted surgery may include:  Less blood loss during surgery Smaller incisions with less scarring. Surgery is performed through small incisions instead of the large incision of open surgery Less post-op pain Decreased risk of infection Shorter hospital stay Shorter recovery time and quicker return to previous activities. You can usually resume normal activities as soon as you feel up to it. What are the risks of robotic hysterectomy? Robot-assisted hysterectomy takes more time compared to other hysterectomy methods, such as traditional open hysterectomy performed by a surgeon. Longer surgeries may increase your risk for complications.  Like any surgical procedure, robotic hysterectomy carries risks including:  Bleeding Damage to the bladder and other nearby organs Infection Reaction to anesthesia Blood clots that form in the legs and can travel to your lungs  RECOVERY AND OUTLOOK What is the prognosis (outlook) for people who have robotic hysterectomy? Most women recover from robotic hysterectomy in less time and with less pain compared to traditional, open hysterectomies. Because the incisions are small, people can return to their daily activities more quickly. With the exception of hysterectomy for cervical cancer, outcomes   with robotic surgery are as good as open surgery with shorter recovery. Robotic surgery is NOT recommended for hysterectomies done for cervical cancer as cancer-related outcomes are significantly  worse.  

## 2021-08-22 NOTE — H&P (Signed)
? ? ? ?GYNECOLOGY PREOPERATIVE HISTORY AND PHYSICAL  ? ?Subjective:  ?Leah Osborne is a 36 y.o. [id number] here for surgical management of suspected endometriosis, pelvic pain, menorrhagia, and mixed urinary incontinence.   Previously was on [place] for management of endometriosis, however due to insurance and cost, has been out of her medication for several months.  Cycles are now lasting 14 days, with associated cramping, clots, and headaches. Previously was also noting constipation and fatigue.  Has now initated Lupron for management but ultimately now desires definitive management with hysterectomy. Preoperative concerns include: morbid obesity ? ?Proposed surgery: Robotic-assisted total laparoscopic hysterectomy with bilateral salpingectomy. Also to have Cystoscopy with Urethral Bulking to be performed by UroGynecology (Dr. Wannetta Sender) ? ? ? ?Pertinent Gynecological History: ?Menses: flow is moderate, lasting up to 14 days.  ?Bleeding: dysfunctional uterine bleeding ?Contraception: tubal ligation ?Last pap: normal Date: 04/15/2019.  ? ? ?Past Medical History:  ?Diagnosis Date  ? Abnormal Pap smear of cervix   ? Arthritis   ? Arthritis, rheumatoid ([place])   ? Atypical chest pain   ? Bipolar 1 disorder ([place])   ? Headache   ? MIGRAINES  ? Hyperlipidemia   ? Hypertension   ? Morbid obesity ([place])   ? a. 11/2018 s/p Robotic Sleeve Gastrectomy @ [facility].  ? Palpitations   ? a. 09/2017 Zio Monitor: predominantly RSR, avg HR 87, 11 A tach/SVT episodes, fastest 176 x 7 beats, longest 9 beats. Rare PAC's, occas PVC's, rare couplets/triplets.  ? Persistent Nausea and vomiting   ? a. Following sleeve gastrectomy 11/2018  ? PTSD (post-traumatic stress disorder)   ? PVC (premature ventricular contraction)   ? Social phobia   ? Stroke [facility])   ? ? ?Past Surgical History:  ?Procedure Laterality Date  ? CHOLECYSTECTOMY    ? LAPAROSCOPIC GASTRIC SLEEVE RESECTION  11/19/2018  ? LAPAROSCOPIC TUBAL LIGATION Bilateral 09/19/2015  ?  Procedure: LAPAROSCOPIC BILATERAL TUBAL BANDING;  Surgeon: Brayton Mars, MD;  Location: [facility] ORS;  Service: Gynecology;  Laterality: Bilateral;  ? ? ?OB History  ?Gravida Para Term Preterm AB Living  ?'2 2 2 '$ 0 0 2  ?SAB IAB Ectopic Multiple Live Births  ?0 0 0 0 2  ?  ?# Outcome Date GA Lbr Len/2nd Weight Sex Delivery Anes PTL Lv  ?2 Term 2016   8 lb 1.6 oz (3.674 kg) F Vag-Spont   LIV  ?1 Term 2013   5 lb 2.2 oz (2.331 kg) M Vag-Spont   LIV  ? ? ?Family History  ?Problem Relation Age of Onset  ? Diabetes Maternal Grandmother   ? Heart disease Maternal Grandmother   ? Heart failure Maternal Grandmother   ? Heart attack Maternal Grandmother   ? Rheum arthritis Mother   ? Healthy Father   ? Heart Problems Maternal Grandfather   ? Stroke Maternal Grandfather   ? Atrial fibrillation Paternal Grandmother   ? Stroke Other   ?     mat great gf had stroke at 29  ? Cancer Neg Hx   ? ? ?Social History  ? ?Socioeconomic History  ? Marital status: Married  ?  Spouse name: Not on file  ? Number of children: Not on file  ? Years of education: Not on file  ? Highest education level: Not on file  ?Occupational History  ? Not on file  ?Tobacco Use  ? Smoking status: Never  ? Smokeless tobacco: Never  ?Vaping Use  ? Vaping Use: Never used  ?Substance and  Sexual Activity  ? Alcohol use: Not Currently  ?  Alcohol/week: 0.0 standard drinks  ? Drug use: No  ? Sexual activity: Yes  ?  Birth control/protection: Surgical  ?  Comment: tubal  ?Other Topics Concern  ? Not on file  ?Social History Narrative  ? Lives in [place] with husband  ? Right handed  ? ?Social Determinants of Health  ? ?Financial Resource Strain: Not on file  ?Food Insecurity: Not on file  ?Transportation Needs: Not on file  ?Physical Activity: Not on file  ?Stress: Not on file  ?Social Connections: Not on file  ?Intimate Partner Violence: Not on file  ? ? ?Current Outpatient Medications on File Prior to Visit  ?Medication Sig Dispense Refill  ? aspirin 81 MG EC  tablet Take 1 tablet (81 mg total) by mouth daily. Swallow whole. 90 tablet 3  ? atorvastatin (LIPITOR) 40 MG tablet Take 1 tablet (40 mg total) by mouth daily. 90 tablet 3  ? busPIRone (BUSPAR) 10 MG tablet Take 1 tablet (10 mg total) by mouth 3 (three) times daily. 270 tablet 0  ? fluticasone (FLONASE) 50 MCG/ACT nasal spray Place 2 sprays into both nostrils daily. Use for 4-6 weeks then stop and use seasonally or as needed. 16 g 3  ? lamoTRIgine (LAMICTAL) 150 MG tablet Take 1 tablet (150 mg total) by mouth daily. 90 tablet 0  ? leuprolide (LUPRON DEPOT, 62-MONTH,) 11.25 MG injection Inject 11.25 mg into the muscle every 3 (three) months. 1 each 3  ? levothyroxine (SYNTHROID) 25 MCG tablet Take 1 tablet (25 mcg total) by mouth daily before breakfast. 90 tablet 3  ? medroxyPROGESTERone (PROVERA) 10 MG tablet Take 1 tablet (10 mg total) by mouth daily. Use for ten days 10 tablet 2  ? MYRBETRIQ 50 MG TB24 tablet TAKE 1 TABLET BY MOUTH DAILY 60 tablet 5  ? spironolactone (ALDACTONE) 25 MG tablet Take 1 tablet (25 mg total) by mouth daily. 90 tablet 3  ? traZODone (DESYREL) 50 MG tablet Take 1 tablet (50 mg total) by mouth at bedtime. 90 tablet 0  ? WEGOVY 0.25 MG/0.5ML SOAJ Inject 0.25 mg into the skin once a week. 2 mL 0  ? ?No current facility-administered medications on file prior to visit.  ? ? ?No Known Allergies ? ? ?Review of Systems ?Constitutional: No recent fever/chills/sweats ?Respiratory: No recent cough/bronchitis ?Cardiovascular: No chest pain ?Gastrointestinal: No recent nausea/vomiting/diarrhea ?Genitourinary: No UTI symptoms ?Hematologic/lymphatic:No history of coagulopathy or recent blood thinner use  ?  ?Objective:  ? ?Blood pressure (!) 142/84, pulse 79, resp. rate 16, height '5\' 1"'$  (1.549 m), weight 283 lb (128.4 kg), last menstrual period 06/27/2021. ?CONSTITUTIONAL: Well-developed, well-nourished female in no acute distress.  ?HENT:  Normocephalic, atraumatic, External right and left ear normal.  Oropharynx is clear and moist ?EYES: Conjunctivae and EOM are normal. Pupils are equal, round, and reactive to light. No scleral icterus.  ?NECK: Normal range of motion, supple, no masses ?SKIN: Skin is warm and dry. No rash noted. Not diaphoretic. No erythema. No pallor. ?NEUROLOGIC: Alert and oriented to person, place, and time. Normal reflexes, muscle tone coordination. No cranial nerve deficit noted. ?PSYCHIATRIC: Normal mood and affect. Normal behavior. Normal judgment and thought content. ?CARDIOVASCULAR: Normal heart rate noted, regular rhythm ?RESPIRATORY: Effort and breath sounds normal, no problems with respiration noted ?ABDOMEN: Soft, nontender, nondistended. ?PELVIC: Deferred ?MUSCULOSKELETAL: Normal range of motion. No edema and no tenderness. 2+ distal pulses. ? ? ? ?Labs: ?Results for orders placed or  performed in visit on 08/18/21 (from the past 336 hour(s))  ?POCT HgB A1C  ? Collection Time: 08/18/21  5:10 PM  ?Result Value Ref Range  ? Hemoglobin A1C 5.8 (A) 4.0 - 5.6 %  ? ?Other labs pending for pre-op.  ? ? ?Imaging Studies: ?Pending ? ?Assessment:  ?  ?1. Other endometriosis   ?2. Mixed stress and urge urinary incontinence   ?3. Morbid obesity with BMI of 50.0-59.9, adult ([place])   ?4. Pelvic pain   ?5. Preoperative exam for gynecologic surgery   ? ? ?Plan:  ? ?Counseling: Procedure, risks, reasons, benefits and complications (including injury to bowel, bladder, major blood vessel, ureter, bleeding, possibility of transfusion, infection, or fistula formation) reviewed in detail. Likelihood of success in alleviating the patient's condition was discussed. Routine postoperative instructions will be reviewed with the patient and her family in detail after surgery.  The patient concurred with the proposed plan, giving informed written consent for the surgery.   ?Preop testing ordered. ?Instructions reviewed, including NPO after midnight. ?  ? ?Rubie Maid, MD ?[facility] ? ?

## 2021-08-25 ENCOUNTER — Telehealth: Payer: Self-pay | Admitting: Obstetrics and Gynecology

## 2021-08-25 ENCOUNTER — Other Ambulatory Visit: Payer: Self-pay

## 2021-08-25 ENCOUNTER — Encounter
Admission: RE | Admit: 2021-08-25 | Discharge: 2021-08-25 | Disposition: A | Discharge status: Home / Self Care | Payer: Managed Care, Other (non HMO) | Source: Ambulatory Visit | Attending: Obstetrics and Gynecology | Admitting: Obstetrics and Gynecology

## 2021-08-25 ENCOUNTER — Ambulatory Visit (INDEPENDENT_AMBULATORY_CARE_PROVIDER_SITE_OTHER): Payer: Managed Care, Other (non HMO)

## 2021-08-25 DIAGNOSIS — Z01818 Encounter for other preprocedural examination: Secondary | ICD-10-CM

## 2021-08-25 DIAGNOSIS — R102 Pelvic and perineal pain unspecified side: Secondary | ICD-10-CM

## 2021-08-25 DIAGNOSIS — N808 Other endometriosis: Secondary | ICD-10-CM

## 2021-08-25 NOTE — Telephone Encounter (Signed)
Contacted patient regarding upcoming surgery on 3/13. Initially scheduled for robotic assisted total laparoscopic hysterectomy with BSO and cystoscopy with urethral bulking.  UroGynecologist has noted to be unavailable for this date. Gave patient option of rescheduling surgery until a later date, or can proceed with hysterectomy as scheduled and have urethral bulking procedure performed at a later date when UroGynecologist is available.  Patient opts to proceed with hysterectomy as scheduled.  ? ? ?Dr. Marcelline Mates ?

## 2021-08-25 NOTE — Addendum Note (Signed)
Addended by: Augusto Gamble on: 08/25/2021 02:32 PM ? ? Modules accepted: Orders ? ?

## 2021-08-25 NOTE — Patient Instructions (Addendum)
Your procedure is scheduled on: 08/28/21 Report to [place]. (Arrive at 10:30 as instructed) To find out your arrival time please call [phone] between 1PM - 3PM on 08/25/21.  Remember: Instructions that are not followed completely may result in serious medical risk, up to and including death, or upon the discretion of your surgeon and anesthesiologist your surgery may need to be rescheduled.     _X__ 1. Do not eat food after midnight the night before your procedure.                 No gum chewing or hard candies. You may drink clear liquids up to 2 hours                 before you are scheduled to arrive for your surgery- DO not drink clear                 liquids within 2 hours of the start of your surgery.                 Clear Liquids include:  water, apple juice without pulp, clear carbohydrate                 drink such as Clearfast or Gatorade, Black Coffee or Tea (Do not add                 anything to coffee or tea). Diabetics water only  __X__2.  On the morning of surgery brush your teeth with toothpaste and water, you                 may rinse your mouth with mouthwash if you wish.  Do not swallow any              toothpaste of mouthwash.     _X__ 3.  No Alcohol for 24 hours before or after surgery.   _X__ 4.  Do Not Smoke or use e-cigarettes For 24 Hours Prior to Your Surgery.                 Do not use any chewable tobacco products for at least 6 hours prior to                 surgery.  ____  5.  Bring all medications with you on the day of surgery if instructed.   __X__  6.  Notify your doctor if there is any change in your medical condition      (cold, fever, infections).     Do not wear jewelry, make-up, hairpins, clips or nail polish. Do not wear lotions, powders, or perfumes.  Do not shave body hair 48 hours prior to surgery. Men may shave face and neck. Do not bring valuables to the hospital.    [facility] is not responsible for any belongings or valuables.  Contacts, dentures/partials or body piercings may not be worn into surgery. Bring a case for your contacts, glasses or hearing aids, a denture cup will be supplied. Leave your suitcase in the car. After surgery it may be brought to your room. For patients admitted to the hospital, discharge time is determined by your treatment team.   Patients discharged the day of surgery will not be allowed to drive home.   Please read over the following fact sheets that you were given:     __X__ Take these medicines the morning of surgery with  A SIP OF WATER:    1. levothyroxine (SYNTHROID) 25 MCG tablet  2. busPIRone (BUSPAR) 10 MG tablet  3.   4.  5.  6.  ____ Fleet Enema (as directed)   ____ Use CHG Soap/SAGE wipes as directed  ____ Use inhalers on the day of surgery  ____ Stop metformin/Janumet/Farxiga 2 days prior to surgery    ____ Take 1/2 of usual insulin dose the night before surgery. No insulin the morning          of surgery.   ____ Stop Blood Thinners Coumadin/Plavix/Xarelto/Pleta/Pradaxa/Eliquis/Effient/Aspirin  on   Or contact your Surgeon, Cardiologist or Medical Doctor regarding  ability to stop your blood thinners  __X__ Stop Anti-inflammatories 7 days before surgery such as Advil, Ibuprofen, Motrin,  BC or Goodies Powder, Naprosyn, Naproxen, Aleve, Aspirin    __X__ Stop all herbal supplements, fish oil or vitamin E until after surgery.    ____ Bring C-Pap to the hospital.    Shower with Caroll Rancher Dial soap the night before and the morning of surgery    How to Use an Incentive Spirometer An incentive spirometer is a tool that measures how well you are filling your lungs with each breath. Learning to take long, deep breaths using this tool can help you keep your lungs clear and active. This may help to reverse or lessen your chance of developing breathing (pulmonary) problems, especially infection. You may be  asked to use a spirometer: After a surgery. If you have a lung problem or a history of smoking. After a long period of time when you have been unable to move or be active. If the spirometer includes an indicator to show the highest number that you have reached, your health care provider or respiratory therapist will help you set a goal. Keep a log of your progress as told by your health care provider. What are the risks? Breathing too quickly may cause dizziness or cause you to pass out. Take your time so you do not get dizzy or light-headed. If you are in pain, you may need to take pain medicine before doing incentive spirometry. It is harder to take a deep breath if you are having pain. How to use your incentive spirometer  Sit up on the edge of your bed or on a chair. Hold the incentive spirometer so that it is in an upright position. Before you use the spirometer, breathe out normally. Place the mouthpiece in your mouth. Make sure your lips are closed tightly around it. Breathe in slowly and as deeply as you can through your mouth, causing the piston or the ball to rise toward the top of the chamber. Hold your breath for 3-5 seconds, or for as long as possible. If the spirometer includes a coach indicator, use this to guide you in breathing. Slow down your breathing if the indicator goes above the marked areas. Remove the mouthpiece from your mouth and breathe out normally. The piston or ball will return to the bottom of the chamber. Rest for a few seconds, then repeat the steps 10 or more times. Take your time and take a few normal breaths between deep breaths so that you do not get dizzy or light-headed. Do this every 1-2 hours when you are awake. If the spirometer includes a goal marker to show the highest number you have reached (best effort), use this as a goal to work toward during each repetition. After each set of 10 deep breaths, cough a few  times. This will help to make sure that  your lungs are clear. If you have an incision on your chest or abdomen from surgery, place a pillow or a rolled-up towel firmly against the incision when you cough. This can help to reduce pain while taking deep breaths and coughing. General tips When you are able to get out of bed: Walk around often. Continue to take deep breaths and cough in order to clear your lungs. Keep using the incentive spirometer until your health care provider says it is okay to stop using it. If you have been in the hospital, you may be told to keep using the spirometer at home. Contact a health care provider if: You are having difficulty using the spirometer. You have trouble using the spirometer as often as instructed. Your pain medicine is not giving enough relief for you to use the spirometer as told. You have a fever. Get help right away if: You develop shortness of breath. You develop a cough with bloody mucus from the lungs. You have fluid or blood coming from an incision site after you cough. Summary An incentive spirometer is a tool that can help you learn to take long, deep breaths to keep your lungs clear and active. You may be asked to use a spirometer after a surgery, if you have a lung problem or a history of smoking, or if you have been inactive for a long period of time. Use your incentive spirometer as instructed every 1-2 hours while you are awake. If you have an incision on your chest or abdomen, place a pillow or a rolled-up towel firmly against your incision when you cough. This will help to reduce pain. Get help right away if you have shortness of breath, you cough up bloody mucus, or blood comes from your incision when you cough. This information is not intended to replace advice given to you by your health care provider. Make sure you discuss any questions you have with your health care provider. Document Revised: 08/24/2019 Document Reviewed: 08/24/2019 Elsevier Patient Education  [place].

## 2021-08-28 ENCOUNTER — Ambulatory Visit: Payer: Managed Care, Other (non HMO) | Admitting: Certified Registered"

## 2021-08-28 ENCOUNTER — Ambulatory Visit
Admission: RE | Admit: 2021-08-28 | Discharge: 2021-08-28 | Disposition: A | Discharge status: Home / Self Care | Payer: Managed Care, Other (non HMO) | Source: Ambulatory Visit | Attending: Obstetrics and Gynecology | Admitting: Obstetrics and Gynecology

## 2021-08-28 ENCOUNTER — Other Ambulatory Visit: Payer: Self-pay

## 2021-08-28 ENCOUNTER — Encounter
Admission: RE | Disposition: A | Discharge status: Home / Self Care | Payer: Self-pay | Source: Ambulatory Visit | Attending: Obstetrics and Gynecology

## 2021-08-28 ENCOUNTER — Encounter: Payer: Self-pay | Admitting: Obstetrics and Gynecology

## 2021-08-28 DIAGNOSIS — Z79818 Long term (current) use of other agents affecting estrogen receptors and estrogen levels: Secondary | ICD-10-CM | POA: Diagnosis not present

## 2021-08-28 DIAGNOSIS — N920 Excessive and frequent menstruation with regular cycle: Secondary | ICD-10-CM | POA: Diagnosis not present

## 2021-08-28 DIAGNOSIS — R102 Pelvic and perineal pain: Secondary | ICD-10-CM | POA: Insufficient documentation

## 2021-08-28 DIAGNOSIS — N72 Inflammatory disease of cervix uteri: Secondary | ICD-10-CM | POA: Diagnosis not present

## 2021-08-28 DIAGNOSIS — D251 Intramural leiomyoma of uterus: Secondary | ICD-10-CM | POA: Insufficient documentation

## 2021-08-28 DIAGNOSIS — D252 Subserosal leiomyoma of uterus: Secondary | ICD-10-CM | POA: Diagnosis not present

## 2021-08-28 DIAGNOSIS — N809 Endometriosis, unspecified: Secondary | ICD-10-CM | POA: Insufficient documentation

## 2021-08-28 DIAGNOSIS — Z6841 Body Mass Index (BMI) 40.0 and over, adult: Secondary | ICD-10-CM | POA: Insufficient documentation

## 2021-08-28 DIAGNOSIS — N3946 Mixed incontinence: Secondary | ICD-10-CM | POA: Diagnosis not present

## 2021-08-28 DIAGNOSIS — N888 Other specified noninflammatory disorders of cervix uteri: Secondary | ICD-10-CM | POA: Insufficient documentation

## 2021-08-28 DIAGNOSIS — Z01818 Encounter for other preprocedural examination: Secondary | ICD-10-CM

## 2021-08-28 LAB — CBC
HCT: 40.8 % (ref 36.0–46.0)
Hemoglobin: 12.7 g/dL (ref 12.0–15.0)
MCH: 25 pg — ABNORMAL LOW (ref 26.0–34.0)
MCHC: 31.1 g/dL (ref 30.0–36.0)
MCV: 80.5 fL (ref 80.0–100.0)
Platelets: 467 10*3/uL — ABNORMAL HIGH (ref 150–400)
RBC: 5.07 MIL/uL (ref 3.87–5.11)
RDW: 14.8 % (ref 11.5–15.5)
WBC: 9.5 10*3/uL (ref 4.0–10.5)
nRBC: 0 % (ref 0.0–0.2)

## 2021-08-28 LAB — TYPE AND SCREEN
ABO/RH(D): O POS
Antibody Screen: NEGATIVE

## 2021-08-28 LAB — ABO/RH: ABO/RH(D): O POS

## 2021-08-28 LAB — POCT PREGNANCY, URINE: Preg Test, Ur: NEGATIVE

## 2021-08-28 SURGERY — HYSTERECTOMY, TOTAL, ROBOT-ASSISTED
Anesthesia: General | Site: Pelvis | Laterality: Bilateral

## 2021-08-28 MED ORDER — SODIUM CHLORIDE 0.9 % IR SOLN
Status: DC | PRN
Start: 2021-08-28 — End: 2021-08-28
  Administered 2021-08-28: 200 mL

## 2021-08-28 MED ORDER — LIDOCAINE HCL (CARDIAC) PF 100 MG/5ML IV SOSY
PREFILLED_SYRINGE | INTRAVENOUS | Status: DC | PRN
  Administered 2021-08-28: 100 mg via INTRAVENOUS

## 2021-08-28 MED ORDER — ROCURONIUM BROMIDE 100 MG/10ML IV SOLN
INTRAVENOUS | Status: DC | PRN
  Administered 2021-08-28: 10 mg via INTRAVENOUS
  Administered 2021-08-28: 60 mg via INTRAVENOUS
  Administered 2021-08-28: 20 mg via INTRAVENOUS

## 2021-08-28 MED ORDER — ONDANSETRON HCL 4 MG/2ML IJ SOLN: 4.0000 mg | Freq: Once | INTRAMUSCULAR | Status: AC | PRN

## 2021-08-28 MED ORDER — ONDANSETRON HCL 4 MG/2ML IJ SOLN
INTRAMUSCULAR | Status: DC | PRN
  Administered 2021-08-28: 4 mg via INTRAVENOUS

## 2021-08-28 MED ORDER — SUCCINYLCHOLINE CHLORIDE 200 MG/10ML IV SOSY
PREFILLED_SYRINGE | INTRAVENOUS | Status: DC | PRN
  Administered 2021-08-28: 140 mg via INTRAVENOUS

## 2021-08-28 MED ORDER — METHYLENE BLUE 0.5 % INJ SOLN
INTRAVENOUS | Status: AC
  Filled 2021-08-28: qty 10

## 2021-08-28 MED ORDER — CHLORHEXIDINE GLUCONATE 0.12 % MT SOLN
OROMUCOSAL | Status: AC
  Administered 2021-08-28: 15 mL via OROMUCOSAL
  Filled 2021-08-28: qty 15

## 2021-08-28 MED ORDER — HEPARIN SODIUM (PORCINE) 5000 UNIT/ML IJ SOLN
5000.0000 [IU] | INTRAMUSCULAR | Status: AC
Start: 2021-08-28 — End: 2021-08-28

## 2021-08-28 MED ORDER — MIDAZOLAM HCL 2 MG/2ML IJ SOLN
INTRAMUSCULAR | Status: AC
  Filled 2021-08-28: qty 2

## 2021-08-28 MED ORDER — DEXAMETHASONE SODIUM PHOSPHATE 10 MG/ML IJ SOLN
INTRAMUSCULAR | Status: AC
  Filled 2021-08-28: qty 1

## 2021-08-28 MED ORDER — PROPOFOL 10 MG/ML IV BOLUS
INTRAVENOUS | Status: AC
  Filled 2021-08-28: qty 20

## 2021-08-28 MED ORDER — FENTANYL CITRATE (PF) 100 MCG/2ML IJ SOLN
INTRAMUSCULAR | Status: AC
  Filled 2021-08-28: qty 2

## 2021-08-28 MED ORDER — OXYCODONE HCL 5 MG PO TABS
5.0000 mg | ORAL_TABLET | Freq: Once | ORAL | Status: AC
  Administered 2021-08-28: 5 mg via ORAL

## 2021-08-28 MED ORDER — FENTANYL CITRATE (PF) 100 MCG/2ML IJ SOLN
INTRAMUSCULAR | Status: AC
  Administered 2021-08-28: 25 ug via INTRAVENOUS
  Filled 2021-08-28: qty 2

## 2021-08-28 MED ORDER — OXYCODONE-ACETAMINOPHEN 5-325 MG PO TABS
1.0000 | ORAL_TABLET | Freq: Four times a day (QID) | ORAL | 0 refills | Status: DC | PRN
Start: 2021-08-28 — End: 2021-12-25

## 2021-08-28 MED ORDER — SUGAMMADEX SODIUM 500 MG/5ML IV SOLN
INTRAVENOUS | Status: DC | PRN
  Administered 2021-08-28: 300 mg via INTRAVENOUS

## 2021-08-28 MED ORDER — ONDANSETRON HCL 4 MG/2ML IJ SOLN
INTRAMUSCULAR | Status: AC
  Administered 2021-08-28: 4 mg via INTRAVENOUS
  Filled 2021-08-28: qty 2

## 2021-08-28 MED ORDER — KETAMINE HCL 10 MG/ML IJ SOLN
INTRAMUSCULAR | Status: DC | PRN
  Administered 2021-08-28 (×2): 10 mg via INTRAVENOUS
  Administered 2021-08-28: 20 mg via INTRAVENOUS
  Administered 2021-08-28: 10 mg via INTRAVENOUS

## 2021-08-28 MED ORDER — CEFAZOLIN IN SODIUM CHLORIDE 3-0.9 GM/100ML-% IV SOLN
3.0000 g | INTRAVENOUS | Status: AC
  Administered 2021-08-28: 3 g via INTRAVENOUS
  Filled 2021-08-28: qty 100

## 2021-08-28 MED ORDER — ONDANSETRON HCL 4 MG/2ML IJ SOLN
INTRAMUSCULAR | Status: AC
  Filled 2021-08-28: qty 2

## 2021-08-28 MED ORDER — HEPARIN SODIUM (PORCINE) 5000 UNIT/ML IJ SOLN
INTRAMUSCULAR | Status: AC
  Administered 2021-08-28: 5000 [IU] via SUBCUTANEOUS
  Filled 2021-08-28: qty 1

## 2021-08-28 MED ORDER — EPHEDRINE SULFATE (PRESSORS) 50 MG/ML IJ SOLN
INTRAMUSCULAR | Status: DC | PRN
  Administered 2021-08-28 (×2): 5 mg via INTRAVENOUS

## 2021-08-28 MED ORDER — EPHEDRINE 5 MG/ML INJ
INTRAVENOUS | Status: AC
  Filled 2021-08-28: qty 5

## 2021-08-28 MED ORDER — CHLORHEXIDINE GLUCONATE 0.12 % MT SOLN: 15.0000 mL | Freq: Once | OROMUCOSAL | Status: AC

## 2021-08-28 MED ORDER — ROCURONIUM BROMIDE 10 MG/ML (PF) SYRINGE
PREFILLED_SYRINGE | INTRAVENOUS | Status: AC
  Filled 2021-08-28: qty 10

## 2021-08-28 MED ORDER — LIDOCAINE HCL (PF) 2 % IJ SOLN
INTRAMUSCULAR | Status: AC
  Filled 2021-08-28: qty 5

## 2021-08-28 MED ORDER — LACTATED RINGERS IV SOLN
INTRAVENOUS | Status: DC
Start: 2021-08-28 — End: 2021-08-28

## 2021-08-28 MED ORDER — DEXAMETHASONE SODIUM PHOSPHATE 10 MG/ML IJ SOLN
INTRAMUSCULAR | Status: DC | PRN
  Administered 2021-08-28: 10 mg via INTRAVENOUS

## 2021-08-28 MED ORDER — ACETAMINOPHEN 500 MG PO TABS: 1000.0000 mg | ORAL_TABLET | ORAL | Status: AC

## 2021-08-28 MED ORDER — SUCCINYLCHOLINE CHLORIDE 200 MG/10ML IV SOSY
PREFILLED_SYRINGE | INTRAVENOUS | Status: AC
  Filled 2021-08-28: qty 10

## 2021-08-28 MED ORDER — KETAMINE HCL 50 MG/5ML IJ SOSY
PREFILLED_SYRINGE | INTRAMUSCULAR | Status: AC
  Filled 2021-08-28: qty 5

## 2021-08-28 MED ORDER — BUPIVACAINE HCL 0.5 % IJ SOLN
INTRAMUSCULAR | Status: DC | PRN
  Administered 2021-08-28: 20 mL

## 2021-08-28 MED ORDER — MIDAZOLAM HCL 2 MG/2ML IJ SOLN
INTRAMUSCULAR | Status: DC | PRN
  Administered 2021-08-28: 2 mg via INTRAVENOUS

## 2021-08-28 MED ORDER — PHENYLEPHRINE HCL (PRESSORS) 10 MG/ML IV SOLN
INTRAVENOUS | Status: DC | PRN
  Administered 2021-08-28: 80 ug via INTRAVENOUS

## 2021-08-28 MED ORDER — GABAPENTIN 300 MG PO CAPS
ORAL_CAPSULE | ORAL | Status: AC
Start: 2021-08-28 — End: 2021-08-28
  Administered 2021-08-28: 300 mg via ORAL
  Filled 2021-08-28: qty 1

## 2021-08-28 MED ORDER — FENTANYL CITRATE (PF) 100 MCG/2ML IJ SOLN
INTRAMUSCULAR | Status: DC | PRN
  Administered 2021-08-28: 50 ug via INTRAVENOUS
  Administered 2021-08-28 (×2): 25 ug via INTRAVENOUS
  Administered 2021-08-28: 50 ug via INTRAVENOUS

## 2021-08-28 MED ORDER — OXYCODONE HCL 5 MG PO TABS
ORAL_TABLET | ORAL | Status: AC
  Filled 2021-08-28: qty 1

## 2021-08-28 MED ORDER — GLYCOPYRROLATE PF 0.2 MG/ML IJ SOSY
PREFILLED_SYRINGE | INTRAMUSCULAR | Status: DC | PRN
  Administered 2021-08-28: .2 mg via INTRAVENOUS

## 2021-08-28 MED ORDER — FENTANYL CITRATE (PF) 100 MCG/2ML IJ SOLN
25.0000 ug | INTRAMUSCULAR | Status: DC | PRN
  Administered 2021-08-28 (×2): 25 ug via INTRAVENOUS

## 2021-08-28 MED ORDER — POVIDONE-IODINE 10 % EX SWAB: 2.0000 "application " | Freq: Once | CUTANEOUS | Status: DC

## 2021-08-28 MED ORDER — ACETAMINOPHEN 500 MG PO TABS
ORAL_TABLET | ORAL | Status: AC
  Administered 2021-08-28: 1000 mg via ORAL
  Filled 2021-08-28: qty 2

## 2021-08-28 MED ORDER — GABAPENTIN 300 MG PO CAPS: 300.0000 mg | ORAL_CAPSULE | ORAL | Status: AC

## 2021-08-28 MED ORDER — SEVOFLURANE IN SOLN
RESPIRATORY_TRACT | Status: AC
  Filled 2021-08-28: qty 250

## 2021-08-28 MED ORDER — PROPOFOL 10 MG/ML IV BOLUS
INTRAVENOUS | Status: DC | PRN
  Administered 2021-08-28: 200 mg via INTRAVENOUS

## 2021-08-28 MED ORDER — GLYCOPYRROLATE 0.2 MG/ML IJ SOLN
INTRAMUSCULAR | Status: AC
  Filled 2021-08-28: qty 1

## 2021-08-28 MED ORDER — ORAL CARE MOUTH RINSE
15.0000 mL | Freq: Once | OROMUCOSAL | Status: AC
Start: 2021-08-28 — End: 2021-08-28

## 2021-08-28 MED ORDER — SUGAMMADEX SODIUM 500 MG/5ML IV SOLN
INTRAVENOUS | Status: AC
  Filled 2021-08-28: qty 5

## 2021-08-28 MED ORDER — BUPIVACAINE HCL (PF) 0.5 % IJ SOLN
INTRAMUSCULAR | Status: AC
  Filled 2021-08-28: qty 30

## 2021-08-28 SURGICAL SUPPLY — 84 items
ADH SKN CLS APL DERMABOND .7 (GAUZE/BANDAGES/DRESSINGS) ×1
BACTOSHIELD CHG 4% 4OZ (MISCELLANEOUS) ×1
BAG DRN RND TRDRP ANRFLXCHMBR (UROLOGICAL SUPPLIES) ×1
BAG URINE DRAIN 2000ML AR STRL (UROLOGICAL SUPPLIES) ×2 IMPLANT
BLADE SURG SZ11 CARB STEEL (BLADE) ×2 IMPLANT
CANNULA CAP OBTURATR AIRSEAL 8 (CAP) ×2 IMPLANT
CANNULA DILATOR 5 W/SLV (CANNULA) IMPLANT
CATH FOLEY 2WAY  5CC 16FR (CATHETERS) ×1
CATH FOLEY 2WAY 5CC 16FR (CATHETERS) ×1
CATH URTH 16FR FL 2W BLN LF (CATHETERS) ×1 IMPLANT
COVER TIP SHEARS 8 DVNC (MISCELLANEOUS) ×1 IMPLANT
COVER TIP SHEARS 8MM DA VINCI (MISCELLANEOUS) ×1
COVER WAND RF STERILE (DRAPES) ×2 IMPLANT
DEFOGGER SCOPE WARMER CLEARIFY (MISCELLANEOUS) ×2 IMPLANT
DERMABOND ADVANCED (GAUZE/BANDAGES/DRESSINGS) ×1
DERMABOND ADVANCED .7 DNX12 (GAUZE/BANDAGES/DRESSINGS) ×1 IMPLANT
DEVICE TROCAR PUNCTURE CLOSURE (ENDOMECHANICALS) ×1 IMPLANT
DRAPE 3/4 80X56 (DRAPES) ×4 IMPLANT
DRAPE ARM DVNC X/XI (DISPOSABLE) ×3 IMPLANT
DRAPE COLUMN DVNC XI (DISPOSABLE) ×1 IMPLANT
DRAPE DA VINCI XI ARM (DISPOSABLE) ×3
DRAPE DA VINCI XI COLUMN (DISPOSABLE) ×1
DRAPE ROBOT W/ LEGGING 30X125 (DRAPES) ×2 IMPLANT
ELECT REM PT RETURN 9FT ADLT (ELECTROSURGICAL) ×2
ELECTRODE REM PT RTRN 9FT ADLT (ELECTROSURGICAL) ×1 IMPLANT
GAUZE 4X4 16PLY ~~LOC~~+RFID DBL (SPONGE) ×4 IMPLANT
GLOVE SURG ENC MOIS LTX SZ6.5 (GLOVE) ×8 IMPLANT
GLOVE SURG POLY ORTHO LF SZ7.5 (GLOVE) IMPLANT
GLOVE SURG UNDER LTX SZ7 (GLOVE) ×8 IMPLANT
GOWN STRL REUS W/ TWL LRG LVL3 (GOWN DISPOSABLE) ×4 IMPLANT
GOWN STRL REUS W/TWL LRG LVL3 (GOWN DISPOSABLE) ×8
GRASPER SUT TROCAR 14GX15 (MISCELLANEOUS) ×2 IMPLANT
GYRUS RUMI II 2.5CM BLUE (DISPOSABLE) ×2
GYRUS RUMI II 3.5CM BLUE (DISPOSABLE)
IRRIGATION STRYKERFLOW (MISCELLANEOUS) IMPLANT
IRRIGATOR STRYKERFLOW (MISCELLANEOUS) ×2
IV NS 1000ML (IV SOLUTION) ×2
IV NS 1000ML BAXH (IV SOLUTION) ×1 IMPLANT
KIT IMAGING PINPOINTPAQ (MISCELLANEOUS) IMPLANT
KIT PINK PAD W/HEAD ARE REST (MISCELLANEOUS) ×2
KIT PINK PAD W/HEAD ARM REST (MISCELLANEOUS) ×1 IMPLANT
LABEL OR SOLS (LABEL) ×2 IMPLANT
MANIFOLD NEPTUNE II (INSTRUMENTS) ×2 IMPLANT
NEEDLE VERESS 14GA 120MM (NEEDLE) ×2 IMPLANT
NS IRRIG 1000ML POUR BTL (IV SOLUTION) ×2 IMPLANT
OBTURATOR OPTICAL STANDARD 8MM (TROCAR) ×1
OBTURATOR OPTICAL STND 8 DVNC (TROCAR) ×1
OBTURATOR OPTICALSTD 8 DVNC (TROCAR) IMPLANT
OCCLUDER COLPOPNEUMO (BALLOONS) ×2 IMPLANT
PACK GYN LAPAROSCOPIC (MISCELLANEOUS) ×2 IMPLANT
PAD OB MATERNITY 4.3X12.25 (PERSONAL CARE ITEMS) ×2 IMPLANT
RUMI II 3.0CM BLUE KOH-EFFICIE (DISPOSABLE) IMPLANT
RUMI II GYRUS 2.5CM BLUE (DISPOSABLE) IMPLANT
RUMI II GYRUS 3.5CM BLUE (DISPOSABLE) IMPLANT
SCISSORS METZENBAUM CVD 33 (INSTRUMENTS) ×2 IMPLANT
SCRUB CHG 4% DYNA-HEX 4OZ (MISCELLANEOUS) ×1 IMPLANT
SEAL CANN UNIV 5-8 DVNC XI (MISCELLANEOUS) ×3 IMPLANT
SEAL XI 5MM-8MM UNIVERSAL (MISCELLANEOUS) ×3
SEALER VESSEL DA VINCI XI (MISCELLANEOUS) ×1
SEALER VESSEL EXT DVNC XI (MISCELLANEOUS) IMPLANT
SET CYSTO W/LG BORE CLAMP LF (SET/KITS/TRAYS/PACK) IMPLANT
SET TUBE FILTERED XL AIRSEAL (SET/KITS/TRAYS/PACK) ×2 IMPLANT
SOL PREP PVP 2OZ (MISCELLANEOUS) ×2
SOLUTION ELECTROLUBE (MISCELLANEOUS) ×2 IMPLANT
SOLUTION PREP PVP 2OZ (MISCELLANEOUS) ×1 IMPLANT
SURGILUBE 2OZ TUBE FLIPTOP (MISCELLANEOUS) ×2 IMPLANT
SUT DVC VLOC 180 0 12IN GS21 (SUTURE) ×2
SUT MNCRL 4-0 (SUTURE) ×2
SUT MNCRL 4-0 27XMFL (SUTURE) ×1
SUT VIC AB 2-0 CT1 27 (SUTURE) ×2
SUT VIC AB 2-0 CT1 TAPERPNT 27 (SUTURE) ×1 IMPLANT
SUT VICRYL 0 AB UR-6 (SUTURE) ×2 IMPLANT
SUTURE DVC VLC 180 0 12IN GS21 (SUTURE) ×1 IMPLANT
SUTURE MNCRL 4-0 27XMF (SUTURE) ×1 IMPLANT
SYR 10ML LL (SYRINGE) ×2 IMPLANT
SYR 50ML LL SCALE MARK (SYRINGE) ×2 IMPLANT
TIP RUMI ORANGE 6.7MMX12CM (TIP) IMPLANT
TIP UTERINE 5.1X6CM LAV DISP (MISCELLANEOUS) IMPLANT
TIP UTERINE 6.7X10CM GRN DISP (MISCELLANEOUS) IMPLANT
TIP UTERINE 6.7X6CM WHT DISP (MISCELLANEOUS) IMPLANT
TIP UTERINE 6.7X8CM BLUE DISP (MISCELLANEOUS) ×1 IMPLANT
TROCAR ENDO BLADELESS 11MM (ENDOMECHANICALS) ×3 IMPLANT
TUBING EVAC SMOKE HEATED PNEUM (TUBING) IMPLANT
WATER STERILE IRR 500ML POUR (IV SOLUTION) ×2 IMPLANT

## 2021-08-28 NOTE — Interval H&P Note (Signed)
History and Physical Interval Note: ? ?08/28/2021 ?10:59 AM ? ?Leah Osborne  has presented today for surgery, with the diagnosis of endometriosis, pelvic pain, stress incontinence.  The various methods of treatment have been discussed with the patient and family. After consideration of risks, benefits and other options for treatment, the patient has consented to  Procedure(s): XI ROBOTIC ASSISTED TOTAL HYSTERECTOMY (N/A) WITH BILATERAL SALPINGECTOMY as a surgical intervention.  The patient's history has been reviewed, patient examined, no change in status, stable for surgery.  I have reviewed the patient's chart and labs.  Questions were answered to the patient's satisfaction.   ? ? ?Recent Imaging noted:  ?US PELVIC COMPLETE WITH TRANSVAGINAL ?Patient Name: Leah Osborne ?DOB: 18-Oct-1985 ?MRN: [id number] ? ?ULTRASOUND REPORT ? ?Location: [facility]  ?Date of Service: 08/25/2021  ? ?Indications:Pelvic Pain; Pre-op  ? ?Study limited due to patient body habitus. ? ?Findings:  ?The uterus is anteverted and measures 7.2 x 4.3 x 3.2 cm. ?Echo texture is heterogenous without evidence of focal masses. ? ?The Endometrium measures 6 mm. ? ?Right Ovary is not visualized. ?Left Ovary measures 1.2 x 2.0 x 1.6 cm. It is normal in appearance. ?Survey of the adnexa demonstrates no adnexal masses. ?There is no free fluid in the cul de sac. ? ?Impression: ?1. Normal pelvic ultrasound. ? ?Recommendations: ?1.Clinical correlation with the patient's History and Physical Exam. ? ?Vivien Rota  [place]  ? ?I have reviewed this study and agree with documented findings.  ? ?Rubie Maid, MD ?[facility] ? ? ? ?Rubie Maid, MD ?[facility] ? ? ?

## 2021-08-28 NOTE — Op Note (Signed)
Procedure(s): XI ROBOTIC ASSISTED HYSTERECTOMY, BILATERAL SALPINGECTOMY Procedure Note  AFSA MEANY female 36 y.o. 08/28/2021  Indications: The patient is a morbidly obese 36 y.o. [id number] female with pelvic pain, history of endometriosis, menorrhagia, and mixed urinary incontinence  Pre-operative Diagnosis: Pelvic pain, history of endometriosis, menorrhagia, mixed urinary incontinence, morbid obesity (BMI = 53)  Post-operative Diagnosis: Same  Surgeon: Rubie Maid, MD  Assistants:  Jeannie Fend, MD. An experienced assistant was required given the standard of surgical care given the complexity of the case.  This assistant was needed for exposure, dissection, suctioning, retraction, instrument exchange, and for overall help during the procedure.  Anesthesia: General endotracheal anesthesia   Findings: The uterus was sounded to 8 cm.  Fallopian tubes previously surgically interrupted. Otherwise, fallopian tubes and ovaries appeared normal.  No obvious endometriosis implants located in the pelvis.  Upper abdomen appeared normal.   Procedure Details: The patient was seen in the Holding Room. The risks, benefits, complications, treatment options, and expected outcomes were discussed with the patient.  The patient concurred with the proposed plan, giving informed consent.  The site of surgery properly noted/marked. The patient was taken to the Operating Room, identified as [place] and the procedure verified as Procedure(s) (LRB): XI ROBOTIC ASSISTED HYSTERECTOMY, BILATERAL SALPINGECTOMY (Bilateral). A Time Out was held and the above information confirmed.  After induction of anesthesia, the patient was prepped and draped in the usual sterile manner. Pt was placed in dorsal lithotomy position after anesthesia and draped and prepped in the usual sterile manner. Foley catheter was placed.  A sterile speculum was placed into the vagina.  The cervix was grasped with a  single-tooth tenaculum and the uterus was sounded to 8 cm. RUMI uterine manipulator with occluder balloon was placed.   Attention was then turned to the abdomen, where  an 8 mm incision was made supraumubilcally.  The Veress needle was passed and a pneumoperitoneum was established.  The Veress needle was then removed and an 8 mm port was placed supraumbilically.  The daVinci camera was then placed supraumbilically. Three more ports were then placed. There were two 8 mm ports that were placed 10 cm laterally to the umbilicus and 2 cm inferiorly on either side.  The 11 mm assistant port was then placed in the right upper quadrant 2 cm lateral and superiorr to the supraumbilical port site. All incisions were injected with local anesthetic (Sensorcaine 0.5%, total of 18 cc) prior to port placement. The daVinci robot was then docked in the normal fashion. The patient was placed in steep Trendelenburg positioning.  Inspection of the pelvis showed a normal uterus, ovaries, and tubes. Fallopian tubes were previously surgically interrupted.  The right mesosalpinx of the right fallopian tube was cauterized and cut using the vessel sealer device.  The fallopian tube was then removed through the assistant port. The utero-ovarian ligament was also coagulated and cut. The round ligament was coagulated and cut. A bladder flap was created and the bladder was dissected down from the cervix.This entire procedure was then repeated on the left side.   The uterine arteries were then skeletonized, and cauterized using the vessel sealer device.  The blue balloon cuff was then identified and anincision was made in the cervicovaginal junction on top of the vaginal cuff. This was also repeated posteriorly. The incision was extended laterally, freeing the uterus from the surrounding vagina. The uterus was then delivered posteriorly through the vagina using the robotic assistant.    The vaginal  cuff was closed with a running suture of  0 Vicryl V-lock. The ureters were identified bilaterally. The entire pelvis was hemostatic. The right assistant site was closed with a suture of -0 Vicryl using the cone PMI device and the [employer]. The skin of all incisions were closed with 4-0 Monocryl using subcuticular stitches. Dermabond was placed over all incisions.  The final needle, sponge, and instrument count was correct. The patient tolerated the procedure well. Patient to the recovery room in good condition.    Estimated Blood Loss:  20 ml      Drains: foley catheterization to gravity drainage with 300 ml of clear urine         Total IV Fluids:  1100 ml  Specimens: Uterus with bilateral fallopian tubes         Implants: None         Complications:  None; patient tolerated the procedure well.         Disposition: PACU - hemodynamically stable.         Condition: stable   Rubie Maid, MD [facility]

## 2021-08-28 NOTE — Anesthesia Procedure Notes (Signed)
Procedure Name: Intubation ?Date/Time: 08/28/2021 12:22 PM ?Performed by: Cammie Sickle, CRNA ?Pre-anesthesia Checklist: Patient identified, Patient being monitored, Timeout performed, Emergency Drugs available and Suction available ?Patient Re-evaluated:Patient Re-evaluated prior to induction ?Oxygen Delivery Method: Circle system utilized ?Preoxygenation: Pre-oxygenation with 100% oxygen ?Induction Type: IV induction ?Ventilation: Mask ventilation without difficulty ?Laryngoscope Size: Mac and 3 ?Grade View: Grade I ?Tube type: Oral ?Tube size: 7.0 mm ?Number of attempts: 1 ?Airway Equipment and Method: Stylet ?Placement Confirmation: ETT inserted through vocal cords under direct vision, positive ETCO2 and breath sounds checked- equal and bilateral ?Secured at: 21 cm ?Tube secured with: Tape ?Dental Injury: Teeth and Oropharynx as per pre-operative assessment  ? ? ? ? ?

## 2021-08-28 NOTE — Discharge Instructions (Addendum)
AMBULATORY SURGERY  ?DISCHARGE INSTRUCTIONS ? ? ?The drugs that you were given will stay in your system until tomorrow so for the next 24 hours you should not: ? ?Drive an automobile ?Make any legal decisions ?Drink any alcoholic beverage ? ? ?You may resume regular meals tomorrow.  Today it is better to start with liquids and gradually work up to solid foods. ? ?You may eat anything you prefer, but it is better to start with liquids, then soup and crackers, and gradually work up to solid foods. ? ? ?Please notify your doctor immediately if you have any unusual bleeding, trouble breathing, redness and pain at the surgery site, drainage, fever, or pain not relieved by medication. ? ?  ? ?Your post-operative visit with Dr.                     ? ? ?           ?     is: Date:                        Time:   ? ?Please call to schedule your post-operative visit. ? ?Additional Instructions:  ? ? ? ? ? ? ? ? ? ? ? ? ?AMBULATORY SURGERY  ?DISCHARGE INSTRUCTIONS ? ? ?The drugs that you were given will stay in your system until tomorrow so for the next 24 hours you should not: ? ?Drive an automobile ?Make any legal decisions ?Drink any alcoholic beverage ? ? ?You may resume regular meals tomorrow.  Today it is better to start with liquids and gradually work up to solid foods. ? ?You may eat anything you prefer, but it is better to start with liquids, then soup and crackers, and gradually work up to solid foods. ? ? ?Please notify your doctor immediately if you have any unusual bleeding, trouble breathing, redness and pain at the surgery site, drainage, fever, or pain not relieved by medication. ?  ? ?Your post-operative visit with Dr.                     ? ? ?           ?     is: Date:                        Time:   ? ?Please call to schedule your post-operative visit. ? ?Additional Instructions:  ?

## 2021-08-28 NOTE — Transfer of Care (Signed)
Immediate Anesthesia Transfer of Care Note ? ?Patient: AZURE BUDNICK ? ?Procedure(s) Performed: XI ROBOTIC ASSISTED HYSTERECTOMY, BILATERAL SALPINGECTOMY (Bilateral: Pelvis) ? ?Patient Location: PACU ? ?Anesthesia Type:General ? ?Level of Consciousness: drowsy ? ?Airway & Oxygen Therapy: Patient Spontanous Breathing and Patient connected to face mask oxygen ? ?Post-op Assessment: Report given to RN and Post -op Vital signs reviewed and stable ? ?Post vital signs: Reviewed and stable ? ?Last Vitals:  ?Vitals Value Taken Time  ?BP 125/75 08/28/21 1615  ?Temp 35.9   ?Pulse 104 08/28/21 1617  ?Resp 17 08/28/21 1617  ?SpO2 100 % 08/28/21 1617  ?Vitals shown include unvalidated device data. ? ?Last Pain:  ?Vitals:  ? 08/28/21 1048  ?TempSrc: Temporal  ?PainSc: 2   ?   ? ?  ? ?Complications: No notable events documented. ?

## 2021-08-28 NOTE — Anesthesia Preprocedure Evaluation (Signed)
Anesthesia Evaluation  ?Patient identified by MRN, date of birth, ID band ?Patient awake ? ? ? ?Reviewed: ?Allergy & Precautions, NPO status , Patient's Chart, lab work & pertinent test results ? ?Airway ?Mallampati: III ? ?TM Distance: >3 FB ?Neck ROM: full ? ? ? Dental ? ?(+) Teeth Intact ?  ?Pulmonary ?neg pulmonary ROS, sleep apnea ,  ?  ?Pulmonary exam normal ? ?+ decreased breath sounds ? ? ? ? ? Cardiovascular ?Exercise Tolerance: Good ?hypertension, negative cardio ROS ?Normal cardiovascular exam ?Rhythm:Regular Rate:Normal ? ? ?  ?Neuro/Psych ?Anxiety Depression Bipolar Disorder CVA, No Residual Symptoms negative neurological ROS ? negative psych ROS  ? GI/Hepatic ?negative GI ROS, Neg liver ROS,   ?Endo/Other  ?negative endocrine ROSHypothyroidism  ? Renal/GU ?  ?negative genitourinary ?  ?Musculoskeletal ?Ankylosing Spondilitus  ? Abdominal ?(+) + obese,   ?Peds ?negative pediatric ROS ?(+)  Hematology ?negative hematology ROS ?(+) Blood dyscrasia, anemia ,   ?Anesthesia Other Findings ?Past Medical History: ?No date: Abnormal Pap smear of cervix ?No date: Arthritis ?No date: Arthritis, rheumatoid ([place]) ?No date: Atypical chest pain ?No date: Bipolar 1 disorder ([place]) ?No date: Headache ?    Comment:  MIGRAINES ?No date: Hyperlipidemia ?No date: Hypertension ?No date: Morbid obesity ([place]) ?    Comment:  a. 11/2018 s/p Robotic Sleeve Gastrectomy @ [facility]. ?No date: Palpitations ?    Comment:  a. 09/2017 Zio Monitor: predominantly RSR, avg HR 87, 11  ?             A tach/SVT episodes, fastest 176 x 7 beats, longest 9  ?             beats. Rare PAC's, occas PVC's, rare couplets/triplets. ?No date: Persistent Nausea and vomiting ?    Comment:  a. Following sleeve gastrectomy 11/2018 ?No date: PTSD (post-traumatic stress disorder) ?No date: PVC (premature ventricular contraction) ?No date: Social phobia ?No date: Stroke [facility]) ? ?Past Surgical History: ?No date:  CHOLECYSTECTOMY ?11/19/2018: LAPAROSCOPIC GASTRIC SLEEVE RESECTION ?09/19/2015: LAPAROSCOPIC TUBAL LIGATION; Bilateral ?    Comment:  Procedure: LAPAROSCOPIC BILATERAL TUBAL BANDING;   ?             Surgeon: Brayton Mars, MD;  Location: [facility] ORS;   ?             Service: Gynecology;  Laterality: Bilateral; ? ?BMI   ? Body Mass Index: 53.47 kg/m?  ?  ? ? Reproductive/Obstetrics ?negative OB ROS ? ?  ? ? ? ? ? ? ? ? ? ? ? ? ? ?  ?  ? ? ? ? ? ? ? ? ?Anesthesia Physical ?Anesthesia Plan ? ?ASA: 3 ? ?Anesthesia Plan: General  ? ?Post-op Pain Management:   ? ?Induction: Intravenous ? ?PONV Risk Score and Plan: 1 and Ondansetron and Dexamethasone ? ?Airway Management Planned: Oral ETT ? ?Additional Equipment:  ? ?Intra-op Plan:  ? ?Post-operative Plan: Extubation in OR ? ?Informed Consent: I have reviewed the patients History and Physical, chart, labs and discussed the procedure including the risks, benefits and alternatives for the proposed anesthesia with the patient or authorized representative who has indicated his/her understanding and acceptance.  ? ? ? ?Dental Advisory Given ? ?Plan Discussed with: CRNA and Surgeon ? ?Anesthesia Plan Comments:   ? ? ? ? ? ? ?Anesthesia Quick Evaluation ? ?

## 2021-08-29 ENCOUNTER — Encounter: Payer: Self-pay | Admitting: Obstetrics and Gynecology

## 2021-08-30 LAB — SURGICAL PATHOLOGY

## 2021-08-30 NOTE — Anesthesia Postprocedure Evaluation (Signed)
Anesthesia Post Note ? ?Patient: Leah Osborne ? ?Procedure(s) Performed: XI ROBOTIC ASSISTED HYSTERECTOMY, BILATERAL SALPINGECTOMY (Bilateral: Pelvis) ? ?Patient location during evaluation: PACU ?Anesthesia Type: General ?Level of consciousness: awake and alert ?Pain management: pain level controlled ?Vital Signs Assessment: post-procedure vital signs reviewed and stable ?Respiratory status: spontaneous breathing, nonlabored ventilation, respiratory function stable and patient connected to nasal cannula oxygen ?Cardiovascular status: blood pressure returned to baseline and stable ?Postop Assessment: no apparent nausea or vomiting ?Anesthetic complications: no ? ? ?No notable events documented. ? ? ?Last Vitals:  ?Vitals:  ? 08/28/21 1645 08/28/21 1707  ?BP: 118/68 (!) 144/95  ?Pulse: 79   ?Resp: 14 15  ?Temp: (!) 36.3 ?C (!) 36 ?C  ?SpO2: 96% 99%  ?  ?Last Pain:  ?Vitals:  ? 08/29/21 0837  ?TempSrc:   ?PainSc: 4   ? ? ?  ?  ?  ?  ?  ?  ? ?Molli Barrows ? ? ? ? ?

## 2021-09-08 ENCOUNTER — Encounter: Payer: Self-pay | Admitting: Family Medicine

## 2021-09-08 DIAGNOSIS — F314 Bipolar disorder, current episode depressed, severe, without psychotic features: Secondary | ICD-10-CM

## 2021-09-08 DIAGNOSIS — F411 Generalized anxiety disorder: Secondary | ICD-10-CM

## 2021-09-11 MED ORDER — BUPROPION HCL ER (XL) 150 MG PO TB24
150.0000 mg | ORAL_TABLET | Freq: Every day | ORAL | 0 refills | Status: DC
Start: 2021-09-11 — End: 2021-10-13

## 2021-09-12 ENCOUNTER — Ambulatory Visit (INDEPENDENT_AMBULATORY_CARE_PROVIDER_SITE_OTHER): Payer: Managed Care, Other (non HMO) | Admitting: Obstetrics and Gynecology

## 2021-09-12 ENCOUNTER — Encounter: Payer: Self-pay | Admitting: Obstetrics and Gynecology

## 2021-09-12 ENCOUNTER — Other Ambulatory Visit: Payer: Self-pay

## 2021-09-12 VITALS — BP 120/58 | HR 45 | Resp 16 | Ht 61.0 in | Wt 286.2 lb

## 2021-09-12 DIAGNOSIS — Z90710 Acquired absence of both cervix and uterus: Secondary | ICD-10-CM

## 2021-09-12 DIAGNOSIS — Z4889 Encounter for other specified surgical aftercare: Secondary | ICD-10-CM

## 2021-09-12 DIAGNOSIS — N3946 Mixed incontinence: Secondary | ICD-10-CM

## 2021-09-12 DIAGNOSIS — N808 Other endometriosis: Secondary | ICD-10-CM

## 2021-09-12 NOTE — Progress Notes (Signed)
? ? ?  OBSTETRICS/GYNECOLOGY POST-OPERATIVE CLINIC VISIT ? ?Subjective:  ?  ? Leah Osborne is a 36 y.o. female who presents to the clinic 2 weeks status post robotic-assisted  laparoscopic total hysterectomy with bilateral salpingectomy   for endometriosis and pelvic pain.  Also has issues with mixed urinary incontinence. Eating a regular diet without difficulty. Bowel movements are normal. The patient is not having any pain.  She does feel that her incontinence has not really improved as of yet. Is noting some bladder discomfort since her surgery if she holds her bladder for too long.  ? ?The following portions of the patient's history were reviewed and updated as appropriate: allergies, current medications, past family history, past medical history, past social history, past surgical history, and problem list. ? ?Review of Systems ?Pertinent items noted in HPI and remainder of comprehensive ROS otherwise negative. ?  ?Objective:  ? ?BP (!) 120/58   Pulse (!) 45   Resp 16   Ht '5\' 1"'$  (1.549 m)   Wt 286 lb 3.2 oz (129.8 kg)   BMI 54.08 kg/m?  Body mass index is 54.08 kg/m?. ? ?General:  alert and no distress  ?Abdomen: soft, bowel sounds active, non-tender  ?Incision:   healing well, no drainage, no erythema, no hernia, no seroma, no swelling, no dehiscence, incision well approximated  ? ? ?Pathology:  ? UTERUS WITH CERVIX AND BILATERAL FALLOPIAN TUBES; TOTAL HYSTERECTOMY  ?WITH BILATERAL SALPINGECTOMY:  ?- CERVIX WITH CHRONIC CERVICITIS AND NABOTHIAN CYSTS.  ?- PROLIFERATIVE ENDOMETRIUM.  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  ?- TWO FALLOPIAN TUBES WITH FIMBRIATED END.  ?- NEGATIVE FOR MALIGNANCY.  ? ?Assessment:  ? ?-  Patient s/p robotic assisted total laparoscopic hysterectomy and bilateral salpingectomy (surgery). Doing well post-operatively.  ?-  Mixed urinary incontinence ?-  Endometriosis ?  ?Plan:  ? ?1. Continue any current medications as instructed by provider.  Patient has  questions as to whether or not she should continue her Lupron injections (next one due in 2 weeks) now that she has had a hysterectomy. Advised that this was her option, if she wanted to continue suppressive therapy for the total 6-9 months, she should return in 2 weeks for her next injection.  Otherwise, can hold on injection and see if any residual pain occurs now that she has had a hysterectomy.  ?2. Wound care discussed. ?3. Operative findings again reviewed. Pathology report discussed. ?4. Activity restrictions: no bending, stooping, or squatting and no lifting more than 10-15 pounds, and pelvic rest x 6 weeks.  ?5. Anticipated return to work:  2-4 weeks .  Will notify when she decides to return as she will need a work notice.  ?6. Patient will make plans to follow up with UroGynecology after 6 weeks post-op appointment to continue plans for possible urethral bulking.  ?7. Follow up: 4 weeks for final post-op check.  ? ? ? ?Rubie Maid, MD ?[facility]  ? ?

## 2021-09-19 ENCOUNTER — Encounter: Payer: Self-pay | Admitting: Obstetrics and Gynecology

## 2021-09-22 ENCOUNTER — Ambulatory Visit: Payer: Managed Care, Other (non HMO)

## 2021-09-25 ENCOUNTER — Ambulatory Visit (INDEPENDENT_AMBULATORY_CARE_PROVIDER_SITE_OTHER): Payer: Managed Care, Other (non HMO) | Admitting: Obstetrics and Gynecology

## 2021-09-25 ENCOUNTER — Ambulatory Visit: Payer: Managed Care, Other (non HMO)

## 2021-09-25 DIAGNOSIS — N808 Other endometriosis: Secondary | ICD-10-CM

## 2021-09-25 DIAGNOSIS — Z3042 Encounter for surveillance of injectable contraceptive: Secondary | ICD-10-CM | POA: Diagnosis not present

## 2021-09-25 DIAGNOSIS — R102 Pelvic and perineal pain: Secondary | ICD-10-CM

## 2021-09-25 MED ORDER — LEUPROLIDE ACETATE 3.75 MG IM KIT: 3.7500 mg | PACK | Freq: Once | INTRAMUSCULAR | Status: DC

## 2021-09-25 MED ORDER — LEUPROLIDE ACETATE (3 MONTH) 11.25 MG IM KIT
11.2500 mg | PACK | Freq: Once | INTRAMUSCULAR | Status: AC
  Administered 2021-09-25: 11.25 mg via INTRAMUSCULAR

## 2021-09-25 NOTE — Progress Notes (Signed)
Patient came in today for Lupron Depot 11.25 mg. She tolerated injection well. She has had this medication in the past with no side effects and NKDA. This will be her last injection of Lupron.  ? ?

## 2021-09-26 ENCOUNTER — Encounter: Payer: Self-pay | Admitting: Obstetrics and Gynecology

## 2021-10-05 ENCOUNTER — Encounter: Payer: Self-pay | Admitting: Obstetrics and Gynecology

## 2021-10-05 MED ORDER — NORETHINDRONE ACETATE 5 MG PO TABS: 5.0000 mg | ORAL_TABLET | Freq: Every day | ORAL | 2 refills | Status: DC

## 2021-10-06 ENCOUNTER — Other Ambulatory Visit: Payer: Self-pay | Admitting: Internal Medicine

## 2021-10-06 ENCOUNTER — Encounter: Payer: Self-pay | Admitting: Internal Medicine

## 2021-10-06 ENCOUNTER — Ambulatory Visit: Payer: Managed Care, Other (non HMO) | Admitting: Internal Medicine

## 2021-10-06 ENCOUNTER — Ambulatory Visit: Payer: Self-pay

## 2021-10-06 VITALS — BP 132/88 | HR 94 | Temp 96.9°F | Wt 284.0 lb

## 2021-10-06 DIAGNOSIS — G8929 Other chronic pain: Secondary | ICD-10-CM

## 2021-10-06 DIAGNOSIS — R310 Gross hematuria: Secondary | ICD-10-CM | POA: Diagnosis not present

## 2021-10-06 DIAGNOSIS — R8290 Unspecified abnormal findings in urine: Secondary | ICD-10-CM

## 2021-10-06 DIAGNOSIS — N898 Other specified noninflammatory disorders of vagina: Secondary | ICD-10-CM | POA: Diagnosis not present

## 2021-10-06 DIAGNOSIS — Z90710 Acquired absence of both cervix and uterus: Secondary | ICD-10-CM

## 2021-10-06 DIAGNOSIS — M545 Low back pain, unspecified: Secondary | ICD-10-CM | POA: Diagnosis not present

## 2021-10-06 LAB — POCT URINALYSIS DIPSTICK
Bilirubin, UA: NEGATIVE
Glucose, UA: NEGATIVE
Ketones, UA: NEGATIVE
Nitrite, UA: NEGATIVE
Protein, UA: POSITIVE — AB
Spec Grav, UA: 1.025 (ref 1.010–1.025)
Urobilinogen, UA: 0.2 E.U./dL
pH, UA: 5 (ref 5.0–8.0)

## 2021-10-06 MED ORDER — NITROFURANTOIN MONOHYD MACRO 100 MG PO CAPS: 100.0000 mg | ORAL_CAPSULE | Freq: Two times a day (BID) | ORAL | 0 refills | Status: DC

## 2021-10-06 MED ORDER — FLUCONAZOLE 150 MG PO TABS: 150.0000 mg | ORAL_TABLET | Freq: Once | ORAL | 0 refills | Status: AC

## 2021-10-06 NOTE — Telephone Encounter (Signed)
?  Chief Complaint: Vaginal dryness, Cloudy Urine, itching, no discharge ?Symptoms: IBID ?Frequency: 2-3 days ?Pertinent Negatives: Patient denies Discharge. ?Disposition: '[]'$ ED /'[]'$ Urgent Care (no appt availability in office) / '[x]'$ Appointment(In office/virtual)/ '[]'$  [facility]/ '[]'$ Home Care/ '[]'$ Refused Recommended Disposition /'[]'$ [facility]/ '[]'$  Follow-up with PCP ?Additional Notes: Pt diagnosed with vaginal dryness and given medication for same. Pt believes that this is a UTI. Gyn unable to see pt. Pt has back pain but is unsure if this is significant as she always has back pain.  ? ? ? ?Summary: advice- possible UTI  ? Pt was recently seen by her OBGYN and think she may have been misdiagnosed and believes it might just be a UTI, pt wanted to be seen but no open appts today, please advise.   ?  ? ?Reason for Disposition ? All other urine symptoms ? ?Protocols used: Urinary Symptoms-A-AH ? ?

## 2021-10-06 NOTE — Patient Instructions (Signed)
Urinary Tract Infection, Adult A urinary tract infection (UTI) is an infection of any part of the urinary tract. The urinary tract includes: The kidneys. The ureters. The bladder. The urethra. These organs make, store, and get rid of pee (urine) in the body. What are the causes? This infection is caused by germs (bacteria) in your genital area. These germs grow and cause swelling (inflammation) of your urinary tract. What increases the risk? The following factors may make you more likely to develop this condition: Using a small, thin tube (catheter) to drain pee. Not being able to control when you pee or poop (incontinence). Being female. If you are female, these things can increase the risk: Using these methods to prevent pregnancy: A medicine that kills sperm (spermicide). A device that blocks sperm (diaphragm). Having low levels of a female hormone (estrogen). Being pregnant. You are more likely to develop this condition if: You have genes that add to your risk. You are sexually active. You take antibiotic medicines. You have trouble peeing because of: A prostate that is bigger than normal, if you are female. A blockage in the part of your body that drains pee from the bladder. A kidney stone. A nerve condition that affects your bladder. Not getting enough to drink. Not peeing often enough. You have other conditions, such as: Diabetes. A weak disease-fighting system (immune system). Sickle cell disease. Gout. Injury of the spine. What are the signs or symptoms? Symptoms of this condition include: Needing to pee right away. Peeing small amounts often. Pain or burning when peeing. Blood in the pee. Pee that smells bad or not like normal. Trouble peeing. Pee that is cloudy. Fluid coming from the vagina, if you are female. Pain in the belly or lower back. Other symptoms include: Vomiting. Not feeling hungry. Feeling mixed up (confused). This may be the first symptom in  older adults. Being tired and grouchy (irritable). A fever. Watery poop (diarrhea). How is this treated? Taking antibiotic medicine. Taking other medicines. Drinking enough water. In some cases, you may need to see a specialist. Follow these instructions at home:  Medicines Take over-the-counter and prescription medicines only as told by your doctor. If you were prescribed an antibiotic medicine, take it as told by your doctor. Do not stop taking it even if you start to feel better. General instructions Make sure you: Pee until your bladder is empty. Do not hold pee for a long time. Empty your bladder after sex. Wipe from front to back after peeing or pooping if you are a female. Use each tissue one time when you wipe. Drink enough fluid to keep your pee pale yellow. Keep all follow-up visits. Contact a doctor if: You do not get better after 1-2 days. Your symptoms go away and then come back. Get help right away if: You have very bad back pain. You have very bad pain in your lower belly. You have a fever. You have chills. You feeling like you will vomit or you vomit. Summary A urinary tract infection (UTI) is an infection of any part of the urinary tract. This condition is caused by germs in your genital area. There are many risk factors for a UTI. Treatment includes antibiotic medicines. Drink enough fluid to keep your pee pale yellow. This information is not intended to replace advice given to you by your health care provider. Make sure you discuss any questions you have with your health care provider. Document Revised: 01/15/2020 Document Reviewed: 01/15/2020 Elsevier Patient Education    2023 Elsevier Inc.  

## 2021-10-06 NOTE — Telephone Encounter (Signed)
Will discuss at upcoming appt.

## 2021-10-06 NOTE — Progress Notes (Signed)
HPI ? ?Pt presents to the clinic today with c/o vaginal dryness, vaginal itching, cloudy urine, urine odor, blood in her urine and low back pain. This started 3 days ago. She has had some nausea, but denies fever, chills or body aches.  She reports low back pain but has chronic low back pain and is unsure if this is worse or not.  She saw GYN for the same and they thought that this could be hormonal related to her recent hysterectomy.  They started her on OCPs.  She has tried Replenish OTC with minimal relief.  ? ? ?Review of Systems ? ?Past Medical History:  ?Diagnosis Date  ? Abnormal Pap smear of cervix   ? Arthritis   ? Arthritis, rheumatoid ([place])   ? Atypical chest pain   ? Bipolar 1 disorder ([place])   ? Headache   ? MIGRAINES  ? Hyperlipidemia   ? Hypertension   ? Morbid obesity ([place])   ? a. 11/2018 s/p Robotic Sleeve Gastrectomy @ [facility].  ? Palpitations   ? a. 09/2017 Zio Monitor: predominantly RSR, avg HR 87, 11 A tach/SVT episodes, fastest 176 x 7 beats, longest 9 beats. Rare PAC's, occas PVC's, rare couplets/triplets.  ? Persistent Nausea and vomiting   ? a. Following sleeve gastrectomy 11/2018  ? PTSD (post-traumatic stress disorder)   ? PVC (premature ventricular contraction)   ? Social phobia   ? Stroke [facility])   ? ? ?Family History  ?Problem Relation Age of Onset  ? Diabetes Maternal Grandmother   ? Heart disease Maternal Grandmother   ? Heart failure Maternal Grandmother   ? Heart attack Maternal Grandmother   ? Rheum arthritis Mother   ? Healthy Father   ? Heart Problems Maternal Grandfather   ? Stroke Maternal Grandfather   ? Atrial fibrillation Paternal Grandmother   ? Stroke Other   ?     mat great gf had stroke at 29  ? Cancer Neg Hx   ? ? ?Social History  ? ?Socioeconomic History  ? Marital status: Married  ?  Spouse name: Not on file  ? Number of children: Not on file  ? Years of education: Not on file  ? Highest education level: Not on file  ?Occupational History  ? Not on file  ?Tobacco Use  ? Smoking  status: Never  ? Smokeless tobacco: Never  ?Vaping Use  ? Vaping Use: Never used  ?Substance and Sexual Activity  ? Alcohol use: Not Currently  ?  Alcohol/week: 0.0 standard drinks  ? Drug use: No  ? Sexual activity: Yes  ?  Birth control/protection: Surgical  ?  Comment: tubal  ?Other Topics Concern  ? Not on file  ?Social History Narrative  ? Lives in [place] with husband  ? Right handed  ? ?Social Determinants of Health  ? ?Financial Resource Strain: Not on file  ?Food Insecurity: Not on file  ?Transportation Needs: Not on file  ?Physical Activity: Not on file  ?Stress: Not on file  ?Social Connections: Not on file  ?Intimate Partner Violence: Not on file  ? ? ?No Known Allergies ?  ?Constitutional: Denies fever, malaise, fatigue, headache or abrupt weight changes.   ?GU: Pt reports urine odor, cloudy urine, blood in urine, vaginal itching and vaginal dryness. Denies or discharge. ?Skin: Denies redness, rashes, lesions or ulcercations.  ? ?No other specific complaints in a complete review of systems (except as listed in HPI above). ? ?  ?Objective:  ? Physical Exam ?BP  132/88 (BP Location: Left Arm, Patient Position: Sitting, Cuff Size: Large)   Pulse 94   Temp (!) 96.9 ?F (36.1 ?C) (Temporal)   Wt 284 lb (128.8 kg)   SpO2 98%   BMI 53.66 kg/m?  ? ?Wt Readings from Last 3 Encounters:  ?10/06/21 284 lb (128.8 kg)  ?09/12/21 286 lb 3.2 oz (129.8 kg)  ?08/28/21 283 lb (128.4 kg)  ? ? ?General: Appears her stated age, obese, in NAD. ?Cardiovascular: Normal rate and rhythm.  No murmurs noted. ?Pulmonary/Chest: Normal effort. ?Abdomen: Soft and nontender over the bladder. No CVA tenderness. ? ? ?     ?Assessment & Plan:  ? ?Urine Odor, Cloudy Urine, Blood in Urine, Low Back Pain, Vaginal Itching, Vaginal Dryness: ? ?Urinalysis: 3+ leuks, 2+ blood ?Will send urine culture ?eRx sent if for Macrobid 100 mg BID x 5 days ?RX for Diflucan for possible yeast infection ?OK to take AZO OTC ?Drink plenty of fluids ? ?If  vaginal symptoms persist, would recommend follow up with GYN ?Webb Silversmith, NP ? ?

## 2021-10-07 LAB — URINE CULTURE
MICRO NUMBER:: 13296612
SPECIMEN QUALITY:: ADEQUATE

## 2021-10-09 ENCOUNTER — Encounter: Payer: Self-pay | Admitting: Internal Medicine

## 2021-10-09 NOTE — Progress Notes (Signed)
? ? ?  OBSTETRICS/GYNECOLOGY POST-OPERATIVE CLINIC VISIT ? ?Subjective:  ?  ? Leah Osborne is a 36 y.o. female who presents to the clinic 2 weeks status post robotic-assisted  laparoscopic total hysterectomy with bilateral salpingectomy   for endometriosis and pelvic pain.  Also has issues with mixed urinary incontinence. Eating a regular diet without difficulty. Bowel movements are normal. The patient is not having any pain.  She did experience some vaginal bleeding last week but this has resolved. Was also experiencing some dryness and vaginal itching. States that she went to her PCP as she could not get an appointment with our office, Had urine checked which noted some blood and bacteria, was started on Macrobid but notes that her culture returned negative. Was also given treatment for possible yeast infection.  ? ?The following portions of the patient's history were reviewed and updated as appropriate: allergies, current medications, past family history, past medical history, past social history, past surgical history, and problem list. ? ?Review of Systems ?Pertinent items noted in HPI and remainder of comprehensive ROS otherwise negative. ?  ?Objective:  ? ?BP (!) 134/91   Pulse 65   Resp 16   Ht '5\' 1"'$  (1.549 m)   Wt 278 lb 1.6 oz (126.1 kg)   BMI 52.55 kg/m?  Body mass index is 52.55 kg/m?. ? ?General:  alert and no distress  ?Abdomen: soft, bowel sounds active, non-tender  ?Incision:   healing well, no drainage, no erythema, no hernia, no seroma, no swelling, no dehiscence, incision well approximated  ?Pelvis:   External genitalia with several sebaceous cysts on left labia minora. Vagina with no discharge or lesions, vaginal cuff healing well, scant suture material present. Uterus and cervix surgically absent.   ? ? ?Pathology:  ? UTERUS WITH CERVIX AND BILATERAL FALLOPIAN TUBES; TOTAL HYSTERECTOMY  ?WITH BILATERAL SALPINGECTOMY:  ?- CERVIX WITH CHRONIC CERVICITIS AND NABOTHIAN CYSTS.  ?-  PROLIFERATIVE ENDOMETRIUM.  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  ?- TWO FALLOPIAN TUBES WITH FIMBRIATED END.  ?- NEGATIVE FOR MALIGNANCY.  ? ?Assessment:  ? ?-  Patient s/p robotic assisted total laparoscopic hysterectomy and bilateral salpingectomy (surgery). Doing well post-operatively.  ?-  Mixed urinary incontinence ?-  Endometriosis ?  ?Plan:  ? ?1. Continue any current medications as instructed by provider.  Also recently received Lupron injection 4/10. ?2. Wound care discussed.  ?3. Operative findings again reviewed.  ?4. Activity restrictions: no bending, stooping, or squatting and no lifting more than 10-15 pounds, and pelvic rest x 2 weeks.  ?5. Anticipated return to work:  now .  ?6. Patient will make plans to follow up with UroGynecology in the next several months to continue plans for possible urethral bulking.  ?7. Follow up: 4 weeks for final post-op check.  ? ? ?Rubie Maid, MD ?[facility]  ? ?

## 2021-10-11 ENCOUNTER — Encounter: Payer: Self-pay | Admitting: Obstetrics and Gynecology

## 2021-10-11 ENCOUNTER — Ambulatory Visit (INDEPENDENT_AMBULATORY_CARE_PROVIDER_SITE_OTHER): Payer: Managed Care, Other (non HMO) | Admitting: Obstetrics and Gynecology

## 2021-10-11 VITALS — BP 134/91 | HR 65 | Resp 16 | Ht 61.0 in | Wt 278.1 lb

## 2021-10-11 DIAGNOSIS — N3946 Mixed incontinence: Secondary | ICD-10-CM

## 2021-10-11 DIAGNOSIS — N808 Other endometriosis: Secondary | ICD-10-CM

## 2021-10-11 DIAGNOSIS — Z90710 Acquired absence of both cervix and uterus: Secondary | ICD-10-CM

## 2021-10-11 DIAGNOSIS — Z4889 Encounter for other specified surgical aftercare: Secondary | ICD-10-CM

## 2021-10-12 ENCOUNTER — Other Ambulatory Visit: Payer: Self-pay | Admitting: Family Medicine

## 2021-10-12 DIAGNOSIS — F411 Generalized anxiety disorder: Secondary | ICD-10-CM

## 2021-10-12 DIAGNOSIS — F314 Bipolar disorder, current episode depressed, severe, without psychotic features: Secondary | ICD-10-CM

## 2021-10-13 ENCOUNTER — Encounter: Payer: Self-pay | Admitting: Family Medicine

## 2021-10-13 DIAGNOSIS — F314 Bipolar disorder, current episode depressed, severe, without psychotic features: Secondary | ICD-10-CM

## 2021-10-13 MED ORDER — BUPROPION HCL ER (XL) 150 MG PO TB24: 150.0000 mg | ORAL_TABLET | Freq: Every day | ORAL | 1 refills | Status: DC

## 2021-10-13 NOTE — Telephone Encounter (Signed)
Requested medication (s) are due for refill today - yes ? ?Requested medication (s) are on the active medication list - yes ? ?Future visit scheduled -no ? ?Last refill: 09/11/21 #30 ? ?Notes to clinic: Request RF: fails lab protocol ? ?Requested Prescriptions  ?Pending Prescriptions Disp Refills  ? buPROPion (WELLBUTRIN XL) 150 MG 24 hr tablet [Pharmacy Med Name: BUPROPION XL '150MG'$  TABLETS (24 H)] 30 tablet 0  ?  Sig: TAKE 1 TABLET(150 MG) BY MOUTH DAILY  ?  ? Psychiatry: Antidepressants - bupropion Failed - 10/12/2021  4:51 PM  ?  ?  Failed - AST in normal range and within 360 days  ?  AST  ?Date Value Ref Range Status  ?02/19/2019 30 0 - 40 IU/L Final  ? ?SGOT(AST)  ?Date Value Ref Range Status  ?07/19/2011 19 15 - 37 Unit/L Final  ?  ?  ?  ?  Failed - ALT in normal range and within 360 days  ?  ALT  ?Date Value Ref Range Status  ?02/19/2019 36 (H) 0 - 32 IU/L Final  ? ?SGPT (ALT)  ?Date Value Ref Range Status  ?07/19/2011 29 U/L Final  ?  Comment:  ?  12-78 ?NOTE: NEW REFERENCE RANGE ?05/11/2011 ?  ?  ?  ?  ?  Failed - Last BP in normal range  ?  BP Readings from Last 1 Encounters:  ?10/11/21 (!) 134/91  ?  ?  ?  ?  Passed - Cr in normal range and within 360 days  ?  Creatinine  ?Date Value Ref Range Status  ?01/16/2021 0.9 0.5 - 1.1 Final  ? ?Creat  ?Date Value Ref Range Status  ?01/28/2019 0.88 0.50 - 1.10 mg/dL Final  ? ?Creatinine, Ser  ?Date Value Ref Range Status  ?11/09/2020 0.89 0.57 - 1.00 mg/dL Final  ? ?Creatinine, Urine  ?Date Value Ref Range Status  ?04/08/2015 92 mg/dL Final  ?  ?  ?  ?  Passed - Completed PHQ-2 or PHQ-9 in the last 360 days  ?  ?  Passed - Valid encounter within last 6 months  ?  Recent Outpatient Visits   ? ?      ? 1 week ago Abnormal urine odor  ? [facility] [place], [place] W, NP  ? 1 month ago Morbid obesity with BMI of 50.0-59.9, adult [facility])  ? [place], DO  ? 2 months ago Acute non-recurrent maxillary sinusitis  ?  [facility] [place], [place] W, NP  ? 7 months ago Hypothyroidism, unspecified type  ? [place], DO  ? 1 year ago Patient left without being seen  ? [place], DO  ? ?  ?  ?Future Appointments   ? ?        ? In 2 months [place], [facility], [place]  ? In 3 months Ralene Bathe, MD [place]  ? ?  ? ? ?  ?  ?  ? ? ? ?Requested Prescriptions  ?Pending Prescriptions Disp Refills  ? buPROPion (WELLBUTRIN XL) 150 MG 24 hr tablet [Pharmacy Med Name: BUPROPION XL '150MG'$  TABLETS (24 H)] 30 tablet 0  ?  Sig: TAKE 1 TABLET(150 MG) BY MOUTH DAILY  ?  ? Psychiatry: Antidepressants - bupropion Failed - 10/12/2021  4:51 PM  ?  ?  Failed - AST in normal range and within  360 days  ?  AST  ?Date Value Ref Range Status  ?02/19/2019 30 0 - 40 IU/L Final  ? ?SGOT(AST)  ?Date Value Ref Range Status  ?07/19/2011 19 15 - 37 Unit/L Final  ?  ?  ?  ?  Failed - ALT in normal range and within 360 days  ?  ALT  ?Date Value Ref Range Status  ?02/19/2019 36 (H) 0 - 32 IU/L Final  ? ?SGPT (ALT)  ?Date Value Ref Range Status  ?07/19/2011 29 U/L Final  ?  Comment:  ?  12-78 ?NOTE: NEW REFERENCE RANGE ?05/11/2011 ?  ?  ?  ?  ?  Failed - Last BP in normal range  ?  BP Readings from Last 1 Encounters:  ?10/11/21 (!) 134/91  ?  ?  ?  ?  Passed - Cr in normal range and within 360 days  ?  Creatinine  ?Date Value Ref Range Status  ?01/16/2021 0.9 0.5 - 1.1 Final  ? ?Creat  ?Date Value Ref Range Status  ?01/28/2019 0.88 0.50 - 1.10 mg/dL Final  ? ?Creatinine, Ser  ?Date Value Ref Range Status  ?11/09/2020 0.89 0.57 - 1.00 mg/dL Final  ? ?Creatinine, Urine  ?Date Value Ref Range Status  ?04/08/2015 92 mg/dL Final  ?  ?  ?  ?  Passed - Completed PHQ-2 or PHQ-9 in the last 360 days  ?  ?  Passed - Valid encounter within last 6 months  ?  Recent Outpatient Visits   ? ?      ? 1 week ago Abnormal urine odor  ? [facility] [place], [place] W, NP  ? 1 month ago Morbid obesity with BMI of 50.0-59.9, adult [facility])  ? [place], DO  ? 2 months ago Acute non-recurrent maxillary sinusitis  ? [facility] [place], [place] W, NP  ? 7 months ago Hypothyroidism, unspecified type  ? [place], DO  ? 1 year ago Patient left without being seen  ? [place], DO  ? ?  ?  ?Future Appointments   ? ?        ? In 2 months Parks Ranger, [facility], [place]  ? In 3 months Ralene Bathe, MD [place]  ? ?  ? ? ?  ?  ?  ? ? ? ?

## 2021-10-16 ENCOUNTER — Other Ambulatory Visit: Payer: Self-pay | Admitting: Cardiovascular Disease

## 2021-10-16 ENCOUNTER — Other Ambulatory Visit: Payer: Self-pay | Admitting: Adult Health

## 2021-10-16 DIAGNOSIS — I6930 Unspecified sequelae of cerebral infarction: Secondary | ICD-10-CM

## 2021-10-16 MED ORDER — BUPROPION HCL ER (XL) 300 MG PO TB24: 300.0000 mg | ORAL_TABLET | Freq: Every day | ORAL | 3 refills | Status: DC

## 2021-10-18 ENCOUNTER — Other Ambulatory Visit: Payer: Self-pay | Admitting: Adult Health

## 2021-10-18 DIAGNOSIS — I6930 Unspecified sequelae of cerebral infarction: Secondary | ICD-10-CM

## 2021-10-26 ENCOUNTER — Encounter: Payer: Self-pay | Admitting: Adult Health

## 2021-10-26 DIAGNOSIS — I6930 Unspecified sequelae of cerebral infarction: Secondary | ICD-10-CM

## 2021-10-30 NOTE — Telephone Encounter (Signed)
Agree with your recommendation especially with prior stroke history.  She does also have history of migraine headaches which may be responsible for symptoms but unable to fully determine this without further evaluation.  Would recommend seeing if Dr. Jaynee Eagles has availability to see patient for these new concerns (if declines ED eval or if symptoms persist despite ED eval).  Unfortunately, I have no openings today and will be out of the office after today until the end of this month. Thank you.  ?

## 2021-10-31 ENCOUNTER — Emergency Department: Payer: Managed Care, Other (non HMO)

## 2021-10-31 ENCOUNTER — Other Ambulatory Visit: Payer: Self-pay

## 2021-10-31 ENCOUNTER — Emergency Department
Admission: EM | Admit: 2021-10-31 | Discharge: 2021-10-31 | Disposition: A | Discharge status: Home / Self Care | Payer: Managed Care, Other (non HMO) | Attending: Emergency Medicine | Admitting: Emergency Medicine

## 2021-10-31 DIAGNOSIS — R200 Anesthesia of skin: Secondary | ICD-10-CM | POA: Diagnosis present

## 2021-10-31 DIAGNOSIS — R202 Paresthesia of skin: Secondary | ICD-10-CM | POA: Diagnosis not present

## 2021-10-31 DIAGNOSIS — R519 Headache, unspecified: Secondary | ICD-10-CM | POA: Diagnosis not present

## 2021-10-31 DIAGNOSIS — R791 Abnormal coagulation profile: Secondary | ICD-10-CM | POA: Insufficient documentation

## 2021-10-31 LAB — CBC WITH DIFFERENTIAL/PLATELET
Abs Immature Granulocytes: 0.02 10*3/uL (ref 0.00–0.07)
Basophils Absolute: 0.1 10*3/uL (ref 0.0–0.1)
Basophils Relative: 1 %
Eosinophils Absolute: 0.2 10*3/uL (ref 0.0–0.5)
Eosinophils Relative: 2 %
HCT: 40.1 % (ref 36.0–46.0)
Hemoglobin: 12.4 g/dL (ref 12.0–15.0)
Immature Granulocytes: 0 %
Lymphocytes Relative: 31 %
Lymphs Abs: 2.5 10*3/uL (ref 0.7–4.0)
MCH: 25.1 pg — ABNORMAL LOW (ref 26.0–34.0)
MCHC: 30.9 g/dL (ref 30.0–36.0)
MCV: 81 fL (ref 80.0–100.0)
Monocytes Absolute: 0.7 10*3/uL (ref 0.1–1.0)
Monocytes Relative: 8 %
Neutro Abs: 4.7 10*3/uL (ref 1.7–7.7)
Neutrophils Relative %: 58 %
Platelets: 409 10*3/uL — ABNORMAL HIGH (ref 150–400)
RBC: 4.95 MIL/uL (ref 3.87–5.11)
RDW: 14.8 % (ref 11.5–15.5)
WBC: 8.2 10*3/uL (ref 4.0–10.5)
nRBC: 0 % (ref 0.0–0.2)

## 2021-10-31 LAB — COMPREHENSIVE METABOLIC PANEL
ALT: 17 U/L (ref 0–44)
AST: 19 U/L (ref 15–41)
Albumin: 4.1 g/dL (ref 3.5–5.0)
Alkaline Phosphatase: 83 U/L (ref 38–126)
Anion gap: 9 (ref 5–15)
BUN: 18 mg/dL (ref 6–20)
CO2: 27 mmol/L (ref 22–32)
Calcium: 9.6 mg/dL (ref 8.9–10.3)
Chloride: 105 mmol/L (ref 98–111)
Creatinine, Ser: 0.98 mg/dL (ref 0.44–1.00)
GFR, Estimated: >60 mL/min (ref >60)
Glucose, Bld: 95 mg/dL (ref 70–99)
Potassium: 4 mmol/L (ref 3.5–5.1)
Sodium: 141 mmol/L (ref 135–145)
Total Bilirubin: 0.5 mg/dL (ref 0.3–1.2)
Total Protein: 7.3 g/dL (ref 6.5–8.1)

## 2021-10-31 LAB — PROTIME-INR
INR: 1 (ref 0.8–1.2)
Prothrombin Time: 13.4 seconds (ref 11.4–15.2)

## 2021-10-31 LAB — CBG MONITORING, ED: Glucose-Capillary: 104 mg/dL — ABNORMAL HIGH (ref 70–99)

## 2021-10-31 LAB — TROPONIN I (HIGH SENSITIVITY): Troponin I (High Sensitivity): <2 ng/L (ref <18)

## 2021-10-31 LAB — APTT: aPTT: 31 seconds (ref 24–36)

## 2021-10-31 MED ORDER — DIAZEPAM 5 MG PO TABS
5.0000 mg | ORAL_TABLET | Freq: Once | ORAL | Status: AC | PRN
  Administered 2021-10-31: 5 mg via ORAL
  Filled 2021-10-31: qty 1

## 2021-10-31 MED ORDER — GADOBUTROL 1 MMOL/ML IV SOLN
10.0000 mL | Freq: Once | INTRAVENOUS | Status: AC | PRN
  Administered 2021-10-31: 10 mL via INTRAVENOUS

## 2021-10-31 NOTE — ED Triage Notes (Signed)
Pt comes with c/o left sided numbness to top of head that started on Thursday. Pt states right eye twitching for two weeks. Pt states trouble speaking today. Pt currently speaking in complete sentences with no difficulty.. ? ?Pt states right sided headache. Pt states she take daily aspirin. Pt states hx of stroke in past. ?

## 2021-10-31 NOTE — ED Provider Triage Note (Signed)
Emergency Medicine Provider Triage Evaluation Note ? ?[place] , a 36 y.o. female  was evaluated in triage.  Pt complains of numbness on the right side of the face, numbness in the right arm and hand, history of stroke like activity in 2020.  Patient feels like she is having the same.  Symptoms started 2 days ago. ? ?Review of Systems  ?Positive: Numbness of the face and arm ?Negative: Chest pain shortness of breath ? ?Physical Exam  ?There were no vitals taken for this visit. ?Gen:   Awake, no distress   ?Resp:  Normal effort  ?MSK:   Moves extremities without difficulty  ?Other:   ? ?Medical Decision Making  ?Medically screening exam initiated at 11:28 AM.  Appropriate orders placed.  [place] was informed that the remainder of the evaluation will be completed by another provider, this initial triage assessment does not replace that evaluation, and the importance of remaining in the ED until their evaluation is complete. ? ? ?  ?Versie Starks, PA-C ?10/31/21 1128 ? ?

## 2021-10-31 NOTE — ED Notes (Signed)
Vaughan Basta RN confirmed on time to give valium with CT staff.  ?

## 2021-10-31 NOTE — Discharge Instructions (Addendum)
No driving today as you were given a sedative medication for your MRI. ? ?Please continue your current medications, follow-up with Dr. Jaynee Eagles. ?

## 2021-10-31 NOTE — ED Notes (Signed)
Pt up to restroom; offered wheelchair ride to and from but pt refused. Pt steady currently. Pt has urine specimen cup with her in case sample needed later. ?

## 2021-10-31 NOTE — ED Notes (Signed)
Pt reports her speech never slurred but at one point she had difficulty getting words out that she wanted to; aphasia. Currently pt does not display aphasia.  ?

## 2021-10-31 NOTE — ED Provider Notes (Signed)
? ?[facility] ?Provider Note ? ? ? Event Date/Time  ? First MD Initiated Contact with Patient 10/31/21 1308   ?  (approximate) ? ? ?History  ? ?Headache ? ? ?HPI ? ?Leah Osborne is a 36 y.o. female who on review of clinic note from April of this year has a history of total hysterectomy ? ?Also on review of neurology note from May of last year has a history of previous stroke with residual deficit, chronic migraines, hyperlipidemia and was undergoing work-up for central demyelinating process ? ?Patient reports that about 2 weeks ago she had a headache over the right scalp, that got better and then she had numbness over the left scalp.  She has felt a bit of a tingly sensation over the left scalp since then, and then sometime around Thursday or Friday noticed her right hand felt slightly tingly and that may be a little bit fatigued or weak. ? ?She called her neurologist office and was advised to come to the ER.  She has had a stroke in the past and is on aspirin therapy ? ?No chest pain no nausea vomiting.  Reports she is recovering very well without issue after her hysterectomy ?  ? ? ?Physical Exam  ? ?Triage Vital Signs: ?ED Triage Vitals  ?Enc Vitals Group  ?   BP 10/31/21 1140 (!) 140/103  ?   Pulse Rate 10/31/21 1140 69  ?   Resp 10/31/21 1140 19  ?   Temp 10/31/21 1140 98.2 ?F (36.8 ?C)  ?   Temp src --   ?   SpO2 10/31/21 1140 93 %  ?   Weight --   ?   Height --   ?   Head Circumference --   ?   Peak Flow --   ?   Pain Score 10/31/21 1138 6  ?   Pain Loc --   ?   Pain Edu? --   ?   Excl. in [place]? --   ? ? ?Most recent vital signs: ?Vitals:  ? 10/31/21 1329 10/31/21 1330  ?BP:  126/67  ?Pulse: 84 77  ?Resp:    ?Temp:    ?SpO2: 100% 99%  ? ? ? ?General: Awake, no distress.  ?CV:  Good peripheral perfusion.  ?Resp:  Normal effort.  ?Abd:  No distention.  ?Other:  No pronator drift.  Symmetric facial movements and smile.  Extraocular movements normal.  No ataxia in the upper extremities  bilaterally.  Patient reports a feeling of slight weakness in the right hand with grip strength, but I not able to discern this objectively but subjectively she reports the right hand feels slightly weak.  Normal sensation across the face scalp arms and legs bilaterally.  No pronator drift in any extremity.  Very much reassuring neurologic exam at this time ? ? ?ED Results / Procedures / Treatments  ? ?Labs ?(all labs ordered are listed, but only abnormal results are displayed) ?Labs Reviewed  ?CBC WITH DIFFERENTIAL/PLATELET - Abnormal; Notable for the following components:  ?    Result Value  ? MCH 25.1 (*)   ? Platelets 409 (*)   ? All other components within normal limits  ?CBG MONITORING, ED - Abnormal; Notable for the following components:  ? Glucose-Capillary 104 (*)   ? All other components within normal limits  ?COMPREHENSIVE METABOLIC PANEL  ?PROTIME-INR  ?APTT  ?TROPONIN I (HIGH SENSITIVITY)  ? ? ? ?EKG ? ?Reviewed by me at 1140 ?Heart  rate 75 ?QRS 85 ?QTc 400 ?Normal sinus rhythm no evidence of acute ischemia ? ? ?RADIOLOGY ? ?CT HEAD WO CONTRAST (5MM) ? ?Result Date: 10/31/2021 ?CLINICAL DATA:  Neuro deficit, stroke suspected EXAM: CT HEAD WITHOUT CONTRAST TECHNIQUE: Contiguous axial images were obtained from the base of the skull through the vertex without intravenous contrast. RADIATION DOSE REDUCTION: This exam was performed according to the departmental dose-optimization program which includes automated exposure control, adjustment of the mA and/or kV according to patient size and/or use of iterative reconstruction technique. COMPARISON:  09/05/2006, correlation is also made with MRI head 04/14/2019 FINDINGS: Brain: No evidence of acute infarction, hemorrhage, cerebral edema, mass, mass effect, or midline shift. No hydrocephalus or extra-axial fluid collection. Vascular: No hyperdense vessel. Skull: Normal. Negative for fracture or focal lesion. Sinuses/Orbits: No acute finding. Other: The mastoid air  cells are well aerated. IMPRESSION: No acute intracranial process. Electronically Signed   By: Merilyn Baba M.D.   On: 10/31/2021 11:52  ? ?MR Brain W and Wo Contrast ? ?Result Date: 10/31/2021 ?CLINICAL DATA:  Numbness on the right side of face, right arm, and right hand EXAM: MRI HEAD WITHOUT AND WITH CONTRAST TECHNIQUE: Multiplanar, multiecho pulse sequences of the brain and surrounding structures were obtained without and with intravenous contrast. CONTRAST:  39m GADAVIST GADOBUTROL 1 MMOL/ML IV SOLN COMPARISON:  04/14/2019 FINDINGS: Brain: No restricted diffusion to suggest acute or subacute infarct. No acute hemorrhage, mass, mass effect, or midline shift. No hydrocephalus or extra-axial collection. No abnormal parenchymal or meningeal enhancement. Lacunar infarcts in the left lentiform nucleus and caudate head, which were present on the prior exam. No evidence of additional interval infarct. Vascular: Normal flow voids. Patent venous sinuses on postcontrast imaging. Skull and upper cervical spine: Normal marrow signal. Sinuses/Orbits: No acute finding. Other: The mastoids are well aerated. IMPRESSION: No acute intracranial process. No etiology is seen for the patient's numbness. Electronically Signed   By: AMerilyn BabaM.D.   On: 10/31/2021 14:57  ? ?MR Cervical Spine W or Wo Contrast ? ?Result Date: 10/31/2021 ?CLINICAL DATA:  Neck pain, acute. Numbness on right side of the face, right arm and hand. EXAM: MRI CERVICAL SPINE WITHOUT AND WITH CONTRAST TECHNIQUE: Multiplanar and multiecho pulse sequences of the cervical spine, to include the craniocervical junction and cervicothoracic junction, were obtained without and with intravenous contrast. CONTRAST:  133mGADAVIST GADOBUTROL 1 MMOL/ML IV SOLN COMPARISON:  None Available. FINDINGS: Alignment: Straightening of the cervical spine. Vertebrae: No fracture, evidence of discitis, or bone lesion. Cord: Normal signal and morphology. Posterior Fossa, vertebral  arteries, paraspinal tissues: Negative. Disc levels: C2-3: No significant disc bulge. No neural foraminal stenosis. No central canal stenosis. C3-4: No significant disc bulge. No neural foraminal stenosis. No central canal stenosis. C4-5: No significant disc bulge. No neural foraminal stenosis. No central canal stenosis. C5-6: No significant disc bulge. No neural foraminal stenosis. No central canal stenosis. C6-7: No significant disc bulge. No neural foraminal stenosis. No central canal stenosis. C7-T1: No significant disc bulge. No neural foraminal stenosis. No central canal stenosis. IMPRESSION: 1. Normal morphology and signal of the visualized cord and posterior fossa structures. 2. No significant disc bulge or central canal/neural foraminal stenosis. Electronically Signed   By: ImKeane Police.O.   On: 10/31/2021 14:51   ? ? ? ?PROCEDURES: ? ?Critical Care performed: No ? ?Procedures ? ? ?MEDICATIONS ORDERED IN ED: ?Medications  ?diazepam (VALIUM) tablet 5 mg (5 mg Oral Given 10/31/21 1341)  ?gadobutrol (GADAVIST) 1  MMOL/ML injection 10 mL (10 mLs Intravenous Contrast Given 10/31/21 1436)  ? ? ? ?IMPRESSION / MDM / ASSESSMENT AND PLAN / ED COURSE  ?I reviewed the triage vital signs and the nursing notes. ?             ?               ? ?Differential diagnosis includes, but is not limited to, paresthesia, complex migraine, demyelinating process, stroke, conversion, peripheral neuropathy etc. ? ?Patient headache resolved 2 weeks ago ?Paresthesias left scalp and also feels a slight sense of right hand weakness though not discernible on my exam.  He does follow with neurology and has been evaluated for complex neurologic etiologies in the past ? ?The patient is on the cardiac monitor to evaluate for evidence of arrhythmia and/or significant heart rate changes. ? ?Clinical Course as of 10/31/21 1508  ?Tue Oct 31, 2021  ?1327 Discussed with Dr. Leonel Ramsay, he advises based on symptoms and previous history of the  patient MRI brain with and without contrast as well as of the cervical spine is needed at this time.  I have ordered [MQ]  ?[place] interpreted by me as normal CBC and metabolic panel.  CT of the head int

## 2021-10-31 NOTE — ED Notes (Signed)
See triage note  presents with h/a and some facial numbness   states pain is at top of head  having some diff with her speech ?

## 2021-10-31 NOTE — ED Notes (Signed)
See triage note. EDP Quale at bedside with this RN. Two weeks ago R eye twichting started; Thursday numbness at L head; Friday R sided head pain; pt now reporting numbness at R lip and weakness in R hand. Pt's speech clear. Pt had hysterectomy recently.  ?

## 2021-11-24 ENCOUNTER — Telehealth: Payer: Self-pay | Admitting: Adult Health

## 2021-11-24 NOTE — Telephone Encounter (Signed)
Pt called stating that she had put in a refill request for her atorvastatin (LIPITOR) 40 MG tablet and it has not been approved. Pt is now out of medication and is needing a refill request sent to the [facility] in [place].

## 2021-11-27 ENCOUNTER — Encounter: Payer: Self-pay | Admitting: Family Medicine

## 2021-11-27 MED ORDER — ATORVASTATIN CALCIUM 40 MG PO TABS: 40.0000 mg | ORAL_TABLET | Freq: Every day | ORAL | 0 refills | Status: DC

## 2021-11-27 NOTE — Telephone Encounter (Signed)
My chart message sent to the pt on this telephone call.

## 2021-11-30 ENCOUNTER — Other Ambulatory Visit: Payer: Self-pay | Admitting: Internal Medicine

## 2021-11-30 NOTE — Telephone Encounter (Signed)
Requested Prescriptions  Pending Prescriptions Disp Refills  . fluticasone (FLONASE) 50 MCG/ACT nasal spray [Pharmacy Med Name: FLUTICASONE 50MCG NASAL SP (120) RX] 16 g 2    Sig: USE 2 SPRAYS IN BOTH NOSTRILS DAILY. USE FOR 4 TO 6 WEEKS, THEN STOP AND USE SEASONALLY OR AS NEEDED     Ear, Nose, and Throat: Nasal Preparations - Corticosteroids Passed - 11/30/2021 10:42 AM      Passed - Valid encounter within last 12 months    Recent Outpatient Visits          1 month ago Abnormal urine odor   [facility] [place] [facility] [place], [place] W, NP   3 months ago Morbid obesity with BMI of 50.0-59.9, adult [facility])   [facility] O[place] Hauser, DO   4 months ago Acute non-recurrent maxillary sinusitis   [facility] [place], Coralie Keens, NP   9 months ago Hypothyroidism, unspecified type   [facility] Olin Hauser, DO   1 year ago Patient left without being seen   [facility] [place], Devonne Doughty, DO      Future Appointments            In 3 weeks [place], Devonne Doughty, [facility], [place]   In 1 month Ralene Bathe, MD [place]

## 2021-12-01 ENCOUNTER — Other Ambulatory Visit: Payer: Self-pay | Admitting: Adult Health

## 2021-12-01 DIAGNOSIS — I6930 Unspecified sequelae of cerebral infarction: Secondary | ICD-10-CM

## 2021-12-03 ENCOUNTER — Other Ambulatory Visit: Payer: Self-pay | Admitting: Cardiovascular Disease

## 2021-12-04 NOTE — Telephone Encounter (Signed)
LMOVM to verify medication.

## 2021-12-19 ENCOUNTER — Other Ambulatory Visit: Payer: Self-pay | Admitting: Family Medicine

## 2021-12-19 DIAGNOSIS — E039 Hypothyroidism, unspecified: Secondary | ICD-10-CM

## 2021-12-20 NOTE — Telephone Encounter (Signed)
Requested Prescriptions  Pending Prescriptions Disp Refills  . levothyroxine (SYNTHROID) 25 MCG tablet [Pharmacy Med Name: Levothyroxine Sodium 25 MCG Oral Tablet] 90 tablet 3    Sig: TAKE 1 TABLET BY MOUTH  DAILY BEFORE BREAKFAST     Endocrinology:  Hypothyroid Agents Passed - 12/19/2021  9:11 AM      Passed - TSH in normal range and within 360 days    TSH  Date Value Ref Range Status  02/17/2021 2.19 mIU/L Final    Comment:              Reference Range .           > or = 20 Years  0.40-4.50 .                Pregnancy Ranges           First trimester    0.26-2.66           Second trimester   0.55-2.73           Third trimester    0.43-2.91          Passed - Valid encounter within last 12 months    Recent Outpatient Visits          2 months ago Abnormal urine odor   [facility] [place], [place] W, NP   4 months ago Morbid obesity with BMI of 50.0-59.9, adult [facility])   [facility] Olin Hauser, DO   5 months ago Acute non-recurrent maxillary sinusitis   [facility] [place], Coralie Keens, NP   10 months ago Hypothyroidism, unspecified type   [facility] Olin Hauser, DO   1 year ago Patient left without being seen   [facility], Devonne Doughty, DO      Future Appointments            In 5 days [place], Devonne Doughty, [facility], [place]   In 4 weeks Ralene Bathe, MD [place]

## 2021-12-25 ENCOUNTER — Encounter: Payer: Self-pay | Admitting: Family Medicine

## 2021-12-25 ENCOUNTER — Ambulatory Visit: Payer: Managed Care, Other (non HMO) | Admitting: Family Medicine

## 2021-12-25 ENCOUNTER — Other Ambulatory Visit: Payer: Self-pay | Admitting: Family Medicine

## 2021-12-25 VITALS — BP 127/95 | HR 77 | Ht 61.0 in | Wt 285.8 lb

## 2021-12-25 DIAGNOSIS — E039 Hypothyroidism, unspecified: Secondary | ICD-10-CM | POA: Diagnosis not present

## 2021-12-25 DIAGNOSIS — Z6841 Body Mass Index (BMI) 40.0 and over, adult: Secondary | ICD-10-CM

## 2021-12-25 DIAGNOSIS — R7309 Other abnormal glucose: Secondary | ICD-10-CM

## 2021-12-25 DIAGNOSIS — I6930 Unspecified sequelae of cerebral infarction: Secondary | ICD-10-CM

## 2021-12-25 DIAGNOSIS — J3089 Other allergic rhinitis: Secondary | ICD-10-CM

## 2021-12-25 DIAGNOSIS — F314 Bipolar disorder, current episode depressed, severe, without psychotic features: Secondary | ICD-10-CM

## 2021-12-25 DIAGNOSIS — Z0000 Encounter for general adult medical examination without abnormal findings: Secondary | ICD-10-CM

## 2021-12-25 MED ORDER — ATORVASTATIN CALCIUM 40 MG PO TABS: 40.0000 mg | ORAL_TABLET | Freq: Every day | ORAL | 3 refills | Status: DC

## 2021-12-25 MED ORDER — AZELASTINE HCL 0.1 % NA SOLN: 2.0000 | Freq: Two times a day (BID) | NASAL | 3 refills | Status: DC

## 2021-12-25 MED ORDER — WEGOVY 0.5 MG/0.5ML ~~LOC~~ SOAJ: 0.5000 mg | SUBCUTANEOUS | 1 refills | Status: DC

## 2021-12-25 MED ORDER — BUPROPION HCL ER (XL) 150 MG PO TB24: 150.0000 mg | ORAL_TABLET | Freq: Every day | ORAL | 0 refills | Status: DC

## 2021-12-25 NOTE — Progress Notes (Signed)
Subjective:    Patient ID: Leah ARMEL, female    DOB: 01-04-1986, 36 y.o.   MRN: [id number]  Leah Osborne is a 36 y.o. female presenting on 12/25/2021 for Obesity   HPI  Morbid Obesity, BMI >54 Previously on Wegovy 0.25 trial, ineffective and high cost tier 3, did not pursue after 1 month, no higher dose. Now met deductible interested to re try. Failed Wellbutrin XL from 150 up to 359m daily, no impact on her weight / appetite  Hyperlipidemia Hx CVA Needs refill Atorvastatin medication.  Allergic Rhinosinusitis Has used Flonase in past, Claritin Asking about other therapy options      12/25/2021    8:56 AM 08/22/2021   11:03 AM 08/18/2021    2:25 PM  Depression screen PHQ 2/9  Decreased Interest 1 0 0  Down, Depressed, Hopeless 0 0 0  PHQ - 2 Score 1 0 0  Altered sleeping 0  0  Tired, decreased energy 2  3  Change in appetite 1  1  Feeling bad or failure about yourself  0  0  Trouble concentrating 1  1  Moving slowly or fidgety/restless 0  0  Suicidal thoughts 0  0  PHQ-9 Score 5  5  Difficult doing work/chores Somewhat difficult  Not difficult at all      12/25/2021    8:56 AM 08/18/2021    2:25 PM 05/25/2021    4:09 PM 10/21/2018    9:31 AM  GAD 7 : Generalized Anxiety Score  Nervous, Anxious, on Edge _0 Control/stop worrying _1 Worry too much - different things _2 Trouble relaxing _3 Restless _4 Easily annoyed or irritable _5 Afraid - awful might happen _6 Total GAD 7 Score _7 Anxiety Difficulty Not difficult at all Not difficult at all  Extremely difficult      Social History   Tobacco Use   Smoking status: Never   Smokeless tobacco: Never  Vaping Use   Vaping Use: Never used  Substance Use Topics   Alcohol use: Not Currently    Alcohol/week: 0.0 standard drinks of alcohol   Drug use: No    Review of Systems Per HPI unless specifically indicated above     Objective:    BP (!)  127/95   Pulse 77   Ht 5' 1" (1.549 m)   Wt 285 lb 12.8 oz (129.6 kg)   SpO2 98%   BMI 54.00 kg/m   Wt Readings from Last 3 Encounters:  12/25/21 285 lb 12.8 oz (129.6 kg)  10/31/21 278 lb (126.1 kg)  10/11/21 278 lb 1.6 oz (126.1 kg)    Physical Exam Vitals and nursing note reviewed.  Constitutional:      General: She is not in acute distress.    Appearance: She is well-developed. She is obese. She is not diaphoretic.     Comments: Well-appearing, comfortable, cooperative  HENT:     Head: Normocephalic and atraumatic.  Eyes:     General:        Right eye: No discharge.        Left eye: No discharge.     Conjunctiva/sclera: Conjunctivae normal.  Neck:     Thyroid: No thyromegaly.  Cardiovascular:     Rate and Rhythm: Normal rate and regular rhythm.     Heart  sounds: Normal heart sounds. No murmur heard. Pulmonary:     Effort: Pulmonary effort is normal. No respiratory distress.     Breath sounds: Normal breath sounds. No wheezing or rales.  Musculoskeletal:        General: Normal range of motion.     Cervical back: Normal range of motion and neck supple.  Lymphadenopathy:     Cervical: No cervical adenopathy.  Skin:    General: Skin is warm and dry.     Findings: No erythema or rash.  Neurological:     Mental Status: She is alert and oriented to person, place, and time.  Psychiatric:        Behavior: Behavior normal.     Comments: Well groomed, good eye contact, normal speech and thoughts      Results for orders placed or performed during the hospital encounter of 10/31/21  Comprehensive metabolic panel  Result Value Ref Range   Sodium 141 135 - 145 mmol/L   Potassium 4.0 3.5 - 5.1 mmol/L   Chloride 105 98 - 111 mmol/L   CO2 27 22 - 32 mmol/L   Glucose, Bld 95 70 - 99 mg/dL   BUN 18 6 - 20 mg/dL   Creatinine, Ser 0.98 0.44 - 1.00 mg/dL   Calcium 9.6 8.9 - 10.3 mg/dL   Total Protein 7.3 6.5 - 8.1 g/dL   Albumin 4.1 3.5 - 5.0 g/dL   AST 19 15 - 41 U/L    ALT 17 0 - 44 U/L   Alkaline Phosphatase 83 38 - 126 U/L   Total Bilirubin 0.5 0.3 - 1.2 mg/dL   GFR, Estimated >60 >60 mL/min   Anion gap 9 5 - 15  CBC with Differential  Result Value Ref Range   WBC 8.2 4.0 - 10.5 K/uL   RBC 4.95 3.87 - 5.11 MIL/uL   Hemoglobin 12.4 12.0 - 15.0 g/dL   HCT 40.1 36.0 - 46.0 %   MCV 81.0 80.0 - 100.0 fL   MCH 25.1 (L) 26.0 - 34.0 pg   MCHC 30.9 30.0 - 36.0 g/dL   RDW 14.8 11.5 - 15.5 %   Platelets 409 (H) 150 - 400 K/uL   nRBC 0.0 0.0 - 0.2 %   Neutrophils Relative % 58 %   Neutro Abs 4.7 1.7 - 7.7 K/uL   Lymphocytes Relative 31 %   Lymphs Abs 2.5 0.7 - 4.0 K/uL   Monocytes Relative 8 %   Monocytes Absolute 0.7 0.1 - 1.0 K/uL   Eosinophils Relative 2 %   Eosinophils Absolute 0.2 0.0 - 0.5 K/uL   Basophils Relative 1 %   Basophils Absolute 0.1 0.0 - 0.1 K/uL   Immature Granulocytes 0 %   Abs Immature Granulocytes 0.02 0.00 - 0.07 K/uL  Protime-INR  Result Value Ref Range   Prothrombin Time 13.4 11.4 - 15.2 seconds   INR 1.0 0.8 - 1.2  APTT  Result Value Ref Range   aPTT 31 24 - 36 seconds  CBG monitoring, ED  Result Value Ref Range   Glucose-Capillary 104 (H) 70 - 99 mg/dL  Troponin I (High Sensitivity)  Result Value Ref Range   Troponin I (High Sensitivity) <2 <18 ng/L      Assessment & Plan:   Problem List Items Addressed This Visit     Hypothyroidism   Relevant Orders   TSH   T4, free   T4, free   TSH   History of cerebrovascular accident (CVA) with residual deficit  Relevant Medications   atorvastatin (LIPITOR) 40 MG tablet   Other Visit Diagnoses     Morbid obesity with BMI of 50.0-59.9, adult ([place])    -  Primary   Relevant Medications   buPROPion (WELLBUTRIN XL) 150 MG 24 hr tablet   WEGOVY 0.5 MG/0.5ML SOAJ   Seasonal allergic rhinitis due to other allergic trigger       Relevant Medications   azelastine (ASTELIN) 0.1 % nasal spray      Obesity Weight management Failed Wellbutrin  Taper off the Wellbutrin  XL - down to the 16m daily for 2 weeks, then off OR you can extend it out by another 1-2 weeks with every other day dosing (optional).  New order higher dose Wegovy 0.565mweekly injection through [facility], stay tuned for approval. Would recommend dose increase monthly to 76m75mhen 1.7mg47mhen max 2.4 mg if tolerated and if covered.  Hypothyroidism Check labs today for thyroid panel. Last was 02/2021, normal.  Hyperlipidemia Hx Stroke Atorvastatin refilled 90 day to [facility].   Meds ordered this encounter  Medications   buPROPion (WELLBUTRIN XL) 150 MG 24 hr tablet    Sig: Take 1 tablet (150 mg total) by mouth daily. For 2 weeks then discontinue. May take one tab every other day for remaining 1-2 weeks if needed.    Dispense:  30 tablet    Refill:  0   WEGOVY 0.5 MG/0.5ML SOAJ    Sig: Inject 0.5 mg into the skin once a week.    Dispense:  2 mL    Refill:  1   atorvastatin (LIPITOR) 40 MG tablet    Sig: Take 1 tablet (40 mg total) by mouth daily.    Dispense:  90 tablet    Refill:  3   azelastine (ASTELIN) 0.1 % nasal spray    Sig: Place 2 sprays into both nostrils 2 (two) times daily. Use in each nostril as directed    Dispense:  90 mL    Refill:  3      Follow up plan: Return in about 2 months (around 02/25/2022) for 2+ months fasting lab then 1 week later Annual Physical (wt updates, biometric).  Future labs ordered for 01/2022   AlexNobie Putnam [facility] 12/25/2021, 8:52 AM

## 2021-12-25 NOTE — Patient Instructions (Addendum)
Thank you for coming to the office today.  Taper off the Wellbutrin XL - down to the '150mg'$  daily for 2 weeks, then off OR you can extend it out by another 1-2 weeks with every other day dosing (optional).  New order higher dose [facility] 0.'5mg'$  weekly injection through [facility], stay tuned for approval.  Would recommend dose increase monthly to '1mg'$  then 1.'7mg'$ , then max 2.4 mg if tolerated and if covered.  Check labs today for thyroid panel. Last was 02/2021, normal.  Atorvastatin refilled 90 day to [facility].  DUE for FASTING BLOOD WORK (no food or drink after midnight before the lab appointment, only water or coffee without cream/sugar on the morning of)  SCHEDULE "Lab Only" visit in the morning at the clinic for lab draw in 2-3 MONTHS   - Make sure Lab Only appointment is at about 1 week before your next appointment, so that results will be available  For Lab Results, once available within 2-3 days of blood draw, you can can log in to MyChart online to view your results and a brief explanation. Also, we can discuss results at next follow-up visit.   Please schedule a Follow-up Appointment to: Return in about 2 months (around 02/25/2022) for 2+ months fasting lab then 1 week later Annual Physical (wt updates, biometric).  If you have any other questions or concerns, please feel free to call the office or send a message through [place]. You may also schedule an earlier appointment if necessary.  Additionally, you may be receiving a survey about your experience at our office within a few days to 1 week by e-mail or mail. We value your feedback.  Nobie Putnam, DO [place]

## 2021-12-26 LAB — T4, FREE: Free T4: 1 ng/dL (ref 0.8–1.8)

## 2021-12-26 LAB — TSH: TSH: 2.61 mIU/L

## 2021-12-27 ENCOUNTER — Other Ambulatory Visit: Payer: Self-pay | Admitting: Adult Health

## 2021-12-31 ENCOUNTER — Encounter: Payer: Self-pay | Admitting: Family Medicine

## 2021-12-31 ENCOUNTER — Other Ambulatory Visit: Payer: Self-pay | Admitting: Obstetrics and Gynecology

## 2022-01-01 MED ORDER — WEGOVY 0.5 MG/0.5ML ~~LOC~~ SOAJ: 0.5000 mg | SUBCUTANEOUS | 1 refills | Status: DC

## 2022-01-12 ENCOUNTER — Telehealth: Payer: Self-pay

## 2022-01-12 NOTE — Telephone Encounter (Unsigned)
Copied from [place] [id number]. Topic: General - Call Back - No Documentation >> Jan 12, 2022  9:34 AM Sabas Sous wrote: Reason for CRM:   Best contact: [phone] Reference: [id number] Danicka calling from [facility] [employer] PA appeals department wants to discuss the patient's [facility]. She says that a form was faxed yesterday and also July 26th.

## 2022-01-18 ENCOUNTER — Ambulatory Visit: Payer: Managed Care, Other (non HMO) | Admitting: Dermatology

## 2022-01-18 ENCOUNTER — Encounter: Payer: Self-pay | Admitting: Family Medicine

## 2022-01-18 DIAGNOSIS — M545 Low back pain, unspecified: Secondary | ICD-10-CM

## 2022-01-18 DIAGNOSIS — L409 Psoriasis, unspecified: Secondary | ICD-10-CM | POA: Diagnosis not present

## 2022-01-18 DIAGNOSIS — D1739 Benign lipomatous neoplasm of skin and subcutaneous tissue of other sites: Secondary | ICD-10-CM | POA: Diagnosis not present

## 2022-01-18 DIAGNOSIS — M450 Ankylosing spondylitis of multiple sites in spine: Secondary | ICD-10-CM

## 2022-01-18 DIAGNOSIS — L719 Rosacea, unspecified: Secondary | ICD-10-CM

## 2022-01-18 DIAGNOSIS — D1730 Benign lipomatous neoplasm of skin and subcutaneous tissue of unspecified sites: Secondary | ICD-10-CM

## 2022-01-18 MED ORDER — KETOCONAZOLE 2 % EX SHAM: 1.0000 | MEDICATED_SHAMPOO | CUTANEOUS | 1 refills | Status: AC

## 2022-01-18 MED ORDER — IVERMECTIN 1 % EX CREA: 1.0000 | TOPICAL_CREAM | Freq: Every morning | CUTANEOUS | 6 refills | Status: DC

## 2022-01-18 MED ORDER — CLOBETASOL PROPIONATE 0.05 % EX SHAM: 1.0000 | MEDICATED_SHAMPOO | CUTANEOUS | 0 refills | Status: DC

## 2022-01-18 MED ORDER — MOMETASONE FUROATE 0.1 % EX CREA: 1.0000 | TOPICAL_CREAM | CUTANEOUS | 1 refills | Status: AC

## 2022-01-18 NOTE — Progress Notes (Signed)
New Patient Visit  Subjective  Leah Osborne is a 36 y.o. female who presents for the following: Psoriasis (Scalp, arms, legs, yrs, Itchy Lidex scalp solution 1x q 2 wks, otc shampoo for psoriasis), Rosacea (Face, yrs, not treatment), and check growth (L side, ~26yr, irritating).  New patient referral from Dr. ANobie Putnam  The following portions of the chart were reviewed this encounter and updated as appropriate:   Tobacco  Allergies  Meds  Problems  Med Hx  Surg Hx  Fam Hx     Review of Systems:  No other skin or systemic complaints except as noted in HPI or Assessment and Plan.  Objective  Well appearing patient in no apparent distress; mood and affect are within normal limits.  A focused examination was performed including face, scalp, arms, legs, left hip. Relevant physical exam findings are noted in the Assessment and Plan.  face Bright red erythema face, 2 paps face       Left Hip 1.5cm mamillated pap  Scalp,arms Minimal scale scalp, crusted plaques arms   Assessment & Plan  Rosacea face  Erythrotelangiectatic Rosacea   Rosacea is a chronic progressive skin condition usually affecting the face of adults, causing redness and/or acne bumps. It is treatable but not curable. It sometimes affects the eyes (ocular rosacea) as well. It may respond to topical and/or systemic medication and can flare with stress, sun exposure, alcohol, exercise and some foods.  Daily application of broad spectrum spf 30+ sunscreen to face is recommended to reduce flares.  Start SM Oxymetazoline 1% / Ivermectin 1% / Niacinamide 2% Cream qam to face Apply 1 Application topically every morning.  Discussed the treatment option of BBL/laser.  Typically we recommend 1-3 treatment sessions about 5-8 weeks apart for best results.  The patient's condition may require "maintenance treatments" in the future.  The fee for BBL / laser treatments is $350 per treatment session for  the whole face.  A fee can be quoted for other parts of the body. Insurance typically does not pay for BBL/laser treatments and therefore the fee is an out-of-pocket cost.  Fibrolipoma of skin Left Hip Recommend excising if symptomatic, pt will schedule surgery  Psoriasis Scalp,arms Patient with hx of rheumatoid arthritis and osteo arthritis [facility]  Psoriasis is a chronic non-curable, but treatable genetic/hereditary disease that may have other systemic features affecting other organ systems such as joints (Psoriatic Arthritis). It is associated with an increased risk of inflammatory bowel disease, heart disease, non-alcoholic fatty liver disease, and depression.     Start Ketoconazole 2% shampoo wash scalp qd to qod, let sit 10 minutes and rinse out Start Clobetasol shampoo 3x/wk, let sit 15 minutes on scalp, avoid face, groin, axilla Start Mometasone cr qd to aa on arms, legs qd up to 5d/wk, prn flares  Topical steroids (such as triamcinolone, fluocinolone, fluocinonide, mometasone, clobetasol, halobetasol, betamethasone, hydrocortisone) can cause thinning and lightening of the skin if they are used for too long in the same area. Your physician has selected the right strength medicine for your problem and area affected on the body. Please use your medication only as directed by your physician to prevent side effects.    Clobetasol Propionate 0.05 % shampoo - Scalp,arms Apply 1 Application topically 3 (three) times a week. Wash scalp 3 times weekly, apply to dry scalp let sit 15 minutes and rinse out ketoconazole (NIZORAL) 2 % shampoo - Scalp,arms Apply 1 Application topically as directed. Wash scalp qd  to qod, let sit 10 minutes and rinse out mometasone (ELOCON) 0.1 % cream - Scalp,arms Apply 1 Application topically as directed. Apply to aa psoriasis on body qd up to 5 days a week until clear, then prn flares  Return for surgery L hip fibrolipoma, will do  Rosacea/Psoriasis f/u at suture removal.  I, Othelia Pulling, RMA, am acting as scribe for Sarina Ser, MD . Documentation: I have reviewed the above documentation for accuracy and completeness, and I agree with the above.  Sarina Ser, MD

## 2022-01-18 NOTE — Patient Instructions (Addendum)
Pre-Operative Instructions  You are scheduled for a surgical procedure at [facility]. We recommend you read the following instructions. If you have any questions or concerns, please call the office at [phone].  Shower and wash the entire body with soap and water the day of your surgery paying special attention to cleansing at and around the planned surgery site.  Avoid aspirin or aspirin containing products at least fourteen (14) days prior to your surgical procedure and for at least one week (7 Days) after your surgical procedure. If you take aspirin on a regular basis for heart disease or history of stroke or for any other reason, we may recommend you continue taking aspirin but please notify us if you take this on a regular basis. Aspirin can cause more bleeding to occur during surgery as well as prolonged bleeding and bruising after surgery.   Avoid other nonsteroidal pain medications at least one week prior to surgery and at least one week prior to your surgery. These include medications such as Ibuprofen (Motrin, Advil and Nuprin), Naprosyn, Voltaren, Relafen, etc. If medications are used for therapeutic reasons, please inform us as they can cause increased bleeding or prolonged bleeding during and bruising after surgical procedures.   Please advise [place] if you are taking any "blood thinner" medications such as Coumadin or Dipyridamole or Plavix or similar medications. These cause increased bleeding and prolonged bleeding during procedures and bruising after surgical procedures. We may have to consider discontinuing these medications briefly prior to and shortly after your surgery if safe to do so.   Please inform us of all medications you are currently taking. All medications that are taken regularly should be taken the day of surgery as you always do. Nevertheless, we need to be informed of what medications you are taking prior to surgery to know whether they will affect the  procedure or cause any complications.   Please inform us of any medication allergies. Also inform us of whether you have allergies to Latex or rubber products or whether you have had any adverse reaction to Lidocaine or Epinephrine.  Please inform us of any prosthetic or artificial body parts such as artificial heart valve, joint replacements, etc., or similar condition that might require preoperative antibiotics.   We recommend avoidance of alcohol at least two weeks prior to surgery and continued avoidance for at least two weeks after surgery.   We recommend discontinuation of tobacco smoking at least two weeks prior to surgery and continued abstinence for at least two weeks after surgery.  Do not plan strenuous exercise, strenuous work or strenuous lifting for approximately four weeks after your surgery.   We request if you are unable to make your scheduled surgical appointment, please call us at least a week in advance or as soon as you are aware of a problem so that we can cancel or reschedule the appointment.   You MAY TAKE TYLENOL (acetaminophen) for pain as it is not a blood thinner.   PLEASE PLAN TO BE IN TOWN FOR TWO WEEKS FOLLOWING SURGERY, THIS IS IMPORTANT SO YOU CAN BE CHECKED FOR DRESSING CHANGES, SUTURE REMOVAL AND TO MONITOR FOR POSSIBLE COMPLICATIONS.    Instructions for [facility] Medications  One or more of your medications was sent to the [facility] mail order compounding pharmacy. You will receive an email from them and can purchase the medicine through that link. It will then be mailed to your home at the address you confirmed. If for any  reason you do not receive an email from them, please check your spam folder. If you still do not find the email, please let us know. [facility] phone number is [phone].    Due to recent changes in healthcare laws, you may see results of your pathology and/or laboratory studies on MyChart before the doctors have had  a chance to review them. We understand that in some cases there may be results that are confusing or concerning to you. Please understand that not all results are received at the same time and often the doctors may need to interpret multiple results in order to provide you with the best plan of care or course of treatment. Therefore, we ask that you please give [place] 2 business days to thoroughly review all your results before contacting the office for clarification. Should we see a critical lab result, you will be contacted sooner.   If You Need Anything After Your Visit  If you have any questions or concerns for your doctor, please call our main line at [phone] and press option 4 to reach your doctor's medical assistant. If no one answers, please leave a voicemail as directed and we will return your call as soon as possible. Messages left after 4 pm will be answered the following business day.   You may also send [place] a message via [place]. We typically respond to MyChart messages within 1-2 business days.  For prescription refills, please ask your pharmacy to contact our office. Our fax number is [phone].  If you have an urgent issue when the clinic is closed that cannot wait until the next business day, you can page your doctor at the number below.    Please note that while we do our best to be available for urgent issues outside of office hours, we are not available 24/7.   If you have an urgent issue and are unable to reach [place], you may choose to seek medical care at your doctor's office, retail clinic, urgent care center, or emergency room.  If you have a medical emergency, please immediately call 911 or go to the emergency department.  Pager Numbers  - Dr. Nehemiah Massed: [phone]  - Dr. Laurence Ferrari: [phone]  - Dr. Nicole Kindred: [phone]  In the event of inclement weather, please call our main line at [phone] for an update on the status of any delays or closures.  Dermatology  Medication Tips: Please keep the boxes that topical medications come in in order to help keep track of the instructions about where and how to use these. Pharmacies typically print the medication instructions only on the boxes and not directly on the medication tubes.   If your medication is too expensive, please contact our office at [phone] option 4 or send [place] a message through [place].   We are unable to tell what your co-pay for medications will be in advance as this is different depending on your insurance coverage. However, we may be able to find a substitute medication at lower cost or fill out paperwork to get insurance to cover a needed medication.   If a prior authorization is required to get your medication covered by your insurance company, please allow [place] 1-2 business days to complete this process.  Drug prices often vary depending on where the prescription is filled and some pharmacies may offer cheaper prices.  The website www.goodrx.com contains coupons for medications through different pharmacies. The prices here do not account for what the cost may be with help  from insurance (it may be cheaper with your insurance), but the website can give you the price if you did not use any insurance.  - You can print the associated coupon and take it with your prescription to the pharmacy.  - You may also stop by our office during regular business hours and pick up a GoodRx coupon card.  - If you need your prescription sent electronically to a different pharmacy, notify our office through [facility] or by phone at [phone] option 4.     Si Usted Necesita Algo Despus de Su Visita  Tambin puede enviarnos un mensaje a travs de Pharmacist, community. Por lo general respondemos a los mensajes de MyChart en el transcurso de 1 a 2 das hbiles.  Para renovar recetas, por favor pida a su farmacia que se ponga en contacto con nuestra oficina. Harland Dingwall de fax es [place] [phone].  Si  tiene un asunto urgente cuando la clnica est cerrada y que no puede esperar hasta el siguiente da hbil, puede llamar/localizar a su doctor(a) al nmero que aparece a continuacin.   Por favor, tenga en cuenta que aunque hacemos todo lo posible para estar disponibles para asuntos urgentes fuera del horario de [place], no estamos disponibles las 24 horas del da, los 7 das de la [place].   Si tiene un problema urgente y no puede comunicarse con nosotros, puede optar por buscar atencin mdica  en el consultorio de su doctor(a), en una clnica privada, en un centro de atencin urgente o en una sala de emergencias.  Si tiene Engineering geologist, por favor llame inmediatamente al 911 o vaya a la sala de emergencias.  Nmeros de bper  - Dr. Nehemiah Massed: [phone]  - Dra. Moye: [phone]  - Dra. Nicole Kindred: [phone]  En caso de inclemencias del [place], por favor llame a Johnsie Kindred principal al [phone] para una actualizacin sobre el [place] de cualquier retraso o cierre.  Consejos para la medicacin en dermatologa: Por favor, guarde las cajas en las que vienen los medicamentos de uso tpico para ayudarle a seguir las instrucciones sobre dnde y cmo usarlos. Las farmacias generalmente imprimen las instrucciones del medicamento slo en las cajas y no directamente en los tubos del [place].   Si su medicamento es muy caro, por favor, pngase en contacto con Zigmund Daniel llamando al [phone] y presione la opcin 4 o envenos un mensaje a travs de Pharmacist, community.   No podemos decirle cul ser su copago por los medicamentos por adelantado ya que esto es diferente dependiendo de la cobertura de su seguro. Sin embargo, es posible que podamos encontrar un medicamento sustituto a Electrical engineer un formulario para que el seguro cubra el medicamento que se considera necesario.   Si se requiere una autorizacin previa para que su compaa de seguros [place] su medicamento, por favor  permtanos de 1 a 2 das hbiles para completar este proceso.  Los precios de los medicamentos varan con frecuencia dependiendo del Environmental consultant de dnde se surte la receta y alguna farmacias pueden ofrecer precios ms baratos.  El sitio web www.goodrx.com tiene cupones para medicamentos de Airline pilot. Los precios aqu no tienen en cuenta lo que podra costar con la ayuda del seguro (puede ser ms barato con su seguro), pero el sitio web puede darle el precio si no utiliz Research scientist (physical sciences).  - Puede imprimir el cupn correspondiente y llevarlo con su receta a la farmacia.  - Tambin puede pasar por nuestra oficina durante el horario  de atencin regular y Charity fundraiser una tarjeta de cupones de GoodRx.  - Si necesita que su receta se enve electrnicamente a una farmacia diferente, informe a nuestra oficina a travs de MyChart de  o por telfono llamando al [phone] y presione la opcin 4.

## 2022-01-21 ENCOUNTER — Encounter: Payer: Self-pay | Admitting: Dermatology

## 2022-01-23 ENCOUNTER — Other Ambulatory Visit: Payer: Self-pay | Admitting: Dermatology

## 2022-01-23 ENCOUNTER — Other Ambulatory Visit: Payer: Self-pay | Admitting: Family Medicine

## 2022-01-23 DIAGNOSIS — L409 Psoriasis, unspecified: Secondary | ICD-10-CM

## 2022-01-23 DIAGNOSIS — E039 Hypothyroidism, unspecified: Secondary | ICD-10-CM

## 2022-01-23 NOTE — Addendum Note (Signed)
Addended by: Olin Hauser on: 01/23/2022 05:01 PM   Modules accepted: Orders

## 2022-01-23 NOTE — Telephone Encounter (Signed)
Requested medication (s) are due for refill today: No  Requested medication (s) are on the active medication list: Yes  Last refill:  12/30/21  Future visit scheduled: yes  Notes to clinic:  Pharmacy requests 1 year supply.    Requested Prescriptions  Pending Prescriptions Disp Refills   levothyroxine (SYNTHROID) 25 MCG tablet [Pharmacy Med Name: Levothyroxine Sodium 25 MCG Oral Tablet] 90 tablet 3    Sig: TAKE 1 TABLET BY MOUTH  DAILY BEFORE BREAKFAST     Endocrinology:  Hypothyroid Agents Passed - 01/23/2022  7:15 AM      Passed - TSH in normal range and within 360 days    TSH  Date Value Ref Range Status  12/25/2021 2.61 mIU/L Final    Comment:              Reference Range .           > or = 20 Years  0.40-4.50 .                Pregnancy Ranges           First trimester    0.26-2.66           Second trimester   0.55-2.73           Third trimester    0.43-2.91          Passed - Valid encounter within last 12 months    Recent Outpatient Visits           4 weeks ago Morbid obesity with BMI of 50.0-59.9, adult [facility])   [place], DO   3 months ago Abnormal urine odor   [facility] [place], [place] W, NP   5 months ago Morbid obesity with BMI of 50.0-59.9, adult [facility])   [facility] Olin Hauser, DO   6 months ago Acute non-recurrent maxillary sinusitis   [facility] [place], Coralie Keens, NP   11 months ago Hypothyroidism, unspecified type   [facility] [place], Devonne Doughty, DO       Future Appointments             In 1 month [place], Devonne Doughty, DO [facility], [facility]

## 2022-01-24 LAB — BASIC METABOLIC PANEL
Creatinine: 1 (ref 0.5–1.1)
Glucose: 86

## 2022-01-24 LAB — LIPID PANEL
Cholesterol: 110 (ref 0–200)
HDL: 33 — AB (ref 35–70)
LDL Cholesterol: 61
Triglycerides: 79 (ref 40–160)

## 2022-01-24 LAB — HEMOGLOBIN A1C: Hemoglobin A1C: 6.2

## 2022-01-27 ENCOUNTER — Other Ambulatory Visit: Payer: Self-pay | Admitting: Family Medicine

## 2022-01-29 NOTE — Telephone Encounter (Signed)
Requested Prescriptions  Pending Prescriptions Disp Refills  . buPROPion (WELLBUTRIN XL) 150 MG 24 hr tablet [Pharmacy Med Name: BUPROPION XL '150MG'$  TABLETS (24 H)] 30 tablet 0    Sig: TAKE 1 TABLET(150 MG) BY MOUTH DAILY FOR 2 WEEKS THEN STOP. MAY TAKE 1 TABLET EVERY OTHER DAY FOR REMAINING 1-2 WEEKS AS NEEDED     Psychiatry: Antidepressants - bupropion Failed - 01/27/2022  3:24 AM      Failed - Last BP in normal range    BP Readings from Last 1 Encounters:  12/25/21 (!) 127/95         Passed - Cr in normal range and within 360 days    Creat  Date Value Ref Range Status  01/28/2019 0.88 0.50 - 1.10 mg/dL Final   Creatinine, Ser  Date Value Ref Range Status  10/31/2021 0.98 0.44 - 1.00 mg/dL Final   Creatinine, Urine  Date Value Ref Range Status  04/08/2015 92 mg/dL Final         Passed - AST in normal range and within 360 days    AST  Date Value Ref Range Status  10/31/2021 19 15 - 41 U/L Final   SGOT(AST)  Date Value Ref Range Status  07/19/2011 19 15 - 37 Unit/L Final         Passed - ALT in normal range and within 360 days    ALT  Date Value Ref Range Status  10/31/2021 17 0 - 44 U/L Final   SGPT (ALT)  Date Value Ref Range Status  07/19/2011 29 U/L Final    Comment:    12-78 NOTE: NEW REFERENCE RANGE 05/11/2011          Passed - Completed PHQ-2 or PHQ-9 in the last 360 days      Passed - Valid encounter within last 6 months    Recent Outpatient Visits          1 month ago Morbid obesity with BMI of 50.0-59.9, adult [facility])   [place], DO   3 months ago Abnormal urine odor   [facility] [place], [place], NP   5 months ago Morbid obesity with BMI of 50.0-59.9, adult [facility])   [facility] Olin Hauser, DO   6 months ago Acute non-recurrent maxillary sinusitis   [facility] [place], Coralie Keens, NP   11 months ago Hypothyroidism, unspecified type   [place], Devonne Doughty, DO      Future Appointments            In 1 month [place], Devonne Doughty, DO [facility], [facility]

## 2022-01-30 ENCOUNTER — Ambulatory Visit: Payer: Managed Care, Other (non HMO) | Admitting: Dermatology

## 2022-01-30 DIAGNOSIS — D492 Neoplasm of unspecified behavior of bone, soft tissue, and skin: Secondary | ICD-10-CM

## 2022-01-30 DIAGNOSIS — D485 Neoplasm of uncertain behavior of skin: Secondary | ICD-10-CM

## 2022-01-30 MED ORDER — MUPIROCIN 2 % EX OINT: 1.0000 | TOPICAL_OINTMENT | Freq: Every day | CUTANEOUS | 1 refills | Status: DC

## 2022-01-30 NOTE — Progress Notes (Signed)
   Follow-Up Visit   Subjective  Leah Osborne is a 36 y.o. 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  The following portions of the chart were reviewed this encounter and updated as appropriate:   Tobacco  Allergies  Meds  Problems  Med Hx  Surg Hx  Fam Hx     Review of Systems:  No other skin or systemic complaints except as noted in HPI or Assessment and Plan.  Objective  Well appearing patient in no apparent distress; mood and affect are within normal limits.  A focused examination was performed including left hip. Relevant physical exam findings are noted in the Assessment and Plan.  Left Hip 3.2 x 1.5cm mamillated pap   Assessment & Plan  Neoplasm of skin Left Hip  Skin excision  Lesion length (cm):  3.2 Lesion width (cm):  1.5 Margin per side (cm):  0 Total excision diameter (cm):  3.2 Informed consent: discussed and consent obtained   Timeout: patient name, date of birth, surgical site, and procedure verified   Procedure prep:  Patient was prepped and draped in usual sterile fashion Prep type:  Isopropyl alcohol and povidone-iodine Anesthesia: the lesion was anesthetized in a standard fashion   Anesthetic:  1% lidocaine w/ epinephrine 1-100,000 buffered w/ 8.4% NaHCO3 (6cc lido w/ epi, 3cc bupivicaine, Total of 9cc) Instrument used: #15 blade   Hemostasis achieved with: pressure   Hemostasis achieved with comment:  Electrocautery Outcome: patient tolerated procedure well with no complications   Post-procedure details: sterile dressing applied and wound care instructions given   Dressing type: bandage, pressure dressing and bacitracin (Mupirocin)    Skin repair Complexity:  Complex Final length (cm):  4.5 Reason for type of repair: reduce tension to allow closure, reduce the risk of dehiscence, infection, and necrosis, reduce subcutaneous dead space and avoid a hematoma, allow closure of the  large defect, preserve normal anatomy, preserve normal anatomical and functional relationships and enhance both functionality and cosmetic results   Undermining: area extensively undermined   Undermining comment:  Undermining Defect 3.2cm Subcutaneous layers (deep stitches):  Suture size:  4-0 Suture type: Vicryl (polyglactin 910)   Subcutaneous suture technique: Inverted Dermal. Fine/surface layer approximation (top stitches):  Suture size:  4-0 Suture type: nylon   Stitches: simple running   Suture removal (days):  7 Hemostasis achieved with: pressure Outcome: patient tolerated procedure well with no complications   Post-procedure details: sterile dressing applied and wound care instructions given   Dressing type: bandage, pressure dressing and bacitracin (Mupirocin)    mupirocin ointment (BACTROBAN) 2 % Apply 1 Application topically daily. 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  I, Othelia Pulling, RMA, am acting as scribe for Sarina Ser, MD . Documentation: I have reviewed the above documentation for accuracy and completeness, and I agree with the above.  Sarina Ser, MD

## 2022-01-30 NOTE — Patient Instructions (Signed)

## 2022-01-31 ENCOUNTER — Telehealth: Payer: Self-pay

## 2022-01-31 NOTE — Telephone Encounter (Signed)
Patient doing fine after yesterdays surgery./sh 

## 2022-02-02 LAB — ANATOMIC PATHOLOGY REPORT

## 2022-02-06 ENCOUNTER — Ambulatory Visit (INDEPENDENT_AMBULATORY_CARE_PROVIDER_SITE_OTHER): Payer: Managed Care, Other (non HMO) | Admitting: Dermatology

## 2022-02-06 ENCOUNTER — Encounter: Payer: Self-pay | Admitting: Dermatology

## 2022-02-06 DIAGNOSIS — L719 Rosacea, unspecified: Secondary | ICD-10-CM

## 2022-02-06 DIAGNOSIS — D1739 Benign lipomatous neoplasm of skin and subcutaneous tissue of other sites: Secondary | ICD-10-CM

## 2022-02-06 DIAGNOSIS — D1730 Benign lipomatous neoplasm of skin and subcutaneous tissue of unspecified sites: Secondary | ICD-10-CM

## 2022-02-06 NOTE — Patient Instructions (Signed)
Due to recent changes in healthcare laws, you may see results of your pathology and/or laboratory studies on MyChart before the doctors have had a chance to review them. We understand that in some cases there may be results that are confusing or concerning to you. Please understand that not all results are received at the same time and often the doctors may need to interpret multiple results in order to provide you with the best plan of care or course of treatment. Therefore, we ask that you please give us 2 business days to thoroughly review all your results before contacting the office for clarification. Should we see a critical lab result, you will be contacted sooner.   If You Need Anything After Your Visit  If you have any questions or concerns for your doctor, please call our main line at [phone] and press option 4 to reach your doctor's medical assistant. If no one answers, please leave a voicemail as directed and we will return your call as soon as possible. Messages left after 4 pm will be answered the following business day.   You may also send us a message via MyChart. We typically respond to MyChart messages within 1-2 business days.  For prescription refills, please ask your pharmacy to contact our office. Our fax number is [phone].  If you have an urgent issue when the clinic is closed that cannot wait until the next business day, you can page your doctor at the number below.    Please note that while we do our best to be available for urgent issues outside of office hours, we are not available 24/7.   If you have an urgent issue and are unable to reach us, you may choose to seek medical care at your doctor's office, retail clinic, urgent care center, or emergency room.  If you have a medical emergency, please immediately call 911 or go to the emergency department.  Pager Numbers  - Dr. Kowalski: [phone]  - Dr. Moye: [phone]  - Dr. Stewart:  [phone]  In the event of inclement weather, please call our main line at [phone] for an update on the status of any delays or closures.  Dermatology Medication Tips: Please keep the boxes that topical medications come in in order to help keep track of the instructions about where and how to use these. Pharmacies typically print the medication instructions only on the boxes and not directly on the medication tubes.   If your medication is too expensive, please contact our office at [phone] option 4 or send us a message through MyChart.   We are unable to tell what your co-pay for medications will be in advance as this is different depending on your insurance coverage. However, we may be able to find a substitute medication at lower cost or fill out paperwork to get insurance to cover a needed medication.   If a prior authorization is required to get your medication covered by your insurance company, please allow us 1-2 business days to complete this process.  Drug prices often vary depending on where the prescription is filled and some pharmacies may offer cheaper prices.  The website www.goodrx.com contains coupons for medications through different pharmacies. The prices here do not account for what the cost may be with help from insurance (it may be cheaper with your insurance), but the website can give you the price if you did not use any insurance.  - You can print the associated coupon and take it with   your prescription to the pharmacy.  - You may also stop by our office during regular business hours and pick up a GoodRx coupon card.  - If you need your prescription sent electronically to a different pharmacy, notify our office through [facility] MyChart or by phone at [phone] option 4.     Si Usted Necesita Algo Despus de Su Visita  Tambin puede enviarnos un mensaje a travs de MyChart. Por lo general respondemos a los mensajes de MyChart en el transcurso de 1 a 2  das hbiles.  Para renovar recetas, por favor pida a su farmacia que se ponga en contacto con nuestra oficina. Nuestro nmero de fax es el [phone].  Si tiene un asunto urgente cuando la clnica est cerrada y que no puede esperar hasta el siguiente da hbil, puede llamar/localizar a su doctor(a) al nmero que aparece a continuacin.   Por favor, tenga en cuenta que aunque hacemos todo lo posible para estar disponibles para asuntos urgentes fuera del horario de oficina, no estamos disponibles las 24 horas del da, los 7 das de la semana.   Si tiene un problema urgente y no puede comunicarse con nosotros, puede optar por buscar atencin mdica  en el consultorio de su doctor(a), en una clnica privada, en un centro de atencin urgente o en una sala de emergencias.  Si tiene una emergencia mdica, por favor llame inmediatamente al 911 o vaya a la sala de emergencias.  Nmeros de bper  - Dr. Kowalski: [phone]  - Dra. Moye: [phone]  - Dra. Stewart: [phone]  En caso de inclemencias del tiempo, por favor llame a nuestra lnea principal al [phone] para una actualizacin sobre el estado de cualquier retraso o cierre.  Consejos para la medicacin en dermatologa: Por favor, guarde las cajas en las que vienen los medicamentos de uso tpico para ayudarle a seguir las instrucciones sobre dnde y cmo usarlos. Las farmacias generalmente imprimen las instrucciones del medicamento slo en las cajas y no directamente en los tubos del medicamento.   Si su medicamento es muy caro, por favor, pngase en contacto con nuestra oficina llamando al [phone] y presione la opcin 4 o envenos un mensaje a travs de MyChart.   No podemos decirle cul ser su copago por los medicamentos por adelantado ya que esto es diferente dependiendo de la cobertura de su seguro. Sin embargo, es posible que podamos encontrar un medicamento sustituto a menor costo o llenar un formulario para que el  seguro cubra el medicamento que se considera necesario.   Si se requiere una autorizacin previa para que su compaa de seguros cubra su medicamento, por favor permtanos de 1 a 2 das hbiles para completar este proceso.  Los precios de los medicamentos varan con frecuencia dependiendo del lugar de dnde se surte la receta y alguna farmacias pueden ofrecer precios ms baratos.  El sitio web www.goodrx.com tiene cupones para medicamentos de diferentes farmacias. Los precios aqu no tienen en cuenta lo que podra costar con la ayuda del seguro (puede ser ms barato con su seguro), pero el sitio web puede darle el precio si no utiliz ningn seguro.  - Puede imprimir el cupn correspondiente y llevarlo con su receta a la farmacia.  - Tambin puede pasar por nuestra oficina durante el horario de atencin regular y recoger una tarjeta de cupones de GoodRx.  - Si necesita que su receta se enve electrnicamente a una farmacia diferente, informe a nuestra oficina a travs de MyChart de [place]   o por telfono llamando al [phone] y presione la opcin 4.  

## 2022-02-06 NOTE — Progress Notes (Signed)
   Follow-Up Visit   Subjective  Leah Osborne is a 36 y.o. 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 Patient is here today for suture removal).  The following portions of the chart were reviewed this encounter and updated as appropriate:   Tobacco  Allergies  Meds  Problems  Med Hx  Surg Hx  Fam Hx     Review of Systems:  No other skin or systemic complaints except as noted in HPI or Assessment and Plan.  Objective  Well appearing patient in no apparent distress; mood and affect are within normal limits.  A focused examination was performed including the trunk and face. Relevant physical exam findings are noted in the Assessment and Plan.  Face Erythema  Left Hip Healing excision site.   Assessment & Plan  Rosacea Face  Rosacea is a chronic progressive skin condition usually affecting the face of adults, causing redness and/or acne bumps. It is treatable but not curable. It sometimes affects the eyes (ocular rosacea) as well. It may respond to topical and/or systemic medication and can flare with stress, sun exposure, alcohol, exercise and some foods.  Daily application of broad spectrum spf 30+ sunscreen to face is recommended to reduce flares.  Related Medications Ivermectin 1 % CREA Apply 1 Application topically every morning.  Dermatolipoma Left Hip  Encounter for Removal of Sutures - Incision site at the L hip is clean, dry and intact - Wound cleansed, sutures removed, wound cleansed and steri strips applied.  - Discussed pathology results showing a benign fibrolipoma.  - Patient advised to keep steri-strips dry until they fall off. - Scars remodel for a full year. - Once steri-strips fall off, patient can apply over-the-counter silicone scar cream each night to help with scar remodeling if desired. - Patient advised to call with any concerns or if they notice any new or changing  lesions.    Return in about 3 months (around 05/09/2022) for psoriasis and rosacea follow up .  Luther Redo, CMA, am acting as scribe for Sarina Ser, MD . Documentation: I have reviewed the above documentation for accuracy and completeness, and I agree with the above.  Sarina Ser, MD

## 2022-02-09 ENCOUNTER — Encounter: Payer: Self-pay | Admitting: Dermatology

## 2022-02-26 ENCOUNTER — Other Ambulatory Visit: Payer: Self-pay | Admitting: Family Medicine

## 2022-02-27 NOTE — Telephone Encounter (Signed)
Requested medication (s) are due for refill today: no  Requested medication (s) are on the active medication list: yes  Last refill:  11/30/21 16 grams 2 RF  Future visit scheduled: yes  Notes to clinic:  ordered as a prn pt pharmacy requesting refill before due   Requested Prescriptions  Pending Prescriptions Disp Refills   fluticasone (FLONASE) 50 MCG/ACT nasal spray [Pharmacy Med Name: FLUTICASONE 50MCG NASAL SP (120) RX] 16 g 2    Sig: USE TWO SPRAYS IN BOTH NOSTRILS DAILY. USE OR FOUR TO SIX WEEKS. THEN STOP AND USE SEASONALLY OR AS NEEDED.     Ear, Nose, and Throat: Nasal Preparations - Corticosteroids Passed - 02/26/2022 10:43 AM      Passed - Valid encounter within last 12 months    Recent Outpatient Visits           2 months ago Morbid obesity with BMI of 50.0-59.9, adult [facility])   [place], DO   4 months ago Abnormal urine odor   [facility] [place], [place], NP   6 months ago Morbid obesity with BMI of 50.0-59.9, adult [facility])   [facility] Olin Hauser, DO   7 months ago Acute non-recurrent maxillary sinusitis   [facility] [place], Coralie Keens, NP   1 year ago Hypothyroidism, unspecified type   [place], DO       Future Appointments             In 1 week [place], Devonne Doughty, DO [facility], [place]   In 2 months Ralene Bathe, MD [place]

## 2022-03-07 ENCOUNTER — Encounter: Payer: Self-pay | Admitting: Family Medicine

## 2022-03-07 DIAGNOSIS — F314 Bipolar disorder, current episode depressed, severe, without psychotic features: Secondary | ICD-10-CM

## 2022-03-07 MED ORDER — LAMOTRIGINE 150 MG PO TABS: 150.0000 mg | ORAL_TABLET | Freq: Every evening | ORAL | 0 refills | Status: DC

## 2022-03-12 ENCOUNTER — Ambulatory Visit (INDEPENDENT_AMBULATORY_CARE_PROVIDER_SITE_OTHER): Payer: Managed Care, Other (non HMO) | Admitting: Family Medicine

## 2022-03-12 ENCOUNTER — Encounter: Payer: Self-pay | Admitting: Family Medicine

## 2022-03-12 VITALS — BP 138/84 | HR 75 | Ht 61.0 in | Wt 288.4 lb

## 2022-03-12 DIAGNOSIS — R7309 Other abnormal glucose: Secondary | ICD-10-CM

## 2022-03-12 DIAGNOSIS — Z6841 Body Mass Index (BMI) 40.0 and over, adult: Secondary | ICD-10-CM

## 2022-03-12 DIAGNOSIS — F314 Bipolar disorder, current episode depressed, severe, without psychotic features: Secondary | ICD-10-CM

## 2022-03-12 DIAGNOSIS — Z0000 Encounter for general adult medical examination without abnormal findings: Secondary | ICD-10-CM

## 2022-03-12 DIAGNOSIS — I6930 Unspecified sequelae of cerebral infarction: Secondary | ICD-10-CM

## 2022-03-12 DIAGNOSIS — Z23 Encounter for immunization: Secondary | ICD-10-CM | POA: Diagnosis not present

## 2022-03-12 DIAGNOSIS — E039 Hypothyroidism, unspecified: Secondary | ICD-10-CM

## 2022-03-12 MED ORDER — INSULIN PEN NEEDLE 32G X 6 MM MISC: 3 refills | Status: DC

## 2022-03-12 MED ORDER — SAXENDA 18 MG/3ML ~~LOC~~ SOPN: PEN_INJECTOR | SUBCUTANEOUS | 0 refills | Status: DC

## 2022-03-12 NOTE — Patient Instructions (Addendum)
Thank you for coming to the office today.  2. Saxenda - DAILY injection - start 0.'6mg'$  injection DAILY, you can increase the dose by 1 notch or 0.6 mg per week, if you don't tolerate a dose, can reduce it the next day.  Flu Shot today  Please schedule a Follow-up Appointment to: Return in about 3 months (around 06/11/2022) for 3-4 month follow-up PreDM A1c Weight Med.  If you have any other questions or concerns, please feel free to call the office or send a message through [place]. You may also schedule an earlier appointment if necessary.  Additionally, you may be receiving a survey about your experience at our office within a few days to 1 week by e-mail or mail. We value your feedback.  Nobie Putnam, DO [place]

## 2022-03-12 NOTE — Progress Notes (Signed)
 Subjective:    Patient ID: Leah Osborne, female    DOB: 06/01/1986, 36 y.o.   MRN: [id number]  Leah Osborne is a 36 y.o. female presenting on 03/12/2022 for Annual Exam   HPI  Here for Annual Physical and lab Review  Morbid Obesity Weight Management Still unavailable Wegovy Switch to Saxenda, new rx today  01/24/22  Glucose 86 A1c 6.2 Creatinine 0.97 eGFR 79 Cholesterol Total 110 TG 79 HDL 33 LDL 61 T Chol/HDL Ratio 3.3   HYPERLIPIDEMIA: - Reports no concerns. Last lipid panel 01/24/22, controlled  - Currently taking Statin therapy, tolerating well without side effects or myalgias   Health Maintenance: Flu Shot today     12/25/2021    8:56 AM 08/22/2021   11:03 AM 08/18/2021    2:25 PM  Depression screen PHQ 2/9  Decreased Interest 1 0 0  Down, Depressed, Hopeless 0 0 0  PHQ - 2 Score 1 0 0  Altered sleeping 0  0  Tired, decreased energy 2  3  Change in appetite 1  1  Feeling bad or failure about yourself  0  0  Trouble concentrating 1  1  Moving slowly or fidgety/restless 0  0  Suicidal thoughts 0  0  PHQ-9 Score 5  5  Difficult doing work/chores Somewhat difficult  Not difficult at all    Past Medical History:  Diagnosis Date   Abnormal Pap smear of cervix    Arthritis    Arthritis, rheumatoid (HCC)    Atypical chest pain    Bipolar 1 disorder (HCC)    Headache    MIGRAINES   Hyperlipidemia    Hypertension    Morbid obesity (HCC)    a. 11/2018 s/p Robotic Sleeve Gastrectomy @ [facility].   Palpitations    a. 09/2017 Zio Monitor: predominantly RSR, avg HR 87, 11 A tach/SVT episodes, fastest 176 x 7 beats, longest 9 beats. Rare PAC's, occas PVC's, rare couplets/triplets.   Persistent Nausea and vomiting    a. Following sleeve gastrectomy 11/2018   PTSD (post-traumatic stress disorder)    PVC (premature ventricular contraction)    Social phobia    Stroke (HCC)    Past Surgical History:  Procedure Laterality Date   CHOLECYSTECTOMY      LAPAROSCOPIC GASTRIC SLEEVE RESECTION  11/19/2018   LAPAROSCOPIC TUBAL LIGATION Bilateral 09/19/2015   Procedure: LAPAROSCOPIC BILATERAL TUBAL BANDING;  Surgeon: Martin A Defrancesco, MD;  Location: [facility] ORS;  Service: Gynecology;  Laterality: Bilateral;   ROBOTIC ASSISTED TOTAL HYSTERECTOMY Bilateral 08/28/2021   Procedure: XI ROBOTIC ASSISTED HYSTERECTOMY, BILATERAL SALPINGECTOMY;  Surgeon: Cherry, Anika, MD;  Location: [facility] ORS;  Service: Gynecology;  Laterality: Bilateral;   Social History   Socioeconomic History   Marital status: Married    Spouse name: Not on file   Number of children: Not on file   Years of education: Not on file   Highest education level: Not on file  Occupational History   Not on file  Tobacco Use   Smoking status: Never   Smokeless tobacco: Never  Vaping Use   Vaping Use: Never used  Substance and Sexual Activity   Alcohol use: Not Currently    Alcohol/week: 0.0 standard drinks of alcohol   Drug use: No   Sexual activity: Yes    Birth control/protection: Surgical    Comment: tubal  Other Topics Concern   Not on file  Social History Narrative   Lives in [place] with husband   Right handed     Social Determinants of Health   Financial Resource Strain: Not on file  Food Insecurity: Not on file  Transportation Needs: Not on file  Physical Activity: Not on file  Stress: Not on file  Social Connections: Not on file  Intimate Partner Violence: Not on file   Family History  Problem Relation Age of Onset   Diabetes Maternal Grandmother    Heart disease Maternal Grandmother    Heart failure Maternal Grandmother    Heart attack Maternal Grandmother    Rheum arthritis Mother    Healthy Father    Heart Problems Maternal Grandfather    Stroke Maternal Grandfather    Atrial fibrillation Paternal Grandmother    Stroke Other        mat great gf had stroke at 79   Cancer Neg Hx    Current Outpatient Medications on File Prior to Visit  Medication Sig    acetaminophen (TYLENOL) 500 MG tablet Take 1,000 mg by mouth every 6 (six) hours as needed for moderate pain or headache.   ASPIRIN LOW DOSE 81 MG tablet TAKE 1 TABLET BY MOUTH  DAILY (SWALLOW WHOLE)   atorvastatin (LIPITOR) 40 MG tablet Take 1 tablet (40 mg total) by mouth daily.   azelastine (ASTELIN) 0.1 % nasal spray Place 2 sprays into both nostrils 2 (two) times daily. Use in each nostril as directed   buPROPion (WELLBUTRIN XL) 150 MG 24 hr tablet TAKE 1 TABLET(150 MG) BY MOUTH DAILY FOR 2 WEEKS THEN STOP. MAY TAKE 1 TABLET EVERY OTHER DAY FOR REMAINING 1-2 WEEKS AS NEEDED   busPIRone (BUSPAR) 10 MG tablet Take 1 tablet (10 mg total) by mouth 3 (three) times daily.   Clobetasol Propionate 0.05 % shampoo APPLY TO DRY SCALP 3 TIMES  WEEKLY AND LET SIT 15 MINUTES  AND RINSE OUT   cyclobenzaprine (FLEXERIL) 5 MG tablet Take 5 mg by mouth 3 (three) times daily as needed.   fluocinonide (LIDEX) 0.05 % external solution Apply 1 application. topically 2 (two) times daily as needed (psoriasis).   fluticasone (FLONASE) 50 MCG/ACT nasal spray USE TWO SPRAYS IN BOTH NOSTRILS DAILY. USE OR FOUR TO SIX WEEKS. THEN STOP AND USE SEASONALLY OR AS NEEDED.   Ivermectin 1 % CREA Apply 1 Application topically every morning.   ketoconazole (NIZORAL) 2 % shampoo Apply 1 Application topically as directed. Wash scalp qd to qod, let sit 10 minutes and rinse out   lamoTRIgine (LAMICTAL) 150 MG tablet Take 1 tablet (150 mg total) by mouth at bedtime.   levothyroxine (SYNTHROID) 25 MCG tablet TAKE 1 TABLET BY MOUTH DAILY  BEFORE BREAKFAST   mometasone (ELOCON) 0.1 % cream Apply 1 Application topically as directed. Apply to aa psoriasis on body qd up to 5 days a week until clear, then prn flares   mupirocin ointment (BACTROBAN) 2 % Apply 1 Application topically daily. Qd to excision site   MYRBETRIQ 50 MG TB24 tablet TAKE 1 TABLET BY MOUTH DAILY (Patient taking differently: 50 mg at bedtime.)   norethindrone (AYGESTIN)  5 MG tablet Take 1 tablet (5 mg total) by mouth daily.   spironolactone (ALDACTONE) 25 MG tablet Take 1 tablet (25 mg total) by mouth daily.   traZODone (DESYREL) 50 MG tablet Take 1 tablet (50 mg total) by mouth at bedtime.   No current facility-administered medications on file prior to visit.    Review of Systems  Constitutional:  Negative for activity change, appetite change, chills, diaphoresis, fatigue and fever.  HENT:  Negative  for congestion and hearing loss.   Eyes:  Negative for visual disturbance.  Respiratory:  Negative for cough, chest tightness, shortness of breath and wheezing.   Cardiovascular:  Negative for chest pain, palpitations and leg swelling.  Gastrointestinal:  Negative for abdominal pain, constipation, diarrhea, nausea and vomiting.  Genitourinary:  Negative for dysuria, frequency and hematuria.  Musculoskeletal:  Negative for arthralgias and neck pain.  Skin:  Negative for rash.  Neurological:  Negative for dizziness, weakness, light-headedness, numbness and headaches.  Hematological:  Negative for adenopathy.  Psychiatric/Behavioral:  Negative for behavioral problems, dysphoric mood and sleep disturbance.    Per HPI unless specifically indicated above    Objective:    BP 138/84   Pulse 75   Ht 5' 1" (1.549 m)   Wt 288 lb 6.4 oz (130.8 kg)   SpO2 99%   BMI 54.49 kg/m   Wt Readings from Last 3 Encounters:  03/12/22 288 lb 6.4 oz (130.8 kg)  12/25/21 285 lb 12.8 oz (129.6 kg)  10/31/21 278 lb (126.1 kg)    Physical Exam Vitals and nursing note reviewed.  Constitutional:      General: She is not in acute distress.    Appearance: She is well-developed. She is obese. She is not diaphoretic.     Comments: Well-appearing, comfortable, cooperative  HENT:     Head: Normocephalic and atraumatic.  Eyes:     General:        Right eye: No discharge.        Left eye: No discharge.     Conjunctiva/sclera: Conjunctivae normal.     Pupils: Pupils are equal,  round, and reactive to light.  Neck:     Thyroid: No thyromegaly.  Cardiovascular:     Rate and Rhythm: Normal rate and regular rhythm.     Pulses: Normal pulses.     Heart sounds: Normal heart sounds. No murmur heard. Pulmonary:     Effort: Pulmonary effort is normal. No respiratory distress.     Breath sounds: Normal breath sounds. No wheezing or rales.  Abdominal:     General: Bowel sounds are normal. There is no distension.     Palpations: Abdomen is soft. There is no mass.     Tenderness: There is no abdominal tenderness.  Musculoskeletal:        General: No tenderness. Normal range of motion.     Cervical back: Normal range of motion and neck supple.     Comments: Upper / Lower Extremities: - Normal muscle tone, strength bilateral upper extremities 5/5, lower extremities 5/5  Lymphadenopathy:     Cervical: No cervical adenopathy.  Skin:    General: Skin is warm and dry.     Findings: No erythema or rash.  Neurological:     Mental Status: She is alert and oriented to person, place, and time.     Comments: Distal sensation intact to light touch all extremities  Psychiatric:        Mood and Affect: Mood normal.        Behavior: Behavior normal.        Thought Content: Thought content normal.     Comments: Well groomed, good eye contact, normal speech and thoughts    Results for orders placed or performed in visit on 01/30/22  Anatomic Pathology Report  Result Value Ref Range   Diagnosis synopsis: Comment    Specimen: Comment    Signs/Symptoms: Comment    Clinical diagnosis: Comment    Diagnosis: Comment      Microscopic description: Comment    Gross description: Comment    Electronically signed by: Comment    CPT code(s): Comment    CPT Disclaimer: Comment    Clinician provided ICD: D49.2    Pathologist provided ICD: L91.8       Assessment & Plan:   Problem List Items Addressed This Visit     Elevated hemoglobin A1c   History of cerebrovascular accident (CVA)  with residual deficit   Hypothyroidism   Severe bipolar I disorder, most recent episode depressed without psychotic features (HCC)   Other Visit Diagnoses     Annual physical exam    -  Primary   Morbid obesity with BMI of 50.0-59.9, adult (HCC)       Relevant Medications   SAXENDA 18 MG/3ML SOPN   Insulin Pen Needle 32G X 6 MM MISC   Needs flu shot       Relevant Orders   Flu Vaccine QUAD 6mo+IM (Fluarix, Fluzone & Alfiuria Quad PF) (Completed)       Morbid Obesity BMI >50-59 Weight management Unable to get Wegovy now manufacturer back order Will rx order Saxenda dose titration, needles  Updated Health Maintenance information Reviewed recent lab results with patient Biometric labs, did not do additional labs Encouraged improvement to lifestyle with diet and exercise Goal of weight loss   Meds ordered this encounter  Medications   SAXENDA 18 MG/3ML SOPN    Sig: Injection 0.6 mg into skin once daily for 1 week, as tolerated increase by increment of 0.6mg every 1 week, max dose is 3mg injection daily after 5 weeks.    Dispense:  15 mL    Refill:  0   Insulin Pen Needle 32G X 6 MM MISC    Sig: Use to inject Saxenda into skin daily.    Dispense:  90 each    Refill:  3      Follow up plan: Return in about 3 months (around 06/11/2022) for 3-4 month follow-up PreDM A1c Weight Med.   , DO [facility] 03/12/2022, 8:36 AM 

## 2022-03-21 ENCOUNTER — Encounter: Payer: Self-pay | Admitting: Family Medicine

## 2022-04-22 NOTE — Progress Notes (Unsigned)
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 )   History of Present Illness: Leah Osborne is a 36 y.o. 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  Back pain mostly became a significant problem around 2017 following a pregnancy at the time.  Back pain and stiffness frequently with nighttime awakenings and symptoms most prominent in the mornings concerning for inflammatory arthritis.  Previously seen in [facility] rheumatology but not following up since 2020. She was previously treated with prescription NSAIDs and did physical therapy for back in 2018.  She had local steroid injections to the SI joints but does not recall an impressive amount of symptom benefit from this.  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  She never saw a large improvement in symptoms with the Humira but only ever continue this for a few months before interrupting.  Short-term treatments with prednisone for various indications along the way have not usually caused a dramatic change in back pain severity.  She is not on any muscle relaxer type medication long-term, has taken this for neck pain issues as well as prednisone and was not sure if this had a large impact in her back pain.  She takes mainly Tylenol arthritis as needed for pain which is mildly helpful. She also sees dermatology for problems including rosacea and psoriasis previously prescribed just topical medication.  She has had psoriasis for many years mostly rashes on the scalp and arms this has never been a severe problem is  currently pretty well controlled on the topical medication.  Labs reivewed 12/2018 HBV neg  03/2018  ANA neg dsDNA neg CCP neg ESR 19 CRP 16.8  07/2016 HLA-B27 neg HCV neg  Imaging reviewed 12/31/17 MRI PELVIS W WO CONTRAST MSK IMPRESSION:  Findings most consistent with chronic nonactive sacroiliitis, similar to the prior exam. No evidence of active sacroiliitis. Low-lying intrauterine device which appears to be at least partially within the cervical canal. This is suboptimally evaluated and pelvic ultrasound may be performed for further characterization.   Activities of Daily Living:  Patient reports morning stiffness for 2 hours.   Patient Reports nocturnal pain.  Difficulty dressing/grooming: Denies Difficulty climbing stairs: Reports Difficulty getting out of chair: Denies Difficulty using hands for taps, buttons, cutlery, and/or writing: Denies  Review of Systems  Constitutional:  Positive for fatigue.  HENT:  Negative for mouth sores and mouth dryness.   Eyes:  Negative for dryness.  Respiratory:  Negative for shortness of breath.   Cardiovascular:  Positive for palpitations. Negative for chest pain.  Gastrointestinal:  Negative for blood in stool, constipation and diarrhea.  Endocrine: Positive for increased urination.  Genitourinary:  Positive for involuntary urination.  Musculoskeletal:  Positive for joint pain, gait problem, joint pain, myalgias, morning stiffness and myalgias. Negative for joint swelling, muscle weakness and muscle tenderness.  Skin:  Positive for color change and sensitivity to sunlight. Negative for rash and hair loss.  Allergic/Immunologic: Negative for susceptible to infections.  Neurological:  Positive for dizziness and headaches.  Hematological:  Negative for swollen glands.  Psychiatric/Behavioral:  Positive for depressed mood. Negative for sleep disturbance. The patient is nervous/anxious.     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   Binocular vision disorder with diplopia 01/15/2019   Intractable nausea and vomiting 01/10/2019   S/P laparoscopic sleeve gastrectomy 01/01/2019   Hypokalemia 01/01/2019   Elevated hemoglobin A1c 02/12/2018   Dyslipidemia 02/05/2018   Abnormal skin growth 11/15/2016   PTSD (post-traumatic stress disorder) 10/24/2016   Severe bipolar I disorder, most recent episode depressed without psychotic features ([place]) 10/23/2016   Ankylosing spondylitis ([place]) 10/23/2016   Sacroiliac inflammation ([place]) 08/02/2016   Hypertension 05/28/2016   Vitamin D deficiency 05/02/2016   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 11/2018 s/p Robotic Sleeve Gastrectomy @ [facility].   Osteoarthritis    Palpitations    a. 09/2017 Zio Monitor: predominantly RSR, avg HR 87, 11 A tach/SVT episodes, fastest 176 x 7 beats, longest 9 beats. Rare PAC's, occas PVC's, rare couplets/triplets.   Persistent Nausea and vomiting    a. Following sleeve gastrectomy 11/2018   PTSD (post-traumatic stress disorder)    PVC (premature ventricular contraction)    Sacroiliitis (HCC)    Social phobia    Stroke [facility])     Family History  Problem Relation Age of Onset   Rheum  arthritis Mother    Healthy Father    Healthy Sister    Healthy Brother    Diabetes Maternal Grandmother    Heart disease Maternal Grandmother    Heart failure Maternal Grandmother    Heart attack Maternal Grandmother    Heart Problems Maternal Grandfather    Stroke Maternal Grandfather    Atrial fibrillation Paternal Grandmother    Stroke Other        mat great gf had stroke at 60   Atrial fibrillation Daughter    Atrial fibrillation Son    Cancer Neg Hx    Past Surgical History:  Procedure Laterality Date   CHOLECYSTECTOMY     LAPAROSCOPIC GASTRIC SLEEVE RESECTION  11/19/2018   LAPAROSCOPIC TUBAL LIGATION Bilateral 09/19/2015   Procedure: LAPAROSCOPIC BILATERAL TUBAL BANDING;  Surgeon: Brayton Mars, MD;  Location: [facility] ORS;  Service: Gynecology;  Laterality: Bilateral;   ROBOTIC ASSISTED TOTAL HYSTERECTOMY Bilateral 08/28/2021   Procedure: XI ROBOTIC ASSISTED HYSTERECTOMY, BILATERAL SALPINGECTOMY;  Surgeon: Rubie Maid, MD;  Location: [facility] ORS;  Service: Gynecology;  Laterality: Bilateral;   Social History   Social History Narrative   Lives in [place] with husband   Right handed   Immunization History  Administered Date(s) Administered   Influenza, Seasonal, Injecte, Preservative Fre 05/30/2011  Influenza,inj,Quad PF,6+ Mos 03/04/2014, 02/22/2015, 04/17/2018, 03/12/2022   Influenza-Unspecified 03/18/2016   MMR 06/15/2015   Pneumococcal Conjugate-13 09/07/2016   Pneumococcal Polysaccharide-23 12/11/2016   Tdap 10/19/2011, 03/21/2015, 11/15/2016     Objective: Vital Signs: BP 131/83 (BP Location: Right Arm, Patient Position: Sitting, Cuff Size: Normal)   Pulse 73   Resp 15   Ht _0  (1.549 m)   Wt 290 lb 9.6 oz (131.8 kg)   LMP 06/27/2021 (Approximate)   BMI 54.91 kg/m    Physical Exam Constitutional:      Appearance: She is obese.  Cardiovascular:     Rate and Rhythm: Normal rate and regular rhythm.  Pulmonary:     Effort: Pulmonary effort is  normal.     Breath sounds: Normal breath sounds.  Musculoskeletal:     Right lower leg: No edema.     Left lower leg: No edema.  Skin:    General: Skin is warm and dry.     Findings: Rash present.     Comments: Rosacea on face Mild patchy erythematous rash on scalp with some scale No nail changes Mild livedo reticularis  Neurological:     Mental Status: She is alert.  Psychiatric:        Mood and Affect: Mood normal.      Musculoskeletal Exam:  Shoulders full ROM no tenderness or swelling Elbows full ROM no tenderness or swelling Wrists full ROM no tenderness or swelling Fingers full ROM no tenderness or swelling Lateral hip pain provoked with FABER maneuver, more on right side than left Knees full ROM patellofemoral crepitus present, slight valgus deformity, mild joint line tenderness Ankles full ROM no tenderness or swelling  Investigation: No additional findings.  Imaging: XR Lumbar Spine 2-3 Views  Result Date: 04/23/2022 X-ray lumbar spine 2 views Possible slight lateral spondylolisthesis present with slight rotation present film difficult to assess.  Vertebral body and disc spaces appear normal height.  Possible very early lateral osteophyte process starting at L4-L5 level.  No syndesmophytes or bridging osteophyte changes seen.  No acute appearing bony abnormality. Impression No significant acute bony abnormality no evidence of ankylosis present  XR Pelvis 1-2 Views  Result Date: 04/23/2022 X-ray pelvis 2 views AP and lateral SI joints are patent bilaterally increase sclerosis on the iliac border both sides.  No visible erosions joint space widening or narrowing.  Hip joints appear normal bilaterally.  Appears to be sacralization of L5 on the right side.  No erosions or abnormal joint calcifications seen. Impression SI joint changes possibly consistent with sacroiliitis versus osteitis condensans ilii   Recent Labs: Lab Results  Component Value Date   WBC 8.2  10/31/2021   HGB 12.4 10/31/2021   PLT 409 (H) 10/31/2021   NA 141 10/31/2021   K 4.0 10/31/2021   CL 105 10/31/2021   CO2 27 10/31/2021   GLUCOSE 95 10/31/2021   BUN 18 10/31/2021   CREATININE 1.0 01/24/2022   BILITOT 0.5 10/31/2021   ALKPHOS 83 10/31/2021   AST 19 10/31/2021   ALT 17 10/31/2021   PROT 7.3 10/31/2021   ALBUMIN 4.1 10/31/2021   CALCIUM 9.6 10/31/2021   GFRAA 117 02/19/2019    Speciality Comments: No specialty comments available.  Procedures:  No procedures performed Allergies: Patient has no known allergies.   Assessment / Plan:     Visit Diagnoses: Ankylosing spondylitis of multiple sites in spine (HCC)  Osteitis condensans ilii Sacroiliac inflammation ([place])- Plan: XR Pelvis 1-2 Views, XR Lumbar Spine  2-3 Views, Sedimentation rate, C-reactive protein, cyclobenzaprine (FLEXERIL) 10 MG tablet  Suspected AAS diagnosis from previous rheumatology evaluation and follow-up over a few years.  Does describe chronic low back pain with early age of onset nighttime predominance prominent morning stiffness features.  Does not have objective inflammatory joint changes in any peripheral areas on exam.  Checking sedimentation rate and CRP for inflammatory disease activity assessment.  X-ray of the pelvis and lumbar spine sclerosis of the SI joints does look consistent with possible osteitis condensans.  Ultimately I think we will need to try additional class of immunomodulatory drug for treatment or at least therapeutic trial.  With her diagnosis of psoriasis would definitely consider IL-17 medication as an option for this.  In the meantime also recommend trial of Flexeril 10 mg at nighttime for the back pain and sleep fragmentation.  Psoriasis vulgaris  Seems to be probably a low skin disease activity at this time with the topical medications for control so far.  S/P laparoscopic sleeve gastrectomy  Not a good candidate for NSAID medications would also hope to avoid any  long-term steroid or other medications with GI risk.  High risk medication use - Plan: CBC with Differential/Platelet, QuantiFERON-TB Gold Plus, CMP14+EGFR  Discussed risks of IL-17 medication such as Cosentyx or Taltz.  Including injection site reactions, infections, need for periodic laboratory monitoring.  Continue baseline labs including CBC and CMP and QuantiFERON.  Orders: Orders Placed This Encounter  Procedures   XR Pelvis 1-2 Views   XR Lumbar Spine 2-3 Views   Sedimentation rate   C-reactive protein   CBC with Differential/Platelet   QuantiFERON-TB Gold Plus   CMP14+EGFR   Meds ordered this encounter  Medications   cyclobenzaprine (FLEXERIL) 10 MG tablet    Sig: Take 1 tablet (10 mg total) by mouth at bedtime as needed for muscle spasms.    Dispense:  30 tablet    Refill:  0     Follow-Up Instructions: Return in about 4 weeks (around 05/21/2022) for New pt AS/PsA? (cosentyx?) f/u 48mo   CCollier Salina MD  Note - This record has been created using [employer]  Chart creation errors have been sought, but may not always  have been located. Such creation errors do not reflect on  the standard of medical care.

## 2022-04-23 ENCOUNTER — Ambulatory Visit: Payer: Managed Care, Other (non HMO) | Attending: Internal Medicine | Admitting: Internal Medicine

## 2022-04-23 ENCOUNTER — Encounter: Payer: Self-pay | Admitting: Internal Medicine

## 2022-04-23 ENCOUNTER — Ambulatory Visit (INDEPENDENT_AMBULATORY_CARE_PROVIDER_SITE_OTHER): Payer: Managed Care, Other (non HMO)

## 2022-04-23 VITALS — BP 131/83 | HR 73 | Resp 15 | Ht 61.0 in | Wt 290.6 lb

## 2022-04-23 DIAGNOSIS — M461 Sacroiliitis, not elsewhere classified: Secondary | ICD-10-CM

## 2022-04-23 DIAGNOSIS — M8538 Osteitis condensans, other site: Secondary | ICD-10-CM | POA: Diagnosis not present

## 2022-04-23 DIAGNOSIS — M450 Ankylosing spondylitis of multiple sites in spine: Secondary | ICD-10-CM

## 2022-04-23 DIAGNOSIS — Z79899 Other long term (current) drug therapy: Secondary | ICD-10-CM

## 2022-04-23 DIAGNOSIS — L400 Psoriasis vulgaris: Secondary | ICD-10-CM | POA: Diagnosis not present

## 2022-04-23 DIAGNOSIS — Z9884 Bariatric surgery status: Secondary | ICD-10-CM

## 2022-04-23 MED ORDER — CYCLOBENZAPRINE HCL 10 MG PO TABS: 10.0000 mg | ORAL_TABLET | Freq: Every evening | ORAL | 0 refills | Status: DC | PRN

## 2022-04-28 LAB — CMP14+EGFR
ALT: 11 IU/L (ref 0–32)
AST: 14 IU/L (ref 0–40)
Albumin/Globulin Ratio: 1.9 (ref 1.2–2.2)
Albumin: 4.1 g/dL (ref 3.9–4.9)
Alkaline Phosphatase: 100 IU/L (ref 44–121)
BUN/Creatinine Ratio: 13 (ref 9–23)
BUN: 11 mg/dL (ref 6–20)
Bilirubin Total: 0.3 mg/dL (ref 0.0–1.2)
CO2: 23 mmol/L (ref 20–29)
Calcium: 9.4 mg/dL (ref 8.7–10.2)
Chloride: 101 mmol/L (ref 96–106)
Creatinine, Ser: 0.86 mg/dL (ref 0.57–1.00)
Globulin, Total: 2.2 g/dL (ref 1.5–4.5)
Glucose: 105 mg/dL — ABNORMAL HIGH (ref 70–99)
Potassium: 4.1 mmol/L (ref 3.5–5.2)
Sodium: 138 mmol/L (ref 134–144)
Total Protein: 6.3 g/dL (ref 6.0–8.5)
eGFR: 91 mL/min/{1.73_m2} (ref >59)

## 2022-04-28 LAB — CBC WITH DIFFERENTIAL/PLATELET
Basophils Absolute: 0 10*3/uL (ref 0.0–0.2)
Basos: 0 %
EOS (ABSOLUTE): 0.2 10*3/uL (ref 0.0–0.4)
Eos: 2 %
Hematocrit: 38.6 % (ref 34.0–46.6)
Hemoglobin: 12.3 g/dL (ref 11.1–15.9)
Immature Grans (Abs): 0 10*3/uL (ref 0.0–0.1)
Immature Granulocytes: 0 %
Lymphocytes Absolute: 2.3 10*3/uL (ref 0.7–3.1)
Lymphs: 23 %
MCH: 24.8 pg — ABNORMAL LOW (ref 26.6–33.0)
MCHC: 31.9 g/dL (ref 31.5–35.7)
MCV: 78 fL — ABNORMAL LOW (ref 79–97)
Monocytes Absolute: 0.6 10*3/uL (ref 0.1–0.9)
Monocytes: 6 %
Neutrophils Absolute: 7 10*3/uL (ref 1.4–7.0)
Neutrophils: 69 %
Platelets: 463 10*3/uL — ABNORMAL HIGH (ref 150–450)
RBC: 4.96 x10E6/uL (ref 3.77–5.28)
RDW: 14.5 % (ref 11.7–15.4)
WBC: 10.2 10*3/uL (ref 3.4–10.8)

## 2022-04-28 LAB — QUANTIFERON-TB GOLD PLUS
QuantiFERON Mitogen Value: 8.69 IU/mL
QuantiFERON Nil Value: 0 IU/mL
QuantiFERON TB1 Ag Value: 0 IU/mL
QuantiFERON TB2 Ag Value: 0 IU/mL
QuantiFERON-TB Gold Plus: NEGATIVE

## 2022-04-28 LAB — C-REACTIVE PROTEIN: CRP: 6 mg/L (ref 0–10)

## 2022-04-28 LAB — SEDIMENTATION RATE: Sed Rate: 30 mm/hr (ref 0–32)

## 2022-05-11 ENCOUNTER — Other Ambulatory Visit: Payer: Self-pay | Admitting: Obstetrics and Gynecology

## 2022-05-11 DIAGNOSIS — N3281 Overactive bladder: Secondary | ICD-10-CM

## 2022-05-16 ENCOUNTER — Ambulatory Visit: Payer: Managed Care, Other (non HMO) | Admitting: Dermatology

## 2022-05-24 ENCOUNTER — Encounter: Payer: Self-pay | Admitting: Family Medicine

## 2022-05-24 DIAGNOSIS — F4310 Post-traumatic stress disorder, unspecified: Secondary | ICD-10-CM

## 2022-05-24 DIAGNOSIS — F419 Anxiety disorder, unspecified: Secondary | ICD-10-CM

## 2022-05-28 NOTE — Progress Notes (Unsigned)
Office Visit Note  Patient: Leah Osborne             Date of Birth: 1986/03/23           MRN: [id number]             PCP: Olin Hauser, DO Referring: Nobie Putnam * Visit Date: 05/29/2022   Subjective:  No chief complaint on file.   History of Present Illness: Leah Osborne is a 36 y.o. 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 Updated xrays at initial visit demonstrate bilateral SI joint changes but not specific for inflammatory process. Labs were negative for increased inflammatory markers also negative screening labs for DMARD treatment.   Previous HPI 04/23/22 Leah Osborne is a 36 y.o. 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  Back pain mostly became a significant problem around 2017 following a pregnancy at the time.  Back pain and stiffness frequently with nighttime awakenings and symptoms most prominent in the mornings concerning for inflammatory arthritis.  Previously seen in [facility] rheumatology but not following up since 2020. She was previously treated with prescription NSAIDs and did physical therapy for back in 2018.  She had local steroid injections to the SI joints but does not recall an impressive amount of symptom benefit from this.  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  She never saw a large improvement in symptoms with the Humira but only ever continue this for a few months before interrupting.  Short-term treatments with prednisone for various indications along the way have not usually caused a dramatic change in back pain severity.  She is not on any muscle relaxer type medication long-term, has taken this for neck pain issues as well as prednisone and was not sure if this had a large impact in her back pain.  She takes mainly Tylenol  arthritis as needed for pain which is mildly helpful. She also sees dermatology for problems including rosacea and psoriasis previously prescribed just topical medication.  She has had psoriasis for many years mostly rashes on the scalp and arms this has never been a severe problem is currently pretty well controlled on the topical medication.   Labs reivewed 12/2018 HBV neg  03/2018  ANA neg dsDNA neg CCP neg ESR 19 CRP 16.8   07/2016 HLA-B27 neg HCV neg   Imaging reviewed 12/31/17 MRI PELVIS W WO CONTRAST MSK IMPRESSION:  Findings most consistent with chronic nonactive sacroiliitis, similar to the prior exam. No evidence of active sacroiliitis. Low-lying intrauterine device which appears to be at least partially within the cervical canal. This is suboptimally evaluated and pelvic ultrasound may be performed for further characterization.   DMARD Hx Humira - lack of response   No Rheumatology ROS completed.   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   Binocular vision disorder with diplopia 01/15/2019   Intractable nausea and vomiting 01/10/2019   S/P laparoscopic sleeve gastrectomy 01/01/2019   Hypokalemia 01/01/2019   Elevated hemoglobin A1c 02/12/2018   Dyslipidemia 02/05/2018   Abnormal skin growth 11/15/2016   PTSD (post-traumatic stress disorder) 10/24/2016   Severe bipolar I disorder, most recent episode depressed without psychotic features ([place]) 10/23/2016  Ankylosing spondylitis ([place]) 10/23/2016   Sacroiliac inflammation ([place]) 08/02/2016   Hypertension 05/28/2016   Vitamin D deficiency 05/02/2016   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 11/2018 s/p Robotic Sleeve Gastrectomy @ [facility].   Osteoarthritis    Palpitations    a. 09/2017 Zio Monitor: predominantly RSR, avg HR 87, 11 A tach/SVT episodes, fastest 176 x 7 beats, longest 9 beats. Rare PAC's, occas PVC's, rare couplets/triplets.   Persistent Nausea and vomiting    a. Following sleeve gastrectomy 11/2018   PTSD (post-traumatic stress disorder)    PVC (premature ventricular contraction)    Sacroiliitis (HCC)    Social phobia    Stroke [facility])     Family History  Problem Relation Age of Onset   Rheum arthritis Mother    Healthy Father    Healthy Sister    Healthy Brother    Diabetes Maternal Grandmother    Heart disease Maternal Grandmother    Heart failure Maternal Grandmother    Heart attack Maternal Grandmother    Heart Problems Maternal Grandfather    Stroke Maternal Grandfather    Atrial fibrillation Paternal Grandmother    Stroke Other        mat great gf had stroke at 49   Atrial fibrillation Daughter    Atrial fibrillation Son    Cancer Neg Hx    Past Surgical History:  Procedure Laterality Date   CHOLECYSTECTOMY     LAPAROSCOPIC GASTRIC SLEEVE RESECTION  11/19/2018   LAPAROSCOPIC TUBAL LIGATION Bilateral 09/19/2015   Procedure: LAPAROSCOPIC BILATERAL TUBAL BANDING;  Surgeon: Brayton Mars, MD;  Location: [facility] ORS;  Service: Gynecology;  Laterality: Bilateral;   ROBOTIC ASSISTED TOTAL HYSTERECTOMY Bilateral 08/28/2021   Procedure: XI ROBOTIC ASSISTED HYSTERECTOMY, BILATERAL SALPINGECTOMY;  Surgeon:  Rubie Maid, MD;  Location: [facility] ORS;  Service: Gynecology;  Laterality: Bilateral;   Social History   Social History Narrative   Lives in [place] with husband   Right handed   Immunization History  Administered Date(s) Administered   Influenza, Seasonal, Injecte, Preservative Fre 05/30/2011   Influenza,inj,Quad PF,6+ Mos 03/04/2014, 02/22/2015, 04/17/2018, 03/12/2022   Influenza-Unspecified 03/18/2016   MMR 06/15/2015   Pneumococcal Conjugate-13 09/07/2016   Pneumococcal Polysaccharide-23 12/11/2016   Tdap 10/19/2011, 03/21/2015, 11/15/2016     Objective: Vital Signs: LMP 06/27/2021 (Approximate)    Physical Exam   Musculoskeletal Exam: ***  CDAI Exam: CDAI Score: -- Patient Global: --; Provider Global: -- Swollen: --; Tender: -- Joint Exam 05/29/2022   No joint exam has been documented for this visit   There is currently no information documented on the homunculus. Go to the Rheumatology activity and complete the homunculus joint exam.  Investigation: No additional findings.  Imaging: No results found.  Recent Labs: Lab Results  Component Value Date   WBC 10.2 04/25/2022   HGB  12.3 04/25/2022   PLT 463 (H) 04/25/2022   NA 138 04/25/2022   K 4.1 04/25/2022   CL 101 04/25/2022   CO2 23 04/25/2022   GLUCOSE 105 (H) 04/25/2022   BUN 11 04/25/2022   CREATININE 0.86 04/25/2022   BILITOT 0.3 04/25/2022   ALKPHOS 100 04/25/2022   AST 14 04/25/2022   ALT 11 04/25/2022   PROT 6.3 04/25/2022   ALBUMIN 4.1 04/25/2022   CALCIUM 9.4 04/25/2022   GFRAA 117 02/19/2019   QFTBGOLDPLUS Negative 04/25/2022    Speciality Comments: No specialty comments available.  Procedures:  No procedures performed Allergies: Patient has no known allergies.   Assessment / Plan:     Visit Diagnoses: No diagnosis found.  ***  Orders: No orders of the defined types were placed in this encounter.  No orders of the defined types were placed in this  encounter.    Follow-Up Instructions: No follow-ups on file.   Collier Salina, MD  Note - This record has been created using [employer].  Chart creation errors have been sought, but may not always  have been located. Such creation errors do not reflect on  the standard of medical care.

## 2022-05-29 ENCOUNTER — Ambulatory Visit: Payer: Managed Care, Other (non HMO) | Attending: Internal Medicine | Admitting: Internal Medicine

## 2022-05-29 ENCOUNTER — Telehealth: Payer: Self-pay | Admitting: Pharmacist

## 2022-05-29 ENCOUNTER — Encounter: Payer: Self-pay | Admitting: Internal Medicine

## 2022-05-29 VITALS — BP 119/81 | HR 96 | Resp 15 | Ht 61.0 in | Wt 292.0 lb

## 2022-05-29 DIAGNOSIS — M450 Ankylosing spondylitis of multiple sites in spine: Secondary | ICD-10-CM | POA: Diagnosis not present

## 2022-05-29 DIAGNOSIS — M461 Sacroiliitis, not elsewhere classified: Secondary | ICD-10-CM | POA: Diagnosis not present

## 2022-05-29 DIAGNOSIS — M8538 Osteitis condensans, other site: Secondary | ICD-10-CM

## 2022-05-29 DIAGNOSIS — Z79899 Other long term (current) drug therapy: Secondary | ICD-10-CM | POA: Diagnosis not present

## 2022-05-29 DIAGNOSIS — L400 Psoriasis vulgaris: Secondary | ICD-10-CM

## 2022-05-29 NOTE — Patient Instructions (Signed)
Secukinumab Injection What is this medication? SECUKINUMAB (sek ue KIN ue mab) treats autoimmune conditions, such as psoriasis and arthritis. It works by slowing down an overactive immune system. It is a monoclonal antibody. This medicine may be used for other purposes; ask your health care provider or pharmacist if you have questions. COMMON BRAND NAME(S): Cosentyx What should I tell my care team before I take this medication? They need to know if you have any of these conditions: Crohn's disease, ulcerative colitis, or other inflammatory bowel disease Immune system problems Infection or history of infection, such as a viral infection, chickenpox, cold sores, or herpes Recently received or are scheduled to receive a vaccine Tuberculosis, a positive skin test for tuberculosis, or recent close contact with someone who has tuberculosis An unusual or allergic reaction to secukinumab, latex, rubber, other medications, foods, dyes, or preservatives Pregnant or trying to get pregnant Breast-feeding How should I use this medication? This medication is injected under the skin. It can be given by your care team in a hospital or clinic setting. It may also be given at home. If you get this medication at home, you will be taught how to prepare and give it. Use it exactly as directed. Take it as directed on the prescription label. Keep taking it unless your care team tells you to stop. It is important that you put your used needles and syringes in a special sharps container. Do not put them in a trash can. If you do not have a sharps container, call your pharmacist or care team to get one. A special MedGuide will be given to you by the pharmacist with each prescription and refill. Be sure to read this information carefully each time. Talk to your care team about the use of this medication in children. While it may be prescribed for children as young as 2 years for selected conditions, precautions do  apply. Overdosage: If you think you have taken too much of this medicine contact a poison control center or emergency room at once. NOTE: This medicine is only for you. Do not share this medicine with others. What if I miss a dose? If you get this medication at the hospital or clinic: It is important not to miss your dose. Call your care team if you are unable to keep an appointment. If you give yourself this medication at home: If you miss a dose, take it as soon as you can. If it is almost time for your next dose, take only that dose. Do not take double or extra doses. Call your care team with questions. What may interact with this medication? Live virus vaccines This list may not describe all possible interactions. Give your health care provider a list of all the medicines, herbs, non-prescription drugs, or dietary supplements you use. Also tell them if you smoke, drink alcohol, or use illegal drugs. Some items may interact with your medicine. What should I watch for while using this medication? Visit your care team for regular checks on your progress. Tell your care team if your symptoms do not start to get better or if they get worse. You will be tested for tuberculosis (TB) before you start this medication. If your care team prescribes any medication for TB, you should start taking the TB medication before starting this medication. Make sure to finish the full course of TB medication. This medication may increase your risk of getting an infection. Call your care team for advice if you get a fever,  chills, sore throat, or other symptoms of a cold or flu. Do not treat yourself. Try to avoid being around people who are sick. This medication can decrease the response to a vaccine. If you need to get vaccinated, tell your care team if you have received this medication within the last 6 months. Extra booster doses may be needed. Talk to your care team to see if a different vaccination schedule is  needed. What side effects may I notice from receiving this medication? Side effects that you should report to your care team as soon as possible: Allergic reactions--skin rash, itching, hives, swelling of the face, lips, tongue, or throat Dry, itchy, scaly patches of skin that blister or peel Infection--fever, chills, cough, sore throat, wounds that don't heal, pain or trouble when passing urine, general feeling of discomfort or being unwell Sudden or severe stomach pain, bloody diarrhea, fever, nausea, vomiting Side effects that usually do not require medical attention (report these to your care team if they continue or are bothersome): Diarrhea Runny or stuffy nose Sore throat This list may not describe all possible side effects. Call your doctor for medical advice about side effects. You may report side effects to FDA at 1-800-FDA-1088. Where should I keep my medication? Keep out of the reach of children and pets. Store in the refrigerator. Do not freeze. Keep it in the original carton until you are ready to use it. Protect from light. Do not shake. Remove the dose from the refrigerator about 30 minutes before it is time for you to use it. Use it within 4 days of removing it from the carton. Get rid of any unused medication after the expiration date. To get rid of medications that are no longer needed or have expired: Take the medication to a medication take-back program. Check with your pharmacy or law enforcement to find a location. If you cannot return the medication, ask your pharmacist or care team how to get rid of this medication safely. NOTE: This sheet is a summary. It may not cover all possible information. If you have questions about this medicine, talk to your doctor, pharmacist, or health care provider.  2023 Elsevier/Gold Standard (2013-07-09 00:00:00)

## 2022-05-29 NOTE — Telephone Encounter (Addendum)
Please start COSENTYX sq BIV.  Dose: with a loading dose: 300 mg at weeks 0, 1, 2, 3, and 4 followed by 300 mg SQ every 4 weeks;   Dx: Ankylosing spondylitis (M45.0) and Psoriasis (L40.9) (PSORIASIS is PRIMARY DX per discussion with Dr. Dimple Casey on 05/30/22)  Previously tried therapies: Humira - primary non-responder Naproxen Celecoxib  ----- Message from Audrie Lia, RT sent at 05/29/2022  3:17 PM EST ----- Regarding: NEW START COSENTYX

## 2022-05-30 NOTE — Telephone Encounter (Signed)
Patient has both commercial [employer] plan and [facility] [place] Medicaid plan.  Submitted a Prior Authorization request to [facility] for COSENTYX [place] via CoverMyMeds. Will update once we receive a response.  Key: [id number]   Submitted a Prior Authorization request to [facility] for [place] via CoverMyMeds. Will update once we receive a response.  Key: [id number]  Knox Saliva, PharmD, MPH, BCPS, CPP Clinical Pharmacist (Rheumatology and Pulmonology)

## 2022-05-31 ENCOUNTER — Telehealth: Payer: Self-pay | Admitting: Pharmacist

## 2022-05-31 NOTE — Telephone Encounter (Signed)
Submitted a Prior Authorization request to [facility] for TALTZ via CoverMyMeds. Will update once we receive a response.  Key: [id number]  Taltz Dose: 160 mg at Week 0, followed by 80 mg at weeks 2, 4, 6, 8, 10, and 12, and then 80 mg every 4 weeks  Knox Saliva, PharmD, MPH, BCPS, CPP Clinical Pharmacist (Rheumatology and Pulmonology)

## 2022-05-31 NOTE — Telephone Encounter (Signed)
Received a fax regarding Prior Authorization from [facility] for [place]. Authorization has been DENIED because: (1) Both of the following: (A) One of the following: (I) There are paid claims or your doctor submits medical records (for example: chart notes) showing that you have tried or cannot use three of the following: Cimzia, Enbrel, Humira/Amjevita/Cyltezo/Hyrimoz/Brand adalimumab-adaz, [place], [place], [place]. - pt is primary non-responder to Humira therefore not a candidate for Enbrel, Humira; Skyrizi, Stelara, and Tremfya not approved for comorbid ankylosing spondylitis (II) Both of the following: (a) There are paid claims or your doctor provides medical records (for example: chart notes) showing that you are continuing the Cosentyx therapy (defined as no more than a 45-day gap in therapy). (b) Your doctor tells [place] that this treatment is working for your condition as evidenced by one of the following: (i) Less part of your body is affected after the treatment (reduction the body surface area involvement from baseline). (ii) You have less symptoms after the treatment (improvement in symptoms, for example: pruritus, inflammation, from baseline). (B) There are paid claims or your doctor submits medical records (for example: chart notes) showing that you have tried or cannot use Taltz.  Per Dr. Benjamine Mola - ok to move forward with Taltz. Patient aware that either Cosentyx or Donnetta Hail may be approved by insurance. Will start [place].  Taltz Dose: 160 mg at Week 0, followed by 80 mg at weeks 2, 4, 6, 8, 10, and 12, and then 80 mg every 4 weeks  Knox Saliva, PharmD, MPH, BCPS, CPP Clinical Pharmacist (Rheumatology and Pulmonology)

## 2022-06-02 ENCOUNTER — Other Ambulatory Visit: Payer: Self-pay | Admitting: Family Medicine

## 2022-06-02 DIAGNOSIS — F314 Bipolar disorder, current episode depressed, severe, without psychotic features: Secondary | ICD-10-CM

## 2022-06-04 ENCOUNTER — Other Ambulatory Visit (HOSPITAL_COMMUNITY): Payer: Self-pay

## 2022-06-04 NOTE — Telephone Encounter (Signed)
Received notification from [facility] regarding a prior authorization for [place]. Authorization has been APPROVED from 05/31/2022 to 11/30/2022. Approval letter sent to scan center.  Unable to run test claim because billing information does not appear to be correct. Will have to attempt again later  Authorization # [id number] Phone # [phone]  Knox Saliva, PharmD, MPH, BCPS, CPP Clinical Pharmacist (Rheumatology and Pulmonology)

## 2022-06-04 NOTE — Telephone Encounter (Signed)
Requested Prescriptions  Pending Prescriptions Disp Refills   lamoTRIgine (LAMICTAL) 150 MG tablet [Pharmacy Med Name: LAMOTRIGINE '150MG'$  TABLETS] 90 tablet 0    Sig: TAKE 1 TABLET(150 MG) BY MOUTH AT BEDTIME     Neurology:  Anticonvulsants - lamotrigine Passed - 06/02/2022  3:23 AM      Passed - Cr in normal range and within 360 days    Creat  Date Value Ref Range Status  01/28/2019 0.88 0.50 - 1.10 mg/dL Final   Creatinine, Ser  Date Value Ref Range Status  04/25/2022 0.86 0.57 - 1.00 mg/dL Final   Creatinine, Urine  Date Value Ref Range Status  04/08/2015 92 mg/dL Final         Passed - ALT in normal range and within 360 days    ALT  Date Value Ref Range Status  04/25/2022 11 0 - 32 IU/L Final   SGPT (ALT)  Date Value Ref Range Status  07/19/2011 29 U/L Final    Comment:    12-78 NOTE: NEW REFERENCE RANGE 05/11/2011          Passed - AST in normal range and within 360 days    AST  Date Value Ref Range Status  04/25/2022 14 0 - 40 IU/L Final   SGOT(AST)  Date Value Ref Range Status  07/19/2011 19 15 - 37 Unit/L Final         Passed - Completed PHQ-2 or PHQ-9 in the last 360 days      Passed - Valid encounter within last 12 months    Recent Outpatient Visits           2 months ago Annual physical exam   [place], DO   5 months ago Morbid obesity with BMI of 50.0-59.9, adult [facility])   [place], DO   8 months ago Abnormal urine odor   [facility] [place], [place] W, NP   9 months ago Morbid obesity with BMI of 50.0-59.9, adult [facility])   [facility] Olin Hauser, DO   10 months ago Acute non-recurrent maxillary sinusitis   [facility] [place], Coralie Keens, NP       Future Appointments             In 3 weeks [place], Devonne Doughty, [facility], [place]   In 2 months Rice, Resa Miner, MD [facility]  Rheumatology [facility] [place] [facility]. [place]

## 2022-06-05 ENCOUNTER — Ambulatory Visit: Admit: 2022-06-05 | Payer: Managed Care, Other (non HMO)

## 2022-06-05 ENCOUNTER — Ambulatory Visit (INDEPENDENT_AMBULATORY_CARE_PROVIDER_SITE_OTHER): Payer: Managed Care, Other (non HMO) | Admitting: Family Medicine

## 2022-06-05 ENCOUNTER — Ambulatory Visit: Payer: Self-pay | Admitting: *Deleted

## 2022-06-05 ENCOUNTER — Encounter: Payer: Self-pay | Admitting: Family Medicine

## 2022-06-05 VITALS — BP 132/88 | HR 73 | Ht 61.0 in | Wt 287.0 lb

## 2022-06-05 DIAGNOSIS — J9801 Acute bronchospasm: Secondary | ICD-10-CM | POA: Diagnosis not present

## 2022-06-05 DIAGNOSIS — J0110 Acute frontal sinusitis, unspecified: Secondary | ICD-10-CM | POA: Diagnosis not present

## 2022-06-05 MED ORDER — BENZONATATE 100 MG PO CAPS: 100.0000 mg | ORAL_CAPSULE | Freq: Three times a day (TID) | ORAL | 0 refills | Status: DC | PRN

## 2022-06-05 MED ORDER — ALBUTEROL SULFATE HFA 108 (90 BASE) MCG/ACT IN AERS: 2.0000 | INHALATION_SPRAY | Freq: Four times a day (QID) | RESPIRATORY_TRACT | 2 refills | Status: DC | PRN

## 2022-06-05 MED ORDER — AMOXICILLIN-POT CLAVULANATE 875-125 MG PO TABS: 1.0000 | ORAL_TABLET | Freq: Two times a day (BID) | ORAL | 0 refills | Status: DC

## 2022-06-05 MED ORDER — PREDNISONE 20 MG PO TABS: ORAL_TABLET | ORAL | 0 refills | Status: DC

## 2022-06-05 NOTE — Patient Instructions (Addendum)
Thank you for coming to the office today.  Start antibiotic course Prednisone taper Albuterol inhaler Start Tessalon Perls take 1 capsule up to 3 times a day as needed for cough  Dentist recommendation for dry mouth - Mixture of equal parts Children's Benadryl and Pepto Bismol (2.5 mL of each = 51m total) Swish and spit and rinse. Do not swallow   Please schedule a Follow-up Appointment to: Return if symptoms worsen or fail to improve.  If you have any other questions or concerns, please feel free to call the office or send a message through [place] You may also schedule an earlier appointment if necessary.  Additionally, you may be receiving a survey about your experience at our office within a few days to 1 week by e-mail or mail. We value your feedback.  ANobie Putnam DO [place]

## 2022-06-05 NOTE — Telephone Encounter (Signed)
  Chief Complaint: wheezing  Symptoms: flu like sx , NT can hear patient wheezing over the phone  Frequency: 3 days  Pertinent Negatives: Patient denies chest pain  Disposition: '[x]'$ ED /'[]'$ Urgent Care (no appt availability in office) / '[]'$ Appointment(In office/virtual)/ '[]'$  [place] [facility]/ '[]'$ Home Care/ '[]'$ Refused Recommended Disposition /'[]'$ [place] [facility]/ '[]'$  Follow-up with PCP Additional Notes:   Recommended ED now     Reason for Disposition  Wheezing can be heard across the room  Answer Assessment - Initial Assessment Questions 1. RESPIRATORY STATUS: "Describe your breathing?" (e.g., wheezing, shortness of breath, unable to speak, severe coughing)      Wheezing  2. ONSET: "When did this breathing problem begin?"      3 days ago  3. PATTERN "Does the difficult breathing come and go, or has it been constant since it started?"      Can hear wheezing over the phone  4. SEVERITY: "How bad is your breathing?" (e.g., mild, moderate, severe)    - MILD: No SOB at rest, mild SOB with walking, speaks normally in sentences, can lie down, no retractions, pulse < 100.    - MODERATE: SOB at rest, SOB with minimal exertion and prefers to sit, cannot lie down flat, speaks in phrases, mild retractions, audible wheezing, pulse 100-120.    - SEVERE: Very SOB at rest, speaks in single words, struggling to breathe, sitting hunched forward, retractions, pulse > 120      Na  5. RECURRENT SYMPTOM: "Have you had difficulty breathing before?" If Yes, ask: "When was the last time?" and "What happened that time?"      na 6. CARDIAC HISTORY: "Do you have any history of heart disease?" (e.g., heart attack, angina, bypass surgery, angioplasty)      na 7. LUNG HISTORY: "Do you have any history of lung disease?"  (e.g., pulmonary embolus, asthma, emphysema)     na 8. CAUSE: "What do you think is causing the breathing problem?"      Flu like sx  9. OTHER SYMPTOMS: "Do you have any other symptoms? (e.g.,  dizziness, runny nose, cough, chest pain, fever)     Reports has had flu like sx   06/01/22 negative for covid  10. O2 SATURATION MONITOR:  "Do you use an oxygen saturation monitor (pulse oximeter) at home?" If Yes, ask: "What is your reading (oxygen level) today?" "What is your usual oxygen saturation reading?" (e.g., 95%)       na 11. PREGNANCY: "Is there any chance you are pregnant?" "When was your last menstrual period?"       na 12. TRAVEL: "Have you traveled out of the country in the last month?" (e.g., travel history, exposures)       na  Protocols used: Breathing Difficulty-A-AH

## 2022-06-05 NOTE — Progress Notes (Signed)
Subjective:    Patient ID: Leah Osborne, female    DOB: [birth year], 36 y.o.   MRN: [id number]  Leah Osborne is a 36 y.o. female presenting on 06/05/2022 for Wheezing and Cough   HPI  Sinusitis Reports symptoms started on Friday recently Home COVID Test and RSV negative Laying down cougfhing spell at night productive Mucinex   Health Maintenance: Flu shot updated     12/25/2021    8:56 AM 08/22/2021   11:03 AM 08/18/2021    2:25 PM  Depression screen PHQ 2/9  Decreased Interest 1 0 0  Down, Depressed, Hopeless 0 0 0  PHQ - 2 Score 1 0 0  Altered sleeping 0  0  Tired, decreased energy 2  3  Change in appetite 1  1  Feeling bad or failure about yourself  0  0  Trouble concentrating 1  1  Moving slowly or fidgety/restless 0  0  Suicidal thoughts 0  0  PHQ-9 Score 5  5  Difficult doing work/chores Somewhat difficult  Not difficult at all    Social History   Tobacco Use   Smoking status: Never    Passive exposure: Never   Smokeless tobacco: Never  Vaping Use   Vaping Use: Never used  Substance Use Topics   Alcohol use: Not Currently    Alcohol/week: 0.0 standard drinks of alcohol   Drug use: No    Review of Systems Per HPI unless specifically indicated above     Objective:    BP 132/88   Pulse 73   Ht _0  (1.549 m)   Wt 287 lb (130.2 kg)   LMP 06/27/2021 (Approximate)   SpO2 99%   BMI 54.23 kg/m   Wt Readings from Last 3 Encounters:  06/05/22 287 lb (130.2 kg)  05/29/22 292 lb (132.5 kg)  04/23/22 290 lb 9.6 oz (131.8 kg)    Physical Exam Vitals and nursing note reviewed.  Constitutional:      General: She is not in acute distress.    Appearance: She is well-developed. She is not diaphoretic.     Comments: Well-appearing, comfortable, cooperative  HENT:     Head: Normocephalic and atraumatic.  Eyes:     General:        Right eye: No discharge.        Left eye: No discharge.     Conjunctiva/sclera: Conjunctivae normal.  Neck:      Thyroid: No thyromegaly.  Cardiovascular:     Rate and Rhythm: Normal rate and regular rhythm.     Heart sounds: Normal heart sounds. No murmur heard. Pulmonary:     Effort: Pulmonary effort is normal. No respiratory distress.     Breath sounds: Wheezing present. No rales.  Musculoskeletal:        General: Normal range of motion.     Cervical back: Normal range of motion and neck supple.  Lymphadenopathy:     Cervical: No cervical adenopathy.  Skin:    General: Skin is warm and dry.     Findings: No erythema or rash.  Neurological:     Mental Status: She is alert and oriented to person, place, and time.  Psychiatric:        Behavior: Behavior normal.     Comments: Well groomed, good eye contact, normal speech and thoughts       Results for orders placed or performed in visit on 04/23/22  Sedimentation rate  Result Value Ref Range  Sed Rate 30 0 - 32 mm/hr  C-reactive protein  Result Value Ref Range   CRP 6 0 - 10 mg/L  CBC with Differential/Platelet  Result Value Ref Range   WBC 10.2 3.4 - 10.8 x10E3/uL   RBC 4.96 3.77 - 5.28 x10E6/uL   Hemoglobin 12.3 11.1 - 15.9 g/dL   Hematocrit 38.6 34.0 - 46.6 %   MCV 78 (L) 79 - 97 fL   MCH 24.8 (L) 26.6 - 33.0 pg   MCHC 31.9 31.5 - 35.7 g/dL   RDW 14.5 11.7 - 15.4 %   Platelets 463 (H) 150 - 450 x10E3/uL   Neutrophils 69 Not Estab. %   Lymphs 23 Not Estab. %   Monocytes 6 Not Estab. %   Eos 2 Not Estab. %   Basos 0 Not Estab. %   Neutrophils Absolute 7.0 1.4 - 7.0 x10E3/uL   Lymphocytes Absolute 2.3 0.7 - 3.1 x10E3/uL   Monocytes Absolute 0.6 0.1 - 0.9 x10E3/uL   EOS (ABSOLUTE) 0.2 0.0 - 0.4 x10E3/uL   Basophils Absolute 0.0 0.0 - 0.2 x10E3/uL   Immature Granulocytes 0 Not Estab. %   Immature Grans (Abs) 0.0 0.0 - 0.1 x10E3/uL  QuantiFERON-TB Gold Plus  Result Value Ref Range   QuantiFERON Incubation Incubation performed.    QuantiFERON Criteria Comment    QuantiFERON TB1 Ag Value 0.00 IU/mL   QuantiFERON TB2  Ag Value 0.00 IU/mL   QuantiFERON Nil Value 0.00 IU/mL   QuantiFERON Mitogen Value 8.69 IU/mL   QuantiFERON-TB Gold Plus Negative Negative  CMP14+EGFR  Result Value Ref Range   Glucose 105 (H) 70 - 99 mg/dL   BUN 11 6 - 20 mg/dL   Creatinine, Ser 0.86 0.57 - 1.00 mg/dL   eGFR 91 >59 mL/min/1.73   BUN/Creatinine Ratio 13 9 - 23   Sodium 138 134 - 144 mmol/L   Potassium 4.1 3.5 - 5.2 mmol/L   Chloride 101 96 - 106 mmol/L   CO2 23 20 - 29 mmol/L   Calcium 9.4 8.7 - 10.2 mg/dL   Total Protein 6.3 6.0 - 8.5 g/dL   Albumin 4.1 3.9 - 4.9 g/dL   Globulin, Total 2.2 1.5 - 4.5 g/dL   Albumin/Globulin Ratio 1.9 1.2 - 2.2   Bilirubin Total 0.3 0.0 - 1.2 mg/dL   Alkaline Phosphatase 100 44 - 121 IU/L   AST 14 0 - 40 IU/L   ALT 11 0 - 32 IU/L      Assessment & Plan:   Problem List Items Addressed This Visit   None Visit Diagnoses     Acute non-recurrent frontal sinusitis    -  Primary   Relevant Medications   amoxicillin-clavulanate (AUGMENTIN) 875-125 MG tablet   predniSONE (DELTASONE) 20 MG tablet   benzonatate (TESSALON) 100 MG capsule   Bronchospasm       Relevant Medications   predniSONE (DELTASONE) 20 MG tablet   albuterol (VENTOLIN HFA) 108 (90 Base) MCG/ACT inhaler   benzonatate (TESSALON) 100 MG capsule      Consistent with acute frontal sinusitis, likely initially viral URI vs allergic rhinitis component with worsening concern for bacterial infection.   Plan: Start Augmentin 875-154m PO BID x 10 days Start Prednisone taper Start Tessalon Perls take 1 capsule up to 3 times a day as needed for cough Albuterol inhaler AS NEEDED  Return criteria reviewed   Meds ordered this encounter  Medications   amoxicillin-clavulanate (AUGMENTIN) 875-125 MG tablet    Sig: Take 1 tablet by  mouth 2 (two) times daily.    Dispense:  20 tablet    Refill:  0   predniSONE (DELTASONE) 20 MG tablet    Sig: Take daily with food. Start with 69m (3 pills) x 2 days, then reduce to 428m (2 pills) x 2 days, then 2023m1 pill) x 3 days    Dispense:  13 tablet    Refill:  0   albuterol (VENTOLIN HFA) 108 (90 Base) MCG/ACT inhaler    Sig: Inhale 2 puffs into the lungs every 6 (six) hours as needed for wheezing or shortness of breath.    Dispense:  8 g    Refill:  2   benzonatate (TESSALON) 100 MG capsule    Sig: Take 1 capsule (100 mg total) by mouth 3 (three) times daily as needed for cough.    Dispense:  30 capsule    Refill:  0      Follow up plan: Return if symptoms worsen or fail to improve.   AleNobie PutnamO [facility] 06/05/2022, 4:29 PM

## 2022-06-06 ENCOUNTER — Other Ambulatory Visit: Payer: Self-pay

## 2022-06-13 ENCOUNTER — Other Ambulatory Visit: Payer: Self-pay

## 2022-06-21 ENCOUNTER — Encounter: Payer: Self-pay | Admitting: Internal Medicine

## 2022-06-25 ENCOUNTER — Ambulatory Visit: Payer: Managed Care, Other (non HMO) | Admitting: Family Medicine

## 2022-07-03 ENCOUNTER — Other Ambulatory Visit (HOSPITAL_COMMUNITY): Payer: Self-pay

## 2022-07-03 NOTE — Telephone Encounter (Signed)
Found patient's correct processing info for [employer] ins- patient must fill through [place]. Also appears patient has a secondary Medicaid ins. Both ins have been added in [place].

## 2022-07-04 NOTE — Telephone Encounter (Signed)
ATC patient to schedule Taltz new start. Unable to leave VM as VM box is full. MyChart message sent to patient  Knox Saliva, PharmD, MPH, BCPS, CPP Clinical Pharmacist (Rheumatology and Pulmonology)

## 2022-07-06 ENCOUNTER — Ambulatory Visit: Payer: Managed Care, Other (non HMO) | Admitting: Family Medicine

## 2022-07-09 ENCOUNTER — Ambulatory Visit: Payer: Managed Care, Other (non HMO) | Admitting: Family Medicine

## 2022-07-10 ENCOUNTER — Ambulatory Visit: Payer: Managed Care, Other (non HMO) | Admitting: Family Medicine

## 2022-07-10 ENCOUNTER — Other Ambulatory Visit (HOSPITAL_COMMUNITY): Payer: Self-pay

## 2022-07-10 NOTE — Telephone Encounter (Signed)
Patient scheduled for Taltz new start on 07/19/2022. Will use sample  Knox Saliva, PharmD, MPH, BCPS, CPP Clinical Pharmacist (Rheumatology and Pulmonology)

## 2022-07-10 NOTE — Telephone Encounter (Signed)
Spoke with patient regarding Leah Osborne new start. She states she was not aware that she had a Medicaid plan.  Submitted a Prior Authorization request to [facility] for TALTZ via CoverMyMeds. Will update once we receive a response.  Key: [id number]  Knox Saliva, PharmD, MPH, BCPS, CPP Clinical Pharmacist (Rheumatology and Pulmonology)

## 2022-07-19 ENCOUNTER — Other Ambulatory Visit (HOSPITAL_COMMUNITY): Payer: Self-pay

## 2022-07-19 ENCOUNTER — Ambulatory Visit: Payer: Managed Care, Other (non HMO) | Attending: Internal Medicine | Admitting: Pharmacist

## 2022-07-19 DIAGNOSIS — L400 Psoriasis vulgaris: Secondary | ICD-10-CM

## 2022-07-19 DIAGNOSIS — Z7189 Other specified counseling: Secondary | ICD-10-CM

## 2022-07-19 DIAGNOSIS — M450 Ankylosing spondylitis of multiple sites in spine: Secondary | ICD-10-CM

## 2022-07-19 MED ORDER — TALTZ 80 MG/ML ~~LOC~~ SOAJ: 80.0000 mg | SUBCUTANEOUS | 1 refills | Status: DC

## 2022-07-19 MED ORDER — TALTZ 80 MG/ML ~~LOC~~ SOAJ: SUBCUTANEOUS | 2 refills | Status: DC

## 2022-07-19 NOTE — Patient Instructions (Addendum)
Your next TALTZ dose is due on 08/02/22, 08/16/22, 08/30/22, 09/13/22, 09/27/22, 10/11/22, then every 4 weeks thereafter (starting on 11/08/22)  HOLD TALTZ if you have signs or symptoms of an infection. You can resume once you feel better or back to your baseline. HOLD TALTZ if you start antibiotics to treat an infection. HOLD TALTZ around the time of surgery/procedures. Your surgeon will be able to provide recommendations on when to hold BEFORE and when you are cleared to [place].  Pharmacy information: Your prescription will be shipped from [facility]. Their phone number is [phone] Please call to schedule shipment and confirm address. They will mail your medication to your home.  Labs are due in 1 month then every 3 months. Lab hours are from Monday to Thursday 8am-12:30pm and 1pm-5pm and Friday 8am-12pm. You do not need an appointment if you come for labs during these times.  How to manage an injection site reaction: Remember the 5 C's: COUNTER - leave on the counter at least 30 minutes but up to overnight to bring medication to room temperature. This may help prevent stinging COLD - place something cold (like an ice gel pack or cold water bottle) on the injection site just before cleansing with alcohol. This may help reduce pain CLARITIN - use Claritin (generic name is loratadine) for the first two weeks of treatment or the day of, the day before, and the day after injecting. This will help to minimize injection site reactions CORTISONE CREAM - apply if injection site is irritated and itching CALL ME - if injection site reaction is bigger than the size of your fist, looks infected, blisters, or if you develop hives

## 2022-07-19 NOTE — Telephone Encounter (Signed)
Patient had Donnetta Hail new start today and stated she spoke with Medicaid to coordinate benefits and make Medicaid her secondary. I tried to run a test claim but it is not processing with a rejection stating there is another primary payer. Patient will be   I received a denial from Medicaid for her [place]. Denied due to TB and hepatitis labs that were missing. Patient signed appeal form today. Appeal faxed to [facility] as EXPEDITED  PA # [id number] Fax: [phone] Phone: [phone]  Knox Saliva, PharmD, MPH, BCPS, CPP Clinical Pharmacist (Rheumatology and Pulmonology)

## 2022-07-19 NOTE — Progress Notes (Signed)
Pharmacy Note  Subjective:   Patient presents to clinic today to receive first dose of Taltz for ankylosing spondylitis and psoriasis. She has taken Humira in the past (primary non-responder). Since I talked to her a couple of weeks ago and made her aware, she said she called Medicaid to coordinate her benefits  Patient running a fever or have signs/symptoms of infection? No  Patient currently on antibiotics for the treatment of infection? No  Patient have any upcoming invasive procedures/surgeries? No  Objective: CMP     Component Value Date/Time   NA 138 04/25/2022 1359   NA 140 07/19/2011 2312   K 4.1 04/25/2022 1359   K 4.3 07/19/2011 2312   CL 101 04/25/2022 1359   CL 105 07/19/2011 2312   CO2 23 04/25/2022 1359   CO2 27 07/19/2011 2312   GLUCOSE 105 (H) 04/25/2022 1359   GLUCOSE 95 10/31/2021 1128   GLUCOSE 84 07/19/2011 2312   BUN 11 04/25/2022 1359   BUN 7 07/19/2011 2312   CREATININE 0.86 04/25/2022 1359   CREATININE 0.88 01/28/2019 1012   CALCIUM 9.4 04/25/2022 1359   CALCIUM 9.6 07/19/2011 2312   PROT 6.3 04/25/2022 1359   PROT 7.0 07/19/2011 2312   ALBUMIN 4.1 04/25/2022 1359   ALBUMIN 3.2 (L) 07/19/2011 2312   AST 14 04/25/2022 1359   AST 19 07/19/2011 2312   ALT 11 04/25/2022 1359   ALT 29 07/19/2011 2312   ALKPHOS 100 04/25/2022 1359   ALKPHOS 63 07/19/2011 2312   BILITOT 0.3 04/25/2022 1359   BILITOT 0.2 07/19/2011 2312   GFRNONAA >60 10/31/2021 1128   GFRNONAA 88 01/28/2019 1012   GFRAA 117 02/19/2019 1400   GFRAA 102 01/28/2019 1012    CBC    Component Value Date/Time   WBC 10.2 04/25/2022 1359   WBC 8.2 10/31/2021 1128   RBC 4.96 04/25/2022 1359   RBC 4.95 10/31/2021 1128   HGB 12.3 04/25/2022 1359   HCT 38.6 04/25/2022 1359   PLT 463 (H) 04/25/2022 1359   MCV 78 (L) 04/25/2022 1359   MCV 86 07/19/2011 2312   MCH 24.8 (L) 04/25/2022 1359   MCH 25.1 (L) 10/31/2021 1128   MCHC 31.9 04/25/2022 1359   MCHC 30.9 10/31/2021 1128   RDW  14.5 04/25/2022 1359   RDW 13.3 07/19/2011 2312   LYMPHSABS 2.3 04/25/2022 1359   LYMPHSABS 2.8 07/19/2011 2312   MONOABS 0.7 10/31/2021 1128   MONOABS 1.1 (H) 07/19/2011 2312   EOSABS 0.2 04/25/2022 1359   EOSABS 0.1 07/19/2011 2312   BASOSABS 0.0 04/25/2022 1359   BASOSABS 0.1 07/19/2011 2312    Baseline Immunosuppressant Therapy Labs TB GOLD    Latest Ref Rng & Units 04/25/2022    1:59 PM  Quantiferon TB Gold  Quantiferon TB Gold Plus Negative Negative    Hepatitis Panel   HIV Lab Results  Component Value Date   HIV Non Reactive 01/02/2019   HIV NON REACTIVE 09/14/2015   Immunoglobulins   SPEP    Latest Ref Rng & Units 04/25/2022    1:59 PM  Serum Protein Electrophoresis  Total Protein 6.0 - 8.5 g/dL 6.3    G6PD No results found for: "G6PDH" TPMT No results found for: "TPMT"   Chest x-ray: 03/24/2018 - No active cardiopulmonary disease.  Assessment/Plan:  Counseled patient that Donnetta Hail is a IL-17 inhibitor that works to reduce pain and inflammation associated with arthritis.  Counseled patient on purpose, proper use, and adverse effects of Taltz. Reviewed the  most common adverse effects of infection (more commonly nasopharyngitis, URTI), inflammatory bowel disease, and allergic reaction. Counseled patient that Donnetta Hail should be held for infection and prior to scheduled surgery.  Counseled patient to avoid live vaccines while on Taltz. Recommend annual influenza, PCV 15 or PCV20 or Pneumovax 23, and Shingrix as indicated. Reviewed storage information for Taltz.  Reviewed the importance of regular labs while on [place]. Will monitor CBC and CMP 1 month after starting and every 3 months routinely thereafter. Will monitor TB gold annually. Standing orders placed. Provided patient with medication education material and answered all questions.  Patient consented to [place].  Will upload consent into patient's chart.  Patient voiced understanding.    Reviewed importance of holding  TALTZ with signs/symptoms of an infections, if antibiotics are prescribed to treat an active infection, and with invasive procedures  Demonstrated proper injection technique with TALTZ pen demo device  Patient able to demonstrate proper injection technique using the teach back method.  Patient self injected in the right lower abdomen and left lower abdomen with:  Sample Medication: TALTZ '80mg'$ /ml pen (right lower abdomen) Lot: [id number] Expiration: 06/20/2023  Sample Medication: TALTZ '80mg'$ /ml pen (left lower abdomen) Lot: [id number] Expiration: 02/15/2023  Patient tolerated well.  Observed for 30 mins in office for adverse reaction and none noted.   Patient is to return in 1 month for labs and 6-8 weeks for follow-up appointment.  Standing orders placed.   TALTZ approved through insurance .   Rx sent to: [facility]: [phone] .  Patient provided with pharmacy phone number and advised to call later this week to schedule shipment to home.  Patient will continue Taltz '160mg'$  SQ at Week 0 (completed in clinic today), then '80mg'$  SQ at week 2, 3, 4, 6, 8, 10, 12 then every 4 weeks thereafter. Dose due dates are 08/02/22, 08/16/22, 08/30/22, 09/13/22, 09/27/22, 10/11/22, then every 4 weeks thereafter (starting on 11/08/22)  All questions encouraged and answered.  Instructed patient to call with any further questions or concerns.  Knox Saliva, PharmD, MPH, BCPS, CPP Clinical Pharmacist (Rheumatology and Pulmonology)  07/19/2022 10:46 AM

## 2022-07-20 NOTE — Telephone Encounter (Signed)
Received fax from [facility] stating denial for Leah Osborne has been OVERTURNED. Leah Osborne has been approved from 07/10/2022 to 07/20/2022  PA # [id number]  Knox Saliva, PharmD, MPH, BCPS, CPP Clinical Pharmacist (Rheumatology and Pulmonology)

## 2022-07-20 NOTE — Telephone Encounter (Signed)
Received fax from [facility] requesting all labs even though they were already attached. Resent appeal packet  Knox Saliva, PharmD, MPH, BCPS, CPP Clinical Pharmacist (Rheumatology and Pulmonology)

## 2022-07-20 NOTE — Telephone Encounter (Signed)
Apparentely received call from [facility] this morning regarding clinicals. [place] Caul that these were refaxed. Nevertheless called [facility] back and they were unable to located patient after 45 min on off. Will await f/u from [facility] because patient is obviously active if we received communication just this morning  Knox Saliva, PharmD, MPH, BCPS, CPP Clinical Pharmacist (Rheumatology and Pulmonology)

## 2022-07-27 ENCOUNTER — Ambulatory Visit: Payer: Managed Care, Other (non HMO) | Admitting: Family Medicine

## 2022-08-01 ENCOUNTER — Ambulatory Visit (INDEPENDENT_AMBULATORY_CARE_PROVIDER_SITE_OTHER): Payer: Managed Care, Other (non HMO) | Admitting: Family Medicine

## 2022-08-01 ENCOUNTER — Encounter: Payer: Self-pay | Admitting: Family Medicine

## 2022-08-01 VITALS — BP 132/86 | HR 78 | Ht 61.0 in | Wt 281.0 lb

## 2022-08-01 DIAGNOSIS — R7309 Other abnormal glucose: Secondary | ICD-10-CM

## 2022-08-01 DIAGNOSIS — M450 Ankylosing spondylitis of multiple sites in spine: Secondary | ICD-10-CM

## 2022-08-01 DIAGNOSIS — E559 Vitamin D deficiency, unspecified: Secondary | ICD-10-CM

## 2022-08-01 DIAGNOSIS — E039 Hypothyroidism, unspecified: Secondary | ICD-10-CM

## 2022-08-01 DIAGNOSIS — F314 Bipolar disorder, current episode depressed, severe, without psychotic features: Secondary | ICD-10-CM

## 2022-08-01 DIAGNOSIS — M461 Sacroiliitis, not elsewhere classified: Secondary | ICD-10-CM

## 2022-08-01 DIAGNOSIS — D500 Iron deficiency anemia secondary to blood loss (chronic): Secondary | ICD-10-CM

## 2022-08-01 LAB — POCT GLYCOSYLATED HEMOGLOBIN (HGB A1C): Hemoglobin A1C: 6.4 % — AB (ref 4.0–5.6)

## 2022-08-01 NOTE — Progress Notes (Signed)
Subjective:    Patient ID: Leah Osborne, female    DOB: [birth year], 37 y.o.   MRN: [id number]  Leah Osborne is a 37 y.o. female presenting on 08/01/2022 for Hypothyroidism and Prediabetes   HPI  Morbid obesity BMI >53 Elevated A1c  She has not been on any medication for weight management since not available, previously issue with covered [facility] but unavailable. Weight loss 10-11 lbs in past 2 months with lifestyle intervention Prior A1c 6.2, now up to 6.4  Metformin XR failed 2019  Has had gastric emptying study that was negative. No gastroparesis Also will have esophageal manometry, pending, maybe EGD  Her plan is to follow with [facility] and pursue weight loss surgery as indicated.   Bipolar Behavioral Health tapering off Lamotrigine now, from 150 down to lower dose now at 81m, and she was [place] to [place](determined to be no significant gain or loss on this med)  Inflammatory Arthritis / Ankylosing Spondylitis Followed by Rheumatology On Taltz therapy now.  Thyroid Anemia labs Due today       08/01/2022    9:08 AM 12/25/2021    8:56 AM 08/22/2021   11:03 AM  Depression screen PHQ 2/9  Decreased Interest 0 1 0  Down, Depressed, Hopeless 0 0 0  PHQ - 2 Score 0 1 0  Altered sleeping 0 0   Tired, decreased energy 2 2   Change in appetite 1 1   Feeling bad or failure about yourself  0 0   Trouble concentrating 0 1   Moving slowly or fidgety/restless 2 0   Suicidal thoughts 0 0   PHQ-9 Score 5 5   Difficult doing work/chores Somewhat difficult Somewhat difficult     Social History   Tobacco Use   Smoking status: Never    Passive exposure: Never   Smokeless tobacco: Never  Vaping Use   Vaping Use: Never used  Substance Use Topics   Alcohol use: Not Currently    Alcohol/week: 0.0 standard drinks of alcohol   Drug use: No    Review of Systems Per HPI unless specifically indicated above     Objective:    BP 132/86   Pulse  78   Ht 5' 1"$  (1.549 m)   Wt 281 lb (127.5 kg)   LMP 06/27/2021 (Approximate)   SpO2 98%   BMI 53.09 kg/m   Wt Readings from Last 3 Encounters:  08/01/22 281 lb (127.5 kg)  06/05/22 287 lb (130.2 kg)  05/29/22 292 lb (132.5 kg)    Physical Exam Vitals and nursing note reviewed.  Constitutional:      General: She is not in acute distress.    Appearance: Normal appearance. She is well-developed. She is obese. She is not diaphoretic.     Comments: Well-appearing, comfortable, cooperative  HENT:     Head: Normocephalic and atraumatic.  Eyes:     General:        Right eye: No discharge.        Left eye: No discharge.     Conjunctiva/sclera: Conjunctivae normal.  Cardiovascular:     Rate and Rhythm: Normal rate.  Pulmonary:     Effort: Pulmonary effort is normal.  Skin:    General: Skin is warm and dry.     Findings: No erythema or rash.  Neurological:     Mental Status: She is alert and oriented to person, place, and time.  Psychiatric:        Mood and  Affect: Mood normal.        Behavior: Behavior normal.        Thought Content: Thought content normal.     Comments: Well groomed, good eye contact, normal speech and thoughts       Results for orders placed or performed in visit on 08/01/22  POCT HgB A1C  Result Value Ref Range   Hemoglobin A1C 6.4 (A) 4.0 - 5.6 %      Assessment & Plan:   Problem List Items Addressed This Visit     Anemia, iron deficiency   Relevant Orders   CBC with Differential/Platelet   Iron, TIBC and Ferritin Panel   Elevated hemoglobin A1c - Primary   Relevant Orders   POCT HgB A1C (Completed)   Hypothyroidism   Relevant Orders   TSH   T4, free   Morbid (severe) obesity due to excess calories ([place])   Vitamin D deficiency   Relevant Orders   VITAMIN D 25 Hydroxy (Vit-D Deficiency, Fractures)    Morbid Obesity BMI >53 Already established with [place] She is currently undergoing testing and evaluation,  future consideration of surgery  Previously attempted to get her on GLP therapy for weight management Difficulty obtaining Wegovy despite approval, unavailable at pharmacy in past Saxenda not available/covered  Her prior A1c 6.2, now today with elevation to 6.4 No prior diagnosis of Type 2 Diabetes  Will trial off label use sample Ozempic today for 6 week course, since she was approved for [facility] but we could not locate it at the pharmacy. She can try searching for higher doses 1 mg or 1.7 or 2.28m and if identify a higher dose available, we can try to order.  Her goal is to pursue the bariatric surgery plan for longer term results.  Followed by Rheumatology and has had anemia On new medication with Taltz per Rheumatology for inflammatory arthritis  Bipolar / mood On caplyta now  Labs today repeat Thyroid, Vitamin D and Anemia panel    No orders of the defined types were placed in this encounter.     Follow up plan: Return in about 7 months (around 03/02/2023) for 7 month Annual Physical AM apt lab after.   ANobie Putnam D[facility] 08/01/2022, 8:58 AM

## 2022-08-01 NOTE — Patient Instructions (Addendum)
Thank you for coming to the office today.  Call insurance find cost and coverage of the following - check the following: - Drug Tier, Preferred List, On Formulary - All will require a "Prior Authorization" from [place] first, before you can find out the cost - Find out if there is "Step Therapy" (other medicines required before you can try these)  Once you pick the one you want to try, let me know - we can get a sample ready IF we have it in stock. Then try it - and before running out of medicine, contact me back to order your Rx so we have time to get it processed.  Future make sure your insurance has weight loss coverage  Check with insurance on Zepbound (weekly, injection)  Check with other pharmacies for [facility] dosages Higher dose more likely to be available.  Wegovy 0.5 (was unavailable previously) Wegovy 26m Wegovy 1.7 mg Wegovy 2.4 mg  If you can locate one of these available, we can submit an order and try again for the approval.  A1c 6.4 is close to Diabetic territory, but prefer to keep improving and focus on the future goal of bariatric surgery.  Recent Labs    08/18/21 1710 01/24/22 0000 08/01/22 0900  HGBA1C 5.8* 6.2 6.4*     Please schedule a Follow-up Appointment to: Return in about 7 months (around 03/02/2023) for 7 month Annual Physical AM apt lab after.  If you have any other questions or concerns, please feel free to call the office or send a message through [place] You may also schedule an earlier appointment if necessary.  Additionally, you may be receiving a survey about your experience at our office within a few days to 1 week by e-mail or mail. We value your feedback.  ANobie Putnam DO [place]

## 2022-08-02 DIAGNOSIS — F4320 Adjustment disorder, unspecified: Secondary | ICD-10-CM | POA: Diagnosis not present

## 2022-08-02 DIAGNOSIS — Z903 Acquired absence of stomach [part of]: Secondary | ICD-10-CM | POA: Diagnosis not present

## 2022-08-02 LAB — CBC WITH DIFFERENTIAL/PLATELET
Absolute Monocytes: 630 cells/uL (ref 200–950)
Basophils Absolute: 60 cells/uL (ref 0–200)
Basophils Relative: 0.8 %
Eosinophils Absolute: 180 cells/uL (ref 15–500)
Eosinophils Relative: 2.4 %
HCT: 38.6 % (ref 35.0–45.0)
Hemoglobin: 12.4 g/dL (ref 11.7–15.5)
Lymphs Abs: 2003 cells/uL (ref 850–3900)
MCH: 25.9 pg — ABNORMAL LOW (ref 27.0–33.0)
MCHC: 32.1 g/dL (ref 32.0–36.0)
MCV: 80.6 fL (ref 80.0–100.0)
MPV: 9.2 fL (ref 7.5–12.5)
Monocytes Relative: 8.4 %
Neutro Abs: 4628 cells/uL (ref 1500–7800)
Neutrophils Relative %: 61.7 %
Platelets: 412 10*3/uL — ABNORMAL HIGH (ref 140–400)
RBC: 4.79 10*6/uL (ref 3.80–5.10)
RDW: 15 % (ref 11.0–15.0)
Total Lymphocyte: 26.7 %
WBC: 7.5 10*3/uL (ref 3.8–10.8)

## 2022-08-02 LAB — VITAMIN D 25 HYDROXY (VIT D DEFICIENCY, FRACTURES): Vit D, 25-Hydroxy: 41 ng/mL (ref 30–100)

## 2022-08-02 LAB — IRON,TIBC AND FERRITIN PANEL
%SAT: 9 % (calc) — ABNORMAL LOW (ref 16–45)
Ferritin: 8 ng/mL — ABNORMAL LOW (ref 16–154)
Iron: 32 ug/dL — ABNORMAL LOW (ref 40–190)
TIBC: 358 mcg/dL (calc) (ref 250–450)

## 2022-08-02 LAB — TSH: TSH: 2.25 mIU/L

## 2022-08-02 LAB — T4, FREE: Free T4: 1 ng/dL (ref 0.8–1.8)

## 2022-08-04 ENCOUNTER — Other Ambulatory Visit: Payer: Self-pay | Admitting: Internal Medicine

## 2022-08-07 ENCOUNTER — Ambulatory Visit: Payer: Self-pay | Admitting: *Deleted

## 2022-08-07 NOTE — Telephone Encounter (Signed)
  Chief Complaint: flu B exposure- request testing Symptoms: Cold chills, nausea, fatigue, low grade temp, scratchy throat  Frequency: home exposure- symptoms today  Disposition: []$ ED /[]$ Urgent Care (no appt availability in office) / [x]$ Appointment(In office/virtual)/ []$  [place] [facility]/ []$ Home Care/ []$ Refused Recommended Disposition /[]$ [facility]/ []$  Follow-up with PCP Additional Notes: Patient is requesting testing- appointment scheduled

## 2022-08-07 NOTE — Telephone Encounter (Signed)
Summary: fever 99.5, has cold chills, nausea, and feels tired.   After I finished the call with pt I thought about her answer when, I asked if she was having SOB pt stated no more than usual.  ----- Message from [facility] sent at 08/07/2022  1:58 PM EST ----- Pt stated she is currently at work, running a fever 99.5, has cold chills, nausea, and feels tired. She stated that her husband tested positive for the flu yesterday.  Pt is requesting an appointment to come in today to be tested.  No appointments are available today.   Pt seeking clinical advice.         Reason for Disposition  Patient is HIGH RISK (e.g., age > 80 years, pregnant, HIV+, or chronic medical condition)  Answer Assessment - Initial Assessment Questions 1. TYPE of EXPOSURE: "How were you exposed?" (e.g., close contact, not a close contact)     Home exposure 2. DATE of EXPOSURE: "When did the exposure occur?" (e.g., hour, days, weeks)     Husband tested + flu B yesterday 3. PREGNANCY: "Is there any chance you are pregnant?" "When was your last menstrual period?"    n/a 4. HIGH RISK for COMPLICATIONS: "Do you have any heart or lung problems?" "Do you have a weakened immune system?" (e.g., CHF, COPD, asthma, HIV positive, chemotherapy, renal failure, diabetes mellitus, sickle cell anemia)    Hx CVA 5. SYMPTOMS: "Do you have any symptoms?" (e.g., cough, fever, sore throat, difficulty breathing).     Cold chills, nausea, fatigue, low grade temp, scratchy throat  Protocols used: Influenza (Flu) [place], Influenza (Flu) - Seasonal-A-AH

## 2022-08-08 ENCOUNTER — Encounter: Payer: Self-pay | Admitting: Family Medicine

## 2022-08-08 ENCOUNTER — Ambulatory Visit (INDEPENDENT_AMBULATORY_CARE_PROVIDER_SITE_OTHER): Payer: Managed Care, Other (non HMO) | Admitting: Family Medicine

## 2022-08-08 VITALS — BP 122/75 | HR 93

## 2022-08-08 DIAGNOSIS — R0981 Nasal congestion: Secondary | ICD-10-CM

## 2022-08-08 DIAGNOSIS — J101 Influenza due to other identified influenza virus with other respiratory manifestations: Secondary | ICD-10-CM

## 2022-08-08 DIAGNOSIS — R110 Nausea: Secondary | ICD-10-CM | POA: Diagnosis not present

## 2022-08-08 DIAGNOSIS — R051 Acute cough: Secondary | ICD-10-CM

## 2022-08-08 LAB — POC COVID19 BINAXNOW: SARS Coronavirus 2 Ag: NEGATIVE

## 2022-08-08 LAB — POC INFLUENZA A&B (BINAX/QUICKVUE)
Influenza A, POC: NEGATIVE
Influenza B, POC: NEGATIVE

## 2022-08-08 MED ORDER — ONDANSETRON 4 MG PO TBDP: 4.0000 mg | ORAL_TABLET | Freq: Three times a day (TID) | ORAL | 0 refills | Status: DC | PRN

## 2022-08-08 MED ORDER — OSELTAMIVIR PHOSPHATE 75 MG PO CAPS: 75.0000 mg | ORAL_CAPSULE | Freq: Two times a day (BID) | ORAL | 0 refills | Status: DC

## 2022-08-08 NOTE — Progress Notes (Signed)
Subjective:    Patient ID: Leah Osborne, female    DOB: [birth year], 37 y.o.   MRN: [id number]  Leah Osborne is a 37 y.o. female presenting on 08/08/2022 for Cough and Nasal Congestion  Patient presents for a same day appointment.   HPI  Influenza B Suspected  Exposure to Influenza B at home Reports symptoms started yesterday. 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, started to have Flu symptoms Today has worsening flu like viral symptoms Husband with Flu B positive now on treatment Admits headache Admits chills, aches, cough, congestion Admits nausea  Flu Shot in 02/2022     08/01/2022    9:08 AM 12/25/2021    8:56 AM 08/22/2021   11:03 AM  Depression screen PHQ 2/9  Decreased Interest 0 1 0  Down, Depressed, Hopeless 0 0 0  PHQ - 2 Score 0 1 0  Altered sleeping 0 0   Tired, decreased energy 2 2   Change in appetite 1 1   Feeling bad or failure about yourself  0 0   Trouble concentrating 0 1   Moving slowly or fidgety/restless 2 0   Suicidal thoughts 0 0   PHQ-9 Score 5 5   Difficult doing work/chores Somewhat difficult Somewhat difficult     Social History   Tobacco Use   Smoking status: Never    Passive exposure: Never   Smokeless tobacco: Never  Vaping Use   Vaping Use: Never used  Substance Use Topics   Alcohol use: Not Currently    Alcohol/week: 0.0 standard drinks of alcohol   Drug use: No    Review of Systems Per HPI unless specifically indicated above     Objective:    BP 122/75   Pulse 93   LMP 06/27/2021 (Approximate)   SpO2 99%   Wt Readings from Last 3 Encounters:  08/01/22 281 lb (127.5 kg)  06/05/22 287 lb (130.2 kg)  05/29/22 292 lb (132.5 kg)    Physical Exam Vitals and nursing note reviewed.  Constitutional:      General: She is not in acute distress.    Appearance: Normal appearance. She is well-developed. She is not diaphoretic.     Comments: Well-appearing, comfortable, cooperative  HENT:     Head: Normocephalic and  atraumatic.  Eyes:     General:        Right eye: No discharge.        Left eye: No discharge.     Conjunctiva/sclera: Conjunctivae normal.  Cardiovascular:     Rate and Rhythm: Normal rate.  Pulmonary:     Effort: Pulmonary effort is normal.  Skin:    General: Skin is warm and dry.     Findings: No erythema or rash.  Neurological:     Mental Status: She is alert and oriented to person, place, and time.  Psychiatric:        Mood and Affect: Mood normal.        Behavior: Behavior normal.        Thought Content: Thought content normal.     Comments: Well groomed, good eye contact, normal speech and thoughts      Results for orders placed or performed in visit on 08/08/22  POC Influenza A&B (Binax test)  Result Value Ref Range   Influenza A, POC Negative Negative   Influenza B, POC Negative Negative  POC COVID-19  Result Value Ref Range   SARS Coronavirus 2 Ag Negative Negative  Assessment & Plan:   Problem List Items Addressed This Visit   None Visit Diagnoses     Influenza B    -  Primary   Relevant Medications   oseltamivir (TAMIFLU) 75 MG capsule   Acute cough       Relevant Orders   POC Influenza A&B (Binax test) (Completed)   POC COVID-19 (Completed)   Nasal congestion       Relevant Orders   POC Influenza A&B (Binax test) (Completed)   POC COVID-19 (Completed)   Nausea       Relevant Medications   ondansetron (ZOFRAN-ODT) 4 MG disintegrating tablet        Clinically diagnosed influenza despite negative rapid flu test today, concern for flu still due to significant known exposure to positive influenza B - Duration x 1 day, without complication. Tolerating PO and well hydrated - No other focal findings of infection today - S/p influenza vaccine this season  Plan: 1. Start Tamiflu 105m capsules BID x 5 days 2. Supportive care as advised with NSAID / Tylenol PRN fever/myalgias, improve hydration, may take OTC Cold/Flu meds 3. Zofran ODT PRN  nausea/vomiting 4. Return criteria given if significant worsening, consider post-influenza complications, otherwise follow-up if needed   Meds ordered this encounter  Medications   oseltamivir (TAMIFLU) 75 MG capsule    Sig: Take 1 capsule (75 mg total) by mouth 2 (two) times daily. For 5 days    Dispense:  10 capsule    Refill:  0   ondansetron (ZOFRAN-ODT) 4 MG disintegrating tablet    Sig: Take 1 tablet (4 mg total) by mouth every 8 (eight) hours as needed for nausea or vomiting.    Dispense:  20 tablet    Refill:  0      Follow up plan: Return if symptoms worsen or fail to improve.  ANobie Putnam DO[facility]Medical [facility] 08/08/2022, 11:13 AM

## 2022-08-08 NOTE — Patient Instructions (Addendum)
Thank you for coming to the office today.  Your flu test was NEGATIVE, this is not 100% though, and you can still have the flu with a negative test, otherwise it could be a different viral syndrome.  - Start Flu medicine today Tamiflu (anti-flu medicine) take one capsule 10m twice a day   - For symptom control (these are optional OTC medicines)      - Take Ibuprofen / Advil 400-6079mevery 6-8 hours as needed for fever / muscle aches, and may also take Tylenol [id number] dose every 6-8 hours or 3 times a day, can alternate dosing     - May try OTC Mucinex up to 7-10 days then stop  - Wash hands and cover cough very well to avoid spread of infection - Improve hydration with plenty of clear fluids    If significant worsening with poor fluid intake, worsening fever, difficulty breathing due to coughing, worsening body aches, weakness, or other more concerning symptoms difficulty breathing you can seek treatment at Emergency Department. Also if improved flu symptoms and then worsening days to week later with concerns for bronchitis, productive cough fever chills again we may need to check for possible pneumonia that can occur after the flu   Please schedule a Follow-up Appointment to: Return if symptoms worsen or fail to improve.  If you have any other questions or concerns, please feel free to call the office or send a message through [place] may also schedule an earlier appointment if necessary.  Additionally, you may be receiving a survey about your experience at our office within a few days to 1 week by e-mail or mail. We value your feedback.  AleNobie PutnamO SouLa Porte [place]

## 2022-08-13 ENCOUNTER — Encounter: Payer: Self-pay | Admitting: Family Medicine

## 2022-08-13 DIAGNOSIS — B379 Candidiasis, unspecified: Secondary | ICD-10-CM

## 2022-08-13 MED ORDER — FLUCONAZOLE 150 MG PO TABS: ORAL_TABLET | ORAL | 0 refills | Status: DC

## 2022-08-23 ENCOUNTER — Encounter: Payer: Self-pay | Admitting: Family Medicine

## 2022-08-24 ENCOUNTER — Encounter: Payer: Self-pay | Admitting: Internal Medicine

## 2022-08-24 MED ORDER — WEGOVY 1 MG/0.5ML ~~LOC~~ SOAJ: 1.0000 mg | SUBCUTANEOUS | 0 refills | Status: DC

## 2022-08-27 ENCOUNTER — Telehealth: Payer: Self-pay

## 2022-08-27 NOTE — Telephone Encounter (Signed)
[facility] contacted the office to state the patient's Leah Osborne was shipped with the directions to inject on week 8 and week 10. The patient is to do the injection at week 4 and week 6. [name] at [facility] stated they have contacted the patient to correct the instructions. [name] states the patient verbalized understanding.

## 2022-08-27 NOTE — Progress Notes (Signed)
Office Visit Note  Patient: Leah Osborne             Date of Birth: 1986-03-12           MRN: [id number]             PCP: Olin Hauser, DO Referring: Nobie Putnam * Visit Date: 08/28/2022   Subjective:  Pain of the Left Hip, Pain of the Right Hip, Pain of the Lower Back, and Follow-up (Skin is improving)   History of Present Illness: Leah Osborne is a 37 y.o. 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 Due to initial delay with medication approval she started February 1st so only on treatment about 6 weeks. So far she notices partial improvement in skin but no difference in joint pain so far. She also had influenza B infection last month that was very painful and draining, and also felt terrible on the tamiflu treatment for it. Still has persistent low back pain and bilateral hip pain.  Previous HPI 05/29/22 Leah Osborne is a 37 y.o. 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 Updated xrays at initial visit demonstrate bilateral SI joint changes but not specific for inflammatory process. Labs were negative for increased inflammatory markers. Normal baseline screening labs for DMARD treatment. She has not seen any major difference with flexeril for her night and morning back pain. Facial rosacea rash is a bit worse than a month ago.   Previous HPI 04/23/22 Leah Osborne is a 37 y.o. 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  Back pain mostly became a significant problem around 2017 following a pregnancy at the time.  Back pain and stiffness frequently with nighttime awakenings and symptoms most prominent in the mornings concerning for inflammatory arthritis.  Previously seen in [facility]ology but not following up since 2020. She was previously treated with prescription NSAIDs and did  physical therapy for back in 2018.  She had local steroid injections to the SI joints but does not recall an impressive amount of symptom benefit from this.  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  She never saw a large improvement in symptoms with the Humira but only ever continue this for a few months before interrupting.  Short-term treatments with prednisone for various indications along the way have not usually caused a dramatic change in back pain severity.  She is not on any muscle relaxer type medication long-term, has taken this for neck pain issues as well as prednisone and was not sure if this had a large impact in her back pain.  She takes mainly Tylenol arthritis as needed for pain which is mildly helpful. She also sees dermatology for problems including rosacea and psoriasis previously prescribed just topical medication.  She has had psoriasis for many years mostly rashes on the scalp and arms this has never been a severe problem is currently pretty well controlled on the topical medication.   Labs reivewed 12/2018 HBV neg  03/2018  ANA neg dsDNA neg CCP neg ESR 19 CRP 16.8   07/2016 HLA-B27 neg HCV neg   Imaging reviewed 12/31/17 MRI PELVIS W WO CONTRAST MSK IMPRESSION:  Findings most consistent with chronic nonactive sacroiliitis, similar to the prior exam. No evidence of active sacroiliitis. Low-lying intrauterine device which appears to be at least  partially within the cervical canal. This is suboptimally evaluated and pelvic ultrasound may be performed for further characterization.    DMARD Hx Humira - lack of response   Review of Systems  Constitutional:  Positive for fatigue.  HENT:  Negative for mouth sores and mouth dryness.   Eyes:  Negative for dryness.  Respiratory:  Positive for shortness of breath.   Cardiovascular:  Positive for chest pain and palpitations.  Gastrointestinal:   Negative for blood in stool, constipation and diarrhea.  Endocrine: Negative for increased urination.  Genitourinary:  Positive for involuntary urination.  Musculoskeletal:  Positive for joint pain, gait problem, joint pain, joint swelling, myalgias, muscle weakness, morning stiffness, muscle tenderness and myalgias.  Skin:  Positive for hair loss and sensitivity to sunlight. Negative for color change and rash.  Allergic/Immunologic: Positive for susceptible to infections.  Neurological:  Positive for dizziness and headaches.  Hematological:  Negative for swollen glands.  Psychiatric/Behavioral:  Positive for depressed mood. Negative for sleep disturbance. The patient is nervous/anxious.     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   Hypokalemia 01/01/2019   Elevated hemoglobin A1c 02/12/2018   Dyslipidemia 02/05/2018   Abnormal skin growth 11/15/2016   PTSD (post-traumatic stress disorder) 10/24/2016   Severe bipolar I disorder, most recent episode depressed without psychotic features ([place]) 10/23/2016   Ankylosing spondylitis ([place]) 10/23/2016   Sacroiliac inflammation ([place]) 08/02/2016   Hypertension 05/28/2016   Vitamin D deficiency 05/02/2016   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   Hypothyroidism 02/13/2016   Anxiety and depression 01/23/2016   Status post tubal ligation 09/22/2015   Morbid obesity with BMI of 50.0-59.9, adult ([place]) 04/25/2015   Anemia, iron deficiency 01/04/2015   Headache, migraine 01/04/2015   Abnormal cytological findings in female  genital organs 07/04/2011    Past Medical History:  Diagnosis Date   Abnormal Pap smear of cervix    Arthritis    Arthritis, rheumatoid (HCC)    Atypical chest pain    Bipolar 1 disorder ([place])    Headache    MIGRAINES   Hyperlipidemia    Hypertension    Intractable nausea and vomiting 01/10/2019   Morbid obesity ([place])    a. 11/2018 s/p Robotic Sleeve Gastrectomy @ [facility].   Osteoarthritis    Palpitations    a. 09/2017 Zio Monitor: predominantly RSR, avg HR 87, 11 A tach/SVT episodes, fastest 176 x 7 beats, longest 9 beats. Rare PAC's, occas PVC's, rare couplets/triplets.   Persistent Nausea and vomiting    a. Following sleeve gastrectomy 11/2018   PTSD (post-traumatic stress disorder)    PVC (premature ventricular contraction)    Sacroiliitis (HCC)    Social phobia    Stroke [facility])     Family History  Problem Relation Age of Onset   Rheum arthritis Mother    Healthy Father    Healthy Sister    Healthy Brother    Diabetes Maternal Grandmother    Heart disease Maternal Grandmother    Heart failure Maternal Grandmother    Heart attack Maternal Grandmother    Heart Problems Maternal Grandfather    Stroke  Maternal Grandfather    Atrial fibrillation Paternal Grandmother    Stroke Other        mat great gf had stroke at 70   Atrial fibrillation Daughter    Atrial fibrillation Son    Cancer Neg Hx    Past Surgical History:  Procedure Laterality Date   CHOLECYSTECTOMY     LAPAROSCOPIC GASTRIC SLEEVE RESECTION  11/19/2018   LAPAROSCOPIC TUBAL LIGATION Bilateral 09/19/2015   Procedure: LAPAROSCOPIC BILATERAL TUBAL BANDING;  Surgeon: Brayton Mars, MD;  Location: [facility] ORS;  Service: Gynecology;  Laterality: Bilateral;   ROBOTIC ASSISTED TOTAL HYSTERECTOMY Bilateral 08/28/2021   Procedure: XI ROBOTIC ASSISTED HYSTERECTOMY, BILATERAL SALPINGECTOMY;  Surgeon: Rubie Maid, MD;  Location: [facility] ORS;  Service: Gynecology;  Laterality: Bilateral;   Social History   Social History  Narrative   Lives in [place] with husband   Right handed   Immunization History  Administered Date(s) Administered   Influenza, Seasonal, Injecte, Preservative Fre 05/30/2011   Influenza,inj,Quad PF,6+ Mos 03/04/2014, 02/22/2015, 04/17/2018, 03/12/2022   Influenza-Unspecified 03/18/2016   MMR 06/15/2015   Pneumococcal Conjugate-13 09/07/2016   Pneumococcal Polysaccharide-23 12/11/2016   Tdap 10/19/2011, 03/21/2015, 11/15/2016     Objective: Vital Signs: BP 129/82 (BP Location: Left Arm, Patient Position: Sitting, Cuff Size: Normal)   Pulse 88   Resp 15   Ht '5\' 1"'$  (1.549 m)   Wt 284 lb (128.8 kg)   LMP 06/27/2021 (Approximate)   BMI 53.66 kg/m    Physical Exam Cardiovascular:     Rate and Rhythm: Normal rate and regular rhythm.  Pulmonary:     Effort: Pulmonary effort is normal.     Breath sounds: Normal breath sounds.  Lymphadenopathy:     Cervical: No cervical adenopathy.  Skin:    General: Skin is warm and dry.     Findings: Rash present.     Comments: Facial telangiectasias rash Faintly scaly rash on scalp without erythema No digital pitting or lesions  Neurological:     Mental Status: She is alert.  Psychiatric:        Mood and Affect: Mood normal.      Musculoskeletal Exam:  Shoulders full ROM no tenderness or swelling Elbows full ROM no tenderness or swelling Wrists full ROM no tenderness or swelling Fingers full ROM no tenderness or swelling Upper back and lower back tenderness to pressure, worse midline and slightly right on the low back with lateral radiation, no pain with internal and external hip rotation Knees full ROM no tenderness or swelling Ankles full ROM no tenderness or swelling   Investigation: No additional findings.  Imaging: No results found.  Recent Labs: Lab Results  Component Value Date   WBC 7.5 08/01/2022   HGB 12.4 08/01/2022   PLT 412 (H) 08/01/2022   NA 138 04/25/2022   K 4.1 04/25/2022   CL 101 04/25/2022   CO2  23 04/25/2022   GLUCOSE 105 (H) 04/25/2022   BUN 11 04/25/2022   CREATININE 0.86 04/25/2022   BILITOT 0.3 04/25/2022   ALKPHOS 100 04/25/2022   AST 14 04/25/2022   ALT 11 04/25/2022   PROT 6.3 04/25/2022   ALBUMIN 4.1 04/25/2022   CALCIUM 9.4 04/25/2022   GFRAA 117 02/19/2019   QFTBGOLDPLUS Negative 04/25/2022    Speciality Comments: No specialty comments available.  Procedures:  No procedures performed Allergies: Patient has no known allergies.   Assessment / Plan:     Visit Diagnoses: Ankylosing spondylitis of multiple sites in spine [facility])  Sacroiliac  inflammation ([place]) - Plan: Sed Rate (ESR)  Still with considerable low back and hip pain with mixed picture of both possible inflammatory pain overnight at rest but also with use.  Rechecking some sinus rate for disease activity monitoring. May be too early for clinical efficacy so recommend continuing for another 3 months. If lack of improvement by then would recheck for disease activity including MRI before try any alternative DMARD.  Psoriasis vulgaris  Skin disease partially improved since starting treatment decreased need for topical steroid.  I expect this to continue clearing up but the fairly mild severity does not warrant long-term immunosuppression if we do not see joint benefit.  High risk medication use - Plan: CBC with Differential/Platelet, CMP14+EGFR  Checking CBC and CMP for medication monitoring after starting Toltz.  No injection reactions.  She had the flu last month which I doubt is medication associated infection but symptoms have been lingering.  Raynaud's disease without gangrene  Noticing ongoing symptoms with pallor affecting the fingers on both hands often provoked with cold but also sees this without any associated exposures he can recall.  Discoloration last for up to 1 hour usually improves after rewarming.  No cyanotic or erythematous phase and no residual lesions.  Orders: Orders Placed This  Encounter  Procedures   CBC with Differential/Platelet   CMP14+EGFR   Sed Rate (ESR)   No orders of the defined types were placed in this encounter.    Follow-Up Instructions: Return in about 3 months (around 11/28/2022) for AS on taltz f/u 39mo.   CCollier Salina MD  Note - This record has been created using [employer]  Chart creation errors have been sought, but may not always  have been located. Such creation errors do not reflect on  the standard of medical care.

## 2022-08-28 ENCOUNTER — Ambulatory Visit: Payer: Managed Care, Other (non HMO) | Attending: Internal Medicine | Admitting: Internal Medicine

## 2022-08-28 ENCOUNTER — Encounter: Payer: Self-pay | Admitting: Internal Medicine

## 2022-08-28 VITALS — BP 129/82 | HR 88 | Resp 15 | Ht 61.0 in | Wt 284.0 lb

## 2022-08-28 DIAGNOSIS — M450 Ankylosing spondylitis of multiple sites in spine: Secondary | ICD-10-CM

## 2022-08-28 DIAGNOSIS — Z79899 Other long term (current) drug therapy: Secondary | ICD-10-CM | POA: Diagnosis not present

## 2022-08-28 DIAGNOSIS — L400 Psoriasis vulgaris: Secondary | ICD-10-CM

## 2022-08-28 DIAGNOSIS — M461 Sacroiliitis, not elsewhere classified: Secondary | ICD-10-CM | POA: Diagnosis not present

## 2022-08-28 DIAGNOSIS — I7300 Raynaud's syndrome without gangrene: Secondary | ICD-10-CM

## 2022-08-30 ENCOUNTER — Other Ambulatory Visit: Payer: Self-pay | Admitting: Internal Medicine

## 2022-08-31 ENCOUNTER — Other Ambulatory Visit: Payer: Self-pay | Admitting: Family Medicine

## 2022-08-31 DIAGNOSIS — F314 Bipolar disorder, current episode depressed, severe, without psychotic features: Secondary | ICD-10-CM

## 2022-08-31 LAB — CBC WITH DIFFERENTIAL/PLATELET
Basophils Absolute: 0 10*3/uL (ref 0.0–0.2)
Basos: 0 %
EOS (ABSOLUTE): 0.2 10*3/uL (ref 0.0–0.4)
Eos: 2 %
Hematocrit: 37.6 % (ref 34.0–46.6)
Hemoglobin: 12.2 g/dL (ref 11.1–15.9)
Immature Grans (Abs): 0 10*3/uL (ref 0.0–0.1)
Immature Granulocytes: 0 %
Lymphocytes Absolute: 2 10*3/uL (ref 0.7–3.1)
Lymphs: 23 %
MCH: 26.3 pg — ABNORMAL LOW (ref 26.6–33.0)
MCHC: 32.4 g/dL (ref 31.5–35.7)
MCV: 81 fL (ref 79–97)
Monocytes Absolute: 0.7 10*3/uL (ref 0.1–0.9)
Monocytes: 9 %
Neutrophils Absolute: 5.8 10*3/uL (ref 1.4–7.0)
Neutrophils: 66 %
Platelets: 465 10*3/uL — ABNORMAL HIGH (ref 150–450)
RBC: 4.64 x10E6/uL (ref 3.77–5.28)
RDW: 14.9 % (ref 11.7–15.4)
WBC: 8.7 10*3/uL (ref 3.4–10.8)

## 2022-08-31 LAB — CMP14+EGFR
ALT: 12 IU/L (ref 0–32)
AST: 18 IU/L (ref 0–40)
Albumin/Globulin Ratio: 1.8 (ref 1.2–2.2)
Albumin: 4.2 g/dL (ref 3.9–4.9)
Alkaline Phosphatase: 116 IU/L (ref 44–121)
BUN/Creatinine Ratio: 12 (ref 9–23)
BUN: 10 mg/dL (ref 6–20)
Bilirubin Total: 0.4 mg/dL (ref 0.0–1.2)
CO2: 23 mmol/L (ref 20–29)
Calcium: 9.3 mg/dL (ref 8.7–10.2)
Chloride: 103 mmol/L (ref 96–106)
Creatinine, Ser: 0.86 mg/dL (ref 0.57–1.00)
Globulin, Total: 2.3 g/dL (ref 1.5–4.5)
Glucose: 89 mg/dL (ref 70–99)
Potassium: 4.6 mmol/L (ref 3.5–5.2)
Sodium: 140 mmol/L (ref 134–144)
Total Protein: 6.5 g/dL (ref 6.0–8.5)
eGFR: 91 mL/min/{1.73_m2} (ref >59)

## 2022-08-31 LAB — SEDIMENTATION RATE: Sed Rate: 31 mm/hr (ref 0–32)

## 2022-08-31 NOTE — Telephone Encounter (Signed)
Requested medication (s) are due for refill today: yes  Requested medication (s) are on the active medication list: yes  Last refill:  06/04/22 #90 0 refills  Future visit scheduled: no   Notes to clinic:  no refills remain. Do you want to refill Rx?     Requested Prescriptions  Pending Prescriptions Disp Refills   lamoTRIgine (LAMICTAL) 150 MG tablet [Pharmacy Med Name: LAMOTRIGINE 150MG  TABLETS] 90 tablet 0    Sig: TAKE 1 TABLET(150 MG) BY MOUTH AT BEDTIME     Neurology:  Anticonvulsants - lamotrigine Passed - 08/31/2022  3:21 AM      Passed - Cr in normal range and within 360 days    Creat  Date Value Ref Range Status  01/28/2019 0.88 0.50 - 1.10 mg/dL Final   Creatinine, Ser  Date Value Ref Range Status  08/30/2022 0.86 0.57 - 1.00 mg/dL Final   Creatinine, Urine  Date Value Ref Range Status  04/08/2015 92 mg/dL Final         Passed - ALT in normal range and within 360 days    ALT  Date Value Ref Range Status  08/30/2022 12 0 - 32 IU/L Final   SGPT (ALT)  Date Value Ref Range Status  07/19/2011 29 U/L Final    Comment:    12-78 NOTE: NEW REFERENCE RANGE 05/11/2011          Passed - AST in normal range and within 360 days    AST  Date Value Ref Range Status  08/30/2022 18 0 - 40 IU/L Final   SGOT(AST)  Date Value Ref Range Status  07/19/2011 19 15 - 37 Unit/L Final         Passed - Completed PHQ-2 or PHQ-9 in the last 360 days      Passed - Valid encounter within last 12 months    Recent Outpatient Visits           3 weeks ago [place], DO   1 month ago Elevated hemoglobin A1c   [place], DO   2 months ago Acute non-recurrent frontal sinusitis   [place], DO   5 months ago Annual physical exam   [facility] Olin Hauser, DO    8 months ago Morbid obesity with BMI of 50.0-59.9, adult [facility])   [facility] Olin Hauser, DO       Future Appointments             In 2 months Rice, Resa Miner, MD [facility]

## 2022-09-03 NOTE — Progress Notes (Signed)
Lab results look fine for continuing cosentyx. Her platelet count is slightly high. Her numbers have been high before so may be incidental but sometimes associated with active inflammation.

## 2022-10-04 ENCOUNTER — Ambulatory Visit: Payer: Self-pay | Admitting: *Deleted

## 2022-10-04 NOTE — Telephone Encounter (Signed)
  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 Pertinent Negatives: Patient denies fever, abdomen pain, burning with urination, blood in urine Disposition: [] ED /[] Urgent Care (no appt availability in office) / [x] Appointment(In office/virtual)/ []  [facility]/ [] Home Care/ [] Refused Recommended Disposition /[] [place] [facility]/ []  Follow-up with PCP Additional Notes: Patient was unable to get appointment with specialist- she has been scheduled at office for evaluation    Reason for Disposition  [1] MODERATE back pain (e.g., interferes with normal activities) AND [2] present > 3 days  Answer Assessment - Initial Assessment Questions 1. ONSET: "When did the pain begin?"      Lower back pain- bilateral, toes, ankle, knee, hip, wrist, elbow joint pain 2. LOCATION: "Where does it hurt?" (upper, mid or lower back)     See above 3. SEVERITY: "How bad is the pain?"  (e.g., Scale 1-10; mild, moderate, or severe)   - MILD (1-3): Doesn't interfere with normal activities.    - MODERATE (4-7): Interferes with normal activities or awakens from sleep.    - SEVERE (8-10): Excruciating pain, unable to do any normal activities.      Moderate- almost feels like a "flare" but specialist can't see her today/tomorrow 4. PATTERN: "Is the pain constant?" (e.g., yes, no; constant, intermittent)      constant 5. RADIATION: "Does the pain shoot into your legs or somewhere else?"     Into hip 6. CAUSE:  "What do you think is causing the back pain?"      SI joint arthritis, rheumatoid arthritis, sacroiliitis  7. 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 MEDICINES: "What have you taken so far for the pain?" (e.g., nothing, acetaminophen, NSAIDS)     Heating pad,  tylenol arthritis  9. NEUROLOGIC SYMPTOMS: "Do you have any weakness, numbness, or problems with bowel/bladder control?"     no 10. OTHER SYMPTOMS: "Do you have any other symptoms?" (e.g., fever, abdomen pain, burning with urination, blood in urine)       no  Protocols used: Back Pain-A-AH

## 2022-10-04 NOTE — Telephone Encounter (Signed)
Summary: back/joint pain   Pt called in with joint/back pain. Pt says that she seeing a Rhumatologist but they doesn't have any openings. Not showing that PCP has openings either. Pt would like to speak with a nurse further.

## 2022-10-05 ENCOUNTER — Ambulatory Visit (INDEPENDENT_AMBULATORY_CARE_PROVIDER_SITE_OTHER): Payer: Managed Care, Other (non HMO) | Admitting: Family Medicine

## 2022-10-05 VITALS — BP 126/78 | HR 88 | Temp 98.1°F | Ht 61.0 in | Wt 280.0 lb

## 2022-10-05 DIAGNOSIS — M545 Low back pain, unspecified: Secondary | ICD-10-CM

## 2022-10-05 DIAGNOSIS — M450 Ankylosing spondylitis of multiple sites in spine: Secondary | ICD-10-CM

## 2022-10-05 DIAGNOSIS — G8929 Other chronic pain: Secondary | ICD-10-CM

## 2022-10-05 MED ORDER — TRAMADOL HCL 50 MG PO TABS: 50.0000 mg | ORAL_TABLET | Freq: Three times a day (TID) | ORAL | 0 refills | Status: DC | PRN

## 2022-10-05 MED ORDER — BACLOFEN 10 MG PO TABS: 5.0000 mg | ORAL_TABLET | Freq: Three times a day (TID) | ORAL | 0 refills | Status: DC | PRN

## 2022-10-05 NOTE — Progress Notes (Signed)
Subjective:    Patient ID: Leah Osborne, female    DOB: 1986/01/21, 37 y.o.   MRN: [id number]  Leah Osborne is a 37 y.o. female presenting on 10/05/2022 for Back Pain (Lower back pain, that radiates around the hips x 5 days. Pt complain that she has a aching, throbbing, burning and stiffness in her back. She also complains of joint pain, ankles, wrist , toes, elbow and knees. X 5 days. She state the symptoms are gradually worsening.)  Patient presents for a same day appointment.  HPI  Back Pain / Generalized Pain Flare up Ankylosing Spondylitis / Inflammatory arthritis / Psoriatic Arthritis  Followed by [facility] [place]  Patient presents to clinic today with complaint of joint and back pain.  This started 5 days ago approximately. 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  She describes the pain as generalized aching stiffness.  She has a history of ankylosing spondylitis.  Persistent symptoms with aching pain, worse with activity Improved with stretching and posture  Now on Taltz medication per Rheumatology She is on every 2 weeks  Next apt with them is in June 2024       10/05/2022    3:25 PM 08/01/2022    9:08 AM 12/25/2021    8:56 AM  Depression screen PHQ 2/9  Decreased Interest 0 0 1  Down, Depressed, Hopeless 0 0 0  PHQ - 2 Score 0 0 1  Altered sleeping 0 0 0  Tired, decreased energy 2 2 2   Change in appetite 0 1 1  Feeling bad or failure about yourself  0 0 0  Trouble concentrating 1 0 1  Moving slowly or fidgety/restless 0 2 0  Suicidal thoughts 0 0 0  PHQ-9 Score 3 5 5   Difficult doing work/chores Somewhat difficult Somewhat difficult Somewhat difficult    Social History   Tobacco Use   Smoking status: Never    Passive exposure: Never   Smokeless tobacco: Never  Vaping Use   Vaping Use: Never used  Substance Use Topics   Alcohol use: Not  Currently    Alcohol/week: 0.0 standard drinks of alcohol   Drug use: No    Review of Systems Per HPI unless specifically indicated above     Objective:    BP 126/78 (BP Location: Left Arm)   Pulse 88   Temp 98.1 F (36.7 C) (Oral)   Ht 5\' 1"  (1.549 m)   Wt 280 lb (127 kg)   LMP 06/27/2021 (Approximate)   HC 2" (5.1 cm)   SpO2 98%   BMI 52.91 kg/m   Wt Readings from Last 3 Encounters:  10/05/22 280 lb (127 kg)  08/28/22 284 lb (128.8 kg)  08/01/22 281 lb (127.5 kg)    Physical Exam Vitals and nursing note reviewed.  Constitutional:      General: She is not in acute distress.    Appearance: Normal appearance. She is well-developed. She is obese. She is not diaphoretic.     Comments: Well-appearing, discomfort with movements, cooperative  HENT:     Head: Normocephalic and atraumatic.  Eyes:     General:        Right eye: No discharge.        Left eye: No discharge.     Conjunctiva/sclera: Conjunctivae normal.  Cardiovascular:     Rate and Rhythm: Normal rate.  Pulmonary:     Effort: Pulmonary effort is  normal.  Skin:    General: Skin is warm and dry.     Findings: No erythema or rash.  Neurological:     Mental Status: She is alert and oriented to person, place, and time.  Psychiatric:        Mood and Affect: Mood normal.        Behavior: Behavior normal.        Thought Content: Thought content normal.     Comments: Well groomed, good eye contact, normal speech and thoughts      Results for orders placed or performed in visit on 08/30/22  CMP14+EGFR  Result Value Ref Range   Glucose 89 70 - 99 mg/dL   BUN 10 6 - 20 mg/dL   Creatinine, Ser 1.61 0.57 - 1.00 mg/dL   eGFR 91 >09 UE/AVW/0.98   BUN/Creatinine Ratio 12 9 - 23   Sodium 140 134 - 144 mmol/L   Potassium 4.6 3.5 - 5.2 mmol/L   Chloride 103 96 - 106 mmol/L   CO2 23 20 - 29 mmol/L   Calcium 9.3 8.7 - 10.2 mg/dL   Total Protein 6.5 6.0 - 8.5 g/dL   Albumin 4.2 3.9 - 4.9 g/dL   Globulin, Total  2.3 1.5 - 4.5 g/dL   Albumin/Globulin Ratio 1.8 1.2 - 2.2   Bilirubin Total 0.4 0.0 - 1.2 mg/dL   Alkaline Phosphatase 116 44 - 121 IU/L   AST 18 0 - 40 IU/L   ALT 12 0 - 32 IU/L  CBC with Differential/Platelet  Result Value Ref Range   WBC 8.7 3.4 - 10.8 x10E3/uL   RBC 4.64 3.77 - 5.28 x10E6/uL   Hemoglobin 12.2 11.1 - 15.9 g/dL   Hematocrit 11.9 14.7 - 46.6 %   MCV 81 79 - 97 fL   MCH 26.3 (L) 26.6 - 33.0 pg   MCHC 32.4 31.5 - 35.7 g/dL   RDW 82.9 56.2 - 13.0 %   Platelets 465 (H) 150 - 450 x10E3/uL   Neutrophils 66 Not Estab. %   Lymphs 23 Not Estab. %   Monocytes 9 Not Estab. %   Eos 2 Not Estab. %   Basos 0 Not Estab. %   Neutrophils Absolute 5.8 1.4 - 7.0 x10E3/uL   Lymphocytes Absolute 2.0 0.7 - 3.1 x10E3/uL   Monocytes Absolute 0.7 0.1 - 0.9 x10E3/uL   EOS (ABSOLUTE) 0.2 0.0 - 0.4 x10E3/uL   Basophils Absolute 0.0 0.0 - 0.2 x10E3/uL   Immature Granulocytes 0 Not Estab. %   Immature Grans (Abs) 0.0 0.0 - 0.1 x10E3/uL  Sedimentation rate  Result Value Ref Range   Sed Rate 31 0 - 32 mm/hr      Assessment & Plan:   Problem List Items Addressed This Visit     Ankylosing spondylitis - Primary   Relevant Medications   baclofen (LIORESAL) 10 MG tablet   traMADol (ULTRAM) 50 MG tablet   Other Visit Diagnoses     Chronic midline low back pain without sciatica       Relevant Medications   baclofen (LIORESAL) 10 MG tablet   traMADol (ULTRAM) 50 MG tablet       Acute vs subacute on chronic back / generalized pain flare up with ankylosing spondylitis and inflammatory arthritis  Followed by Rheumatology on Taltz  Will treat as flare up of pain right now, no known injury based on history  Limit steroids/prednisone and NSAIDs due to risks and prior history of use  Take the Tramadol as  needed, caution sedation, hopefully it works well. It can be taken up to every 8 hours as needed. After 1st week can start tapering it down more. And use intermittent.  Start taking  Baclofen (Lioresal)  (muscle relaxant) - start with one pill at night as needed for next 1-3 nights (may make you drowsy, caution with driving) see how it affects you, then if tolerated increase to one pill 2 to 3 times a day or (every 8 hours as needed)   Meds ordered this encounter  Medications   baclofen (LIORESAL) 10 MG tablet    Sig: Take 0.5-1 tablets (5-10 mg total) by mouth 3 (three) times daily as needed for muscle spasms.    Dispense:  60 each    Refill:  0   traMADol (ULTRAM) 50 MG tablet    Sig: Take 1 tablet (50 mg total) by mouth every 8 (eight) hours as needed.    Dispense:  30 tablet    Refill:  0      Follow up plan: Return if symptoms worsen or fail to improve.   Saralyn Pilar, DO [facility] 10/05/2022, 5:42 PM

## 2022-10-05 NOTE — Progress Notes (Signed)
Entered in error

## 2022-10-05 NOTE — Patient Instructions (Addendum)
Thank you for coming to the office today.  Take the Tramadol as needed, caution sedation, hopefully it works well. It can be taken up to every 8 hours as needed. After 1st week can start tapering it down more. And use intermittent.  Start taking Baclofen (Lioresal)  (muscle relaxant) - start with one pill at night as needed for next 1-3 nights (may make you drowsy, caution with driving) see how it affects you, then if tolerated increase to one pill 2 to 3 times a day or (every 8 hours as needed)  Let me know how it goes and we can adjust.   Please schedule a Follow-up Appointment to: No follow-ups on file.  If you have any other questions or concerns, please feel free to call the office or send a message through MyChart. You may also schedule an earlier appointment if necessary.  Additionally, you may be receiving a survey about your experience at our office within a few days to 1 week by e-mail or mail. We value your feedback.  Saralyn Pilar, DO [facility], [place]

## 2022-10-25 ENCOUNTER — Other Ambulatory Visit: Payer: Self-pay | Admitting: Family Medicine

## 2022-10-25 NOTE — Progress Notes (Signed)
This medication or product was previously approved on [id number] from 08/28/2022 to 02/28/2023. You will be able to fill a prescription for this medication at your pharmacy. If your pharmacy has questions regarding the processing of your prescription, please have them call the [facility] pharmacy help desk at [phone].

## 2022-10-25 NOTE — Telephone Encounter (Signed)
Requested Prescriptions  Pending Prescriptions Disp Refills   WEGOVY 1 MG/0.5ML SOAJ [Pharmacy Med Name: WEGOVY 1MG /0.5ML  PF PEN INJ] 2 mL 0    Sig: ADMINISTER 1 MG UNDER THE SKIN 1 TIME A WEEK     Endocrinology:  Diabetes - GLP-1 Receptor Agonists - semaglutide Failed - 10/25/2022 10:25 AM      Failed - HBA1C in normal range and within 180 days    Hemoglobin A1C  Date Value Ref Range Status  08/01/2022 6.4 (A) 4.0 - 5.6 % Final  01/24/2022 6.2  Final         Passed - Cr in normal range and within 360 days    Creat  Date Value Ref Range Status  01/28/2019 0.88 0.50 - 1.10 mg/dL Final   Creatinine, Ser  Date Value Ref Range Status  08/30/2022 0.86 0.57 - 1.00 mg/dL Final   Creatinine, Urine  Date Value Ref Range Status  04/08/2015 92 mg/dL Final         Passed - Valid encounter within last 6 months    Recent Outpatient Visits           2 weeks ago Ankylosing spondylitis of multiple sites in spine [facility])   [place] [facility] Smitty Cords, DO   2 months ago Influenza B   [place] [facility] Smitty Cords, DO   2 months ago Elevated hemoglobin A1c   [place] [facility] Smitty Cords, DO   4 months ago Acute non-recurrent frontal sinusitis   [place] [facility] Smitty Cords, DO   7 months ago Annual physical exam    [place] [facility] Smitty Cords, DO       Future Appointments             In 1 month Rice, Jamesetta Orleans, MD [facility]

## 2022-10-25 NOTE — Progress Notes (Signed)
Wegovy prior auth submitted to Covermymeds (Key: [id number])   [facility] is reviewing your PA request. Typically an electronic response will be received within 24-72 hours. To check for an update later, open this request from your dashboard.  You may close this dialog and return to your dashboard to perform other tasks.

## 2022-10-26 ENCOUNTER — Encounter: Payer: Self-pay | Admitting: Family Medicine

## 2022-10-29 ENCOUNTER — Encounter: Payer: Self-pay | Admitting: Family Medicine

## 2022-10-29 ENCOUNTER — Ambulatory Visit (INDEPENDENT_AMBULATORY_CARE_PROVIDER_SITE_OTHER): Payer: Managed Care, Other (non HMO) | Admitting: Family Medicine

## 2022-10-29 VITALS — BP 122/82 | HR 75 | Ht 61.0 in | Wt 280.0 lb

## 2022-10-29 DIAGNOSIS — R110 Nausea: Secondary | ICD-10-CM

## 2022-10-29 DIAGNOSIS — G8929 Other chronic pain: Secondary | ICD-10-CM

## 2022-10-29 DIAGNOSIS — M545 Low back pain, unspecified: Secondary | ICD-10-CM

## 2022-10-29 DIAGNOSIS — R112 Nausea with vomiting, unspecified: Secondary | ICD-10-CM

## 2022-10-29 DIAGNOSIS — R1011 Right upper quadrant pain: Secondary | ICD-10-CM

## 2022-10-29 DIAGNOSIS — M450 Ankylosing spondylitis of multiple sites in spine: Secondary | ICD-10-CM

## 2022-10-29 DIAGNOSIS — Z6841 Body Mass Index (BMI) 40.0 and over, adult: Secondary | ICD-10-CM

## 2022-10-29 MED ORDER — ONDANSETRON 4 MG PO TBDP: 4.0000 mg | ORAL_TABLET | Freq: Three times a day (TID) | ORAL | 2 refills | Status: DC | PRN

## 2022-10-29 MED ORDER — TRAMADOL HCL 50 MG PO TABS: 50.0000 mg | ORAL_TABLET | Freq: Three times a day (TID) | ORAL | 0 refills | Status: DC | PRN

## 2022-10-29 MED ORDER — WEGOVY 1.7 MG/0.75ML ~~LOC~~ SOAJ: 1.7000 mg | SUBCUTANEOUS | 0 refills | Status: DC

## 2022-10-29 NOTE — Patient Instructions (Addendum)
Thank you for coming to the office today.  Lab today for liver enzymes.  RUQ Abdominal Liver Ultrasound ordered at [facility]. They will schedule this. And contact you.  Insurance likely only covers 4 weeks per dose of Wegovy  New rx wegovy 1.7 mg weekly inj to pharmacy. We will work on approval.  Maybe only have 1 more dose left at 2.4mg  next month. Then we can consider maintenance dose  I am worried about symptoms being side effects of med, we can push through for now but may need to pause in future.  Try to get the Pantoprazole 40mg  from GI  Please schedule a Follow-up Appointment to: Return if symptoms worsen or fail to improve.  If you have any other questions or concerns, please feel free to call the office or send a message through MyChart. You may also schedule an earlier appointment if necessary.  Additionally, you may be receiving a survey about your experience at our office within a few days to 1 week by e-mail or mail. We value your feedback.  Leah Pilar, DO [facility], [place]

## 2022-10-29 NOTE — Progress Notes (Signed)
Subjective:    Patient ID: Leah Osborne, female    DOB: Jul 07, 1985, 37 y.o.   MRN: [id number]  Leah Osborne is a 37 y.o. female presenting on 10/29/2022 for Abdominal Pain (Started a week ago, symptoms include throbbing burning aching)   HPI  RUQ Abdominal Pain Episodic Nausea vomiting   Reports RUQ abdomen area feels swollen and tender. Seems to be episodic with flare at times.  Admits feeling significant fatigue and way more tired  Reduced appetite  Admits nausea vomiting with x 3 episodes past few days with "bile" or stomach acid coming up.  S/p Cholecystectomy. [facility] - s/p EGD recently  Now has Pantoprazole 40mg  daily rx but not picked up.  Tramadol AS NEEDED, next apt in June, helps her manage and can function      10/05/2022    3:25 PM 08/01/2022    9:08 AM 12/25/2021    8:56 AM  Depression screen PHQ 2/9  Decreased Interest 0 0 1  Down, Depressed, Hopeless 0 0 0  PHQ - 2 Score 0 0 1  Altered sleeping 0 0 0  Tired, decreased energy 2 2 2   Change in appetite 0 1 1  Feeling bad or failure about yourself  0 0 0  Trouble concentrating 1 0 1  Moving slowly or fidgety/restless 0 2 0  Suicidal thoughts 0 0 0  PHQ-9 Score 3 5 5   Difficult doing work/chores Somewhat difficult Somewhat difficult Somewhat difficult    Social History   Tobacco Use   Smoking status: Never    Passive exposure: Never   Smokeless tobacco: Never  Vaping Use   Vaping Use: Never used  Substance Use Topics   Alcohol use: Not Currently    Alcohol/week: 0.0 standard drinks of alcohol   Drug use: No    Review of Systems Per HPI unless specifically indicated above     Objective:    BP 122/82   Pulse 75   Ht 5\' 1"  (1.549 m)   Wt 280 lb (127 kg)   LMP 06/27/2021 (Approximate)   SpO2 96%   BMI 52.91 kg/m   Wt Readings from Last 3 Encounters:  10/29/22 280 lb (127 kg)  10/05/22 280 lb (127 kg)  08/28/22 284 lb (128.8 kg)    Physical Exam Vitals and nursing  note reviewed.  Constitutional:      General: She is not in acute distress.    Appearance: She is well-developed. She is obese. She is not diaphoretic.     Comments: Well-appearing, comfortable, cooperative  HENT:     Head: Normocephalic and atraumatic.  Eyes:     General:        Right eye: No discharge.        Left eye: No discharge.     Conjunctiva/sclera: Conjunctivae normal.  Neck:     Thyroid: No thyromegaly.  Cardiovascular:     Rate and Rhythm: Normal rate and regular rhythm.     Heart sounds: Normal heart sounds. No murmur heard. Pulmonary:     Effort: Pulmonary effort is normal. No respiratory distress.     Breath sounds: Normal breath sounds. No wheezing or rales.  Abdominal:     General: There is no distension.     Palpations: Abdomen is soft.     Tenderness: There is abdominal tenderness in the right upper quadrant. There is no left CVA tenderness, guarding or rebound. Negative signs include Murphy's sign, Rovsing's sign and McBurney's sign.  Musculoskeletal:  General: Normal range of motion.     Cervical back: Normal range of motion and neck supple.  Lymphadenopathy:     Cervical: No cervical adenopathy.  Skin:    General: Skin is warm and dry.     Findings: No erythema or rash.  Neurological:     Mental Status: She is alert and oriented to person, place, and time.  Psychiatric:        Behavior: Behavior normal.     Comments: Well groomed, good eye contact, normal speech and thoughts      Results for orders placed or performed in visit on 08/30/22  CMP14+EGFR  Result Value Ref Range   Glucose 89 70 - 99 mg/dL   BUN 10 6 - 20 mg/dL   Creatinine, Ser 9.56 0.57 - 1.00 mg/dL   eGFR 91 >21 HY/QMV/7.84   BUN/Creatinine Ratio 12 9 - 23   Sodium 140 134 - 144 mmol/L   Potassium 4.6 3.5 - 5.2 mmol/L   Chloride 103 96 - 106 mmol/L   CO2 23 20 - 29 mmol/L   Calcium 9.3 8.7 - 10.2 mg/dL   Total Protein 6.5 6.0 - 8.5 g/dL   Albumin 4.2 3.9 - 4.9 g/dL    Globulin, Total 2.3 1.5 - 4.5 g/dL   Albumin/Globulin Ratio 1.8 1.2 - 2.2   Bilirubin Total 0.4 0.0 - 1.2 mg/dL   Alkaline Phosphatase 116 44 - 121 IU/L   AST 18 0 - 40 IU/L   ALT 12 0 - 32 IU/L  CBC with Differential/Platelet  Result Value Ref Range   WBC 8.7 3.4 - 10.8 x10E3/uL   RBC 4.64 3.77 - 5.28 x10E6/uL   Hemoglobin 12.2 11.1 - 15.9 g/dL   Hematocrit 69.6 29.5 - 46.6 %   MCV 81 79 - 97 fL   MCH 26.3 (L) 26.6 - 33.0 pg   MCHC 32.4 31.5 - 35.7 g/dL   RDW 28.4 13.2 - 44.0 %   Platelets 465 (H) 150 - 450 x10E3/uL   Neutrophils 66 Not Estab. %   Lymphs 23 Not Estab. %   Monocytes 9 Not Estab. %   Eos 2 Not Estab. %   Basos 0 Not Estab. %   Neutrophils Absolute 5.8 1.4 - 7.0 x10E3/uL   Lymphocytes Absolute 2.0 0.7 - 3.1 x10E3/uL   Monocytes Absolute 0.7 0.1 - 0.9 x10E3/uL   EOS (ABSOLUTE) 0.2 0.0 - 0.4 x10E3/uL   Basophils Absolute 0.0 0.0 - 0.2 x10E3/uL   Immature Granulocytes 0 Not Estab. %   Immature Grans (Abs) 0.0 0.0 - 0.1 x10E3/uL  Sedimentation rate  Result Value Ref Range   Sed Rate 31 0 - 32 mm/hr      Assessment & Plan:   Problem List Items Addressed This Visit     Ankylosing spondylitis (HCC)   Relevant Medications   traMADol (ULTRAM) 50 MG tablet (Start on 11/03/2022)   Morbid obesity with BMI of 50.0-59.9, adult (HCC)   Relevant Medications   WEGOVY 1.7 MG/0.75ML SOAJ   Other Visit Diagnoses     RUQ abdominal pain    -  Primary   Relevant Orders   US ABDOMEN LIMITED RUQ (LIVER/GB)   Hepatic function panel   Nausea and vomiting, unspecified vomiting type       Relevant Orders   US ABDOMEN LIMITED RUQ (LIVER/GB)   Hepatic function panel   Nausea       Relevant Medications   ondansetron (ZOFRAN-ODT) 4 MG disintegrating tablet  Chronic midline low back pain without sciatica       Relevant Medications   traMADol (ULTRAM) 50 MG tablet (Start on 11/03/2022)       RUQ Abdominal pain S/p cholecystectomy historically  Lab today for liver  enzymes.  RUQ Abdominal Liver Ultrasound ordered at [facility]. They will schedule this. And contact you.  Agree to re order Tramadol AS NEEDED pain  Insurance likely only covers 4 weeks per dose of Wegovy New rx wegovy 1.7 mg weekly inj to pharmacy. We will work on approval. Maybe only have 1 more dose left at 2.4mg  next month. Then we can consider maintenance dose  I am worried about symptoms being side effects of med, we can push through for now but may need to pause in future.  Try to get the Pantoprazole 40mg  from GI  Return criteria reviewed.   Meds ordered this encounter  Medications   WEGOVY 1.7 MG/0.75ML SOAJ    Sig: Inject 1.7 mg into the skin once a week.    Dispense:  3 mL    Refill:  0   ondansetron (ZOFRAN-ODT) 4 MG disintegrating tablet    Sig: Take 1 tablet (4 mg total) by mouth every 8 (eight) hours as needed for nausea or vomiting.    Dispense:  20 tablet    Refill:  2   traMADol (ULTRAM) 50 MG tablet    Sig: Take 1 tablet (50 mg total) by mouth every 8 (eight) hours as needed.    Dispense:  30 tablet    Refill:  0    First fill 11/03/22     Follow up plan: Return if symptoms worsen or fail to improve.   Saralyn Pilar, DO [facility] 10/29/2022, 1:49 PM

## 2022-11-01 ENCOUNTER — Ambulatory Visit
Admission: RE | Admit: 2022-11-01 | Discharge: 2022-11-01 | Disposition: A | Discharge status: Home / Self Care | Payer: Managed Care, Other (non HMO) | Source: Ambulatory Visit | Attending: Family Medicine | Admitting: Family Medicine

## 2022-11-01 ENCOUNTER — Telehealth: Payer: Self-pay | Admitting: Pharmacist

## 2022-11-01 ENCOUNTER — Telehealth: Payer: Self-pay

## 2022-11-01 DIAGNOSIS — R112 Nausea with vomiting, unspecified: Secondary | ICD-10-CM | POA: Diagnosis not present

## 2022-11-01 DIAGNOSIS — R1011 Right upper quadrant pain: Secondary | ICD-10-CM | POA: Insufficient documentation

## 2022-11-01 NOTE — Telephone Encounter (Signed)
(  Key: [id number])   [employer] is processing your PA request and will respond shortly with next steps. You may close this dialog, return to your dashboard, and perform other tasks. To check for an update later, open this request again from your dashboard.  If you need assistance, please chat with CoverMyMeds or call us at [phone].   [employer] has not yet replied to your PA request. You may close this dialog, return to your dashboard, and perform other tasks.  To check for an update later, open this request again from your dashboard.  If [employer] has not replied to your request within 24 hours, please reach out to the plan using the phone number located on the back on the [employer] card.

## 2022-11-01 NOTE — Telephone Encounter (Signed)
Received notification from [facility] regarding a prior authorization for TALTZ. Authorization has been APPROVED from 11/01/22 to 11/01/23. Approval letter sent to scan center.  Patient must continue to fill through [facility]: [phone]   Authorization # [id number]  Chesley Mires, PharmD, MPH, BCPS, CPP Clinical Pharmacist (Rheumatology and Pulmonology)

## 2022-11-01 NOTE — Telephone Encounter (Signed)
Submitted a Prior Authorization request to [facility] for TALTZ via CoverMyMeds. Will update once we receive a response.  Key: [id number]

## 2022-11-02 LAB — HEPATIC FUNCTION PANEL
ALT: 20 IU/L (ref 0–32)
AST: 16 IU/L (ref 0–40)
Albumin: 4.3 g/dL (ref 3.9–4.9)
Alkaline Phosphatase: 108 IU/L (ref 44–121)
Bilirubin Total: 0.4 mg/dL (ref 0.0–1.2)
Bilirubin, Direct: 0.14 mg/dL (ref 0.00–0.40)
Total Protein: 6.5 g/dL (ref 6.0–8.5)

## 2022-11-02 NOTE — Telephone Encounter (Signed)
This request has received a Favorable outcome.  Please note any additional information provided by [employer] at the bottom of this request. 

## 2022-11-06 ENCOUNTER — Encounter: Payer: Self-pay | Admitting: Family Medicine

## 2022-11-26 ENCOUNTER — Ambulatory Visit (INDEPENDENT_AMBULATORY_CARE_PROVIDER_SITE_OTHER): Payer: Managed Care, Other (non HMO) | Admitting: Family Medicine

## 2022-11-26 ENCOUNTER — Encounter: Payer: Self-pay | Admitting: Family Medicine

## 2022-11-26 VITALS — BP 133/88 | HR 94 | Temp 99.1°F | Resp 20 | Ht 61.0 in | Wt 278.8 lb

## 2022-11-26 DIAGNOSIS — R051 Acute cough: Secondary | ICD-10-CM

## 2022-11-26 DIAGNOSIS — J029 Acute pharyngitis, unspecified: Secondary | ICD-10-CM | POA: Diagnosis not present

## 2022-11-26 DIAGNOSIS — J0110 Acute frontal sinusitis, unspecified: Secondary | ICD-10-CM | POA: Diagnosis not present

## 2022-11-26 LAB — POCT RAPID STREP A (OFFICE): Rapid Strep A Screen: NEGATIVE

## 2022-11-26 LAB — POC COVID19 BINAXNOW: SARS Coronavirus 2 Ag: NEGATIVE

## 2022-11-26 MED ORDER — BENZONATATE 200 MG PO CAPS
200.0000 mg | ORAL_CAPSULE | Freq: Three times a day (TID) | ORAL | 0 refills | Status: DC | PRN
Start: 2022-11-26 — End: 2024-02-20

## 2022-11-26 MED ORDER — AMOXICILLIN-POT CLAVULANATE 875-125 MG PO TABS
1.0000 | ORAL_TABLET | Freq: Two times a day (BID) | ORAL | 0 refills | Status: DC
Start: 2022-11-26 — End: 2023-02-07

## 2022-11-26 NOTE — Patient Instructions (Addendum)
Thank you for coming to the office today.  Likely viral then may have shifted to bacterial Ears no infection, but pressure and fluid COVID and Strep negative No tonsil stones at this time.  Treat symptoms first Start Tessalon Perls take 1 capsule up to 3 times a day as needed for cough  Mucinex-DM Start nasal steroid Flonase 2 sprays in each nostril daily for 4-6 weeks, may repeat course seasonally or as needed  Add Ibuprofen for swelling and sore throat 200mg  x 2-3 per dose up to 3 times per day.  If not improving 48 hours, can take Antibiotic augmentin.   Please schedule a Follow-up Appointment to: Return if symptoms worsen or fail to improve.  If you have any other questions or concerns, please feel free to call the office or send a message through MyChart. You may also schedule an earlier appointment if necessary.  Additionally, you may be receiving a survey about your experience at our office within a few days to 1 week by e-mail or mail. We value your feedback.  Saralyn Pilar, DO [facility], [place]

## 2022-11-26 NOTE — Progress Notes (Signed)
Acute Office Visit  Subjective:     Patient ID: Leah Osborne, female    DOB: 11-22-1985, 37 y.o.   MRN: [id number]  Chief Complaint  Patient presents with   Sore Throat    Sore throat x 5 days,headache x 4 days , coughing     HPI  Sore Throat / Headache / Congestion / Cough Reports symptoms started Thursday last week with sore throat and then progressed to headache and congestion. Also admits ears painful and pressure. Seen tonsil stones in back of throat as well Tried Cough Drops, Soup, Promethazine cough syrup  Denies fever dyspnea nausea vomiting   ROS      Objective:    BP 133/88 (BP Location: Left Arm, Patient Position: Sitting, Cuff Size: Large)   Pulse 94   Temp 99.1 F (37.3 C) (Oral)   Resp 20   Ht 5\' 1"  (1.549 m)   Wt 278 lb 12.8 oz (126.5 kg)   LMP 06/27/2021 (Approximate)   SpO2 99%   BMI 52.68 kg/m    Physical Exam Vitals and nursing note reviewed.  Constitutional:      General: She is not in acute distress.    Appearance: She is well-developed. She is not diaphoretic.     Comments: Well-appearing, comfortable, cooperative  HENT:     Head: Normocephalic and atraumatic.     Right Ear: Ear canal and external ear normal. There is no impacted cerumen.     Left Ear: Ear canal and external ear normal. There is no impacted cerumen.     Ears:     Comments: TM with effusion clear    Nose: No congestion.     Mouth/Throat:     Mouth: Mucous membranes are moist.     Pharynx: Posterior oropharyngeal erythema present. No oropharyngeal exudate.  Eyes:     General:        Right eye: No discharge.        Left eye: No discharge.     Conjunctiva/sclera: Conjunctivae normal.  Neck:     Thyroid: No thyromegaly.  Cardiovascular:     Rate and Rhythm: Normal rate and regular rhythm.     Heart sounds: Normal heart sounds. No murmur heard. Pulmonary:     Effort: Pulmonary effort is normal. No respiratory distress.     Breath sounds: Normal breath  sounds. No wheezing or rales.  Musculoskeletal:        General: Normal range of motion.     Cervical back: Normal range of motion and neck supple.  Lymphadenopathy:     Cervical: No cervical adenopathy.  Skin:    General: Skin is warm and dry.     Findings: No erythema or rash.  Neurological:     Mental Status: She is alert and oriented to person, place, and time.  Psychiatric:        Behavior: Behavior normal.     Comments: Well groomed, good eye contact, normal speech and thoughts     Results for orders placed or performed in visit on 11/26/22  POCT rapid strep A  Result Value Ref Range   Rapid Strep A Screen Negative Negative  POC COVID-19  Result Value Ref Range   SARS Coronavirus 2 Ag Negative Negative        Assessment & Plan:   Problem List Items Addressed This Visit   None Visit Diagnoses     Acute non-recurrent frontal sinusitis    -  Primary   Relevant Medications  amoxicillin-clavulanate (AUGMENTIN) 875-125 MG tablet   benzonatate (TESSALON) 200 MG capsule   Acute sore throat       Relevant Orders   POCT rapid strep A (Completed)   POC COVID-19 (Completed)   Acute cough       Relevant Medications   benzonatate (TESSALON) 200 MG capsule      Likely viral then may have shifted to bacterial Ears no infection, but pressure and fluid COVID and Strep negative No tonsil stones at this time.  Treat symptoms first Start Tessalon Perls take 1 capsule up to 3 times a day as needed for cough  Mucinex-DM Start nasal steroid Flonase 2 sprays in each nostril daily for 4-6 weeks, may repeat course seasonally or as needed  Add Ibuprofen for swelling and sore throat 200mg  x 2-3 per dose up to 3 times per day.  If not improving 48 hours, can take Antibiotic augmentin.  Meds ordered this encounter  Medications   amoxicillin-clavulanate (AUGMENTIN) 875-125 MG tablet    Sig: Take 1 tablet by mouth 2 (two) times daily.    Dispense:  20 tablet    Refill:  0    benzonatate (TESSALON) 200 MG capsule    Sig: Take 1 capsule (200 mg total) by mouth 3 (three) times daily as needed for cough.    Dispense:  30 capsule    Refill:  0    Return if symptoms worsen or fail to improve.  Saralyn Pilar, DO [facility] 11/26/2022, 3:55 PM

## 2022-11-28 ENCOUNTER — Encounter: Payer: Self-pay | Admitting: Internal Medicine

## 2022-11-28 ENCOUNTER — Ambulatory Visit: Payer: Managed Care, Other (non HMO) | Attending: Internal Medicine | Admitting: Internal Medicine

## 2022-11-28 VITALS — BP 145/88 | HR 72 | Resp 16 | Ht 61.0 in | Wt 277.0 lb

## 2022-11-28 DIAGNOSIS — M461 Sacroiliitis, not elsewhere classified: Secondary | ICD-10-CM

## 2022-11-28 DIAGNOSIS — M450 Ankylosing spondylitis of multiple sites in spine: Secondary | ICD-10-CM

## 2022-11-28 DIAGNOSIS — L400 Psoriasis vulgaris: Secondary | ICD-10-CM

## 2022-11-28 DIAGNOSIS — Z79899 Other long term (current) drug therapy: Secondary | ICD-10-CM | POA: Diagnosis not present

## 2022-11-28 NOTE — Progress Notes (Signed)
Office Visit Note  Patient: Leah Osborne             Date of Birth: 01/25/1986           MRN: [id number]             PCP: Smitty Cords, DO Referring: Saralyn Pilar * Visit Date: 11/28/2022   Subjective:  Follow-up   History of Present Illness: ADRIENNE TROMBETTA is a 37 y.o. 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  Her skin rashes are doing very well.  Back pain is not doing well.  She has at least 2 flareups of increased pain since her last visit in March.  She had a negative experience calling about symptoms in April related to this.  She started softball coaching which was a large increase in her amount of running and physical activity overall.  Subsequently had exacerbation of low back pain.  This improved with tramadol and muscle relaxer medication.  Still having a fair amount of symptoms daily with low back pain and stiffness.  Previous HPI 08/28/22 CLEON THOMA is a 37 y.o. 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 Due to initial delay with medication approval she started February 1st so only on treatment about 6 weeks. So far she notices partial improvement in skin but no difference in joint pain so far. She also had influenza B infection last month that was very painful and draining, and also felt terrible on the tamiflu treatment for it. Still has persistent low back pain and bilateral hip pain.   Previous HPI 05/29/22 RAKSHA WOLFGANG is a 37 y.o. 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 Updated xrays at initial visit demonstrate bilateral SI joint changes but not specific for inflammatory process. Labs were negative for increased inflammatory markers. Normal baseline screening labs for DMARD treatment. She has not seen any major difference with flexeril for  her night and morning back pain. Facial rosacea rash is a bit worse than a month ago.   Previous HPI 04/23/22 MATRACA HUNKINS is a 37 y.o. 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  Back pain mostly became a significant problem around 2017 following a pregnancy at the time.  Back pain and stiffness frequently with nighttime awakenings and symptoms most prominent in the mornings concerning for inflammatory arthritis.  Previously seen in [facility] rheumatology but not following up since 2020. She was previously treated with prescription NSAIDs and did physical therapy for back in 2018.  She had local steroid injections to the SI joints but does not recall an impressive amount of symptom benefit from this.  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  She never saw a large improvement in symptoms with the Humira but only ever continue this for a few months before interrupting.  Short-term treatments with prednisone for various indications along the way have not usually caused a dramatic change in back pain severity.  She is not on any muscle relaxer type medication long-term, has taken this for neck pain issues as well as prednisone and was not sure if this had a large impact in her back pain.  She takes mainly Tylenol arthritis as needed for pain which is mildly helpful. She also sees dermatology for problems including  rosacea and psoriasis previously prescribed just topical medication.  She has had psoriasis for many years mostly rashes on the scalp and arms this has never been a severe problem is currently pretty well controlled on the topical medication.   Labs reivewed 12/2018 HBV neg  03/2018  ANA neg dsDNA neg CCP neg ESR 19 CRP 16.8   07/2016 HLA-B27 neg HCV neg   Imaging reviewed 12/31/17 MRI PELVIS W WO CONTRAST MSK IMPRESSION:  Findings most consistent with chronic  nonactive sacroiliitis, similar to the prior exam. No evidence of active sacroiliitis. Low-lying intrauterine device which appears to be at least partially within the cervical canal. This is suboptimally evaluated and pelvic ultrasound may be performed for further characterization.    DMARD Hx Humira - lack of response   Review of Systems  Constitutional:  Positive for fatigue.  HENT:  Negative for mouth sores and mouth dryness.   Eyes:  Negative for dryness.  Respiratory:  Negative for shortness of breath.   Cardiovascular:  Positive for chest pain. Negative for palpitations.  Gastrointestinal:  Negative for blood in stool, constipation and diarrhea.  Endocrine: Negative for increased urination.  Genitourinary:  Positive for involuntary urination.  Musculoskeletal:  Positive for joint pain, gait problem, joint pain, joint swelling, myalgias, muscle weakness, morning stiffness, muscle tenderness and myalgias.  Skin:  Positive for sensitivity to sunlight. Negative for color change, rash and hair loss.  Allergic/Immunologic: Positive for susceptible to infections.  Neurological:  Positive for dizziness and headaches.  Hematological:  Positive for swollen glands.  Psychiatric/Behavioral:  Negative for depressed mood and sleep disturbance. The patient is not nervous/anxious.     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   Hypokalemia 01/01/2019   Elevated hemoglobin A1c 02/12/2018   Dyslipidemia 02/05/2018   Abnormal skin growth 11/15/2016   PTSD (post-traumatic stress disorder) 10/24/2016    Severe bipolar I disorder, most recent episode depressed without psychotic features (HCC) 10/23/2016   Ankylosing spondylitis (HCC) 10/23/2016   Sacroiliac inflammation (HCC) 08/02/2016   Hypertension 05/28/2016   Vitamin D deficiency 05/02/2016   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   Hypothyroidism 02/13/2016   Anxiety and depression 01/23/2016   Status post tubal ligation 09/22/2015   Morbid obesity with BMI of 50.0-59.9, adult (HCC) 04/25/2015   Anemia, iron deficiency 01/04/2015   Headache, migraine 01/04/2015   Abnormal cytological findings in female genital organs 07/04/2011    Past Medical History:  Diagnosis Date   Abnormal Pap smear of cervix    Arthritis    Arthritis, rheumatoid (HCC)    Atypical chest pain    Bipolar 1 disorder (HCC)    Headache    MIGRAINES   Hyperlipidemia    Hypertension    Intractable nausea and vomiting 01/10/2019   Morbid obesity (HCC)    a. 11/2018 s/p Robotic Sleeve Gastrectomy @ [facility].   Osteoarthritis    Palpitations    a. 09/2017 Zio Monitor: predominantly RSR, avg HR 87, 11 A tach/SVT episodes, fastest 176 x 7 beats, longest 9 beats. Rare PAC's, occas PVC's, rare couplets/triplets.   Persistent Nausea and vomiting  a. Following sleeve gastrectomy 11/2018   PTSD (post-traumatic stress disorder)    PVC (premature ventricular contraction)    Sacroiliitis (HCC)    Social phobia    Stroke [facility])     Family History  Problem Relation Age of Onset   Rheum arthritis Mother    Healthy Father    Healthy Sister    Healthy Brother    Diabetes Maternal Grandmother    Heart disease Maternal Grandmother    Heart failure Maternal Grandmother    Heart attack Maternal Grandmother    Heart Problems Maternal Grandfather    Stroke Maternal Grandfather    Atrial fibrillation Paternal Grandmother    Stroke Other        mat great gf had stroke at 103   Atrial fibrillation Daughter    Atrial fibrillation Son    Cancer Neg  Hx    Past Surgical History:  Procedure Laterality Date   CHOLECYSTECTOMY     LAPAROSCOPIC GASTRIC SLEEVE RESECTION  11/19/2018   LAPAROSCOPIC TUBAL LIGATION Bilateral 09/19/2015   Procedure: LAPAROSCOPIC BILATERAL TUBAL BANDING;  Surgeon: Herold Harms, MD;  Location: [facility] ORS;  Service: Gynecology;  Laterality: Bilateral;   ROBOTIC ASSISTED TOTAL HYSTERECTOMY Bilateral 08/28/2021   Procedure: XI ROBOTIC ASSISTED HYSTERECTOMY, BILATERAL SALPINGECTOMY;  Surgeon: Hildred Laser, MD;  Location: [facility] ORS;  Service: Gynecology;  Laterality: Bilateral;   Social History   Social History Narrative   Lives in [place] with husband   Right handed   Immunization History  Administered Date(s) Administered   Influenza, Seasonal, Injecte, Preservative Fre 05/30/2011   Influenza,inj,Quad PF,6+ Mos 03/04/2014, 02/22/2015, 04/17/2018, 03/12/2022   Influenza-Unspecified 03/18/2016   MMR 06/15/2015   Pneumococcal Conjugate-13 09/07/2016   Pneumococcal Polysaccharide-23 12/11/2016   Tdap 10/19/2011, 03/21/2015, 11/15/2016     Objective: Vital Signs: BP (!) 145/88 (BP Location: Left Arm, Patient Position: Sitting, Cuff Size: Normal)   Pulse 72   Resp 16   Ht 5\' 1"  (1.549 m)   Wt 277 lb (125.6 kg)   LMP 06/27/2021 (Approximate)   BMI 52.34 kg/m    Physical Exam Eyes:     Conjunctiva/sclera: Conjunctivae normal.  Cardiovascular:     Rate and Rhythm: Normal rate and regular rhythm.  Pulmonary:     Effort: Pulmonary effort is normal.     Breath sounds: Normal breath sounds.  Musculoskeletal:     Right lower leg: No edema.     Left lower leg: No edema.  Lymphadenopathy:     Cervical: No cervical adenopathy.  Skin:    General: Skin is warm and dry.  Neurological:     Mental Status: She is alert.  Psychiatric:        Mood and Affect: Mood normal.      Musculoskeletal Exam:  Shoulders full ROM no tenderness or swelling Elbows full ROM no tenderness or swelling Wrists full ROM  no tenderness or swelling Fingers full ROM no tenderness or swelling Tenderness to pressure at neck and upper back worse paraspinal muscles Low back tenderness both sides, over right SI joint and right lateral hip Knees full ROM no tenderness or swelling Ankles full ROM no tenderness or swelling  Investigation: No additional findings.  Imaging: No results found.  Recent Labs: Lab Results  Component Value Date   WBC 8.6 11/28/2022   HGB 12.6 11/28/2022   PLT 367 11/28/2022   NA 141 11/28/2022   K 3.9 11/28/2022   CL 106 11/28/2022   CO2 27 11/28/2022   GLUCOSE  90 11/28/2022   BUN 10 11/28/2022   CREATININE 0.73 11/28/2022   BILITOT 0.4 11/28/2022   ALKPHOS 108 11/01/2022   AST 14 11/28/2022   ALT 18 11/28/2022   PROT 6.3 11/28/2022   ALBUMIN 4.3 11/01/2022   CALCIUM 9.0 11/28/2022   GFRAA 117 02/19/2019   QFTBGOLDPLUS Negative 04/25/2022    Speciality Comments: No specialty comments available.  Procedures:  No procedures performed Allergies: Patient has no known allergies.   Assessment / Plan:     Visit Diagnoses: Ankylosing spondylitis of multiple sites in spine [facility])  Sacroiliac inflammation (HCC) - Plan: Sedimentation rate  So far were not seeing a very appreciable difference in back pain after addition of Taltz which she is taking consistently for at least 3 months.  Will recheck the sedimentation rate.  Possible symptoms may be explained by degenerative changes including osteitis condensans ilii as a cause of sclerosis on xray.  If inflammatory markers still unremarkable and with his lack of benefit will probably need to get MRI of the SI joints to try to confirm whether there is really active inflammatory disease.  Psoriasis vulgaris  Skin disease doing very well on Toltz.  High risk medication use - Plan: CBC with Differential/Platelet, COMPLETE METABOLIC PANEL WITH GFR  Checking CBC and CMP medication monitoring continuing use of Toltz.  Previous  QuantiFERON reviewed was negative.  She not had any medication intolerance no serious interval infections.  Orders: Orders Placed This Encounter  Procedures   Sedimentation rate   CBC with Differential/Platelet   COMPLETE METABOLIC PANEL WITH GFR   No orders of the defined types were placed in this encounter.    Follow-Up Instructions: No follow-ups on file.   Fuller Plan, MD  Note - This record has been created using [employer].  Chart creation errors have been sought, but may not always  have been located. Such creation errors do not reflect on  the standard of medical care.

## 2022-11-29 LAB — COMPLETE METABOLIC PANEL WITH GFR
AG Ratio: 1.7 (calc) (ref 1.0–2.5)
ALT: 18 U/L (ref 6–29)
AST: 14 U/L (ref 10–30)
Albumin: 4 g/dL (ref 3.6–5.1)
Alkaline phosphatase (APISO): 94 U/L (ref 31–125)
BUN: 10 mg/dL (ref 7–25)
CO2: 27 mmol/L (ref 20–32)
Calcium: 9 mg/dL (ref 8.6–10.2)
Chloride: 106 mmol/L (ref 98–110)
Creat: 0.73 mg/dL (ref 0.50–0.97)
Globulin: 2.3 g/dL (calc) (ref 1.9–3.7)
Glucose, Bld: 90 mg/dL (ref 65–99)
Potassium: 3.9 mmol/L (ref 3.5–5.3)
Sodium: 141 mmol/L (ref 135–146)
Total Bilirubin: 0.4 mg/dL (ref 0.2–1.2)
Total Protein: 6.3 g/dL (ref 6.1–8.1)
eGFR: 111 mL/min/{1.73_m2} (ref >=60)

## 2022-11-29 LAB — CBC WITH DIFFERENTIAL/PLATELET
Absolute Monocytes: 1032 cells/uL — ABNORMAL HIGH (ref 200–950)
Basophils Absolute: 9 cells/uL (ref 0–200)
Basophils Relative: 0.1 %
Eosinophils Absolute: 17 cells/uL (ref 15–500)
Eosinophils Relative: 0.2 %
HCT: 39.2 % (ref 35.0–45.0)
Hemoglobin: 12.6 g/dL (ref 11.7–15.5)
Lymphs Abs: 1961 cells/uL (ref 850–3900)
MCH: 26.6 pg — ABNORMAL LOW (ref 27.0–33.0)
MCHC: 32.1 g/dL (ref 32.0–36.0)
MCV: 82.7 fL (ref 80.0–100.0)
MPV: 9.5 fL (ref 7.5–12.5)
Monocytes Relative: 12 %
Neutro Abs: 5581 cells/uL (ref 1500–7800)
Neutrophils Relative %: 64.9 %
Platelets: 367 10*3/uL (ref 140–400)
RBC: 4.74 10*6/uL (ref 3.80–5.10)
RDW: 14.5 % (ref 11.0–15.0)
Total Lymphocyte: 22.8 %
WBC: 8.6 10*3/uL (ref 3.8–10.8)

## 2022-11-29 LAB — SEDIMENTATION RATE: Sed Rate: 14 mm/h (ref 0–20)

## 2022-12-13 ENCOUNTER — Other Ambulatory Visit: Payer: Self-pay | Admitting: Dermatology

## 2022-12-13 DIAGNOSIS — L409 Psoriasis, unspecified: Secondary | ICD-10-CM

## 2022-12-17 ENCOUNTER — Encounter: Payer: Self-pay | Admitting: Family Medicine

## 2022-12-17 DIAGNOSIS — Z79899 Other long term (current) drug therapy: Secondary | ICD-10-CM

## 2022-12-17 DIAGNOSIS — F314 Bipolar disorder, current episode depressed, severe, without psychotic features: Secondary | ICD-10-CM

## 2022-12-20 ENCOUNTER — Other Ambulatory Visit: Payer: Self-pay | Admitting: Family Medicine

## 2022-12-20 LAB — LITHIUM LEVEL: Lithium Lvl: 0.4 mmol/L — ABNORMAL LOW (ref 0.5–1.2)

## 2022-12-21 NOTE — Telephone Encounter (Signed)
Requested Prescriptions  Pending Prescriptions Disp Refills   WEGOVY 1.7 MG/0.75ML SOAJ [Pharmacy Med Name: WEGOVY 1.7MG /0.75ML INJ ( 4 PENS)] 9 mL 0    Sig: INJECT 1.7 MG INTO THE SKIN ONCE A WEEK     Endocrinology:  Diabetes - GLP-1 Receptor Agonists - semaglutide Failed - 12/20/2022  9:57 AM      Failed - HBA1C in normal range and within 180 days    Hemoglobin A1C  Date Value Ref Range Status  08/01/2022 6.4 (A) 4.0 - 5.6 % Final  01/24/2022 6.2  Final         Passed - Cr in normal range and within 360 days    Creat  Date Value Ref Range Status  11/28/2022 0.73 0.50 - 0.97 mg/dL Final   Creatinine, Urine  Date Value Ref Range Status  04/08/2015 92 mg/dL Final         Passed - Valid encounter within last 6 months    Recent Outpatient Visits           3 weeks ago Acute non-recurrent frontal sinusitis   [place] [facility] Smitty Cords, DO   1 month ago RUQ abdominal pain   [place] [facility] Smitty Cords, DO   2 months ago Ankylosing spondylitis of multiple sites in spine [facility])   [place] [facility] [place], Netta Neat, DO   4 months ago Influenza B   [facility] [place], Netta Neat, DO   4 months ago Elevated hemoglobin A1c    [facility] Smitty Cords, DO       Future Appointments             In 2 months Rice, Jamesetta Orleans, MD [facility]umat[facility]

## 2022-12-23 ENCOUNTER — Other Ambulatory Visit: Payer: Self-pay | Admitting: Family Medicine

## 2022-12-23 DIAGNOSIS — I6930 Unspecified sequelae of cerebral infarction: Secondary | ICD-10-CM

## 2022-12-24 NOTE — Telephone Encounter (Signed)
Requested Prescriptions  Pending Prescriptions Disp Refills   atorvastatin (LIPITOR) 40 MG tablet [Pharmacy Med Name: Atorvastatin Calcium 40 MG Oral Tablet] 90 tablet 0    Sig: TAKE 1 TABLET BY MOUTH DAILY     Cardiovascular:  Antilipid - Statins Failed - 12/23/2022  5:15 AM      Failed - Lipid Panel in normal range within the last 12 months    Cholesterol, Total  Date Value Ref Range Status  11/09/2020 92 (L) 100 - 199 mg/dL Final   Cholesterol  Date Value Ref Range Status  01/24/2022 110 0 - 200 Final   LDL Cholesterol (Calc)  Date Value Ref Range Status  10/14/2018 93 mg/dL (calc) Final    Comment:    Reference range: <100 . Desirable range <100 mg/dL for primary prevention;   <70 mg/dL for patients with CHD or diabetic patients  with > or = 2 CHD risk factors. Marland Kitchen LDL-C is now calculated using the Martin-Hopkins  calculation, which is a validated novel method providing  better accuracy than the Friedewald equation in the  estimation of LDL-C.  Horald Pollen et al. Lenox Ahr. 1610;960(45): 2061-2068  (http://education.[employer].com/faq/FAQ164)    LDL Chol Calc (NIH)  Date Value Ref Range Status  11/09/2020 47 0 - 99 mg/dL Final   LDL Cholesterol  Date Value Ref Range Status  01/24/2022 61  Final   HDL  Date Value Ref Range Status  01/24/2022 33 (A) 35 - 70 Final  11/09/2020 28 (L) >39 mg/dL Final   Triglycerides  Date Value Ref Range Status  01/24/2022 79 40 - 160 Final         Passed - Patient is not pregnant      Passed - Valid encounter within last 12 months    Recent Outpatient Visits           4 weeks ago Acute non-recurrent frontal sinusitis   [place] [facility] [place], Netta Neat, DO   1 month ago RUQ abdominal pain   [place] [facility] [place], Netta Neat, DO   2 months ago Ankylosing spondylitis of multiple sites in spine [facility])   [place] [facility] [place], Netta Neat,  DO   4 months ago Influenza B   [place] [facility] [place], Netta Neat, DO   4 months ago Elevated hemoglobin A1c   [place] [facility] Smitty Cords, DO       Future Appointments             In 2 months Rice, Jamesetta Orleans, MD [facility]

## 2022-12-28 ENCOUNTER — Encounter: Payer: Self-pay | Admitting: Family Medicine

## 2023-01-03 ENCOUNTER — Other Ambulatory Visit: Payer: Self-pay | Admitting: Family Medicine

## 2023-01-03 DIAGNOSIS — G8929 Other chronic pain: Secondary | ICD-10-CM

## 2023-01-03 DIAGNOSIS — M450 Ankylosing spondylitis of multiple sites in spine: Secondary | ICD-10-CM

## 2023-01-04 NOTE — Telephone Encounter (Signed)
Requested medications are due for refill today.  yes  Requested medications are on the active medications list.  yes  Last refill. 11/03/2022 330 0 rf  Future visit scheduled.   no  Notes to clinic.  Refill not delegated.    Requested Prescriptions  Pending Prescriptions Disp Refills   traMADol (ULTRAM) 50 MG tablet [Pharmacy Med Name: TRAMADOL 50MG  TABLETS] 30 tablet     Sig: TAKE 1 TABLET(50 MG) BY MOUTH EVERY 8 HOURS AS NEEDED     Not Delegated - Analgesics:  Opioid Agonists Failed - 01/03/2023  3:27 PM      Failed - This refill cannot be delegated      Failed - Urine Drug Screen completed in last 360 days      Passed - Valid encounter within last 3 months    Recent Outpatient Visits           1 month ago Acute non-recurrent frontal sinusitis   [facility] Smitty Cords, DO   2 months ago RUQ abdominal pain   [place] [facility] [place], Netta Neat, DO   3 months ago Ankylosing spondylitis of multiple 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

## 2023-01-09 ENCOUNTER — Encounter: Payer: Self-pay | Admitting: Family Medicine

## 2023-01-09 DIAGNOSIS — F314 Bipolar disorder, current episode depressed, severe, without psychotic features: Secondary | ICD-10-CM

## 2023-01-09 DIAGNOSIS — E039 Hypothyroidism, unspecified: Secondary | ICD-10-CM

## 2023-01-10 ENCOUNTER — Other Ambulatory Visit: Payer: Self-pay

## 2023-01-10 DIAGNOSIS — F314 Bipolar disorder, current episode depressed, severe, without psychotic features: Secondary | ICD-10-CM

## 2023-01-10 DIAGNOSIS — E039 Hypothyroidism, unspecified: Secondary | ICD-10-CM

## 2023-01-10 NOTE — Addendum Note (Signed)
Addended by: Judd Gaudier on: 01/10/2023 09:28 AM   Modules accepted: Orders

## 2023-02-05 ENCOUNTER — Other Ambulatory Visit: Payer: Self-pay | Admitting: Internal Medicine

## 2023-02-05 NOTE — Telephone Encounter (Signed)
Last Fill: 11/08/2022  Labs: 12/07/2022 Lab results no problem with the Taltz blood count metabolic panel are normal.   TB Gold: 04/25/2022 Neg    Next Visit: 03/04/2023  Last Visit: 08/28/2022  DX:Ankylosing spondylitis of multiple sites in spine   Current Dose per office note 11/28/2022: dose on discussed  Okay to refill Taltz?

## 2023-02-07 ENCOUNTER — Encounter: Payer: Self-pay | Admitting: Family Medicine

## 2023-02-07 ENCOUNTER — Ambulatory Visit (INDEPENDENT_AMBULATORY_CARE_PROVIDER_SITE_OTHER): Payer: Managed Care, Other (non HMO) | Admitting: Family Medicine

## 2023-02-07 VITALS — BP 122/74 | HR 65 | Ht 61.0 in | Wt 270.0 lb

## 2023-02-07 DIAGNOSIS — Z0000 Encounter for general adult medical examination without abnormal findings: Secondary | ICD-10-CM | POA: Diagnosis not present

## 2023-02-07 DIAGNOSIS — E039 Hypothyroidism, unspecified: Secondary | ICD-10-CM | POA: Diagnosis not present

## 2023-02-07 DIAGNOSIS — F314 Bipolar disorder, current episode depressed, severe, without psychotic features: Secondary | ICD-10-CM

## 2023-02-07 DIAGNOSIS — R7309 Other abnormal glucose: Secondary | ICD-10-CM

## 2023-02-07 DIAGNOSIS — Z6841 Body Mass Index (BMI) 40.0 and over, adult: Secondary | ICD-10-CM

## 2023-02-07 NOTE — Patient Instructions (Addendum)
Thank you for coming to the office today.  Keep up the great work I will stay tuned for the surgical updates in September  Flu Vaccine when ready.  Labs today, A1c + Lipid  Continue with current medicine regimen with Lithium and follow up as planned.  Please schedule a Follow-up Appointment to: Return in about 1 year (around 02/07/2024) for 1 year Annual Physical, [facility] orders.  If you have any other questions or concerns, please feel free to call the office or send a message through MyChart. You may also schedule an earlier appointment if necessary.  Additionally, you may be receiving a survey about your experience at our office within a few days to 1 week by e-mail or mail. We value your feedback.  Saralyn Pilar, DO [facility], [place]

## 2023-02-07 NOTE — Progress Notes (Signed)
Subjective:    Patient ID: Leah Osborne, female    DOB: 10/05/1985, 37 y.o.   MRN: [id number]  Leah Osborne is a 37 y.o. female presenting on 02/07/2023 for Annual Exam   HPI  Here for Annual Physical and lab Review   Morbid Obesity Followed by [facility] Has upcoming surgery Lap BPD-DS and Hiatal Hernia repair  HYPERLIPIDEMIA: - Reports no concerns. Last lipid panel 01/24/22, controlled  Due for lipid panel today - Currently taking Statin therapy, tolerating well without side effects or myalgias   Bipolar Followed by Psych Doing very well On Lithium 300mg  BID    Health Maintenance: Due flu shot when ready     02/07/2023    8:47 AM 11/26/2022    3:38 PM 10/05/2022    3:25 PM  Depression screen PHQ 2/9  Decreased Interest 0 0 0  Down, Depressed, Hopeless 0 0 0  PHQ - 2 Score 0 0 0  Altered sleeping 0 0 0  Tired, decreased energy 0 0 2  Change in appetite 0 0 0  Feeling bad or failure about yourself  0 0 0  Trouble concentrating 0 0 1  Moving slowly or fidgety/restless 0 0 0  Suicidal thoughts 0 0 0  PHQ-9 Score 0 0 3  Difficult doing work/chores Not difficult at all Not difficult at all Somewhat difficult    Past Medical History:  Diagnosis Date   Abnormal Pap smear of cervix    Arthritis    Arthritis, rheumatoid (HCC)    Atypical chest pain    Bipolar 1 disorder (HCC)    Headache    MIGRAINES   Hyperlipidemia    Hypertension    Intractable nausea and vomiting 01/10/2019   Morbid obesity (HCC)    a. 11/2018 s/p Robotic Sleeve Gastrectomy @ [facility].   Osteoarthritis    Palpitations    a. 09/2017 Zio Monitor: predominantly RSR, avg HR 87, 11 A tach/SVT episodes, fastest 176 x 7 beats, longest 9 beats. Rare PAC's, occas PVC's, rare couplets/triplets.   Persistent Nausea and vomiting    a. Following sleeve gastrectomy 11/2018   PTSD (post-traumatic stress disorder)    PVC (premature ventricular contraction)    Sacroiliitis (HCC)    Social  phobia    Stroke [facility])    Past Surgical History:  Procedure Laterality Date   CHOLECYSTECTOMY     LAPAROSCOPIC GASTRIC SLEEVE RESECTION  11/19/2018   LAPAROSCOPIC TUBAL LIGATION Bilateral 09/19/2015   Procedure: LAPAROSCOPIC BILATERAL TUBAL BANDING;  Surgeon: Herold Harms, MD;  Location: [facility] ORS;  Service: Gynecology;  Laterality: Bilateral;   ROBOTIC ASSISTED TOTAL HYSTERECTOMY Bilateral 08/28/2021   Procedure: XI ROBOTIC ASSISTED HYSTERECTOMY, BILATERAL SALPINGECTOMY;  Surgeon: Hildred Laser, MD;  Location: [facility] ORS;  Service: Gynecology;  Laterality: Bilateral;   Social History   Socioeconomic History   Marital status: Married    Spouse name: Not on file   Number of children: Not on file   Years of education: Not on file   Highest education level: Some college, no degree  Occupational History   Not on file  Tobacco Use   Smoking status: Never    Passive exposure: Never   Smokeless tobacco: Never  Vaping Use   Vaping status: Never Used  Substance and Sexual Activity   Alcohol use: Not Currently    Alcohol/week: 0.0 standard drinks of alcohol   Drug use: No   Sexual activity: Yes    Birth control/protection: Surgical  Comment: tubal  Other Topics Concern   Not on file  Social History Narrative   Lives in [place] with husband   Right handed   Social Determinants of Health   Financial Resource Strain: Low Risk  (10/05/2022)   Overall Financial Resource Strain (CARDIA)    Difficulty of Paying Living Expenses: Not hard at all  Food Insecurity: No Food Insecurity (10/05/2022)   Hunger Vital Sign    Worried About Running Out of Food in the Last Year: Never true    Ran Out of Food in the Last Year: Never true  Transportation Needs: No Transportation Needs (10/05/2022)   PRAPARE - Administrator, Civil Service (Medical): No    Lack of Transportation (Non-Medical): No  Physical Activity: Sufficiently Active (10/05/2022)   Exercise Vital Sign    Days  of Exercise per Week: 4 days    Minutes of Exercise per Session: 90 min  Stress: Stress Concern Present (10/05/2022)   [employer] of Occupational Health - Occupational Stress Questionnaire    Feeling of Stress : To some extent  Social Connections: Moderately Integrated (10/05/2022)   Social Connection and Isolation Panel [NHANES]    Frequency of Communication with Friends and Family: More than three times a week    Frequency of Social Gatherings with Friends and Family: Once a week    Attends Religious Services: 1 to 4 times per year    Active Member of [employer] or Organizations: No    Attends Engineer, structural: Not on file    Marital Status: Married  Intimate Partner Violence: Unknown (09/22/2021)   Received from [facility]   HITS    Physically Hurt: Not on file    Insult or Talk Down To: Not on file    Threaten Physical Harm: Not on file    Scream or Curse: Not on file   Family History  Problem Relation Age of Onset   Rheum arthritis Mother    Healthy Father    Healthy Sister    Healthy Brother    Diabetes Maternal Grandmother    Heart disease Maternal Grandmother    Heart failure Maternal Grandmother    Heart attack Maternal Grandmother    Heart Problems Maternal Grandfather    Stroke Maternal Grandfather    Atrial fibrillation Paternal Grandmother    Stroke Other        mat great gf had stroke at 33   Atrial fibrillation Daughter    Atrial fibrillation Son    Cancer Neg Hx    Current Outpatient Medications on File Prior to Visit  Medication Sig   acetaminophen (TYLENOL) 500 MG tablet Take 1,000 mg by mouth every 6 (six) hours as needed for moderate pain or headache.   albuterol (VENTOLIN HFA) 108 (90 Base) MCG/ACT inhaler Inhale 2 puffs into the lungs every 6 (six) hours as needed.   ASPIRIN LOW DOSE 81 MG tablet TAKE 1 TABLET BY MOUTH  DAILY (SWALLOW WHOLE)   atorvastatin (LIPITOR) 40 MG tablet TAKE 1 TABLET BY MOUTH DAILY   baclofen (LIORESAL) 10 MG  tablet Take 0.5-1 tablets (5-10 mg total) by mouth 3 (three) times daily as needed for muscle spasms.   benzonatate (TESSALON) 200 MG capsule Take 1 capsule (200 mg total) by mouth 3 (three) times daily as needed for cough.   Clobetasol Propionate 0.05 % shampoo APPLY TOPICALLY TO DRY SCALP 3  TIMES WEEKLY AND LET SIT 15  MINUTES AND RINSE OUT   ferrous  gluconate (FERGON) 324 MG tablet Take by mouth.   fluocinonide (LIDEX) 0.05 % external solution Apply 1 application  topically 2 (two) times daily as needed (psoriasis).   fluticasone (FLONASE) 50 MCG/ACT nasal spray USE TWO SPRAYS IN BOTH NOSTRILS DAILY. USE OR FOUR TO SIX WEEKS. THEN STOP AND USE SEASONALLY OR AS NEEDED.   Ivermectin 1 % CREA Apply 1 Application topically every morning.   levothyroxine (SYNTHROID) 25 MCG tablet TAKE 1 TABLET BY MOUTH DAILY  BEFORE BREAKFAST   lithium carbonate (LITHOBID) 300 MG ER tablet Take 300 mg by mouth 2 (two) times daily.   lumateperone tosylate (CAPLYTA) 42 MG capsule Take 42 mg by mouth daily.   mirabegron ER (MYRBETRIQ) 50 MG TB24 tablet Take 1 tablet (50 mg total) by mouth daily.   ondansetron (ZOFRAN-ODT) 4 MG disintegrating tablet Take 1 tablet (4 mg total) by mouth every 8 (eight) hours as needed for nausea or vomiting.   spironolactone (ALDACTONE) 25 MG tablet TAKE 1 TABLET BY MOUTH DAILY   TALTZ 80 MG/ML SOAJ INJECT 80MG  SUBCUTANEOUSLY EVERY 4 WEEKS   traMADol (ULTRAM) 50 MG tablet Take 1 tablet (50 mg total) by mouth every 8 (eight) hours as needed.   Vitamin D, Ergocalciferol, (DRISDOL) 1.25 MG (50000 UNIT) CAPS capsule Take 50,000 Units by mouth once a week.   WEGOVY 1.7 MG/0.75ML SOAJ INJECT 1.7 MG INTO THE SKIN ONCE A WEEK   No current facility-administered medications on file prior to visit.    Review of Systems  Constitutional:  Negative for activity change, appetite change, chills, diaphoresis, fatigue and fever.  HENT:  Negative for congestion and hearing loss.   Eyes:  Negative for  visual disturbance.  Respiratory:  Negative for cough, chest tightness, shortness of breath and wheezing.   Cardiovascular:  Negative for chest pain, palpitations and leg swelling.  Gastrointestinal:  Negative for abdominal pain, constipation, diarrhea, nausea and vomiting.  Genitourinary:  Negative for dysuria, frequency and hematuria.  Musculoskeletal:  Positive for arthralgias. Negative for neck pain.  Skin:  Negative for rash.  Neurological:  Negative for dizziness, weakness, light-headedness, numbness and headaches.  Hematological:  Negative for adenopathy.  Psychiatric/Behavioral:  Negative for behavioral problems, dysphoric mood and sleep disturbance.    Per HPI unless specifically indicated above      Objective:    BP 122/74   Pulse 65   Ht 5\' 1"  (1.549 m)   Wt 270 lb (122.5 kg)   LMP 06/27/2021 (Approximate)   SpO2 99%   BMI 51.02 kg/m   Wt Readings from Last 3 Encounters:  02/07/23 270 lb (122.5 kg)  11/28/22 277 lb (125.6 kg)  11/26/22 278 lb 12.8 oz (126.5 kg)    Physical Exam Vitals and nursing note reviewed.  Constitutional:      General: She is not in acute distress.    Appearance: She is well-developed. She is obese. She is not diaphoretic.     Comments: Well-appearing, comfortable, cooperative  HENT:     Head: Normocephalic and atraumatic.  Eyes:     General:        Right eye: No discharge.        Left eye: No discharge.     Conjunctiva/sclera: Conjunctivae normal.     Pupils: Pupils are equal, round, and reactive to light.  Neck:     Thyroid: No thyromegaly.  Cardiovascular:     Rate and Rhythm: Normal rate and regular rhythm.     Pulses: Normal pulses.  Heart sounds: Normal heart sounds. No murmur heard. Pulmonary:     Effort: Pulmonary effort is normal. No respiratory distress.     Breath sounds: Normal breath sounds. No wheezing or rales.  Abdominal:     General: Bowel sounds are normal. There is no distension.     Palpations: Abdomen is  soft. There is no mass.     Tenderness: There is no abdominal tenderness.  Musculoskeletal:        General: No tenderness. Normal range of motion.     Cervical back: Normal range of motion and neck supple.     Comments: Upper / Lower Extremities: - Normal muscle tone, strength bilateral upper extremities 5/5, lower extremities 5/5  Lymphadenopathy:     Cervical: No cervical adenopathy.  Skin:    General: Skin is warm and dry.     Findings: No erythema or rash.  Neurological:     Mental Status: She is alert and oriented to person, place, and time.     Comments: Distal sensation intact to light touch all extremities  Psychiatric:        Mood and Affect: Mood normal.        Behavior: Behavior normal.        Thought Content: Thought content normal.     Comments: Well groomed, good eye contact, normal speech and thoughts       Results for orders placed or performed in visit on 01/10/23  Basic metabolic panel  Result Value Ref Range   Glucose 82 70 - 99 mg/dL   BUN 9 6 - 20 mg/dL   Creatinine, Ser 5.62 0.57 - 1.00 mg/dL   eGFR 130 >86 VH/QIO/9.62   BUN/Creatinine Ratio 12 9 - 23   Sodium 143 134 - 144 mmol/L   Potassium 4.1 3.5 - 5.2 mmol/L   Chloride 106 96 - 106 mmol/L   CO2 24 20 - 29 mmol/L   Calcium 9.5 8.7 - 10.2 mg/dL  TSH  Result Value Ref Range   TSH 3.820 0.450 - 4.500 uIU/mL      Assessment & Plan:   Problem List Items Addressed This Visit     Elevated hemoglobin A1c   Relevant Orders   Hemoglobin A1c   Hypothyroidism   Morbid obesity with BMI of 50.0-59.9, adult (HCC)   Relevant Orders   Lipid panel   Severe bipolar I disorder, most recent episode depressed without psychotic features (HCC)   Other Visit Diagnoses     Annual physical exam    -  Primary   Relevant Orders   Hemoglobin A1c   Lipid panel       Updated Health Maintenance information Reviewed recent lab results with patient Encouraged improvement to lifestyle with diet and  exercise Goal of weight loss  Keep up the great work I will stay tuned for the surgical updates in September  Flu Vaccine when ready.  Labs today, A1c + Lipid  Continue with current medicine regimen with Lithium and follow up as planned.  Orders Placed This Encounter  Procedures   Hemoglobin A1c   Lipid panel    Order Specific Question:   Has the patient fasted?    Answer:   Yes     No orders of the defined types were placed in this encounter.     Follow up plan: Return in about 1 year (around 02/07/2024) for 1 year Annual Physical, [facility] orders.    Saralyn Pilar, DO [facility] 02/07/2023, 9:01 AM

## 2023-02-08 LAB — HEMOGLOBIN A1C
Est. average glucose Bld gHb Est-mCnc: 108 mg/dL
Hgb A1c MFr Bld: 5.4 % (ref 4.8–5.6)

## 2023-02-08 LAB — LIPID PANEL
Chol/HDL Ratio: 2.6 ratio (ref 0.0–4.4)
Cholesterol, Total: 83 mg/dL — ABNORMAL LOW (ref 100–199)
HDL: 32 mg/dL — ABNORMAL LOW (ref >39)
LDL Chol Calc (NIH): 34 mg/dL (ref 0–99)
Triglycerides: 82 mg/dL (ref 0–149)
VLDL Cholesterol Cal: 17 mg/dL (ref 5–40)

## 2023-02-21 NOTE — Progress Notes (Deleted)
Office Visit Note  Patient: Leah Osborne             Date of Birth: [birth year]           MRN: [id number]             PCP: Smitty Cords, DO Referring: Saralyn Pilar * Visit Date: 03/04/2023   Subjective:  No chief complaint on file.   History of Present Illness: Leah Osborne is a 37 y.o. 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    Previous HPI 11/28/2022 Leah Osborne is a 37 y.o. 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  Her skin rashes are doing very well.  Back pain is not doing well.  She has at least 2 flareups of increased pain since her last visit in March.  She had a negative experience calling about symptoms in April related to this.  She started softball coaching which was a large increase in her amount of running and physical activity overall.  Subsequently had exacerbation of low back pain.  This improved with tramadol and muscle relaxer medication.  Still having a fair amount of symptoms daily with low back pain and stiffness.   Previous HPI 08/28/22 Leah Osborne is a 37 y.o. 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 Due to initial delay with medication approval she started February 1st so only on treatment about 6 weeks. So far she notices partial improvement in skin but no difference in joint pain so far. She also had influenza B infection last month that was very painful and draining, and also felt terrible on the tamiflu treatment for it. Still has persistent low back pain and bilateral hip pain.   Previous HPI 05/29/22 Leah Osborne is a 37 y.o. 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 Updated xrays at initial visit demonstrate bilateral SI joint changes but not specific for  inflammatory process. Labs were negative for increased inflammatory markers. Normal baseline screening labs for DMARD treatment. She has not seen any major difference with flexeril for her night and morning back pain. Facial rosacea rash is a bit worse than a month ago.   Previous HPI 04/23/22 Leah Osborne is a 37 y.o. 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  Back pain mostly became a significant problem around 2017 following a pregnancy at the time.  Back pain and stiffness frequently with nighttime awakenings and symptoms most prominent in the mornings concerning for inflammatory arthritis.  Previously seen in [facility] rheumatology but not following up since 2020. She was previously treated with prescription NSAIDs and did physical therapy for back in 2018.  She had local steroid injections to the SI joints but does not recall an impressive amount of symptom benefit from this.  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  She never saw a large improvement in symptoms with the Humira but only ever continue this for a few months before interrupting.  Short-term treatments with prednisone for various indications along the way have not usually caused a dramatic change in back pain severity.  She is not on any muscle relaxer type medication long-term, has taken this for neck pain issues as well as prednisone and  was not sure if this had a large impact in her back pain.  She takes mainly Tylenol arthritis as needed for pain which is mildly helpful. She also sees dermatology for problems including rosacea and psoriasis previously prescribed just topical medication.  She has had psoriasis for many years mostly rashes on the scalp and arms this has never been a severe problem is currently pretty well controlled on the topical medication.   Labs reivewed 12/2018 HBV neg  03/2018  ANA neg dsDNA  neg CCP neg ESR 19 CRP 16.8   07/2016 HLA-B27 neg HCV neg   Imaging reviewed 12/31/17 MRI PELVIS W WO CONTRAST MSK IMPRESSION:  Findings most consistent with chronic nonactive sacroiliitis, similar to the prior exam. No evidence of active sacroiliitis. Low-lying intrauterine device which appears to be at least partially within the cervical canal. This is suboptimally evaluated and pelvic ultrasound may be performed for further characterization.    DMARD Hx Humira - lack of response   No Rheumatology ROS completed.   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   Hypokalemia 01/01/2019   Elevated hemoglobin A1c 02/12/2018   Dyslipidemia 02/05/2018   Abnormal skin growth 11/15/2016   PTSD (post-traumatic stress disorder) 10/24/2016   Severe bipolar I disorder, most recent episode depressed without psychotic features (HCC) 10/23/2016   Ankylosing spondylitis (HCC) 10/23/2016   Sacroiliac inflammation (HCC) 08/02/2016   Hypertension 05/28/2016   Vitamin D deficiency 05/02/2016   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   Hypothyroidism 02/13/2016   Anxiety and depression 01/23/2016   Status post tubal ligation 09/22/2015   Morbid obesity with BMI of 50.0-59.9, adult (HCC) 04/25/2015   Anemia, iron deficiency 01/04/2015   Headache, migraine 01/04/2015   Abnormal cytological findings in female genital organs 07/04/2011    Past Medical History:  Diagnosis Date   Abnormal Pap smear of cervix    Arthritis    Arthritis, rheumatoid (HCC)    Atypical chest pain     Bipolar 1 disorder (HCC)    Headache    MIGRAINES   Hyperlipidemia    Hypertension    Intractable nausea and vomiting 01/10/2019   Morbid obesity (HCC)    a. 11/2018 s/p Robotic Sleeve Gastrectomy @ [facility].   Osteoarthritis    Palpitations    a. 09/2017 Zio Monitor: predominantly RSR, avg HR 87, 11 A tach/SVT episodes, fastest 176 x 7 beats, longest 9 beats. Rare PAC's, occas PVC's, rare couplets/triplets.   Persistent Nausea and vomiting    a. Following sleeve gastrectomy 11/2018   PTSD (post-traumatic stress disorder)    PVC (premature ventricular contraction)    Sacroiliitis (HCC)    Social phobia    Stroke [facility])     Family History  Problem Relation Age of Onset   Rheum arthritis Mother    Healthy Father    Healthy Sister    Healthy Brother    Diabetes Maternal Grandmother    Heart disease Maternal Grandmother    Heart failure Maternal Grandmother    Heart attack Maternal Grandmother    Heart Problems Maternal Grandfather    Stroke Maternal Grandfather  Atrial fibrillation Paternal Grandmother    Stroke Other        mat great gf had stroke at 61   Atrial fibrillation Daughter    Atrial fibrillation Son    Cancer Neg Hx    Past Surgical History:  Procedure Laterality Date   CHOLECYSTECTOMY     LAPAROSCOPIC GASTRIC SLEEVE RESECTION  11/19/2018   LAPAROSCOPIC TUBAL LIGATION Bilateral 09/19/2015   Procedure: LAPAROSCOPIC BILATERAL TUBAL BANDING;  Surgeon: Herold Harms, MD;  Location: [facility] ORS;  Service: Gynecology;  Laterality: Bilateral;   ROBOTIC ASSISTED TOTAL HYSTERECTOMY Bilateral 08/28/2021   Procedure: XI ROBOTIC ASSISTED HYSTERECTOMY, BILATERAL SALPINGECTOMY;  Surgeon: Hildred Laser, MD;  Location: [facility] ORS;  Service: Gynecology;  Laterality: Bilateral;   Social History   Social History Narrative   Lives in [place] with husband   Right handed   Immunization History  Administered Date(s) Administered   Influenza, Seasonal, Injecte, Preservative Fre  05/30/2011   Influenza,inj,Quad PF,6+ Mos 03/04/2014, 02/22/2015, 04/17/2018, 03/12/2022   Influenza-Unspecified 03/18/2016   MMR 06/15/2015   Pneumococcal Conjugate-13 09/07/2016   Pneumococcal Polysaccharide-23 12/11/2016   Tdap 10/19/2011, 03/21/2015, 11/15/2016     Objective: Vital Signs: LMP 06/27/2021 (Approximate)    Physical Exam   Musculoskeletal Exam: ***  CDAI Exam: CDAI Score: -- Patient Global: --; Provider Global: -- Swollen: --; Tender: -- Joint Exam 03/04/2023   No joint exam has been documented for this visit   There is currently no information documented on the homunculus. Go to the Rheumatology activity and complete the homunculus joint exam.  Investigation: No additional findings.  Imaging: No results found.  Recent Labs: Lab Results  Component Value Date   WBC 8.6 11/28/2022   HGB 12.6 11/28/2022   PLT 367 11/28/2022   NA 143 01/10/2023   K 4.1 01/10/2023   CL 106 01/10/2023   CO2 24 01/10/2023   GLUCOSE 82 01/10/2023   BUN 9 01/10/2023   CREATININE 0.77 01/10/2023   BILITOT 0.4 11/28/2022   ALKPHOS 108 11/01/2022   AST 14 11/28/2022   ALT 18 11/28/2022   PROT 6.3 11/28/2022   ALBUMIN 4.3 11/01/2022   CALCIUM 9.5 01/10/2023   GFRAA 117 02/19/2019   QFTBGOLDPLUS Negative 04/25/2022    Speciality Comments: No specialty comments available.  Procedures:  No procedures performed Allergies: Patient has no known allergies.   Assessment / Plan:     Visit Diagnoses: No diagnosis found.  ***  Orders: No orders of the defined types were placed in this encounter.  No orders of the defined types were placed in this encounter.    Follow-Up Instructions: No follow-ups on file.   Metta Clines, RT  Note - This record has been created using [employer].  Chart creation errors have been sought, but may not always  have been located. Such creation errors do not reflect on  the standard of medical care.

## 2023-03-04 ENCOUNTER — Ambulatory Visit: Payer: Managed Care, Other (non HMO) | Admitting: Internal Medicine

## 2023-03-04 DIAGNOSIS — M461 Sacroiliitis, not elsewhere classified: Secondary | ICD-10-CM

## 2023-03-04 DIAGNOSIS — Z79899 Other long term (current) drug therapy: Secondary | ICD-10-CM

## 2023-03-04 DIAGNOSIS — M450 Ankylosing spondylitis of multiple sites in spine: Secondary | ICD-10-CM

## 2023-03-04 DIAGNOSIS — L400 Psoriasis vulgaris: Secondary | ICD-10-CM

## 2023-03-20 ENCOUNTER — Telehealth: Payer: Self-pay

## 2023-03-20 NOTE — Telephone Encounter (Signed)
Received a fax from [facility] stating they have been trying to contact the patient regarding her Taltz. Attempted to contact the patient and left a message to advise her to contact [facility] at [phone].

## 2023-03-27 ENCOUNTER — Other Ambulatory Visit: Payer: Self-pay | Admitting: Family Medicine

## 2023-03-27 DIAGNOSIS — I6930 Unspecified sequelae of cerebral infarction: Secondary | ICD-10-CM

## 2023-03-27 NOTE — Telephone Encounter (Signed)
Requested Prescriptions  Pending Prescriptions Disp Refills   atorvastatin (LIPITOR) 40 MG tablet [Pharmacy Med Name: Atorvastatin Calcium 40 MG Oral Tablet] 90 tablet 3    Sig: TAKE 1 TABLET BY MOUTH DAILY     Cardiovascular:  Antilipid - Statins Failed - 03/27/2023  5:23 AM      Failed - Lipid Panel in normal range within the last 12 months    Cholesterol, Total  Date Value Ref Range Status  02/07/2023 83 (L) 100 - 199 mg/dL Final   LDL Cholesterol (Calc)  Date Value Ref Range Status  10/14/2018 93 mg/dL (calc) Final    Comment:    Reference range: <100 . Desirable range <100 mg/dL for primary prevention;   <70 mg/dL for patients with CHD or diabetic patients  with > or = 2 CHD risk factors. Marland Kitchen LDL-C is now calculated using the Martin-Hopkins  calculation, which is a validated novel method providing  better accuracy than the Friedewald equation in the  estimation of LDL-C.  Horald Pollen et al. Lenox Ahr. 8295;621(30): 2061-2068  (http://education.[facility].com/faq/FAQ164)    LDL Chol Calc (NIH)  Date Value Ref Range Status  02/07/2023 34 0 - 99 mg/dL Final   HDL  Date Value Ref Range Status  02/07/2023 32 (L) >39 mg/dL Final   Triglycerides  Date Value Ref Range Status  02/07/2023 82 0 - 149 mg/dL Final         Passed - Patient is not pregnant      Passed - Valid encounter within last 12 months    Recent Outpatient Visits           1 month ago Annual physical exam   [place] [facility] [place], Netta Neat, DO   4 months ago Acute non-recurrent frontal sinusitis   [place] [facility] Smitty Cords, DO   4 months ago RUQ abdominal pain   [place] [facility] [place], Netta Neat, DO   5 months ago Ankylosing spondylitis of multiple sites in spine [facility])   [place] [facility] [place], Netta Neat, DO   7 months ago Influenza B   [place] [facility] [place], Netta Neat, [place]

## 2023-03-30 ENCOUNTER — Other Ambulatory Visit: Payer: Self-pay | Admitting: Family Medicine

## 2023-03-30 DIAGNOSIS — M545 Low back pain, unspecified: Secondary | ICD-10-CM

## 2023-03-30 DIAGNOSIS — M450 Ankylosing spondylitis of multiple sites in spine: Secondary | ICD-10-CM

## 2023-04-01 MED ORDER — TRAMADOL HCL 50 MG PO TABS: 50.0000 mg | ORAL_TABLET | Freq: Three times a day (TID) | ORAL | 0 refills | Status: DC | PRN

## 2023-04-15 ENCOUNTER — Encounter: Payer: Self-pay | Admitting: Family Medicine

## 2023-04-15 DIAGNOSIS — E039 Hypothyroidism, unspecified: Secondary | ICD-10-CM

## 2023-04-15 MED ORDER — LEVOTHYROXINE SODIUM 25 MCG PO TABS: 25.0000 ug | ORAL_TABLET | Freq: Every day | ORAL | 3 refills | Status: DC

## 2023-05-09 ENCOUNTER — Encounter: Payer: Self-pay | Admitting: Family Medicine

## 2023-05-09 ENCOUNTER — Other Ambulatory Visit: Payer: Self-pay | Admitting: Family Medicine

## 2023-05-09 DIAGNOSIS — G8929 Other chronic pain: Secondary | ICD-10-CM

## 2023-05-09 DIAGNOSIS — F314 Bipolar disorder, current episode depressed, severe, without psychotic features: Secondary | ICD-10-CM

## 2023-05-09 DIAGNOSIS — M450 Ankylosing spondylitis of multiple sites in spine: Secondary | ICD-10-CM

## 2023-05-09 DIAGNOSIS — Z79899 Other long term (current) drug therapy: Secondary | ICD-10-CM

## 2023-05-09 MED ORDER — WEGOVY 1.7 MG/0.75ML ~~LOC~~ SOAJ: 1.7000 mg | SUBCUTANEOUS | 1 refills | Status: AC

## 2023-05-10 ENCOUNTER — Telehealth: Payer: Self-pay

## 2023-05-10 NOTE — Telephone Encounter (Signed)
Requested medication (s) are due for refill today: Yes  Requested medication (s) are on the active medication list: Yes  Last refill:  04/01/23 #30, 0RF  Future visit scheduled: No  Notes to clinic:  Unable to refill per protocol, cannot delegate.      Requested Prescriptions  Pending Prescriptions Disp Refills   traMADol (ULTRAM) 50 MG tablet [Pharmacy Med Name: TRAMADOL 50MG  TABLETS] 30 tablet     Sig: TAKE 1 TABLET(50 MG) BY MOUTH EVERY 8 HOURS AS NEEDED     Not Delegated - Analgesics:  Opioid Agonists Failed - 05/09/2023  1:47 PM      Failed - This refill cannot be delegated      Failed - Urine Drug Screen completed in last 360 days      Failed - Valid encounter within last 3 months    Recent Outpatient Visits           3 months ago Annual physical exam   [place] [facility] [place], Netta Neat, DO   5 months ago Acute non-recurrent frontal sinusitis   [place] [facility] Smitty Cords, DO   6 months ago RUQ abdominal pain   [place] [facility] [place], Netta Neat, DO   7 months ago Ankylosing spondylitis of multiple sites in spine [facility])   [place] [facility] Smitty Cords, DO   9 months ago Influenza B   [place] [facility] [place], Netta Neat, [place]

## 2023-05-10 NOTE — Telephone Encounter (Signed)
Key: [id number]

## 2023-05-11 ENCOUNTER — Encounter: Payer: Self-pay | Admitting: Family Medicine

## 2023-05-13 ENCOUNTER — Other Ambulatory Visit: Payer: Self-pay

## 2023-05-13 DIAGNOSIS — I6930 Unspecified sequelae of cerebral infarction: Secondary | ICD-10-CM

## 2023-05-13 MED ORDER — ATORVASTATIN CALCIUM 40 MG PO TABS: 40.0000 mg | ORAL_TABLET | Freq: Every day | ORAL | 3 refills | Status: DC

## 2023-05-13 NOTE — Telephone Encounter (Signed)
Your prior authorization for Reginal Lutes has been approved! More Info Personalized support and financial assistance may be available through the [employer] program. For more information, and to see program requirements, click on the More Info button to the right.  Message from plan: Request Reference Number: [id number]. WEGOVY INJ 1.7MG  is approved through 11/10/2023. Your patient may now fill this prescription and it will be covered.. Authorization Expiration Date: Nov 10, 2023.

## 2023-05-13 NOTE — Telephone Encounter (Signed)
Adi Fredman (KeyIan Malkin)  RUEAVW   Your information has been sent to [facility].

## 2023-05-14 ENCOUNTER — Other Ambulatory Visit: Payer: Self-pay | Admitting: Internal Medicine

## 2023-05-14 DIAGNOSIS — Z111 Encounter for screening for respiratory tuberculosis: Secondary | ICD-10-CM

## 2023-05-14 DIAGNOSIS — Z79899 Other long term (current) drug therapy: Secondary | ICD-10-CM

## 2023-05-14 NOTE — Telephone Encounter (Signed)
Last Fill: 02/05/2023  Labs: 03/12/2023  Potassium 5.2 WBC 14.20  TB Gold: 04/25/2022 Negative   Next Visit:  No follow-ups on file.   Last Visit: 11/28/2022  DX:Ankylosing spondylitis of multiple sites in spine   Current Dose per office note 11/28/2022: Taltz 80 mg subcu monthly   Attempted to contact the patient and left a message that her TB Gold test is due.   Okay to refill Taltz?

## 2023-05-22 ENCOUNTER — Encounter: Payer: Self-pay | Admitting: Family Medicine

## 2023-05-22 DIAGNOSIS — E039 Hypothyroidism, unspecified: Secondary | ICD-10-CM

## 2023-05-22 LAB — BASIC METABOLIC PANEL
BUN/Creatinine Ratio: 10 (ref 9–23)
BUN: 9 mg/dL (ref 6–20)
CO2: 23 mmol/L (ref 20–29)
Calcium: 9.6 mg/dL (ref 8.7–10.2)
Chloride: 102 mmol/L (ref 96–106)
Creatinine, Ser: 0.89 mg/dL (ref 0.57–1.00)
Glucose: 90 mg/dL (ref 70–99)
Potassium: 4.6 mmol/L (ref 3.5–5.2)
Sodium: 139 mmol/L (ref 134–144)
eGFR: 87 mL/min/{1.73_m2} (ref >59)

## 2023-05-22 LAB — TSH: TSH: 4.56 u[IU]/mL — ABNORMAL HIGH (ref 0.450–4.500)

## 2023-05-22 LAB — LITHIUM LEVEL: Lithium Lvl: 0.7 mmol/L (ref 0.5–1.2)

## 2023-06-04 ENCOUNTER — Other Ambulatory Visit: Payer: Self-pay | Admitting: Internal Medicine

## 2023-06-04 NOTE — Telephone Encounter (Signed)
Last Fill: 05/14/2023  Labs: 03/12/2023 CBC WBC 14.20  05/21/2023 BMP normal  TB Gold: 04/25/2022 negative  Next Visit: No follow-ups on file.   Last Visit: 11/28/2022  DX:Ankylosing spondylitis of multiple sites in spine   Current Dose per office note 11/28/2022: not mentioned  Attempted to contact the patient and left a message advising TB Gold test is due and advised our office lab hours.   Okay to refill Taltz?

## 2023-06-05 NOTE — Telephone Encounter (Addendum)
Attempted to contact the patient and left a message to call the office back. 

## 2023-06-05 NOTE — Telephone Encounter (Signed)
I think she was trying stopping the medicine due to lack of improvement from it.

## 2023-06-18 ENCOUNTER — Other Ambulatory Visit: Payer: Self-pay | Admitting: Family Medicine

## 2023-06-18 ENCOUNTER — Other Ambulatory Visit: Payer: Self-pay | Admitting: Internal Medicine

## 2023-06-18 DIAGNOSIS — G8929 Other chronic pain: Secondary | ICD-10-CM

## 2023-06-18 DIAGNOSIS — M450 Ankylosing spondylitis of multiple sites in spine: Secondary | ICD-10-CM

## 2023-06-18 MED ORDER — TRAMADOL HCL 50 MG PO TABS: 50.0000 mg | ORAL_TABLET | Freq: Three times a day (TID) | ORAL | 2 refills | Status: DC | PRN

## 2023-06-22 NOTE — Telephone Encounter (Signed)
 Requested medication (s) are due for refill today: Yes  Requested medication (s) are on the active medication list: Yes  Last refill:  06/18/23  Future visit scheduled: No  Notes to clinic:  Unable to refill per protocol, cannot delegate. Unable to refuse this duplicate request.     Requested Prescriptions  Pending Prescriptions Disp Refills   traMADol  (ULTRAM ) 50 MG tablet [Pharmacy Med Name: TRAMADOL  50MG  TABLETS] 30 tablet     Sig: TAKE 1 TABLET(50 MG) BY MOUTH EVERY 8 HOURS AS NEEDED     Not Delegated - Analgesics:  Opioid Agonists Failed - 06/22/2023 10:32 AM      Failed - This refill cannot be delegated      Failed - Urine Drug Screen completed in last 360 days      Failed - Valid encounter within last 3 months    Recent Outpatient Visits           4 months ago Annual physical exam   [place] [facility] [place], Marsa PARAS, DO   6 months ago Acute non-recurrent frontal sinusitis   [facility] [place] Marsa PARAS, DO   7 months ago RUQ abdominal pain   [place] [facility] [place], Marsa PARAS, DO   8 months ago Ankylosing spondylitis of 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

## 2023-08-29 ENCOUNTER — Encounter: Payer: Self-pay | Admitting: Family Medicine

## 2023-08-30 ENCOUNTER — Other Ambulatory Visit: Payer: Self-pay | Admitting: Family Medicine

## 2023-08-30 ENCOUNTER — Ambulatory Visit: Admitting: Family Medicine

## 2023-08-30 VITALS — BP 130/78 | HR 81 | Ht 61.0 in | Wt 239.0 lb

## 2023-08-30 DIAGNOSIS — L089 Local infection of the skin and subcutaneous tissue, unspecified: Secondary | ICD-10-CM

## 2023-08-30 DIAGNOSIS — E039 Hypothyroidism, unspecified: Secondary | ICD-10-CM

## 2023-08-30 DIAGNOSIS — F314 Bipolar disorder, current episode depressed, severe, without psychotic features: Secondary | ICD-10-CM

## 2023-08-30 DIAGNOSIS — Z79899 Other long term (current) drug therapy: Secondary | ICD-10-CM

## 2023-08-30 DIAGNOSIS — W57XXXA Bitten or stung by nonvenomous insect and other nonvenomous arthropods, initial encounter: Secondary | ICD-10-CM

## 2023-08-30 MED ORDER — MUPIROCIN 2 % EX OINT: 1.0000 | TOPICAL_OINTMENT | Freq: Two times a day (BID) | CUTANEOUS | 1 refills | Status: AC

## 2023-08-30 NOTE — Patient Instructions (Addendum)
 Thank you for coming to the office today.  Use topical Mupirocin twice a day for 2 weeks  Lab orders for [facility]  Please schedule a Follow-up Appointment to: Return if symptoms worsen or fail to improve.  If you have any other questions or concerns, please feel free to call the office or send a message through MyChart. You may also schedule an earlier appointment if necessary.  Additionally, you may be receiving a survey about your experience at our office within a few days to 1 week by e-mail or mail. We value your feedback.  Saralyn Pilar, DO [facility], [place]

## 2023-08-30 NOTE — Progress Notes (Signed)
 Subjective:    Patient ID: Leah Osborne, female    DOB: [birth year], 38 y.o.   MRN: [id number]  Leah Osborne is a 38 y.o. female presenting on 08/30/2023 for Insect Bite   HPI  Discussed the use of AI scribe software for clinical note transcription with the patient, who gave verbal consent to proceed.  History of Present Illness   Leah Osborne is a 38 year old female who presents with a possible cellulitis skin infection from a bug bite.  She has a bug bite on her leg, which she suspects might have led to a possible cellulitis skin infection. She recalls a similar incident occurring two to three years ago on the same leg, although she is uncertain of the exact location. She has not applied any specific treatments except for Aquaphor to address dryness and scabbing.  She mentions having spots on her lower back, which she is unsure if they are due to a spider bite or from her own picking and itching. These spots have been present for about two and a half weeks. She describes them as superficial scabs and scratches, and notes that they do not appear to be infected. She experiences fluid retention and swelling, which she sometimes attempts to relieve by opening the scabs.  She has a history of picking at her skin, which she attributes to nervousness and has been informed by a mental health provider that it may be a form of OCD. She mentions a previous incident involving a spot on her arm, which was treated with Bactroban (mupirocin) prescribed by dermatology. She also notes a spot on her face that becomes rough and scabby, prompting her to pick at it.  She recently moved into a new house, which she suspects might have exposed her to potential triggers for her skin issues, such as unpacking and opening up previously unused spaces.            08/30/2023    3:37 PM 02/07/2023    8:47 AM 11/26/2022    3:38 PM  Depression screen PHQ 2/9  Decreased Interest 1 0 0  Down,  Depressed, Hopeless 0 0 0  PHQ - 2 Score 1 0 0  Altered sleeping 0 0 0  Tired, decreased energy 2 0 0  Change in appetite 0 0 0  Feeling bad or failure about yourself  0 0 0  Trouble concentrating 1 0 0  Moving slowly or fidgety/restless 0 0 0  Suicidal thoughts 0 0 0  PHQ-9 Score 4 0 0  Difficult doing work/chores Somewhat difficult Not difficult at all Not difficult at all       08/30/2023    3:37 PM 02/07/2023    8:47 AM 11/26/2022    3:38 PM 10/05/2022    3:25 PM  GAD 7 : Generalized Anxiety Score  Nervous, Anxious, on Edge 3 0 0 1  Control/stop worrying 2 0 0 1  Worry too much - different things 2 0 0 1  Trouble relaxing 2 0 0 1  Restless 2 1 0 0  Easily annoyed or irritable 1 0 0 1  Afraid - awful might happen 0 0 0 0  Total GAD 7 Score 12 1 0 5  Anxiety Difficulty Somewhat difficult Not difficult at all Not difficult at all Not difficult at all    Social History   Tobacco Use   Smoking status: Never    Passive exposure: Never   Smokeless tobacco: Never  Vaping  Use   Vaping status: Never Used  Substance Use Topics   Alcohol use: Not Currently    Alcohol/week: 0.0 standard drinks of alcohol   Drug use: No    Review of Systems Per HPI unless specifically indicated above     Objective:    BP 130/78   Pulse 81   Ht 5\' 1"  (1.549 m)   Wt 239 lb (108.4 kg)   LMP 06/27/2021 (Approximate)   SpO2 97%   BMI 45.16 kg/m   Wt Readings from Last 3 Encounters:  08/30/23 239 lb (108.4 kg)  02/07/23 270 lb (122.5 kg)  11/28/22 277 lb (125.6 kg)    Physical Exam Vitals and nursing note reviewed.  Constitutional:      General: She is not in acute distress.    Appearance: Normal appearance. She is well-developed. She is not diaphoretic.     Comments: Well-appearing, comfortable, cooperative  HENT:     Head: Normocephalic and atraumatic.  Eyes:     General:        Right eye: No discharge.        Left eye: No discharge.     Conjunctiva/sclera: Conjunctivae  normal.  Cardiovascular:     Rate and Rhythm: Normal rate.  Pulmonary:     Effort: Pulmonary effort is normal.  Skin:    General: Skin is warm and dry.     Findings: Lesion (R lower ext anterior shin with one localized lesion superficial abrasion 1 cm with scab and surrounding localized erythema but no spreading. other spots similar on back without erythema.. Evidence of scabs on fingers) present. No erythema or rash.  Neurological:     Mental Status: She is alert and oriented to person, place, and time.  Psychiatric:        Mood and Affect: Mood normal.        Behavior: Behavior normal.        Thought Content: Thought content normal.     Comments: Well groomed, good eye contact, normal speech and thoughts     Results for orders placed or performed in visit on 05/09/23  TSH   Collection Time: 05/21/23 11:11 AM  Result Value Ref Range   TSH 4.560 (H) 0.450 - 4.500 uIU/mL  Lithium level   Collection Time: 05/21/23 11:11 AM  Result Value Ref Range   Lithium Lvl 0.7 0.5 - 1.2 mmol/L  Basic Metabolic Panel (BMET)   Collection Time: 05/21/23 11:11 AM  Result Value Ref Range   Glucose 90 70 - 99 mg/dL   BUN 9 6 - 20 mg/dL   Creatinine, Ser 8.46 0.57 - 1.00 mg/dL   eGFR 87 >96 EX/BMW/4.13   BUN/Creatinine Ratio 10 9 - 23   Sodium 139 134 - 144 mmol/L   Potassium 4.6 3.5 - 5.2 mmol/L   Chloride 102 96 - 106 mmol/L   CO2 23 20 - 29 mmol/L   Calcium 9.6 8.7 - 10.2 mg/dL      Assessment & Plan:   Problem List Items Addressed This Visit     Severe bipolar I disorder, most recent episode depressed without psychotic features (HCC)   Other Visit Diagnoses       Skin infection    -  Primary   Relevant Medications   mupirocin ointment (BACTROBAN) 2 %     Bug bite, initial encounter       Relevant Medications   mupirocin ointment (BACTROBAN) 2 %        Persistent skin  lesions Lesions likely due to bug bite or irritation, exacerbated by picking. No cellulitis. Fluid retention  may slow healing. - Prescribe topical mupirocin (Bactroban) twice daily for two weeks. - Advise covering lesions to prevent irritation. - Instruct to apply ointment to back lesions to aid healing and prevent scratching. - Discuss importance of avoiding scratching to reduce infection risk.  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 Behavioral intervention discussed with mental health provider. - Encourage ointment application as a behavioral intervention. - Reinforce channeling behavior into different activities.      Note future labs placed and printed [facility] given to patient as requested by her Psychiatry on her medication for Bipolar. - Order TSH, BMP, and lithium level tests at [facility] before April 2nd follow-up.   No orders of the defined types were placed in this encounter.   Meds ordered this encounter  Medications   mupirocin ointment (BACTROBAN) 2 %    Sig: Apply 1 Application topically 2 (two) times daily. For up to 2 weeks for skin infection. May repeat if need    Dispense:  22 g    Refill:  1    Follow up plan: Return if symptoms worsen or fail to improve.   Saralyn Pilar, DO [facility] 08/30/2023, 3:55 PM

## 2023-09-04 ENCOUNTER — Encounter: Payer: Self-pay | Admitting: Internal Medicine

## 2023-09-04 ENCOUNTER — Other Ambulatory Visit: Payer: Self-pay

## 2023-09-04 LAB — BASIC METABOLIC PANEL
BUN/Creatinine Ratio: 10 (ref 9–23)
BUN: 9 mg/dL (ref 6–20)
CO2: 21 mmol/L (ref 20–29)
Calcium: 9.5 mg/dL (ref 8.7–10.2)
Chloride: 103 mmol/L (ref 96–106)
Creatinine, Ser: 0.86 mg/dL (ref 0.57–1.00)
Glucose: 69 mg/dL — ABNORMAL LOW (ref 70–99)
Potassium: 4.1 mmol/L (ref 3.5–5.2)
Sodium: 141 mmol/L (ref 134–144)
eGFR: 90 mL/min/{1.73_m2} (ref >59)

## 2023-09-04 LAB — TSH: TSH: 5.46 u[IU]/mL — ABNORMAL HIGH (ref 0.450–4.500)

## 2023-09-04 LAB — LITHIUM LEVEL: Lithium Lvl: 0.7 mmol/L (ref 0.5–1.2)

## 2023-09-04 MED ORDER — LEVOTHYROXINE SODIUM 50 MCG PO TABS: 50.0000 ug | ORAL_TABLET | Freq: Every day | ORAL | 0 refills | Status: DC

## 2023-09-11 ENCOUNTER — Other Ambulatory Visit: Payer: Self-pay

## 2023-09-11 MED ORDER — LEVOTHYROXINE SODIUM 50 MCG PO TABS: 50.0000 ug | ORAL_TABLET | Freq: Every day | ORAL | 0 refills | Status: DC

## 2023-09-30 ENCOUNTER — Encounter: Payer: Self-pay | Admitting: Family Medicine

## 2023-09-30 DIAGNOSIS — I6930 Unspecified sequelae of cerebral infarction: Secondary | ICD-10-CM

## 2023-10-01 MED ORDER — ASPIRIN 81 MG PO TBEC: 81.0000 mg | DELAYED_RELEASE_TABLET | Freq: Every day | ORAL | 3 refills | Status: AC

## 2023-10-16 ENCOUNTER — Encounter: Payer: Self-pay | Admitting: Family Medicine

## 2023-10-22 ENCOUNTER — Encounter: Payer: Self-pay | Admitting: Family Medicine

## 2023-11-17 ENCOUNTER — Encounter: Payer: Self-pay | Admitting: Family Medicine

## 2023-11-17 DIAGNOSIS — E039 Hypothyroidism, unspecified: Secondary | ICD-10-CM

## 2023-11-17 DIAGNOSIS — Z79899 Other long term (current) drug therapy: Secondary | ICD-10-CM

## 2023-11-17 DIAGNOSIS — F314 Bipolar disorder, current episode depressed, severe, without psychotic features: Secondary | ICD-10-CM

## 2023-11-30 LAB — T4, FREE: Free T4: 1.06 ng/dL (ref 0.82–1.77)

## 2023-11-30 LAB — BASIC METABOLIC PANEL WITH GFR
BUN/Creatinine Ratio: 7 — ABNORMAL LOW (ref 9–23)
BUN: 6 mg/dL (ref 6–20)
CO2: 22 mmol/L (ref 20–29)
Calcium: 9.1 mg/dL (ref 8.7–10.2)
Chloride: 105 mmol/L (ref 96–106)
Creatinine, Ser: 0.81 mg/dL (ref 0.57–1.00)
Glucose: 85 mg/dL (ref 70–99)
Potassium: 3.9 mmol/L (ref 3.5–5.2)
Sodium: 140 mmol/L (ref 134–144)
eGFR: 97 mL/min/{1.73_m2} (ref >59)

## 2023-11-30 LAB — LITHIUM LEVEL: Lithium Lvl: 0.7 mmol/L (ref 0.5–1.2)

## 2023-11-30 LAB — TSH: TSH: 4.12 u[IU]/mL (ref 0.450–4.500)

## 2023-12-04 ENCOUNTER — Ambulatory Visit: Payer: Self-pay | Admitting: Family Medicine

## 2023-12-11 ENCOUNTER — Other Ambulatory Visit: Payer: Self-pay | Admitting: Family Medicine

## 2023-12-12 NOTE — Telephone Encounter (Signed)
 Requested Prescriptions  Pending Prescriptions Disp Refills   levothyroxine  (SYNTHROID ) 50 MCG tablet [Pharmacy Med Name: LEVOTHYROXINE  0.05MG  ( ) TAB] 90 tablet 1    Sig: TAKE 1 TABLET(50 MCG) BY MOUTH DAILY BEFORE BREAKFAST     Endocrinology:  Hypothyroid Agents Failed - 12/12/2023  4:48 PM      Failed - Valid encounter within last 12 months    Recent Outpatient Visits           3 months ago Skin infection   [place] [facility] [place], Marsa PARAS, DO              Passed - TSH in normal range and within 360 days    TSH  Date Value Ref Range Status  11/29/2023 4.120 0.450 - 4.500 uIU/mL Final

## 2023-12-31 ENCOUNTER — Other Ambulatory Visit: Payer: Self-pay | Admitting: Family Medicine

## 2023-12-31 DIAGNOSIS — M545 Low back pain, unspecified: Secondary | ICD-10-CM

## 2023-12-31 DIAGNOSIS — G8929 Other chronic pain: Secondary | ICD-10-CM

## 2023-12-31 DIAGNOSIS — M450 Ankylosing spondylitis of multiple sites in spine: Secondary | ICD-10-CM

## 2024-01-02 NOTE — Telephone Encounter (Signed)
 Requested medication (s) are due for refill today:   Provider to review  Requested medication (s) are on the active medication list:   Yes  Future visit scheduled:   No.   LOV 08/30/2023   Last ordered: 06/24/2023 #30, 2 refills  Non delegated refill    Requested Prescriptions  Pending Prescriptions Disp Refills   traMADol  (ULTRAM ) 50 MG tablet [Pharmacy Med Name: TRAMADOL  50MG  TABLETS] 30 tablet     Sig: TAKE 1 TABLET BY MOUTH EVERY 8 HOURS AS NEEDED     Not Delegated - Analgesics:  Opioid Agonists Failed - 01/02/2024  9:42 AM      Failed - This refill cannot be delegated      Failed - Urine Drug Screen completed in last 360 days      Failed - Valid encounter within last 3 months    Recent Outpatient Visits           4 months ago Skin infection   [facility] [place], Marsa PARAS, DO

## 2024-02-03 ENCOUNTER — Other Ambulatory Visit: Payer: Self-pay | Admitting: Internal Medicine

## 2024-02-03 DIAGNOSIS — M450 Ankylosing spondylitis of multiple sites in spine: Secondary | ICD-10-CM

## 2024-02-03 DIAGNOSIS — M545 Low back pain, unspecified: Secondary | ICD-10-CM

## 2024-02-05 NOTE — Telephone Encounter (Signed)
 Requested medication (s) are due for refill today: yes  Requested medication (s) are on the active medication list: yes  Last refill:  01/02/24  Future visit scheduled: yes  Notes to clinic:  Unable to refill per protocol, cannot delegate.      Requested Prescriptions  Pending Prescriptions Disp Refills   traMADol  (ULTRAM ) 50 MG tablet [Pharmacy Med Name: TRAMADOL  50MG  TABLETS] 30 tablet     Sig: TAKE 1 TABLET BY MOUTH EVERY 8 HOURS AS NEEDED     Not Delegated - Analgesics:  Opioid Agonists Failed - 02/05/2024  2:28 PM      Failed - This refill cannot be delegated      Failed - Urine Drug Screen completed in last 360 days      Failed - Valid encounter within last 3 months    Recent Outpatient Visits           5 months ago Skin infection   [place] [facility] [place], Marsa PARAS, [place]

## 2024-02-19 ENCOUNTER — Encounter: Payer: Self-pay | Admitting: Family Medicine

## 2024-02-20 ENCOUNTER — Ambulatory Visit (INDEPENDENT_AMBULATORY_CARE_PROVIDER_SITE_OTHER): Admitting: Family Medicine

## 2024-02-20 VITALS — BP 130/80 | HR 70 | Ht 61.0 in | Wt 229.4 lb

## 2024-02-20 DIAGNOSIS — R300 Dysuria: Secondary | ICD-10-CM

## 2024-02-20 DIAGNOSIS — R310 Gross hematuria: Secondary | ICD-10-CM

## 2024-02-20 DIAGNOSIS — B379 Candidiasis, unspecified: Secondary | ICD-10-CM | POA: Diagnosis not present

## 2024-02-20 DIAGNOSIS — N3001 Acute cystitis with hematuria: Secondary | ICD-10-CM | POA: Diagnosis not present

## 2024-02-20 LAB — POCT URINALYSIS DIPSTICK
Glucose, UA: NEGATIVE
Ketones, UA: NEGATIVE
Nitrite, UA: NEGATIVE
Odor: POSITIVE
Protein, UA: POSITIVE — AB
Spec Grav, UA: 1.01 (ref 1.010–1.025)
Urobilinogen, UA: 0.2 U/dL
pH, UA: 7.5 (ref 5.0–8.0)

## 2024-02-20 MED ORDER — CEPHALEXIN 500 MG PO CAPS: 500.0000 mg | ORAL_CAPSULE | Freq: Three times a day (TID) | ORAL | 0 refills | Status: DC

## 2024-02-20 MED ORDER — FLUCONAZOLE 150 MG PO TABS: ORAL_TABLET | ORAL | 0 refills | Status: DC

## 2024-02-20 NOTE — Patient Instructions (Addendum)
 Thank you for coming to the office today.  1. You have a Urinary Tract Infection - this is very common, your symptoms are reassuring and you should get better within 1 week on the antibiotics - Start Keflex  500mg  3 times daily for next 7 days, complete entire course, even if feeling better - We sent urine for a culture, we will call you within next few days if we need to change antibiotics - Please drink plenty of fluids, improve hydration over next 1 week   Add Diflucan  for yeast  Referral to Urologist   If symptoms worsening, developing nausea / vomiting, worsening back pain, fevers / chills / sweats, then please return for re-evaluation sooner.  If you take AZO OTC - limit this to 2-3 days MAX to avoid affecting kidneys  D-Mannose is a natural supplement that can actually help bind to urinary bacteria and reduce their effectiveness it can help prevent UTI from forming, and may reduce some symptoms. It likely cannot cure an active UTI but it is worth a try and good to prevent them with. Try 500mg  twice a day at a full dose if you want, or check package instructions for more info   Please schedule a Follow-up Appointment to: Return if symptoms worsen or fail to improve.  If you have any other questions or concerns, please feel free to call the office or send a message through MyChart. You may also schedule an earlier appointment if necessary.  Additionally, you may be receiving a survey about your experience at our office within a few days to 1 week by e-mail or mail. We value your feedback.  Marsa Officer, DO [facility], [place]

## 2024-02-20 NOTE — Progress Notes (Signed)
 Subjective:    Patient ID: Leah Osborne, female    DOB: 1986-04-13, 38 y.o.   MRN: [id number]  Leah Osborne is a 38 y.o. female presenting on 02/20/2024 for Urinary Frequency (Started 02/19/24)  Patient presents for a same day appointment.   HPI  Discussed the use of AI scribe software for clinical note transcription with the patient, who gave verbal consent to proceed.  History of Present Illness   Leah Osborne is a 38 year old female who presents with visible blood in the urine and symptoms suggestive of a urinary tract infection.  Gross Hematuria and UTI symptoms - Visible blood in urine noted yesterday, accompanied by passage of a clot - No longer sees visible blood after increasing water intake - Similar episode occurred once previously - Symptoms suggestive of urinary tract infection - History of using antibiotics (Keflex , Bactrim , Cipro ) for urinary issues, with Keflex  as usual treatment  Vaginal symptoms and yeast infections - Suspected yeast infection approximately two weeks ago - Used over-the-counter Azo, which resolved symptoms - Experiences yeast infections when taking antibiotics - Uses Diflucan  for yeast infections  Bladder prolapse - History of prolapsed bladder - Planned repair during hysterectomy was not completed due to unavailability of urologist  Peripheral edema and hydration - Experiences leg swelling - 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 - Reports inadequate water intake - Consumes mostly coffee, energy drinks, and tea - Increased water intake to ten bottles yesterday         02/20/2024   11:42 AM 08/30/2023    3:37 PM 02/07/2023    8:47 AM  Depression screen PHQ 2/9  Decreased Interest 0 1 0  Down, Depressed, Hopeless 0 0 0  PHQ - 2 Score 0 1 0  Altered sleeping 0 0 0  Tired, decreased energy 0 2 0  Change in appetite 0 0 0  Feeling bad or failure about yourself  0 0 0  Trouble concentrating 1 1 0  Moving slowly or  fidgety/restless 0 0 0  Suicidal thoughts 0 0 0  PHQ-9 Score 1 4 0  Difficult doing work/chores Somewhat difficult Somewhat difficult Not difficult at all       02/20/2024   11:43 AM 08/30/2023    3:37 PM 02/07/2023    8:47 AM 11/26/2022    3:38 PM  GAD 7 : Generalized Anxiety Score  Nervous, Anxious, on Edge 3 3 0 0  Control/stop worrying 2 2 0 0  Worry too much - different things 2 2 0 0  Trouble relaxing 2 2 0 0  Restless 1 2 1  0  Easily annoyed or irritable 2 1 0 0  Afraid - awful might happen 1 0 0 0  Total GAD 7 Score 13 12 1  0  Anxiety Difficulty Somewhat difficult Somewhat difficult Not difficult at all Not difficult at all    Social History   Tobacco Use   Smoking status: Never    Passive exposure: Never   Smokeless tobacco: Never  Vaping Use   Vaping status: Never Used  Substance Use Topics   Alcohol use: Not Currently    Alcohol/week: 0.0 standard drinks of alcohol   Drug use: No    Review of Systems Per HPI unless specifically indicated above     Objective:    BP 130/80 (BP Location: Left Arm, Patient Position: Sitting, Cuff Size: Normal)   Pulse 70   Ht 5' 1 (1.549 m)   Wt 229 lb  6 oz (104 kg)   LMP 06/27/2021 (Approximate)   SpO2 99%   BMI 43.34 kg/m   Wt Readings from Last 3 Encounters:  02/20/24 229 lb 6 oz (104 kg)  08/30/23 239 lb (108.4 kg)  02/07/23 270 lb (122.5 kg)    Physical Exam Vitals and nursing note reviewed.  Constitutional:      General: She is not in acute distress.    Appearance: Normal appearance. She is well-developed. She is not diaphoretic.     Comments: Well-appearing, comfortable, cooperative  HENT:     Head: Normocephalic and atraumatic.  Eyes:     General:        Right eye: No discharge.        Left eye: No discharge.     Conjunctiva/sclera: Conjunctivae normal.  Cardiovascular:     Rate and Rhythm: Normal rate.  Pulmonary:     Effort: Pulmonary effort is normal.  Skin:    General: Skin is warm and dry.      Findings: No erythema or rash.  Neurological:     Mental Status: She is alert and oriented to person, place, and time.  Psychiatric:        Mood and Affect: Mood normal.        Behavior: Behavior normal.        Thought Content: Thought content normal.     Comments: Well groomed, good eye contact, normal speech and thoughts     Results for orders placed or performed in visit on 02/20/24  POCT Urinalysis Dipstick   Collection Time: 02/20/24 11:43 AM  Result Value Ref Range   Color, UA yellow    Clarity, UA clear    Glucose, UA Negative Negative   Bilirubin, UA small    Ketones, UA negative    Spec Grav, UA 1.010 1.010 - 1.025   Blood, UA large    pH, UA 7.5 5.0 - 8.0   Protein, UA Positive (A) Negative   Urobilinogen, UA 0.2 0.2 or 1.0 E.U./dL   Nitrite, UA negative    Leukocytes, UA Small (1+) (A) Negative   Appearance     Odor positive       Assessment & Plan:   Problem List Items Addressed This Visit   None Visit Diagnoses       Gross hematuria    -  Primary   Relevant Orders   Ambulatory referral to Urology     Acute cystitis with hematuria       Relevant Medications   cephALEXin  (KEFLEX ) 500 MG capsule     Dysuria       Relevant Orders   Urine Culture   POCT Urinalysis Dipstick (Completed)     Antibiotic-induced yeast infection       Relevant Medications   cephALEXin  (KEFLEX ) 500 MG capsule   fluconazole  (DIFLUCAN ) 150 MG tablet       Urinary tract infection with gross hematuria Leukocytes, protein, and blood in urine indicate UTI. Gross hematuria with clotting suggests significant bleeding, requiring further investigation. Persistent hematuria may need urological evaluation. - Prescribed Keflex  for UTI. - Prescribed Diflucan  to prevent yeast infection. - Sent urine sample for culture. - Refer to urologist for evaluation of gross hematuria and clots passed..  Question - prolapsed bladder (cystocele) Prolapsed bladder not addressed during hysterectomy.   Not main objective for referral today but can be reviewed by Urology Referral to urologist planned for evaluation and management.  Lower extremity edema Leg swelling possibly due to  sedentary lifestyle and inadequate hydration. - Encouraged balanced hydration to manage edema.        Orders Placed This Encounter  Procedures   Urine Culture   Ambulatory referral to Urology    Referral Priority:   Routine    Referral Type:   Consultation    Referral Reason:   Specialty Services Required    Requested Specialty:   Urology    Number of Visits Requested:   1   POCT Urinalysis Dipstick    Meds ordered this encounter  Medications   cephALEXin  (KEFLEX ) 500 MG capsule    Sig: Take 1 capsule (500 mg total) by mouth 3 (three) times daily. For 7 days    Dispense:  21 capsule    Refill:  0   fluconazole  (DIFLUCAN ) 150 MG tablet    Sig: Take one tablet by mouth on Day 1. Repeat dose 2nd tablet on Day 3.    Dispense:  2 tablet    Refill:  0    Follow up plan: Return if symptoms worsen or fail to improve.  Marsa Officer, DO [facility] 02/20/2024, 12:00 PM

## 2024-02-22 LAB — URINE CULTURE
MICRO NUMBER:: 16925071
SPECIMEN QUALITY:: ADEQUATE

## 2024-02-24 ENCOUNTER — Ambulatory Visit: Payer: Self-pay | Admitting: Family Medicine

## 2024-02-24 DIAGNOSIS — N3001 Acute cystitis with hematuria: Secondary | ICD-10-CM

## 2024-03-02 MED ORDER — SULFAMETHOXAZOLE-TRIMETHOPRIM 800-160 MG PO TABS: 1.0000 | ORAL_TABLET | Freq: Two times a day (BID) | ORAL | 0 refills | Status: AC

## 2024-03-05 ENCOUNTER — Other Ambulatory Visit: Payer: Self-pay | Admitting: Family Medicine

## 2024-03-05 DIAGNOSIS — M450 Ankylosing spondylitis of multiple sites in spine: Secondary | ICD-10-CM

## 2024-03-05 DIAGNOSIS — G8929 Other chronic pain: Secondary | ICD-10-CM

## 2024-03-06 NOTE — Telephone Encounter (Signed)
 Requested medications are due for refill today.  yes  Requested medications are on the active medications list.  yes  Last refill. 02/05/2024 #30 0 rf  Future visit scheduled.   no  Notes to clinic.  Refill not delegated.    Requested Prescriptions  Pending Prescriptions Disp Refills   traMADol  (ULTRAM ) 50 MG tablet [Pharmacy Med Name: TRAMADOL  50MG  TABLETS] 30 tablet     Sig: TAKE 1 TABLET BY MOUTH EVERY 8 HOURS AS NEEDED     Not Delegated - Analgesics:  Opioid Agonists Failed - 03/06/2024  1:55 PM      Failed - This refill cannot be delegated      Failed - Urine Drug Screen completed in last 360 days      Passed - Valid encounter within last 3 months    Recent Outpatient Visits           2 weeks ago Gross hematuria   [place] [facility] Edman Marsa PARAS, DO   6 months ago Skin infection   [place] [facility] Edman Marsa PARAS, DO       Future Appointments             In 2 weeks McG[place], Clotilda DELENA RIGGERS [facility]

## 2024-03-20 ENCOUNTER — Ambulatory Visit (INDEPENDENT_AMBULATORY_CARE_PROVIDER_SITE_OTHER): Admitting: Urology

## 2024-03-20 VITALS — BP 144/81 | HR 76 | Ht 61.0 in | Wt 222.0 lb

## 2024-03-20 DIAGNOSIS — Z87440 Personal history of urinary (tract) infections: Secondary | ICD-10-CM

## 2024-03-20 DIAGNOSIS — R300 Dysuria: Secondary | ICD-10-CM | POA: Diagnosis not present

## 2024-03-20 DIAGNOSIS — N3001 Acute cystitis with hematuria: Secondary | ICD-10-CM

## 2024-03-20 DIAGNOSIS — R310 Gross hematuria: Secondary | ICD-10-CM | POA: Diagnosis not present

## 2024-03-20 DIAGNOSIS — Q631 Lobulated, fused and horseshoe kidney: Secondary | ICD-10-CM

## 2024-03-20 DIAGNOSIS — T3695XA Adverse effect of unspecified systemic antibiotic, initial encounter: Secondary | ICD-10-CM

## 2024-03-20 DIAGNOSIS — R1084 Generalized abdominal pain: Secondary | ICD-10-CM

## 2024-03-20 LAB — URINALYSIS, COMPLETE
Bilirubin, UA: NEGATIVE
Glucose, UA: NEGATIVE
Ketones, UA: NEGATIVE
Leukocytes,UA: NEGATIVE
Nitrite, UA: NEGATIVE
Protein,UA: NEGATIVE
RBC, UA: NEGATIVE
Specific Gravity, UA: 1.02 (ref 1.005–1.030)
Urobilinogen, Ur: 0.2 mg/dL (ref 0.2–1.0)
pH, UA: 6.5 (ref 5.0–7.5)

## 2024-03-20 LAB — MICROSCOPIC EXAMINATION: Epithelial Cells (non renal): >10 /HPF — AB (ref 0–10)

## 2024-03-20 MED ORDER — CEPHALEXIN 500 MG PO CAPS: 500.0000 mg | ORAL_CAPSULE | Freq: Three times a day (TID) | ORAL | 0 refills | Status: DC

## 2024-03-20 MED ORDER — DIAZEPAM 5 MG PO TABS: ORAL_TABLET | ORAL | 0 refills | Status: AC

## 2024-03-20 MED ORDER — FLUCONAZOLE 150 MG PO TABS: ORAL_TABLET | ORAL | 0 refills | Status: AC

## 2024-03-20 NOTE — Progress Notes (Signed)
 03/20/2024 7:40 PM   Katoria T Shoults August 11, 1985 [id number]  Referring provider: Edman Marsa PARAS, DO [address] 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 Nephrolithiasis - non contrast CT (2020) Horseshoe kidney. LEFT hydronephrosis and hydroureter secondary to a 4 mm mid to distal LEFT ureteral calculus.  Additional small nonobstructing LEFT renal calculus.  3. Cystocele  Chief Complaint  Patient presents with   Hematuria    HPI: MARGIA WIESEN is a 38 y.o. woman who presents today for gross hematuria.   Previous records reviewed.  She has been having issues with burning when voiding, urgency, feelings of incomplete bladder emptying, lower back pain, lower stomach pain, a low-grade fever and feeling irritable.  She has had 2 urinary tract infections over the last 8 months.  More recently she had an episode of UTI associated with blood and clots.  She had to have 2 rounds of antibiotics to treat the infection.  She continues to have urgency, dysuria and urge incontinence.  UA benign  PMH: Past Medical History:  Diagnosis Date   Abnormal Pap smear of cervix    Arthritis    Arthritis, rheumatoid (HCC)    Atypical chest pain    Bipolar 1 disorder (HCC)    Headache    MIGRAINES   Hyperlipidemia    Hypertension    Intractable nausea and vomiting 01/10/2019   Morbid obesity (HCC)    a. 11/2018 s/p Robotic Sleeve Gastrectomy @ [facility].   Osteoarthritis    Palpitations    a. 09/2017 Zio Monitor: predominantly RSR, avg HR 87, 11 A tach/SVT episodes, fastest 176 x 7 beats, longest 9 beats. Rare PAC's, occas PVC's, rare couplets/triplets.   Persistent Nausea and vomiting    a. Following sleeve gastrectomy 11/2018   PTSD (post-traumatic stress disorder)    PVC (premature ventricular contraction)    Sacroiliitis    Social phobia    Stroke [facility])     Surgical History: Past Surgical  History:  Procedure Laterality Date   CHOLECYSTECTOMY     LAPAROSCOPIC GASTRIC SLEEVE RESECTION  11/19/2018   LAPAROSCOPIC TUBAL LIGATION Bilateral 09/19/2015   Procedure: LAPAROSCOPIC BILATERAL TUBAL BANDING;  Surgeon: Gladis DELENA Dollar, MD;  Location: [facility] ORS;  Service: Gynecology;  Laterality: Bilateral;   ROBOTIC ASSISTED TOTAL HYSTERECTOMY Bilateral 08/28/2021   Procedure: XI ROBOTIC ASSISTED HYSTERECTOMY, BILATERAL SALPINGECTOMY;  Surgeon: Connell Davies, MD;  Location: [facility] ORS;  Service: Gynecology;  Laterality: Bilateral;    Home Medications:  Allergies as of 03/20/2024   No Known Allergies      Medication List        Accurate as of March 20, 2024 11:59 PM. If you have any questions, ask your nurse or doctor.          acetaminophen  500 MG tablet Commonly known as: TYLENOL  Take 1,000 mg by mouth every 6 (six) hours as needed for moderate pain or headache.   albuterol  108 (90 Base) MCG/ACT inhaler Commonly known as: VENTOLIN  HFA Inhale 2 puffs into the lungs every 6 (six) hours as needed.   amphetamine-dextroamphetamine 5 MG tablet Commonly known as: ADDERALL Take 5 mg by mouth 2 (two) times daily with a meal.   aspirin  EC 81 MG tablet Commonly known as: Aspirin  Low Dose Take 1 tablet (81 mg total) by mouth daily. Swallow whole.   atorvastatin  40 MG tablet Commonly known as: LIPITOR Take 1 tablet (40  mg total) by mouth daily.   cephALEXin  500 MG capsule Commonly known as: KEFLEX  Take 1 capsule (500 mg total) by mouth 3 (three) times daily. For 7 days   diazepam  5 MG tablet Commonly known as: Valium  Take one 30 minutes prior to your CT scan   ferrous gluconate 324 MG tablet Commonly known as: FERGON Take by mouth.   fluconazole  150 MG tablet Commonly known as: DIFLUCAN  Take one tablet by mouth on Day 1. Repeat dose 2nd tablet on Day 3.   fluticasone  50 MCG/ACT nasal spray Commonly known as: FLONASE  USE TWO SPRAYS IN BOTH NOSTRILS DAILY. USE OR  FOUR TO SIX WEEKS. THEN STOP AND USE SEASONALLY OR AS NEEDED.   levothyroxine  50 MCG tablet Commonly known as: SYNTHROID  TAKE 1 TABLET(50 MCG) BY MOUTH DAILY BEFORE BREAKFAST   lithium  carbonate 300 MG ER tablet Commonly known as: LITHOBID Take 300 mg by mouth 2 (two) times daily.   lumateperone tosylate 42 MG capsule Commonly known as: CAPLYTA Take 42 mg by mouth daily.   mupirocin  ointment 2 % Commonly known as: BACTROBAN  Apply 1 Application topically 2 (two) times daily. For up to 2 weeks for skin infection. May repeat if need   ondansetron  4 MG disintegrating tablet Commonly known as: ZOFRAN -ODT Take 1 tablet (4 mg total) by mouth every 8 (eight) hours as needed for nausea or vomiting.   spironolactone  25 MG tablet Commonly known as: ALDACTONE  TAKE 1 TABLET BY MOUTH DAILY   traMADol  50 MG tablet Commonly known as: ULTRAM  TAKE 1 TABLET BY MOUTH EVERY 8 HOURS AS NEEDED   Wegovy  1.7 MG/0.75ML Soaj SQ injection Generic drug: semaglutide -weight management Inject 1.7 mg into the skin once a week.        Allergies: No Known Allergies  Family History: Family History  Problem Relation Age of Onset   Rheum arthritis Mother    Healthy Father    Healthy Sister    Healthy Brother    Diabetes Maternal Grandmother    Heart disease Maternal Grandmother    Heart failure Maternal Grandmother    Heart attack Maternal Grandmother    Heart Problems Maternal Grandfather    Stroke Maternal Grandfather    Atrial fibrillation Paternal Grandmother    Stroke Other        mat great gf had stroke at 55   Atrial fibrillation Daughter    Atrial fibrillation Son    Cancer Neg Hx     Social History:  reports that she has never smoked. She has never been exposed to tobacco smoke. She has never used smokeless tobacco. She reports that she does not currently use alcohol. She reports that she does not use drugs.  ROS: Pertinent ROS in HPI  Physical Exam: BP (!) 144/81   Pulse 76    Ht 5' 1 (1.549 m)   Wt 222 lb (100.7 kg)   LMP 06/27/2021 (Approximate)   BMI 41.95 kg/m   Constitutional:  Well nourished. Alert and oriented, No acute distress. HEENT: [place] AT, moist mucus membranes.  Trachea midline Cardiovascular: No clubbing, cyanosis, or edema. Respiratory: Normal respiratory effort, no increased work of breathing. Neurologic: Grossly intact, no focal deficits, moving all 4 extremities. Psychiatric: Normal mood and affect.  Laboratory Data: See Epic and HPI I have reviewed the labs.   Pertinent Imaging: N/A   Assessment & Plan:    1. Gross hematuria - She has a history of a horseshoe kidney and nephrolithiasis and I explained to her that  we need to get a CAT scan to evaluate her for any current stone burden and if she has ureteral stones causing obstruction - She states she gets anxious with CT scans, so I prescribed her some Valium  to take 30 minutes prior to the procedure, advised her to have a driver with her  2. Dysuria (Primary) - Urinalysis, Complete - CULTURE, URINE COMPREHENSIVE - I started her empirically on Ceftin  and gave her prescription for Diflucan  in case she should have a yeast infection while on the antibiotic   Return in about 3 weeks (around 04/10/2024) for CT report .  These notes generated with voice recognition software. I apologize for typographical errors.  CLOTILDA HELON RIGGERS  [facility] [address]  [address] [place], [place] [address] [phone]

## 2024-03-22 ENCOUNTER — Encounter: Payer: Self-pay | Admitting: Urology

## 2024-03-23 ENCOUNTER — Other Ambulatory Visit (HOSPITAL_COMMUNITY): Payer: Self-pay

## 2024-03-24 LAB — CULTURE, URINE COMPREHENSIVE

## 2024-03-25 ENCOUNTER — Ambulatory Visit: Payer: Self-pay | Admitting: Urology

## 2024-04-02 ENCOUNTER — Emergency Department
Admission: EM | Admit: 2024-04-02 | Discharge: 2024-04-02 | Disposition: A | Discharge status: Home / Self Care | Attending: Emergency Medicine | Admitting: Emergency Medicine

## 2024-04-02 ENCOUNTER — Emergency Department

## 2024-04-02 ENCOUNTER — Encounter: Payer: Self-pay | Admitting: Emergency Medicine

## 2024-04-02 ENCOUNTER — Other Ambulatory Visit: Payer: Self-pay

## 2024-04-02 DIAGNOSIS — S5012XA Contusion of left forearm, initial encounter: Secondary | ICD-10-CM | POA: Diagnosis not present

## 2024-04-02 DIAGNOSIS — S59912A Unspecified injury of left forearm, initial encounter: Secondary | ICD-10-CM | POA: Diagnosis present

## 2024-04-02 DIAGNOSIS — T148XXA Other injury of unspecified body region, initial encounter: Secondary | ICD-10-CM
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
## 2024-04-02 NOTE — Discharge Instructions (Signed)
 Workup was reassuring you should return to the ER if you develop fevers, worsening symptoms or any other concern  IMPRESSION: 1. No evidence of fractures.
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
## 2024-04-15 NOTE — Progress Notes (Signed)
 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 Nephrolithiasis - non contrast CT (2020) Horseshoe kidney. LEFT hydronephrosis and hydroureter secondary to a 4 mm mid to distal LEFT ureteral calculus.  Additional small nonobstructing LEFT renal calculus.  3. Cystocele  Chief Complaint  Patient presents with   Follow-up   Dysuria    HPI: Leah Osborne is a 38 y.o. woman who presents today for CT report.   Previous records reviewed.  She was having issues with UTIs associated with gross hematuria.  She also had a history of a horseshoe kidney so CT scan was performed for further evaluation.  Noncontrast CT scan noted the horseshoe kidney, but no calcification or hydronephrosis was noted.  She is still having lower back pain, but she thinks it might be musculoskeletal in nature.  She is also having dysuria which is independent of voiding.  She denies any vaginal discharge.  Patient denies any modifying or aggravating factors.  Patient denies any recent UTI's, gross hematuria, dysuria or suprapubic/flank pain.  Patient denies any fevers, chills, nausea or vomiting.    UA yellow clear, specific gravity 1.010, pH 6.5, 0-5 WBC's, 0-2 RBC's, > 10 epithelial cells, and few bacteria.    PMH: Past Medical History:  Diagnosis Date   Abnormal Pap smear of cervix    Arthritis    Arthritis, rheumatoid (HCC)    Atypical chest pain    Bipolar 1 disorder (HCC)    Headache    MIGRAINES   Hyperlipidemia    Hypertension    Intractable nausea and vomiting 01/10/2019   Morbid obesity (HCC)    a. 11/2018 s/p Robotic Sleeve Gastrectomy @ [facility].   Osteoarthritis    Palpitations    a. 09/2017 Zio Monitor: predominantly RSR, avg HR 87, 11 A tach/SVT episodes, fastest 176 x 7 beats, longest 9 beats.  Rare PAC's, occas PVC's, rare couplets/triplets.   Persistent Nausea and vomiting    a. Following sleeve gastrectomy 11/2018   PTSD (post-traumatic stress disorder)    PVC (premature ventricular contraction)    Sacroiliitis    Social phobia    Stroke [facility])     Surgical History: Past Surgical History:  Procedure Laterality Date   CHOLECYSTECTOMY     LAPAROSCOPIC GASTRIC SLEEVE RESECTION  11/19/2018   LAPAROSCOPIC TUBAL LIGATION Bilateral 09/19/2015   Procedure: LAPAROSCOPIC BILATERAL TUBAL BANDING;  Surgeon: Gladis DELENA Dollar, MD;  Location: [facility] ORS;  Service: Gynecology;  Laterality: Bilateral;   ROBOTIC ASSISTED TOTAL HYSTERECTOMY Bilateral 08/28/2021   Procedure: XI ROBOTIC ASSISTED HYSTERECTOMY, BILATERAL SALPINGECTOMY;  Surgeon: Connell Davies, MD;  Location: [facility] ORS;  Service: Gynecology;  Laterality: Bilateral;    Home Medications:  Allergies as of 04/17/2024   No Known Allergies      Medication List        Accurate as of April 17, 2024 11:59 PM. If you have any questions, ask your nurse or doctor.          acetaminophen  500 MG tablet Commonly known as: TYLENOL  Take 1,000 mg by mouth every 6 (six) hours as needed for moderate pain or headache.   albuterol  108 (90 Base) MCG/ACT inhaler Commonly known as: VENTOLIN  HFA Inhale 2 puffs into the lungs every 6 (six) hours as needed.   amphetamine-dextroamphetamine 5 MG tablet Commonly  known as: ADDERALL Take 5 mg by mouth 2 (two) times daily with a meal.   aspirin  EC 81 MG tablet Commonly known as: Aspirin  Low Dose Take 1 tablet (81 mg total) by mouth daily. Swallow whole.   atorvastatin  40 MG tablet Commonly known as: LIPITOR Take 1 tablet (40 mg total) by mouth daily.   cephALEXin  500 MG capsule Commonly known as: KEFLEX  Take 1 capsule (500 mg total) by mouth 3 (three) times daily. For 7 days   diazepam  5 MG tablet Commonly known as: Valium  Take one 30 minutes prior to your CT scan   ferrous  gluconate 324 MG tablet Commonly known as: FERGON Take by mouth.   fluconazole  150 MG tablet Commonly known as: DIFLUCAN  Take one tablet by mouth on Day 1. Repeat dose 2nd tablet on Day 3.   fluticasone  50 MCG/ACT nasal spray Commonly known as: FLONASE  USE TWO SPRAYS IN BOTH NOSTRILS DAILY. USE OR FOUR TO SIX WEEKS. THEN STOP AND USE SEASONALLY OR AS NEEDED.   levothyroxine  50 MCG tablet Commonly known as: SYNTHROID  TAKE 1 TABLET(50 MCG) BY MOUTH DAILY BEFORE BREAKFAST   lithium  carbonate 300 MG ER tablet Commonly known as: LITHOBID Take 300 mg by mouth 2 (two) times daily.   lumateperone tosylate 42 MG capsule Commonly known as: CAPLYTA Take 42 mg by mouth daily.   mupirocin  ointment 2 % Commonly known as: BACTROBAN  Apply 1 Application topically 2 (two) times daily. For up to 2 weeks for skin infection. May repeat if need   omeprazole 20 MG capsule Commonly known as: PRILOSEC Take 20 mg by mouth.   ondansetron  4 MG disintegrating tablet Commonly known as: ZOFRAN -ODT Take 1 tablet (4 mg total) by mouth every 8 (eight) hours as needed for nausea or vomiting.   spironolactone  25 MG tablet Commonly known as: ALDACTONE  TAKE 1 TABLET BY MOUTH DAILY   Taltz  80 MG/ML pen Generic drug: ixekizumab  Inject 80 mg into the skin.   traMADol  50 MG tablet Commonly known as: ULTRAM  TAKE 1 TABLET BY MOUTH EVERY 8 HOURS AS NEEDED   Wegovy  1.7 MG/0.75ML Soaj SQ injection Generic drug: semaglutide -weight management Inject 1.7 mg into the skin once a week.        Allergies: No Known Allergies  Family History: Family History  Problem Relation Age of Onset   Rheum arthritis Mother    Healthy Father    Healthy Sister    Healthy Brother    Diabetes Maternal Grandmother    Heart disease Maternal Grandmother    Heart failure Maternal Grandmother    Heart attack Maternal Grandmother    Heart Problems Maternal Grandfather    Stroke Maternal Grandfather    Atrial fibrillation  Paternal Grandmother    Stroke Other        mat great gf had stroke at 37   Atrial fibrillation Daughter    Atrial fibrillation Son    Cancer Neg Hx     Social History:  reports that she has never smoked. She has never been exposed to tobacco smoke. She has never used smokeless tobacco. She reports that she does not currently use alcohol. She reports that she does not use drugs.  ROS: Pertinent ROS in HPI  Physical Exam: BP 119/81 (BP Location: Left Arm, Patient Position: Sitting, Cuff Size: Normal)   Pulse 77   Ht 5' 1 (1.549 m)   Wt 230 lb (104.3 kg)   LMP 06/27/2021 (Approximate)   BMI 43.46 kg/m   Constitutional:  Well nourished. Alert and oriented, No acute distress. HEENT: [place] AT, moist mucus membranes.  Trachea midline Cardiovascular: No clubbing, cyanosis, or edema. Respiratory: Normal respiratory effort, no increased work of breathing. GU: No CVA tenderness.  No bladder fullness or masses. Normal external genitalia.  Normal urethral meatus, no lesions, no prolapse, no discharge.   No urethral masses, tenderness and/or tenderness. No bladder fullness, tenderness or masses. Normal vagina mucosa, good estrogen effect, no discharge, no lesions, poor pelvic support, grade II cystocele and no rectocele noted.  Anus and perineum are without rashes or lesions.     Neurologic: Grossly intact, no focal deficits, moving all 4 extremities. Psychiatric: Normal mood and affect.    Laboratory Data: See Epic and HPI I have reviewed the labs.   Pertinent Imaging: Impression  IMPRESSION: 1.  Horseshoe kidney. 2.  No acute abnormality in the abdomen or pelvis.  Electronically Signed by: Debby Chess, MD on 03/31/2024 4:05 PM Narrative  INDICATION: Horseshoe kidney.  Back pain. Kidney infection. COMPARISON:  None.   TECHNIQUE: Routine CT of the abdomen and pelvis was performed without intravenous or oral contrast per renal calculus evaluation protocol. This exam is intended to  evaluate for presence of renal calculi and does not adequately assess for renal or urothelial malignancies. Radiation dose reduction was utilized (automated exposure control, mA or kV adjustment based on patient size, or iterative image reconstruction).  FINDINGS:  VISUALIZED LOWER THORAX: No acute abnormalities.  UROLOGIC: Kidneys/ureters: The patient has a horseshoe kidney. The kidneys are otherwise unremarkable. No calcification or hydronephrosis. No inflammatory changes. Urinary bladder:Intact.  SOLID VISCERA: Liver: Intact. Gallbladder: Gallbladder is surgically absent. Adrenal glands: Intact. Pancreas: Intact. Spleen: Intact.  GI: No bowel obstruction. Normal appendix.  PERITONEAL CAVITY/RETROPERITONEUM: No free fluid. No pneumoperitoneum. No lymphadenopathy. Aorta, IVC, iliac arteries, and major visceral arteries are grossly normal.  PELVIS: No acute abnormalities.  MUSCULOSKELETAL: No acute or destructive osseous processes.  MISC: N/A or as above. Procedure Note  Chess Debby NOVAK, MD - 03/31/2024 Formatting of this note might be different from the original. INDICATION: Horseshoe kidney.  Back pain. Kidney infection. COMPARISON:  None.   TECHNIQUE: Routine CT of the abdomen and pelvis was performed without intravenous or oral contrast per renal calculus evaluation protocol. This exam is intended to evaluate for presence of renal calculi and does not adequately assess for renal or urothelial malignancies. Radiation dose reduction was utilized (automated exposure control, mA or kV adjustment based on patient size, or iterative image reconstruction).  FINDINGS:  VISUALIZED LOWER THORAX: No acute abnormalities.  UROLOGIC: Kidneys/ureters: The patient has a horseshoe kidney. The kidneys are otherwise unremarkable. No calcification or hydronephrosis. No inflammatory changes. Urinary bladder:Intact.  SOLID VISCERA: Liver: Intact. Gallbladder: Gallbladder is  surgically absent. Adrenal glands: Intact. Pancreas: Intact. Spleen: Intact.  GI: No bowel obstruction. Normal appendix.  PERITONEAL CAVITY/RETROPERITONEUM: No free fluid. No pneumoperitoneum. No lymphadenopathy. Aorta, IVC, iliac arteries, and major visceral arteries are grossly normal.  PELVIS: No acute abnormalities.  MUSCULOSKELETAL: No acute or destructive osseous processes.  MISC: N/A or as above.   IMPRESSION: 1.  Horseshoe kidney. 2.  No acute abnormality in the abdomen or pelvis.  Electronically Signed by: Debby Chess, MD on 03/31/2024 4:05 PM Resulting Agency Comment  [id number] Exam End: 03/31/24 12:15   Specimen Collected: 03/31/24 15:56 Last Resulted: 03/31/24 16:05  Received From: [facility]  Result Received: 04/02/24 00:55  I cannot see the images.   Assessment & Plan:    1. Gross hematuria -  non contrast CT did not find any stones or hydronephrosis - hematuria likely secondary to infection   2. Dysuria (Primary) - UA bland - will send urine for culture to rule out UTI  3. Horseshoe kidney - no nephrolithiasis or hydronephrosis seen on CT   Return for Follow up pending labs.  These notes generated with voice recognition software. I apologize for typographical errors.  CLOTILDA HELON RIGGERS  [facility] [address]  [address] [place], [place] [address] [phone]

## 2024-04-17 ENCOUNTER — Ambulatory Visit: Admitting: Urology

## 2024-04-17 VITALS — BP 119/81 | HR 77 | Ht 61.0 in | Wt 230.0 lb

## 2024-04-17 DIAGNOSIS — R310 Gross hematuria: Secondary | ICD-10-CM

## 2024-04-17 DIAGNOSIS — Q631 Lobulated, fused and horseshoe kidney: Secondary | ICD-10-CM

## 2024-04-17 DIAGNOSIS — R300 Dysuria: Secondary | ICD-10-CM | POA: Diagnosis not present

## 2024-04-17 LAB — URINALYSIS, COMPLETE
Bilirubin, UA: NEGATIVE
Glucose, UA: NEGATIVE
Ketones, UA: NEGATIVE
Leukocytes,UA: NEGATIVE
Nitrite, UA: NEGATIVE
Protein,UA: NEGATIVE
RBC, UA: NEGATIVE
Specific Gravity, UA: 1.01 (ref 1.005–1.030)
Urobilinogen, Ur: 0.2 mg/dL (ref 0.2–1.0)
pH, UA: 6.5 (ref 5.0–7.5)

## 2024-04-17 LAB — MICROSCOPIC EXAMINATION: Epithelial Cells (non renal): >10 /HPF — AB (ref 0–10)

## 2024-04-18 ENCOUNTER — Encounter: Payer: Self-pay | Admitting: Urology

## 2024-04-21 ENCOUNTER — Ambulatory Visit: Payer: Self-pay | Admitting: Urology

## 2024-04-21 DIAGNOSIS — N390 Urinary tract infection, site not specified: Secondary | ICD-10-CM

## 2024-04-21 LAB — CULTURE, URINE COMPREHENSIVE

## 2024-04-21 MED ORDER — FLUCONAZOLE 150 MG PO TABS: 150.0000 mg | ORAL_TABLET | Freq: Once | ORAL | 0 refills | Status: AC

## 2024-04-21 MED ORDER — AMOXICILLIN-POT CLAVULANATE 875-125 MG PO TABS: 1.0000 | ORAL_TABLET | Freq: Two times a day (BID) | ORAL | 0 refills | Status: DC

## 2024-04-28 ENCOUNTER — Encounter: Payer: Self-pay | Admitting: Family Medicine

## 2024-04-28 DIAGNOSIS — F314 Bipolar disorder, current episode depressed, severe, without psychotic features: Secondary | ICD-10-CM

## 2024-04-28 DIAGNOSIS — Z79899 Other long term (current) drug therapy: Secondary | ICD-10-CM

## 2024-05-02 LAB — BASIC METABOLIC PANEL WITH GFR
BUN/Creatinine Ratio: 14 (ref 9–23)
BUN: 9 mg/dL (ref 6–20)
CO2: 23 mmol/L (ref 20–29)
Calcium: 8.8 mg/dL (ref 8.7–10.2)
Chloride: 106 mmol/L (ref 96–106)
Creatinine, Ser: 0.66 mg/dL (ref 0.57–1.00)
Glucose: 98 mg/dL (ref 70–99)
Potassium: 4.1 mmol/L (ref 3.5–5.2)
Sodium: 142 mmol/L (ref 134–144)
eGFR: 117 mL/min/1.73 (ref >59)

## 2024-05-02 LAB — CBC WITH DIFFERENTIAL/PLATELET
Basophils Absolute: 0 x10E3/uL (ref 0.0–0.2)
Basos: 0 %
EOS (ABSOLUTE): 0 x10E3/uL (ref 0.0–0.4)
Eos: 0 %
Hematocrit: 41.7 % (ref 34.0–46.6)
Hemoglobin: 12.9 g/dL (ref 11.1–15.9)
Immature Grans (Abs): 0 x10E3/uL (ref 0.0–0.1)
Immature Granulocytes: 0 %
Lymphocytes Absolute: 2.2 x10E3/uL (ref 0.7–3.1)
Lymphs: 24 %
MCH: 27.7 pg (ref 26.6–33.0)
MCHC: 30.9 g/dL — ABNORMAL LOW (ref 31.5–35.7)
MCV: 90 fL (ref 79–97)
Monocytes Absolute: 0.6 x10E3/uL (ref 0.1–0.9)
Monocytes: 7 %
Neutrophils Absolute: 6.3 x10E3/uL (ref 1.4–7.0)
Neutrophils: 69 %
Platelets: 432 x10E3/uL (ref 150–450)
RBC: 4.66 x10E6/uL (ref 3.77–5.28)
RDW: 12.5 % (ref 11.7–15.4)
WBC: 9.2 x10E3/uL (ref 3.4–10.8)

## 2024-05-02 LAB — T4, FREE: Free T4: 1.16 ng/dL (ref 0.82–1.77)

## 2024-05-02 LAB — LITHIUM LEVEL: Lithium Lvl: 0.1 mmol/L — ABNORMAL LOW (ref 0.5–1.2)

## 2024-05-02 LAB — T3: T3, Total: 123 ng/dL (ref 71–180)

## 2024-05-02 LAB — TSH: TSH: 5.28 u[IU]/mL — ABNORMAL HIGH (ref 0.450–4.500)

## 2024-05-06 ENCOUNTER — Ambulatory Visit: Payer: Self-pay | Admitting: Family Medicine

## 2024-05-12 ENCOUNTER — Encounter: Payer: Self-pay | Admitting: Family Medicine

## 2024-05-12 DIAGNOSIS — G8929 Other chronic pain: Secondary | ICD-10-CM

## 2024-05-12 DIAGNOSIS — M450 Ankylosing spondylitis of multiple sites in spine: Secondary | ICD-10-CM

## 2024-05-13 MED ORDER — TRAMADOL HCL 50 MG PO TABS: 50.0000 mg | ORAL_TABLET | Freq: Three times a day (TID) | ORAL | 0 refills | Status: DC | PRN

## 2024-06-15 ENCOUNTER — Other Ambulatory Visit (HOSPITAL_COMMUNITY): Payer: Self-pay

## 2024-06-15 ENCOUNTER — Telehealth: Payer: Self-pay

## 2024-06-15 NOTE — Telephone Encounter (Signed)
 Pharmacy Patient Advocate Encounter   Received notification from Onbase that prior authorization for Tramadol  HCL 50 tabs is required/requested.   Insurance verification completed.   The patient is insured through [facility] MEDICAID.   Per test claim: PA required; PA submitted to above mentioned insurance via Latent Key/confirmation #/EOC [id number] Status is pending

## 2024-06-22 ENCOUNTER — Other Ambulatory Visit: Payer: Self-pay | Admitting: Family Medicine

## 2024-06-22 DIAGNOSIS — I6930 Unspecified sequelae of cerebral infarction: Secondary | ICD-10-CM

## 2024-06-24 NOTE — Telephone Encounter (Signed)
 Requested Prescriptions  Pending Prescriptions Disp Refills   atorvastatin  (LIPITOR) 40 MG tablet [Pharmacy Med Name: ATORVASTATIN  40MG  TABLETS] 90 tablet 0    Sig: TAKE 1 TABLET(40 MG) BY MOUTH DAILY     Cardiovascular:  Antilipid - Statins Failed - 06/24/2024  9:33 AM      Failed - Lipid Panel in normal range within the last 12 months    Cholesterol, Total  Date Value Ref Range Status  02/07/2023 83 (L) 100 - 199 mg/dL Final   LDL Cholesterol (Calc)  Date Value Ref Range Status  10/14/2018 93 mg/dL (calc) Final    Comment:    Reference range: <100 . Desirable range <100 mg/dL for primary prevention;   <70 mg/dL for patients with CHD or diabetic patients  with > or = 2 CHD risk factors. SABRA LDL-C is now calculated using the Martin-Hopkins  calculation, which is a validated novel method providing  better accuracy than the Friedewald equation in the  estimation of LDL-C.  Gladis APPLETHWAITE et al. SANDREA. 7986;689(80): 2061-2068  (http://education.[employer].com/faq/FAQ164)    LDL Chol Calc (NIH)  Date Value Ref Range Status  02/07/2023 34 0 - 99 mg/dL Final   HDL  Date Value Ref Range Status  02/07/2023 32 (L) >39 mg/dL Final   Triglycerides  Date Value Ref Range Status  02/07/2023 82 0 - 149 mg/dL Final         Passed - Patient is not pregnant      Passed - Valid encounter within last 12 months    Recent Outpatient Visits           4 months ago Gross hematuria   [place] [facility] [place] [facility] Marsa PARAS, DO   9 months ago Skin infection   [place] [facility] [place], Marsa PARAS, [place]

## 2024-07-10 ENCOUNTER — Telehealth: Admitting: Family Medicine

## 2024-07-10 ENCOUNTER — Encounter: Payer: Self-pay | Admitting: Family Medicine

## 2024-07-10 DIAGNOSIS — J101 Influenza due to other identified influenza virus with other respiratory manifestations: Secondary | ICD-10-CM | POA: Diagnosis not present

## 2024-07-10 DIAGNOSIS — J0110 Acute frontal sinusitis, unspecified: Secondary | ICD-10-CM

## 2024-07-10 MED ORDER — OSELTAMIVIR PHOSPHATE 75 MG PO CAPS: 75.0000 mg | ORAL_CAPSULE | Freq: Two times a day (BID) | ORAL | 0 refills | Status: AC

## 2024-07-10 MED ORDER — AZITHROMYCIN 250 MG PO TABS: ORAL_TABLET | ORAL | 0 refills | Status: AC

## 2024-07-10 MED ORDER — ALBUTEROL SULFATE HFA 108 (90 BASE) MCG/ACT IN AERS: 2.0000 | INHALATION_SPRAY | RESPIRATORY_TRACT | 2 refills | Status: AC | PRN

## 2024-07-10 MED ORDER — BENZONATATE 100 MG PO CAPS: 100.0000 mg | ORAL_CAPSULE | Freq: Three times a day (TID) | ORAL | 0 refills | Status: AC | PRN

## 2024-07-10 NOTE — Progress Notes (Signed)
 "  Subjective:    Patient ID: Leah Osborne, female    DOB: 1986-05-22, 39 y.o.   MRN: [id number]  Leah Osborne is a 39 y.o. female presenting on 07/10/2024 for Influenza   Virtual / Telehealth Encounter - Video Visit via MyChart The purpose of this virtual visit is to provide medical care while limiting exposure to the novel coronavirus (COVID19) for both patient and office staff.  Consent was obtained for remote visit:  Yes.   Answered questions that patient had about telehealth interaction:  Yes.   I discussed the limitations, risks, security and privacy concerns of performing an evaluation and management service by video/telephone. I also discussed with the patient that there may be a patient responsible charge related to this service. The patient expressed understanding and agreed to proceed.  Patient Location: Home Provider Location: [facility] (Office)  Participants in virtual visit: - Patient: Leah Osborne - CMA: Alan Fontana CMA - Provider: Dr Edman   HPI  Discussed the use of AI scribe software for clinical note transcription with the patient, who gave verbal consent to proceed.  History of Present Illness   Leah Osborne is a 39 year old female who presents with flu A symptoms.  Influenza A infection Influenza a symptoms - On third day of symptoms, confirmed by positive home combo test - Symptoms include ear pain described as 'irritated and gunky', throat soreness, and cough - Ibuprofen  provides more effective symptom relief than acetaminophen  - Similar episode occurred last year  Cough management - Uses benzonatate  100 mg three times daily for cough relief  Exposure history - Boyfriend tested positive for influenza A one day apart; does not live with her - Sick contacts         02/20/2024   11:42 AM 08/30/2023    3:37 PM 02/07/2023    8:47 AM  Depression screen PHQ 2/9  Decreased Interest 0 1 0  Down,  Depressed, Hopeless 0 0 0  PHQ - 2 Score 0 1 0  Altered sleeping 0 0 0  Tired, decreased energy 0 2 0  Change in appetite 0 0 0  Feeling bad or failure about yourself  0 0 0  Trouble concentrating 1 1 0  Moving slowly or fidgety/restless 0 0 0  Suicidal thoughts 0 0 0  PHQ-9 Score 1  4  0   Difficult doing work/chores Somewhat difficult Somewhat difficult Not difficult at all     Data saved with a previous flowsheet row definition       02/20/2024   11:43 AM 08/30/2023    3:37 PM 02/07/2023    8:47 AM 11/26/2022    3:38 PM  GAD 7 : Generalized Anxiety Score  Nervous, Anxious, on Edge 3  3  0  0   Control/stop worrying 2  2  0  0   Worry too much - different things 2  2  0  0   Trouble relaxing 2  2  0  0   Restless 1  2  1   0   Easily annoyed or irritable 2  1  0  0   Afraid - awful might happen 1  0  0  0   Total GAD 7 Score 13 12 1  0  Anxiety Difficulty Somewhat difficult Somewhat difficult Not difficult at all Not difficult at all     Data saved with a previous flowsheet row definition    Social History[1]  Review  of Systems Per HPI unless specifically indicated above     Objective:    LMP 06/27/2021   Wt Readings from Last 3 Encounters:  04/17/24 230 lb (104.3 kg)  04/02/24 220 lb (99.8 kg)  03/20/24 222 lb (100.7 kg)     Physical Exam  Note examination was completely remotely via video observation objective data only  Gen - well-appearing, no acute distress or apparent pain, comfortable HEENT - eyes appear clear without discharge or redness Heart/Lungs - cannot examine virtually - coughing Abd - cannot examine virtually  Skin - face visible today- no rash Neuro - awake, alert, oriented Psych - not anxious appearing   Results for orders placed or performed in visit on 04/28/24  CBC with Differential/Platelet   Collection Time: 05/01/24  2:09 PM  Result Value Ref Range   WBC 9.2 3.4 - 10.8 x10E3/uL   RBC 4.66 3.77 - 5.28 x10E6/uL   Hemoglobin 12.9  11.1 - 15.9 g/dL   Hematocrit 58.2 65.9 - 46.6 %   MCV 90 79 - 97 fL   MCH 27.7 26.6 - 33.0 pg   MCHC 30.9 (L) 31.5 - 35.7 g/dL   RDW 87.4 88.2 - 84.5 %   Platelets 432 150 - 450 x10E3/uL   Neutrophils 69 Not Estab. %   Lymphs 24 Not Estab. %   Monocytes 7 Not Estab. %   Eos 0 Not Estab. %   Basos 0 Not Estab. %   Neutrophils Absolute 6.3 1.4 - 7.0 x10E3/uL   Lymphocytes Absolute 2.2 0.7 - 3.1 x10E3/uL   Monocytes Absolute 0.6 0.1 - 0.9 x10E3/uL   EOS (ABSOLUTE) 0.0 0.0 - 0.4 x10E3/uL   Basophils Absolute 0.0 0.0 - 0.2 x10E3/uL   Immature Granulocytes 0 Not Estab. %   Immature Grans (Abs) 0.0 0.0 - 0.1 x10E3/uL  TSH   Collection Time: 05/01/24  2:09 PM  Result Value Ref Range   TSH 5.280 (H) 0.450 - 4.500 uIU/mL  T4, free   Collection Time: 05/01/24  2:09 PM  Result Value Ref Range   Free T4 1.16 0.82 - 1.77 ng/dL  T3   Collection Time: 05/01/24  2:09 PM  Result Value Ref Range   T3, Total 123 71 - 180 ng/dL  Basic metabolic panel with GFR   Collection Time: 05/01/24  2:09 PM  Result Value Ref Range   Glucose 98 70 - 99 mg/dL   BUN 9 6 - 20 mg/dL   Creatinine, Ser 9.33 0.57 - 1.00 mg/dL   eGFR 882 >40 fO/fpw/8.26   BUN/Creatinine Ratio 14 9 - 23   Sodium 142 134 - 144 mmol/L   Potassium 4.1 3.5 - 5.2 mmol/L   Chloride 106 96 - 106 mmol/L   CO2 23 20 - 29 mmol/L   Calcium  8.8 8.7 - 10.2 mg/dL  Lithium  level   Collection Time: 05/01/24  2:09 PM  Result Value Ref Range   Lithium  Lvl 0.1 (L) 0.5 - 1.2 mmol/L      Assessment & Plan:   Problem List Items Addressed This Visit   None Visit Diagnoses       Influenza A    -  Primary   Relevant Medications   oseltamivir  (TAMIFLU ) 75 MG capsule   benzonatate  (TESSALON ) 100 MG capsule   albuterol  (VENTOLIN  HFA) 108 (90 Base) MCG/ACT inhaler   azithromycin  (ZITHROMAX  Z-PAK) 250 MG tablet     Acute non-recurrent frontal sinusitis       Relevant Medications   oseltamivir  (  TAMIFLU ) 75 MG capsule   benzonatate   (TESSALON ) 100 MG capsule   azithromycin  (ZITHROMAX  Z-PAK) 250 MG tablet        Influenza A Confirmed by home test, third day of symptoms Sick contacts  - Prescribed Tamiflu , twice daily for five days. - Prescribed benzonatate  100 mg, three times a day as needed for cough. - Prescribed albuterol  inhaler, two puffs every four hours as needed. - Advised ibuprofen  for ear pain and pressure. - BACK UP PLAN ONLY - Prescribed Z-Pak for potential secondary infections, given winter weather, she has early symptoms of L ear infection will provide coverage for secondary bacterial infection. Instructed to only take if indicated     No orders of the defined types were placed in this encounter.   Meds ordered this encounter  Medications   oseltamivir  (TAMIFLU ) 75 MG capsule    Sig: Take 1 capsule (75 mg total) by mouth 2 (two) times daily. For 5 days    Dispense:  10 capsule    Refill:  0   benzonatate  (TESSALON ) 100 MG capsule    Sig: Take 1 capsule (100 mg total) by mouth 3 (three) times daily as needed for cough.    Dispense:  30 capsule    Refill:  0   albuterol  (VENTOLIN  HFA) 108 (90 Base) MCG/ACT inhaler    Sig: Inhale 2 puffs into the lungs every 4 (four) hours as needed.    Dispense:  8 g    Refill:  2   azithromycin  (ZITHROMAX  Z-PAK) 250 MG tablet    Sig: Take 2 tabs (500mg  total) on Day 1. Take 1 tab (250mg ) daily for next 4 days.    Dispense:  6 tablet    Refill:  0    Follow up plan: Return if symptoms worsen or fail to improve.   Patient verbalizes understanding with the above medical recommendations including the limitation of remote medical advice.  Specific follow-up and call-back criteria were given for patient to follow-up or seek medical care more urgently if needed.  Total duration of direct patient care provided via video conference: 12 minutes   Marsa Officer, DO [facility] 07/10/2024, 2:36 PM      [1]  Social History Tobacco Use   Smoking status: Never    Passive exposure: Never   Smokeless tobacco: Never  Vaping Use   Vaping status: Never Used  Substance Use Topics   Alcohol use: Not Currently    Alcohol/week: 0.0 standard drinks of alcohol   Drug use: No   "

## 2024-07-11 NOTE — Patient Instructions (Addendum)
" °  Start Tamiflu  (anti-flu medicine) take one capsule 75mg  twice a day for 5 days  Start Tessalon  Perls take 1 capsule up to 3 times a day as needed for cough  Albuterol  AS NEEDED   If not improving within next 3-5 days may consider adding Azithromycin  if concern for bronchitis or ear infection   - Wash hands and cover cough very well to avoid spread of infection - Improve hydration with plenty of clear fluids   If significant worsening with poor fluid intake, worsening fever, difficulty breathing due to coughing, worsening body aches, weakness, or other more concerning symptoms difficulty breathing you can seek treatment at Emergency Department. Also if improved flu symptoms and then worsening days to week later with concerns for bronchitis, productive cough fever chills again we may need to check for possible pneumonia that can occur after the flu      Please schedule a Follow-up Appointment to: Return if symptoms worsen or fail to improve.  If you have any other questions or concerns, please feel free to call the office or send a message through MyChart. You may also schedule an earlier appointment if necessary.  Additionally, you may be receiving a survey about your experience at our office within a few days to 1 week by e-mail or mail. We value your feedback.  Marsa Officer, DO [facility], [place] "

## 2024-07-15 ENCOUNTER — Other Ambulatory Visit: Payer: Self-pay | Admitting: Family Medicine

## 2024-07-15 DIAGNOSIS — G8929 Other chronic pain: Secondary | ICD-10-CM

## 2024-07-15 DIAGNOSIS — M450 Ankylosing spondylitis of multiple sites in spine: Secondary | ICD-10-CM

## 2024-07-16 NOTE — Telephone Encounter (Signed)
 Requested medication (s) are due for refill today: na  Requested medication (s) are on the active medication list: yes  Last refill:  05/13/24 #30 0 refills  Future visit scheduled: no   Notes to clinic:  not delegated per protocol do you want to refill Rx?   Maximum MME cannot be calculated for this prescription. Enter discrete sig details to calculate maximum MME.      Requested Prescriptions  Pending Prescriptions Disp Refills   traMADol  (ULTRAM ) 50 MG tablet [Pharmacy Med Name: TRAMADOL  50MG  TABLETS] 30 tablet     Sig: TAKE 1 TABLET(50 MG) BY MOUTH EVERY 8 HOURS AS NEEDED     Not Delegated - Analgesics:  Opioid Agonists Failed - 07/16/2024  1:14 PM      Failed - This refill cannot be delegated      Failed - Urine Drug Screen completed in last 360 days      Passed - Valid encounter within last 3 months    Recent Outpatient Visits           6 days ago Influenza A   [place] [facility] [place], Marsa PARAS, DO   4 months ago Gross hematuria   [place] [facility] Marsa PARAS, DO   10 months ago Skin infection   [place] [facility] [place], Marsa PARAS, [place]

## 2024-07-20 ENCOUNTER — Telehealth: Payer: Self-pay

## 2024-07-20 ENCOUNTER — Other Ambulatory Visit (HOSPITAL_COMMUNITY): Payer: Self-pay

## 2024-07-20 DIAGNOSIS — G8929 Other chronic pain: Secondary | ICD-10-CM

## 2024-07-20 DIAGNOSIS — M450 Ankylosing spondylitis of multiple sites in spine: Secondary | ICD-10-CM

## 2024-07-20 NOTE — Telephone Encounter (Signed)
 Pharmacy Patient Advocate Encounter   Received notification from [facility] that prior authorization for Tramadol  Hcl 50 is required/requested.   Insurance verification completed.   The patient is insured through [facility] MEDICAID.   Per test claim: reasons for previous denial from 06/15/24 have not been addressed.

## 2024-07-21 NOTE — Telephone Encounter (Signed)
 Ankylosing Spondylitis of Spine M45.0 Chronic Low Back Pain M54.50
